# Patient Record
Sex: Male | Born: 1945
Health system: Southern US, Community
[De-identification: ages and names within clinical notes are randomized; demographics above are authoritative.]

## PROBLEM LIST (undated history)

## (undated) DIAGNOSIS — M1990 Unspecified osteoarthritis, unspecified site: Secondary | ICD-10-CM

## (undated) DIAGNOSIS — I10 Essential (primary) hypertension: Secondary | ICD-10-CM

## (undated) DIAGNOSIS — I2510 Atherosclerotic heart disease of native coronary artery without angina pectoris: Secondary | ICD-10-CM

## (undated) DIAGNOSIS — R42 Dizziness and giddiness: Secondary | ICD-10-CM

## (undated) DIAGNOSIS — I499 Cardiac arrhythmia, unspecified: Secondary | ICD-10-CM

## (undated) DIAGNOSIS — IMO0001 Reserved for inherently not codable concepts without codable children: Secondary | ICD-10-CM

## (undated) DIAGNOSIS — I739 Peripheral vascular disease, unspecified: Secondary | ICD-10-CM

## (undated) DIAGNOSIS — K219 Gastro-esophageal reflux disease without esophagitis: Secondary | ICD-10-CM

## (undated) DIAGNOSIS — C61 Malignant neoplasm of prostate: Secondary | ICD-10-CM

## (undated) DIAGNOSIS — E785 Hyperlipidemia, unspecified: Secondary | ICD-10-CM

## (undated) DIAGNOSIS — I6529 Occlusion and stenosis of unspecified carotid artery: Secondary | ICD-10-CM

## (undated) DIAGNOSIS — M75100 Unspecified rotator cuff tear or rupture of unspecified shoulder, not specified as traumatic: Secondary | ICD-10-CM

## (undated) DIAGNOSIS — Z87442 Personal history of urinary calculi: Secondary | ICD-10-CM

## (undated) DIAGNOSIS — I259 Chronic ischemic heart disease, unspecified: Secondary | ICD-10-CM

## (undated) HISTORY — DX: Personal history of urinary calculi: Z87.442

## (undated) HISTORY — DX: Hyperlipidemia, unspecified: E78.5

## (undated) HISTORY — DX: Essential (primary) hypertension: I10

## (undated) HISTORY — DX: Atherosclerotic heart disease of native coronary artery without angina pectoris: I25.10

## (undated) HISTORY — PX: HERNIA REPAIR: SHX51

## (undated) HISTORY — PX: APPENDECTOMY: SHX54

## (undated) HISTORY — DX: Chronic ischemic heart disease, unspecified: I25.9

## (undated) HISTORY — DX: Unspecified rotator cuff tear or rupture of unspecified shoulder, not specified as traumatic: M75.100

## (undated) HISTORY — DX: Occlusion and stenosis of unspecified carotid artery: I65.29

## (undated) HISTORY — PX: CAROTID ENDARTERECTOMY: SUR193

## (undated) HISTORY — DX: Reserved for inherently not codable concepts without codable children: IMO0001

## (undated) HISTORY — PX: SHOULDER SURGERY: SHX246

---

## 1999-10-21 ENCOUNTER — Emergency Department (HOSPITAL_COMMUNITY): Admission: EM | Admit: 1999-10-21 | Discharge: 1999-10-22 | Payer: Self-pay | Admitting: Emergency Medicine

## 1999-10-23 ENCOUNTER — Inpatient Hospital Stay (HOSPITAL_COMMUNITY): Admission: EM | Admit: 1999-10-23 | Discharge: 1999-10-26 | Payer: Self-pay | Admitting: Gastroenterology

## 1999-11-18 ENCOUNTER — Ambulatory Visit (HOSPITAL_COMMUNITY): Admission: RE | Admit: 1999-11-18 | Discharge: 1999-11-18 | Payer: Self-pay | Admitting: Gastroenterology

## 2000-04-14 ENCOUNTER — Emergency Department (HOSPITAL_COMMUNITY): Admission: EM | Admit: 2000-04-14 | Discharge: 2000-04-14 | Payer: Self-pay | Admitting: Emergency Medicine

## 2002-02-02 ENCOUNTER — Encounter (INDEPENDENT_AMBULATORY_CARE_PROVIDER_SITE_OTHER): Payer: Self-pay | Admitting: Specialist

## 2002-02-02 ENCOUNTER — Ambulatory Visit (HOSPITAL_COMMUNITY): Admission: RE | Admit: 2002-02-02 | Discharge: 2002-02-02 | Payer: Self-pay | Admitting: Gastroenterology

## 2002-04-26 HISTORY — PX: CORONARY ARTERY BYPASS GRAFT: SHX141

## 2002-05-02 ENCOUNTER — Encounter: Payer: Self-pay | Admitting: Cardiology

## 2002-05-02 ENCOUNTER — Inpatient Hospital Stay (HOSPITAL_COMMUNITY): Admission: AD | Admit: 2002-05-02 | Discharge: 2002-05-09 | Payer: Self-pay | Admitting: Cardiology

## 2002-05-03 ENCOUNTER — Encounter: Payer: Self-pay | Admitting: Cardiothoracic Surgery

## 2002-05-03 HISTORY — PX: CARDIAC CATHETERIZATION: SHX172

## 2002-05-04 ENCOUNTER — Encounter: Payer: Self-pay | Admitting: Cardiothoracic Surgery

## 2002-05-05 ENCOUNTER — Encounter: Payer: Self-pay | Admitting: Cardiothoracic Surgery

## 2002-05-06 ENCOUNTER — Encounter: Payer: Self-pay | Admitting: Cardiothoracic Surgery

## 2002-05-09 ENCOUNTER — Encounter: Payer: Self-pay | Admitting: Cardiothoracic Surgery

## 2002-05-31 ENCOUNTER — Encounter (HOSPITAL_COMMUNITY): Admission: RE | Admit: 2002-05-31 | Discharge: 2002-08-29 | Payer: Self-pay | Admitting: Cardiology

## 2002-08-12 HISTORY — PX: DOPPLER ECHOCARDIOGRAPHY: SHX263

## 2002-08-30 ENCOUNTER — Encounter (HOSPITAL_COMMUNITY): Admission: RE | Admit: 2002-08-30 | Discharge: 2002-11-28 | Payer: Self-pay | Admitting: Cardiology

## 2004-02-09 ENCOUNTER — Emergency Department (HOSPITAL_COMMUNITY): Admission: EM | Admit: 2004-02-09 | Discharge: 2004-02-09 | Payer: Self-pay | Admitting: Emergency Medicine

## 2004-03-13 ENCOUNTER — Encounter: Admission: RE | Admit: 2004-03-13 | Discharge: 2004-03-13 | Payer: Self-pay | Admitting: Family Medicine

## 2004-03-18 ENCOUNTER — Ambulatory Visit (HOSPITAL_COMMUNITY): Admission: RE | Admit: 2004-03-18 | Discharge: 2004-03-18 | Payer: Self-pay | Admitting: Urology

## 2004-06-25 ENCOUNTER — Ambulatory Visit (HOSPITAL_COMMUNITY): Admission: RE | Admit: 2004-06-25 | Discharge: 2004-06-25 | Payer: Self-pay | Admitting: Gastroenterology

## 2004-06-25 ENCOUNTER — Encounter (INDEPENDENT_AMBULATORY_CARE_PROVIDER_SITE_OTHER): Payer: Self-pay | Admitting: *Deleted

## 2006-08-10 HISTORY — PX: CARDIOVASCULAR STRESS TEST: SHX262

## 2008-09-23 ENCOUNTER — Inpatient Hospital Stay (HOSPITAL_COMMUNITY): Admission: AD | Admit: 2008-09-23 | Discharge: 2008-09-28 | Payer: Self-pay | Admitting: Internal Medicine

## 2008-09-23 ENCOUNTER — Encounter: Payer: Self-pay | Admitting: Emergency Medicine

## 2010-06-26 ENCOUNTER — Ambulatory Visit: Payer: Self-pay | Admitting: Cardiology

## 2010-12-27 ENCOUNTER — Ambulatory Visit (INDEPENDENT_AMBULATORY_CARE_PROVIDER_SITE_OTHER): Payer: BC Managed Care – PPO | Admitting: Cardiology

## 2010-12-27 DIAGNOSIS — Z79899 Other long term (current) drug therapy: Secondary | ICD-10-CM

## 2010-12-27 DIAGNOSIS — E119 Type 2 diabetes mellitus without complications: Secondary | ICD-10-CM

## 2010-12-27 DIAGNOSIS — E7800 Pure hypercholesterolemia, unspecified: Secondary | ICD-10-CM

## 2010-12-27 DIAGNOSIS — Z951 Presence of aortocoronary bypass graft: Secondary | ICD-10-CM

## 2011-03-11 NOTE — H&P (Signed)
NAMESTANISLAUS, Duke NO.:  [id number]   MEDICAL RECORD NO.:  [id number]          PATIENT TYPE:  INP   LOCATION:  1303                         FACILITY:  [facility]   PHYSICIAN:  Vania Rea, M.D. DATE OF BIRTH:  [birth year]   DATE OF ADMISSION:  09/24/2008  DATE OF DISCHARGE:                              HISTORY & PHYSICAL   PRIMARY CARE PHYSICIAN:  Marjory Lies, M.D., [facility]   CHIEF COMPLAINT:  Inflammation of the left forearm.   HISTORY OF PRESENT ILLNESS:  This is a 65 year old Caucasian gentleman  with a history of diabetes who visited Urgent Care on September 22, 2008  because of acute painful swelling of the left elbow. At the Urgent Care,  patient said he had fluid drawn from his left elbow and sent for  cultures. He says blood cultures were also done. He was given an  injection of antibiotics and sent home with Doxycycline. The follow day  Friday, he felt a lot better but on Saturday, his hand got significantly  worse with redness and swelling spreading up his left arm and down his  hand and forearm. The patient went to the [facility] emergency room  where he was seen and evaluated and found to have cellulitis of the left  upper extremity and the Hospitalist Service was called to assist with  admission. The patient denies any fevers or chills. He denies any  history of trauma to the area.   PAST MEDICAL HISTORY:  1. Diabetes type 2.  2. Hypertension.  3. Coronary artery disease, status post CABG 6 years ago.  4. Hyperlipidemia.   MEDICATIONS:  1. Lantus 42 units at bedtime.  2. Avandamet 4/100 twice daily.  3. Aspirin 81 mg daily.  4. Crestor 20 mg at bedtime.  5. Glyburide 10 mg twice daily.  6. HCTZ 12.5 mg daily.  7. Lotrel 5/20 daily.  8. Metoprolol 50 mg twice daily.  9. Nexium 40 mg daily.  10.Doxycycline 100 mg twice daily.   ALLERGIES:  CODEINE, SULFA.   SOCIAL HISTORY:  He used to smoke 1 pack per day and  did that for about  30 years but stopped after his CABG 6 1/2 years ago. Denies alcohol or  illicit drug use. 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   FAMILY HISTORY:  Coronary artery disease in both parents. Diabetes is  his sister.   PAST SURGICAL HISTORY:  1. Right carotid endarterectomy 14 years ago.  2. CABG 6 1/2 years ago.  3. Appendectomy at the age of 42.   REVIEW OF SYSTEMS:  Other than noted above, a 10 point review of systems  is unremarkable.   PHYSICAL EXAMINATION:  GENERAL:  A very pleasant middle aged Caucasian  gentleman who looks older than his stated age. No acute distress.  VITAL SIGNS:  Temperature 98.7, pulse 81, respiratory rate 18, blood  pressure 143/78. He is saturating at 97% on room air.  HEENT:  Pupils are equal and round. Mucous membranes are pink and  anicteric. He appears to be  mildly dehydration.  NECK:  He has no adenopathy, thyromegaly, or jugular venous distention.  He does have a right carotid endarterectomy scar.  CHEST:  Clear to auscultation bilaterally.  CARDIOVASCULAR:  Regular rhythm without murmur.  ABDOMEN:  Obese, soft, nontender. He has no masses.  EXTREMITIES:  Without edema. Dorsalis pedis pulses are 1+ and equal  bilaterally.  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 His left elbow, he has obvious swelling over the left  elbow, status post status post left olecranon bursitis. He has  inflammation on the inner aspect of the left arm, extending from the  elbow up to the mid arm. The inflammation also extends below the elbow  on the inner aspect of the forearm, down midway to the wrist, 2/3 way  down to the wrist.  NEUROLOGIC:  Cranial nerves 2-12 are grossly intact. He has no focal  neurological deficits.   LABORATORY DATA:  White count is normal at 9.0. In fact, his entire  serum chemistry is normal. He does not have granulocytes. Sodium 138,  potassium 3.4, BUN  26, creatinine 1.3, calcium 9.9.   DIAGNOSTIC STUDIES:  X-ray of the elbow shows swelling of the olecranon  bursa, consistent with bursitis. Calcification of the medial epicondyle,  consistent with chronic medial epicondylitis. No fracture.   ASSESSMENT:  1. Cellulitis of the left upper extremity.  2. Olecranon bursitis.  3. Diabetes mellitus type 2, controlled.  4. Hypertension, fair control.  5. History of hyperlipidemia.  6. Hypokalemia.   PLAN:  Will bring this gentleman in to continue intravenous antibiotics.  He has already had Vancomycin at the [facility] emergency room. He has  been on antibiotics for a couple of days. He is afebrile and without  leukocytes.  Further blood culture at this time will probably be of low yield. Will  add Ciprofloxacin and Clindamycin to his Vancomycin and will followup  cultures at the Urgent Care. Will discontinue Avandamet for the time  being. Add sliding scale to his regimen and Avandamet can likely be  restarted at discharge. Other plans as per order.      Vania Rea, M.D.  Electronically Signed     LC/MEDQ  D:  09/24/2008  T:  09/24/2008  Job:  [id number]   cc:   Marjory Lies, M.D.  Fax: [phone]

## 2011-03-11 NOTE — Discharge Summary (Signed)
[name], [name] NO.:  [id number]   MEDICAL RECORD NO.:  [id number]          PATIENT TYPE:  INP   LOCATION:  1303                         FACILITY:  [facility]   PHYSICIAN:  Monte Fantasia, MD  DATE OF BIRTH:  1946-05-04   DATE OF ADMISSION:  09/23/2008  DATE OF DISCHARGE:                               DISCHARGE SUMMARY   PRIMARY CARE PHYSICIAN:  Marjory Lies, M.D., [facility].   DISCHARGE DIAGNOSES:  1. Olecranon bursitis.  2. Left cellulitis.  3. Diabetes type 2.  4. Hypertension.  5. Coronary artery disease status post coronary artery bypass 6 years      ago.  6. Hyperlipidemia.   DISCHARGE MEDICATIONS:  1. Linezolid 600 mg p.o. every 12 hours for 2 weeks.  2. Norvasc 5 mg p.o. every day.  3. Aspirin 81 mg p.o. every day.  4. Benazepril/Lotensin 20 mg p.o. every day.  5. Glyburide 10 mg p.o. b.i.d.  6. Hydrochlorothiazide 12.5 mg p.o. every day.  7. Lantus 42 units subcutaneously nightly.  8. NovoLog 11 units subcutaneously t.i.d.  9. Metoprolol 50 mg p.o. b.i.d.  10.Protonix 40 mg p.o. every 12.  11.Lovastatin Crestor 20 mg p.o. every day.  12.Senna 1 tablet p.o. nightly.  13.Avandamet 400 mg 1 tab p.o. every b.i.d.   CHIEF COMPLAINT:  Inflammation of the left forearm.   COURSE DURING THE HOSPITAL STAY:  This 65 year old Caucasian gentleman  patient with a history of diabetes who presented to urgent care on  September 22, 2008, because of acute painful swelling of the left elbow.  At the urgent care, patient said that he had fluid drawn from his left  elbow and sent for cultures.  States that the blood cultures were also  done.  Also was given injection of antibiotics and sent home with  doxycycline.  Following there on Friday, he felt better, however, his  hand got significantly worse with redness and swelling spreading up  through his left arm down to his hand and forearm.  Patient went to the  [facility] Emergency Room for which  he was seen and evaluated and found  to have cellulitis of his left upper extremity and Hospitalists service  was called in to assist with admission.  Patient on admission was  started with IV antibiotics with vancomycin with ciprofloxacin and  clindamycin to his vancomycin.  Patient's blood cultures, however, has  been no growth for last 5 days and has symptomatically improved with  decrease in his swelling of his left forearm since admission gradually.  At present patient has no pain or swelling of his left forearm and has  been afebrile with normal white count.  Patient is stable and can be  discharged home.   PAST MEDICAL HISTORY:  1. Diabetes type 2.  2. Hypertension.  3. Coronary artery disease.  4. Hyperlipidemia.   ALLERGIES:  1. CODEINE.  2. SULFA.   SURGICAL HISTORY:  1. Right carotid endarterectomy 14 years ago.  2. Coronary artery bypass 6 1/2 years ago.  3. Appendectomy done at the age of 65.   INVESTIGATIONS  DONE DURING HOSPITAL STAY:  X-ray of the elbow done on  September 23, 2008.   IMPRESSION:  There is a swelling of the olecranon bursa consistent with  bursitis.  No fracture or dislocation, calcification or medial  epicondyle consistent with chronic medial epicondylitis.  1. Findings consistent with olecranon bursitis.  2. No evidence of fracture.  3. Chronic medial epicondylitis.   LABORATORIES ON DISCHARGE:  Total WBC 6.5, hemoglobin 13.6, hematocrit  14.9, platelet of 199.  Sodium 138, potassium 3.6, chloride 105, bicarb  is 36, glucose 153, BUN is 13, creatinine 0.9, calcium is 8.9.  Blood  cultures have been no growth for last 5 days.   ASSESSMENT AND PLAN:  Plan to discharge the patient home with p.o.  linezolid for olecranon bursitis.  Need to continue the p.o. antibiotic  for at least 2 weeks.  Will continue the rest of the medications as per  the discharge medications dictated as above.  The patient needs also to  follow up his primary care  physician within next week.      Monte Fantasia, MD  Electronically Signed     MP/MEDQ  D:  09/27/2008  T:  09/27/2008  Job:  [id number]   cc:   Marjory Lies, M.D.  Fax: [phone]

## 2011-03-11 NOTE — Discharge Summary (Signed)
[name], [name] NO.:  [id number]   MEDICAL RECORD NO.:  [id number]          PATIENT TYPE:  INP   LOCATION:  1303                         FACILITY:  [facility]   PHYSICIAN:  Monte Fantasia, MD  DATE OF BIRTH:  1946-09-08   DATE OF ADMISSION:  09/23/2008  DATE OF DISCHARGE:                               DISCHARGE SUMMARY   ADDENDUM:   PRIMARY CARE PHYSICIAN:  Marjory Lies, M.D., [facility].   DISCHARGE DIAGNOSES:  1. Olecranon bursitis.  2. Left elbow cellulitis.  3. Diabetes, type 2.  4. Hypertension.  5. Coronary artery disease status post coronary artery bypass 6 years      ago.  6. Hyperlipidemia.   DISCHARGE MEDICATIONS:  1. Levofloxacin 500 mg p.o. daily for 2 weeks.  2. Norvasc 5 mg p.o. daily.  3. Aspirin 81 mg p.o. daily.  4. Benazepril 20 mg p.o. daily.  5. Glyburide 10 mg p.o. b.i.d.  6. Hydrochlorothiazide 12.5 mg p.o. daily.  7. Lantus 42 units subcutaneously q.h.s.  8. NovoLog 11 units subcutaneous t.i.d. with meals.  9. Metoprolol 50 mg p.o. b.i.d.  10.Protonix 40 mg p.o. every 12 hours.  11.Lovastatin 20 mg p.o. daily.  12.Avandamet 4/100 mg 1 tablet p.o. b.i.d.   COURSE OF DISCHARGE DURING THE HOSPITAL STAY:  As dictated prior,  patient had had an aspiration of the olecranon bursa at the [facility]  Emergency Room which we had the culture report today.  The culture final  report states has a growth of Staph aureus sensitive to oxacillin 0.5,  cefazolin sensitivity, sensitive gentamicin, sensitive less than or  equal to 0.5, ciprofloxacin sensitive 1, levofloxacin sensitive 0.5,  moxifloxacin sensitive less than or equal to 0.25, trimethoprim sulfa  sensitive less than or equal to 10, vancomycin sensitive less than or  equal to 0.5, erythromycin resistant more than or equal to 8, linezolid  sensitive 4, quinupristin/dalfopristin sensitive less than or equal to  0.25, rifampin sensitive less than or equal to 0.5,  tetracycline  sensitive less than or equal to 1.   ASSESSMENT AND PLAN:  We will discharge the patient home with p.o.  levofloxacin as per the sensitivities for the olecranon bursitis for 2  weeks instead of the linezolid as dictated in the prior discharge  summary.  Patient has been counseled and has been requested to see his  primary care physician next week.  We will discharge the patient as per  the discharge medications as dictated above.      Monte Fantasia, MD  Electronically Signed     MP/MEDQ  D:  09/28/2008  T:  09/28/2008  Job:  [id number]   cc:   Marjory Lies, M.D.  Fax: [phone]

## 2011-03-14 NOTE — Consult Note (Signed)
[place]  Patient:    Connor, Duke Visit Number: [id number] MRN: [id number]          Service Type: MED Location: 2000 2030 01 Attending Physician:  [name] Dictated by:   [name], M.D. Proc. Date: 05/03/02 Admit Date:  05/02/2002   CC:         [name]., M.D.  [name], M.D., [facility]  CVTS Office  [facility]   Consultation Report  PHYSICIANS: Requesting physician: [name]., M.D. Primary care physician: [name], M.D.  REASON FOR CONSULTATION:  Left main and three-vessel coronary artery disease, unstable angina.  CHIEF COMPLAINT:  Chest pain and shortness of breath.  HISTORY OF PRESENT ILLNESS:  I was asked by Dr. Elease Duke to evaluate this 65 year old white male for potential surgical coronary revascularization for recently diagnosed left main and three-vessel coronary artery disease.  The patient has had exertional chest pain for several weeks, but this has recently progressed to resting angina and chest pain with minimal exertion associated with some lightheadedness, shortness of breath, and diaphoresis.  The patient was seen at the cardiology office yesterday where he had chest pain in the waiting room, and he was admitted.  Cardiac enzymes were drawn and were negative.  She had nonspecific ST segment changes, and he was placed on heparin and nitroglycerin.  [name] underwent cardiac catheterization by Dr. Elease Duke today which demonstrated a greater than 90% left main stenosis, 90% stenosis of the LAD diagonal, 80% stenosis of the right coronary artery, and an ejection fraction of 45%.  Left ventricular end-diastolic pressure was 30 mmHg.  Based on the patients bad coronary anatomy and symptoms of unstable angina, he was felt to be a potential candidate for surgical coronary revascularization.  PAST MEDICAL HISTORY: 1. Type 2  diabetes mellitus x 7 years. 2. Barretts esophagus. 3. Status post right carotid endarterectomy seven years ago. 4. Status post hernia repair and appendectomy. 5. History of kidney stones.  ALLERGIES:  He is allergic to SULFA and intolerant of CODEINE.  CURRENT MEDICATIONS: 1. Glucophage 500 mg t.i.d. 2. Lotrel 5/20 mg q.a.m. 3. Nexium 40 mg p.o. q.d. 4. Aspirin 1 q.d. 5. Plavix 75 mg daily.  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  He is single and has children.  He lives by himself.  He smokes one-half to one pack of cigarettes per day and denies alcohol use.  FAMILY HISTORY:  The patients father died of a myocardial infarction at age 55.  His mother died at age 33 of a myocardial infarction.  One brother has had coronary bypass surgery.  REVIEW OF SYSTEMS:  The patient denies any constitutional symptoms of weight loss, fever, or night sweats.  Pulmonary review is positive for mild intermittent productive cough, negative for history of recent pneumonia, bronchitis, hemoptysis, or chest trauma.  Cardiac review is positive for chest pain, dyspnea on exertion; negative for orthopnea, PND, or history of cardiac murmur or rheumatic fever as a child.  GI review is positive for Barretts esophagus controlled with Nexium, negative for peptic ulcer disease.  He has a history of diverticular disease and was admitted to hospital for blood per rectum three years ago without recent recurrence.  Urologic review is positive for 20 to 25 kidney stones being passed, the last of which was three years ago; negative for prostatism.  Skin review is negative for  skin cancer or chronic skin lesions.  He has no diabetic foot ulcers.  ENT review is negative for dizziness or change in vision.  He has had some diminished vision in the right eye prior to the right carotid endarterectomy.  This is now stable.  He has full dentures.  Vascular review is negative  for DVT, claudication. Musculoskeletal review is negative for fractures, significant arthritis, or gout.  Neurologic review is negative for seizure, stroke, or syncope.  PHYSICAL EXAMINATION:  VITAL SIGNS:  He is 5 feet 11 inches, weighs 171 pounds.  Blood pressure 110/60, heart rate 74 and regular.  GENERAL:  Middle-aged white male in his hospital room who appears to be somewhat sedated and slow to respond to questions.  He is surrounded by his family.  He is in no distress.  HEENT:  Normocephalic.  EOMs intact.  Full dental plates, upper and lower. Pharynx clear.  NECK:  Without JVD, thyromegaly.  He has a well-healed right carotid endarterectomy incision.  No bruit.  LUNGS:  Clear to auscultation.  There is no thoracic deformity.  CARDIAC:  Regular rate and rhythm without S3 gallop, murmur, or rub.  ABDOMEN:  Soft, nontender, nondistended, without organomegaly or abdominal bruit.  RECTAL:  Exam is deferred.  EXTREMITIES:  No clubbing, edema, cyanosis, or tenderness.  Peripheral pulses show 2+ femoral, 1+ pedal.  No venous insufficiency of the lower extremities.  NEUROLOGIC:  The patient is alert and oriented with full motor function.  LYMPHATIC:  No palpable adenopathy of the cervical, supraclavicular, or axillary regions.  DIAGNOSTIC DATA:  I reviewed his coronary arteriograms which show severe left main stenosis with three-vessel disease.  His hematocrit is 44%.  His creatinine is 0.8, blood sugar 186.  PT/PTT prior to heparin were normal.  His platelet count is 218,000.  IMPRESSION AND RECOMMENDATIONS:  A 65 year old male who has been admitted with unstable angina and found to have severe left main stenosis and three-vessel  disease.  He was placed on Plavix on admission.  He would benefit from surgical coronary revascularization.  We will plan on grafting the left anterior descending artery, diagonal,  obtuse marginal, and distal right coronary tomorrow  morning.  I have discussed the indications and expected benefits of coronary bypass surgery with the patient.  I reviewed the major alternatives to surgical therapy for treatment of his severe coronary disease and the expected outcome of those alternative therapies.  I discussed the risks associated with this operation with the patient including the risks of MI, CVA, bleeding, blood transfusion requirement, infection, and death.  I have discussed the major details of the proposed operation including the location of the surgical incisions, the choice of conduits for grafting, and the expected postoperative hospital recovery.  The patient understands these aspects of the procedure and agrees to proceed with the operation as planned under what I feel is an informed consent.  Thank you very much for this consultation. Dictated by:   [name], M.D. Attending Physician:  [name] DD:  05/03/02 TD:  05/03/02 Job: [id number]

## 2011-03-14 NOTE — Procedures (Signed)
[facility]  Patient:    [name], [name] Visit Number: [id number] MRN: [id number]          Service Type: END Location: ENDO Attending Physician:  Rich Brave Dictated by:   Florencia Reasons, M.D. Proc. Date: 02/02/02 Admit Date:  02/02/2002   CC:         Delorse Lek, M.D.   Procedure Report  PROCEDURE:  Upper endoscopy with biopsies.  INDICATIONS FOR PROCEDURE:  Follow-up of Barretts esophagus. Biopsies two years ago showed intestinal metaplasia without dysplasia.  FINDINGS:  A 3 cm segment of Barretts esophagus basically unchanged grossly in appearance from 2.5 years ago at the time of his last endoscopy. Moderate hiatal hernia. Erythematous duodenitis.  DESCRIPTION OF PROCEDURE:  The nature, purpose and risk of the procedure were familiar to the patient who provided written consent. Sedation was fentanyl 50 mcg and Versed 6 mg IV without arrhythmias or desaturation although his blood pressure ran a little bit high during the course of the procedure.  The Olympus standard video endoscope was passed under direct vision. The vocal cords were not well seen. The esophagus was readily entered.  The distal esophagus had a roughly 3-4 cm segment of Barretts esophagus above a 3 cm hiatal hernia. No ring, stricture or free reflux were noted and there was no evidence of active esophagitis, varices, infection or neoplasia. Distally the Barretts segment was basically circumferentially, more proximally, it had the classic "tongue" appearance. Multiple biopsies were obtained from the Barretts segment at the end of the procedure.  The stomach contained no significant residual and had normal mucosa without evidence of gastritis, erosions, ulcers, polyps or masses. A retroflexed view of the proximal stomach showed a somewhat patulous diaphragmatic hiatus.  The pylorus was normal. The duodenal bulb had a fair amount of mucosal hemorrhage or  erythema, without erosions or ulcerations. The second duodenum looked normal.  The scope was removed from the patient. He tolerated the procedure well and there were no apparent complications.  IMPRESSION:  1. Medium segment Barretts esophagus, biopsied.  2. Medium sized hiatal hernia.  3. Erythematous duodenitis probably secondary to aspirin exposure.  PLAN:  1. Await pathology on current biopsies.  2. The patient has been using Nexium sporadically rather than on a regular     basis over the past couple of years. I recommended to the patient that     he take it regularly, both because of the duodenitis and the fact     the patient is on aspirin, but also to at least theoretically decrease     the probability of his Barretts mucosa progressing toward dysplasia. Dictated by:   Florencia Reasons, M.D. Attending Physician:  Rich Brave DD:  02/02/02 TD:  02/03/02 Job: [id number]

## 2011-03-14 NOTE — Op Note (Signed)
[place]. [facility]  Patient:    [name], [name] Visit Number: [id number] MRN: [id number]          Service Type: MED Location: 2000 2011 01 Attending Physician:  Mikey Bussing Dictated by:   Mikey Bussing, M.D. Proc. Date: 05/04/02 Admit Date:  05/02/2002   CC:         CVTS Office  Alvia Grove., M.D., [facility]   Operative Report  PREOPERATIVE DIAGNOSES: 1. Class IV unstable angina. 2. Left main stenosis. 3. Severe three-vessel coronary disease.  POSTOPERATIVE DIAGNOSES: 1. Class IV unstable angina. 2. Left main stenosis. 3. Severe three-vessel coronary disease.  OPERATION:  Coronary artery bypass grafting x 5 (left internal mammary artery to the left anterior descending artery, saphenous vein graft to diagonal, saphenous vein graft to circumflex marginal, sequential saphenous vein graft to posterior descending artery and posterolateral branch of the right coronary artery).  SURGEON:  Mikey Bussing, M.D.  ASSISTANT:   Lissa Merlin, P.A.-C.  ANESTHESIA:  General by Dr. Bedelia Person.  INDICATIONS:  The patient is a 65 year old type 2 diabetic who presents with unstable angina.  Cardiac enzymes were negative.  Cardiac catheterization by Dr. Elease Hashimoto demonstrated critical left main stenosis with three-vessel disease and fairly well preserved left ventricular function.  He was felt to be a candidate for surgical coronary revascularization.  I examined the patient in his hospital room following cardiac catheterization and discussed the indications and expected benefits of coronary bypass grafting for treatment of his severe coronary artery disease.  I review with the patient and his family the alternatives to surgical treatment of his coronary artery disease and the expected outcomes of those alternative therapies.  I discussed with the patient the major aspects of the procedure including the location of  the surgical incisions, the  choice of conduit for grafting, the use of general anesthesia and cardiopulmonary bypass, and the expected postoperative hospital recovery.  I discussed with the patient and family the risks to him of coronary artery bypass surgery including those risks of MI, CVA, bleeding, wound infection, blood transfusion requirement, and death.  He understood these implications for the surgery and agreed to proceed with the operation under what I felt was an informed consent.  OPERATIVE FINDINGS:  The saphenous vein was of below-average quality.  It was not usable in the right thigh, and vein was harvested from the right lower leg.  Additional vein was harvested from the left lower leg as well.  The mammary artery as small but with excellent flow.  The coronaries had diffuse disease.  No blood products were used.  DESCRIPTION OF PROCEDURE:  The patient was brought to the operating room and placed supine on the operating room table where general anesthesia was induced under invasive hemodynamic monitoring.  The patient remained stable during anesthesia.  The chest was prepped and draped as a sterile field as was the abdomen and lower extremities.  A sternal incision was made as the saphenous vein was harvested from initially the right leg and then the left lower leg. The internal mammary artery was harvested on the left side as a pedicle graft from its origin at the subclavian vessels and was a good vessel with excellent flow.  Heparin was administered, and the ACT was documented as being therapeutic.  The sternal retractor was placed.  Pursestring was placed in the ascending aorta and right atrium.  The patient was cannulated and placed on bypass.  Cardioplegia cannulae were placed for both antegrade and retrograde delivery of cold blood cardioplegia.  The coronaries were identified for grafting, and the mammary artery and vein grafts were prepared the  distal anastomoses.  The patient was cooled to 28 degrees, and, as the aortic cross clamp was applied, 700 cc of cold blood cardioplegia was delivered in split doses between the antegrade and retrograde coronary sinus cardioplegia catheters.  There was a good cardioplegic arrest and septal temperature dropping less than 14 degrees.  Topical iced saline slush was used to augment myocardial preservation, and a pericardial insulator pad was used to protect the left phrenic nerve.  The distal coronary anastomoses were then performed.  The first distal anastomosis was of the diagonal.  This was a small 1.2 mm vessel with proximal 95% stenosis.  A reverse saphenous vein was sewn end-to-side with a running 7-0 Prolene with good flow through the graft.  The second distal anastomosis and third distal anastomosis consisted of a sequential vein graft to the posterior descending and posterolateral branch of the right coronary.  The posterior descending was a small 0.5 mm vessel with proximal 90% stenosis.  A side-to-side anastomosis with the vein was sewn using running 7-0 Prolene, and there was good flow through the graft.  The third distal anastomosis was a continuation of the saphenous vein to the posterolateral.  This was a 1.75 mm vessel with proximal 95% stenosis.  The end of the vein was sewn end-to-side with a running 7-0 Prolene with good flow through the graft.  Cardioplegia was redosed.  The fourth distal anastomosis was placed to the circumflex marginal. This was a diffusely diseased thickened vessel, 1.5 mm in diameter, and a reverse saphenous vein was sewn end-to-side with a running 7-0 Prolene.  This was a small vein, but there was adequate flow. The fifth distal anastomosis was placed to the mid portion of the LAD.  This was diffusely disease and thickened.  The left internal mammary artery was brought through an opening created in the left lateral pericardium and was brought down on  the LAD and  sewn end-to-side with a running 8-0 Prolene.  There was good flow through the anastomosis with immediate rise in septal temperature after release of the pedicle clamp on the mammary artery.  The mammary pedicle was secured in the epicardium, and the aortic crossclamp was removed.  The heart resumed a spontaneous rhythm.  Using a partial occluding clamp, three proximal vein anastomoses were placed on the ascending aorta using a 4.0 mm punch and running 6-0 Prolene.  The partial occluding clamp was removed, and the vein grafts were perfused.   Each had adequate flow, and hemostasis was documented at the proximal and distal sites.  The patient was rewarmed and reperfused.  Temporary pacing wires were applied.  When the patient reached 37 degrees, he was weaned from bypass without difficulty and without inotropes. Blood pressure in cardiac vessels remained stable.  Protamine was administered without adverse reaction.  The cannulae were removed.  The mediastinum was irrigated with warm antibiotic irrigation.  The leg incision was irrigated and closed in a standard fashion.  The pericardium was loosely reapproximated superiorly over the aorta and vein grafts.  Two mediastinal and a left pleural chest tube were placed and brought up through separate incisions.  The sternum was reapproximated with interrupted steel wire, and the pectoralis fascia was closed with interrupted #1 Vicryl.  The subcutaneous tissue and skin were closed with running Vicryl, and sterile  dressings were applied.  Total bypass time was 180 minutes with aortic cross clamp time of 80 minutes. Dictated by:   Mikey Bussing, M.D. Attending Physician:  Mikey Bussing DD:  05/04/02 TD:  05/08/02 Job: [id number]

## 2011-03-14 NOTE — Op Note (Signed)
NAME:  [name], [name]                           ACCOUNT NO.:  [id number]   MEDICAL RECORD NO.:  [id number]                   PATIENT TYPE:  AMB   LOCATION:  ENDO                                 FACILITY:  [facility]   PHYSICIAN:  Bernette Redbird, M.D.                DATE OF BIRTH:  09-Sep-1946   DATE OF PROCEDURE:  06/25/2004  DATE OF DISCHARGE:                                 OPERATIVE REPORT   PROCEDURE:  Upper endoscopy with biopsies.   INDICATIONS FOR PROCEDURE:  Followup of Barrett's esophagus, with biopsy two  years ago, indeterminate for dysplasia.   FINDINGS:  Moderate segment of Barrett's esophagus above small hiatal  hernia.   DESCRIPTION OF PROCEDURE:  The nature, purpose and risk of the procedure  were familiar to the patient from prior examination and he provided written  consent.  Sedation was fentanyl 50 mcg and Versed 5 mg IV without  arrhythmias or desaturation.  The Olympus small caliber adult video  endoscope was passed under direct vision, entering the esophagus without  significant difficulty. The vocal cords were not well seen during passage  but no frank laryngeal abnormalities were identified.   The proximal esophagus was normal but the distal 3-5 cm of the esophagus had  tons of Barrett's mucosa with a somewhat irregular fashion, no frank mass  effect, no varices, infection or stricture evident. No free reflux or active  esophagitis were noted. At the conclusion of the procedure, approximately 15  biopsies were obtained from the Barrett's-appearing mucosa.   A roughly 3 cm hiatal hernia was present.   The stomach contained a small clear slightly bile tinged residual which was  suctioned up.   There was minimal antral erythema, no erosions or ulcers, no polyps or  masses including a retroflexed view of the proximal stomach and the pylorus,  duodenal bulb and second duodenum looked normal.   The scope was removed from the patient who tolerated the procedure  well and  without apparent complications.   IMPRESSION:  Medium segment Barrett's esophagus, biopsy (530.85).   PLAN:  Await pathology results with endoscopic followup to depend on the  histologic findings.                                               Bernette Redbird, M.D.    RB/MEDQ  D:  06/25/2004  T:  06/25/2004  Job:  [id number]   cc:   Marjory Lies, M.D.  [address]  [place]  [place] [address]  Fax: [phone]

## 2011-03-14 NOTE — Procedures (Signed)
[place]. [facility]  Patient:    [name]                       MRN: [id number] Proc. Date: 11/18/99 Adm. Date:  [id number] Attending:  Rich Brave CC:         Delorse Lek, M.D.                           Procedure Report  PROCEDURE:  Colonoscopy.  INDICATIONS FOR PROCEDURE:  This is a 65 year old gentleman who is now several weeks status post hospitalization for large-volume hematochezia necessitating hospitalization.  The bleeding stopped spontaneously.  Endoscopy at that time was unrevealing and it was presumed to be a lower tract source of bleeding.  FINDINGS:  Extensive diverticulosis.  DESCRIPTION OF THE PROCEDURE:  The nature, purpose, and risks of the procedure ad been discussed with the patient and he had provided written consent.  Sedation was fentanyl 75 mcg and versed 7.5 mg IV without arrhythmias or desaturation.  Digital exam of the prostate was unremarkable.  The Olympus video colonoscope was advanced with considerable difficulty through a very "stiff," and somewhat angulated sigmoid region with a lot of muscular thickening, and then around some fairly tortuous angles in the midportion of the colon, ultimately reaching the rea just above the cecum allowing me to see the ileocecal valve clearly and to look  down into the cecum and allowing me to see essentially the entire cecal surface  area.  Pullback was then performed.  We had the patient in the supine position nd applied some external abdominal compression to reach the cecum.  The quality of the prep was very good and it is felt that all areas were adequately seen.  There was extensive diverticular disease with associated muscular thickening (mycosis) in the sigmoid region, but there were also some scattered diverticula in the more proximal sections of the colon.  No polyps, cancer, colitis, or vascular malformations were observed  in retroflexion and the rectum was unremarkable.  No biopsies were obtained.  The patient tolerated the procedure well and there were no apparent complications.  IMPRESSION:  Extensive diverticulosis, otherwise normal colonoscopy.  It is felt that the patients most recent hospitalization for GI bleeding was probably due o diverticular hemorrhage.  PLAN:  Plan consider screening flexible sigmoidoscopy in five years. DD:  11/18/99 TD:  11/19/99 Job: [id number]

## 2011-03-14 NOTE — Procedures (Signed)
[place]. [facility]  Patient:    [name]                       MRN: [id number] Proc. Date: 10/23/99 Adm. Date:  [id number] Attending:  Rich Brave CC:         Delorse Lek, M.D., [facility]                           Procedure Report  PROCEDURE:  Upper endoscopy with biopsies.  INDICATION:  A 65 year old with hematochezia who has been on daily aspirin.  ENDOSCOPIST:  Florencia Reasons, M.D.  FINDINGS:  No evident source of bleeding.  Barretts esophagus present.  DESCRIPTION OF PROCEDURE:  The nature, purpose, risks of the procedure had been  discussed with the patient who provided written consent.  Sedation was fentanyl  50 mcg and Versed 5 mg without arrhythmias (other than baseline sinus tachycardia), desaturation, or hypotension.  The Olympus video endoscope was passed under direct vision.  The vocal cords were not well seen.  The esophagus was quite easily entered.  Although the proximal esophagus was normal, the distal esophagus had a 3 cm segment of classic Barretts esophagus, circumferential but with tongue-like projections at the squamocolumnar junction.  Multiple biopsies were obtained from this area at the end of the procedure.  There was a widely patent esophageal ring at the region f the lower esophageal sphincter and below this was a 3 cm hiatal hernia.  The stomach was entered.  It contained no blood, coffee ground material, or residual fluid.  The gastric mucosa was normal without evidence of gastritis, erosions, ulcers, polyps, or masses.  A retroflex view of the proximal stomach showed a slightly patulous diaphragmatic hiatus.  The pylorus duodenum bulb and second duodenum looked normal.  It should be noted that there was no evidence of varices, infection, neoplasia, nor any Mallory-Weiss tear seen on this exam.  The scope was removed from the patient. He tolerated the procedure well,  and there were no apparent complications.  IMPRESSION: 1. Medium segment Barretts esophagus, biopsied. 2. Widely patent esophageal ring. 3. Medium sized hiatal hernia with widely patent patulous diaphragmatic hiatus.  PLAN:  Await pathology on biopsies.  He will probably need repeat biopsies in two years if intestinal metaplasia is present on todays specimens.  The patient will be admitted to the hospital for inpatient monitoring of his GI bleed which is presumed to be diverticular origin since he is known to have diverticulosis based on a previous barium enema. DD:  10/23/99 TD:  10/24/99 Job: [id number]

## 2011-03-14 NOTE — Discharge Summary (Signed)
[place]. [facility]  Patient:    [name]                       MRN: [id number] Adm. Date:  [id number] Disc. Date: [id number] Attending:  Rich Brave Dictator:   Burgess Amor, P.A. CC:         Delorse Lek, M.D., [facility]                           Discharge Summary  FINAL DIAGNOSES: 1. Barretts esophagus. 2. Hematochezia, large volume, presumably diverticular origin. 3. Hypertension. 4. Non-insulin-dependent diabetes. 5. Hypercholesterolemia.  CONSULTATIONS:  None.  COMPLICATIONS:  None.  PROCEDURES:  Upper endoscopy in order to rule out upper GI bleed, normal except for findings of Barretts esophagus.  LABORATORY DATA:  CBC obtained date of admission revealed white blood count of 11.2, a hemoglobin of 14.7, hematocrit of 41.1, and a platelet count of 287. During the course of his stay, the patients hemoglobin fell to a low of 11.9 and on the date of discharge he was at 12.1.  Additionally, the WBC decreased to 9.6 on date of discharge.  All other parameters remained normal during hospital stay. The patients PT on date of admission was normal at 13.5 with an INR of 1.1.  A CMET on date of admission revealed a sodium of 133, potassium 4.3, chloride 98, CO2 28, BUN 16, creatinine 0.8, glucose 186, calcium 9.6, total protein 7.0, albumin 3.7, AST 31, ALT 32, total bilirubin 0.4.  HOSPITAL COURSE:  As mentioned above, the patient was admitted due to large volume hematochezia.  He had obtained an unprepped flexible sigmoidoscopy in the office of [facility] earlier the date of admission.  The flexible sigmoidoscopy was unable to locate the source of the patients bleeding and additionally was unable to get above the source of the bleeding and, given the quantity of blood  that the patient was passing, hospital admission was obtained in order to further evaluate bleeding and to provide supportive  care.  On the date of admission, the patient was given an EGD, which was essentially normal, with the exception of Barretts esophagus.  During the course of his stay, he continued to pass several large loose bowel movements, which were significant for melena and clots. Within 24 hours after his admission, the patient was without complaints; however, he had had another very large melanic bowel movement.  During the course of his stay, he patient did not destabilize or report any dizziness; however, he did have borderline orthostatic vital signs.  After undergoing 24 hours without an additional bowel movement, the patient was discharged to home for further outpatient evaluation.  DISPOSITION:  The patient was discharged to home.  DISCHARGE MEDICATIONS:  Included medications prior to admission: 1. Glucophage 1500 mg b.i.d. 2. Lotrel 5/20 q.d.  ACTIVITIES/DIET:  He was discharged with no restrictions on his activity or diet.  FOLLOW-UP:  A followup office visit was scheduled with Dr. Matthias Hughs for October 31, 1999 at 3 p.m.  At that time, the prospect of a complete colonoscopy to further evaluate source of bleeding will be discussed.  CONDITION ON DISCHARGE:  Improved. DD:  11/25/99 TD:  11/26/99 Job: [id number]

## 2011-03-14 NOTE — H&P (Signed)
[place]. [facility]  Patient:    [name], [name] Visit Number: [id number] MRN: [id number]          Service Type: MED Location: 2000 2030 01 Attending Physician:  Rudean Hitt Dictated by:   Clovis Pu Patty Sermons, M.D. Admit Date:  05/02/2002   CC:         Delorse Lek, M.D. at [facility]  Vesta Mixer, Montez Hageman., M.D.   History and Physical  CHIEF COMPLAINT:  Chest pain.  HISTORY OF PRESENT ILLNESS:  This is a 65 year old married Caucasian gentleman who was admitted with chest pain.  He has had some vague discomfort for several months, but in the past few weeks, has had more clear-cut crescendo angina.  With effort, he experiences a tightness in the center of his chest, and from both elbows down to both hands, relieved by rest.  He saw Dr. Marjory Lies last week and was given a prescription for Nitrostat which he carries with him, but has not used it because he is concerned that it might cause him a headache.  The patient is diabetic.  PRESENT MEDICATIONS:  Include Glucovance 500 mg t.i.d., Lotrel 5/20 one daily, Nexium 40 mg daily for Barretts esophagus, and aspirin 325 mg daily.  The patient with the chest discomfort has had light nausea and diaphoresis.  He has had no prolonged chest discomfort.  FAMILY HISTORY:  Positive for coronary disease.  Father died at 19 of an MI. Mother died at 72 of an MI.  He has one brother who has had coronary bypass graft surgery.  The patient is married and has two children in good health.  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  He does not get any regular exercise other than work. He has been cutting back on smoking and is down to half-pack-a-day.  He does drink two to three cups of coffee a day.  He does not drink any alcohol.  PAST MEDICAL HISTORY:  Other medical problems and past medical history includes history of  kidney stones.  PAST SURGICAL HISTORY:  Previous surgery includes right carotid endarterectomy seven years ago in [place].  He has also had appendectomy and a hernia.  ALLERGIES:  CODEINE and SULFA.  REVIEW OF SYSTEMS:  The patient has been diabetic for approximately seven or eight years.  He has a history of hypercholesterolemia.  He has been tried on several statin drugs in the past, but was unable to tolerate any of them because of severe myalgias.  The patient was seen in our office today.  He had an episode of chest discomfort while in our waiting room waiting to be seen. The remainder of review of systems is negative in detail.  PHYSICAL EXAMINATION:  VITAL SIGNS:  Blood pressure 120/90, pulse 98, respirations are normal.  GENERAL APPEARANCE:  Revealed a well-developed and well-nourished gentleman in no distress.  SKIN:  Clear, warm, and dry.  HEAD AND NECK:  Fundi are normal.  Extraocular movements are full.  Sclerae clear.  Mouth and pharynx normal.  Jugular venous pressure normal.  Carotids with no bruits.  He has an old right carotid endarterectomy scar.  CHEST:  Clear to percussion and auscultation.  HEART:  Reveals no murmurs, gallops, or rub, but he does have a soft S4.  ABDOMEN:  Soft and nontender.  Liver and spleen not enlarged.  EXTREMITIES:  Femoral pulses are good.  Pedal pulses are present.  There is no phlebitis or edema.  NEUROLOGIC:  Exam physiologic.  The remainder of exam negative.  LABORATORY DATA:  His electrocardiogram showed nonspecific ST-T wave abnormalities.  IMPRESSION: 1. Chest pain consistent with unstable angina. 2. Diabetes mellitus on oral agents. 3. Hypertension. 4. History of hypercholesterolemia. 5. Essential hypertension.  DISPOSITION:  He is being admitted as an emergency patient to telemetry. Would start IV nitroglycerin and IV heparin.  He will be continued on aspirin and will begin beta blocker.  Will hold Glucovance in  anticipation of cardiac catheterization which is scheduled for Tuesday, May 03, 2002 by Dr. Elease Hashimoto. Dictated by:   Clovis Pu Patty Sermons, M.D. Attending Physician:  Rudean Hitt DD:  05/02/02 TD:  05/02/02 Job: [id number]

## 2011-03-14 NOTE — Cardiovascular Report (Signed)
[place]. [facility]  Patient:    [name], [name] Visit Number: [id number] MRN: [id number]          Service Type: MED Location: 2300 2307 01 Attending Physician:  Mikey Bussing Dictated by:  Alvia Grove., M.D. Proc. Date: 05/03/02 Admit Date:  05/02/2002   CC:         Thomas A. Patty Sermons, M.D.  Cardiac Catheterization Lab                        Cardiac Catheterization  INDICATIONS:  [name] is a 65 year old gentleman with a history of unstable angina starting yesterday.  He was admitted to the hospital for these symptoms of unstable angina and was brought to the catheterization lab for further evaluation.  DESCRIPTION OF PROCEDURE:  The right femoral artery was easily cannulated using the modified Seldinger technique.  HEMODYNAMICS:  The left ventricular pressure was 136/27 withan aortic pressure of 135/76.  ANGIOGRAPHY:  The left main coronary artery has a severe plaque rupture in the proximal segment.  This plaque rupture extends all the way from the aorta to practically the entire length of the left main.  This represents a 99% stenosis.  The left main is quite hazy.  The left anterior descending artery has a proximal 70% stenosis prior to the diagonal branch.  The mid LAD has another ruptured plaque which represents a 90% stenosis.  The distal LAD has minor luminal irregularities.  The diagonal branch has a 90% stenosis at its origin.  It is fairly normal for approximately 40 mm and then has diffuse 50-60% irregularities.  The left circumflex artery has a proximal 50-60% stenosis.  The obtuse marginal artery is unremarkable.  Theright coronary artery is large and dominant.  There is an ostial 50-60% stenosis.  This is followed by some minor luminal irregularities.  The distal RCA has a long 70-80% stenosis just prior to the takeoff of the posterior lateral and posterior descending arteries.  The PDA and posterior  lateral segment arteries are unremarkable.  The left subclavian artery and the left internal mammary artery are widely patent.  The left vertebral artery has a 30-40% stenosis in the proximal segment.  LEFT VENTRICULOGRAM:  The left ventriculogram was performed in a 30 ROA position.  It revealed anterior apical and inferior apical severe hypokinesis. The ejection fraction was around 40-45%.  There was no mitral regurgitation.  COMPLICATIONS:  None.  CONCLUSIONS: 1. Significant three vessel coronary artery disease with a significant left    main stenosis. 2. Moderately dilated end-diastolic pressure. 3. Mildly to moderately depressed left ventricular systolic function.  PLAN:  We will refer himto CVTS for coronary artery bypass grafting. Dictated by:   Alvia Grove., M.D. Attending Physician:  Mikey Bussing DD: 05/03/38 TD:  05/06/02 Job: [id number]

## 2011-03-14 NOTE — H&P (Signed)
[place]. [facility]  Patient:    [name]                       MRN: [id number] Adm. Date:  [id number] Attending:  Rich Brave Dictator:   Burgess Amor, P.A. CC:         Delorse Lek, M.D.                         History and Physical  DATE OF BIRTH:  26-Sep-1946 DD:  10/23/99 TD:  10/24/99 Job: [id number]

## 2011-03-14 NOTE — Discharge Summary (Signed)
[place]. [facility]  Patient:    [name], [name] Visit Number: [id number] MRN: [id number]          Service Type: MED Location: 2000 2011 01 Attending Physician:  Mikey Bussing Dictated by:   Tollie Pizza Collins, P.A.-C. Admit Date:  05/02/2002 Discharge Date: 05/09/2002   CC:         Thomas A. Patty Sermons, M.D.  Delorse Lek, M.D.   Discharge Summary  PRIMARY ADMITTING DIAGNOSES: 1. Left main and severe three vessel coronary artery disease. 2. Unstable angina.  ADDITIONAL DIAGNOSES: 1. Type 2 non-insulin-dependent diabetes mellitus. 2. Barretts esophagus. 3. History of right carotid endarterectomy. 4. History of kidney stones. 5. History of tobacco abuse. 6. Postoperative anemia.  PROCEDURES PERFORMED: 1. Cardiac catheterization. 2. Coronary artery bypass grafting x5 (left internal mammary artery to the    left anterior descending, saphenous vein graft to the diagonal, saphenous    vein graft to the circumflex, sequential saphenous vein graft to the    posterior descending and posterolateral arteries).  HISTORY:  The patient is a 65 year old white male who over the past several months prior to this admission has experienced intermittent chest pain mostly upon exertion.  For the past approximately two weeks his chest pain occurrences have been increasing in frequency now to the point where he is having both symptoms of pain at rest and with exertion.  He also experiences associated dyspnea and diaphoresis.  On the day of this admission he developed chest pain and was seen in the cardiologists office at which time he was transferred to the hospital for further evaluation and treatment.  HOSPITAL COURSE:  He was admitted and seen by Dr. Patty Sermons and Dr. Elease Hashimoto. He underwent cardiac catheterization on May 03, 2002 and was found to have severe three vessel coronary artery disease including significant left main disease.  Left ventricular  function was preserved with an EF of 50%.  It was felt that he would benefit from surgical revascularization.  He was seen by Dr. Donata Clay and was taken to the operating room on May 04, 2002 where he underwent CABG x5 with the above noted grafts.  He tolerated the procedure well and was transferred to the SICU in stable condition.  He was extubated shortly after surgery.  On postoperative day #1 he was hemodynamically stable and ready for transfer to the floor.  Postoperatively he has done well.  He has experienced a mild anemia with hemoglobin 8 and hematocrit 23.3.  He was started on iron supplementation.  He has been tolerating a regular diet and having normal bowel and bladder function.  He has been ambulating in the halls without difficulty.  He has been counseled regarding smoking cessation.  His incisions are healing well.  He has remained afebrile and all vital signs have been stable.  It is felt that if he continues to remain stable he will be ready for discharge home on May 09, 2002.  DISCHARGE MEDICATIONS: 1. Enteric-coated aspirin 325 mg q.d. 2. Nexium 40 mg q.d. 3. Glucovance 5/500 one tablet b.i.d. 4. Toprol XL 25 mg q.d. 5. Niferex 150 mg q.d. 6. Darvocet-N 100 one to two q.4h. p.r.n. for pain.  DISCHARGE INSTRUCTIONS:  He is to refrain from driving, heavy lifting, or strenuous activity.  He may continue a low fat, low sodium diet.  He is asked to shower daily and clean his incisions with soap and water.  DISCHARGE FOLLOWUP:  He is asked to make  an appointment to see Dr. Patty Sermons in the next two weeks and have a chest x-ray at that appointment.  The CVTS office will call with an appointment to see Dr. Donata Clay in three weeks.  He should bring his chest x-ray to this appointment for Dr. Vincent Gros review. He is also asked to consult his regular physician, Dr. Doristine Counter, for recheck of his blood sugars and titration of his antihyperglycemic medications.  Home health  nursing will be arranged for staple removal on Friday, May 13, 2002. He should call our office if he has any problems or has questions in the interim. Dictated by:   Tollie Pizza Collins, P.A.-C. Attending Physician:  Mikey Bussing DD:  05/08/02 TD:  05/10/02 Job: [id number]

## 2011-03-18 ENCOUNTER — Other Ambulatory Visit: Payer: Self-pay | Admitting: *Deleted

## 2011-03-18 MED ORDER — METOPROLOL TARTRATE 50 MG PO TABS: 50.0000 mg | ORAL_TABLET | Freq: Two times a day (BID) | ORAL | Status: DC

## 2011-03-18 NOTE — Telephone Encounter (Signed)
Refilled meds per fax request.  

## 2011-03-28 DIAGNOSIS — IMO0001 Reserved for inherently not codable concepts without codable children: Secondary | ICD-10-CM

## 2011-03-28 HISTORY — DX: Reserved for inherently not codable concepts without codable children: IMO0001

## 2011-04-14 ENCOUNTER — Encounter: Payer: Self-pay | Admitting: *Deleted

## 2011-04-14 ENCOUNTER — Other Ambulatory Visit: Payer: Self-pay | Admitting: *Deleted

## 2011-04-14 DIAGNOSIS — Z01818 Encounter for other preprocedural examination: Secondary | ICD-10-CM

## 2011-04-14 DIAGNOSIS — I10 Essential (primary) hypertension: Secondary | ICD-10-CM

## 2011-04-14 DIAGNOSIS — Z951 Presence of aortocoronary bypass graft: Secondary | ICD-10-CM

## 2011-04-14 DIAGNOSIS — E119 Type 2 diabetes mellitus without complications: Secondary | ICD-10-CM

## 2011-04-14 DIAGNOSIS — E7800 Pure hypercholesterolemia, unspecified: Secondary | ICD-10-CM

## 2011-04-14 NOTE — Progress Notes (Signed)
Surgical clearance pre op, workmans comp

## 2011-04-21 ENCOUNTER — Ambulatory Visit (HOSPITAL_COMMUNITY): Payer: Worker's Compensation | Attending: Cardiology | Admitting: Radiology

## 2011-04-21 DIAGNOSIS — E7800 Pure hypercholesterolemia, unspecified: Secondary | ICD-10-CM | POA: Insufficient documentation

## 2011-04-21 DIAGNOSIS — Z01818 Encounter for other preprocedural examination: Secondary | ICD-10-CM

## 2011-04-21 DIAGNOSIS — I2510 Atherosclerotic heart disease of native coronary artery without angina pectoris: Secondary | ICD-10-CM

## 2011-04-21 DIAGNOSIS — I10 Essential (primary) hypertension: Secondary | ICD-10-CM

## 2011-04-21 DIAGNOSIS — I25810 Atherosclerosis of coronary artery bypass graft(s) without angina pectoris: Secondary | ICD-10-CM

## 2011-04-21 DIAGNOSIS — Z951 Presence of aortocoronary bypass graft: Secondary | ICD-10-CM | POA: Insufficient documentation

## 2011-04-21 DIAGNOSIS — Z01810 Encounter for preprocedural cardiovascular examination: Secondary | ICD-10-CM | POA: Insufficient documentation

## 2011-04-21 DIAGNOSIS — E119 Type 2 diabetes mellitus without complications: Secondary | ICD-10-CM | POA: Insufficient documentation

## 2011-04-21 DIAGNOSIS — I252 Old myocardial infarction: Secondary | ICD-10-CM

## 2011-04-21 MED ORDER — REGADENOSON 0.4 MG/5ML IV SOLN
0.4000 mg | Freq: Once | INTRAVENOUS | Status: AC
  Administered 2011-04-21: 0.4 mg via INTRAVENOUS

## 2011-04-21 MED ORDER — TECHNETIUM TC 99M TETROFOSMIN IV KIT
11.0000 | PACK | Freq: Once | INTRAVENOUS | Status: AC | PRN
  Administered 2011-04-21: 11 via INTRAVENOUS

## 2011-04-21 MED ORDER — TECHNETIUM TC 99M TETROFOSMIN IV KIT
33.0000 | PACK | Freq: Once | INTRAVENOUS | Status: AC | PRN
  Administered 2011-04-21: 33 via INTRAVENOUS

## 2011-04-21 NOTE — Progress Notes (Signed)
[facility] SITE 3 NUCLEAR MED [address] [place] [address] [phone]  Cardiology Nuclear Med Study  [name] is a 65 y.o. male [id number] 1946-04-09   Nuclear Med Background Indication for Stress Test:  Evaluation for Ischemia, Surgical Clearance for shoulder History:  '03 CABG,'03 Heart Catheterization: EF 40-45%, Myocardial Infarction and '07 Myocardial Perfusion Study: NL EF 65% Cardiac Risk Factors: Carotid Disease, Family History - CAD, Hypertension, IDDM Type 2 and Lipids  Symptoms:  Palpitations   Nuclear Pre-Procedure Caffeine/Decaff Intake:  None NPO After: 9:00pm   Lungs:  clear IV 0.9% NS with Angio Cath:  20g  IV Site: R Hand  IV Started by:  Cathlyn Parsons, RN  Chest Size (in):  42 Cup Size: n/a  Height: 5\' 11"  (1.803 m)  Weight:  172 lb (78.019 kg)  BMI:  Body mass index is 23.99 kg/(m^2). Tech Comments:  Blood sugar 142 this am    Nuclear Med Study 1 or 2 day study: 1 day  Stress Test Type:  Eugenie Birks  Reading MD: Dietrich Pates, MD  Order Authorizing Provider:  T.Brackbill  Resting Radionuclide: Technetium 74m Tetrofosmin  Resting Radionuclide Dose: 11.0 mCi   Stress Radionuclide:  Technetium 35m Tetrofosmin  Stress Radionuclide Dose: 33.0 mCi           Stress Protocol Rest HR: 72 Stress HR: 96  Rest BP: 100/60 Stress BP: 116/60  Exercise Time (min): n/a METS: n/a   Predicted Max HR: 157 bpm % Max HR: 61.15 bpm Rate Pressure Product: 21308   Dose of Adenosine (mg):  n/a Dose of Lexiscan: 0.4 mg  Dose of Atropine (mg): n/a Dose of Dobutamine: n/a mcg/kg/min (at max HR)  Stress Test Technologist: Milana Na, EMT-P  Nuclear Technologist:  Domenic Polite, CNMT     Rest Procedure:  Myocardial perfusion imaging was performed at rest 45 minutes following the intravenous administration of Technetium 15m Tetrofosmin. Rest ECG: NSR with PVCS  Stress Procedure:  The patient received IV Lexiscan 0.4 mg over 15-seconds.   Technetium 82m Tetrofosmin injected at 30-seconds.  There were non specific changes and rare pvcs with Lexiscan.  Quantitative spect images were obtained after a 45 minute delay. Stress ECG: No significant change from baseline ECG  QPS Raw Data Images:  Images were motion corrected. Stress Images:  Normal homogeneous uptake in all areas of the myocardium. Rest Images:  Normal homogeneous uptake in all areas of the myocardium. Subtraction (SDS):  No evidence of ischemia. Transient Ischemic Dilatation (Normal <1.22):  0.97 Lung/Heart Ratio (Normal <0.45):  0.30  Quantitative Gated Spect Images QGS EDV:  70 ml QGS ESV:  25 ml QGS cine images:  NL LV Function; NL Wall Motion QGS EF: 64%  Impression Exercise Capacity:  Lexiscan with no exercise. BP Response:  Normal blood pressure response. Clinical Symptoms:  No chest pain. ECG Impression:  No significant ST segment change suggestive of ischemia. Comparison with Prior Nuclear Study: no previous report.  Overall Impression:  Normal stress nuclear study.

## 2011-04-22 ENCOUNTER — Telehealth: Payer: Self-pay | Admitting: Cardiology

## 2011-04-22 NOTE — Telephone Encounter (Signed)
CALLING FOR STRESS TEST RESULTS

## 2011-04-22 NOTE — Telephone Encounter (Signed)
Advised stress test results ok and faxed over signed clearance from Dr Patty Sermons

## 2011-04-22 NOTE — Progress Notes (Signed)
Nuc med report routed to Dr. Patty Sermons 04/22/11 [name]

## 2011-04-22 NOTE — Progress Notes (Signed)
Advised of stress test results 

## 2011-04-22 NOTE — Progress Notes (Signed)
Please report.  Nuclear stress test was normal.  No evidence of ischemia.  The patient is cleared for orthopedic surgery.     Cassell Clement

## 2011-04-23 ENCOUNTER — Encounter: Payer: Self-pay | Admitting: Cardiology

## 2011-04-25 ENCOUNTER — Telehealth: Payer: Self-pay | Admitting: Cardiology

## 2011-04-25 NOTE — Telephone Encounter (Signed)
Fax: [phone] OV, STRESS, EKG, ECHO

## 2011-04-29 ENCOUNTER — Ambulatory Visit
Admission: RE | Admit: 2011-04-29 | Discharge: 2011-04-29 | Disposition: A | Discharge status: Home / Self Care | Payer: Worker's Compensation | Source: Ambulatory Visit | Attending: Orthopedic Surgery | Admitting: Orthopedic Surgery

## 2011-04-29 ENCOUNTER — Encounter (HOSPITAL_BASED_OUTPATIENT_CLINIC_OR_DEPARTMENT_OTHER)
Admission: RE | Admit: 2011-04-29 | Discharge: 2011-04-29 | Disposition: A | Discharge status: Home / Self Care | Payer: Worker's Compensation | Source: Ambulatory Visit | Attending: Orthopedic Surgery | Admitting: Orthopedic Surgery

## 2011-04-29 ENCOUNTER — Other Ambulatory Visit: Payer: Self-pay | Admitting: Orthopedic Surgery

## 2011-04-29 DIAGNOSIS — Z01811 Encounter for preprocedural respiratory examination: Secondary | ICD-10-CM

## 2011-04-29 LAB — BASIC METABOLIC PANEL
CO2: 28 mEq/L (ref 19–32)
Calcium: 9.5 mg/dL (ref 8.4–10.5)
GFR calc non Af Amer: >60 mL/min (ref >60)
Glucose, Bld: 165 mg/dL — ABNORMAL HIGH (ref 70–99)
Potassium: 4.3 mEq/L (ref 3.5–5.1)

## 2011-05-01 ENCOUNTER — Ambulatory Visit (HOSPITAL_BASED_OUTPATIENT_CLINIC_OR_DEPARTMENT_OTHER)
Admission: RE | Admit: 2011-05-01 | Discharge: 2011-05-01 | Disposition: A | Discharge status: Home / Self Care | Payer: Worker's Compensation | Source: Ambulatory Visit | Attending: Orthopedic Surgery | Admitting: Orthopedic Surgery

## 2011-05-01 DIAGNOSIS — S46819A Strain of other muscles, fascia and tendons at shoulder and upper arm level, unspecified arm, initial encounter: Secondary | ICD-10-CM | POA: Insufficient documentation

## 2011-05-01 DIAGNOSIS — M25819 Other specified joint disorders, unspecified shoulder: Secondary | ICD-10-CM | POA: Insufficient documentation

## 2011-05-01 DIAGNOSIS — Z01818 Encounter for other preprocedural examination: Secondary | ICD-10-CM | POA: Insufficient documentation

## 2011-05-01 DIAGNOSIS — X58XXXA Exposure to other specified factors, initial encounter: Secondary | ICD-10-CM | POA: Insufficient documentation

## 2011-05-01 DIAGNOSIS — S43429A Sprain of unspecified rotator cuff capsule, initial encounter: Secondary | ICD-10-CM | POA: Insufficient documentation

## 2011-05-01 DIAGNOSIS — Z01810 Encounter for preprocedural cardiovascular examination: Secondary | ICD-10-CM | POA: Insufficient documentation

## 2011-05-01 DIAGNOSIS — Z01812 Encounter for preprocedural laboratory examination: Secondary | ICD-10-CM | POA: Insufficient documentation

## 2011-05-01 DIAGNOSIS — S43499A Other sprain of unspecified shoulder joint, initial encounter: Secondary | ICD-10-CM | POA: Insufficient documentation

## 2011-05-01 DIAGNOSIS — Y9269 Other specified industrial and construction area as the place of occurrence of the external cause: Secondary | ICD-10-CM | POA: Insufficient documentation

## 2011-05-01 DIAGNOSIS — Y998 Other external cause status: Secondary | ICD-10-CM | POA: Insufficient documentation

## 2011-05-01 LAB — GLUCOSE, CAPILLARY: Glucose-Capillary: 192 mg/dL — ABNORMAL HIGH (ref 70–99)

## 2011-05-01 LAB — POCT HEMOGLOBIN-HEMACUE: Hemoglobin: 16.8 g/dL (ref 13.0–17.0)

## 2011-05-19 ENCOUNTER — Other Ambulatory Visit: Payer: Self-pay | Admitting: Cardiology

## 2011-05-19 DIAGNOSIS — I119 Hypertensive heart disease without heart failure: Secondary | ICD-10-CM

## 2011-05-20 NOTE — Op Note (Signed)
NAMELANDEN, KNOEDLER NO.:  [id number]  MEDICAL RECORD NO.:  [id number]  LOCATION:  ST3NUCME                     FACILITY:  [facility]  PHYSICIAN:  Jones Broom, MD    DATE OF BIRTH:  1946/10/10  DATE OF PROCEDURE:  05/01/2011 DATE OF DISCHARGE:  04/21/2011                              OPERATIVE REPORT   PREOPERATIVE DIAGNOSIS:  Left shoulder rotator cuff tear and impingement.  POSTOPERATIVE DIAGNOSES: 1. Left shoulder rotator cuff tear. 2. Left shoulder impingement. 3. Left shoulder long head proximal biceps tendon tear and labral     tearing.  PROCEDURES PERFORMED: 1. Arthroscopic left shoulder rotator cuff repair. 2. Arthroscopic left shoulder subacromial decompression. 3. Arthroscopic left shoulder biceps tenotomy and extensive     debridement of labral tearing.  ATTENDING SURGEON:  Jones Broom, MD.  ASSISTANT:  Skip Mayer, PA-C Carlena Sax was essential in holding the camera and assisting in passing instrumentation).  ANESTHESIA:  GETA.  COMPLICATIONS:  None.  DRAINS:  None.  SPECIMENS:  None.  ESTIMATED BLOOD LOSS:  Minimal.  INDICATIONS FOR SURGERY:  The patient is a 65 year old gentleman who was injured at work in March.  He went on to have continued left shoulder pain and an MRI revealed retracted left rotator cuff tear.  He was indicated for surgery to decrease pain, increased function, and prevent increase in tear size.  He understood risks, benefits, and alternatives to the procedure including but not limited to risk of bleeding, infection, damage to neurovascular structures, risk of stiffness, nonhealing, and potential need for future surgery.  He elected to go forward with surgery.  OPERATIVE FINDINGS:  Examination under anesthesia demonstrated no stiffness or instability.  Diagnostic arthroscopy revealed extensive tearing of the superior labrum as well as the proximal biceps tendon involving greater than 50% of  the circumference; therefore, biceps tenotomy was performed.  The subscapularis had some partial tearing at the insertion but nothing that required repair.  He did have a large tear of the supraspinatus which was retracted.  The tendon quality overall was quite good and it did appear to be subacute tear with bleeding tendon edge as well as bleeding tuberosity.  There was some extensive synovitis in the joint.  Posterior rotator cuff was intact.  No loose bodies were noted.  The biceps tenotomy was carried out.  The rotator cuff was repaired in a double row fashion with two 5.5-mm BioComposite corkscrew anchors in the medial row and two 4.5-mm BioComposite PushLock anchors in a lateral row.  Excellent apposition of the tendon was noted after the procedure. No undue tension was noted on the repair.  He had a large anterior acromial spur which was taken down with standard acromioplasty.  PROCEDURE:  The patient was identified in the preoperative holding area where I personally marked the operative site after verifying site, side, and procedure with the patient.  He declined a nerve block.  He was taken back to the operating room where general anesthesia was induced without complication.  He did receive IV antibiotics prior to the procedure.  The appropriate time-out procedure was carried out.  He was placed in a beach-chair position with all extremities carefully padded in position.  The left upper extremity was prepped and draped in the standard sterile fashion, and a standard posterior portal was established.  An anterior portal was then established above the subscapularis with needle localization.  Diagnostic arthroscopy was then carried out with findings as described above.  The ArthroCare was used to perform biceps tenotomy proximally after he was noted to have extensive tearing of the proximal biceps.  The degenerative labral tears were all cleaned up and debrided with a combination  of shaver and ArthroCare.  The joint surfaces were healthy appearing.  No loose bodies were noted.  The arthroscope was introduced in the subacromial space and the bursa was debrided.  The tear edge was identified.  A small amount of scar tissue on the tear edge was debrided back to a bleeding base. The tuberosity was noted to be completely bare with no residual tendon. Collene Mares was used to stir up some bleeding on the tuberosity and the lateral portal was established for the repair with a large cannula.  A posterolateral portal was established for viewing.  The grasper was used from lateral portal to ensure that the tendon could be brought over to the tuberosity.  With some gentle releases beneath the tendon as well as posteriorly in the subdeltoid space, I was able to easily bring the tendon over to the prepared tuberosity and I felt that double row repair would be possible.  Therefore, the tendon was repaired using two 5.5-mm BioComposite PushLock anchors placed percutaneously off the lateral border of the acromion.  I sequentially passed these sutures from posterior to anterior.  The anterior anchor only required 1 suture while 2 sutures were used on the posterior anchor.  The medial row was then tied and brought the tendon nicely over the prepared tuberosity.  One strand from each knot was then used for posterior, lateral row anchor with a 4.5 PushLock.  The second strand was then used for an anterior 4.5 PushLock anchor laterally.  Excellent fixation was noted.  The tendon was draped nicely over the prepared tuberosity.  This was viewed from the posterior and lateral portals and felt to be excellent.  The camera was then placed again in the posterior portal and the coracoacromial ligament was taken down exposing a large underlying anterior acromial spur.  A 4-mm bur was used to perform an acromioplasty in standard fashion from the lateral portal.  The acromion was felt to be perfectly  flat on the undersurface anteriorly, and the arthroscope was then removed from the joint after using a shaver to remove some bone dust.  Portals then closed using 3-0 with nylon in interrupted fashion, and sterile dressings were applied including Xeroform, 4 x 4s, ABDs, and tape.  The patient was placed in a sling, allowed to awaken from general anesthesia, transferred to the stretcher, and taken to the recovery room in stable condition.  POSTOPERATIVE PLAN:  He will be discharged home unless there is any reason to keep him per Anesthesiology and the PACU.  He will follow up in 1 week.  He will follow a large tear protocol.     Jones Broom, MD   ______________________________ Jones Broom, MD    JC/MEDQ  D:  05/01/2011  T:  05/02/2011  Job:  [id number]  Electronically Signed by Jones Broom  on 05/20/2011 03:57:17 PM

## 2011-05-20 NOTE — Telephone Encounter (Signed)
escribe request  

## 2011-06-09 ENCOUNTER — Encounter: Payer: Self-pay | Admitting: Nurse Practitioner

## 2011-06-16 ENCOUNTER — Other Ambulatory Visit: Payer: Self-pay | Admitting: Cardiology

## 2011-06-16 DIAGNOSIS — I259 Chronic ischemic heart disease, unspecified: Secondary | ICD-10-CM

## 2011-06-16 DIAGNOSIS — I10 Essential (primary) hypertension: Secondary | ICD-10-CM

## 2011-06-16 DIAGNOSIS — E785 Hyperlipidemia, unspecified: Secondary | ICD-10-CM

## 2011-06-19 ENCOUNTER — Other Ambulatory Visit (INDEPENDENT_AMBULATORY_CARE_PROVIDER_SITE_OTHER): Payer: BC Managed Care – PPO | Admitting: *Deleted

## 2011-06-19 ENCOUNTER — Encounter: Payer: Self-pay | Admitting: Nurse Practitioner

## 2011-06-19 ENCOUNTER — Ambulatory Visit (INDEPENDENT_AMBULATORY_CARE_PROVIDER_SITE_OTHER): Payer: BC Managed Care – PPO | Admitting: Nurse Practitioner

## 2011-06-19 VITALS — BP 128/70 | HR 72 | Ht 70.5 in | Wt 178.0 lb

## 2011-06-19 DIAGNOSIS — E1159 Type 2 diabetes mellitus with other circulatory complications: Secondary | ICD-10-CM | POA: Insufficient documentation

## 2011-06-19 DIAGNOSIS — I2510 Atherosclerotic heart disease of native coronary artery without angina pectoris: Secondary | ICD-10-CM

## 2011-06-19 DIAGNOSIS — Z9889 Other specified postprocedural states: Secondary | ICD-10-CM

## 2011-06-19 DIAGNOSIS — I10 Essential (primary) hypertension: Secondary | ICD-10-CM

## 2011-06-19 DIAGNOSIS — E785 Hyperlipidemia, unspecified: Secondary | ICD-10-CM

## 2011-06-19 DIAGNOSIS — E1169 Type 2 diabetes mellitus with other specified complication: Secondary | ICD-10-CM | POA: Insufficient documentation

## 2011-06-19 DIAGNOSIS — Z951 Presence of aortocoronary bypass graft: Secondary | ICD-10-CM

## 2011-06-19 DIAGNOSIS — I259 Chronic ischemic heart disease, unspecified: Secondary | ICD-10-CM

## 2011-06-19 NOTE — Assessment & Plan Note (Signed)
He is doing well. He had remote CABG in 2003 and has had recent stress testing that was normal. No symptoms. Will see back in 6 months. Patient is agreeable to this plan and will call if any problems develop in the interim.

## 2011-06-19 NOTE — Patient Instructions (Signed)
I think from your heart standpoint you look good Keep up with your walking We will check your labs today We will see you back in 6 months Call for any problems.

## 2011-06-19 NOTE — Assessment & Plan Note (Signed)
Blood pressure is borderline. No medicines taken today. No change in his medicines.

## 2011-06-19 NOTE — Assessment & Plan Note (Signed)
Labs are checked today. Will forward to Dr. Chestine Spore.

## 2011-06-19 NOTE — Progress Notes (Signed)
[name] Date of Birth: 09/09/1946   History of Present Illness: [name] is seen back today for a follow up visit. He is seen for Dr. Patty Sermons. He is doing well from his cardiac status. He had rotator cuff surgery about 7 weeks ago. He is tired of staying home. He has started walking. No chest pain or shortness of breath. He is tolerating his medicines but did not take any today.   Current Outpatient Prescriptions on File Prior to Visit  Medication Sig Dispense Refill  . amLODipine-benazepril (LOTREL) 5-20 MG per capsule Take 1 capsule by mouth daily.        Marland Kitchen aspirin 81 MG tablet Take 81 mg by mouth daily.        Marland Kitchen esomeprazole (NEXIUM) 40 MG capsule Take 40 mg by mouth daily before breakfast.        . hydrochlorothiazide 25 MG tablet TAKE ONE-HALF TABLET BY MOUTH EVERY DAY.  100 tablet  3  . insulin glargine (LANTUS) 100 UNIT/ML injection Inject into the skin at bedtime.        . insulin lispro (HUMALOG) 100 UNIT/ML injection Inject into the skin 3 (three) times daily before meals.        . metoprolol (LOPRESSOR) 50 MG tablet Take 1 tablet (50 mg total) by mouth 2 (two) times daily.  180 tablet  3  . nitroGLYCERIN (NITROSTAT) 0.4 MG SL tablet Place 0.4 mg under the tongue every 5 (five) minutes as needed.        . rosuvastatin (CRESTOR) 20 MG tablet Take 20 mg by mouth daily.          Allergies  Allergen Reactions  . Codeine   . Lipitor (Atorvastatin Calcium)   . Sulfa Drugs Cross Reactors     Past Medical History  Diagnosis Date  . Hyperlipidemia   . Diabetes mellitus   . Hypertension   . IHD (ischemic heart disease)     Prior CABG in 2003  . History of kidney stones   . Torn rotator cuff   . Normal nuclear stress test June 2012    Past Surgical History  Procedure Date  . Cardiac catheterization 05/03/2002    EF 40-45%  . Coronary artery bypass graft 04/2002  . Carotid endarterectomy   . Appendectomy   . Hernia repair   . Cardiovascular stress test  08/10/2006    EF 65%  . Doppler echocardiography 08/12/2002    EF 80-85%    History  Smoking status  . Former Smoker  . Quit date: 10/27/2001  Smokeless tobacco  . Not on file    History  Alcohol Use No    Family History  Problem Relation Age of Onset  . Heart attack Mother   . Heart attack Father     Review of Systems: The review of systems is positive for recovering from shoulder surgery.  All other systems were reviewed and are negative.  Physical Exam: BP 128/70  Pulse 72  Ht 5' 10.5" (1.791 m)  Wt 178 lb (80.74 kg)  BMI 25.18 kg/m2 Patient is very pleasant and in no acute distress. Skin is warm and dry. Color is normal.  HEENT is unremarkable. Normocephalic/atraumatic. PERRL. Sclera are nonicteric. Neck is supple. No masses. No JVD. Lungs are clear. Cardiac exam shows a regular rate and rhythm. Abdomen is soft. Extremities are without edema. Gait and ROM are intact. No gross neurologic deficits noted.  LABORATORY DATA:   Assessment / Plan:

## 2011-07-29 LAB — CULTURE, BLOOD (ROUTINE X 2)

## 2011-07-29 LAB — DIFFERENTIAL
Eosinophils Absolute: 0.1
Eosinophils Relative: 1
Lymphs Abs: 1.8

## 2011-07-29 LAB — BASIC METABOLIC PANEL
BUN: 20 mg/dL (ref 6–23)
BUN: 26 — ABNORMAL HIGH
CO2: 24 mEq/L (ref 19–32)
CO2: 24
Chloride: 104 mEq/L (ref 96–112)
Chloride: 104
Creatinine, Ser: 1.02 mg/dL (ref 0.4–1.5)
Glucose, Bld: 237 mg/dL — ABNORMAL HIGH (ref 70–99)
Glucose, Bld: 267 — ABNORMAL HIGH
Potassium: 3.4 — ABNORMAL LOW

## 2011-07-29 LAB — GLUCOSE, CAPILLARY
Glucose-Capillary: 130 mg/dL — ABNORMAL HIGH (ref 70–99)
Glucose-Capillary: 145 mg/dL — ABNORMAL HIGH (ref 70–99)
Glucose-Capillary: 98 mg/dL (ref 70–99)

## 2011-07-29 LAB — CBC
HCT: 41
Hemoglobin: 12.9 g/dL — ABNORMAL LOW (ref 13.0–17.0)
MCV: 88.5
Platelets: 181 10*3/uL (ref 150–400)
Platelets: 194
RDW: 14.8 % (ref 11.5–15.5)
WBC: 9

## 2011-08-01 LAB — GLUCOSE, CAPILLARY
Glucose-Capillary: 191 mg/dL — ABNORMAL HIGH (ref 70–99)
Glucose-Capillary: 219 mg/dL — ABNORMAL HIGH (ref 70–99)
Glucose-Capillary: 89 mg/dL (ref 70–99)
Glucose-Capillary: 91 mg/dL (ref 70–99)

## 2011-08-01 LAB — BASIC METABOLIC PANEL
CO2: 26 mEq/L (ref 19–32)
Calcium: 8.9 mg/dL (ref 8.4–10.5)
Creatinine, Ser: 0.98 mg/dL (ref 0.4–1.5)
GFR calc Af Amer: >60 mL/min (ref >60)
Glucose, Bld: 153 mg/dL — ABNORMAL HIGH (ref 70–99)

## 2011-08-01 LAB — CBC
MCHC: 33.2 g/dL (ref 30.0–36.0)
Platelets: 199 10*3/uL (ref 150–400)
RBC: 4.56 MIL/uL (ref 4.22–5.81)
RDW: 14.4 % (ref 11.5–15.5)

## 2011-09-16 ENCOUNTER — Other Ambulatory Visit: Payer: Self-pay | Admitting: Cardiology

## 2011-10-08 ENCOUNTER — Ambulatory Visit (INDEPENDENT_AMBULATORY_CARE_PROVIDER_SITE_OTHER): Payer: BC Managed Care – PPO

## 2011-10-08 DIAGNOSIS — M7150 Other bursitis, not elsewhere classified, unspecified site: Secondary | ICD-10-CM

## 2011-10-08 DIAGNOSIS — M79609 Pain in unspecified limb: Secondary | ICD-10-CM

## 2011-12-18 ENCOUNTER — Encounter: Payer: Self-pay | Admitting: Cardiology

## 2011-12-18 ENCOUNTER — Ambulatory Visit (INDEPENDENT_AMBULATORY_CARE_PROVIDER_SITE_OTHER): Payer: BC Managed Care – PPO | Admitting: Cardiology

## 2011-12-18 VITALS — BP 146/72 | HR 64 | Resp 20 | Ht 71.0 in | Wt 186.4 lb

## 2011-12-18 DIAGNOSIS — E785 Hyperlipidemia, unspecified: Secondary | ICD-10-CM

## 2011-12-18 DIAGNOSIS — I2510 Atherosclerotic heart disease of native coronary artery without angina pectoris: Secondary | ICD-10-CM

## 2011-12-18 DIAGNOSIS — E119 Type 2 diabetes mellitus without complications: Secondary | ICD-10-CM

## 2011-12-18 DIAGNOSIS — E7800 Pure hypercholesterolemia, unspecified: Secondary | ICD-10-CM

## 2011-12-18 DIAGNOSIS — I10 Essential (primary) hypertension: Secondary | ICD-10-CM

## 2011-12-18 DIAGNOSIS — I119 Hypertensive heart disease without heart failure: Secondary | ICD-10-CM

## 2011-12-18 LAB — HEPATIC FUNCTION PANEL
Bilirubin, Direct: 0 mg/dL (ref 0.0–0.3)
Total Bilirubin: 0.5 mg/dL (ref 0.3–1.2)
Total Protein: 7.3 g/dL (ref 6.0–8.3)

## 2011-12-18 LAB — LIPID PANEL
Cholesterol: 123 mg/dL (ref 0–200)
HDL: 38.5 mg/dL — ABNORMAL LOW (ref >39.00)
LDL Cholesterol: 66 mg/dL (ref 0–99)
VLDL: 18.4 mg/dL (ref 0.0–40.0)

## 2011-12-18 LAB — BASIC METABOLIC PANEL
BUN: 12 mg/dL (ref 6–23)
Chloride: 105 mEq/L (ref 96–112)
Potassium: 4.6 mEq/L (ref 3.5–5.1)
Sodium: 139 mEq/L (ref 135–145)

## 2011-12-18 MED ORDER — NITROGLYCERIN 0.4 MG SL SUBL: 0.4000 mg | SUBLINGUAL_TABLET | SUBLINGUAL | Status: DC | PRN

## 2011-12-18 NOTE — Progress Notes (Signed)
[name] Date of Birth:  [birth year] [facility] [address] [place], [place]  [address] [phone]  Fax   [phone]  HPI:  this pleasant 66 year old gentleman is seen for a six-month followup office visit.  He has a past history of coronary artery disease and had coronary artery bypass graft surgery in 2003.  He has a history of high blood pressure, diabetes mellitus, and hyperlipidemia.  He had a normal nuclear stress test in June 2012 prior to rotator  cuff surgery.  His last visit he has been doing well with no chest pain or shortness of breath.  Current Outpatient Prescriptions  Medication Sig Dispense Refill  . amLODipine-benazepril (LOTREL) 5-20 MG per capsule Take 1 capsule by mouth daily.        Marland Kitchen aspirin 81 MG tablet Take 81 mg by mouth daily.        . CRESTOR 20 MG tablet TAKE ONE TABLET BY MOUTH EVERY DAY  30 each  6  . esomeprazole (NEXIUM) 40 MG capsule Take 40 mg by mouth daily before breakfast.        . hydrochlorothiazide 25 MG tablet TAKE ONE-HALF TABLET BY MOUTH EVERY DAY.  100 tablet  3  . insulin glargine (LANTUS) 100 UNIT/ML injection Inject into the skin at bedtime.        . insulin lispro (HUMALOG) 100 UNIT/ML injection Inject into the skin 3 (three) times daily before meals.        . metoprolol (LOPRESSOR) 50 MG tablet Take 1 tablet (50 mg total) by mouth 2 (two) times daily.  180 tablet  3  . nitroGLYCERIN (NITROSTAT) 0.4 MG SL tablet Place 1 tablet (0.4 mg total) under the tongue every 5 (five) minutes as needed.  25 tablet  3  . DISCONTD: nitroGLYCERIN (NITROSTAT) 0.4 MG SL tablet Place 0.4 mg under the tongue every 5 (five) minutes as needed.         Allergies  Allergen Reactions  . Codeine   . Lipitor (Atorvastatin Calcium)   . Sulfa Drugs Cross Reactors     Patient Active Problem List  Diagnoses  . CAD (coronary artery disease)  . HTN (hypertension)  . Hyperlipidemia  . Diabetes mellitus without mention of  complication    History  Smoking status  . Former Smoker  . Quit date: 10/27/2001  Smokeless tobacco  . Not on file    History  Alcohol Use No    Family History  Problem Relation Age of Onset  . Heart attack Mother   . Heart attack Father     Review of Systems: The patient denies any heat or cold intolerance.  No weight gain or weight loss.  The patient denies headaches or blurry vision.  There is no cough or sputum production.  The patient denies dizziness.  There is no hematuria or hematochezia.  The patient denies any muscle aches or arthritis.  The patient denies any rash.  The patient denies frequent falling or instability.  There is no history of depression or anxiety.  All other systems were reviewed and are negative.   Physical Exam: Filed Vitals:   12/18/11 0850  BP: 146/72  Pulse: 64  Resp: 20   The general appearance reveals a well-developed well-nourished gentleman in no distress.The head and neck exam reveals pupils equal and reactive.  Extraocular movements are full.  There is no scleral icterus.  The mouth and pharynx are normal.  The neck is supple.  The carotids  reveal no bruits.  The jugular venous pressure is normal.  The  thyroid is not enlarged.  There is no lymphadenopathy.  The chest is clear to percussion and auscultation.  There are no rales or rhonchi.  Expansion of the chest is symmetrical.  The precordium is quiet.  The first heart sound is normal.  The second heart sound is physiologically split.  There is no murmur gallop rub or click.  There is no abnormal lift or heave.  The abdomen is soft and nontender.  The bowel sounds are normal.  The liver and spleen are not enlarged.  There are no abdominal masses.  There are no abdominal bruits.  Extremities reveal good pedal pulses.  There is no phlebitis or edema.  There is no cyanosis or clubbing.  Strength is normal and symmetrical in all extremities.  There is no lateralizing weakness.  There are no sensory  deficits.  The skin is warm and dry.  There is no rash.     Assessment / Plan: Continue same medication.  Recheck in 6 months for followup office visit EKG and fasting lab work.  Blood work today is pending.

## 2011-12-18 NOTE — Assessment & Plan Note (Signed)
The patient has had no chest pain or angina pectoris.  He has not had to take any sublingual nitroglycerin.  He stays physically active.

## 2011-12-18 NOTE — Assessment & Plan Note (Signed)
His diabetes is followed by Dr. Chestine Spore.  He has not been having any severe hypoglycemic reactions.  His hemoglobin A1c is still above 9

## 2011-12-18 NOTE — Patient Instructions (Addendum)
Your physician wants you to follow-up in: 6 months.You will receive a reminder letter in the mail two months in advance. If you don't receive a letter, please call our office to schedule the follow-up appointment.  Your physician recommends that you return for lab work in: today (bmet, liver, lipid) and in 6 months (bmet, liver, lipid)

## 2011-12-18 NOTE — Progress Notes (Signed)
Quick Note:  Please report to patient. The recent labs are stable. Continue same medication and careful diet. The blood sugar is still high at 186. Continue strict diabetic diet ______

## 2011-12-18 NOTE — Assessment & Plan Note (Signed)
The patient has not been expressing any headaches or dizzy spells. 

## 2011-12-18 NOTE — Assessment & Plan Note (Signed)
The patient has a history of dyslipidemia and is on Crestor 20 mg daily.  Is not having any myalgias from Crestor.

## 2011-12-19 ENCOUNTER — Telehealth: Payer: Self-pay | Admitting: *Deleted

## 2011-12-19 NOTE — Telephone Encounter (Signed)
Mailed copy of labs and left message to call if any questions  

## 2011-12-19 NOTE — Telephone Encounter (Signed)
Message copied by Burnell Blanks on Fri Dec 19, 2011  1:35 PM ------      Message from: Cassell Clement      Created: Thu Dec 18, 2011  6:53 PM       Please report to patient.  The recent labs are stable. Continue same medication and careful diet.  The blood sugar is still high at 186.  Continue strict diabetic diet

## 2011-12-25 ENCOUNTER — Telehealth: Payer: Self-pay | Admitting: Cardiology

## 2011-12-25 NOTE — Telephone Encounter (Signed)
Left message

## 2011-12-25 NOTE — Telephone Encounter (Signed)
Patient would like a return call concerning lab results.  He can be reached at cell # [phone].

## 2011-12-25 NOTE — Telephone Encounter (Signed)
spoke with patient

## 2012-05-12 ENCOUNTER — Other Ambulatory Visit: Payer: Self-pay | Admitting: Cardiology

## 2012-05-12 NOTE — Telephone Encounter (Signed)
Refilled metoprolol 

## 2012-06-16 ENCOUNTER — Encounter: Payer: Self-pay | Admitting: Cardiology

## 2012-06-16 ENCOUNTER — Ambulatory Visit (INDEPENDENT_AMBULATORY_CARE_PROVIDER_SITE_OTHER): Payer: BC Managed Care – PPO | Admitting: Cardiology

## 2012-06-16 ENCOUNTER — Other Ambulatory Visit (INDEPENDENT_AMBULATORY_CARE_PROVIDER_SITE_OTHER): Payer: BC Managed Care – PPO

## 2012-06-16 VITALS — BP 138/80 | HR 63 | Ht 71.0 in | Wt 187.0 lb

## 2012-06-16 DIAGNOSIS — E7800 Pure hypercholesterolemia, unspecified: Secondary | ICD-10-CM

## 2012-06-16 DIAGNOSIS — E785 Hyperlipidemia, unspecified: Secondary | ICD-10-CM

## 2012-06-16 DIAGNOSIS — I119 Hypertensive heart disease without heart failure: Secondary | ICD-10-CM

## 2012-06-16 DIAGNOSIS — IMO0001 Reserved for inherently not codable concepts without codable children: Secondary | ICD-10-CM

## 2012-06-16 DIAGNOSIS — I2510 Atherosclerotic heart disease of native coronary artery without angina pectoris: Secondary | ICD-10-CM

## 2012-06-16 DIAGNOSIS — R0989 Other specified symptoms and signs involving the circulatory and respiratory systems: Secondary | ICD-10-CM

## 2012-06-16 LAB — BASIC METABOLIC PANEL
BUN: 19 mg/dL (ref 6–23)
CO2: 25 mEq/L (ref 19–32)
Chloride: 103 mEq/L (ref 96–112)
Glucose, Bld: 212 mg/dL — ABNORMAL HIGH (ref 70–99)
Potassium: 4.1 mEq/L (ref 3.5–5.1)

## 2012-06-16 LAB — HEPATIC FUNCTION PANEL
Bilirubin, Direct: 0 mg/dL (ref 0.0–0.3)
Total Bilirubin: 0.7 mg/dL (ref 0.3–1.2)

## 2012-06-16 NOTE — Progress Notes (Signed)
[name] Date of Birth:  24-Sep-1946 [facility] [address] [place], [place]  [address] [phone]  Fax   [phone]  HPI: this pleasant 66 year old gentleman is seen for a six-month followup office visit. He has a past history of coronary artery disease and had coronary artery bypass graft surgery in 2003. He has a history of high blood pressure, diabetes mellitus, and hyperlipidemia. He had a normal nuclear stress test in June 2012 prior to rotator cuff surgery. His last visit he has been doing well with no chest pain or shortness of breath.   Current Outpatient Prescriptions  Medication Sig Dispense Refill  . amLODipine-benazepril (LOTREL) 5-20 MG per capsule Take 1 capsule by mouth daily.        Marland Kitchen aspirin 81 MG tablet Take 81 mg by mouth daily.        . CRESTOR 20 MG tablet TAKE ONE TABLET BY MOUTH EVERY DAY  30 each  6  . esomeprazole (NEXIUM) 40 MG capsule Take 40 mg by mouth daily before breakfast.        . hydrochlorothiazide 25 MG tablet TAKE ONE-HALF TABLET BY MOUTH EVERY DAY.  100 tablet  3  . insulin glargine (LANTUS) 100 UNIT/ML injection Inject into the skin at bedtime.        . insulin lispro (HUMALOG) 100 UNIT/ML injection Inject into the skin 3 (three) times daily before meals.        . metoprolol (LOPRESSOR) 50 MG tablet TAKE ONE TABLET BY MOUTH TWICE DAILY  180 tablet  3  . nitroGLYCERIN (NITROSTAT) 0.4 MG SL tablet Place 1 tablet (0.4 mg total) under the tongue every 5 (five) minutes as needed.  25 tablet  3  . ONE TOUCH ULTRA TEST test strip as directed.        Allergies  Allergen Reactions  . Codeine   . Lipitor (Atorvastatin Calcium)   . Sulfa Drugs Cross Reactors     Patient Active Problem List  Diagnosis  . CAD (coronary artery disease)  . HTN (hypertension)  . Hyperlipidemia  . Diabetes mellitus without mention of complication    History  Smoking status  . Former Smoker  . Quit date: 10/27/2001  Smokeless tobacco    . Not on file    History  Alcohol Use No    Family History  Problem Relation Age of Onset  . Heart attack Mother   . Heart attack Father     Review of Systems: The patient denies any heat or cold intolerance.  No weight gain or weight loss.  The patient denies headaches or blurry vision.  There is no cough or sputum production.  The patient denies dizziness.  There is no hematuria or hematochezia.  The patient denies any muscle aches or arthritis.  The patient denies any rash.  The patient denies frequent falling or instability.  There is no history of depression or anxiety.  All other systems were reviewed and are negative.   Physical Exam: Filed Vitals:   06/16/12 0900  BP: 138/80  Pulse: 63   the general appearance reveals a well-developed well-nourished gentleman in no distress.The head and neck exam reveals pupils equal and reactive.  Extraocular movements are full.  There is no scleral icterus.  The mouth and pharynx are normal.  The neck is supple.  The carotids reveal no bruits.  The jugular venous pressure is normal.  The  thyroid is not enlarged.  There is no lymphadenopathy.  The  chest is clear to percussion and auscultation.  There are no rales or rhonchi.  Expansion of the chest is symmetrical.  The precordium is quiet.  The first heart sound is normal.  The second heart sound is physiologically split.  There is no murmur gallop rub or click.  There is no abnormal lift or heave.  The abdomen is soft and nontender.  The bowel sounds are normal.  The liver and spleen are not enlarged.  There are no abdominal masses.  There are no abdominal bruits.  Extremities reveal good pedal pulses.  There is no phlebitis or edema.  There is no cyanosis or clubbing.  Strength is normal and symmetrical in all extremities.  There is no lateralizing weakness.  There are no sensory deficits.  The skin is warm and dry.  There is no rash.   EKG today shows normal sinus rhythm with left anterior  fascicular block and occasional PVCs   Assessment / Plan: Continue on same medication.  Recheck in 6 months for followup office visit lipid panel hepatic function panel and basal metabolic panel

## 2012-06-16 NOTE — Assessment & Plan Note (Signed)
The patient is on Crestor 20 mg daily.  Is not having any myalgias or side effects.  Blood work is pending today

## 2012-06-16 NOTE — Patient Instructions (Addendum)
Will obtain labs today and call you with the results   Your physician recommends that you continue on your current medications as directed. Please refer to the Current Medication list given to you today.  Your physician wants you to follow-up in: 6 months with fasting labs (lp/bmet/hfp)  You will receive a reminder letter in the mail two months in advance. If you don't receive a letter, please call our office to schedule the follow-up appointment.  

## 2012-06-16 NOTE — Assessment & Plan Note (Signed)
His exercise tolerance is good.  He has not been experiencing any chest pain or shortness of breath.  No palpitations dizziness or syncope

## 2012-06-16 NOTE — Assessment & Plan Note (Signed)
The patient has been working hard to control his diabetes.  Dr. Chestine Spore is his diabetologist.  Patient has been avoiding bread and fresh corn this summer.  The patient has not been having any hypoglycemic episodes.

## 2012-06-21 NOTE — Progress Notes (Signed)
Do we have any results of the lipid panel?

## 2012-07-01 ENCOUNTER — Telehealth: Payer: Self-pay | Admitting: *Deleted

## 2012-07-01 NOTE — Telephone Encounter (Signed)
Message copied by Burnell Blanks on Thu Jul 01, 2012  6:15 PM ------      Message from: Cassell Clement      Created: Thu Jun 17, 2012  2:41 PM       Do we have the results of the lipid panel yet?

## 2012-07-01 NOTE — Telephone Encounter (Signed)
Advised patient of lab results  

## 2012-11-16 ENCOUNTER — Ambulatory Visit (HOSPITAL_COMMUNITY)
Admission: RE | Admit: 2012-11-16 | Discharge: 2012-11-16 | Disposition: A | Discharge status: Home / Self Care | Payer: Medicare Other | Source: Ambulatory Visit | Attending: Family Medicine | Admitting: Family Medicine

## 2012-11-16 ENCOUNTER — Other Ambulatory Visit (HOSPITAL_COMMUNITY): Payer: Self-pay | Admitting: Family Medicine

## 2012-11-16 DIAGNOSIS — R0602 Shortness of breath: Secondary | ICD-10-CM

## 2012-11-16 DIAGNOSIS — R079 Chest pain, unspecified: Secondary | ICD-10-CM | POA: Insufficient documentation

## 2012-11-16 DIAGNOSIS — M549 Dorsalgia, unspecified: Secondary | ICD-10-CM | POA: Insufficient documentation

## 2012-11-16 DIAGNOSIS — I2699 Other pulmonary embolism without acute cor pulmonale: Secondary | ICD-10-CM

## 2012-11-16 MED ORDER — IOHEXOL 350 MG/ML SOLN
60.0000 mL | Freq: Once | INTRAVENOUS | Status: AC | PRN
  Administered 2012-11-16: 60 mL via INTRAVENOUS

## 2013-01-24 ENCOUNTER — Encounter: Payer: Self-pay | Admitting: Cardiology

## 2013-01-24 ENCOUNTER — Ambulatory Visit (INDEPENDENT_AMBULATORY_CARE_PROVIDER_SITE_OTHER): Payer: Medicare Other | Admitting: Cardiology

## 2013-01-24 VITALS — BP 130/76 | HR 68 | Ht 70.0 in | Wt 183.4 lb

## 2013-01-24 DIAGNOSIS — I10 Essential (primary) hypertension: Secondary | ICD-10-CM

## 2013-01-24 DIAGNOSIS — E7800 Pure hypercholesterolemia, unspecified: Secondary | ICD-10-CM

## 2013-01-24 DIAGNOSIS — IMO0001 Reserved for inherently not codable concepts without codable children: Secondary | ICD-10-CM

## 2013-01-24 DIAGNOSIS — I2510 Atherosclerotic heart disease of native coronary artery without angina pectoris: Secondary | ICD-10-CM

## 2013-01-24 LAB — BASIC METABOLIC PANEL
Calcium: 9.2 mg/dL (ref 8.4–10.5)
GFR: 80.54 mL/min (ref >60.00)
Glucose, Bld: 182 mg/dL — ABNORMAL HIGH (ref 70–99)
Potassium: 3.8 mEq/L (ref 3.5–5.1)
Sodium: 137 mEq/L (ref 135–145)

## 2013-01-24 LAB — HEPATIC FUNCTION PANEL
AST: 25 U/L (ref 0–37)
Alkaline Phosphatase: 85 U/L (ref 39–117)
Bilirubin, Direct: 0.1 mg/dL (ref 0.0–0.3)
Total Bilirubin: 0.5 mg/dL (ref 0.3–1.2)

## 2013-01-24 LAB — LIPID PANEL
HDL: 28.7 mg/dL — ABNORMAL LOW (ref >39.00)
Total CHOL/HDL Ratio: 5
VLDL: 17.2 mg/dL (ref 0.0–40.0)

## 2013-01-24 MED ORDER — ROSUVASTATIN CALCIUM 20 MG PO TABS: 20.0000 mg | ORAL_TABLET | Freq: Every day | ORAL | Status: DC

## 2013-01-24 NOTE — Assessment & Plan Note (Signed)
His blood pressure has been staying stable on current therapy.  No dizziness or syncope.  No palpitations.  No shortness of breath or symptoms of CHF.

## 2013-01-24 NOTE — Progress Notes (Signed)
Margurite Auerbach Date of Birth:  [birth year] [facility] [address] [place], [place]  [address] [phone]  Fax   [phone]  HPI: this pleasant 67 year old gentleman is seen for a six-month followup office visit. He has a past history of coronary artery disease and had coronary artery bypass graft surgery in 2003. He has a history of high blood pressure, diabetes mellitus, and hyperlipidemia. He had a normal nuclear stress test in June 2012 prior to rotator cuff surgery. His last visit he has been doing well with no chest pain or shortness of breath.  Since last visit he has retired and has been enjoying retirement.  Current Outpatient Prescriptions  Medication Sig Dispense Refill  . amLODipine-benazepril (LOTREL) 5-20 MG per capsule Take 1 capsule by mouth daily.        Marland Kitchen aspirin 81 MG tablet Take 81 mg by mouth daily.        Marland Kitchen esomeprazole (NEXIUM) 40 MG capsule Take 40 mg by mouth daily before breakfast.        . hydrochlorothiazide 25 MG tablet TAKE ONE-HALF TABLET BY MOUTH EVERY DAY.  100 tablet  3  . insulin glargine (LANTUS) 100 UNIT/ML injection Inject into the skin at bedtime.        . insulin lispro (HUMALOG) 100 UNIT/ML injection Inject into the skin 3 (three) times daily before meals.        . metoprolol (LOPRESSOR) 50 MG tablet TAKE ONE TABLET BY MOUTH TWICE DAILY  180 tablet  3  . nitroGLYCERIN (NITROSTAT) 0.4 MG SL tablet Place 1 tablet (0.4 mg total) under the tongue every 5 (five) minutes as needed.  25 tablet  3  . ONE TOUCH ULTRA TEST test strip as directed.      . rosuvastatin (CRESTOR) 20 MG tablet Take 1 tablet (20 mg total) by mouth daily.  90 tablet  3   No current facility-administered medications for this visit.    Allergies  Allergen Reactions  . Codeine   . Lipitor (Atorvastatin Calcium)   . Sulfa Drugs Cross Reactors     Patient Active Problem List  Diagnosis  . CAD (coronary artery disease)  . HTN (hypertension)  .  Hyperlipidemia  . Type II or unspecified type diabetes mellitus without mention of complication, uncontrolled    History  Smoking status  . Former Smoker  . Quit date: 10/27/2001  Smokeless tobacco  . Not on file    History  Alcohol Use No    Family History  Problem Relation Age of Onset  . Heart attack Mother   . Heart attack Father     Review of Systems: The patient denies any heat or cold intolerance.  No weight gain or weight loss.  The patient denies headaches or blurry vision.  There is no cough or sputum production.  The patient denies dizziness.  There is no hematuria or hematochezia.  The patient denies any muscle aches or arthritis.  The patient denies any rash.  The patient denies frequent falling or instability.  There is no history of depression or anxiety.  All other systems were reviewed and are negative.   Physical Exam: Filed Vitals:   01/24/13 0913  BP: 130/76  Pulse: 68   the general appearance reveals a well-developed well-nourished gentleman in no distress.The head and neck exam reveals pupils equal and reactive.  Extraocular movements are full.  There is no scleral icterus.  The mouth and pharynx are normal.  The neck is  supple.  The carotids reveal no bruits.  The jugular venous pressure is normal.  The  thyroid is not enlarged.  There is no lymphadenopathy.  The chest is clear to percussion and auscultation.  There are no rales or rhonchi.  Expansion of the chest is symmetrical.  The precordium is quiet.  The first heart sound is normal.  The second heart sound is physiologically split.  There is no murmur gallop rub or click.  There is no abnormal lift or heave.  The abdomen is soft and nontender.  The bowel sounds are normal.  The liver and spleen are not enlarged.  There are no abdominal masses.  There are no abdominal bruits.  Extremities reveal good pedal pulses.  There is no phlebitis or edema.  There is no cyanosis or clubbing.  Strength is normal and  symmetrical in all extremities.  There is no lateralizing weakness.  There are no sensory deficits.  The skin is warm and dry.  There is no rash.      Assessment / Plan:  His weight is down 4 pounds.  Continue prudent diet.  We're checking blood work today fasting. Recheck in 6 months for followup office visit and fasting lab work.  Continue present medication

## 2013-01-24 NOTE — Assessment & Plan Note (Signed)
The patient has not had any recurrent chest pain or angina pectoris.  He has been walking for exercise.

## 2013-01-24 NOTE — Patient Instructions (Addendum)
Will obtain labs today and call you with the results (lp/bmet/hfp)  Your physician recommends that you continue on your current medications as directed. Please refer to the Current Medication list given to you today.  Your physician wants you to follow-up in: 6 months with fasting labs (lp/bmet/hfp)  You will receive a reminder letter in the mail two months in advance. If you don't receive a letter, please call our office to schedule the follow-up appointment.  

## 2013-01-24 NOTE — Progress Notes (Signed)
Quick Note:  Please report to patient. The recent labs are stable. Continue same medication and careful diet. BS better.  Send copy to Dr. Margaretmary Bayley and Dr, Marjory Lies ______

## 2013-01-24 NOTE — Assessment & Plan Note (Signed)
He has not been having any hypoglycemic episodes.  His diabetes is followed by Dr. Chestine Spore

## 2013-01-26 ENCOUNTER — Telehealth: Payer: Self-pay | Admitting: *Deleted

## 2013-01-26 NOTE — Telephone Encounter (Signed)
Message copied by Burnell Blanks on Wed Jan 26, 2013  2:17 PM ------      Message from: Cassell Clement      Created: Mon Jan 24, 2013  8:16 PM       Please report to patient.  The recent labs are stable. Continue same medication and careful diet. BS better.       Send copy to Dr. Margaretmary Bayley and Dr, Marjory Lies ------

## 2013-01-26 NOTE — Telephone Encounter (Signed)
Advised patient of lab results  

## 2013-02-21 ENCOUNTER — Encounter: Payer: Self-pay | Admitting: Cardiology

## 2013-07-08 ENCOUNTER — Ambulatory Visit (INDEPENDENT_AMBULATORY_CARE_PROVIDER_SITE_OTHER): Payer: Medicare Other | Admitting: Cardiology

## 2013-07-08 ENCOUNTER — Other Ambulatory Visit: Payer: Medicare Other

## 2013-07-08 ENCOUNTER — Encounter: Payer: Self-pay | Admitting: Cardiology

## 2013-07-08 VITALS — BP 137/73 | HR 62 | Ht 70.0 in | Wt 184.0 lb

## 2013-07-08 DIAGNOSIS — R0989 Other specified symptoms and signs involving the circulatory and respiratory systems: Secondary | ICD-10-CM | POA: Insufficient documentation

## 2013-07-08 DIAGNOSIS — I2510 Atherosclerotic heart disease of native coronary artery without angina pectoris: Secondary | ICD-10-CM

## 2013-07-08 DIAGNOSIS — E785 Hyperlipidemia, unspecified: Secondary | ICD-10-CM

## 2013-07-08 DIAGNOSIS — I739 Peripheral vascular disease, unspecified: Secondary | ICD-10-CM | POA: Insufficient documentation

## 2013-07-08 DIAGNOSIS — I119 Hypertensive heart disease without heart failure: Secondary | ICD-10-CM

## 2013-07-08 LAB — BASIC METABOLIC PANEL
CO2: 26 mEq/L (ref 19–32)
Chloride: 105 mEq/L (ref 96–112)
Creatinine, Ser: 0.8 mg/dL (ref 0.4–1.5)
Potassium: 3.8 mEq/L (ref 3.5–5.1)

## 2013-07-08 LAB — LIPID PANEL
HDL: 33.1 mg/dL — ABNORMAL LOW (ref >39.00)
Triglycerides: 113 mg/dL (ref 0.0–149.0)
VLDL: 22.6 mg/dL (ref 0.0–40.0)

## 2013-07-08 LAB — HEPATIC FUNCTION PANEL
ALT: 23 U/L (ref 0–53)
AST: 22 U/L (ref 0–37)
Albumin: 4 g/dL (ref 3.5–5.2)

## 2013-07-08 NOTE — Progress Notes (Signed)
Margurite Auerbach Date of Birth:  Dec 26, 1945 [facility] [address] [place], [place]  [address] [phone]         Fax   [phone]  History of Present Illness: this pleasant 67 year old gentleman is seen for a six-month followup office visit. He has a past history of coronary artery disease and had coronary artery bypass graft surgery in 2003.  He has a remote history of a right carotid endarterectomy about 18 years ago. He has a history of high blood pressure, diabetes mellitus, and hyperlipidemia. He had a normal nuclear stress test in June 2012 prior to rotator cuff surgery.  Since last visit he has been doing well with no chest pain or shortness of breath. Since last visit he has retired and has been enjoying retirement.  Since last visit he has developed right calf claudication.   Current Outpatient Prescriptions  Medication Sig Dispense Refill  . amLODipine-benazepril (LOTREL) 5-20 MG per capsule Take 1 capsule by mouth daily.        Marland Kitchen aspirin 81 MG tablet Take 81 mg by mouth daily.        Marland Kitchen esomeprazole (NEXIUM) 40 MG capsule Take 40 mg by mouth daily before breakfast.        . hydrochlorothiazide 25 MG tablet TAKE ONE-HALF TABLET BY MOUTH EVERY DAY.  100 tablet  3  . insulin glargine (LANTUS) 100 UNIT/ML injection Inject into the skin at bedtime.        . insulin lispro (HUMALOG) 100 UNIT/ML injection Inject into the skin 3 (three) times daily before meals.        . metFORMIN (GLUCOPHAGE-XR) 500 MG 24 hr tablet       . metoprolol (LOPRESSOR) 50 MG tablet TAKE ONE TABLET BY MOUTH TWICE DAILY  180 tablet  3  . nitroGLYCERIN (NITROSTAT) 0.4 MG SL tablet Place 1 tablet (0.4 mg total) under the tongue every 5 (five) minutes as needed.  25 tablet  3  . ONE TOUCH ULTRA TEST test strip as directed.      . rosuvastatin (CRESTOR) 20 MG tablet Take 1 tablet (20 mg total) by mouth daily.  90 tablet  3   No current facility-administered medications for this visit.     Allergies  Allergen Reactions  . Codeine   . Lipitor [Atorvastatin Calcium]   . Sulfa Drugs Cross Reactors     Patient Active Problem List   Diagnosis Date Noted  . Right leg claudication 07/08/2013  . Right carotid bruit 07/08/2013  . Type II or unspecified type diabetes mellitus without mention of complication, uncontrolled 12/18/2011  . CAD (coronary artery disease) 06/19/2011  . HTN (hypertension) 06/19/2011  . Hyperlipidemia 06/19/2011    History  Smoking status  . Former Smoker  . Quit date: 10/27/2001  Smokeless tobacco  . Not on file    History  Alcohol Use No    Family History  Problem Relation Age of Onset  . Heart attack Mother   . Heart attack Father     Review of Systems: Constitutional: no fever chills diaphoresis or fatigue or change in weight.  Head and neck: no hearing loss, no epistaxis, no photophobia or visual disturbance. Respiratory: No cough, shortness of breath or wheezing. Cardiovascular: No chest pain peripheral edema, palpitations. Gastrointestinal: No abdominal distention, no abdominal pain, no change in bowel habits hematochezia or melena. Genitourinary: No dysuria, no frequency, no urgency, no nocturia. Musculoskeletal:No arthralgias, no back pain, no gait disturbance or myalgias. Neurological:  No dizziness, no headaches, no numbness, no seizures, no syncope, no weakness, no tremors. Hematologic: No lymphadenopathy, no easy bruising. Psychiatric: No confusion, no hallucinations, no sleep disturbance.    Physical Exam: Filed Vitals:   07/08/13 0911  BP: 137/73  Pulse: 62   the general appearance reveals a well-developed well-nourished gentleman in no distress.The head and neck exam reveals pupils equal and reactive.  Extraocular movements are full.  There is no scleral icterus.  The mouth and pharynx are normal.  The neck is supple.  The carotids reveal an old right carotid endarterectomy scar and there is a faint overlying  bruit.  The jugular venous pressure is normal.  The  thyroid is not enlarged.  There is no lymphadenopathy.  The chest is clear to percussion and auscultation.  There are no rales or rhonchi.  Expansion of the chest is symmetrical.  The precordium is quiet.  The first heart sound is normal.  The second heart sound is physiologically split.  There is no murmur gallop rub or click.  There is no abnormal lift or heave.  The abdomen is soft and nontender.  The bowel sounds are normal.  The liver and spleen are not enlarged.  There are no abdominal masses.  There are no abdominal bruits.  Extremities reveal equivocal pulses in the right foot.  He has palpable femorals bilaterally without audible bruit in the femorals.  There is no phlebitis or edema.  There is no cyanosis or clubbing.  Strength is normal and symmetrical in all extremities.  There is no lateralizing weakness.  There are no sensory deficits.  The skin is warm and dry.  There is no rash.  EKG today shows normal sinus rhythm with occasional PVCs and a pattern of an old anteroseptal and old inferior wall myocardial infarction unchanged  Assessment / Plan: I have recommended that we obtain arterial Dopplers of his lower extremities.  He would like to delay this until after January 1 and that will be okay.  We will also update his carotid artery stenosis at that time. Lab work today pending.  Recheck for office visit and 6 months with fasting lipid panel hepatic function panel and basal metabolic panel at that time.

## 2013-07-08 NOTE — Assessment & Plan Note (Signed)
The patient has noted reversible claudication of his right calf when walking.  This has been present for 3-4 months.  He is able to walk about one city block and then he has to stop and rest.

## 2013-07-08 NOTE — Assessment & Plan Note (Signed)
The patient is on Crestor 20 mg daily.  We're checking lab work today.  Is not having any myalgias from the Crestor

## 2013-07-08 NOTE — Assessment & Plan Note (Signed)
The patient has not been experiencing any recurrent chest pain or angina. 

## 2013-07-08 NOTE — Assessment & Plan Note (Signed)
The patient denies any TIA symptoms related to his remote right carotid endarterectomy.

## 2013-07-08 NOTE — Progress Notes (Signed)
Quick Note:  Please report to patient. The recent labs are stable. Continue same medication and careful diet. ______ 

## 2013-07-08 NOTE — Patient Instructions (Addendum)
Will obtain labs today and call you with the results (lp/bmet/hfp)  Your physician has requested that you have a carotid duplex. This test is an ultrasound of the carotid arteries in your neck. It looks at blood flow through these arteries that supply the brain with blood. Allow one hour for this exam. There are no restrictions or special instructions.  Your physician has requested that you have a lower or upper extremity arterial duplex. This test is an ultrasound of the arteries in the legs or arms. It looks at arterial blood flow in the legs and arms. Allow one hour for Lower and Upper Arterial scans. There are no restrictions or special instructions  OK TO SCHEDULE DOPPLER WHEN CONVENIENT FOR YOU   Your physician recommends that you continue on your current medications as directed. Please refer to the Current Medication list given to you today.  Your physician wants you to follow-up in: 6 months with fasting labs (lp/bmet/hfp)  You will receive a reminder letter in the mail two months in advance. If you don't receive a letter, please call our office to schedule the follow-up appointment.

## 2013-07-14 ENCOUNTER — Telehealth: Payer: Self-pay | Admitting: *Deleted

## 2013-07-14 NOTE — Telephone Encounter (Signed)
Message copied by Burnell Blanks on Thu Jul 14, 2013 10:34 AM ------      Message from: Cassell Clement      Created: Fri Jul 08, 2013  9:36 PM       Please report to patient.  The recent labs are stable. Continue same medication and careful diet. ------

## 2013-07-14 NOTE — Telephone Encounter (Signed)
Mailed copy of labs and left message to call if any questions  

## 2013-07-15 ENCOUNTER — Encounter: Payer: Self-pay | Admitting: Vascular Surgery

## 2013-07-15 ENCOUNTER — Telehealth: Payer: Self-pay | Admitting: *Deleted

## 2013-07-15 ENCOUNTER — Encounter: Payer: Self-pay | Admitting: *Deleted

## 2013-07-15 ENCOUNTER — Ambulatory Visit: Payer: Medicare Other | Admitting: Cardiology

## 2013-07-15 ENCOUNTER — Encounter (INDEPENDENT_AMBULATORY_CARE_PROVIDER_SITE_OTHER): Payer: Medicare Other

## 2013-07-15 ENCOUNTER — Encounter (HOSPITAL_COMMUNITY): Payer: Self-pay | Admitting: Pharmacist

## 2013-07-15 ENCOUNTER — Other Ambulatory Visit: Payer: Self-pay | Admitting: *Deleted

## 2013-07-15 ENCOUNTER — Ambulatory Visit (INDEPENDENT_AMBULATORY_CARE_PROVIDER_SITE_OTHER): Payer: Medicare Other | Admitting: Vascular Surgery

## 2013-07-15 ENCOUNTER — Other Ambulatory Visit (INDEPENDENT_AMBULATORY_CARE_PROVIDER_SITE_OTHER): Payer: Medicare Other | Admitting: Vascular Surgery

## 2013-07-15 DIAGNOSIS — R0989 Other specified symptoms and signs involving the circulatory and respiratory systems: Secondary | ICD-10-CM

## 2013-07-15 DIAGNOSIS — I6529 Occlusion and stenosis of unspecified carotid artery: Secondary | ICD-10-CM

## 2013-07-15 DIAGNOSIS — I6522 Occlusion and stenosis of left carotid artery: Secondary | ICD-10-CM

## 2013-07-15 NOTE — Telephone Encounter (Signed)
Received telephone message from [facility] requesting surgical clearance. Will forward to  Dr. Patty Sermons

## 2013-07-15 NOTE — Progress Notes (Signed)
[facility]  New Carotid Patient  Referred by:  Delorse Lek, MD [address] [place], [place] [address]  Reason for referral: left carotid stenosis  History of Present Illness  [name] is a 67 y.o. (04/27/1946) male who presents with chief complaint: "tight left artery".  This patient previous has undergone R CEA >10 years prior.  On repeat carotid duplex today, he was found to have a high grade stenosis in the left internal carotid artery.  Patient has a history of R amaurosis fugax.  The patient has never had facial drooping or hemiplegia.  The patient has never had receptive or expressive aphasia.   The patient's previous neurologic deficits have resolved.  He is asx currently. The patient's risks factors for carotid disease include: DM, hyperlipidemia, HTN, and prior smoking.  Past Medical History  Diagnosis Date  . Hyperlipidemia   . Diabetes mellitus   . Hypertension   . IHD (ischemic heart disease)     Prior CABG in 2003  . History of kidney stones   . Torn rotator cuff   . Normal nuclear stress test June 2012  . CAD (coronary artery disease)     Past Surgical History  Procedure Laterality Date  . Cardiac catheterization  05/03/2002    EF 40-45%  . Coronary artery bypass graft  04/2002  . Carotid endarterectomy    . Appendectomy    . Hernia repair    . Cardiovascular stress test  08/10/2006    EF 65%  . Doppler echocardiography  08/12/2002    EF 80-85%  . Shoulder surgery      History   Social History  . Marital Status: Married    Spouse Name: N/A    Number of Children: N/A  . Years of Education: N/A   Occupational History  . Not on file.   Social History Main Topics  . Smoking status: Former Smoker    Quit date: 10/27/2001  . Smokeless tobacco: Not on file  . Alcohol Use: No  . Drug Use: No  . Sexual Activity: Not on file   Other Topics Concern  . Not on file   Social History Narrative  . No narrative on file     Family History  Problem Relation Age of Onset  . Heart attack Mother   . Diabetes Mother   . Heart disease Mother   . Hypertension Mother   . Heart attack Father   . Heart disease Father   . Hypertension Father   . Diabetes Brother   . Diabetes Sister   . Diabetes Daughter   . Heart disease Daughter   . Hypertension Daughter   . Diabetes Sister    Current Outpatient Prescriptions on File Prior to Visit  Medication Sig Dispense Refill  . amLODipine-benazepril (LOTREL) 5-20 MG per capsule Take 1 capsule by mouth daily.        Marland Kitchen aspirin 81 MG tablet Take 81 mg by mouth daily.        Marland Kitchen esomeprazole (NEXIUM) 40 MG capsule Take 40 mg by mouth daily before breakfast.        . hydrochlorothiazide 25 MG tablet TAKE ONE-HALF TABLET BY MOUTH EVERY DAY.  100 tablet  3  . insulin glargine (LANTUS) 100 UNIT/ML injection Inject into the skin at bedtime.        . insulin lispro (HUMALOG) 100 UNIT/ML injection Inject into the skin 3 (three) times daily before meals.        Marland Kitchen  metFORMIN (GLUCOPHAGE-XR) 500 MG 24 hr tablet       . metoprolol (LOPRESSOR) 50 MG tablet TAKE ONE TABLET BY MOUTH TWICE DAILY  180 tablet  3  . nitroGLYCERIN (NITROSTAT) 0.4 MG SL tablet Place 1 tablet (0.4 mg total) under the tongue every 5 (five) minutes as needed.  25 tablet  3  . ONE TOUCH ULTRA TEST test strip as directed.      . rosuvastatin (CRESTOR) 20 MG tablet Take 1 tablet (20 mg total) by mouth daily.  90 tablet  3   No current facility-administered medications on file prior to visit.    Allergies  Allergen Reactions  . Codeine   . Lipitor [Atorvastatin Calcium]   . Sulfa Drugs Cross Reactors     REVIEW OF SYSTEMS:  (Positives checked otherwise negative)  CARDIOVASCULAR:  []  chest pain, []  chest pressure, []  palpitations, []  shortness of breath when laying flat, []  shortness of breath with exertion,  [] x pain in feet when walking, []  pain in feet when laying flat, []  history of blood clot in veins  (DVT), []  history of phlebitis, []  swelling in legs, []  varicose veins  PULMONARY:  []  productive cough, []  asthma, []  wheezing  NEUROLOGIC:  [x]  weakness in arms or legs, [x]  numbness in arms or legs, []  difficulty speaking or slurred speech, []  temporary loss of vision in one eye, [x]  dizziness  HEMATOLOGIC:  []  bleeding problems, []  problems with blood clotting too easily  MUSCULOSKEL:  []  joint pain, []  joint swelling  GASTROINTEST:  []  vomiting blood, []  blood in stool     GENITOURINARY:  []  burning with urination, []  blood in urine  PSYCHIATRIC:  []  history of major depression  INTEGUMENTARY:  []  rashes, []  ulcers  CONSTITUTIONAL:  []  fever, []  chills  For VQI Use Only  PRE-ADM LIVING: Home  AMB STATUS: Ambulatory  CAD Sx: None  PRIOR CHF: None  STRESS TEST: [x]  No, [ ]  Normal, [ ]  + ischemia, [ ]  + MI, [ ]  Both  Physical Examination  Filed Vitals:   07/15/13 1415 07/15/13 1421  BP: 142/83 134/72  Pulse: 65   Height: 5\' 10"  (1.778 m)   Weight: 188 lb 3.2 oz (85.367 kg)   SpO2: 97%    Body mass index is 27 kg/(m^2).  General: A&O x 3, WDWN  Head: [place]/AT  Ear/Nose/Throat: Hearing grossly intact, nares w/o erythema or drainage, oropharynx w/o Erythema/Exudate, Mallampati score: 3  Eyes: PERRLA, EOMI  Neck: Supple, no nuchal rigidity, no palpable LAD  Pulmonary: Sym exp, good air movt, CTAB, no rales, rhonchi, & wheezing  Cardiac: RRR, Nl S1, S2, no Murmurs, rubs or gallops  Vascular: Vessel Right Left  Radial Palpable Palpable  Brachial Palpable Palpable  Carotid Palpable, without bruit Palpable, without bruit  Aorta Not palpable N/A  Femoral Palpable Palpable  Popliteal Not palpable Not palpable  PT Palpable Palpable  DP Palpable Palpable   Gastrointestinal: soft, NTND, -G/R, - HSM, - masses, - CVAT B  Musculoskeletal: M/S 5/5 throughout , Extremities without ischemic changes   Neurologic: CN 2-12 intact , Pain and light touch intact in  extremities , Motor exam as listed above  Psychiatric: Judgment intact, Mood & affect appropriate for pt's clinical situation  Dermatologic: See M/S exam for extremity exam, no rashes otherwise noted  Lymph : No Cervical, Axillary, or Inguinal lymphadenopathy   Non-Invasive Vascular Imaging  L CAROTID DUPLEX (Date: 07/15/2013):   L ICA stenosis: 80-99%  Mid-level bifurcation  Ordered  and finalized by myself.  Outside Studies/Documentation 3 pages of outside documents were reviewed including: outside cardiology chart.  Medical Decision Making  [name] is a 67 y.o. male who presents with: asx L ICA stenosis 80-99%., s/p prior R CEA   Based on the patient's vascular studies and examination, I have offered the patient: L CEA. I discussed with the patient the risks, benefits, and alternatives to carotid endarterectomy.   I discussed the procedural details of carotid endarterectomy with the patient.   The patient is aware that the risks of carotid endarterectomy include but are not limited to: bleeding, infection, stroke, myocardial infarction, death, cranial nerve injuries both temporary and permanent, neck hematoma, possible airway compromise, labile blood pressure post-operatively, cerebral hyperperfusion syndrome, and possible need for additional interventions in the future.  The patient is aware of the risks and agrees to proceed forward with the procedure.  I discussed in depth with the patient the nature of atherosclerosis, and emphasized the importance of maximal medical management including strict control of blood pressure, blood glucose, and lipid levels, obtaining regular exercise, antiplatelet agents, and cessation of smoking.   The patient is currently on a statin: Crestor.  The patient is currently on an anti-platelet: ASA.  The patient is aware that without maximal medical management the underlying atherosclerotic disease process will progress, limiting the benefit  of any interventions.  We will also get preop clearance from Dr. Patty Sermons to aid with risk stratification in this patient.  Thank you for allowing [place] to participate in this patient's care.  Leonides Sake, MD [facility] Office: [phone] Pager: [phone]  07/15/2013, 4:19 PM

## 2013-07-15 NOTE — Telephone Encounter (Signed)
Will forward to Oswaldo Done and Dr Imogene Burn

## 2013-07-15 NOTE — Telephone Encounter (Signed)
Yes, the patient is cleared for carotid surgery from the cardiac standpoint.

## 2013-07-15 NOTE — Telephone Encounter (Signed)
[name] came in for his carotid duplex today.  It shows critical narrowing of the left internal carotid artery between 80 and 99%.  He has had a history of right parotidectomy about 18 years ago in [place].  He has been on long-term aspirin because of his coronary artery disease.  With his severe asymptomatic left internal carotid stenosis we have referred him to Dr. Imogene Burn who will see him at [facility] at 3:00 today.  The patient also has significant claudication of the right leg which is of secondary importance to his carotid artery stenosis and will be worked up at a later date by [facility]

## 2013-07-19 ENCOUNTER — Encounter (HOSPITAL_COMMUNITY)
Admission: RE | Admit: 2013-07-19 | Discharge: 2013-07-19 | Disposition: A | Discharge status: Home / Self Care | Payer: Medicare Other | Source: Ambulatory Visit | Attending: Vascular Surgery | Admitting: Vascular Surgery

## 2013-07-19 ENCOUNTER — Encounter (HOSPITAL_COMMUNITY): Payer: Self-pay

## 2013-07-19 ENCOUNTER — Ambulatory Visit (HOSPITAL_COMMUNITY)
Admission: RE | Admit: 2013-07-19 | Discharge: 2013-07-19 | Disposition: A | Discharge status: Home / Self Care | Payer: Medicare Other | Source: Ambulatory Visit | Attending: Vascular Surgery | Admitting: Vascular Surgery

## 2013-07-19 DIAGNOSIS — Z01818 Encounter for other preprocedural examination: Secondary | ICD-10-CM | POA: Insufficient documentation

## 2013-07-19 DIAGNOSIS — Z01812 Encounter for preprocedural laboratory examination: Secondary | ICD-10-CM | POA: Insufficient documentation

## 2013-07-19 HISTORY — DX: Gastro-esophageal reflux disease without esophagitis: K21.9

## 2013-07-19 HISTORY — DX: Peripheral vascular disease, unspecified: I73.9

## 2013-07-19 HISTORY — DX: Dizziness and giddiness: R42

## 2013-07-19 HISTORY — DX: Cardiac arrhythmia, unspecified: I49.9

## 2013-07-19 HISTORY — DX: Unspecified osteoarthritis, unspecified site: M19.90

## 2013-07-19 LAB — URINALYSIS, ROUTINE W REFLEX MICROSCOPIC
Bilirubin Urine: NEGATIVE
Glucose, UA: 500 mg/dL — AB
Ketones, ur: NEGATIVE mg/dL
Nitrite: NEGATIVE
pH: 5.5 (ref 5.0–8.0)

## 2013-07-19 LAB — SURGICAL PCR SCREEN
MRSA, PCR: NEGATIVE
Staphylococcus aureus: POSITIVE — AB

## 2013-07-19 LAB — TYPE AND SCREEN

## 2013-07-19 LAB — COMPREHENSIVE METABOLIC PANEL
Albumin: 3.8 g/dL (ref 3.5–5.2)
BUN: 14 mg/dL (ref 6–23)
Chloride: 101 mEq/L (ref 96–112)
Creatinine, Ser: 0.87 mg/dL (ref 0.50–1.35)
Total Bilirubin: 0.4 mg/dL (ref 0.3–1.2)

## 2013-07-19 LAB — PROTIME-INR
INR: 1.03 (ref 0.00–1.49)
Prothrombin Time: 13.3 seconds (ref 11.6–15.2)

## 2013-07-19 LAB — CBC
HCT: 48.1 % (ref 39.0–52.0)
MCH: 31.4 pg (ref 26.0–34.0)
MCHC: 35.1 g/dL (ref 30.0–36.0)
MCV: 89.2 fL (ref 78.0–100.0)
RDW: 13 % (ref 11.5–15.5)

## 2013-07-19 MED ORDER — SODIUM CHLORIDE 0.9 % IV SOLN: INTRAVENOUS | Status: DC

## 2013-07-19 MED ORDER — CEFUROXIME SODIUM 1.5 G IJ SOLR
1.5000 g | INTRAMUSCULAR | Status: AC
  Administered 2013-07-20: 1.5 g via INTRAVENOUS
  Filled 2013-07-19: qty 1.5

## 2013-07-19 NOTE — Pre-Procedure Instructions (Signed)
[name]  07/19/2013   Your procedure is scheduled on:  Wednesday, September 24th  Report to Main entrance "A" at 0630 AM.  Call this number if you have problems the morning of surgery: [phone]   Remember:   Do not eat food or drink liquids after midnight.   Take these medicines the morning of surgery with A SIP OF WATER: Nexium, Lopressor, Lotrel  Do NOT take any diabetes medication or insulin morning of surgery.   Do not wear jewelry.  Do not wear lotions, powders, or perfumes. You may wear deodorant.  Do not shave 48 hours prior to surgery. Men may shave face and neck.  Do not bring valuables to the hospital.  [facility] is not responsible  for any belongings or valuables.  Contacts, dentures or bridgework may not be worn into surgery.  Leave suitcase in the car. After surgery it may be brought to your room.  For patients admitted to the hospital, checkout time is 11:00 AM the day of discharge.   Special Instructions: Shower using CHG 2 nights before surgery and the night before surgery.  If you shower the day of surgery use CHG.  Use special wash - you have one bottle of CHG for all showers.  You should use approximately 1/3 of the bottle for each shower.   Please read over the following fact sheets that you were given: Pain Booklet, Coughing and Deep Breathing, Blood Transfusion Information, MRSA Information and Surgical Site Infection Prevention

## 2013-07-19 NOTE — Progress Notes (Signed)
Primary physician - burnette - [place] Cardiologist - brackbill ekg 2014 in epic Stress 2012 in epic

## 2013-07-20 ENCOUNTER — Inpatient Hospital Stay (HOSPITAL_COMMUNITY)
Admission: RE | Admit: 2013-07-20 | Discharge: 2013-07-21 | DRG: 041 | Disposition: A | Discharge status: Home / Self Care | Payer: Medicare Other | Source: Ambulatory Visit | Attending: Vascular Surgery | Admitting: Vascular Surgery

## 2013-07-20 ENCOUNTER — Encounter (HOSPITAL_COMMUNITY): Payer: Self-pay | Admitting: Anesthesiology

## 2013-07-20 ENCOUNTER — Ambulatory Visit (HOSPITAL_COMMUNITY): Payer: Medicare Other | Admitting: Anesthesiology

## 2013-07-20 ENCOUNTER — Encounter (HOSPITAL_COMMUNITY): Payer: Self-pay | Admitting: *Deleted

## 2013-07-20 ENCOUNTER — Telehealth: Payer: Self-pay | Admitting: Vascular Surgery

## 2013-07-20 ENCOUNTER — Encounter (HOSPITAL_COMMUNITY)
Admission: RE | Disposition: A | Discharge status: Home / Self Care | Payer: Self-pay | Source: Ambulatory Visit | Attending: Vascular Surgery

## 2013-07-20 DIAGNOSIS — I10 Essential (primary) hypertension: Secondary | ICD-10-CM | POA: Diagnosis present

## 2013-07-20 DIAGNOSIS — Z87891 Personal history of nicotine dependence: Secondary | ICD-10-CM

## 2013-07-20 DIAGNOSIS — Z79899 Other long term (current) drug therapy: Secondary | ICD-10-CM

## 2013-07-20 DIAGNOSIS — Z794 Long term (current) use of insulin: Secondary | ICD-10-CM

## 2013-07-20 DIAGNOSIS — I2510 Atherosclerotic heart disease of native coronary artery without angina pectoris: Secondary | ICD-10-CM | POA: Diagnosis present

## 2013-07-20 DIAGNOSIS — Y838 Other surgical procedures as the cause of abnormal reaction of the patient, or of later complication, without mention of misadventure at the time of the procedure: Secondary | ICD-10-CM | POA: Diagnosis not present

## 2013-07-20 DIAGNOSIS — E119 Type 2 diabetes mellitus without complications: Secondary | ICD-10-CM | POA: Diagnosis present

## 2013-07-20 DIAGNOSIS — Z7982 Long term (current) use of aspirin: Secondary | ICD-10-CM

## 2013-07-20 DIAGNOSIS — N400 Enlarged prostate without lower urinary tract symptoms: Secondary | ICD-10-CM | POA: Diagnosis present

## 2013-07-20 DIAGNOSIS — E785 Hyperlipidemia, unspecified: Secondary | ICD-10-CM | POA: Diagnosis present

## 2013-07-20 DIAGNOSIS — Z01812 Encounter for preprocedural laboratory examination: Secondary | ICD-10-CM

## 2013-07-20 DIAGNOSIS — I6529 Occlusion and stenosis of unspecified carotid artery: Principal | ICD-10-CM | POA: Diagnosis present

## 2013-07-20 DIAGNOSIS — IMO0002 Reserved for concepts with insufficient information to code with codable children: Secondary | ICD-10-CM | POA: Diagnosis not present

## 2013-07-20 DIAGNOSIS — Z951 Presence of aortocoronary bypass graft: Secondary | ICD-10-CM

## 2013-07-20 DIAGNOSIS — Z5309 Procedure and treatment not carried out because of other contraindication: Secondary | ICD-10-CM

## 2013-07-20 DIAGNOSIS — I6322 Cerebral infarction due to unspecified occlusion or stenosis of basilar arteries: Secondary | ICD-10-CM

## 2013-07-20 HISTORY — PX: ENDARTERECTOMY: SHX5162

## 2013-07-20 LAB — HEMOGLOBIN A1C: Hgb A1c MFr Bld: 9.2 % — ABNORMAL HIGH (ref <5.7)

## 2013-07-20 LAB — GLUCOSE, CAPILLARY
Glucose-Capillary: 202 mg/dL — ABNORMAL HIGH (ref 70–99)
Glucose-Capillary: 188 mg/dL — ABNORMAL HIGH (ref 70–99)
Glucose-Capillary: 201 mg/dL — ABNORMAL HIGH (ref 70–99)

## 2013-07-20 LAB — CBC
HCT: 45.2 % (ref 39.0–52.0)
MCH: 30 pg (ref 26.0–34.0)
MCHC: 33.8 g/dL (ref 30.0–36.0)
Platelets: 162 10*3/uL (ref 150–400)
RBC: 5.1 MIL/uL (ref 4.22–5.81)
RDW: 12.9 % (ref 11.5–15.5)

## 2013-07-20 LAB — CREATININE, SERUM: Creatinine, Ser: 0.72 mg/dL (ref 0.50–1.35)

## 2013-07-20 SURGERY — ENDARTERECTOMY, CAROTID
Anesthesia: General | Site: Neck | Laterality: Left | Wound class: Clean

## 2013-07-20 MED ORDER — ALUM & MAG HYDROXIDE-SIMETH 200-200-20 MG/5ML PO SUSP
15.0000 mL | ORAL | Status: DC | PRN
  Filled 2013-07-20: qty 30

## 2013-07-20 MED ORDER — HYDROMORPHONE HCL PF 1 MG/ML IJ SOLN
0.2500 mg | INTRAMUSCULAR | Status: DC | PRN
  Administered 2013-07-20 (×4): 0.5 mg via INTRAVENOUS

## 2013-07-20 MED ORDER — DEXTROSE 5 % IV SOLN
1.5000 g | Freq: Two times a day (BID) | INTRAVENOUS | Status: AC
  Administered 2013-07-20 – 2013-07-21 (×2): 1.5 g via INTRAVENOUS
  Filled 2013-07-20 (×2): qty 1.5

## 2013-07-20 MED ORDER — LACTATED RINGERS IV SOLN
INTRAVENOUS | Status: DC | PRN
  Administered 2013-07-20 (×2): via INTRAVENOUS

## 2013-07-20 MED ORDER — BENAZEPRIL HCL 20 MG PO TABS
20.0000 mg | ORAL_TABLET | Freq: Every day | ORAL | Status: DC
  Administered 2013-07-21: 20 mg via ORAL
  Filled 2013-07-20: qty 1

## 2013-07-20 MED ORDER — SODIUM CHLORIDE 0.9 % IV SOLN: 500.0000 mL | Freq: Once | INTRAVENOUS | Status: AC | PRN

## 2013-07-20 MED ORDER — ASPIRIN EC 81 MG PO TBEC
81.0000 mg | DELAYED_RELEASE_TABLET | Freq: Every day | ORAL | Status: DC
  Administered 2013-07-20 – 2013-07-21 (×2): 81 mg via ORAL
  Filled 2013-07-20 (×2): qty 1

## 2013-07-20 MED ORDER — THROMBIN 20000 UNITS EX SOLR
CUTANEOUS | Status: DC | PRN
  Administered 2013-07-20: 10:00:00 via TOPICAL

## 2013-07-20 MED ORDER — POTASSIUM CHLORIDE CRYS ER 20 MEQ PO TBCR: 20.0000 meq | EXTENDED_RELEASE_TABLET | Freq: Once | ORAL | Status: AC | PRN

## 2013-07-20 MED ORDER — FENTANYL CITRATE 0.05 MG/ML IJ SOLN
INTRAMUSCULAR | Status: DC | PRN
  Administered 2013-07-20: 100 ug via INTRAVENOUS

## 2013-07-20 MED ORDER — ONDANSETRON HCL 4 MG/2ML IJ SOLN
4.0000 mg | Freq: Four times a day (QID) | INTRAMUSCULAR | Status: DC | PRN
  Administered 2013-07-20: 4 mg via INTRAVENOUS
  Filled 2013-07-20: qty 2

## 2013-07-20 MED ORDER — ASPIRIN EC 325 MG PO TBEC: 325.0000 mg | DELAYED_RELEASE_TABLET | Freq: Every day | ORAL | Status: DC

## 2013-07-20 MED ORDER — SODIUM CHLORIDE 0.9 % IR SOLN
Status: DC | PRN
  Administered 2013-07-20: 09:00:00

## 2013-07-20 MED ORDER — LABETALOL HCL 5 MG/ML IV SOLN: 10.0000 mg | INTRAVENOUS | Status: DC | PRN

## 2013-07-20 MED ORDER — INSULIN ASPART 100 UNIT/ML ~~LOC~~ SOLN: 15.0000 [IU] | Freq: Every day | SUBCUTANEOUS | Status: DC

## 2013-07-20 MED ORDER — METFORMIN HCL ER 500 MG PO TB24
500.0000 mg | ORAL_TABLET | Freq: Two times a day (BID) | ORAL | Status: DC
  Filled 2013-07-20 (×2): qty 1

## 2013-07-20 MED ORDER — OXYCODONE-ACETAMINOPHEN 5-325 MG PO TABS
1.0000 | ORAL_TABLET | ORAL | Status: DC | PRN
  Administered 2013-07-20: 2 via ORAL
  Administered 2013-07-21: 1 via ORAL
  Filled 2013-07-20: qty 1

## 2013-07-20 MED ORDER — LIDOCAINE HCL (CARDIAC) 20 MG/ML IV SOLN
INTRAVENOUS | Status: DC | PRN
  Administered 2013-07-20: 100 mg via INTRAVENOUS

## 2013-07-20 MED ORDER — PANTOPRAZOLE SODIUM 40 MG PO TBEC
40.0000 mg | DELAYED_RELEASE_TABLET | Freq: Every day | ORAL | Status: DC
  Administered 2013-07-21: 40 mg via ORAL
  Filled 2013-07-20: qty 1

## 2013-07-20 MED ORDER — METOPROLOL TARTRATE 1 MG/ML IV SOLN
2.0000 mg | INTRAVENOUS | Status: DC | PRN
  Administered 2013-07-20: 2.5 mg via INTRAVENOUS
  Filled 2013-07-20: qty 5

## 2013-07-20 MED ORDER — ROSUVASTATIN CALCIUM 20 MG PO TABS
20.0000 mg | ORAL_TABLET | Freq: Every day | ORAL | Status: DC
  Administered 2013-07-20: 20 mg via ORAL
  Filled 2013-07-20 (×2): qty 1

## 2013-07-20 MED ORDER — ONDANSETRON HCL 4 MG/2ML IJ SOLN
INTRAMUSCULAR | Status: DC | PRN
  Administered 2013-07-20 (×2): 4 mg via INTRAVENOUS

## 2013-07-20 MED ORDER — DOPAMINE-DEXTROSE 3.2-5 MG/ML-% IV SOLN: 3.0000 ug/kg/min | INTRAVENOUS | Status: DC

## 2013-07-20 MED ORDER — NITROGLYCERIN 0.4 MG SL SUBL: 0.4000 mg | SUBLINGUAL_TABLET | SUBLINGUAL | Status: DC | PRN

## 2013-07-20 MED ORDER — DOCUSATE SODIUM 100 MG PO CAPS
100.0000 mg | ORAL_CAPSULE | Freq: Every day | ORAL | Status: DC
  Administered 2013-07-21: 100 mg via ORAL
  Filled 2013-07-20: qty 1

## 2013-07-20 MED ORDER — LIDOCAINE HCL (PF) 1 % IJ SOLN
INTRAMUSCULAR | Status: AC
  Filled 2013-07-20: qty 30

## 2013-07-20 MED ORDER — INSULIN GLARGINE 100 UNIT/ML ~~LOC~~ SOLN
44.0000 [IU] | Freq: Every day | SUBCUTANEOUS | Status: DC
  Administered 2013-07-20: 44 [IU] via SUBCUTANEOUS
  Filled 2013-07-20 (×2): qty 0.44

## 2013-07-20 MED ORDER — AMLODIPINE BESY-BENAZEPRIL HCL 5-20 MG PO CAPS
1.0000 | ORAL_CAPSULE | Freq: Every day | ORAL | Status: DC
Start: 2013-07-20 — End: 2013-07-20

## 2013-07-20 MED ORDER — PROMETHAZINE HCL 25 MG/ML IJ SOLN: 6.2500 mg | INTRAMUSCULAR | Status: DC | PRN

## 2013-07-20 MED ORDER — SENNOSIDES-DOCUSATE SODIUM 8.6-50 MG PO TABS
1.0000 | ORAL_TABLET | Freq: Every evening | ORAL | Status: DC | PRN
  Filled 2013-07-20: qty 1

## 2013-07-20 MED ORDER — INSULIN LISPRO 100 UNIT/ML (KWIKPEN): 10.0000 [IU] | PEN_INJECTOR | SUBCUTANEOUS | Status: DC

## 2013-07-20 MED ORDER — ACETAMINOPHEN 325 MG PO TABS: 325.0000 mg | ORAL_TABLET | ORAL | Status: DC | PRN

## 2013-07-20 MED ORDER — GLYCOPYRROLATE 0.2 MG/ML IJ SOLN
INTRAMUSCULAR | Status: DC | PRN
  Administered 2013-07-20: 0.2 mg via INTRAVENOUS
  Administered 2013-07-20: 0.4 mg via INTRAVENOUS
  Administered 2013-07-20: 0.2 mg via INTRAVENOUS

## 2013-07-20 MED ORDER — DEXTRAN 40 IN SALINE 10-0.9 % IV SOLN
INTRAVENOUS | Status: AC
  Filled 2013-07-20: qty 500

## 2013-07-20 MED ORDER — PHENYLEPHRINE HCL 10 MG/ML IJ SOLN
10.0000 mg | INTRAVENOUS | Status: DC | PRN
  Administered 2013-07-20: 15 ug/min via INTRAVENOUS

## 2013-07-20 MED ORDER — HYDROMORPHONE HCL PF 1 MG/ML IJ SOLN
INTRAMUSCULAR | Status: AC
  Filled 2013-07-20: qty 1

## 2013-07-20 MED ORDER — BISACODYL 10 MG RE SUPP: 10.0000 mg | Freq: Every day | RECTAL | Status: DC | PRN

## 2013-07-20 MED ORDER — ACETAMINOPHEN 650 MG RE SUPP: 325.0000 mg | RECTAL | Status: DC | PRN

## 2013-07-20 MED ORDER — HYDRALAZINE HCL 20 MG/ML IJ SOLN
10.0000 mg | INTRAMUSCULAR | Status: DC | PRN
  Administered 2013-07-20 (×2): 10 mg via INTRAVENOUS
  Filled 2013-07-20 (×2): qty 1

## 2013-07-20 MED ORDER — INSULIN ASPART 100 UNIT/ML ~~LOC~~ SOLN
10.0000 [IU] | Freq: Two times a day (BID) | SUBCUTANEOUS | Status: DC
  Administered 2013-07-21 (×2): 10 [IU] via SUBCUTANEOUS

## 2013-07-20 MED ORDER — ROCURONIUM BROMIDE 100 MG/10ML IV SOLN
INTRAVENOUS | Status: DC | PRN
  Administered 2013-07-20: 50 mg via INTRAVENOUS

## 2013-07-20 MED ORDER — SODIUM CHLORIDE 0.9 % IV SOLN
INTRAVENOUS | Status: DC
  Administered 2013-07-20: 14:00:00 via INTRAVENOUS

## 2013-07-20 MED ORDER — 0.9 % SODIUM CHLORIDE (POUR BTL) OPTIME
TOPICAL | Status: DC | PRN
  Administered 2013-07-20: 2000 mL

## 2013-07-20 MED ORDER — ATORVASTATIN CALCIUM 40 MG PO TABS: 40.0000 mg | ORAL_TABLET | Freq: Every day | ORAL | Status: DC

## 2013-07-20 MED ORDER — MUPIROCIN 2 % EX OINT
TOPICAL_OINTMENT | CUTANEOUS | Status: AC
  Administered 2013-07-20: 07:00:00
  Filled 2013-07-20: qty 22

## 2013-07-20 MED ORDER — AMLODIPINE BESYLATE 5 MG PO TABS
5.0000 mg | ORAL_TABLET | Freq: Every day | ORAL | Status: DC
  Administered 2013-07-21: 5 mg via ORAL
  Filled 2013-07-20: qty 1

## 2013-07-20 MED ORDER — MORPHINE SULFATE 2 MG/ML IJ SOLN: 2.0000 mg | INTRAMUSCULAR | Status: DC | PRN

## 2013-07-20 MED ORDER — METOPROLOL TARTRATE 50 MG PO TABS
50.0000 mg | ORAL_TABLET | Freq: Two times a day (BID) | ORAL | Status: DC
  Administered 2013-07-20 – 2013-07-21 (×2): 50 mg via ORAL
  Filled 2013-07-20 (×3): qty 1

## 2013-07-20 MED ORDER — MUPIROCIN CALCIUM 2 % EX CREA
TOPICAL_CREAM | Freq: Two times a day (BID) | CUTANEOUS | Status: DC
  Administered 2013-07-20 – 2013-07-21 (×2): via TOPICAL

## 2013-07-20 MED ORDER — OXYCODONE-ACETAMINOPHEN 5-325 MG PO TABS
ORAL_TABLET | ORAL | Status: AC
  Filled 2013-07-20: qty 2

## 2013-07-20 MED ORDER — MAGNESIUM SULFATE 40 MG/ML IJ SOLN
2.0000 g | Freq: Once | INTRAMUSCULAR | Status: AC | PRN
  Filled 2013-07-20: qty 50

## 2013-07-20 MED ORDER — HYDROCHLOROTHIAZIDE 12.5 MG PO CAPS
12.5000 mg | ORAL_CAPSULE | Freq: Every day | ORAL | Status: DC
  Administered 2013-07-21: 12.5 mg via ORAL
  Filled 2013-07-20: qty 1

## 2013-07-20 MED ORDER — PHENOL 1.4 % MT LIQD
1.0000 | OROMUCOSAL | Status: DC | PRN
  Administered 2013-07-20: 1 via OROMUCOSAL
  Filled 2013-07-20: qty 177

## 2013-07-20 MED ORDER — PROPOFOL 10 MG/ML IV BOLUS
INTRAVENOUS | Status: DC | PRN
  Administered 2013-07-20: 50 mg via INTRAVENOUS
  Administered 2013-07-20: 100 mg via INTRAVENOUS

## 2013-07-20 MED ORDER — ENOXAPARIN SODIUM 30 MG/0.3ML ~~LOC~~ SOLN
30.0000 mg | SUBCUTANEOUS | Status: DC
  Administered 2013-07-21: 30 mg via SUBCUTANEOUS
  Filled 2013-07-20 (×2): qty 0.3

## 2013-07-20 MED ORDER — NEOSTIGMINE METHYLSULFATE 1 MG/ML IJ SOLN
INTRAMUSCULAR | Status: DC | PRN
  Administered 2013-07-20: 3 mg via INTRAVENOUS

## 2013-07-20 MED ORDER — GUAIFENESIN-DM 100-10 MG/5ML PO SYRP
15.0000 mL | ORAL_SOLUTION | ORAL | Status: DC | PRN
  Administered 2013-07-21: 15 mL via ORAL
  Filled 2013-07-20: qty 15

## 2013-07-20 MED ORDER — DEXTRAN 40 IN SALINE 10-0.9 % IV SOLN
INTRAVENOUS | Status: DC | PRN
  Administered 2013-07-20: 500 mL

## 2013-07-20 MED ORDER — INSULIN ASPART 100 UNIT/ML ~~LOC~~ SOLN
0.0000 [IU] | SUBCUTANEOUS | Status: DC
  Administered 2013-07-20: 5 [IU] via SUBCUTANEOUS
  Administered 2013-07-21 (×3): 3 [IU] via SUBCUTANEOUS

## 2013-07-20 MED ORDER — HYDROCHLOROTHIAZIDE 25 MG PO TABS: 12.5000 mg | ORAL_TABLET | Freq: Every day | ORAL | Status: DC

## 2013-07-20 SURGICAL SUPPLY — 54 items
ADH SKN CLS APL DERMABOND .7 (GAUZE/BANDAGES/DRESSINGS) ×1
ADPR TBG 2 MALE LL ART (MISCELLANEOUS)
BAG DECANTER FOR FLEXI CONT (MISCELLANEOUS) ×2 IMPLANT
CANISTER SUCTION 2500CC (MISCELLANEOUS) ×2 IMPLANT
CATH ROBINSON RED A/P 18FR (CATHETERS) ×2 IMPLANT
CATH SUCT 10FR WHISTLE TIP (CATHETERS) ×2 IMPLANT
CLIP TI MEDIUM 24 (CLIP) ×2 IMPLANT
CLIP TI WIDE RED SMALL 24 (CLIP) ×2 IMPLANT
COVER MAYO STAND STRL (DRAPES) ×1 IMPLANT
COVER PROBE W GEL 5X96 (DRAPES) IMPLANT
COVER SURGICAL LIGHT HANDLE (MISCELLANEOUS) ×2 IMPLANT
CRADLE DONUT ADULT HEAD (MISCELLANEOUS) ×2 IMPLANT
DERMABOND ADVANCED (GAUZE/BANDAGES/DRESSINGS) ×1
DERMABOND ADVANCED .7 DNX12 (GAUZE/BANDAGES/DRESSINGS) ×1 IMPLANT
DRAPE ORTHO SPLIT 77X108 STRL (DRAPES) ×2
DRAPE SURG ORHT 6 SPLT 77X108 (DRAPES) IMPLANT
DRAPE WARM FLUID 44X44 (DRAPE) ×2 IMPLANT
ELECT REM PT RETURN 9FT ADLT (ELECTROSURGICAL) ×2
ELECTRODE REM PT RTRN 9FT ADLT (ELECTROSURGICAL) ×1 IMPLANT
GLOVE BIO SURGEON STRL SZ7 (GLOVE) ×2 IMPLANT
GLOVE BIO SURGEON STRL SZ7.5 (GLOVE) ×1 IMPLANT
GLOVE BIOGEL PI IND STRL 7.0 (GLOVE) IMPLANT
GLOVE BIOGEL PI IND STRL 7.5 (GLOVE) ×1 IMPLANT
GLOVE BIOGEL PI IND STRL 8 (GLOVE) IMPLANT
GLOVE BIOGEL PI INDICATOR 7.0 (GLOVE) ×1
GLOVE BIOGEL PI INDICATOR 7.5 (GLOVE) ×3
GLOVE BIOGEL PI INDICATOR 8 (GLOVE) ×1
GOWN STRL NON-REIN LRG LVL3 (GOWN DISPOSABLE) ×6 IMPLANT
IV ADAPTER SYR DOUBLE MALE LL (MISCELLANEOUS) IMPLANT
KIT BASIN OR (CUSTOM PROCEDURE TRAY) ×2 IMPLANT
KIT ROOM TURNOVER OR (KITS) ×2 IMPLANT
NS IRRIG 1000ML POUR BTL (IV SOLUTION) ×4 IMPLANT
PACK CAROTID (CUSTOM PROCEDURE TRAY) ×2 IMPLANT
PAD ARMBOARD 7.5X6 YLW CONV (MISCELLANEOUS) ×4 IMPLANT
SET COLLECT BLD 21X3/4 12 PB (MISCELLANEOUS) IMPLANT
SHUNT CAROTID BYPASS 10 (VASCULAR PRODUCTS) IMPLANT
SHUNT CAROTID BYPASS 12FRX15.5 (VASCULAR PRODUCTS) IMPLANT
SPONGE SURGIFOAM ABS GEL 100 (HEMOSTASIS) IMPLANT
STOPCOCK 4 WAY LG BORE MALE ST (IV SETS) IMPLANT
SUT ETHILON 3 0 PS 1 (SUTURE) IMPLANT
SUT MNCRL AB 4-0 PS2 18 (SUTURE) ×2 IMPLANT
SUT PROLENE 6 0 BV (SUTURE) ×3 IMPLANT
SUT PROLENE 7 0 BV 1 (SUTURE) IMPLANT
SUT SILK 3 0 TIES 17X18 (SUTURE)
SUT SILK 3-0 18XBRD TIE BLK (SUTURE) IMPLANT
SUT VIC AB 3-0 SH 27 (SUTURE) ×2
SUT VIC AB 3-0 SH 27X BRD (SUTURE) ×1 IMPLANT
SYR TB 1ML LUER SLIP (SYRINGE) IMPLANT
SYSTEM CHEST DRAIN TLS 7FR (DRAIN) IMPLANT
TOWEL OR 17X24 6PK STRL BLUE (TOWEL DISPOSABLE) ×3 IMPLANT
TOWEL OR 17X26 10 PK STRL BLUE (TOWEL DISPOSABLE) ×2 IMPLANT
TUBING ART PRESS 48 MALE/FEM (TUBING) IMPLANT
TUBING EXTENTION W/L.L. (IV SETS) IMPLANT
WATER STERILE IRR 1000ML POUR (IV SOLUTION) ×2 IMPLANT

## 2013-07-20 NOTE — Anesthesia Procedure Notes (Signed)
Procedure Name: Intubation Date/Time: 07/20/2013 8:32 AM Performed by: Jerilee Hoh Pre-anesthesia Checklist: Patient identified, Emergency Drugs available, Suction available and Patient being monitored Patient Re-evaluated:Patient Re-evaluated prior to inductionOxygen Delivery Method: Circle system utilized Preoxygenation: Pre-oxygenation with 100% oxygen Intubation Type: IV induction Ventilation: Mask ventilation without difficulty and Oral airway inserted - appropriate to patient size Laryngoscope Size: Mac and 4 Grade View: Grade I Tube type: Oral Tube size: 7.5 mm Number of attempts: 1 Airway Equipment and Method: Stylet Placement Confirmation: ETT inserted through vocal cords under direct vision,  positive ETCO2 and breath sounds checked- equal and bilateral Secured at: 23 cm Tube secured with: Tape Dental Injury: Teeth and Oropharynx as per pre-operative assessment

## 2013-07-20 NOTE — Anesthesia Preprocedure Evaluation (Signed)
Anesthesia Evaluation  Patient identified by MRN, date of birth, ID band Patient awake    Reviewed: Allergy & Precautions, H&P , NPO status , Patient's Chart, lab work & pertinent test results, reviewed documented beta blocker date and time   Airway Mallampati: I TM Distance: >3 FB Neck ROM: Full    Dental  (+) Edentulous Upper and Edentulous Lower   Pulmonary neg pulmonary ROS, former smoker,    Pulmonary exam normal       Cardiovascular hypertension, Pt. on home beta blockers + CAD and + Peripheral Vascular Disease + dysrhythmias     Neuro/Psych negative neurological ROS  negative psych ROS   GI/Hepatic Neg liver ROS, GERD-  ,  Endo/Other  diabetes, Insulin Dependent  Renal/GU negative Renal ROS     Musculoskeletal   Abdominal   Peds  Hematology   Anesthesia Other Findings   Reproductive/Obstetrics                           Anesthesia Physical Anesthesia Plan  ASA: III  Anesthesia Plan: General   Post-op Pain Management:    Induction: Intravenous  Airway Management Planned: Oral ETT  Additional Equipment: Arterial line  Intra-op Plan:   Post-operative Plan: Extubation in OR  Informed Consent: I have reviewed the patients History and Physical, chart, labs and discussed the procedure including the risks, benefits and alternatives for the proposed anesthesia with the patient or authorized representative who has indicated his/her understanding and acceptance.     Plan Discussed with: CRNA, Anesthesiologist and Surgeon  Anesthesia Plan Comments:         Anesthesia Quick Evaluation

## 2013-07-20 NOTE — Preoperative (Signed)
Beta Blockers   Reason not to administer Beta Blockers:Not Applicable 

## 2013-07-20 NOTE — Anesthesia Postprocedure Evaluation (Signed)
Anesthesia Post Note  Patient: [name]  Procedure(s) Performed: Procedure(s) (LRB): ATTEMPTED ENDARTERECTOMY CAROTID (Left)  Anesthesia type: general  Patient location: PACU  Post pain: Pain level controlled  Post assessment: Patient's Cardiovascular Status Stable  Last Vitals:  Filed Vitals:   07/20/13 1030  BP: 105/56  Pulse:   Temp:   Resp:     Post vital signs: Reviewed and stable  Level of consciousness: sedated  Complications: No apparent anesthesia complications

## 2013-07-20 NOTE — H&P (View-Only) (Signed)
[facility] [place]  New Carotid Patient  Referred by:  Brent A Burnett, MD [address] [place], [place] [address]  Reason for referral: left carotid stenosis  History of Present Illness  [name] is a 66 y.o. (09/07/1946) male who presents with chief complaint: "tight left artery".  This patient previous has undergone R CEA >10 years prior.  On repeat carotid duplex today, he was found to have a high grade stenosis in the left internal carotid artery.  Patient has a history of R amaurosis fugax.  The patient has never had facial drooping or hemiplegia.  The patient has never had receptive or expressive aphasia.   The patient's previous neurologic deficits have resolved.  He is asx currently. The patient's risks factors for carotid disease include: DM, hyperlipidemia, HTN, and prior smoking.  Past Medical History  Diagnosis Date  . Hyperlipidemia   . Diabetes mellitus   . Hypertension   . IHD (ischemic heart disease)     Prior CABG in 2003  . History of kidney stones   . Torn rotator cuff   . Normal nuclear stress test June 2012  . CAD (coronary artery disease)     Past Surgical History  Procedure Laterality Date  . Cardiac catheterization  05/03/2002    EF 40-45%  . Coronary artery bypass graft  04/2002  . Carotid endarterectomy    . Appendectomy    . Hernia repair    . Cardiovascular stress test  08/10/2006    EF 65%  . Doppler echocardiography  08/12/2002    EF 80-85%  . Shoulder surgery      History   Social History  . Marital Status: Married    Spouse Name: N/A    Number of Children: N/A  . Years of Education: N/A   Occupational History  . Not on file.   Social History Main Topics  . Smoking status: Former Smoker    Quit date: 10/27/2001  . Smokeless tobacco: Not on file  . Alcohol Use: No  . Drug Use: No  . Sexual Activity: Not on file   Other Topics Concern  . Not on file   Social History Narrative  . No narrative on file     Family History  Problem Relation Age of Onset  . Heart attack Mother   . Diabetes Mother   . Heart disease Mother   . Hypertension Mother   . Heart attack Father   . Heart disease Father   . Hypertension Father   . Diabetes Brother   . Diabetes Sister   . Diabetes Daughter   . Heart disease Daughter   . Hypertension Daughter   . Diabetes Sister    Current Outpatient Prescriptions on File Prior to Visit  Medication Sig Dispense Refill  . amLODipine-benazepril (LOTREL) 5-20 MG per capsule Take 1 capsule by mouth daily.        . aspirin 81 MG tablet Take 81 mg by mouth daily.        . esomeprazole (NEXIUM) 40 MG capsule Take 40 mg by mouth daily before breakfast.        . hydrochlorothiazide 25 MG tablet TAKE ONE-HALF TABLET BY MOUTH EVERY DAY.  100 tablet  3  . insulin glargine (LANTUS) 100 UNIT/ML injection Inject into the skin at bedtime.        . insulin lispro (HUMALOG) 100 UNIT/ML injection Inject into the skin 3 (three) times daily before meals.        .   metFORMIN (GLUCOPHAGE-XR) 500 MG 24 hr tablet       . metoprolol (LOPRESSOR) 50 MG tablet TAKE ONE TABLET BY MOUTH TWICE DAILY  180 tablet  3  . nitroGLYCERIN (NITROSTAT) 0.4 MG SL tablet Place 1 tablet (0.4 mg total) under the tongue every 5 (five) minutes as needed.  25 tablet  3  . ONE TOUCH ULTRA TEST test strip as directed.      . rosuvastatin (CRESTOR) 20 MG tablet Take 1 tablet (20 mg total) by mouth daily.  90 tablet  3   No current facility-administered medications on file prior to visit.    Allergies  Allergen Reactions  . Codeine   . Lipitor [Atorvastatin Calcium]   . Sulfa Drugs Cross Reactors     REVIEW OF SYSTEMS:  (Positives checked otherwise negative)  CARDIOVASCULAR:  [] chest pain, [] chest pressure, [] palpitations, [] shortness of breath when laying flat, [] shortness of breath with exertion,  []x pain in feet when walking, [] pain in feet when laying flat, [] history of blood clot in veins  (DVT), [] history of phlebitis, [] swelling in legs, [] varicose veins  PULMONARY:  [] productive cough, [] asthma, [] wheezing  NEUROLOGIC:  [x] weakness in arms or legs, [x] numbness in arms or legs, [] difficulty speaking or slurred speech, [] temporary loss of vision in one eye, [x] dizziness  HEMATOLOGIC:  [] bleeding problems, [] problems with blood clotting too easily  MUSCULOSKEL:  [] joint pain, [] joint swelling  GASTROINTEST:  [] vomiting blood, [] blood in stool     GENITOURINARY:  [] burning with urination, [] blood in urine  PSYCHIATRIC:  [] history of major depression  INTEGUMENTARY:  [] rashes, [] ulcers  CONSTITUTIONAL:  [] fever, [] chills  For VQI Use Only  PRE-ADM LIVING: Home  AMB STATUS: Ambulatory  CAD Sx: None  PRIOR CHF: None  STRESS TEST: [x] No, [ ] Normal, [ ] + ischemia, [ ] + MI, [ ] Both  Physical Examination  Filed Vitals:   07/15/13 1415 07/15/13 1421  BP: 142/83 134/72  Pulse: 65   Height: 5' 10" (1.778 m)   Weight: 188 lb 3.2 oz (85.367 kg)   SpO2: 97%    Body mass index is 27 kg/(m^2).  General: A&O x 3, WDWN  Head: [place]/AT  Ear/Nose/Throat: Hearing grossly intact, nares w/o erythema or drainage, oropharynx w/o Erythema/Exudate, Mallampati score: 3  Eyes: PERRLA, EOMI  Neck: Supple, no nuchal rigidity, no palpable LAD  Pulmonary: Sym exp, good air movt, CTAB, no rales, rhonchi, & wheezing  Cardiac: RRR, Nl S1, S2, no Murmurs, rubs or gallops  Vascular: Vessel Right Left  Radial Palpable Palpable  Brachial Palpable Palpable  Carotid Palpable, without bruit Palpable, without bruit  Aorta Not palpable N/A  Femoral Palpable Palpable  Popliteal Not palpable Not palpable  PT Palpable Palpable  DP Palpable Palpable   Gastrointestinal: soft, NTND, -G/R, - HSM, - masses, - CVAT B  Musculoskeletal: M/S 5/5 throughout , Extremities without ischemic changes   Neurologic: CN 2-12 intact , Pain and light touch intact in  extremities , Motor exam as listed above  Psychiatric: Judgment intact, Mood & affect appropriate for pt's clinical situation  Dermatologic: See M/S exam for extremity exam, no rashes otherwise noted  Lymph : No Cervical, Axillary, or Inguinal lymphadenopathy   Non-Invasive Vascular Imaging  L CAROTID DUPLEX (Date: 07/15/2013):   L ICA stenosis: 80-99%  Mid-level bifurcation  Ordered   and finalized by myself.  Outside Studies/Documentation 3 pages of outside documents were reviewed including: outside cardiology chart.  Medical Decision Making  [name] is a 66 y.o. male who presents with: asx L ICA stenosis 80-99%., s/p prior R CEA   Based on the patient's vascular studies and examination, I have offered the patient: L CEA. I discussed with the patient the risks, benefits, and alternatives to carotid endarterectomy.   I discussed the procedural details of carotid endarterectomy with the patient.   The patient is aware that the risks of carotid endarterectomy include but are not limited to: bleeding, infection, stroke, myocardial infarction, death, cranial nerve injuries both temporary and permanent, neck hematoma, possible airway compromise, labile blood pressure post-operatively, cerebral hyperperfusion syndrome, and possible need for additional interventions in the future.  The patient is aware of the risks and agrees to proceed forward with the procedure.  I discussed in depth with the patient the nature of atherosclerosis, and emphasized the importance of maximal medical management including strict control of blood pressure, blood glucose, and lipid levels, obtaining regular exercise, antiplatelet agents, and cessation of smoking.   The patient is currently on a statin: Crestor.  The patient is currently on an anti-platelet: ASA.  The patient is aware that without maximal medical management the underlying atherosclerotic disease process will progress, limiting the benefit  of any interventions.  We will also get preop clearance from Dr. Brackbill to aid with risk stratification in this patient.  Thank you for allowing us to participate in this patient's care.  Brian Chen, MD [facility] [place] Office: [phone] Pager: [phone]  07/15/2013, 4:19 PM        

## 2013-07-20 NOTE — Progress Notes (Signed)
Utilization review completed.  

## 2013-07-20 NOTE — Transfer of Care (Signed)
Immediate Anesthesia Transfer of Care Note  Patient: [name]  Procedure(s) Performed: Procedure(s): ATTEMPTED ENDARTERECTOMY CAROTID (Left)  Patient Location: PACU  Anesthesia Type:General  Level of Consciousness: awake, alert , oriented and patient cooperative  Airway & Oxygen Therapy: Patient Spontanous Breathing and Patient connected to nasal cannula oxygen  Post-op Assessment: Report given to PACU RN, Post -op Vital signs reviewed and stable and Patient moving all extremities  Post vital signs: Reviewed and stable  Complications: No apparent anesthesia complications

## 2013-07-20 NOTE — Progress Notes (Signed)
Pt continues to choke on water. Auscultated airway no stridor nor distress noted. SpO2 98% on 2 liters oxygen per nasal cannula. Will continue to monitor. Left neck hematoma outlined.

## 2013-07-20 NOTE — Progress Notes (Signed)
Dr Imogene Burn in to see pt ok to transfer to room

## 2013-07-20 NOTE — Progress Notes (Signed)
[facility] Progress Note  07/20/2013 3:49 PM Day of Surgery  Subjective:  Denies difficulty breathing or shortness of breath.  States it is sore to swallow, but no difficulty with drinking water.  Afebrile HR 50's Systolic 140's-180's 96% 2LO2NC  Filed Vitals:   07/20/13 1400  BP: 179/75  Pulse: 58  Temp: 97.8 F (36.6 C)  Resp: 15     Physical Exam: Neuro:  In tact Incision:  Hematoma left neck.  Slightly larger than earlier line drawn.  Continues to be soft.  CBC    Component Value Date/Time   WBC 8.8 07/19/2013 1236   RBC 5.39 07/19/2013 1236   HGB 16.9 07/19/2013 1236   HCT 48.1 07/19/2013 1236   PLT 188 07/19/2013 1236   MCV 89.2 07/19/2013 1236   MCH 31.4 07/19/2013 1236   MCHC 35.1 07/19/2013 1236   RDW 13.0 07/19/2013 1236   LYMPHSABS 1.8 09/23/2008 1920   MONOABS 0.7 09/23/2008 1920   EOSABS 0.1 09/23/2008 1920   BASOSABS 0.0 09/23/2008 1920    BMET    Component Value Date/Time   NA 140 07/19/2013 1236   K 3.8 07/19/2013 1236   CL 101 07/19/2013 1236   CO2 26 07/19/2013 1236   GLUCOSE 169* 07/19/2013 1236   BUN 14 07/19/2013 1236   CREATININE 0.87 07/19/2013 1236   CALCIUM 9.7 07/19/2013 1236   GFRNONAA 89* 07/19/2013 1236   GFRAA >90 07/19/2013 1236     Intake/Output Summary (Last 24 hours) at 07/20/13 1549 Last data filed at 07/20/13 1026  Gross per 24 hour  Intake   1200 ml  Output     50 ml  Net   1150 ml      Assessment/Plan:  This is a 67 y.o. male who is s/p Left neck exploration (abort left carotid endarterectomy) CEA Day of Surgery  -pt neuro exam is in tact -he does have a left neck hematoma-this is slightly larger from what was marked earlier.  It has increased since he vomited.  He is not having any difficulty swallowing or breathing. -He has had Zofran, but does not want phenergan. -CBC to be drawn now and will check H&H -systolic in 180's at this time-will give lopressor -continue close monitoring   Doreatha Massed,  PA-C [facility] [phone]

## 2013-07-20 NOTE — Interval H&P Note (Signed)
[facility] of   History and Physical Update  The patient was interviewed and re-examined.  The patient's previous History and Physical has been reviewed and is unchanged.  There is no change in the plan of care: L CEA.  I discussed with the patient the risks, benefits, and alternatives to carotid endarterectomy.  I discussed the procedural details of carotid endarterectomy with the patient.  The patient is aware that the risks of carotid endarterectomy include but are not limited to: bleeding, infection, stroke, myocardial infarction, death, cranial nerve injuries both temporary and permanent, neck hematoma, possible airway compromise, labile blood pressure post-operatively, cerebral hyperperfusion syndrome, and possible need for additional interventions in the future. The patient is aware of the risks and agrees to proceed forward with the procedure.  Leonides Sake, MD [facility] of [place] Office: [phone] Pager: [phone]  07/20/2013, 7:56 AM

## 2013-07-20 NOTE — Progress Notes (Signed)
Pt. Unable to void on own. Bladder scan >836mL. Straight cath per order set using sterile technique. UOP , clear, yellow urine. Pt. Tolerated well. Will continue to monitor.

## 2013-07-20 NOTE — Progress Notes (Signed)
Physician notified: Imogene Burn At: 1535  Regarding: FYI pt. Hematoma increased in size due to vomiting. Pt. States no relief of nausea by zofran, refusing phenergan.  Awaiting return response.   Returned Response at: 1536  Order(s): Will send PA by, not in house at this time. Continue to monitor.

## 2013-07-20 NOTE — Telephone Encounter (Addendum)
Message copied by Fredrich Birks on Wed Jul 20, 2013  3:34 PM ------      Message from: Melene Plan      Created: Wed Jul 20, 2013  1:35 PM                   ----- Message -----         From: Fransisco Hertz, MD         Sent: 07/20/2013  12:07 PM           To: Reuel Derby, Melene Plan, RN            [name]      [id number]      02/02/1946            Procedure: L neck exploration (aborted L CEA)            Asst: Lianne Cure, [facility]             Follow-up: 2 weeks       ------  Lm for patient, dpm

## 2013-07-20 NOTE — Op Note (Addendum)
OPERATIVE NOTE   PROCEDURE: 1. Left neck exploration (abort left carotid endarterectomy)  PRE-OPERATIVE DIAGNOSIS: left internal carotid artery >80%  POST-OPERATIVE DIAGNOSIS: same as above   SURGEON: Leonides Sake, MD  ASSISTANT(S): Lianne Cure, PAC   ANESTHESIA: general  ESTIMATED BLOOD LOSS: 50 cc  FINDING(S): 1. Calcific plaque extending past 2 inch distal to angle of mandible (no soft endpoint palpable)  SPECIMEN(S):  none  INDICATIONS:   [name] is a 67 y.o. male who presents with asymptomatic left internal carotid artery stenosis >80%.  On pre-operative duplex, it appeared the lesion was at the level of hyoid with an open area distal to the disease.  I discussed with the patient the risks, benefits, and alternatives to carotid endarterectomy.  I discussed the procedural details of carotid endarterectomy with the patient.  The patient is aware that the risks of carotid endarterectomy include but are not limited to: bleeding, infection, stroke, myocardial infarction, death, cranial nerve injuries both temporary and permanent, neck hematoma, possible airway compromise, labile blood pressure post-operatively, cerebral hyperperfusion syndrome, and possible need for additional interventions in the future. The patient is aware of the risks and agrees to proceed forward with the procedure.  DESCRIPTION: After obtaining full informed written consent, the patient was brought back to the operating room and placed supine upon the operating table.  The patient received IV antibiotics prior to induction.  After obtaining adequate anesthesia, the patient was prepped and draped in the standard fashion for: left carotid endarterectomy.  I made an incision anterior to the sternocleidomastoid muscle and dissected down to the internal jugular vein.  I retracted the vein posteriorly and then dissected down to the common carotid artery.  This was dissected out proximally and an umbilical tape  applied.  I then dissected out the common carotid artery distally in a periadventitial fashion.  In the process, it became apparent there some rotation in the normal carotid arrangement with the external carotid artery in lateral position and the internal carotid deep to the carotid artery.  The vagus nerve course appeared to be aberrant, running medial in a possible non-recurrent course.  I was able to carry the dissect proximally and was able to visualize the hypoglossal nerve.  The bulk of the disease was at the bifurcation and proximal internal carotid artery; however, on palpation a calcified plaque extended more distally than thought previously.  I further dissected out the distal internal carotid artery, including releasing the hypoglossal nerve from connective tissue attachments and ligating vascular structures tethering the nerve.  The more distal internal carotid artery was >2 inches distal to the angle of the mandible.  There was no palpable soft spot in the internal carotid artery to which I could terminate the endarterectomy.  At this point, I elected to abort the endarterectomy as a osteotomy of the mandible would be necessary to complete the case.   The surgical field was washed out.  Thrombin and gelfoam were applied.  After waiting a few minutes, all gelfoam was removed and the wound washed out.  No further active bleeding was present.  I reapproximated the platysma muscle with a running 3-0 Vicryl.  The skin was reapproximated with a running subcuticular stitch of 4-0 Monocryl.  The skin was cleaned, dried, and reinforced with Dermabond.    The plan is to let the patient recover for the next two weeks, and then plan on a carotid angiogram with possible stent placement to treat the left internal carotid artery.  COMPLICATIONS: inability to complete procedure  CONDITION: stable  Leonides Sake, MD [facility] of [place] Office: [phone] Pager:  [phone]  07/20/2013, 9:51 AM

## 2013-07-21 ENCOUNTER — Encounter (HOSPITAL_COMMUNITY): Payer: Self-pay | Admitting: Vascular Surgery

## 2013-07-21 LAB — BASIC METABOLIC PANEL
BUN: 16 mg/dL (ref 6–23)
CO2: 24 mEq/L (ref 19–32)
Calcium: 8.4 mg/dL (ref 8.4–10.5)
Chloride: 105 mEq/L (ref 96–112)
Creatinine, Ser: 0.82 mg/dL (ref 0.50–1.35)
GFR calc Af Amer: >90 mL/min (ref >90)
GFR calc non Af Amer: >90 mL/min (ref >90)
Potassium: 3.5 mEq/L (ref 3.5–5.1)
Sodium: 140 mEq/L (ref 135–145)

## 2013-07-21 LAB — CBC
HCT: 40.7 % (ref 39.0–52.0)
MCH: 31.1 pg (ref 26.0–34.0)
MCV: 88.5 fL (ref 78.0–100.0)
Platelets: 166 10*3/uL (ref 150–400)
RDW: 13.1 % (ref 11.5–15.5)
WBC: 8.5 10*3/uL (ref 4.0–10.5)

## 2013-07-21 LAB — GLUCOSE, CAPILLARY
Glucose-Capillary: 167 mg/dL — ABNORMAL HIGH (ref 70–99)
Glucose-Capillary: 129 mg/dL — ABNORMAL HIGH (ref 70–99)
Glucose-Capillary: 176 mg/dL — ABNORMAL HIGH (ref 70–99)

## 2013-07-21 MED ORDER — OXYCODONE-ACETAMINOPHEN 5-325 MG PO TABS: 1.0000 | ORAL_TABLET | Freq: Four times a day (QID) | ORAL | Status: DC | PRN

## 2013-07-21 MED ORDER — CLOPIDOGREL BISULFATE 75 MG PO TABS
75.0000 mg | ORAL_TABLET | Freq: Every day | ORAL | Status: DC
  Filled 2013-07-21: qty 1

## 2013-07-21 MED ORDER — TAMSULOSIN HCL 0.4 MG PO CAPS
0.4000 mg | ORAL_CAPSULE | Freq: Every day | ORAL | Status: DC
  Filled 2013-07-21: qty 1

## 2013-07-21 MED ORDER — GUAIFENESIN ER 600 MG PO TB12
600.0000 mg | ORAL_TABLET | Freq: Two times a day (BID) | ORAL | Status: DC | PRN
  Administered 2013-07-21: 600 mg via ORAL
  Filled 2013-07-21: qty 1

## 2013-07-21 MED ORDER — CLOPIDOGREL BISULFATE 75 MG PO TABS: 75.0000 mg | ORAL_TABLET | Freq: Every day | ORAL | Status: DC

## 2013-07-21 MED ORDER — TAMSULOSIN HCL 0.4 MG PO CAPS
0.4000 mg | ORAL_CAPSULE | Freq: Every day | ORAL | Status: DC
  Administered 2013-07-21: 0.4 mg via ORAL
  Filled 2013-07-21: qty 1

## 2013-07-21 NOTE — Progress Notes (Signed)
Paged MD on-call (Dr. Hart Rochester) regarding pt's inability to void. Bladder Scan indicated retained urine. I and O Cath Ordered. Will perform and document results.

## 2013-07-21 NOTE — Progress Notes (Signed)
Discussed discharge instructions and medications with patient and wife. Both verbalized understanding with all questions answered. VSS. Pt able to void a total of 400cc and able to discharge without a foley catheter.Pt discharged home with wife.  [facility]

## 2013-07-21 NOTE — Progress Notes (Addendum)
VASCULAR AND VEIN SURGERY POST - OP CEA PROGRESS NOTE  Date of Surgery: 07/20/2013  Surgeon(s): Fransisco Hertz, MD 1 Day Post-Op left carotid attempt.  HPI: [name] is a 67 y.o. male who is 1 Day Post-Op . Patient is doing well. Pre-operative symptoms are Unchanged Patient denies headache; Patient reports difficulty swallowing;  He complains of flem in his throat. denies weakness in upper or lower extremities; Pt. denies other symptoms of stroke or TIA.  IMAGING: None   Significant Diagnostic Studies: CBC Lab Results  Component Value Date   WBC 8.5 07/21/2013   HGB 14.3 07/21/2013   HCT 40.7 07/21/2013   MCV 88.5 07/21/2013   PLT 166 07/21/2013    BMET    Component Value Date/Time   NA 140 07/21/2013 0415   K 3.5 07/21/2013 0415   CL 105 07/21/2013 0415   CO2 24 07/21/2013 0415   GLUCOSE 134* 07/21/2013 0415   BUN 16 07/21/2013 0415   CREATININE 0.82 07/21/2013 0415   CALCIUM 8.4 07/21/2013 0415   GFRNONAA >90 07/21/2013 0415   GFRAA >90 07/21/2013 0415    COAG Lab Results  Component Value Date   INR 1.03 07/19/2013   No results found for this basename: PTT      Intake/Output Summary (Last 24 hours) at 07/21/13 0720 Last data filed at 07/21/13 0600  Gross per 24 hour  Intake   2675 ml  Output   1800 ml  Net    875 ml    Physical Exam:  BP Readings from Last 3 Encounters:  07/21/13 120/57  07/21/13 120/57  07/19/13 141/73   Temp Readings from Last 3 Encounters:  07/21/13 98.4 F (36.9 C) Oral  07/21/13 98.4 F (36.9 C) Oral  07/19/13 99 F (37.2 C)    SpO2 Readings from Last 3 Encounters:  07/21/13 97%  07/21/13 97%  07/19/13 95%   Pulse Readings from Last 3 Encounters:  07/21/13 62  07/21/13 62  07/19/13 53    Pt is A&O x 3 Gait is normal Speech is fluent left Neck Wound is hematoma or healing well Patient with Negative tongue deviation and Negative facial droop Pt has good and equal strength in all extremities  Assessment/Plan:: [name] is a 67 y.o. male is S/P Left carotid attempt. Pt is voiding, ambulating and taking po with some difficulty.  He is having trouble clearing his throat.    Discharge to: Home Follow-up in 2 weeks   [facility]  07/21/2013 7:20 AM  Addendum  I have independently interviewed and examined the patient, and I agree with the physician assistant's findings.  Left neck hematoma unchanged and soft.  Pt has been swallowing pills without difficulty overnight.  Advance diet.  Awaiting void prior to d/c.  If unable to void, might need foley placement, leg bag drainage, and follow up in Urology office.  Will start flomax to help with presumed BPH.  Unfortunately, this patient's calcific atherosclerosis extends beyond the surgical extent accessible.  There was still a palpable posterior calcified plaque on 2 inches above the angle of the mandible, so the case was aborted.  Plan:  - Start Plavix (ASA + Plavix) - will start preauthorization process for Carotid angiogram, possible L ICA stenting - Called urology consult for out patient follow up.  He will be sent home with foley in place and follow up 07/26/2013 with Dr. Berniece Salines.   Leonides Sake, MD [facility] of [facility]  Office: [phone] Pager: [phone]  07/21/2013, 8:11 AM

## 2013-07-25 ENCOUNTER — Encounter: Payer: Self-pay | Admitting: Surgery

## 2013-07-25 ENCOUNTER — Telehealth: Payer: Self-pay

## 2013-07-25 ENCOUNTER — Ambulatory Visit (INDEPENDENT_AMBULATORY_CARE_PROVIDER_SITE_OTHER): Payer: Self-pay | Admitting: Surgery

## 2013-07-25 DIAGNOSIS — I6529 Occlusion and stenosis of unspecified carotid artery: Secondary | ICD-10-CM

## 2013-07-25 DIAGNOSIS — T148XXA Other injury of unspecified body region, initial encounter: Secondary | ICD-10-CM | POA: Insufficient documentation

## 2013-07-25 DIAGNOSIS — L24A9 Irritant contact dermatitis due friction or contact with other specified body fluids: Secondary | ICD-10-CM | POA: Insufficient documentation

## 2013-07-25 MED ORDER — CEPHALEXIN 500 MG PO CAPS: 500.0000 mg | ORAL_CAPSULE | Freq: Four times a day (QID) | ORAL | Status: DC

## 2013-07-25 NOTE — Telephone Encounter (Signed)
Wife called to report pt having frequent oozing of pink, thin bloody drainage from left neck incision, at the top and bottom of the incision.  Stated on Saturday and Sunday AM, the bandage was soaked with pink,  bloody drainage.  Reports   She called in and spoke with Dr. Imogene Burn on Saturday night, and advised to put a pressure bandage on the site; if the incision continues to bleed, pt. Should call on Monday AM and request appt. To be checked.  Discussed with Dr. Myra Gianotti.  Advised to bring pt. In today as an add-on.   Notified pt's wife.  Agrees with plan.

## 2013-07-25 NOTE — Progress Notes (Signed)
[facility] of [place]  Subjective  - Left neck incision with SS drainage.  No fever, swallowing or speech difficulties.   Objective 159/72 78 98.5 F (36.9 C) (Oral) 18 98% @IOBRIEF @  Left neck incision with ecchymosis, and mod edema Tissue is soft surrounding incision No pulsatile mass noted.  Assessment/Planning: POD #5  Left carotid endarterectomy. He stopped his plavix Saturday  He will be put on keflex qid for 10 days. Dry dressing daily, or as needed F/U with Dr. Imogene Burn Friday for incision check   [name], [name] 07/25/2013 1:06 PM --  Laboratory Lab Results: No results found for this basename: WBC, HGB, HCT, PLT,  in the last 72 hours BMET No results found for this basename: NA, K, CL, CO2, GLUCOSE, BUN, CREATININE, CALCIUM,  in the last 72 hours  COAG Lab Results  Component Value Date   INR 1.03 07/19/2013   No results found for this basename: PTT    The patient is status post left carotid exploration on 07/20/2013. Endarterectomy could not be performed given the extensive nature of the calcified plaque which went up to the skull base. The patient was placed on Plavix. He developed a hematoma. He has been having serosanguineous drainage over the weekend. He wanted to be evaluated.  On examination the patient has had approximately 1 mm worth of skin separation at the inferior aspect of the incision. There is a hematoma present within the left neck. This appears to be soft and not expanding. The patient is having some drainage. He remains neurologically intact.  I reassured the patient that this will likely resolve with time. I do not feel that he needs to undergo surgical exploration and evacuation of the hematoma at this time. He is not having any difficulties with breathing or with swallowing. I elected to place him on antibiotics because there is some separation within the skin. He will followup with Dr. Imogene Burn on Friday

## 2013-07-28 ENCOUNTER — Encounter: Payer: Self-pay | Admitting: Vascular Surgery

## 2013-07-29 ENCOUNTER — Encounter: Payer: Self-pay | Admitting: Vascular Surgery

## 2013-07-29 ENCOUNTER — Ambulatory Visit (INDEPENDENT_AMBULATORY_CARE_PROVIDER_SITE_OTHER): Payer: Self-pay | Admitting: Vascular Surgery

## 2013-07-29 DIAGNOSIS — L24A9 Irritant contact dermatitis due friction or contact with other specified body fluids: Secondary | ICD-10-CM

## 2013-07-29 DIAGNOSIS — T148XXA Other injury of unspecified body region, initial encounter: Secondary | ICD-10-CM

## 2013-07-29 DIAGNOSIS — I6529 Occlusion and stenosis of unspecified carotid artery: Secondary | ICD-10-CM

## 2013-07-29 NOTE — Progress Notes (Signed)
[facility] OF [place]  Postoperative Visit  History of Present Illness  [name] is a 67 y.o. male who presents for postoperative follow-up for: aborted L CEA (Date: 07/20/13).  The patient's neck incision is not healed.  He has a hematoma from the procedure which had improved after drainage but has swollen again.  The patient has had no stroke or TIA symptoms.  For VQI Use Only  PRE-ADM LIVING: Home  AMB STATUS: Ambulatory  History   Social History  . Marital Status: Married    Spouse Name: N/A    Number of Children: N/A  . Years of Education: N/A   Occupational History  . Not on file.   Social History Main Topics  . Smoking status: Former Smoker    Quit date: 10/27/2001  . Smokeless tobacco: Not on file  . Alcohol Use: No  . Drug Use: No  . Sexual Activity: Not on file   Other Topics Concern  . Not on file   Social History Narrative  . No narrative on file    Current Outpatient Prescriptions on File Prior to Visit  Medication Sig Dispense Refill  . amLODipine-benazepril (LOTREL) 5-20 MG per capsule Take 1 capsule by mouth daily.       Marland Kitchen aspirin EC 81 MG tablet Take 81 mg by mouth daily.      . cephALEXin (KEFLEX) 500 MG capsule Take 1 capsule (500 mg total) by mouth 4 (four) times daily.  40 capsule  0  . clopidogrel (PLAVIX) 75 MG tablet Take 1 tablet (75 mg total) by mouth daily with breakfast.  30 tablet  2  . esomeprazole (NEXIUM) 40 MG capsule Take 40 mg by mouth daily before breakfast.       . hydrochlorothiazide (HYDRODIURIL) 12.5 MG tablet Take 12.5 mg by mouth daily.       . Insulin Glargine (LANTUS SOLOSTAR) 100 UNIT/ML SOPN Inject 44 Units into the skin at bedtime.      . insulin lispro (HUMALOG PEN) 100 UNIT/ML SOPN Inject 10-15 Units into the skin See admin instructions. Takes 10 units at breakfast and lunch. Take 15 units at dinner; If blood sugar 150-200 take an additional 1 unit, if blood sugar 201-250 take an additional 2  units; If blood sugar 251-300 take an additional 3 units, if blood sugar >300 take an additional 4 units      . metFORMIN (GLUCOPHAGE-XR) 500 MG 24 hr tablet Take 500 mg by mouth 2 (two) times daily with a meal.       . metoprolol (LOPRESSOR) 50 MG tablet Take 50 mg by mouth 2 (two) times daily.      . nitroGLYCERIN (NITROSTAT) 0.4 MG SL tablet Place 0.4 mg under the tongue every 5 (five) minutes as needed for chest pain.      . ONE TOUCH ULTRA TEST test strip as directed.      Marland Kitchen oxyCODONE-acetaminophen (PERCOCET/ROXICET) 5-325 MG per tablet Take 1 tablet by mouth every 6 (six) hours as needed for pain.  30 tablet  0  . rosuvastatin (CRESTOR) 20 MG tablet Take 20 mg by mouth at bedtime.       No current facility-administered medications on file prior to visit.    Physical Examination  Filed Vitals:   07/29/13 0952  BP: 147/79  Pulse:   Temp:     R Neck: Incision has superificial separation inferiorly, hematoma is larger than postop but remains mod. Size (3-4 cm) with some serosang  drainage, the hematoma is without tension Neuro: CN 2-12 are intact , Motor strength is 5/5 bilaterally, sensation is grossly intact  Medical Decision Making  [name] is a 67 y.o. male who presents s/p aborted L CEA for proximal ICA disease not surgically accessible  The patient's neck incision is healing with no stroke symptoms. The patient is currently on an antiplatelet: ASA.  I gave him instructions to restart Plavix in 2 weeks.   The patient is currently  on a statin: Crestor.   The patient will follow up in 2 weeks to re-evaluate the left neck. Once he has healed up adequately, will proceed with with arrangement for a L CAS.  Leonides Sake, MD [facility] of [place] Office: [phone] Pager: [phone]

## 2013-08-05 ENCOUNTER — Encounter: Payer: Medicare Other | Admitting: Vascular Surgery

## 2013-08-11 ENCOUNTER — Encounter: Payer: Self-pay | Admitting: Vascular Surgery

## 2013-08-12 ENCOUNTER — Ambulatory Visit (INDEPENDENT_AMBULATORY_CARE_PROVIDER_SITE_OTHER): Payer: Self-pay | Admitting: Vascular Surgery

## 2013-08-12 ENCOUNTER — Encounter: Payer: Self-pay | Admitting: Vascular Surgery

## 2013-08-12 DIAGNOSIS — L24A9 Irritant contact dermatitis due friction or contact with other specified body fluids: Secondary | ICD-10-CM

## 2013-08-12 DIAGNOSIS — T148XXA Other injury of unspecified body region, initial encounter: Secondary | ICD-10-CM

## 2013-08-12 DIAGNOSIS — I6529 Occlusion and stenosis of unspecified carotid artery: Secondary | ICD-10-CM

## 2013-08-12 NOTE — Progress Notes (Signed)
[facility] OF [place]  Postoperative Visit  History of Present Illness  [name] is a 67 y.o. male who presents for postoperative follow-up for: aborted L CEA (Date: 07/20/13).  The patient's postop course was complicated by hematoma/seroma accumulation and drainage.  The patient's neck incision is nearly healed.  The patient has had no stroke or TIA symptoms.  For VQI Use Only  PRE-ADM LIVING: Home  AMB STATUS: Ambulatory  History   Social History  . Marital Status: Married    Spouse Name: N/A    Number of Children: N/A  . Years of Education: N/A   Occupational History  . Not on file.   Social History Main Topics  . Smoking status: Former Smoker    Quit date: 10/27/2001  . Smokeless tobacco: Not on file  . Alcohol Use: No  . Drug Use: No  . Sexual Activity: Not on file   Other Topics Concern  . Not on file   Social History Narrative  . No narrative on file    Current Outpatient Prescriptions on File Prior to Visit  Medication Sig Dispense Refill  . amLODipine-benazepril (LOTREL) 5-20 MG per capsule Take 1 capsule by mouth daily.       Marland Kitchen aspirin EC 81 MG tablet Take 81 mg by mouth daily.      . hydrochlorothiazide (HYDRODIURIL) 12.5 MG tablet Take 12.5 mg by mouth daily.       . Insulin Glargine (LANTUS SOLOSTAR) 100 UNIT/ML SOPN Inject 44 Units into the skin at bedtime.      . insulin lispro (HUMALOG PEN) 100 UNIT/ML SOPN Inject 10-15 Units into the skin See admin instructions. Takes 10 units at breakfast and lunch. Take 15 units at dinner; If blood sugar 150-200 take an additional 1 unit, if blood sugar 201-250 take an additional 2 units; If blood sugar 251-300 take an additional 3 units, if blood sugar >300 take an additional 4 units      . metFORMIN (GLUCOPHAGE-XR) 500 MG 24 hr tablet Take 500 mg by mouth 2 (two) times daily with a meal.       . metoprolol (LOPRESSOR) 50 MG tablet Take 50 mg by mouth 2 (two) times daily.      .  nitroGLYCERIN (NITROSTAT) 0.4 MG SL tablet Place 0.4 mg under the tongue every 5 (five) minutes as needed for chest pain.      . ONE TOUCH ULTRA TEST test strip as directed.      . rosuvastatin (CRESTOR) 20 MG tablet Take 20 mg by mouth at bedtime.      . cephALEXin (KEFLEX) 500 MG capsule Take 1 capsule (500 mg total) by mouth 4 (four) times daily.  40 capsule  0  . clopidogrel (PLAVIX) 75 MG tablet Take 1 tablet (75 mg total) by mouth daily with breakfast.  30 tablet  2  . esomeprazole (NEXIUM) 40 MG capsule Take 40 mg by mouth daily before breakfast.       . oxyCODONE-acetaminophen (PERCOCET/ROXICET) 5-325 MG per tablet Take 1 tablet by mouth every 6 (six) hours as needed for pain.  30 tablet  0   No current facility-administered medications on file prior to visit.    Physical Examination  Filed Vitals:   08/12/13 0847  BP: 145/82  Pulse:     L Neck: Incision is near healed with resolution of most of swelling  Neuro: CN 2-12 are intact , Motor strength is 5/5  bilaterally, sensation is  grossly  intact  Medical Decision Making  [name] is a 67 y.o. male who presents s/p aborted L CEA.  This patient's L CEA was aborted intraoperatively as there was not palpable endpoint for the CEA.  A mandibular osteotomy would have been necessary to try to find a more suitable endpoint.  Given the potential complications of such, I aborted the operation with the plan to consider carotid stent placement. I will now have Dr. Darrick Penna and Dr. Myra Gianotti evaluate the patient for possible CAS placement. A diagnostic B carotid and cerebral angiogram might be necessary prior to proceeding with such.  Thank you for allowing [place] to participate in this patient's care.  Leonides Sake, MD [facility] of [place] Office: [phone] Pager: [phone]

## 2013-08-16 NOTE — Discharge Summary (Signed)
Physician Discharge Summary  Patient ID: HAYES CZAJA MRN: [id number] DOB/AGE: 11-16-1945 67 y.o.  Admit date: 07/20/2013 Discharge date: 07/21/13  Admission Diagnosis: left internal carotid artery >80% stenosis  Discharge Diagnoses:  left internal carotid artery >80% stenosis  Secondary Diagnoses: Past Medical History  Diagnosis Date  . Hyperlipidemia   . Hypertension   . IHD (ischemic heart disease)     Prior CABG in 2003  . History of kidney stones   . Torn rotator cuff   . Normal nuclear stress test June 2012  . CAD (coronary artery disease)   . Dysrhythmia     skips beats  . Diabetes mellitus     fasting 100-120s  . Peripheral vascular disease   . GERD (gastroesophageal reflux disease)   . Dizziness   . Arthritis    Procedures: Left neck exploration (abort left carotid endarterectomy)  Consults: Urology (Dr. Marlou Porch)  History of Present Illness  [name] is a 67 y.o. (30-Aug-1946) male who presents with chief complaint: "tight left artery". This patient previous has undergone R CEA >10 years prior. On repeat carotid duplex today, he was found to have a high grade stenosis in the left internal carotid artery. Patient has a history of R amaurosis fugax. The patient has never had facial drooping or hemiplegia. The patient has never had receptive or expressive aphasia. The patient's previous neurologic deficits have resolved. He is asx currently. The patient's risks factors for carotid disease include: DM, hyperlipidemia, HTN, and prior smoking.    Discharged Condition: good  Hospital Course: He under went surgery for planned left carotid endarterectomy and was aborted secondary to no soft end point.  He was admitted to 3300.  Left neck incision with SS drainage. No fever, swallowing or speech difficulties.  Taking PO's well and ambulating.  Once he has healed up adequately, will proceed with with arrangement for a L CAS.  Significant Diagnostic Studies: CBC     Component Value Date/Time   WBC 8.5 07/21/2013 0415   RBC 4.60 07/21/2013 0415   HGB 14.3 07/21/2013 0415   HCT 40.7 07/21/2013 0415   PLT 166 07/21/2013 0415   MCV 88.5 07/21/2013 0415   MCH 31.1 07/21/2013 0415   MCHC 35.1 07/21/2013 0415   RDW 13.1 07/21/2013 0415   LYMPHSABS 1.8 09/23/2008 1920   MONOABS 0.7 09/23/2008 1920   EOSABS 0.1 09/23/2008 1920   BASOSABS 0.0 09/23/2008 1920    BMET    Component Value Date/Time   NA 140 07/21/2013 0415   K 3.5 07/21/2013 0415   CL 105 07/21/2013 0415   CO2 24 07/21/2013 0415   GLUCOSE 134* 07/21/2013 0415   BUN 16 07/21/2013 0415   CREATININE 0.82 07/21/2013 0415   CALCIUM 8.4 07/21/2013 0415   GFRNONAA >90 07/21/2013 0415   GFRAA >90 07/21/2013 0415    COAG Lab Results  Component Value Date   INR 1.03 07/19/2013   No results found for this basename: PTT    Disposition: 01-Home or Self Care  Discharge Orders   Future Orders Complete By Expires   Call MD for:  redness, tenderness, or signs of infection (pain, swelling, bleeding, redness, odor or green/yellow discharge around incision site)  As directed    Call MD for:  severe or increased pain, loss or decreased feeling  in affected limb(s)  As directed    Call MD for:  temperature >100.5  As directed    Driving Restrictions  As directed  Comments:     No driving for 2 weeks   Increase activity slowly  As directed    Comments:     Walk with assistance use walker or cane as needed   Lifting restrictions  As directed    Comments:     No lifting for 6 weeks   May shower   As directed    Resume previous diet  As directed        Medication List         amLODipine-benazepril 5-20 MG per capsule  Commonly known as:  LOTREL  Take 1 capsule by mouth daily.     aspirin EC 81 MG tablet  Take 81 mg by mouth daily.     clopidogrel 75 MG tablet  Commonly known as:  PLAVIX  Take 1 tablet (75 mg total) by mouth daily with breakfast.     esomeprazole 40 MG capsule  Commonly known  as:  NEXIUM  Take 40 mg by mouth daily before breakfast.     HUMALOG PEN 100 UNIT/ML Sopn  Generic drug:  insulin lispro  Inject 10-15 Units into the skin See admin instructions. Takes 10 units at breakfast and lunch. Take 15 units at dinner; If blood sugar 150-200 take an additional 1 unit, if blood sugar 201-250 take an additional 2 units; If blood sugar 251-300 take an additional 3 units, if blood sugar >300 take an additional 4 units     hydrochlorothiazide 12.5 MG tablet  Commonly known as:  HYDRODIURIL  Take 12.5 mg by mouth daily.     LANTUS SOLOSTAR 100 UNIT/ML Sopn  Generic drug:  Insulin Glargine  Inject 44 Units into the skin at bedtime.     metFORMIN 500 MG 24 hr tablet  Commonly known as:  GLUCOPHAGE-XR  Take 500 mg by mouth 2 (two) times daily with a meal.     metoprolol 50 MG tablet  Commonly known as:  LOPRESSOR  Take 50 mg by mouth 2 (two) times daily.     nitroGLYCERIN 0.4 MG SL tablet  Commonly known as:  NITROSTAT  Place 0.4 mg under the tongue every 5 (five) minutes as needed for chest pain.     ONE TOUCH ULTRA TEST test strip  Generic drug:  glucose blood  as directed.     oxyCODONE-acetaminophen 5-325 MG per tablet  Commonly known as:  PERCOCET/ROXICET  Take 1 tablet by mouth every 6 (six) hours as needed for pain.     rosuvastatin 20 MG tablet  Commonly known as:  CRESTOR  Take 20 mg by mouth at bedtime.           Follow-up Information   Follow up with Crist Fat, MD. (9 am 07/26/2013)    Specialty:  Urology   Contact information:   [address] [place] [address] [place] [address] [phone]       Follow up with Nilda Simmer, MD In 2 weeks. (sent)    Specialty:  Vascular Surgery   Contact information:   [address] [place] [address] [phone]       Signed: Clinton Gallant [facility] 08/16/2013, 8:27 AM  Addendum  I have independently interviewed and examined the patient, and I agree with the physician  assistant's discharge summary.  The patient underwent an attempt at L CEA for L ICA stenosis >80%.  Intra-operatively, the patient was found to have calcific atherosclerosis extending distal to the surgically accessible segment of the internal carotid artery.  Subsequently, I  aborted the procedure with the plan to consider left carotid artery stent placement.  By discharge, the patient had intact neurologic status with a small hematoma at the incision site, likely due to ASA and plavix use.  Leonides Sake, MD [facility] [place] Office: [phone] Pager: [phone]  08/16/2013, 12:18 PM

## 2013-08-24 ENCOUNTER — Other Ambulatory Visit: Payer: Self-pay | Admitting: *Deleted

## 2013-08-25 ENCOUNTER — Ambulatory Visit (INDEPENDENT_AMBULATORY_CARE_PROVIDER_SITE_OTHER): Payer: Self-pay | Admitting: Vascular Surgery

## 2013-08-25 ENCOUNTER — Encounter: Payer: Self-pay | Admitting: Vascular Surgery

## 2013-08-25 ENCOUNTER — Encounter (HOSPITAL_COMMUNITY): Payer: Self-pay | Admitting: Pharmacy Technician

## 2013-08-25 DIAGNOSIS — I6529 Occlusion and stenosis of unspecified carotid artery: Secondary | ICD-10-CM

## 2013-08-25 NOTE — Progress Notes (Signed)
Patient is a 67 year old male referred by Dr. Imogene Burn for possible left carotid stenting. The patient previously had a right carotid endarterectomy in the remote past. He recently underwent exploration of his left neck with the intent of left carotid endarterectomy for high-grade asymptomatic stenosis. The operation was aborted after palpating the carotid artery and the stenosis when significantly higher than anticipated. The patient remains asymptomatic with no symptoms of TIA amaurosis or stroke. He apparently had a hematoma in his neck postoperatively but this has now resolved. He was on a course of Keflex and this is now complete. He is on Plavix and aspirin.  Past Medical History  Diagnosis Date  . Hyperlipidemia   . Hypertension   . IHD (ischemic heart disease)     Prior CABG in 2003  . History of kidney stones   . Torn rotator cuff   . Normal nuclear stress test June 2012  . CAD (coronary artery disease)   . Dysrhythmia     skips beats  . Diabetes mellitus     fasting 100-120s  . Peripheral vascular disease   . GERD (gastroesophageal reflux disease)   . Dizziness   . Arthritis    Past Surgical History  Procedure Laterality Date  . Cardiac catheterization  05/03/2002    EF 40-45%  . Coronary artery bypass graft  04/2002  . Carotid endarterectomy    . Appendectomy    . Hernia repair    . Cardiovascular stress test  08/10/2006    EF 65%  . Doppler echocardiography  08/12/2002    EF 80-85%  . Shoulder surgery    . Endarterectomy Left 07/20/2013    Procedure: ATTEMPTED ENDARTERECTOMY CAROTID;  Surgeon: Fransisco Hertz, MD;  Location: [facility] OR;  Service: Vascular;  Laterality: Left;    Family History  Problem Relation Age of Onset  . Heart attack Mother   . Diabetes Mother   . Heart disease Mother   . Hypertension Mother   . Heart attack Father   . Heart disease Father   . Hypertension Father   . Diabetes Brother   . Diabetes Sister   . Diabetes Daughter   . Heart disease  Daughter   . Hypertension Daughter   . Diabetes Sister     History   Social History  . Marital Status: Married    Spouse Name: N/A    Number of Children: N/A  . Years of Education: N/A   Occupational History  . Not on file.   Social History Main Topics  . Smoking status: Former Smoker    Quit date: 10/27/2001  . Smokeless tobacco: Not on file  . Alcohol Use: No  . Drug Use: No  . Sexual Activity: Not on file   Other Topics Concern  . Not on file   Social History Narrative  . No narrative on file    Review of systems: He denies shortness of breath. He denies chest pain.  Physical exam:  Filed Vitals:   08/25/13 1230 08/25/13 1236  BP: 153/71 145/70  Pulse: 59   Height: 5\' 10"  (1.778 m)   Weight: 183 lb (83.008 kg)   SpO2: 97%     Neck: Well-healed right and left neck incisions no drainage no erythema, no carotid bruit Neuro: Symmetric upper and lower extremity motor strength 5 over 5  Data: Recent CT Angio done for pulmonary embolus was reviewed which shows a type I arch. Recent duplex scan greater than 80% left internal carotid artery stenosis,  prior right carotid endarterectomy patent less than 50% stenosis  Assessment: High-grade left internal carotid artery stenosis, asymptomatic not approachable to carotid endarterectomy due to anatomic distal extent of lesion  Plan: Left carotid stent tomorrow risks benefits possible complications and procedure details explained the patient and his family today. These were including but not limited to bleeding infection stroke risk of 2-5%. He understands and agrees to proceed.  Fabienne Bruns, MD [facility] [place] Office: [phone] Pager: [phone]

## 2013-08-26 ENCOUNTER — Encounter (HOSPITAL_COMMUNITY)
Admission: RE | Disposition: A | Discharge status: Home / Self Care | Payer: Medicare Other | Source: Ambulatory Visit | Attending: Vascular Surgery

## 2013-08-26 ENCOUNTER — Inpatient Hospital Stay (HOSPITAL_COMMUNITY)
Admission: RE | Admit: 2013-08-26 | Discharge: 2013-08-28 | DRG: 034 | Disposition: A | Discharge status: Home / Self Care | Payer: Medicare Other | Source: Ambulatory Visit | Attending: Vascular Surgery | Admitting: Vascular Surgery

## 2013-08-26 ENCOUNTER — Encounter (HOSPITAL_COMMUNITY): Payer: Self-pay | Admitting: Family Medicine

## 2013-08-26 DIAGNOSIS — K219 Gastro-esophageal reflux disease without esophagitis: Secondary | ICD-10-CM | POA: Diagnosis present

## 2013-08-26 DIAGNOSIS — E785 Hyperlipidemia, unspecified: Secondary | ICD-10-CM | POA: Diagnosis present

## 2013-08-26 DIAGNOSIS — I951 Orthostatic hypotension: Secondary | ICD-10-CM | POA: Diagnosis not present

## 2013-08-26 DIAGNOSIS — I10 Essential (primary) hypertension: Secondary | ICD-10-CM | POA: Diagnosis present

## 2013-08-26 DIAGNOSIS — M129 Arthropathy, unspecified: Secondary | ICD-10-CM | POA: Diagnosis present

## 2013-08-26 DIAGNOSIS — I6529 Occlusion and stenosis of unspecified carotid artery: Principal | ICD-10-CM | POA: Diagnosis present

## 2013-08-26 DIAGNOSIS — I2510 Atherosclerotic heart disease of native coronary artery without angina pectoris: Secondary | ICD-10-CM | POA: Diagnosis present

## 2013-08-26 DIAGNOSIS — I469 Cardiac arrest, cause unspecified: Secondary | ICD-10-CM | POA: Diagnosis not present

## 2013-08-26 DIAGNOSIS — IMO0001 Reserved for inherently not codable concepts without codable children: Secondary | ICD-10-CM | POA: Diagnosis present

## 2013-08-26 DIAGNOSIS — Z87891 Personal history of nicotine dependence: Secondary | ICD-10-CM

## 2013-08-26 DIAGNOSIS — Z951 Presence of aortocoronary bypass graft: Secondary | ICD-10-CM

## 2013-08-26 DIAGNOSIS — I739 Peripheral vascular disease, unspecified: Secondary | ICD-10-CM | POA: Diagnosis present

## 2013-08-26 HISTORY — PX: ARCH AORTOGRAM: SHX5501

## 2013-08-26 HISTORY — PX: CAROTID STENT INSERTION: SHX5505

## 2013-08-26 LAB — POCT I-STAT, CHEM 8
Calcium, Ion: 1.22 mmol/L (ref 1.13–1.30)
Chloride: 104 mEq/L (ref 96–112)
Glucose, Bld: 219 mg/dL — ABNORMAL HIGH (ref 70–99)
HCT: 50 % (ref 39.0–52.0)
Hemoglobin: 17 g/dL (ref 13.0–17.0)
Potassium: 4.3 mEq/L (ref 3.5–5.1)
TCO2: 24 mmol/L (ref 0–100)

## 2013-08-26 LAB — GLUCOSE, CAPILLARY
Glucose-Capillary: 251 mg/dL — ABNORMAL HIGH (ref 70–99)
Glucose-Capillary: 211 mg/dL — ABNORMAL HIGH (ref 70–99)

## 2013-08-26 LAB — POCT ACTIVATED CLOTTING TIME: Activated Clotting Time: 339 seconds

## 2013-08-26 SURGERY — CAROTID STENT INSERTION
Anesthesia: LOCAL | Laterality: Left

## 2013-08-26 MED ORDER — SODIUM CHLORIDE 0.45 % IV SOLN: INTRAVENOUS | Status: DC

## 2013-08-26 MED ORDER — GUAIFENESIN-DM 100-10 MG/5ML PO SYRP: 15.0000 mL | ORAL_SOLUTION | ORAL | Status: DC | PRN

## 2013-08-26 MED ORDER — MORPHINE SULFATE 2 MG/ML IJ SOLN: 2.0000 mg | INTRAMUSCULAR | Status: DC | PRN

## 2013-08-26 MED ORDER — DEXTROSE 5 % IV SOLN
1.5000 g | Freq: Two times a day (BID) | INTRAVENOUS | Status: AC
  Administered 2013-08-26 – 2013-08-27 (×2): 1.5 g via INTRAVENOUS
  Filled 2013-08-26 (×2): qty 1.5

## 2013-08-26 MED ORDER — CLOPIDOGREL BISULFATE 75 MG PO TABS
75.0000 mg | ORAL_TABLET | Freq: Every day | ORAL | Status: DC
  Administered 2013-08-27 – 2013-08-28 (×2): 75 mg via ORAL
  Filled 2013-08-26 (×2): qty 1

## 2013-08-26 MED ORDER — HEPARIN (PORCINE) IN NACL 2-0.9 UNIT/ML-% IJ SOLN
INTRAMUSCULAR | Status: AC
  Filled 2013-08-26: qty 1000

## 2013-08-26 MED ORDER — INSULIN ASPART 100 UNIT/ML ~~LOC~~ SOLN
0.0000 [IU] | Freq: Three times a day (TID) | SUBCUTANEOUS | Status: DC
  Administered 2013-08-27: 5 [IU] via SUBCUTANEOUS
  Administered 2013-08-27: 3 [IU] via SUBCUTANEOUS
  Administered 2013-08-27: 8 [IU] via SUBCUTANEOUS
  Administered 2013-08-28: 2 [IU] via SUBCUTANEOUS

## 2013-08-26 MED ORDER — DOPAMINE-DEXTROSE 3.2-5 MG/ML-% IV SOLN
3.0000 ug/kg/min | INTRAVENOUS | Status: DC
  Administered 2013-08-26: 3 ug/kg/min via INTRAVENOUS

## 2013-08-26 MED ORDER — SODIUM CHLORIDE 0.9 % IV SOLN: INTRAVENOUS | Status: DC

## 2013-08-26 MED ORDER — POTASSIUM CHLORIDE CRYS ER 20 MEQ PO TBCR: 20.0000 meq | EXTENDED_RELEASE_TABLET | Freq: Once | ORAL | Status: AC | PRN

## 2013-08-26 MED ORDER — BIVALIRUDIN 250 MG IV SOLR
INTRAVENOUS | Status: AC
  Filled 2013-08-26: qty 250

## 2013-08-26 MED ORDER — SODIUM CHLORIDE 0.9 % IV SOLN: Freq: Once | INTRAVENOUS | Status: DC

## 2013-08-26 MED ORDER — NITROGLYCERIN 0.4 MG SL SUBL: 0.4000 mg | SUBLINGUAL_TABLET | SUBLINGUAL | Status: DC | PRN

## 2013-08-26 MED ORDER — ASPIRIN EC 81 MG PO TBEC
81.0000 mg | DELAYED_RELEASE_TABLET | Freq: Every day | ORAL | Status: DC
  Administered 2013-08-27 – 2013-08-28 (×2): 81 mg via ORAL
  Filled 2013-08-26 (×2): qty 1

## 2013-08-26 MED ORDER — ALUM & MAG HYDROXIDE-SIMETH 200-200-20 MG/5ML PO SUSP: 15.0000 mL | ORAL | Status: DC | PRN

## 2013-08-26 MED ORDER — DOPAMINE-DEXTROSE 3.2-5 MG/ML-% IV SOLN
INTRAVENOUS | Status: AC
  Filled 2013-08-26: qty 250

## 2013-08-26 MED ORDER — TRAMADOL HCL 50 MG PO TABS: 50.0000 mg | ORAL_TABLET | Freq: Four times a day (QID) | ORAL | Status: DC | PRN

## 2013-08-26 MED ORDER — ACETAMINOPHEN 325 MG PO TABS: 325.0000 mg | ORAL_TABLET | ORAL | Status: DC | PRN

## 2013-08-26 MED ORDER — HYDROCHLOROTHIAZIDE 25 MG PO TABS
12.5000 mg | ORAL_TABLET | Freq: Every day | ORAL | Status: DC
  Filled 2013-08-26: qty 0.5

## 2013-08-26 MED ORDER — LABETALOL HCL 5 MG/ML IV SOLN: 10.0000 mg | INTRAVENOUS | Status: DC | PRN

## 2013-08-26 MED ORDER — PHENOL 1.4 % MT LIQD: 1.0000 | OROMUCOSAL | Status: DC | PRN

## 2013-08-26 MED ORDER — NOREPINEPHRINE BITARTRATE 1 MG/ML IJ SOLN
INTRAMUSCULAR | Status: AC
  Filled 2013-08-26: qty 4

## 2013-08-26 MED ORDER — INSULIN GLARGINE 100 UNIT/ML ~~LOC~~ SOLN
44.0000 [IU] | Freq: Every day | SUBCUTANEOUS | Status: DC
  Administered 2013-08-26 – 2013-08-27 (×2): 44 [IU] via SUBCUTANEOUS
  Filled 2013-08-26 (×3): qty 0.44

## 2013-08-26 MED ORDER — ONDANSETRON HCL 4 MG/2ML IJ SOLN: 4.0000 mg | Freq: Four times a day (QID) | INTRAMUSCULAR | Status: DC | PRN

## 2013-08-26 MED ORDER — ATROPINE SULFATE 0.1 MG/ML IJ SOLN
INTRAMUSCULAR | Status: AC
  Filled 2013-08-26: qty 10

## 2013-08-26 MED ORDER — METOPROLOL TARTRATE 1 MG/ML IV SOLN: 2.0000 mg | INTRAVENOUS | Status: DC | PRN

## 2013-08-26 MED ORDER — PANTOPRAZOLE SODIUM 40 MG PO TBEC
40.0000 mg | DELAYED_RELEASE_TABLET | Freq: Every day | ORAL | Status: DC
  Administered 2013-08-27 – 2013-08-28 (×2): 40 mg via ORAL
  Filled 2013-08-26 (×2): qty 1

## 2013-08-26 MED ORDER — INSULIN LISPRO 100 UNIT/ML (KWIKPEN): 10.0000 [IU] | PEN_INJECTOR | SUBCUTANEOUS | Status: DC

## 2013-08-26 MED ORDER — LIDOCAINE HCL (PF) 1 % IJ SOLN
INTRAMUSCULAR | Status: AC
  Filled 2013-08-26: qty 30

## 2013-08-26 MED ORDER — SODIUM CHLORIDE 0.9 % IV SOLN: 500.0000 mL | Freq: Once | INTRAVENOUS | Status: AC | PRN

## 2013-08-26 MED ORDER — ACETAMINOPHEN 650 MG RE SUPP: 325.0000 mg | RECTAL | Status: DC | PRN

## 2013-08-26 MED ORDER — MAGNESIUM SULFATE 40 MG/ML IJ SOLN
2.0000 g | Freq: Once | INTRAMUSCULAR | Status: AC | PRN
  Filled 2013-08-26: qty 50

## 2013-08-26 MED ORDER — METOPROLOL TARTRATE 50 MG PO TABS
50.0000 mg | ORAL_TABLET | Freq: Two times a day (BID) | ORAL | Status: DC
  Filled 2013-08-26 (×4): qty 1

## 2013-08-26 MED ORDER — INSULIN GLARGINE 100 UNIT/ML SOLOSTAR PEN: 44.0000 [IU] | PEN_INJECTOR | Freq: Every day | SUBCUTANEOUS | Status: DC

## 2013-08-26 MED ORDER — HYDRALAZINE HCL 20 MG/ML IJ SOLN: 10.0000 mg | INTRAMUSCULAR | Status: DC | PRN

## 2013-08-26 NOTE — Progress Notes (Signed)
Utilization Review Completed.[name] T10/31/2014  

## 2013-08-26 NOTE — Op Note (Signed)
Procedure: Left carotid angiogram, left carotid angioplasty and stenting   Preoperative diagnosis: High-grade left internal carotid artery stenosis, asymptomatic Postoperative diagnosis: Same   Indications: The patient recently had and attempt at open left carotid endarterectomy. It was felt by the operative surgeon that the stenosis extended passed the level that was obtainable from a cervical excision. The patient was referred for elective carotid stenting.   Anesthesia: Local  Co-surgeon: Nanetta Batty M.D.   Operative findings: #1 80% stenosis left internal carotid artery moderately calcified                                #2 pre dilation with a 3 x 2 angioplasty balloon, large filter, 9 x 7 x 40 Abbott stent, post dilation                                               with 5 x 2 balloon                                #3 brief episode of asystole lasting 15 seconds recovered with atropine fluid and dopamine  Operative details: After obtaining informed consent, the patient was taken to the PV lab. The patient was placed in supine position the Angio table. Both groins were prepped and draped in usual sterile fashion. Local anesthesia was infiltrated over the right common femoral artery. Ultrasound was used to identify the right common femoral artery. An introducer needle was placed into the right common femoral artery under ultrasound guidance and there was good backbleeding from this. A 0.035 Versacore wire was then threaded up the abdominal aorta under fluoroscopic guidance. A short 6 French sheath was placed over the guidewire and a right femoral artery. This was thoroughly flushed with heparinized saline. A 5 French pigtail catheter was then placed over the guidewire advanced up into a sitting aorta. An arch aortogram was then obtained.  This shows norm arch anatomy with a type I arch. There is approximately 25% stenosis of the origin of the left common carotid. The innominate is patent. The  origin of the right common and right subclavian arteries is patent. The left subclavian artery and left vertebral arteries patent. The right vertebral artery is patent. Next, a 6 [place] Cook shuttle sheath was then brought up on the operative field and advanced over the guidewire up into the ascending aorta. At this point an infusion of Angiomax was begun.A 5 French JB1 catheter was then advanced up into the aortic arch and a contrast angiogram was performed to determine the precise location of the left common carotid artery. Using the JB1 catheter I then advanced the tip of the catheter into the left common carotid artery. The Versacore wire was removed and exchanged for an 035 Amplatz wire. The Amplatz wire was gently advanced up into the common carotid artery. The JB1 catheter was  left in place and the sheath  advanced over the guidewire into the left common carotid artery.  ACT was also checked and was 339. At this point an Abbott NAV 6 filter was brought on the operative field and this was advanced up to the level of the carotid stenosis.  With some manipulation the guidewire advanced through the stenosis with minimal  difficulty. The filter was then advanced into the distal internal carotid artery at the level of the skull base. The filter was then deployed in this location. At this point a repeat contrast angiogram was performed to again determined the level of the stenosis. A 3 x 2 angioplasty balloon was brought in operative field and advanced to the level of stenosis and quickly inflated and deflated to 6 atmospheres.  At this point the patient experienced a brief episode of asystole. He was aroused immediately with stimulation as well as administration of 2 mg of atropine and administration of dopamine IV. He was briefly hypotensive and in his blood pressure fairly quickly recovered into the low 90s systolic. We waited until his systolic blood pressure was back above 100 before bringing the stent in the  operative field. The predilatation balloon was removed. A 9 x 7 x 40  Abbott Xact stent was then brought in the operative field and advanced to the level of stenosis. Again using angiographic guidance, the stent was deployed so that the tip of the stent was past level of the high-grade stenosis. The stent was then deployed. The patient was given 1 additional milligram of atropine. A postdilatation was then performed after the stent delivery device was removed. This was done with a 5 x 2 balloon inflated to 6 atmospheres up and down. There is no asystole with this balloon inflation and the patient actually began to become hypertensive and tachycardic at this point. We stopped the dopamine at that point.. A completion arteriogram was performed which showed that the residual stenosis was essentially 0 and the stent had good wall apposition. There was one streak of extraluminal contrast in the midportion of the stent but no obvious dissection and no extravasation of contrast.  Completion intracranial views were also performed in AP and lateral projection to be interpreted later by the neuroradiologist.  There was a shelf of calcium in the intracranial circulation but no guidewires had been advanced this far distally into the head and this did not appear to be embolic.The patient tolerated procedure well and there were no complications. After the completion arteriogram had been performed, the sheath dilator was placed back in the sheath over a guidewire and these were then pulled down to the guidewire and the sheath exchanged for a 6 French short sheath in the right femoral artery. This was thoroughly flushed with heparinized saline and was left in place to be pulled in the holding area after the ACT is less than 175. The patient tolerated the procedure well and there were no neurologic complications. The patient was transported to the holding area in stable condition. He was conversing normally and moving his upper and  lower extremities symmetrically at conclusion of the case.   Fabienne Bruns, MD  [facility] [place]  Office: [phone]  Pager: [phone]

## 2013-08-26 NOTE — Interval H&P Note (Signed)
History and Physical Interval Note:  08/26/2013 7:02 AM  [name]  has presented today for surgery, with the diagnosis of Stenosis  The various methods of treatment have been discussed with the patient and family. After consideration of risks, benefits and other options for treatment, the patient has consented to  Procedure(s): CAROTID STENT INSERTION (N/A) as a surgical intervention .  The patient's history has been reviewed, patient examined, no change in status, stable for surgery.  I have reviewed the patient's chart and labs.  Questions were answered to the patient's satisfaction.     FIELDS,CHARLES E

## 2013-08-26 NOTE — H&P (View-Only) (Signed)
Patient is a 66-year-old male referred by Dr. Chen for possible left carotid stenting. The patient previously had a right carotid endarterectomy in the remote past. He recently underwent exploration of his left neck with the intent of left carotid endarterectomy for high-grade asymptomatic stenosis. The operation was aborted after palpating the carotid artery and the stenosis when significantly higher than anticipated. The patient remains asymptomatic with no symptoms of TIA amaurosis or stroke. He apparently had a hematoma in his neck postoperatively but this has now resolved. He was on a course of Keflex and this is now complete. He is on Plavix and aspirin.  Past Medical History  Diagnosis Date  . Hyperlipidemia   . Hypertension   . IHD (ischemic heart disease)     Prior CABG in 2003  . History of kidney stones   . Torn rotator cuff   . Normal nuclear stress test June 2012  . CAD (coronary artery disease)   . Dysrhythmia     skips beats  . Diabetes mellitus     fasting 100-120s  . Peripheral vascular disease   . GERD (gastroesophageal reflux disease)   . Dizziness   . Arthritis    Past Surgical History  Procedure Laterality Date  . Cardiac catheterization  05/03/2002    EF 40-45%  . Coronary artery bypass graft  04/2002  . Carotid endarterectomy    . Appendectomy    . Hernia repair    . Cardiovascular stress test  08/10/2006    EF 65%  . Doppler echocardiography  08/12/2002    EF 80-85%  . Shoulder surgery    . Endarterectomy Left 07/20/2013    Procedure: ATTEMPTED ENDARTERECTOMY CAROTID;  Surgeon: Brian L Chen, MD;  Location: [facility] OR;  Service: Vascular;  Laterality: Left;    Family History  Problem Relation Age of Onset  . Heart attack Mother   . Diabetes Mother   . Heart disease Mother   . Hypertension Mother   . Heart attack Father   . Heart disease Father   . Hypertension Father   . Diabetes Brother   . Diabetes Sister   . Diabetes Daughter   . Heart disease  Daughter   . Hypertension Daughter   . Diabetes Sister     History   Social History  . Marital Status: Married    Spouse Name: N/A    Number of Children: N/A  . Years of Education: N/A   Occupational History  . Not on file.   Social History Main Topics  . Smoking status: Former Smoker    Quit date: 10/27/2001  . Smokeless tobacco: Not on file  . Alcohol Use: No  . Drug Use: No  . Sexual Activity: Not on file   Other Topics Concern  . Not on file   Social History Narrative  . No narrative on file    Review of systems: He denies shortness of breath. He denies chest pain.  Physical exam:  Filed Vitals:   08/25/13 1230 08/25/13 1236  BP: 153/71 145/70  Pulse: 59   Height: 5' 10" (1.778 m)   Weight: 183 lb (83.008 kg)   SpO2: 97%     Neck: Well-healed right and left neck incisions no drainage no erythema, no carotid bruit Neuro: Symmetric upper and lower extremity motor strength 5 over 5  Data: Recent CT Angio done for pulmonary embolus was reviewed which shows a type I arch. Recent duplex scan greater than 80% left internal carotid artery stenosis,   prior right carotid endarterectomy patent less than 50% stenosis  Assessment: High-grade left internal carotid artery stenosis, asymptomatic not approachable to carotid endarterectomy due to anatomic distal extent of lesion  Plan: Left carotid stent tomorrow risks benefits possible complications and procedure details explained the patient and his family today. These were including but not limited to bleeding infection stroke risk of 2-5%. He understands and agrees to proceed.  Audie Stayer, MD [facility] [place] Office: [phone] Pager: [phone]  

## 2013-08-27 LAB — GLUCOSE, CAPILLARY
Glucose-Capillary: 172 mg/dL — ABNORMAL HIGH (ref 70–99)
Glucose-Capillary: 283 mg/dL — ABNORMAL HIGH (ref 70–99)
Glucose-Capillary: 213 mg/dL — ABNORMAL HIGH (ref 70–99)
Glucose-Capillary: 200 mg/dL — ABNORMAL HIGH (ref 70–99)

## 2013-08-27 LAB — CBC
HCT: 38.6 % — ABNORMAL LOW (ref 39.0–52.0)
MCHC: 33.7 g/dL (ref 30.0–36.0)
MCV: 90.2 fL (ref 78.0–100.0)
Platelets: 152 10*3/uL (ref 150–400)
RBC: 4.28 MIL/uL (ref 4.22–5.81)
WBC: 8.3 10*3/uL (ref 4.0–10.5)

## 2013-08-27 MED ORDER — SODIUM CHLORIDE 0.9 % IV BOLUS (SEPSIS)
500.0000 mL | Freq: Once | INTRAVENOUS | Status: AC
  Administered 2013-08-27: 500 mL via INTRAVENOUS

## 2013-08-27 NOTE — Progress Notes (Signed)
This note also relates to the following rows which could not be included: BP - Cannot attach notes to unvalidated device data MAP (mmHg) - Cannot attach notes to unvalidated device data Pulse Rate - Cannot attach notes to unvalidated device data ECG Heart Rate - Cannot attach notes to unvalidated device data Resp - Cannot attach notes to unvalidated device data SpO2 - Cannot attach notes to dizziness and lightheaded

## 2013-08-27 NOTE — Consult Note (Signed)
NAMEDORION, PETILLO                 ACCOUNT NO.:  [id number]  MEDICAL RECORD NO.:  [id number]  LOCATION:  3S10C                        FACILITY:  [facility]  PHYSICIAN:  Haziel Molner K. Cezar Misiaszek, M.D.DATE OF BIRTH:  [birth year]  DATE OF CONSULTATION: DATE OF DISCHARGE:                                CONSULTATION   Examination is intracranial interpretation of left common carotid arteriogram before and after placement of left internal carotid artery proximal stent.  Only the post stent placement intracranial images are provided for interpretation.  The left internal carotid artery angiogram demonstrate the cervical petrous junction to be normal.  There is wide patency of the petrous, cavernous, and supraclinoid segments.  The left middle cerebral artery and the left anterior cerebral artery opacify into the capillary and venous phases.  There is no gross evidence of vessel occlusions or intraluminal filling defects.  No stasis of contrast is seen.  IMPRESSION: 1. The post stent placement arteriogram, injection of the left common     carotid artery demonstrates no gross intraluminal filling defects     or dissections or occlusions of the anterior circulation as     described above. 2. No pre-stent placement intracranial arteriogram images are provided     for interpretation.          ______________________________ Grandville Silos Corliss Skains, M.D.     SKD/MEDQ  D:  08/26/2013  T:  08/27/2013  Job:  [id number]

## 2013-08-27 NOTE — Progress Notes (Addendum)
[facility] Progress Note  08/27/2013 7:49 AM 1 Day Post-Op  Subjective:  Feels better this morning than he did yesterday.  States he hasn't walked b/c he is dizzy when he gets up.  Afebrile HR  40's regular 60's-110's systolic (most recent at 0400 99 systolic)  Filed Vitals:   08/27/13 0430  BP: 99/59  Pulse: 48  Temp: 98.3 F (36.8 C)  Resp: 17     Physical Exam: Neuro:  In tact Incision:  Right groin is soft without hematoma Lungs:  CTAB Cardiac regular  CBC    Component Value Date/Time   WBC 8.5 07/21/2013 0415   RBC 4.60 07/21/2013 0415   HGB 17.0 08/26/2013 0555   HCT 50.0 08/26/2013 0555   PLT 166 07/21/2013 0415   MCV 88.5 07/21/2013 0415   MCH 31.1 07/21/2013 0415   MCHC 35.1 07/21/2013 0415   RDW 13.1 07/21/2013 0415   LYMPHSABS 1.8 09/23/2008 1920   MONOABS 0.7 09/23/2008 1920   EOSABS 0.1 09/23/2008 1920   BASOSABS 0.0 09/23/2008 1920    BMET    Component Value Date/Time   NA 139 08/26/2013 0555   K 4.3 08/26/2013 0555   CL 104 08/26/2013 0555   CO2 24 07/21/2013 0415   GLUCOSE 219* 08/26/2013 0555   BUN 16 08/26/2013 0555   CREATININE 0.90 08/26/2013 0555   CALCIUM 8.4 07/21/2013 0415   GFRNONAA >90 07/21/2013 0415   GFRAA >90 07/21/2013 0415     Intake/Output Summary (Last 24 hours) at 08/27/13 0749 Last data filed at 08/27/13 0431  Gross per 24 hour  Intake    625 ml  Output   1300 ml  Net   -675 ml      Assessment/Plan:  This is a 67 y.o. male who is s/p Left carotid angiogram, left carotid angioplasty and stenting  1 Day Post-Op  -pt is doing well this am.  Still on of dopamine (down from ).  Wean as tolerated.   -pt neuro exam is in tact -pt has not ambulated.  -wean dopamine and OOB as tolerated.  Pt is orthostatic-BP 170 lying; 150 sitting and 85 standing.  May need fluid bolus. -PT states his HR is usually in the 60's and BP 130's.  He has been bradycardic with HR in the 40's.  Hold Metoprolol until HR  rebounds.  Will also hold HCTZ and Lotrel.  Pt states his daughter is an Charity fundraiser and can monitor his BP at home.   -ambulate and advance diet this am as tolerated.   Doreatha Massed, PA-C [facility] [phone] Agree with above assessment Neurologic exam normal No dizziness this morning while sitting although blood pressure did decrease into the high 80s systolic initially  Will wean and DC dopamine as above and if blood pressure satisfactory DC home. If not we will give fluid bolus and then hope to DC home later today on Plavix

## 2013-08-28 NOTE — Discharge Summary (Signed)
[facility] Discharge Summary  [name] [birth year] 67 y.o. male  [id number]  Admission Date: 08/26/2013  Discharge Date: 08/28/13  Physician: Sherren Kerns, MD  Admission Diagnosis: Stenosis   HPI:   This is a 67 y.o. male referred by Dr. Imogene Burn for possible left carotid stenting. The patient previously had a right carotid endarterectomy in the remote past. He recently underwent exploration of his left neck with the intent of left carotid endarterectomy for high-grade asymptomatic stenosis. The operation was aborted after palpating the carotid artery and the stenosis when significantly higher than anticipated. The patient remains asymptomatic with no symptoms of TIA amaurosis or stroke. He apparently had a hematoma in his neck postoperatively but this has now resolved. He was on a course of Keflex and this is now complete. He is on Plavix and aspirin.  Hospital Course:  The patient was admitted to the hospital and taken to the operating room on 08/26/2013 and underwent Left carotid angiogram, left carotid angioplasty and stenting.  During the procedure, he did have a brief episode of asystole lasting 15 seconds recovered with atropine, fluid, and dopamine.  The pt tolerated the procedure well and was transported to the PACU in good condition.   By POD 1, the pt neuro status in tact.  He was requiring dopamine for his hypotension and oral antihypertensives were held.  He had orthostatic hypotension and was dizzy with standing.  He received a total of 1L fluid bolus and the dopamine was weaned successfully around 1630 POD 1.  By POD 2, his was completely asymptomatic.  He is given parameters on resuming his BP medications as follows:  May take metoprolol when systolic blood pressure (top number) is greater than 100 and heart rate is greater than 60. Resume other BP medications when systolic blood pressure (top number) greater than 110.  The remainder of the hospital  course consisted of increasing mobilization and increasing intake of solids without difficulty.  Recent Labs  08/26/13 0555  NA 139  K 4.3  CL 104  GLUCOSE 219*  BUN 16    Recent Labs  08/26/13 0555 08/27/13 1020  WBC  --  8.3  HGB 17.0 13.0  HCT 50.0 38.6*  PLT  --  152   No results found for this basename: INR,  in the last 72 hours  Discharge Instructions:   The patient is discharged to home with extensive instructions on wound care and progressive ambulation.  They are instructed not to drive or perform any heavy lifting until returning to see the physician in his office.  Discharge Orders   Future Orders Complete By Expires   Call MD for:  redness, tenderness, or signs of infection (pain, swelling, bleeding, redness, odor or green/yellow discharge around incision site)  As directed    Call MD for:  severe or increased pain, loss or decreased feeling  in affected limb(s)  As directed    Call MD for:  temperature >100.5  As directed    CAROTID Sugery: Call MD for difficulty swallowing or speaking; weakness in arms or legs that is a new symtom; severe headache.  If you have increased swelling in the neck and/or  are having difficulty breathing, CALL 911  As directed    Discharge instructions  As directed    Comments:     Resume your metoprolol when your systolic blood pressure (top number) is greater than 100 and your heart rate is greater than 60. Resume other BP  medications when systolic  blood pressure (top number)  greater than 110.   Discharge wound care:  As directed    Comments:     Shower daily with soap and water starting 08/27/13   Driving Restrictions  As directed    Comments:     No driving for 2 weeks   Lifting restrictions  As directed    Comments:     No lifting for 2 weeks   Resume previous diet  As directed       Discharge Diagnosis:  Stenosis  Secondary Diagnosis: Patient Active Problem List   Diagnosis Date Noted  . Wound drainage 07/25/2013   . Occlusion and stenosis of carotid artery without mention of cerebral infarction 07/15/2013  . Right leg claudication 07/08/2013  . Right carotid bruit 07/08/2013  . Type II or unspecified type diabetes mellitus without mention of complication, uncontrolled 12/18/2011  . CAD (coronary artery disease) 06/19/2011  . HTN (hypertension) 06/19/2011  . Hyperlipidemia 06/19/2011   Past Medical History  Diagnosis Date  . Hyperlipidemia   . Hypertension   . IHD (ischemic heart disease)     Prior CABG in 2003  . History of kidney stones   . Torn rotator cuff   . Normal nuclear stress test June 2012  . CAD (coronary artery disease)   . Dysrhythmia     skips beats  . Diabetes mellitus     fasting 100-120s  . Peripheral vascular disease   . GERD (gastroesophageal reflux disease)   . Dizziness   . Arthritis       Medication List         amLODipine-benazepril 5-20 MG per capsule  Commonly known as:  LOTREL  Take 1 capsule by mouth daily.     aspirin EC 81 MG tablet  Take 81 mg by mouth daily.     clopidogrel 75 MG tablet  Commonly known as:  PLAVIX  Take 75 mg by mouth daily.     HUMALOG PEN 100 UNIT/ML Sopn  Generic drug:  insulin lispro  Inject 10-15 Units into the skin See admin instructions. Takes 10 units at breakfast and lunch. Take 15 units at dinner; If blood sugar 150-200 take an additional 1 unit, if blood sugar 201-250 take an additional 2 units; If blood sugar 251-300 take an additional 3 units, if blood sugar >300 take an additional 4 units     hydrochlorothiazide 12.5 MG tablet  Commonly known as:  HYDRODIURIL  Take 12.5 mg by mouth daily.     LANTUS SOLOSTAR 100 UNIT/ML Sopn  Generic drug:  Insulin Glargine  Inject 44 Units into the skin at bedtime.     metFORMIN 500 MG 24 hr tablet  Commonly known as:  GLUCOPHAGE-XR  Take 500 mg by mouth 2 (two) times daily with a meal.     metoprolol 50 MG tablet  Commonly known as:  LOPRESSOR  Take 50 mg by mouth 2  (two) times daily.     nitroGLYCERIN 0.4 MG SL tablet  Commonly known as:  NITROSTAT  Place 0.4 mg under the tongue every 5 (five) minutes as needed for chest pain.     omeprazole 20 MG capsule  Commonly known as:  PRILOSEC  Take 20 mg by mouth daily.     rosuvastatin 20 MG tablet  Commonly known as:  CRESTOR  Take 20 mg by mouth at bedtime.        Disposition: home with instructions on resuming oral BP medications as  follows:  May take metoprolol when systolic blood pressure (top number) is greater than 100 and heart rate is greater than 60. Resume other BP medications when systolic blood pressure (top number) greater than 110. Will f/u with Dr. Rosezetta Schlatter if BP continues to run low.  Patient's condition: is Good  Follow up: 1. Dr.  Darrick Penna in 4 weeks with carotid duplex   Doreatha Massed, PA-C [facility] [phone]    --- For [facility] Registry use ---  Modified Rankin score at D/C (0-6): 0  IV medication needed for:  1. Hypertension: No 2. Hypotension: Yes  Post-op Complications: No  1. Post-op CVA or TIA: No  If yes: Event classification (right eye, left eye, right cortical, left cortical, verterobasilar, other): n/a  If yes: Timing of event (intra-op, <6 hrs post-op, >=6 hrs post-op, unknown): n/a  2. CN injury: No  If yes: CN n/a injuried   3. Myocardial infarction: No  If yes: Dx by (EKG or clinical, Troponin): n/a  4.  CHF: No  5.  Dysrhythmia (new): No  6. Wound infection: No   7. Reperfusion symptoms: No  8. Return to OR: No  If yes: return to OR for (bleeding, neurologic, other CEA incision, other): no  Discharge medications: Statin use:  Yes If No: [ ]  For Medical reasons, [ ]  Non-compliant, [ ]  Not-indicated ASA use:  Yes  If No: [ ]  For Medical reasons, [ ]  Non-compliant, [ ]  Not-indicated Beta blocker use:  Yes If No: [ ]  For Medical reasons, [ ]  Non-compliant, [ ]  Not-indicated ACE-Inhibitor use:  Yes If No: [ ]  For  Medical reasons, [ ]  Non-compliant, [ ]  Not-indicated P2Y12 Antagonist use: yes, [ x] Plavix, [ ]  Plasugrel, [ ]  Ticlopinine, [ ]  Ticagrelor, [ ]  Other, [ ]  No for medical reason, [ ]  Non-compliant, [ ]  Not-indicated Anti-coagulant use:  no, [ ]  Warfarin, [ ]  Rivaroxaban, [ ]  Dabigatran, [ ]  Other, [ ]  No for medical reason, [ ]  Non-compliant, [ ]  Not-indicated

## 2013-08-28 NOTE — Progress Notes (Signed)
Patient being discharged home per MD order, All instructions gone over with patient and daughter, verbalized understanding. VSS,

## 2013-08-28 NOTE — Progress Notes (Signed)
[facility] Progress Note  08/28/2013 8:04 AM 2 Days Post-Op  Subjective:  Feeling better.  No dizziness  afebrile HR 40's-60 Systolic 80's-110's 94% RA  Filed Vitals:   08/28/13 0800  BP: 111/57  Pulse:   Temp: 98.2 F (36.8 C)  Resp:      Physical Exam: Neuro:  In tact Incision:  Right groin is soft NT/ND Lungs:  Non labored Abdomen:  Soft, NT/ND; denies N/V  CBC    Component Value Date/Time   WBC 8.3 08/27/2013 1020   RBC 4.28 08/27/2013 1020   HGB 13.0 08/27/2013 1020   HCT 38.6* 08/27/2013 1020   PLT 152 08/27/2013 1020   MCV 90.2 08/27/2013 1020   MCH 30.4 08/27/2013 1020   MCHC 33.7 08/27/2013 1020   RDW 13.2 08/27/2013 1020   LYMPHSABS 1.8 09/23/2008 1920   MONOABS 0.7 09/23/2008 1920   EOSABS 0.1 09/23/2008 1920   BASOSABS 0.0 09/23/2008 1920    BMET    Component Value Date/Time   NA 139 08/26/2013 0555   K 4.3 08/26/2013 0555   CL 104 08/26/2013 0555   CO2 24 07/21/2013 0415   GLUCOSE 219* 08/26/2013 0555   BUN 16 08/26/2013 0555   CREATININE 0.90 08/26/2013 0555   CALCIUM 8.4 07/21/2013 0415   GFRNONAA >90 07/21/2013 0415   GFRAA >90 07/21/2013 0415     Intake/Output Summary (Last 24 hours) at 08/28/13 0804 Last data filed at 08/28/13 0800  Gross per 24 hour  Intake   1150 ml  Output   2300 ml  Net  -1150 ml      Assessment/Plan:  This is a 67 y.o. male who is s/p Left carotid angiogram, left carotid angioplasty and stenting   2 Days Post-Op  -pt is doing well this am. -pt neuro exam is in tact -pt has ambulated -he is no longer symptomatic when standing/walking.   -off dopamine yesterday at 1630.  Did receive 1L fluid bolus for BP. -will have pt take BP at home-will hold metoprolol if systolic < 100 and HR < 60.  Other BP meds resume when systolic > 110 (this was discussed with pt and put in the discharge instructions). -discharge home today. -will f/u with Dr. Rosezetta Schlatter to adjust BP meds if it continues to run  low.   Doreatha Massed, PA-C [facility] [phone]

## 2013-08-29 ENCOUNTER — Telehealth: Payer: Self-pay | Admitting: Vascular Surgery

## 2013-08-29 ENCOUNTER — Other Ambulatory Visit: Payer: Self-pay | Admitting: *Deleted

## 2013-08-29 DIAGNOSIS — Z48812 Encounter for surgical aftercare following surgery on the circulatory system: Secondary | ICD-10-CM

## 2013-08-29 NOTE — Telephone Encounter (Addendum)
Message copied by Fredrich Birks on Mon Aug 29, 2013 11:36 AM ------      Message from: Lorin Mercy K      Created: Mon Aug 29, 2013  8:18 AM      Regarding: schedule                   ----- Message -----         From: Dara Lords, PA-C         Sent: 08/26/2013  12:05 PM           To: Sharee Pimple, CMA            S/p left carotid angioplasty and stenting 08/26/13.  F/u with Dr. Darrick Penna in 4 weeks with carotid duplex.            Thanks,      Samantha ------  08/29/13: spoke with patient and mailed letter, dpm

## 2013-08-30 MED FILL — Sodium Chloride IV Soln 0.9%: INTRAVENOUS | Qty: 50 | Status: AC

## 2013-09-01 ENCOUNTER — Other Ambulatory Visit: Payer: Self-pay

## 2013-10-05 ENCOUNTER — Encounter: Payer: Self-pay | Admitting: Vascular Surgery

## 2013-10-06 ENCOUNTER — Encounter: Payer: Self-pay | Admitting: Vascular Surgery

## 2013-10-06 ENCOUNTER — Ambulatory Visit (HOSPITAL_COMMUNITY)
Admission: RE | Admit: 2013-10-06 | Discharge: 2013-10-06 | Disposition: A | Discharge status: Home / Self Care | Payer: Medicare Other | Source: Ambulatory Visit | Attending: Vascular Surgery | Admitting: Vascular Surgery

## 2013-10-06 ENCOUNTER — Ambulatory Visit (INDEPENDENT_AMBULATORY_CARE_PROVIDER_SITE_OTHER): Payer: Self-pay | Admitting: Vascular Surgery

## 2013-10-06 ENCOUNTER — Other Ambulatory Visit: Payer: Self-pay | Admitting: Vascular Surgery

## 2013-10-06 DIAGNOSIS — I658 Occlusion and stenosis of other precerebral arteries: Secondary | ICD-10-CM | POA: Insufficient documentation

## 2013-10-06 DIAGNOSIS — Z48812 Encounter for surgical aftercare following surgery on the circulatory system: Secondary | ICD-10-CM | POA: Insufficient documentation

## 2013-10-06 DIAGNOSIS — I6529 Occlusion and stenosis of unspecified carotid artery: Secondary | ICD-10-CM | POA: Insufficient documentation

## 2013-10-06 MED ORDER — CLOPIDOGREL BISULFATE 75 MG PO TABS: 75.0000 mg | ORAL_TABLET | Freq: Every day | ORAL | Status: DC

## 2013-10-06 NOTE — Addendum Note (Signed)
Addended by: Sharee Pimple on: 10/06/2013 03:22 PM   Modules accepted: Orders

## 2013-10-06 NOTE — Progress Notes (Signed)
Patient is a 67 year old male who returns today for followup after left carotid stenting October 31. He has also had previous right carotid endarterectomy in the remote past. He denies any symptoms of TIA amaurosis or stroke. He is on aspirin and Plavix.  Review of systems: He denies shortness of breath. He denies chest pain. He states he previously had right leg claudication but this is improved.  Physical exam:  Filed Vitals:   10/06/13 1410 10/06/13 1412  BP: 153/73 151/75  Pulse: 62   Height: 5\' 10"  (1.778 m)   Weight: 180 lb (81.647 kg)   SpO2: 98%     Neck: No carotid bruits well-healed bilateral carotid neck incisions Neuro: Symmetric upper and lower extremity motor strength 5 over 5  Chest: Clear to auscultation bilaterally,  Cardiac: Regular rate no murmur  Abdomen: Soft nontender no mass  Extremities: 2+ femoral pulses absent popliteal and pedal pulses bilaterally  Skin: No ulcerations on the feet  Data: The patient had a duplex ultrasound of his left carotid today. This showed a left external carotid stenosis. There was a possible 40-60% stenosis distal to the left carotid stent. Velocities were 203 cm/s.  Assessment: Doing well status post left carotid stent, has known 40-60% stenosis in the previously endarterectomized right side asymptomatic  Plan: Continue Plavix and aspirin followup carotid duplex scan 6 months.  Patient was given a renewal for his Plavix prescription today to  Fabienne Bruns, MD [facility] [place] Office: [phone] Pager: [phone]

## 2013-12-06 ENCOUNTER — Other Ambulatory Visit: Payer: Self-pay | Admitting: *Deleted

## 2013-12-06 DIAGNOSIS — L98499 Non-pressure chronic ulcer of skin of other sites with unspecified severity: Principal | ICD-10-CM

## 2013-12-06 DIAGNOSIS — I739 Peripheral vascular disease, unspecified: Secondary | ICD-10-CM

## 2013-12-07 ENCOUNTER — Encounter: Payer: Self-pay | Admitting: Vascular Surgery

## 2013-12-08 ENCOUNTER — Ambulatory Visit (HOSPITAL_COMMUNITY)
Admission: RE | Admit: 2013-12-08 | Discharge: 2013-12-08 | Disposition: A | Discharge status: Home / Self Care | Payer: Medicare Other | Source: Ambulatory Visit | Attending: Vascular Surgery | Admitting: Vascular Surgery

## 2013-12-08 ENCOUNTER — Encounter: Payer: Self-pay | Admitting: Vascular Surgery

## 2013-12-08 ENCOUNTER — Ambulatory Visit (INDEPENDENT_AMBULATORY_CARE_PROVIDER_SITE_OTHER): Payer: Medicare Other | Admitting: Vascular Surgery

## 2013-12-08 VITALS — BP 128/69 | HR 67 | Temp 99.0°F | Ht 70.0 in | Wt 181.0 lb

## 2013-12-08 DIAGNOSIS — I739 Peripheral vascular disease, unspecified: Secondary | ICD-10-CM | POA: Insufficient documentation

## 2013-12-08 DIAGNOSIS — L98499 Non-pressure chronic ulcer of skin of other sites with unspecified severity: Secondary | ICD-10-CM | POA: Insufficient documentation

## 2013-12-08 DIAGNOSIS — I7025 Atherosclerosis of native arteries of other extremities with ulceration: Secondary | ICD-10-CM | POA: Insufficient documentation

## 2013-12-08 DIAGNOSIS — L97509 Non-pressure chronic ulcer of other part of unspecified foot with unspecified severity: Secondary | ICD-10-CM | POA: Insufficient documentation

## 2013-12-08 NOTE — Progress Notes (Signed)
Patient is a 68 year old male who presents today for nonhealing ulceration on the dorsum of his right third toe. The ulcer has been present for several weeks.  It started as a laceration when the patient was trimming his nails. He denies fever or chills or drainage from the ulceration. He was on antibiotics previously but these have now ended. He is a former smoker but has not smoked recently. He is on Plavix and aspirin. He has previously had left carotid stenting and right carotid endarterectomy.  Past Medical History  Diagnosis Date  . Hyperlipidemia   . Hypertension   . IHD (ischemic heart disease)     Prior CABG in 2003  . History of kidney stones   . Torn rotator cuff   . Normal nuclear stress test June 2012  . CAD (coronary artery disease)   . Dysrhythmia     skips beats  . Diabetes mellitus     fasting 100-120s  . Peripheral vascular disease   . GERD (gastroesophageal reflux disease)   . Dizziness   . Arthritis   . Carotid artery occlusion    History   Social History  . Marital Status: Married    Spouse Name: N/A    Number of Children: N/A  . Years of Education: N/A   Occupational History  . Not on file.   Social History Main Topics  . Smoking status: Former Smoker    Quit date: 10/27/2001  . Smokeless tobacco: Not on file  . Alcohol Use: No  . Drug Use: No  . Sexual Activity: Not on file   Other Topics Concern  . Not on file   Social History Narrative  . No narrative on file   Current Outpatient Prescriptions on File Prior to Visit  Medication Sig Dispense Refill  . amLODipine-benazepril (LOTREL) 5-20 MG per capsule Take 1 capsule by mouth daily.       Marland Kitchen aspirin EC 81 MG tablet Take 81 mg by mouth daily.      . clopidogrel (PLAVIX) 75 MG tablet Take 1 tablet (75 mg total) by mouth daily.  90 tablet  6  . hydrochlorothiazide (HYDRODIURIL) 12.5 MG tablet Take 12.5 mg by mouth daily.       . Insulin Glargine (LANTUS SOLOSTAR) 100 UNIT/ML SOPN Inject 44 Units  into the skin at bedtime.      . insulin lispro (HUMALOG PEN) 100 UNIT/ML SOPN Inject 10-15 Units into the skin See admin instructions. Takes 10 units at breakfast and lunch. Take 15 units at dinner; If blood sugar 150-200 take an additional 1 unit, if blood sugar 201-250 take an additional 2 units; If blood sugar 251-300 take an additional 3 units, if blood sugar >300 take an additional 4 units      . metFORMIN (GLUCOPHAGE-XR) 500 MG 24 hr tablet Take 500 mg by mouth 2 (two) times daily with a meal.       . metoprolol (LOPRESSOR) 50 MG tablet Take 50 mg by mouth 2 (two) times daily.      . nitroGLYCERIN (NITROSTAT) 0.4 MG SL tablet Place 0.4 mg under the tongue every 5 (five) minutes as needed for chest pain.      Marland Kitchen omeprazole (PRILOSEC) 20 MG capsule Take 20 mg by mouth daily.      . rosuvastatin (CRESTOR) 20 MG tablet Take 20 mg by mouth at bedtime.       No current facility-administered medications on file prior to visit.    Physical exam:  Filed Vitals:   12/08/13 1434  BP: 128/69  Pulse: 67  Temp: 99 F (37.2 C)  TempSrc: Oral  Height: 5' 10" (1.778 m)  Weight: 181 lb (82.101 kg)  SpO2: 96%     Extremities: 2+ femoral pulses bilaterally absent popliteal and pedal pulses bilaterally  Skin: Dorsum right third toe 3 mm ulcerated peronychia no significant erythema or drainage   Data: Arterial duplex exam was performed today. I reviewed and interpreted this study. He has increased velocities in the mid right superficial femoral artery 503 cm/s with three-vessel runoff he also has an elevated velocity in the left superficial for artery 302 cm/s with three-vessel runoff  Assessment:  Nonhealing wound right lower extremity with known right superficial femoral artery stenosis  Plan: Aortogram bilateral lower extremity runoff possible intervention March 25. Procedure details were discussed the patient and his wife today. He understands and agrees to proceed.  We will continue his  Plavix and aspirin.  Charles Fields, MD [facility] [place] Office: [phone] Pager: [phone]  

## 2013-12-16 ENCOUNTER — Other Ambulatory Visit: Payer: Self-pay

## 2013-12-16 ENCOUNTER — Encounter (HOSPITAL_COMMUNITY): Payer: Self-pay | Admitting: Pharmacy Technician

## 2013-12-20 MED ORDER — SODIUM CHLORIDE 0.9 % IV SOLN
INTRAVENOUS | Status: DC
  Administered 2013-12-21: 09:00:00 via INTRAVENOUS

## 2013-12-21 ENCOUNTER — Encounter (HOSPITAL_COMMUNITY)
Admission: RE | Disposition: A | Discharge status: Home / Self Care | Payer: Self-pay | Source: Ambulatory Visit | Attending: Vascular Surgery

## 2013-12-21 ENCOUNTER — Ambulatory Visit (HOSPITAL_COMMUNITY)
Admission: RE | Admit: 2013-12-21 | Discharge: 2013-12-21 | Disposition: A | Discharge status: Home / Self Care | Payer: Medicare Other | Source: Ambulatory Visit | Attending: Vascular Surgery | Admitting: Vascular Surgery

## 2013-12-21 ENCOUNTER — Telehealth: Payer: Self-pay | Admitting: Vascular Surgery

## 2013-12-21 DIAGNOSIS — L98499 Non-pressure chronic ulcer of skin of other sites with unspecified severity: Secondary | ICD-10-CM | POA: Insufficient documentation

## 2013-12-21 DIAGNOSIS — I739 Peripheral vascular disease, unspecified: Secondary | ICD-10-CM | POA: Insufficient documentation

## 2013-12-21 HISTORY — PX: ABDOMINAL AORTAGRAM: SHX5454

## 2013-12-21 LAB — POCT I-STAT, CHEM 8
BUN: 23 mg/dL (ref 6–23)
Calcium, Ion: 1.25 mmol/L (ref 1.13–1.30)
Chloride: 101 mEq/L (ref 96–112)
Creatinine, Ser: 1.1 mg/dL (ref 0.50–1.35)
Glucose, Bld: 234 mg/dL — ABNORMAL HIGH (ref 70–99)
HEMATOCRIT: 52 % (ref 39.0–52.0)
HEMOGLOBIN: 17.7 g/dL — AB (ref 13.0–17.0)
Potassium: 4 mEq/L (ref 3.7–5.3)
Sodium: 141 mEq/L (ref 137–147)
TCO2: 26 mmol/L (ref 0–100)

## 2013-12-21 LAB — GLUCOSE, CAPILLARY
Glucose-Capillary: 185 mg/dL — ABNORMAL HIGH (ref 70–99)
Glucose-Capillary: 182 mg/dL — ABNORMAL HIGH (ref 70–99)

## 2013-12-21 LAB — POCT ACTIVATED CLOTTING TIME
ACTIVATED CLOTTING TIME: 304 s
ACTIVATED CLOTTING TIME: 227 s
ACTIVATED CLOTTING TIME: 188 s
Activated Clotting Time: 155 seconds

## 2013-12-21 SURGERY — ABDOMINAL AORTAGRAM
Anesthesia: LOCAL | Laterality: Right

## 2013-12-21 MED ORDER — LIDOCAINE HCL (PF) 1 % IJ SOLN
INTRAMUSCULAR | Status: AC
  Filled 2013-12-21: qty 30

## 2013-12-21 MED ORDER — ACETAMINOPHEN 325 MG PO TABS
325.0000 mg | ORAL_TABLET | ORAL | Status: DC | PRN
  Administered 2013-12-21: 325 mg via ORAL
  Filled 2013-12-21: qty 2

## 2013-12-21 MED ORDER — FENTANYL CITRATE 0.05 MG/ML IJ SOLN
INTRAMUSCULAR | Status: AC
  Filled 2013-12-21: qty 2

## 2013-12-21 MED ORDER — MIDAZOLAM HCL 2 MG/2ML IJ SOLN
INTRAMUSCULAR | Status: AC
  Filled 2013-12-21: qty 2

## 2013-12-21 MED ORDER — ACETAMINOPHEN 325 MG RE SUPP
325.0000 mg | RECTAL | Status: DC | PRN
  Filled 2013-12-21: qty 2

## 2013-12-21 MED ORDER — HEPARIN SODIUM (PORCINE) 1000 UNIT/ML IJ SOLN
INTRAMUSCULAR | Status: AC
  Filled 2013-12-21: qty 1

## 2013-12-21 MED ORDER — ACETAMINOPHEN 325 MG PO TABS
ORAL_TABLET | ORAL | Status: AC
  Filled 2013-12-21: qty 2

## 2013-12-21 MED ORDER — SODIUM CHLORIDE 0.45 % IV SOLN
INTRAVENOUS | Status: DC
  Administered 2013-12-21: 11:00:00 via INTRAVENOUS

## 2013-12-21 MED ORDER — HEPARIN (PORCINE) IN NACL 2-0.9 UNIT/ML-% IJ SOLN
INTRAMUSCULAR | Status: AC
  Filled 2013-12-21: qty 1000

## 2013-12-21 NOTE — Progress Notes (Signed)
Received from cath lab via stretcher.  Left groin site CDI with occlusive dressing.  Post procedure instructions reviewed w/ pt.  Po's offered, call bell in reach.  Will monitor.

## 2013-12-21 NOTE — Progress Notes (Signed)
Discharge instruction given per MD order.  Pt and CG able to verbalize understanding.  Pt to car via wheelchair. 

## 2013-12-21 NOTE — Telephone Encounter (Addendum)
Message copied by Doristine Section on Wed Dec 21, 2013  3:17 PM ------      Message from: [place]: Wed Dec 21, 2013 11:14 AM       Aortogram with bilat runoff      3rd order cath right SFA      Right SFA stent            He needs a follow up duplex of leg, ABI and see me in 2 weeks            Juanda Crumble ------  notified patient of fu appt. on 01-12-14 9 am with dr. Oneida Alar

## 2013-12-21 NOTE — Discharge Instructions (Signed)
DO NOT TAKE YOUR METFORMIN UNTIL SATURDAY.  IT CAN INTERACT WITH THE DYE YOU RECEIVED TODAY.   Angiography, Care After Refer to this sheet in the next few weeks. These instructions provide you with information on caring for yourself after your procedure. Your health care provider may also give you more specific instructions. Your treatment has been planned according to current medical practices, but problems sometimes occur. Call your health care provider if you have any problems or questions after your procedure.  WHAT TO EXPECT AFTER THE PROCEDURE After your procedure, it is typical to have the following sensations:  Minor discomfort or tenderness and a small bump at the catheter insertion site. The bump should usually decrease in size and tenderness within 1 to 2 weeks.  Any bruising will usually fade within 2 to 4 weeks. HOME CARE INSTRUCTIONS   You may need to keep taking blood thinners if they were prescribed for you. Only take over-the-counter or prescription medicines for pain, fever, or discomfort as directed by your health care provider.  Do not apply powder or lotion to the site.  Do not sit in a bathtub, swimming pool, or whirlpool for 5 to 7 days.  You may shower 24 hours after the procedure. Remove the bandage (dressing) and gently wash the site with plain soap and water. Gently pat the site dry.  Inspect the site at least twice daily.  Limit your activity for the first 48 hours. Do not bend, squat, or lift anything over 20 lb (9 kg) or as directed by your health care provider.  Do not drive home if you are discharged the day of the procedure. Have someone else drive you. Follow instructions about when you can drive or return to work. SEEK MEDICAL CARE IF:  You get lightheaded when standing up.  You have drainage (other than a small amount of blood on the dressing).  You have chills.  You have a fever.  You have redness, warmth, swelling, or pain at the insertion  site. SEEK IMMEDIATE MEDICAL CARE IF:   You develop chest pain or shortness of breath, feel faint, or pass out.  You have bleeding, swelling larger than a walnut, or drainage from the catheter insertion site.  You develop pain, discoloration, coldness, or severe bruising in the leg or arm that held the catheter.  You develop bleeding from any other place, such as the bowels. You may see bright red blood in your urine or stools, or your stools may appear black and tarry.  You have heavy bleeding from the site. If this happens, hold pressure on the site. MAKE SURE YOU:  Understand these instructions.  Will watch your condition.  Will get help right away if you are not doing well or get worse. Document Released: 05/01/2005 Document Revised: 06/15/2013 Document Reviewed: 03/07/2013 [facility] Patient Information 2014 [place].

## 2013-12-21 NOTE — H&P (View-Only) (Signed)
Patient is a 68 year old male who presents today for nonhealing ulceration on the dorsum of his right third toe. The ulcer has been present for several weeks.  It started as a laceration when the patient was trimming his nails. He denies fever or chills or drainage from the ulceration. He was on antibiotics previously but these have now ended. He is a former smoker but has not smoked recently. He is on Plavix and aspirin. He has previously had left carotid stenting and right carotid endarterectomy.  Past Medical History  Diagnosis Date  . Hyperlipidemia   . Hypertension   . IHD (ischemic heart disease)     Prior CABG in 2003  . History of kidney stones   . Torn rotator cuff   . Normal nuclear stress test June 2012  . CAD (coronary artery disease)   . Dysrhythmia     skips beats  . Diabetes mellitus     fasting 100-120s  . Peripheral vascular disease   . GERD (gastroesophageal reflux disease)   . Dizziness   . Arthritis   . Carotid artery occlusion    History   Social History  . Marital Status: Married    Spouse Name: N/A    Number of Children: N/A  . Years of Education: N/A   Occupational History  . Not on file.   Social History Main Topics  . Smoking status: Former Smoker    Quit date: 10/27/2001  . Smokeless tobacco: Not on file  . Alcohol Use: No  . Drug Use: No  . Sexual Activity: Not on file   Other Topics Concern  . Not on file   Social History Narrative  . No narrative on file   Current Outpatient Prescriptions on File Prior to Visit  Medication Sig Dispense Refill  . amLODipine-benazepril (LOTREL) 5-20 MG per capsule Take 1 capsule by mouth daily.       Marland Kitchen aspirin EC 81 MG tablet Take 81 mg by mouth daily.      . clopidogrel (PLAVIX) 75 MG tablet Take 1 tablet (75 mg total) by mouth daily.  90 tablet  6  . hydrochlorothiazide (HYDRODIURIL) 12.5 MG tablet Take 12.5 mg by mouth daily.       . Insulin Glargine (LANTUS SOLOSTAR) 100 UNIT/ML SOPN Inject 44 Units  into the skin at bedtime.      . insulin lispro (HUMALOG PEN) 100 UNIT/ML SOPN Inject 10-15 Units into the skin See admin instructions. Takes 10 units at breakfast and lunch. Take 15 units at dinner; If blood sugar 150-200 take an additional 1 unit, if blood sugar 201-250 take an additional 2 units; If blood sugar 251-300 take an additional 3 units, if blood sugar >300 take an additional 4 units      . metFORMIN (GLUCOPHAGE-XR) 500 MG 24 hr tablet Take 500 mg by mouth 2 (two) times daily with a meal.       . metoprolol (LOPRESSOR) 50 MG tablet Take 50 mg by mouth 2 (two) times daily.      . nitroGLYCERIN (NITROSTAT) 0.4 MG SL tablet Place 0.4 mg under the tongue every 5 (five) minutes as needed for chest pain.      Marland Kitchen omeprazole (PRILOSEC) 20 MG capsule Take 20 mg by mouth daily.      . rosuvastatin (CRESTOR) 20 MG tablet Take 20 mg by mouth at bedtime.       No current facility-administered medications on file prior to visit.    Physical exam:  Filed Vitals:   12/08/13 1434  BP: 128/69  Pulse: 67  Temp: 99 F (37.2 C)  TempSrc: Oral  Height: 5\' 10"  (1.778 m)  Weight: 181 lb (82.101 kg)  SpO2: 96%     Extremities: 2+ femoral pulses bilaterally absent popliteal and pedal pulses bilaterally  Skin: Dorsum right third toe 3 mm ulcerated peronychia no significant erythema or drainage   Data: Arterial duplex exam was performed today. I reviewed and interpreted this study. He has increased velocities in the mid right superficial femoral artery 503 cm/s with three-vessel runoff he also has an elevated velocity in the left superficial for artery 302 cm/s with three-vessel runoff  Assessment:  Nonhealing wound right lower extremity with known right superficial femoral artery stenosis  Plan: Aortogram bilateral lower extremity runoff possible intervention March 25. Procedure details were discussed the patient and his wife today. He understands and agrees to proceed.  We will continue his  Plavix and aspirin.  Ruta Hinds, MD [facility] Office: [phone] Pager: [phone]

## 2013-12-21 NOTE — Op Note (Signed)
Procedure: Aortogram with right lower extremity runoff, right SFA stent Preoperative diagnosis: Non healing wound right foot with critical ischemia  Postoperative diagnosis: Same  Anesthesia Local with sedation  Operative details: After obtaining informed consent, the patient was taken to the [facility]. The patient was placed in supine position on the Angio table. Both groins were prepped and draped in usual sterile fashion. Local anesthesia was infiltrated over the left common femoral artery. Ultrasound was used to identify the left common femoral artery. An introducer needle was used to cannulate the left common femoral artery and 035 versacore wire threaded into the abdominal aorta under fluoroscopic guidance. Next a 5 French sheath is placed over the guidewire in the left common femoral artery. A 5 French pigtail catheter was placed over the guidewire into the abdominal aorta and abdominal aortogram was obtained. The infrarenal abdominal aorta is patent. The left and right common internal and external iliac arteries are patent. The left and right renal arteries are patent. At this point oblique views of the pelvis were obtained. These were done in the 30 RAO and LAO projection. The right and left common external and internal iliac arteries are patent. There is some atherosclerotic change but no flow limiting lesion. Next lower extremity runoff views were obtained    In the right lower extremity, the right common femoral and profunda femoris artery is patent. The right superficial femoral artery is patent. However in the mid to distal segment there are 2 high-grade stenoses one greater than 90% 1 approximate 70%. There is diffuse atherosclerotic change of the entire superficial femoral artery. The popliteal artery is patent. The anterior tibial artery is occluded. There is three-vessel runoff via the peroneal and posterior tibial artery.    In the left lower extremity, the left common femoral artery is  patent. The superficial femoral artery is diffusely atherosclerotic but no flow-limiting lesion. The popliteal artery is patent. The antecubital artery is occluded. There is three-vessel runoff via the posterior tibial and peroneal arteries.   A 5 Fr crossover catheter was brought up on the field.  The crossover catheter was used to selectively catheterize the right common iliac artery and the guidewire advanced into the right distal external iliac artery. The crossover catheter and the 5 French sheath were removed and replaced with a 7 [place] Pinnacle sheath. Once the Pinnacle sheath was in the distal right external iliac artery the guidewire was advanced down into the right superficial femoral artery. The sheath and dilator were then advanced over this to create a stable platform in the right superficial femoral artery. Patient was given 7000 units of heparin. ACT was confirmed to be above 300. Next the versacore wire was used to cross the stenosis in the right superficial femoral artery. This was done without difficulty. Selective contrast angiogram was obtained to determine the precise level of the lesion. A 6 x 100 self-expanding EV3 was selected.  This was advanced to the level lesion and deployed. This was then postdilated with a 6 x 100 balloon. This was done to 6 atmospheres for 40 seconds.  Complete Angiogram was then performed the right lower extremity. This showed a widely patent right superficial femoral artery stent with no extravasation or dissection. The two-vessel runoff was intact. The dilator was placed back through the sheath and this was pulled back over the guidewire into the left pelvis. The guidewire and dilator removed. The 7 Fr sheath was left in place to be pulled in the holding area. The patient  tolerated the procedure well and there were no complications. Patient was taken to the holding area in stable condition.  Operative findings: Right leg- SFA occlusion successfully stented  with 6 x 100 daily 3 stent Left leg patent lower extremity arterial tree with occlusion of left anterior tibial artery but patent two-vessel runoff via the peroneal and posterior tibial arteries    Ruta Hinds, MD [facility]: [phone] Pager: [phone]

## 2013-12-21 NOTE — Interval H&P Note (Signed)
History and Physical Interval Note:  12/21/2013 9:48 AM  [name]  has presented today for surgery, with the diagnosis of pvd with ulcer  The various methods of treatment have been discussed with the patient and family. After consideration of risks, benefits and other options for treatment, the patient has consented to  Procedure(s): ABDOMINAL AORTAGRAM (N/A) as a surgical intervention .  The patient's history has been reviewed, patient examined, no change in status, stable for surgery.  I have reviewed the patient's chart and labs.  Questions were answered to the patient's satisfaction.     [name] E

## 2013-12-26 ENCOUNTER — Other Ambulatory Visit: Payer: Self-pay | Admitting: *Deleted

## 2013-12-26 DIAGNOSIS — Z48812 Encounter for surgical aftercare following surgery on the circulatory system: Secondary | ICD-10-CM

## 2013-12-26 DIAGNOSIS — I739 Peripheral vascular disease, unspecified: Secondary | ICD-10-CM

## 2014-01-11 ENCOUNTER — Encounter: Payer: Self-pay | Admitting: Vascular Surgery

## 2014-01-12 ENCOUNTER — Ambulatory Visit (INDEPENDENT_AMBULATORY_CARE_PROVIDER_SITE_OTHER): Payer: Medicare Other | Admitting: Vascular Surgery

## 2014-01-12 ENCOUNTER — Ambulatory Visit (HOSPITAL_COMMUNITY)
Admission: RE | Admit: 2014-01-12 | Discharge: 2014-01-12 | Disposition: A | Discharge status: Home / Self Care | Payer: Medicare Other | Source: Ambulatory Visit | Attending: Vascular Surgery | Admitting: Vascular Surgery

## 2014-01-12 ENCOUNTER — Ambulatory Visit (HOSPITAL_COMMUNITY)
Admit: 2014-01-12 | Discharge: 2014-01-12 | Disposition: A | Discharge status: Home / Self Care | Payer: Medicare Other | Attending: Vascular Surgery | Admitting: Vascular Surgery

## 2014-01-12 ENCOUNTER — Encounter: Payer: Self-pay | Admitting: Vascular Surgery

## 2014-01-12 ENCOUNTER — Encounter (HOSPITAL_COMMUNITY): Payer: Medicare Other

## 2014-01-12 VITALS — BP 136/73 | HR 73 | Ht 70.0 in | Wt 179.0 lb

## 2014-01-12 DIAGNOSIS — I739 Peripheral vascular disease, unspecified: Secondary | ICD-10-CM

## 2014-01-12 DIAGNOSIS — Z48812 Encounter for surgical aftercare following surgery on the circulatory system: Secondary | ICD-10-CM

## 2014-01-12 NOTE — Progress Notes (Signed)
Patient returns for followup today. He underwent right superficial femoral artery stenting in February 25. This was done for an ulceration on the right third toe. The ulcers now healed. The patient's claudication symptoms in the right leg have completely resolved. Of note he previously underwent right carotid stenting 08/26/2013. He denies any neurologic symptoms. He is trying to refrain from smoking. He is on Plavix and aspirin.  Past Medical History  Diagnosis Date  . Hyperlipidemia   . Hypertension   . IHD (ischemic heart disease)     Prior CABG in 2003  . History of kidney stones   . Torn rotator cuff   . Normal nuclear stress test June 2012  . CAD (coronary artery disease)   . Dysrhythmia     skips beats  . Diabetes mellitus     fasting 100-120s  . Peripheral vascular disease   . GERD (gastroesophageal reflux disease)   . Dizziness   . Arthritis   . Carotid artery occlusion    History   Social History  . Marital Status: Married    Spouse Name: N/A    Number of Children: N/A  . Years of Education: N/A   Occupational History  . Not on file.   Social History Main Topics  . Smoking status: Former Smoker    Quit date: 10/27/2001  . Smokeless tobacco: Not on file  . Alcohol Use: No  . Drug Use: No  . Sexual Activity: Not on file   Other Topics Concern  . Not on file   Social History Narrative  . No narrative on file    Physical exam:  Filed Vitals:   01/12/14 0943  BP: 136/73  Pulse: 73  Height: 5\' 10"  (1.778 m)  Weight: 179 lb (81.194 kg)  SpO2: 98%    Extremities: 2+ femoral pulses bilaterally absent popliteal and pedal pulses  Skin: No open ulcerations  Data: Duplex of the right superficial femoral artery stent shows wide patency. I reviewed and interpreted this study.  Assessment:  Patent right superficial femoral artery stent with healed wound right foot  Plan: Continue Plavix and aspirin indefinitely. Followup carotid duplex and right lower  extremity duplex scan in 3 months  Ruta Hinds, MD [facility]: [phone] Pager: [phone]

## 2014-01-17 ENCOUNTER — Other Ambulatory Visit: Payer: Self-pay | Admitting: *Deleted

## 2014-01-17 DIAGNOSIS — I739 Peripheral vascular disease, unspecified: Secondary | ICD-10-CM

## 2014-01-17 DIAGNOSIS — Z48812 Encounter for surgical aftercare following surgery on the circulatory system: Secondary | ICD-10-CM

## 2014-04-05 ENCOUNTER — Encounter: Payer: Self-pay | Admitting: Vascular Surgery

## 2014-04-06 ENCOUNTER — Other Ambulatory Visit (HOSPITAL_COMMUNITY): Payer: Medicare Other

## 2014-04-06 ENCOUNTER — Ambulatory Visit: Payer: Medicare Other | Admitting: Vascular Surgery

## 2014-04-06 ENCOUNTER — Ambulatory Visit (INDEPENDENT_AMBULATORY_CARE_PROVIDER_SITE_OTHER)
Admission: RE | Admit: 2014-04-06 | Discharge: 2014-04-06 | Disposition: A | Discharge status: Home / Self Care | Payer: Medicare Other | Source: Ambulatory Visit | Attending: Vascular Surgery | Admitting: Vascular Surgery

## 2014-04-06 ENCOUNTER — Other Ambulatory Visit: Payer: Self-pay | Admitting: Vascular Surgery

## 2014-04-06 ENCOUNTER — Ambulatory Visit (INDEPENDENT_AMBULATORY_CARE_PROVIDER_SITE_OTHER): Payer: Medicare Other | Admitting: Vascular Surgery

## 2014-04-06 ENCOUNTER — Encounter: Payer: Self-pay | Admitting: Vascular Surgery

## 2014-04-06 ENCOUNTER — Ambulatory Visit (HOSPITAL_COMMUNITY)
Admission: RE | Admit: 2014-04-06 | Discharge: 2014-04-06 | Disposition: A | Discharge status: Home / Self Care | Payer: Medicare Other | Source: Ambulatory Visit | Attending: Vascular Surgery | Admitting: Vascular Surgery

## 2014-04-06 VITALS — BP 142/65 | HR 63 | Resp 18 | Ht 70.0 in | Wt 184.9 lb

## 2014-04-06 DIAGNOSIS — I739 Peripheral vascular disease, unspecified: Secondary | ICD-10-CM

## 2014-04-06 DIAGNOSIS — I658 Occlusion and stenosis of other precerebral arteries: Secondary | ICD-10-CM | POA: Insufficient documentation

## 2014-04-06 DIAGNOSIS — Z48812 Encounter for surgical aftercare following surgery on the circulatory system: Secondary | ICD-10-CM

## 2014-04-06 DIAGNOSIS — I6529 Occlusion and stenosis of unspecified carotid artery: Secondary | ICD-10-CM | POA: Insufficient documentation

## 2014-04-06 NOTE — Addendum Note (Signed)
Addended by: Mena Goes on: 04/06/2014 04:35 PM   Modules accepted: Orders

## 2014-04-06 NOTE — Progress Notes (Signed)
Patient is a 68 year old male who returns for followup today. He previously underwent right carotid endarterectomy approximately 10 years ago. He also had a right superficial femoral artery stent in February of 2015. He had a left carotid stent placed in October of 2014. He is on aspirin and Plavix. He denies any claudication symptoms currently. He is reported no symptoms of TIA amaurosis or stroke. Overall he feels well.   Review of systems: He has no history of chest pain or shortness of breath.  Physical exam:  Filed Vitals:   04/06/14 1227  BP: 142/65  Pulse: 63  Resp: 18  Height: 5\' 10"  (1.778 m)  Weight: 184 lb 14.4 oz (83.87 kg)    Neck: Bilateral carotid bruits right greater than left Chest clear to auscultation bilaterally  Cardiac: Regular rate and rhythm without murmur  Extremities: 2+ femoral pulses bilaterally 1+ popliteal pulses absent pedal pulses bilaterally  Data: Patient had a carotid duplex scan today which shows some recurrence on the left side velocities were 313/97 right side less than 40% stenosis. ABIs today were 0.8, left 0.97 on the right 0.97 was increased from 0.33 prior to SFA stent. He did have some increased velocities in the proximal SFA stent a 327 cm/s.  Assessment: #1 patent right carotid endarterectomy #2 moderate recurrent stenosis left internal carotid artery stent #3 moderate recurrent stenosis right superficial femoral artery stent  Plan: The patient will continue his Plavix and aspirin. He has refrained from smoking. He will need close followup. He'll return for repeat duplex exam of the lower extremities and carotid in 6 months. He'll return sooner if he develops claudication symptoms or any symptoms of TIA or stroke.  Ruta Hinds, MD [facility] Office: [phone] Pager: [phone]

## 2014-05-19 ENCOUNTER — Encounter: Payer: Self-pay | Admitting: Cardiology

## 2014-05-19 ENCOUNTER — Ambulatory Visit (INDEPENDENT_AMBULATORY_CARE_PROVIDER_SITE_OTHER): Payer: Medicare Other | Admitting: Cardiology

## 2014-05-19 VITALS — BP 156/80 | HR 76 | Ht 70.0 in | Wt 190.0 lb

## 2014-05-19 DIAGNOSIS — I119 Hypertensive heart disease without heart failure: Secondary | ICD-10-CM

## 2014-05-19 DIAGNOSIS — I739 Peripheral vascular disease, unspecified: Secondary | ICD-10-CM

## 2014-05-19 DIAGNOSIS — E1165 Type 2 diabetes mellitus with hyperglycemia: Secondary | ICD-10-CM

## 2014-05-19 DIAGNOSIS — E785 Hyperlipidemia, unspecified: Secondary | ICD-10-CM

## 2014-05-19 DIAGNOSIS — IMO0001 Reserved for inherently not codable concepts without codable children: Secondary | ICD-10-CM

## 2014-05-19 DIAGNOSIS — I259 Chronic ischemic heart disease, unspecified: Secondary | ICD-10-CM | POA: Insufficient documentation

## 2014-05-19 DIAGNOSIS — I10 Essential (primary) hypertension: Secondary | ICD-10-CM

## 2014-05-19 LAB — LIPID PANEL
CHOLESTEROL: 121 mg/dL (ref 0–200)
HDL: 32.5 mg/dL — ABNORMAL LOW (ref >39.00)
LDL Cholesterol: 65 mg/dL (ref 0–99)
NONHDL: 88.5
Total CHOL/HDL Ratio: 4
Triglycerides: 118 mg/dL (ref 0.0–149.0)
VLDL: 23.6 mg/dL (ref 0.0–40.0)

## 2014-05-19 LAB — BASIC METABOLIC PANEL
BUN: 16 mg/dL (ref 6–23)
CALCIUM: 9.4 mg/dL (ref 8.4–10.5)
CO2: 27 mEq/L (ref 19–32)
CREATININE: 0.9 mg/dL (ref 0.4–1.5)
Chloride: 104 mEq/L (ref 96–112)
GFR: 91.89 mL/min (ref >60.00)
GLUCOSE: 156 mg/dL — AB (ref 70–99)
Potassium: 3.8 mEq/L (ref 3.5–5.1)
Sodium: 136 mEq/L (ref 135–145)

## 2014-05-19 LAB — HEPATIC FUNCTION PANEL
ALK PHOS: 73 U/L (ref 39–117)
ALT: 22 U/L (ref 0–53)
AST: 22 U/L (ref 0–37)
Albumin: 3.9 g/dL (ref 3.5–5.2)
BILIRUBIN TOTAL: 0.7 mg/dL (ref 0.2–1.2)
Bilirubin, Direct: 0.1 mg/dL (ref 0.0–0.3)
Total Protein: 7.3 g/dL (ref 6.0–8.3)

## 2014-05-19 NOTE — Assessment & Plan Note (Signed)
The patient's blood pressure at home has been normal.  The blood pressure today was elevated.  He has not taken any of his blood pressure medicines yet today.  Last week and his endocrinologist office his pressure was normal.  He will continue current therapy.  His weight is up 6 pounds since last visit he will work harder on weight loss.

## 2014-05-19 NOTE — Progress Notes (Signed)
Cherlynn June Date of Birth:  May 12, 1946 [address] [place], [place]  [address] [phone]        Fax   [phone]   History of Present Illness: this pleasant 68 year old gentleman is seen for a six-month followup office visit. He has a past history of coronary artery disease and had coronary artery bypass graft surgery in 2003. He has a remote history of a right carotid endarterectomy about 18 years ago.  He had a left carotid endarterectomy by Dr. Oneida Alar in 2014. He has a history of high blood pressure, diabetes mellitus, and hyperlipidemia. He had a normal nuclear stress test in June 2012 prior to rotator cuff surgery.  The patient developed claudication of his right leg last year and underwent successful vascular surgery in February 2015.   Since last visit he has been doing well with no chest pain or shortness of breath. Since last visit he has retired and has been enjoying retirement.  He is no longer experiencing any right calf claudication.  He has diabetes followed by Dr. Tamala Julian at [facility].  His most recent A1c was 8.1 last week.  Current Outpatient Prescriptions  Medication Sig Dispense Refill  . amLODipine-benazepril (LOTREL) 5-20 MG per capsule Take 1 capsule by mouth daily.       Marland Kitchen aspirin EC 81 MG tablet Take 81 mg by mouth daily.      . clopidogrel (PLAVIX) 75 MG tablet Take 1 tablet (75 mg total) by mouth daily.  90 tablet  6  . hydrochlorothiazide (HYDRODIURIL) 12.5 MG tablet Take 12.5 mg by mouth daily.       . Insulin Glargine (LANTUS SOLOSTAR) 100 UNIT/ML SOPN Inject 44 Units into the skin at bedtime.      . insulin lispro (HUMALOG PEN) 100 UNIT/ML SOPN Inject 10-15 Units into the skin See admin instructions. Takes 10 units at breakfast and lunch. Take 15 units at dinner; If blood sugar 150-200 take an additional 1 unit, if blood sugar 201-250 take an additional 2 units; If blood sugar 251-300 take an additional 3 units, if blood  sugar >300 take an additional 4 units      . metFORMIN (GLUCOPHAGE-XR) 500 MG 24 hr tablet Take 500 mg by mouth 2 (two) times daily with a meal.       . metoprolol (LOPRESSOR) 50 MG tablet Take 50 mg by mouth 2 (two) times daily.      . nitroGLYCERIN (NITROSTAT) 0.4 MG SL tablet Place 0.4 mg under the tongue every 5 (five) minutes as needed for chest pain.      Marland Kitchen omeprazole (PRILOSEC) 20 MG capsule Take 20 mg by mouth daily.      . rosuvastatin (CRESTOR) 20 MG tablet Take 20 mg by mouth at bedtime.       No current facility-administered medications for this visit.    Allergies  Allergen Reactions  . Lipitor [Atorvastatin Calcium] Other (See Comments)    Muscle ache  . Sulfa Drugs Cross Reactors Other (See Comments)    "raw spots"  . Codeine Rash    Patient Active Problem List   Diagnosis Date Noted  . Peripheral vascular disease, unspecified 01/12/2014  . Atherosclerosis of native arteries of the extremities with ulceration(440.23) 12/08/2013  . Wound drainage 07/25/2013  . Occlusion and stenosis of carotid artery without mention of cerebral infarction 07/15/2013  . Right leg claudication 07/08/2013  . Right carotid bruit 07/08/2013  . Type II or unspecified  type diabetes mellitus without mention of complication, uncontrolled 12/18/2011  . CAD (coronary artery disease) 06/19/2011  . HTN (hypertension) 06/19/2011  . Hyperlipidemia 06/19/2011    History  Smoking status  . Former Smoker  . Quit date: 10/27/2001  Smokeless tobacco  . Not on file    History  Alcohol Use No    Family History  Problem Relation Age of Onset  . Heart attack Mother   . Diabetes Mother   . Heart disease Mother   . Hypertension Mother   . Heart attack Father   . Heart disease Father   . Hypertension Father   . Diabetes Brother   . Diabetes Sister   . Diabetes Daughter   . Heart disease Daughter   . Hypertension Daughter   . Diabetes Sister     Review of Systems: Constitutional: no  fever chills diaphoresis or fatigue or change in weight.  Head and neck: no hearing loss, no epistaxis, no photophobia or visual disturbance. Respiratory: No cough, shortness of breath or wheezing. Cardiovascular: No chest pain peripheral edema, palpitations. Gastrointestinal: No abdominal distention, no abdominal pain, no change in bowel habits hematochezia or melena. Genitourinary: No dysuria, no frequency, no urgency, no nocturia. Musculoskeletal:No arthralgias, no back pain, no gait disturbance or myalgias. Neurological: No dizziness, no headaches, no numbness, no seizures, no syncope, no weakness, no tremors. Hematologic: No lymphadenopathy, no easy bruising. Psychiatric: No confusion, no hallucinations, no sleep disturbance.    Physical Exam: Filed Vitals:   05/19/14 0859  BP: 156/80  Pulse: 76   the general appearance reveals a well-developed well-nourished gentleman in no distress.The head and neck exam reveals pupils equal and reactive.  Extraocular movements are full.  There is no scleral icterus.  The mouth and pharynx are normal.  The neck is supple.  The carotids reveal very faint bilateral bruits. The jugular venous pressure is normal.  The  thyroid is not enlarged.  There is no lymphadenopathy.  The chest is clear to percussion and auscultation.  There are no rales or rhonchi.  Expansion of the chest is symmetrical.  The precordium is quiet.  The first heart sound is normal.  The second heart sound is physiologically split.  There is grade 1/6 systolic ejection murmur at the base. There is no abnormal lift or heave.  The abdomen is soft and nontender.  The bowel sounds are normal.  The liver and spleen are not enlarged.  There are no abdominal masses.  There are no abdominal bruits.  Extremities reveal good pedal pulses.  There is no phlebitis or edema.  There is no cyanosis or clubbing.  Strength is normal and symmetrical in all extremities.  There is no lateralizing weakness.   There are no sensory deficits.  The skin is warm and dry.  There is no rash.     Assessment / Plan: 1.  Ischemic heart disease status post CABG 2003 2. diabetes mellitus insulin-dependent 3. hypertensive heart disease without heart failure 4. Hyperlipidemia 5. peripheral arterial occlusive disease followed by Dr. Oneida Alar 6. carotid artery disease followed by Dr. Oneida Alar  Disposition continue same medication.  Lab work today pending.  Recheck in 6 months for office visit EKG and fasting lab work

## 2014-05-19 NOTE — Assessment & Plan Note (Signed)
The patient is no longer having any claudication.  Activity level is normal.

## 2014-05-19 NOTE — Patient Instructions (Signed)
Will obtain labs today and call you with the results (lp/bmet/hfp)  Your physician recommends that you continue on your current medications as directed. Please refer to the Current Medication list given to you today.  Your physician wants you to follow-up in: 6 months with fasting labs (lp/bmet/hfp)  You will receive a reminder letter in the mail two months in advance. If you don't receive a letter, please call our office to schedule the follow-up appointment.  

## 2014-05-19 NOTE — Assessment & Plan Note (Signed)
The patient has not been experiencing any recurrent chest pain or angina.  He had bypass surgery in 2003.

## 2014-05-20 NOTE — Progress Notes (Signed)
Quick Note:  Please report to patient. The recent labs are stable. Continue same medication and careful diet. ______ 

## 2014-07-17 ENCOUNTER — Other Ambulatory Visit: Payer: Self-pay

## 2014-07-17 MED ORDER — ROSUVASTATIN CALCIUM 20 MG PO TABS: 20.0000 mg | ORAL_TABLET | Freq: Every day | ORAL | Status: DC

## 2014-07-24 ENCOUNTER — Other Ambulatory Visit: Payer: Self-pay

## 2014-07-24 MED ORDER — ROSUVASTATIN CALCIUM 20 MG PO TABS: 20.0000 mg | ORAL_TABLET | Freq: Every day | ORAL | Status: DC

## 2014-10-05 ENCOUNTER — Encounter (HOSPITAL_COMMUNITY): Payer: Self-pay | Admitting: Vascular Surgery

## 2014-10-11 ENCOUNTER — Encounter: Payer: Self-pay | Admitting: Vascular Surgery

## 2014-10-12 ENCOUNTER — Ambulatory Visit (INDEPENDENT_AMBULATORY_CARE_PROVIDER_SITE_OTHER): Payer: Medicare Other | Admitting: Vascular Surgery

## 2014-10-12 ENCOUNTER — Ambulatory Visit (INDEPENDENT_AMBULATORY_CARE_PROVIDER_SITE_OTHER)
Admission: RE | Admit: 2014-10-12 | Discharge: 2014-10-12 | Disposition: A | Discharge status: Home / Self Care | Payer: Medicare Other | Source: Ambulatory Visit | Attending: Vascular Surgery | Admitting: Vascular Surgery

## 2014-10-12 ENCOUNTER — Encounter: Payer: Self-pay | Admitting: Vascular Surgery

## 2014-10-12 ENCOUNTER — Ambulatory Visit (HOSPITAL_COMMUNITY)
Admission: RE | Admit: 2014-10-12 | Discharge: 2014-10-12 | Disposition: A | Discharge status: Home / Self Care | Payer: Medicare Other | Source: Ambulatory Visit | Attending: Vascular Surgery | Admitting: Vascular Surgery

## 2014-10-12 ENCOUNTER — Other Ambulatory Visit: Payer: Self-pay | Admitting: Vascular Surgery

## 2014-10-12 VITALS — BP 168/87 | HR 63 | Resp 14 | Ht 70.0 in | Wt 187.0 lb

## 2014-10-12 DIAGNOSIS — I739 Peripheral vascular disease, unspecified: Secondary | ICD-10-CM

## 2014-10-12 DIAGNOSIS — I6523 Occlusion and stenosis of bilateral carotid arteries: Secondary | ICD-10-CM

## 2014-10-12 NOTE — Progress Notes (Signed)
Patient is a 68 year old male who returns for followup today. He previously underwent right carotid endarterectomy approximately 10 years ago. He also had a right superficial femoral artery stent in February of 2015. He had a left carotid stent placed in October of 2014. He is on aspirin and Plavix. He denies any claudication symptoms currently. He is reported no symptoms of TIA amaurosis or stroke. Overall he feels well.   Review of systems: He has no history of chest pain or shortness of breath.  Physical exam:    Filed Vitals:   10/12/14 1527 10/12/14 1529  BP: 180/91 168/87  Pulse: 64 63  Resp: 14   Height: 5\' 10"  (1.778 m)   Weight: 187 lb (84.823 kg)     Neck: No right neck bruit appreciated, left carotid bruit is auscultated.  Chest clear to auscultation bilaterally Cardiac: Regular rate and rhythm without murmur Extremities: 2+ femoral pulses bilaterally 1+ popliteal pulses absent pedal pulses bilaterally  Data: Patient had previously signs of some recurrence on the left side velocities were 313/97 6 months ago. Today there is velocities were 320/93. Right side less than 40% stenosis. ABIs today were 0.88 right, left 0.85  He did have some increased velocities in the proximal SFA stent a 327 cm/s 6 months ago with velocities today being 451 cm/s.  Assessment: #1 patent right carotid endarterectomy #2 moderate recurrent stenosis left internal carotid artery stent stable over the last 6 months #3 moderate recurrent stenosis right superficial femoral artery stent currently asymptomatic  Plan: The patient will continue his Plavix and aspirin. He has refrained from smoking. He will need close followup. If his carotid stenosis velocities continued to increase he will need a repeat arteriogram. He'll return for repeat duplex exam of the lower extremities and carotid in 3 months. He will also need lower extremity arteriogram at the velocities in his stent remain high or continued to increase  or continued decline in his ABIs. He'll return sooner if he develops claudication symptoms or any symptoms of TIA or stroke symptoms.  Ruta Hinds, MD [facility] Office: [phone] Pager: [phone]

## 2014-11-21 ENCOUNTER — Ambulatory Visit (INDEPENDENT_AMBULATORY_CARE_PROVIDER_SITE_OTHER): Payer: Medicare HMO | Admitting: Cardiology

## 2014-11-21 ENCOUNTER — Other Ambulatory Visit (INDEPENDENT_AMBULATORY_CARE_PROVIDER_SITE_OTHER): Payer: Medicare HMO | Admitting: *Deleted

## 2014-11-21 ENCOUNTER — Encounter: Payer: Self-pay | Admitting: Cardiology

## 2014-11-21 VITALS — BP 156/80 | HR 67 | Ht 70.0 in | Wt 188.0 lb

## 2014-11-21 DIAGNOSIS — E785 Hyperlipidemia, unspecified: Secondary | ICD-10-CM

## 2014-11-21 DIAGNOSIS — I119 Hypertensive heart disease without heart failure: Secondary | ICD-10-CM

## 2014-11-21 DIAGNOSIS — I259 Chronic ischemic heart disease, unspecified: Secondary | ICD-10-CM

## 2014-11-21 LAB — BASIC METABOLIC PANEL
BUN: 13 mg/dL (ref 6–23)
CALCIUM: 9.7 mg/dL (ref 8.4–10.5)
CO2: 27 mEq/L (ref 19–32)
Chloride: 104 mEq/L (ref 96–112)
Creatinine, Ser: 0.9 mg/dL (ref 0.40–1.50)
GFR: 89.4 mL/min (ref >60.00)
GLUCOSE: 148 mg/dL — AB (ref 70–99)
Potassium: 3.6 mEq/L (ref 3.5–5.1)
Sodium: 139 mEq/L (ref 135–145)

## 2014-11-21 LAB — HEPATIC FUNCTION PANEL
ALT: 20 U/L (ref 0–53)
AST: 17 U/L (ref 0–37)
Albumin: 3.9 g/dL (ref 3.5–5.2)
Alkaline Phosphatase: 83 U/L (ref 39–117)
Bilirubin, Direct: 0.1 mg/dL (ref 0.0–0.3)
Total Bilirubin: 0.4 mg/dL (ref 0.2–1.2)
Total Protein: 7.1 g/dL (ref 6.0–8.3)

## 2014-11-21 LAB — LIPID PANEL
Cholesterol: 130 mg/dL (ref 0–200)
HDL: 30.5 mg/dL — ABNORMAL LOW (ref >39.00)
LDL Cholesterol: 67 mg/dL (ref 0–99)
NonHDL: 99.5
Total CHOL/HDL Ratio: 4
Triglycerides: 161 mg/dL — ABNORMAL HIGH (ref 0.0–149.0)
VLDL: 32.2 mg/dL (ref 0.0–40.0)

## 2014-11-21 MED ORDER — PRAVASTATIN SODIUM 40 MG PO TABS: 40.0000 mg | ORAL_TABLET | Freq: Every evening | ORAL | Status: DC

## 2014-11-21 MED ORDER — NITROGLYCERIN 0.4 MG SL SUBL: 0.4000 mg | SUBLINGUAL_TABLET | SUBLINGUAL | Status: DC | PRN

## 2014-11-21 NOTE — Progress Notes (Signed)
Cardiology Office Note   Date:  11/21/2014   ID:  [name], [name] 03-Feb-1946, MRN [id number]  PCP:  Stephens Shire, MD  Cardiologist:   Darlin Coco, MD   No chief complaint on file.  this pleasant 69 year old gentleman is seen for a six-month followup office visit. He has a past history of coronary artery disease and had coronary artery bypass graft surgery in 2003. He has a remote history of a right carotid endarterectomy about 18 years ago. He had a left carotid endarterectomy by Dr. Oneida Alar in 2014. He has a history of high blood pressure, diabetes mellitus, and hyperlipidemia. He had a normal nuclear stress test in June 2012 prior to rotator cuff surgery. The patient developed claudication of his right leg last year and underwent successful vascular surgery in February 2015. Since last visit he has been doing well with no chest pain or shortness of breath. Since last visit he has retired and has been enjoying retirement. He is no longer experiencing any right calf claudication. He has diabetes followed by Dr. Tamala Julian at [facility]. His most recent A1c was 7.3   History of Present Illness: [name] is a 69 y.o. male who presents for routine followup.  He checks his blood pressure at home and it is usually in the 130/70 range.  The Crestor is high-priced and we will try switching to Pravachol generic 40 mg one daily    Past Medical History  Diagnosis Date  . Hyperlipidemia   . Hypertension   . IHD (ischemic heart disease)     Prior CABG in 2003  . History of kidney stones   . Torn rotator cuff   . Normal nuclear stress test June 2012  . CAD (coronary artery disease)   . Dysrhythmia     skips beats  . Diabetes mellitus     fasting 100-120s  . Peripheral vascular disease   . GERD (gastroesophageal reflux disease)   . Dizziness   . Arthritis   . Carotid artery occlusion     Past Surgical History  Procedure Laterality Date  . Cardiac catheterization   05/03/2002    EF 40-45%  . Coronary artery bypass graft  04/2002  . Carotid endarterectomy    . Appendectomy    . Hernia repair    . Cardiovascular stress test  08/10/2006    EF 65%  . Doppler echocardiography  08/12/2002    EF 80-85%  . Shoulder surgery    . Endarterectomy Left 07/20/2013    Procedure: ATTEMPTED ENDARTERECTOMY CAROTID;  Surgeon: Conrad [place]ord[place], MD;  Location: [facility];  Service: Vascular;  Laterality: Left;  . Carotid stent insertion Left 08/26/2013    Procedure: CAROTID STENT INSERTION;  Surgeon: Elam Dutch, MD;  Location: [facility] CATH LAB;  Service: Cardiovascular;  Laterality: Left;  internal carotid  . Arch aortogram  08/26/2013    Procedure: ARCH AORTOGRAM;  Surgeon: Elam Dutch, MD;  Location: [facility] CATH LAB;  Service: Cardiovascular;;  . Abdominal aortagram N/A 12/21/2013    Procedure: ABDOMINAL Maxcine Ham;  Surgeon: Elam Dutch, MD;  Location: [facility] CATH LAB;  Service: Cardiovascular;  Laterality: N/A;     Current Outpatient Prescriptions  Medication Sig Dispense Refill  . amLODipine-benazepril (LOTREL) 5-20 MG per capsule Take 1 capsule by mouth daily.     Marland Kitchen aspirin EC 81 MG tablet Take 81 mg by mouth daily.    . clopidogrel (PLAVIX) 75 MG tablet Take 1 tablet (75 mg total) by  mouth daily. 90 tablet 6  . hydrochlorothiazide (HYDRODIURIL) 12.5 MG tablet Take 12.5 mg by mouth daily.     . Insulin Glargine (LANTUS SOLOSTAR) 100 UNIT/ML SOPN Inject 44 Units into the skin at bedtime.    . insulin lispro (HUMALOG PEN) 100 UNIT/ML SOPN Inject 10-15 Units into the skin See admin instructions. Takes 10 units at breakfast and lunch. Take 15 units at dinner; If blood sugar 150-200 take an additional 1 unit, if blood sugar 201-250 take an additional 2 units; If blood sugar 251-300 take an additional 3 units, if blood sugar >300 take an additional 4 units    . metFORMIN (GLUCOPHAGE-XR) 500 MG 24 hr tablet Take 500 mg by mouth 2 (two) times daily with a meal.     . metoprolol  (LOPRESSOR) 50 MG tablet Take 50 mg by mouth 2 (two) times daily.    . nitroGLYCERIN (NITROSTAT) 0.4 MG SL tablet Place 0.4 mg under the tongue every 5 (five) minutes as needed for chest pain.    Marland Kitchen omeprazole (PRILOSEC) 20 MG capsule Take 20 mg by mouth daily.    . rosuvastatin (CRESTOR) 20 MG tablet Take 1 tablet (20 mg total) by mouth at bedtime. 90 tablet 1   No current facility-administered medications for this visit.    Allergies:   Lipitor; Sulfa drugs cross reactors; and Codeine    Social History:  The patient  reports that he quit smoking about 13 years ago. He has never used smokeless tobacco. He reports that he does not drink alcohol or use illicit drugs.   Family History:  The patient's family history includes Diabetes in his brother, daughter, mother, sister, and sister; Heart attack in his father and mother; Heart disease in his daughter, father, and mother; Hypertension in his daughter, father, and mother.    ROS:  Please see the history of present illness.   Otherwise, review of systems are positive for none.   All other systems are reviewed and negative.    PHYSICAL EXAM: VS:  BP 156/80 mmHg  Pulse 67  Ht 5\' 10"  (1.778 m)  Wt 188 lb (85.276 kg)  BMI 26.98 kg/m2 , BMI Body mass index is 26.98 kg/(m^2). GEN: Well nourished, well developed, in no acute distress HEENT: normal Neck: no JVD, carotid bruits, or masses Cardiac: RRR; no murmurs, rubs, or gallops,no edema  Respiratory:  clear to auscultation bilaterally, normal work of breathing GI: soft, nontender, nondistended, + BS MS: no deformity or atrophy Skin: warm and dry, no rash Neuro:  Strength and sensation are intact Psych: euthymic mood, full affect   EKG:  EKG is ordered today. The ekg ordered today demonstrates normal sinus rhythm with occasional PVCs.  No acute changes.  Old inferior wall myocardial infarction.   Recent Labs: 12/21/2013: Hemoglobin 17.7 05/19/2014: ALT 22; BUN 16; Creatinine 0.9;  Potassium 3.8; Sodium 136    Lipid Panel    Component Value Date/Time   CHOL 121 05/19/2014 0918   TRIG 118.0 05/19/2014 0918   HDL 32.50 05/19/2014 0918   CHOLHDL 4 05/19/2014 0918   VLDL 23.6 05/19/2014 0918   LDLCALC 65 05/19/2014 0918      Wt Readings from Last 3 Encounters:  11/21/14 188 lb (85.276 kg)  10/12/14 187 lb (84.823 kg)  05/19/14 190 lb (86.183 kg)      Other studies Reviewed: Additional studies/ records that were reviewed today include:  Review of the above records demonstrates:    ASSESSMENT AND PLAN:  1. Ischemic  heart disease status post CABG 2003 2. diabetes mellitus insulin-dependent 3. hypertensive heart disease without heart failure 4. Hyperlipidemia 5. peripheral arterial occlusive disease followed by Dr. Oneida Alar 6. carotid artery disease followed by Dr. Oneida Alar   Current medicines are reviewed at length with the patient today.  The patient does not have concerns regarding medicines.Trial of pravastatin because of cost  The following changes have been made: Stop crestor. Switch to pravachol 40 mg daily.  Labs/ tests ordered today include:  No orders of the defined types were placed in this encounter.     Disposition:   FU with  in 6 months for office visit LP HFP BMET   Signed, Darlin Coco, MD  11/21/2014 7:56 AM    [address], [place], [place]  [address] Phone: [phone]; Fax: [phone]

## 2014-11-21 NOTE — Patient Instructions (Addendum)
STOP CRESTOR  START PRAVASTATIN 40 MG DAILY  Your physician wants you to follow-up in: *6 months with fasting labs (lp/bmet/hfp)  You will receive a reminder letter in the mail two months in advance. If you don't receive a letter, please call our office to schedule the follow-up appointment.

## 2014-11-22 NOTE — Progress Notes (Signed)
Quick Note:  Please report to patient. The recent labs are stable. Continue same medication and careful diet. Triglycerides are higher. Watch carbohydrates. Blood sugar is better but still elevated at 148. ______

## 2015-01-10 ENCOUNTER — Encounter: Payer: Self-pay | Admitting: Vascular Surgery

## 2015-01-11 ENCOUNTER — Ambulatory Visit (HOSPITAL_COMMUNITY)
Admission: RE | Admit: 2015-01-11 | Discharge: 2015-01-11 | Disposition: A | Discharge status: Home / Self Care | Payer: Medicare HMO | Source: Ambulatory Visit | Attending: Vascular Surgery | Admitting: Vascular Surgery

## 2015-01-11 ENCOUNTER — Ambulatory Visit (INDEPENDENT_AMBULATORY_CARE_PROVIDER_SITE_OTHER)
Admission: RE | Admit: 2015-01-11 | Discharge: 2015-01-11 | Disposition: A | Discharge status: Home / Self Care | Payer: Medicare HMO | Source: Ambulatory Visit | Attending: Vascular Surgery | Admitting: Vascular Surgery

## 2015-01-11 ENCOUNTER — Encounter: Payer: Self-pay | Admitting: Vascular Surgery

## 2015-01-11 ENCOUNTER — Ambulatory Visit (INDEPENDENT_AMBULATORY_CARE_PROVIDER_SITE_OTHER): Payer: Medicare HMO | Admitting: Vascular Surgery

## 2015-01-11 VITALS — BP 172/82 | HR 57 | Ht 70.0 in | Wt 184.0 lb

## 2015-01-11 DIAGNOSIS — I739 Peripheral vascular disease, unspecified: Secondary | ICD-10-CM | POA: Diagnosis not present

## 2015-01-11 DIAGNOSIS — Z48812 Encounter for surgical aftercare following surgery on the circulatory system: Secondary | ICD-10-CM

## 2015-01-11 DIAGNOSIS — I6523 Occlusion and stenosis of bilateral carotid arteries: Secondary | ICD-10-CM | POA: Diagnosis not present

## 2015-01-11 NOTE — Progress Notes (Signed)
Patient is a 69 year old male who returns for followup today. He previously underwent right carotid endarterectomy approximately 10 years ago. He also had a right superficial femoral artery stent in February of 2015. He had a left carotid stent placed in October of 2014. He is on aspirin and Plavix. He denies any claudication symptoms currently. He is reported no symptoms of TIA amaurosis or stroke. Overall he feels well.   Review of systems: He has no history of chest pain or shortness of breath.  Physical exam:    Filed Vitals:   01/11/15 1128 01/11/15 1132  BP: 158/81 172/82  Pulse: 57   Height: 5\' 10"  (1.778 m)   Weight: 184 lb (83.462 kg)   SpO2: 97%     Neck: No right neck bruit appreciated, left carotid bruit is auscultated.   Chest clear to auscultation bilaterally Cardiac: Regular rate and rhythm without murmur  Data: Patient had previously signs of some recurrence on the left side velocities were 313/97 6 months ago. 3 months ago velocities were 320/93. Right side less than 40% stenosis.   Prior ABIs 3 months ago were 0.88 right, left 0.85  He did have some increased velocities in the proximal SFA stent a 327 cm/s 6 months ago with velocities today being 451 cm/s.  Duplex scan today shows again possible mid stenosis of the stent with velocities today decreased to 281/91 still 60-80% stenosis. Less than 40% stenosis on the right side.  ABIs today were 0.85 on the right 0.81 on the left essentially unchanged from 3 months ago.  Assessment: #1 patent right carotid endarterectomy #2 moderate recurrent stenosis left internal carotid artery stent stable over the last 9 months #3 moderate recurrent stenosis right superficial femoral artery stent currently asymptomatic  Plan: The patient will continue his Plavix and aspirin. He has refrained from smoking. He will need close followup. If his carotid stenosis velocities continued to increase he will need a repeat arteriogram. He'll  return for repeat duplex exam of his carotids in 6 months. We'll also need a repeat duplex of his lower extremities and ABIs. He'll return sooner if he develops claudication symptoms or any symptoms of TIA or stroke symptoms.  Ruta Hinds, MD [facility] Office: [phone] Pager: [phone]

## 2015-01-11 NOTE — Addendum Note (Signed)
Addended by: Thresa Ross C on: 01/11/2015 03:50 PM   Modules accepted: Orders

## 2015-05-23 ENCOUNTER — Ambulatory Visit (INDEPENDENT_AMBULATORY_CARE_PROVIDER_SITE_OTHER): Payer: Medicare HMO | Admitting: Cardiology

## 2015-05-23 ENCOUNTER — Encounter: Payer: Self-pay | Admitting: Cardiology

## 2015-05-23 DIAGNOSIS — I119 Hypertensive heart disease without heart failure: Secondary | ICD-10-CM

## 2015-05-23 DIAGNOSIS — E785 Hyperlipidemia, unspecified: Secondary | ICD-10-CM | POA: Diagnosis not present

## 2015-05-23 LAB — LIPID PANEL
Cholesterol: 170 mg/dL (ref 0–200)
HDL: 28.4 mg/dL — ABNORMAL LOW (ref >39.00)
LDL Cholesterol: 103 mg/dL — ABNORMAL HIGH (ref 0–99)
NonHDL: 141.6
Total CHOL/HDL Ratio: 6
Triglycerides: 195 mg/dL — ABNORMAL HIGH (ref 0.0–149.0)
VLDL: 39 mg/dL (ref 0.0–40.0)

## 2015-05-23 LAB — BASIC METABOLIC PANEL
BUN: 16 mg/dL (ref 6–23)
CO2: 27 mEq/L (ref 19–32)
Calcium: 9.7 mg/dL (ref 8.4–10.5)
Chloride: 103 mEq/L (ref 96–112)
Creatinine, Ser: 1.02 mg/dL (ref 0.40–1.50)
GFR: 77.26 mL/min (ref >60.00)
Glucose, Bld: 182 mg/dL — ABNORMAL HIGH (ref 70–99)
Potassium: 4.4 mEq/L (ref 3.5–5.1)
SODIUM: 136 meq/L (ref 135–145)

## 2015-05-23 LAB — HEPATIC FUNCTION PANEL
ALK PHOS: 82 U/L (ref 39–117)
ALT: 18 U/L (ref 0–53)
AST: 20 U/L (ref 0–37)
Albumin: 4.1 g/dL (ref 3.5–5.2)
BILIRUBIN DIRECT: 0.1 mg/dL (ref 0.0–0.3)
Total Bilirubin: 0.5 mg/dL (ref 0.2–1.2)
Total Protein: 7.6 g/dL (ref 6.0–8.3)

## 2015-05-23 NOTE — Progress Notes (Signed)
Cardiology Office Note   Date:  05/23/2015   ID:  [name], DOB 08-10-1946, MRN [id number]  PCP:  Stephens Shire, MD  Cardiologist: Darlin Coco MD  No chief complaint on file.     History of Present Illness: [name] is a 69 y.o. male who presents for a six-month follow-up visit.  this pleasant 69 year old gentleman is seen for a six-month followup office visit. He has a past history of coronary artery disease and had coronary artery bypass graft surgery in 2003. He has a remote history of a right carotid endarterectomy about 18 years ago. He had a left carotid endarterectomy by Dr. Oneida Alar in 2014. He has a history of high blood pressure, diabetes mellitus, and hyperlipidemia. He had a normal nuclear stress test in June 2012 prior to rotator cuff surgery. The patient developed claudication of his right leg last year and underwent successful vascular surgery in February 2015. Since last visit he has been doing well with no chest pain or shortness of breath. Since last visit he has retired and has been enjoying retirement. He is no longer experiencing any right calf claudication. He has diabetes followed by Dr. Tamala Julian at [facility]. His most recent A1c was 7.2 Since last visit the patient has had no new cardiac symptoms.  Blood pressure here in the office today with his slightly high but at home is normal.  Past Medical History  Diagnosis Date  . Hyperlipidemia   . Hypertension   . IHD (ischemic heart disease)     Prior CABG in 2003  . History of kidney stones   . Torn rotator cuff   . Normal nuclear stress test June 2012  . CAD (coronary artery disease)   . Dysrhythmia     skips beats  . Diabetes mellitus     fasting 100-120s  . Peripheral vascular disease   . GERD (gastroesophageal reflux disease)   . Dizziness   . Arthritis   . Carotid artery occlusion     Past Surgical History  Procedure Laterality Date  . Cardiac catheterization  05/03/2002    EF  40-45%  . Coronary artery bypass graft  04/2002  . Carotid endarterectomy    . Appendectomy    . Hernia repair    . Cardiovascular stress test  08/10/2006    EF 65%  . Doppler echocardiography  08/12/2002    EF 80-85%  . Shoulder surgery    . Endarterectomy Left 07/20/2013    Procedure: ATTEMPTED ENDARTERECTOMY CAROTID;  Surgeon: Conrad [place], MD;  Location: [facility];  Service: Vascular;  Laterality: Left;  . Carotid stent insertion Left 08/26/2013    Procedure: CAROTID STENT INSERTION;  Surgeon: Elam Dutch, MD;  Location: [facility] CATH LAB;  Service: Cardiovascular;  Laterality: Left;  internal carotid  . Arch aortogram  08/26/2013    Procedure: ARCH AORTOGRAM;  Surgeon: Elam Dutch, MD;  Location: [facility] CATH LAB;  Service: Cardiovascular;;  . Abdominal aortagram N/A 12/21/2013    Procedure: ABDOMINAL Maxcine Ham;  Surgeon: Elam Dutch, MD;  Location: [facility] CATH LAB;  Service: Cardiovascular;  Laterality: N/A;     Current Outpatient Prescriptions  Medication Sig Dispense Refill  . amLODipine-benazepril (LOTREL) 5-20 MG per capsule Take 1 capsule by mouth daily.     Marland Kitchen aspirin EC 81 MG tablet Take 81 mg by mouth daily.    . clopidogrel (PLAVIX) 75 MG tablet Take 1 tablet (75 mg total) by mouth daily. 90 tablet 6  .  hydrochlorothiazide (HYDRODIURIL) 12.5 MG tablet Take 12.5 mg by mouth daily.     . Insulin Glargine (LANTUS SOLOSTAR) 100 UNIT/ML SOPN Inject 44 Units into the skin at bedtime.    . insulin lispro (HUMALOG PEN) 100 UNIT/ML SOPN Inject 10-15 Units into the skin See admin instructions. Takes 10 units at breakfast and lunch. Take 15 units at dinner; If blood sugar 150-200 take an additional 1 unit, if blood sugar 201-250 take an additional 2 units; If blood sugar 251-300 take an additional 3 units, if blood sugar >300 take an additional 4 units    . metFORMIN (GLUCOPHAGE-XR) 500 MG 24 hr tablet Take 500 mg by mouth 2 (two) times daily with a meal.     . metoprolol (LOPRESSOR) 50 MG  tablet Take 50 mg by mouth 2 (two) times daily.    . nitroGLYCERIN (NITROSTAT) 0.4 MG SL tablet Place 1 tablet (0.4 mg total) under the tongue every 5 (five) minutes as needed for chest pain. 100 tablet PRN  . omeprazole (PRILOSEC) 20 MG capsule Take 20 mg by mouth daily.    . pravastatin (PRAVACHOL) 40 MG tablet Take 1 tablet (40 mg total) by mouth every evening. 90 tablet 3   No current facility-administered medications for this visit.    Allergies:   Lipitor; Sulfa drugs cross reactors; and Codeine    Social History:  The patient  reports that he quit smoking about 13 years ago. He has never used smokeless tobacco. He reports that he does not drink alcohol or use illicit drugs.   Family History:  The patient's family history includes Diabetes in his brother, daughter, mother, sister, and sister; Heart attack in his father and mother; Heart disease in his daughter, father, and mother; Hypertension in his daughter, father, and mother.    ROS:  Please see the history of present illness.   Otherwise, review of systems are positive for none.   All other systems are reviewed and negative.    PHYSICAL EXAM: VS:  BP 160/70 mmHg  Pulse 60  Ht 5\' 10"  (1.778 m)  Wt 191 lb 12.8 oz (87 kg)  BMI 27.52 kg/m2 , BMI Body mass index is 27.52 kg/(m^2). GEN: Well nourished, well developed, in no acute distress HEENT: normal Neck: no JVD, carotid bruits, or masses Cardiac: RRR; no murmurs, rubs, or gallops,no edema  Respiratory:  clear to auscultation bilaterally, normal work of breathing GI: soft, nontender, nondistended, + BS MS: no deformity or atrophy Skin: warm and dry, no rash Neuro:  Strength and sensation are intact Psych: euthymic mood, full affect   EKG:  EKG is not ordered today.    Recent Labs: 05/23/2015: ALT 18; BUN 16; Creatinine, Ser 1.02; Potassium 4.4; Sodium 136    Lipid Panel    Component Value Date/Time   CHOL 170 05/23/2015 0916   TRIG 195.0* 05/23/2015 0916   HDL  28.40* 05/23/2015 0916   CHOLHDL 6 05/23/2015 0916   VLDL 39.0 05/23/2015 0916   LDLCALC 103* 05/23/2015 0916      Wt Readings from Last 3 Encounters:  05/23/15 191 lb 12.8 oz (87 kg)  01/11/15 184 lb (83.462 kg)  11/21/14 188 lb (85.276 kg)        ASSESSMENT AND PLAN:  1. Ischemic heart disease status post CABG 2003 2. diabetes mellitus insulin-dependent followed by Dr. Tamala Julian at [facility] in [facility] 3. hypertensive heart disease without heart failure 4. Hyperlipidemia 5. peripheral arterial occlusive disease followed by Dr. Oneida Alar 6. carotid  artery disease followed by Dr. Oneida Alar  At his last visit at his request we switched him from Crestor to pravastatin because of cost.  Labs today are pending.  Current medicines are reviewed at length with the patient today. The patient does not have concerns regarding medicines.  The following changes have been made: None   Current medicines are reviewed at length with the patient today.  The patient does not have concerns regarding medicines.  The following changes have been made:  no change  Labs/ tests ordered today include:  No orders of the defined types were placed in this encounter.    Continue current medication.  Recheck in 6 months for follow-up office visit EKG and fasting lipid panel hepatic function panel and basal metabolic panel.  Berna Spare MD 05/23/2015 1:17 PM    [facility] [place], [place], [place]  [address] Phone: [phone]; Fax: [phone]

## 2015-05-23 NOTE — Patient Instructions (Signed)
Medication Instructions:  Your physician recommends that you continue on your current medications as directed. Please refer to the Current Medication list given to you today.  Labwork: Lp/bmet/hfp  Testing/Procedures: none  Follow-Up: Your physician wants you to follow-up in: 6 months with fasting labs (lp/bmet/hfp)  You will receive a reminder letter in the mail two months in advance. If you don't receive a letter, please call our office to schedule the follow-up appointment.    

## 2015-05-24 ENCOUNTER — Telehealth: Payer: Self-pay | Admitting: *Deleted

## 2015-05-24 MED ORDER — ROSUVASTATIN CALCIUM 20 MG PO TABS: 20.0000 mg | ORAL_TABLET | Freq: Every day | ORAL | Status: DC

## 2015-05-24 NOTE — Telephone Encounter (Signed)
Advised patient of lab results  

## 2015-05-24 NOTE — Telephone Encounter (Signed)
-----   Message from Darlin Coco, MD sent at 05/23/2015  9:32 PM EDT ----- The LDL is not as good since switching from crestor to pravachol. Target for him with his diabetes and his CABG and vascular disease is in the 79s.  I would recommend that if at all possible that he go back to Crestor 20 mg daily.

## 2015-07-19 ENCOUNTER — Other Ambulatory Visit: Payer: Self-pay | Admitting: Vascular Surgery

## 2015-07-19 ENCOUNTER — Ambulatory Visit (INDEPENDENT_AMBULATORY_CARE_PROVIDER_SITE_OTHER)
Admission: RE | Admit: 2015-07-19 | Discharge: 2015-07-19 | Disposition: A | Discharge status: Home / Self Care | Payer: Medicare HMO | Source: Ambulatory Visit | Attending: Vascular Surgery | Admitting: Vascular Surgery

## 2015-07-19 ENCOUNTER — Ambulatory Visit: Payer: Medicare HMO | Admitting: Vascular Surgery

## 2015-07-19 ENCOUNTER — Ambulatory Visit (HOSPITAL_COMMUNITY)
Admission: RE | Admit: 2015-07-19 | Discharge: 2015-07-19 | Disposition: A | Discharge status: Home / Self Care | Payer: Medicare HMO | Source: Ambulatory Visit | Attending: Vascular Surgery | Admitting: Vascular Surgery

## 2015-07-19 DIAGNOSIS — I6523 Occlusion and stenosis of bilateral carotid arteries: Secondary | ICD-10-CM | POA: Insufficient documentation

## 2015-07-19 DIAGNOSIS — I739 Peripheral vascular disease, unspecified: Secondary | ICD-10-CM

## 2015-07-19 DIAGNOSIS — Z95828 Presence of other vascular implants and grafts: Secondary | ICD-10-CM | POA: Diagnosis not present

## 2015-07-19 DIAGNOSIS — Z48812 Encounter for surgical aftercare following surgery on the circulatory system: Secondary | ICD-10-CM

## 2015-07-19 DIAGNOSIS — E119 Type 2 diabetes mellitus without complications: Secondary | ICD-10-CM | POA: Diagnosis not present

## 2015-07-19 DIAGNOSIS — I10 Essential (primary) hypertension: Secondary | ICD-10-CM | POA: Diagnosis not present

## 2015-07-19 DIAGNOSIS — E785 Hyperlipidemia, unspecified: Secondary | ICD-10-CM | POA: Insufficient documentation

## 2015-07-19 DIAGNOSIS — Z87891 Personal history of nicotine dependence: Secondary | ICD-10-CM | POA: Diagnosis not present

## 2015-07-31 ENCOUNTER — Encounter: Payer: Self-pay | Admitting: Vascular Surgery

## 2015-08-02 ENCOUNTER — Encounter: Payer: Self-pay | Admitting: Vascular Surgery

## 2015-08-02 ENCOUNTER — Ambulatory Visit (INDEPENDENT_AMBULATORY_CARE_PROVIDER_SITE_OTHER): Payer: Medicare HMO | Admitting: Vascular Surgery

## 2015-08-02 VITALS — BP 138/77 | HR 60 | Ht 70.0 in | Wt 189.0 lb

## 2015-08-02 DIAGNOSIS — I739 Peripheral vascular disease, unspecified: Secondary | ICD-10-CM

## 2015-08-02 DIAGNOSIS — I6523 Occlusion and stenosis of bilateral carotid arteries: Secondary | ICD-10-CM | POA: Diagnosis not present

## 2015-08-02 NOTE — Addendum Note (Signed)
Addended by: Dorthula Rue L on: 08/02/2015 03:18 PM   Modules accepted: Orders

## 2015-08-02 NOTE — Progress Notes (Signed)
Patient is a 69 year old male who returns for followup today. He previously underwent right carotid endarterectomy approximately 10 years ago. He also had a right superficial femoral artery stent in February of 2015. He had a left carotid stent placed in October of 2014. He is on aspirin and Plavix. He denies any claudication symptoms currently. He reported no symptoms of TIA amaurosis or stroke. Overall he feels well.   Review of systems: He has no history of chest pain or shortness of breath.  Physical exam:    Filed Vitals:   08/02/15 0832 08/02/15 0834  BP: 137/78 138/77  Pulse: 60   Height: 5\' 10"  (1.778 m)   Weight: 189 lb (85.73 kg)   SpO2: 100%     Neck: No right neck bruit appreciated, left carotid bruit soft/faint.   Chest clear to auscultation bilaterally Cardiac: Regular rate and rhythm without murmur Extremities: 2+ femoral pulses bilaterally, no palpable pedal pulses  Data:    ABIs September 22 were 0.85 on the right 0.81 on the left essentially unchanged from 3 months ago.He did have some increased velocities in the proximal SFA stent a 377 cm/s which has been fairly consistent over the last year with no real change  Carotid Duplex scan September 22 shows again possible mid stenosis of the stent with velocities today decreased to 223/52 still 60-80% stenosis but less than 6 months ago. Less than 40% stenosis on the right side.    Assessment: #1 patent right carotid endarterectomy  #2 moderate recurrent stenosis left internal carotid artery stent stable over the last year  #3 moderate recurrent stenosis right superficial femoral artery stent currently asymptomatic  Plan: The patient will continue his Plavix and aspirin. He has refrained from smoking. He'll return for repeat duplex exam of his carotids in one year. We'll also need a repeat duplex of his lower extremities and ABIs. He'll return sooner if he develops claudication symptoms or any symptoms of TIA or stroke  symptoms.  Ruta Hinds, MD [facility] Office: [phone] Pager: [phone]

## 2015-09-17 IMAGING — CR DG CHEST 2V
2 series · 2 of 2 positions shown · non-contrast
Comparison: 05/26/2011

CLINICAL DATA: Carotid surgery.

EXAM:
CHEST  2 VIEW

[w chest pa]
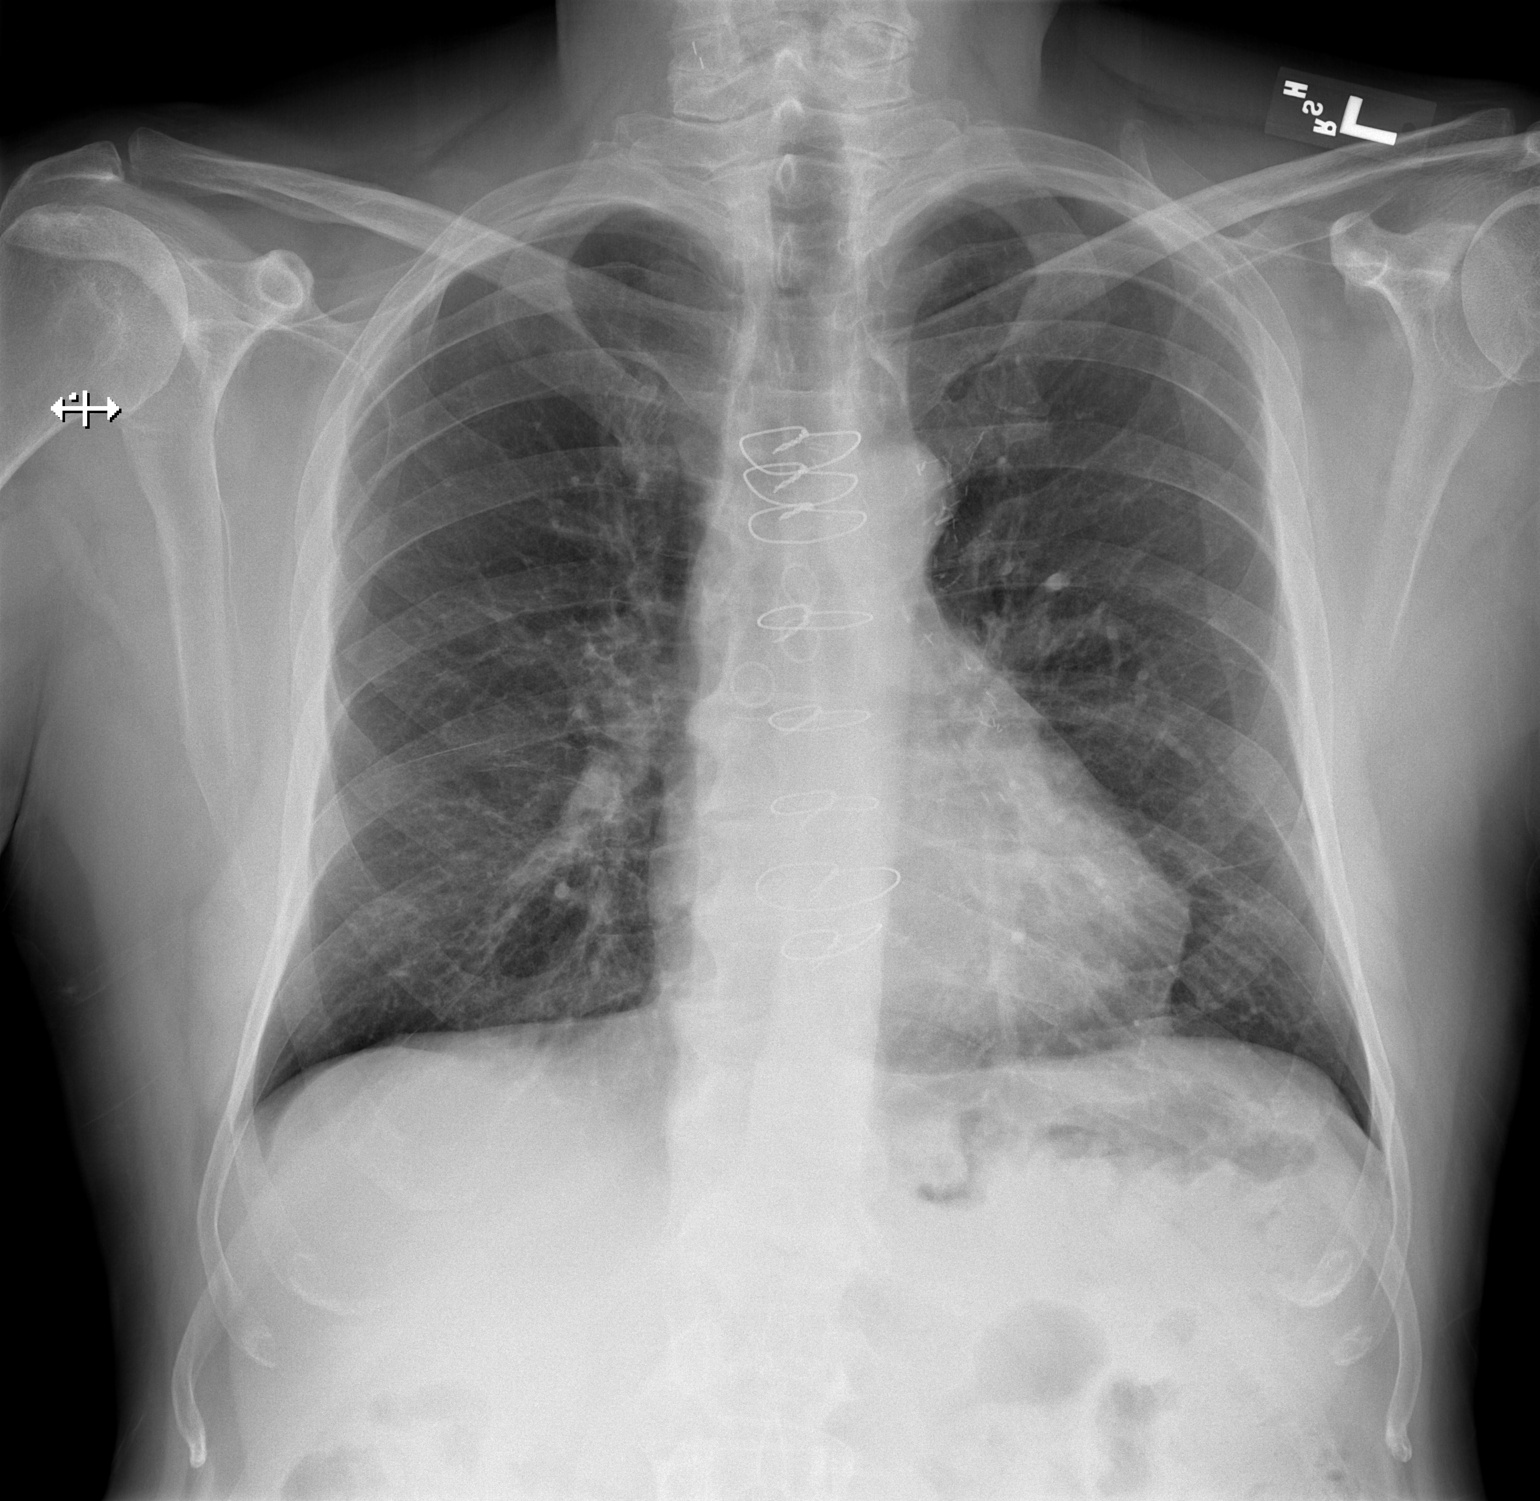

[w chest lat]
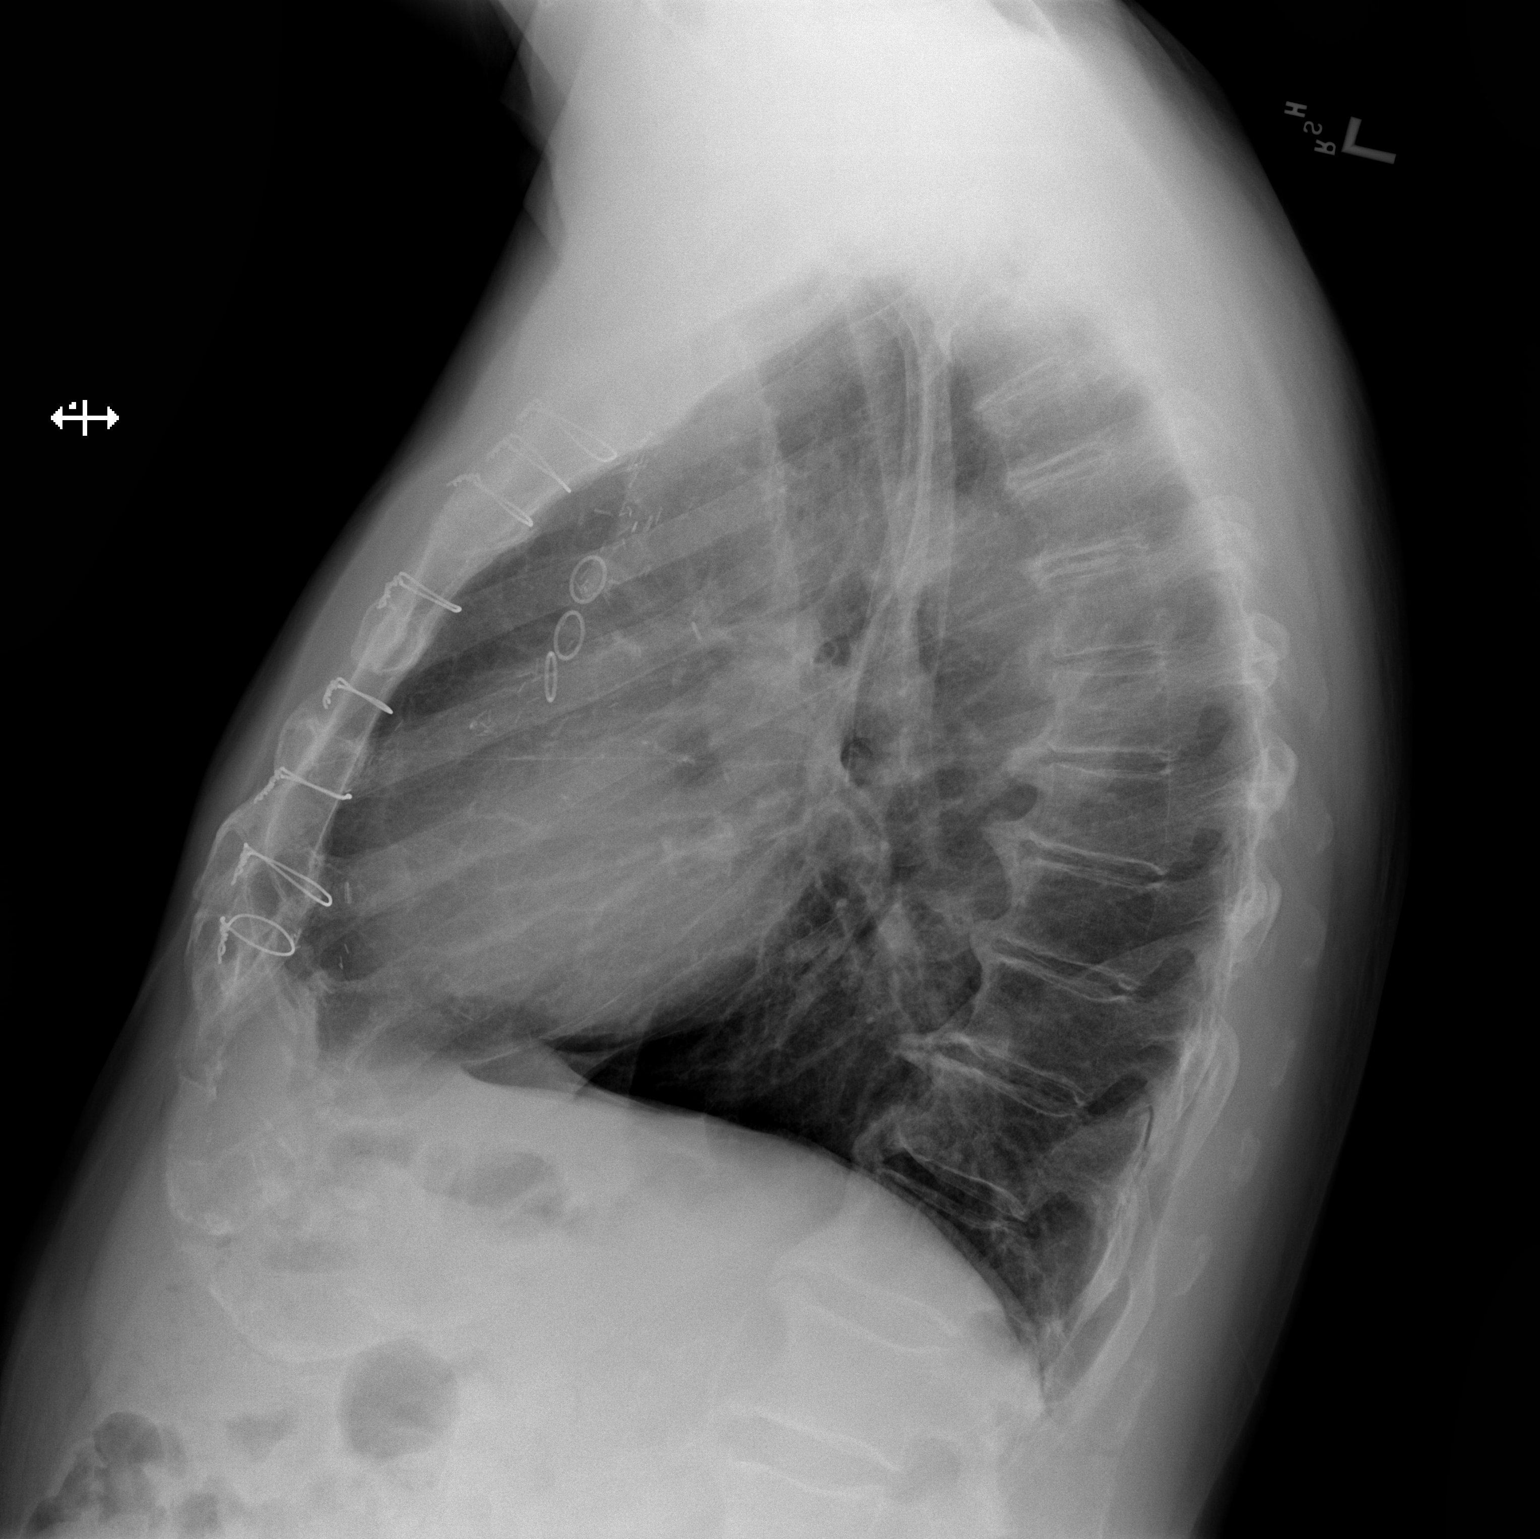

[2 of 2 positions shown; findings below may reference images not displayed]

FINDINGS: The patient has had median sternotomy and CABG. The heart is normal
in size. There are no focal consolidations or pleural effusions. No
pulmonary edema. Perihilar bronchitic changes are present.
Degenerative changes are seen in the thoracic spine.
IMPRESSION: 1.  No evidence for acute cardiopulmonary abnormality.
2. Bronchitic changes.

## 2015-11-26 ENCOUNTER — Ambulatory Visit: Payer: Medicare HMO | Admitting: Cardiology

## 2015-11-28 ENCOUNTER — Encounter: Payer: Self-pay | Admitting: Cardiology

## 2015-11-28 ENCOUNTER — Ambulatory Visit (INDEPENDENT_AMBULATORY_CARE_PROVIDER_SITE_OTHER): Payer: Medicare HMO | Admitting: Cardiology

## 2015-11-28 VITALS — BP 130/70 | HR 85 | Ht 70.0 in | Wt 187.1 lb

## 2015-11-28 DIAGNOSIS — I259 Chronic ischemic heart disease, unspecified: Secondary | ICD-10-CM | POA: Diagnosis not present

## 2015-11-28 DIAGNOSIS — I119 Hypertensive heart disease without heart failure: Secondary | ICD-10-CM | POA: Diagnosis not present

## 2015-11-28 DIAGNOSIS — I739 Peripheral vascular disease, unspecified: Secondary | ICD-10-CM

## 2015-11-28 DIAGNOSIS — E785 Hyperlipidemia, unspecified: Secondary | ICD-10-CM | POA: Diagnosis not present

## 2015-11-28 LAB — HEPATIC FUNCTION PANEL
ALBUMIN: 3.9 g/dL (ref 3.6–5.1)
ALT: 19 U/L (ref 9–46)
AST: 22 U/L (ref 10–35)
Alkaline Phosphatase: 69 U/L (ref 40–115)
BILIRUBIN INDIRECT: 0.4 mg/dL (ref 0.2–1.2)
Bilirubin, Direct: 0.1 mg/dL (ref <=0.2)
Total Bilirubin: 0.5 mg/dL (ref 0.2–1.2)
Total Protein: 7.3 g/dL (ref 6.1–8.1)

## 2015-11-28 LAB — LIPID PANEL
Cholesterol: 103 mg/dL — ABNORMAL LOW (ref 125–200)
HDL: 26 mg/dL — ABNORMAL LOW (ref >=40)
LDL CALC: 53 mg/dL (ref <130)
TRIGLYCERIDES: 118 mg/dL (ref <150)
Total CHOL/HDL Ratio: 4 Ratio (ref <=5.0)
VLDL: 24 mg/dL (ref <30)

## 2015-11-28 LAB — BASIC METABOLIC PANEL
BUN: 16 mg/dL (ref 7–25)
CHLORIDE: 105 mmol/L (ref 98–110)
CO2: 25 mmol/L (ref 20–31)
Calcium: 9.3 mg/dL (ref 8.6–10.3)
Creat: 1.03 mg/dL (ref 0.70–1.25)
Glucose, Bld: 144 mg/dL — ABNORMAL HIGH (ref 65–99)
POTASSIUM: 4 mmol/L (ref 3.5–5.3)
Sodium: 139 mmol/L (ref 135–146)

## 2015-11-28 MED ORDER — ROSUVASTATIN CALCIUM 20 MG PO TABS: 20.0000 mg | ORAL_TABLET | Freq: Every day | ORAL | Status: DC

## 2015-11-28 NOTE — Patient Instructions (Addendum)
Medication Instructions:  Your physician recommends that you continue on your current medications as directed. Please refer to the Current Medication list given to you today.  Labwork: Lp/bmet/hfp  Testing/Procedures: none  Follow-Up: Your physician wants you to follow-up in: 6 month ov with Dr Vilinda Boehringer will receive a reminder letter in the mail two months in advance. If you don't receive a letter, please call our office to schedule the follow-up appointment.   If you need a refill on your cardiac medications before your next appointment, please call your pharmacy.

## 2015-11-28 NOTE — Progress Notes (Signed)
Cardiology Office Note   Date:  11/28/2015   ID:  KHOEN ESCALON, DOB 23-Jul-1946, MRN [id number]  PCP:  Stephens Shire, MD  Cardiologist: Darlin Coco MD  Chief Complaint  Patient presents with  . routine follow up    CAD. Denies cp, sob, le edema, or claudication      History of Present Illness: [name] is a 70 y.o. male who presents for a six-month follow-up visit  . He has a past history of coronary artery disease and had coronary artery bypass graft surgery in 2003. He has a remote history of a right carotid endarterectomy about 18 years ago. He had a left carotid endarterectomy by Dr. Oneida Alar in 2014. He has a history of high blood pressure, diabetes mellitus, and hyperlipidemia. He had a normal nuclear stress test in June 2012 prior to rotator cuff surgery. The patient developed claudication of his right leg  and underwent successful vascular surgery in February 2015. Since last visit he has been doing well with no chest pain or shortness of breath. Since last visit he has retired and has been enjoying retirement. He is no longer experiencing any right calf claudication. He has diabetes followed by Dr. Tamala Julian at [facility]. His most recent A1c was 7.2 Since last visit the patient has had no new cardiac symptoms.  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  He himself lives in a townhouse.   Past Medical History  Diagnosis Date  . Hyperlipidemia   . Hypertension   . IHD (ischemic heart disease)     Prior CABG in 2003  . History of kidney stones   . Torn rotator cuff   . Normal nuclear stress test June 2012  . CAD (coronary artery disease)   . Dysrhythmia     skips beats  . Diabetes mellitus     fasting 100-120s  . Peripheral vascular disease ([place])   . GERD (gastroesophageal reflux disease)   . Dizziness   . Arthritis   . Carotid artery occlusion     Past Surgical History    Procedure Laterality Date  . Cardiac catheterization  05/03/2002    EF 40-45%  . Coronary artery bypass graft  04/2002  . Carotid endarterectomy    . Appendectomy    . Hernia repair    . Cardiovascular stress test  08/10/2006    EF 65%  . Doppler echocardiography  08/12/2002    EF 80-85%  . Shoulder surgery    . Endarterectomy Left 07/20/2013    Procedure: ATTEMPTED ENDARTERECTOMY CAROTID;  Surgeon: Conrad Novice, MD;  Location: [facility];  Service: Vascular;  Laterality: Left;  . Carotid stent insertion Left 08/26/2013    Procedure: CAROTID STENT INSERTION;  Surgeon: Elam Dutch, MD;  Location: [facility] CATH LAB;  Service: Cardiovascular;  Laterality: Left;  internal carotid  . Arch aortogram  08/26/2013    Procedure: ARCH AORTOGRAM;  Surgeon: Elam Dutch, MD;  Location: [facility] CATH LAB;  Service: Cardiovascular;;  . Abdominal aortagram N/A 12/21/2013    Procedure: ABDOMINAL Maxcine Ham;  Surgeon: Elam Dutch, MD;  Location: [facility] CATH LAB;  Service: Cardiovascular;  Laterality: N/A;     Current Outpatient Prescriptions  Medication Sig Dispense Refill  . amLODipine-benazepril (LOTREL) 5-20 MG per capsule Take 1 capsule by mouth daily.     Marland Kitchen aspirin EC 81 MG tablet Take 81 mg by mouth  daily.    . clopidogrel (PLAVIX) 75 MG tablet Take 1 tablet (75 mg total) by mouth daily. 90 tablet 6  . hydrochlorothiazide (HYDRODIURIL) 12.5 MG tablet Take 12.5 mg by mouth daily.     . Insulin Glargine (LANTUS SOLOSTAR) 100 UNIT/ML SOPN Inject 44 Units into the skin at bedtime.    . insulin lispro (HUMALOG) 100 UNIT/ML KiwkPen Inject 15 Units into the skin 3 (three) times daily. If blood glucose is between 150-200 inject an additional 1 unit into the skin. If blood glucose is between 201-250 inject an additional 2 units into the skin once. If blood glucose is between 251-300 inject an additional 3 units into the skin once. If blood glucose is higher than 300, inject an additional 4 units into the skin once.     . metFORMIN (GLUCOPHAGE-XR) 500 MG 24 hr tablet Take 500 mg by mouth 2 (two) times daily with a meal.     . metoprolol (LOPRESSOR) 50 MG tablet Take 50 mg by mouth 2 (two) times daily.    . nitroGLYCERIN (NITROSTAT) 0.4 MG SL tablet Place 1 tablet (0.4 mg total) under the tongue every 5 (five) minutes as needed for chest pain. 100 tablet PRN  . omeprazole (PRILOSEC) 20 MG capsule Take 20 mg by mouth daily.    . rosuvastatin (CRESTOR) 20 MG tablet Take 1 tablet (20 mg total) by mouth daily. 30 tablet 5   No current facility-administered medications for this visit.    Allergies:   Lipitor; Sulfa drugs cross reactors; and Codeine    Social History:  The patient  reports that he quit smoking about 14 years ago. He has never used smokeless tobacco. He reports that he does not drink alcohol or use illicit drugs.   Family History:  The patient's family history includes Diabetes in his brother, daughter, mother, sister, and sister; Heart attack in his father and mother; Heart disease in his daughter, father, and mother; Hypertension in his daughter, father, and mother.    ROS:  Please see the history of present illness.   Otherwise, review of systems are positive for none.   All other systems are reviewed and negative.    PHYSICAL EXAM: VS:  BP 130/70 mmHg  Pulse 85  Ht 5\' 10"  (1.778 m)  Wt 187 lb 1.9 oz (84.877 kg)  BMI 26.85 kg/m2 , BMI Body mass index is 26.85 kg/(m^2). GEN: Well nourished, well developed, in no acute distress HEENT: normal Neck: no JVD, there are soft lateral carotid bruits,  Cardiac: RRR; no murmurs, rubs, or gallops,no edema  Respiratory:  clear to auscultation bilaterally, normal work of breathing GI: soft, nontender, nondistended, + BS MS: no deformity or atrophy Skin: warm and dry, no rash Neuro:  Strength and sensation are intact Psych: euthymic mood, full affect   EKG:  EKG is ordered today. The ekg ordered today demonstrates normal sinus rhythm at 85 bpm.   Old anterolateral infarct.  Since prior tracing of 11/21/14, PVCs are absent   Recent Labs: 05/23/2015: ALT 18; BUN 16; Creatinine, Ser 1.02; Potassium 4.4; Sodium 136    Lipid Panel    Component Value Date/Time   CHOL 170 05/23/2015 0916   TRIG 195.0* 05/23/2015 0916   HDL 28.40* 05/23/2015 0916   CHOLHDL 6 05/23/2015 0916   VLDL 39.0 05/23/2015 0916   LDLCALC 103* 05/23/2015 0916      Wt Readings from Last 3 Encounters:  11/28/15 187 lb 1.9 oz (84.877 kg)  08/02/15 189 lb (  85.73 kg)  05/23/15 191 lb 12.8 oz (87 kg)        ASSESSMENT AND PLAN:  1. Ischemic heart disease status post CABG 2003 2. diabetes mellitus insulin-dependent followed by Dr. Tamala Julian at [facility] 3. hypertensive heart disease without heart failure.  Continue current medication 4. Hyperlipidemia.  He is on Crestor 20 mg daily.  Fasting lipid panel drawn today 5. peripheral arterial occlusive disease followed by Dr. Oneida Alar 6. carotid artery disease followed by Dr. Oneida Alar   Current medicines are reviewed at length with the patient today.  The patient does not have concerns regarding medicines.  The following changes have been made:  no change  Labs/ tests ordered today include:   Orders Placed This Encounter  Procedures  . Lipid panel  . Hepatic function panel  . Basic metabolic panel  . EKG 12-Lead    Disposition: Continue current medication.  Recheck in 6 months for office visit with Dr. Acie Fredrickson.  Berna Spare MD 11/28/2015 8:40 AM    [facility] [place], [place], [place]  [address] Phone: [phone]; Fax: [phone]

## 2015-11-29 NOTE — Progress Notes (Signed)
Quick Note:  Please report to patient. The recent labs are stable. Continue same medication and careful diet. ______ 

## 2015-12-04 ENCOUNTER — Other Ambulatory Visit: Payer: Self-pay | Admitting: Vascular Surgery

## 2016-01-28 DIAGNOSIS — IMO0002 Reserved for concepts with insufficient information to code with codable children: Secondary | ICD-10-CM | POA: Insufficient documentation

## 2016-01-28 DIAGNOSIS — I10 Essential (primary) hypertension: Secondary | ICD-10-CM | POA: Insufficient documentation

## 2016-01-28 DIAGNOSIS — E119 Type 2 diabetes mellitus without complications: Secondary | ICD-10-CM | POA: Insufficient documentation

## 2016-01-28 DIAGNOSIS — E1169 Type 2 diabetes mellitus with other specified complication: Secondary | ICD-10-CM | POA: Insufficient documentation

## 2016-02-05 DIAGNOSIS — K219 Gastro-esophageal reflux disease without esophagitis: Secondary | ICD-10-CM | POA: Insufficient documentation

## 2016-06-11 ENCOUNTER — Ambulatory Visit: Payer: Medicare HMO | Admitting: Cardiovascular Disease

## 2016-06-13 ENCOUNTER — Encounter: Payer: Self-pay | Admitting: Cardiovascular Disease

## 2016-06-13 ENCOUNTER — Ambulatory Visit (INDEPENDENT_AMBULATORY_CARE_PROVIDER_SITE_OTHER): Payer: Medicare HMO | Admitting: Cardiovascular Disease

## 2016-06-13 VITALS — BP 142/72 | HR 60 | Ht 70.0 in | Wt 184.0 lb

## 2016-06-13 DIAGNOSIS — E785 Hyperlipidemia, unspecified: Secondary | ICD-10-CM

## 2016-06-13 DIAGNOSIS — I10 Essential (primary) hypertension: Secondary | ICD-10-CM

## 2016-06-13 DIAGNOSIS — I25118 Atherosclerotic heart disease of native coronary artery with other forms of angina pectoris: Secondary | ICD-10-CM | POA: Diagnosis not present

## 2016-06-13 DIAGNOSIS — I2510 Atherosclerotic heart disease of native coronary artery without angina pectoris: Secondary | ICD-10-CM

## 2016-06-13 LAB — LIPID PANEL
CHOL/HDL RATIO: 3.2 ratio (ref <=5.0)
Cholesterol: 112 mg/dL — ABNORMAL LOW (ref 125–200)
HDL: 35 mg/dL — AB (ref >=40)
LDL CALC: 56 mg/dL (ref <130)
Triglycerides: 103 mg/dL (ref <150)
VLDL: 21 mg/dL (ref <30)

## 2016-06-13 LAB — COMPREHENSIVE METABOLIC PANEL
ALT: 14 U/L (ref 9–46)
AST: 14 U/L (ref 10–35)
Albumin: 3.9 g/dL (ref 3.6–5.1)
Alkaline Phosphatase: 77 U/L (ref 40–115)
BILIRUBIN TOTAL: 0.5 mg/dL (ref 0.2–1.2)
BUN: 24 mg/dL (ref 7–25)
CHLORIDE: 106 mmol/L (ref 98–110)
CO2: 20 mmol/L (ref 20–31)
CREATININE: 0.99 mg/dL (ref 0.70–1.25)
Calcium: 9.3 mg/dL (ref 8.6–10.3)
Glucose, Bld: 173 mg/dL — ABNORMAL HIGH (ref 65–99)
Potassium: 4.6 mmol/L (ref 3.5–5.3)
SODIUM: 136 mmol/L (ref 135–146)
Total Protein: 7.3 g/dL (ref 6.1–8.1)

## 2016-06-13 MED ORDER — ROSUVASTATIN CALCIUM 20 MG PO TABS: 20.0000 mg | ORAL_TABLET | Freq: Every day | ORAL | 3 refills | Status: DC

## 2016-06-13 NOTE — Patient Instructions (Signed)
Medication Instructions:  Your physician recommends that you continue on your current medications as directed. Please refer to the Current Medication list given to you today.   Labwork: TODAY - cholesterol, complete metabolic panel   Testing/Procedures: None Ordered   Follow-Up: Your physician wants you to follow-up in: 6 months with Dr. Nahser.  You will receive a reminder letter in the mail two months in advance. If you don't receive a letter, please call our office to schedule the follow-up appointment.   If you need a refill on your cardiac medications before your next appointment, please call your pharmacy.   Thank you for choosing [facility]! Aiesha Leland, RN [phone]    

## 2016-06-13 NOTE — Progress Notes (Signed)
Cardiology Office Note   Date:  06/13/2016   ID:  [name] 1945/11/20, MRN [id number]  PCP:  Connor Shire, MD  Cardiologist: previously Connor Coco MD , now Connor Moores, MD   Chief Complaint  Patient presents with  . Hypertension   Problem list 1. Coronary artery disease-status post CABG 2003 2. Hyperlipidemia 3. Carotid artery disease-status post left carotid stenting , s/p Right CEA  4. Essential hypertension 5. Diabetes mellitus     [name] is a 70 y.o. male who presents for a six-month follow-up visit  Notes from [name]: . He has a past history of coronary artery disease and had coronary artery bypass graft surgery in 2003. He has a remote history of a right carotid endarterectomy about 18 years ago. He had a left carotid endarterectomy by [name] in 2014. He has a history of high blood pressure, diabetes mellitus, and hyperlipidemia. He had a normal nuclear stress test in June 2012 prior to rotator cuff surgery. The patient developed claudication of his right leg  and underwent successful vascular surgery in February 2015. Since last visit he has been doing well with no chest pain or shortness of breath. Since last visit he has retired and has been enjoying retirement. He is no longer experiencing any right calf claudication. He has diabetes followed by [name] at [facility]. His most recent A1c was 7.2 Since last visit the patient has had no new cardiac symptoms.  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  He himself lives in a townhouse.  Aug. 18, 2017:  [name] is seen today for the first time. He's a previous patient of [name]. He is followed for coronary artery disease, hyperlipidemia, carotid artery disease, hypertension.  Doing well.   Stays active.   Does lots of woodworking . Walks often   No angina     Past Medical History:    Diagnosis Date  . Arthritis   . CAD (coronary artery disease)   . Carotid artery occlusion   . Diabetes mellitus    fasting 100-120s  . Dizziness   . Dysrhythmia    skips beats  . GERD (gastroesophageal reflux disease)   . History of kidney stones   . Hyperlipidemia   . Hypertension   . IHD (ischemic heart disease)    Prior CABG in 2003  . Normal nuclear stress test June 2012  . Peripheral vascular disease ([place])   . Torn rotator cuff     Past Surgical History:  Procedure Laterality Date  . ABDOMINAL AORTAGRAM N/A 12/21/2013   Procedure: ABDOMINAL Maxcine Ham;  Surgeon: Elam Dutch, MD;  Location: [facility] CATH LAB;  Service: Cardiovascular;  Laterality: N/A;  . APPENDECTOMY    . ARCH AORTOGRAM  08/26/2013   Procedure: ARCH AORTOGRAM;  Surgeon: Elam Dutch, MD;  Location: [facility] CATH LAB;  Service: Cardiovascular;;  . CARDIAC CATHETERIZATION  05/03/2002   EF 40-45%  . CARDIOVASCULAR STRESS TEST  08/10/2006   EF 65%  . CAROTID ENDARTERECTOMY    . CAROTID STENT INSERTION Left 08/26/2013   Procedure: CAROTID STENT INSERTION;  Surgeon: Elam Dutch, MD;  Location: [facility] CATH LAB;  Service: Cardiovascular;  Laterality: Left;  internal carotid  . CORONARY ARTERY BYPASS GRAFT  04/2002  . DOPPLER ECHOCARDIOGRAPHY  08/12/2002   EF 80-85%  . ENDARTERECTOMY Left 07/20/2013   Procedure: ATTEMPTED ENDARTERECTOMY  CAROTID;  Surgeon: Conrad Bremerton, MD;  Location: [place];  Service: Vascular;  Laterality: Left;  . HERNIA REPAIR    . SHOULDER SURGERY       Current Outpatient Prescriptions  Medication Sig Dispense Refill  . amLODipine-benazepril (LOTREL) 5-20 MG per capsule Take 1 capsule by mouth daily.     Marland Kitchen aspirin EC 81 MG tablet Take 81 mg by mouth daily.    . clopidogrel (PLAVIX) 75 MG tablet TAKE ONE TABLET BY MOUTH ONCE DAILY 90 tablet 3  . hydrochlorothiazide (HYDRODIURIL) 12.5 MG tablet Take 12.5 mg by mouth daily.     . Insulin Glargine (LANTUS SOLOSTAR) 100 UNIT/ML SOPN Inject 44  Units into the skin at bedtime.    . insulin lispro (HUMALOG) 100 UNIT/ML KiwkPen Inject 15 Units into the skin 3 (three) times daily. If blood glucose is between 150-200 inject an additional 1 unit into the skin. If blood glucose is between 201-250 inject an additional 2 units into the skin once. If blood glucose is between 251-300 inject an additional 3 units into the skin once. If blood glucose is higher than 300, inject an additional 4 units into the skin once.    . metFORMIN (GLUCOPHAGE-XR) 500 MG 24 hr tablet Take 500 mg by mouth 2 (two) times daily with a meal.     . metoprolol (LOPRESSOR) 50 MG tablet Take 50 mg by mouth 2 (two) times daily.    . nitroGLYCERIN (NITROSTAT) 0.4 MG SL tablet Place 1 tablet (0.4 mg total) under the tongue every 5 (five) minutes as needed for chest pain. 100 tablet PRN  . omeprazole (PRILOSEC) 20 MG capsule Take 20 mg by mouth daily.    . rosuvastatin (CRESTOR) 20 MG tablet Take 1 tablet (20 mg total) by mouth daily. 30 tablet 5   No current facility-administered medications for this visit.     Allergies:   Lipitor [atorvastatin calcium]; Sulfa drugs cross reactors; and Codeine    Social History:  The patient  reports that he quit smoking about 14 years ago. He has never used smokeless tobacco. He reports that he does not drink alcohol or use drugs.   Family History:  The patient's family history includes Diabetes in his brother, daughter, mother, sister, and sister; Heart attack in his father and mother; Heart disease in his daughter, father, and mother; Hypertension in his daughter, father, and mother.    ROS:  Please see the history of present illness.   Otherwise, review of systems are positive for none.   All other systems are reviewed and negative.    PHYSICAL EXAM: VS:  BP (!) 142/72 (BP Location: Left Arm, Patient Position: Sitting, Cuff Size: Normal)   Pulse 60   Ht 5\' 10"  (1.778 m)   Wt 184 lb (83.5 kg)   BMI 26.40 kg/m  , BMI Body mass index  is 26.4 kg/m. GEN: Well nourished, well developed, in no acute distress  HEENT: normal  Neck: no JVD, there are soft lateral carotid bruits,  Cardiac: RRR; no murmurs, rubs, or gallops,no edema  Respiratory:  clear to auscultation bilaterally, normal work of breathing GI: soft, nontender, nondistended, + BS MS: no deformity or atrophy  Skin: warm and dry, no rash Neuro:  Strength and sensation are intact Psych: euthymic mood, full affect   EKG:  EKG is ordered today. The ekg ordered today demonstrates normal sinus rhythm at 85 bpm.  Old anterolateral infarct.  Since prior tracing of 11/21/14, PVCs are absent  Recent Labs: 11/28/2015: ALT 19; BUN 16; Creat 1.03; Potassium 4.0; Sodium 139    Lipid Panel    Component Value Date/Time   CHOL 103 (L) 11/28/2015 0843   TRIG 118 11/28/2015 0843   HDL 26 (L) 11/28/2015 0843   CHOLHDL 4.0 11/28/2015 0843   VLDL 24 11/28/2015 0843   LDLCALC 53 11/28/2015 0843      Wt Readings from Last 3 Encounters:  06/13/16 184 lb (83.5 kg)  11/28/15 187 lb 1.9 oz (84.9 kg)  08/02/15 189 lb (85.7 kg)        ASSESSMENT AND PLAN:  1.  CAD :  status post CABG 2003  2. diabetes mellitus insulin-dependent followed by [name] at [facility]  3. hypertensive heart disease without heart failure.  Continue current medication  4. Hyperlipidemia.  He is on Crestor 20 mg daily.   He complains of the cost but had severe muscle aches with Atorvastatin .   Gave him a written script and he will shop around  Fasting lipid panel drawn today  6. Bilateral carotid artery disease:   followed by [name]   Current medicines are reviewed at length with the patient today.  The patient does not have concerns regarding medicines.  The following changes have been made:  no change  Labs/ tests ordered today include:   No orders of the defined types were placed in this encounter.   Disposition: Continue current medication.  Recheck in 6  months for office visit with Dr. Acie Fredrickson.  Berna Spare MD 06/13/2016 8:58 AM    [facility], [place], [place]  [address] Phone: [phone]; Fax: [phone]

## 2016-08-11 ENCOUNTER — Encounter: Payer: Self-pay | Admitting: Vascular Surgery

## 2016-08-14 ENCOUNTER — Ambulatory Visit (HOSPITAL_COMMUNITY)
Admission: RE | Admit: 2016-08-14 | Discharge: 2016-08-14 | Disposition: A | Discharge status: Home / Self Care | Payer: Medicare HMO | Source: Ambulatory Visit | Attending: Vascular Surgery | Admitting: Vascular Surgery

## 2016-08-14 ENCOUNTER — Ambulatory Visit (INDEPENDENT_AMBULATORY_CARE_PROVIDER_SITE_OTHER)
Admission: RE | Admit: 2016-08-14 | Discharge: 2016-08-14 | Disposition: A | Discharge status: Home / Self Care | Payer: Medicare HMO | Source: Ambulatory Visit | Attending: Vascular Surgery | Admitting: Vascular Surgery

## 2016-08-14 ENCOUNTER — Encounter: Payer: Self-pay | Admitting: Vascular Surgery

## 2016-08-14 ENCOUNTER — Ambulatory Visit (INDEPENDENT_AMBULATORY_CARE_PROVIDER_SITE_OTHER): Payer: Medicare HMO | Admitting: Vascular Surgery

## 2016-08-14 VITALS — BP 190/79 | HR 61 | Temp 98.2°F | Resp 18 | Ht 70.0 in | Wt 189.0 lb

## 2016-08-14 DIAGNOSIS — I739 Peripheral vascular disease, unspecified: Secondary | ICD-10-CM

## 2016-08-14 DIAGNOSIS — T82856A Stenosis of peripheral vascular stent, initial encounter: Secondary | ICD-10-CM | POA: Insufficient documentation

## 2016-08-14 DIAGNOSIS — I6523 Occlusion and stenosis of bilateral carotid arteries: Secondary | ICD-10-CM | POA: Diagnosis not present

## 2016-08-14 DIAGNOSIS — I10 Essential (primary) hypertension: Secondary | ICD-10-CM | POA: Insufficient documentation

## 2016-08-14 DIAGNOSIS — E1151 Type 2 diabetes mellitus with diabetic peripheral angiopathy without gangrene: Secondary | ICD-10-CM | POA: Diagnosis not present

## 2016-08-14 DIAGNOSIS — Y838 Other surgical procedures as the cause of abnormal reaction of the patient, or of later complication, without mention of misadventure at the time of the procedure: Secondary | ICD-10-CM | POA: Insufficient documentation

## 2016-08-14 DIAGNOSIS — Z87891 Personal history of nicotine dependence: Secondary | ICD-10-CM | POA: Insufficient documentation

## 2016-08-14 DIAGNOSIS — E785 Hyperlipidemia, unspecified: Secondary | ICD-10-CM | POA: Insufficient documentation

## 2016-08-14 DIAGNOSIS — R0989 Other specified symptoms and signs involving the circulatory and respiratory systems: Secondary | ICD-10-CM | POA: Diagnosis present

## 2016-08-14 DIAGNOSIS — I779 Disorder of arteries and arterioles, unspecified: Secondary | ICD-10-CM | POA: Diagnosis not present

## 2016-08-14 DIAGNOSIS — R938 Abnormal findings on diagnostic imaging of other specified body structures: Secondary | ICD-10-CM | POA: Insufficient documentation

## 2016-08-14 LAB — VAS US CAROTID
LCCAPDIAS: 15 cm/s
LCCAPSYS: 74 cm/s
Left CCA dist dias: 25 cm/s
Left CCA dist sys: 83 cm/s
RCCADSYS: -130 cm/s
RCCAPDIAS: 16 cm/s
RCCAPSYS: 182 cm/s
RIGHT CCA MID DIAS: 17 cm/s
RIGHT ECA DIAS: -13 cm/s
RIGHT VERTEBRAL DIAS: 14 cm/s

## 2016-08-14 NOTE — Progress Notes (Signed)
Vascular and Vein Specialist of [facility]  Patient name: [name] MRN: [id number] DOB: [birth year] Sex: male  REASON FOR VISIT: follow-up PAD and carotid disease  HPI: [name] is a 70 y.o. male who presents for continued follow-up of his PAD and bilateral carotid stenosis. He previously underwent right superficial femoral artery stent in February 2015. He had a left carotid stent in 2014 as his disease was felt to be surgically inaccessible intraoperatively. He had a right carotid endarterectomy in 2007.   He denies any amaurosis fugax, sudden onset weakness or numbness of extremities, expressive aphasia. He denies any claudication, however his ambulation is limited by his hip pain. He states that his hips have been hurting him for years. He has been active and keeping busy with remodeling the kitchen. He is not smoking. He denies any chest pain or shortness of breath.  He is on aspirin, Plavix and a statin. He is on insulin and metformin for diabetes.  Past Medical History:  Diagnosis Date  . Arthritis   . CAD (coronary artery disease)   . Carotid artery occlusion   . Diabetes mellitus    fasting 100-120s  . Dizziness   . Dysrhythmia    skips beats  . GERD (gastroesophageal reflux disease)   . History of kidney stones   . Hyperlipidemia   . Hypertension   . IHD (ischemic heart disease)    Prior CABG in 2003  . Normal nuclear stress test June 2012  . Peripheral vascular disease ([place])   . Torn rotator cuff     Family History  Problem Relation Age of Onset  . Heart attack Mother   . Diabetes Mother   . Heart disease Mother   . Hypertension Mother   . Heart attack Father   . Heart disease Father   . Hypertension Father   . Diabetes Brother   . Diabetes Sister   . Diabetes Daughter   . Heart disease Daughter   . Hypertension Daughter   . Diabetes Sister     SOCIAL HISTORY: Social History  Substance Use Topics  . Smoking status: Former Smoker    Quit  date: 10/27/2001  . Smokeless tobacco: Never Used  . Alcohol use No    Allergies  Allergen Reactions  . Lipitor [Atorvastatin Calcium] Other (See Comments)    Muscle ache  . Sulfa Drugs Cross Reactors Other (See Comments)    "raw spots"  . Codeine Rash    Current Outpatient Prescriptions  Medication Sig Dispense Refill  . amLODipine-benazepril (LOTREL) 5-20 MG per capsule Take 1 capsule by mouth daily.     Marland Kitchen aspirin EC 81 MG tablet Take 81 mg by mouth daily.    . clopidogrel (PLAVIX) 75 MG tablet TAKE ONE TABLET BY MOUTH ONCE DAILY 90 tablet 3  . hydrochlorothiazide (HYDRODIURIL) 12.5 MG tablet Take 12.5 mg by mouth daily.     . Insulin Glargine (LANTUS SOLOSTAR) 100 UNIT/ML SOPN Inject 44 Units into the skin at bedtime.    . insulin lispro (HUMALOG) 100 UNIT/ML KiwkPen Inject 15 Units into the skin 3 (three) times daily. If blood glucose is between 150-200 inject an additional 1 unit into the skin. If blood glucose is between 201-250 inject an additional 2 units into the skin once. If blood glucose is between 251-300 inject an additional 3 units into the skin once. If blood glucose is higher than 300, inject an additional 4 units into the skin once.    Marland Kitchen  metFORMIN (GLUCOPHAGE-XR) 500 MG 24 hr tablet Take 500 mg by mouth 2 (two) times daily with a meal.     . metoprolol (LOPRESSOR) 50 MG tablet Take 50 mg by mouth 2 (two) times daily.    . nitroGLYCERIN (NITROSTAT) 0.4 MG SL tablet Place 1 tablet (0.4 mg total) under the tongue every 5 (five) minutes as needed for chest pain. 100 tablet PRN  . omeprazole (PRILOSEC) 20 MG capsule Take 20 mg by mouth daily.    . rosuvastatin (CRESTOR) 20 MG tablet Take 1 tablet (20 mg total) by mouth daily. 90 tablet 3   No current facility-administered medications for this visit.     REVIEW OF SYSTEMS:  [X]  denotes positive finding, [ ]  denotes negative finding Cardiac  Comments:  Chest pain or chest pressure:    Shortness of breath upon exertion:      Short of breath when lying flat:    Irregular heart rhythm:        Vascular    Pain in calf, thigh, or hip brought on by ambulation: x Hip pain  Pain in feet at night that wakes you up from your sleep:     Blood clot in your veins:    Leg swelling:         Pulmonary    Oxygen at home:    Productive cough:     Wheezing:         Neurologic    Sudden weakness in arms or legs:     Sudden numbness in arms or legs:     Sudden onset of difficulty speaking or slurred speech:    Temporary loss of vision in one eye:     Problems with dizziness:         Gastrointestinal    Blood in stool:     Vomited blood:         Genitourinary    Burning when urinating:     Blood in urine:        Psychiatric    Major depression:         Hematologic    Bleeding problems:    Problems with blood clotting too easily:        Skin    Rashes or ulcers:        Constitutional    Fever or chills:      PHYSICAL EXAM: Vitals:   08/14/16 1437 08/14/16 1438  BP: (!) 190/78 (!) 190/79  Pulse: 61   Resp: 18   Temp: 98.2 F (36.8 C)   TempSrc: Oral   SpO2: 96%   Weight: 189 lb (85.7 kg)   Height: 5\' 10"  (1.778 m)     GENERAL: The patient is a well-nourished male, in no acute distress. The vital signs are documented above. CARDIAC: There is a regular rate and rhythm. Bilateral carotid bruits. 2/6 systolic murmur. VASCULAR: 2+ femoral pulses bilaterally. Nonpalpable pedal pulses. PULMONARY: There is good air exchange bilaterally without wheezing or rales. MUSCULOSKELETAL: There are no major deformities or cyanosis. NEUROLOGIC: No focal weakness or paresthesias are detected. SKIN: There are no ulcers or rashes noted. PSYCHIATRIC: The patient has a normal affect.  DATA:  Carotid duplex 08/14/2016  Right carotid endarterectomy site is patent.  Left mid stent velocity of 361/81 centimeters per second. Previously 286/88 cm/s on 07/19/2015. On 01/11/2015, 281/91 cm/s.  ABIs and lower  extremity arterial duplex 08/14/2016  Right: 0.80, previously 0.88 on 07/19/2015 Left: 0.75, previously 0.80 on 07/19/2015  Elevated  velocities of 400 cm/s at proximal right SFA stent.  MEDICAL ISSUES:  Bilateral carotid stenosis status post right carotid endarterectomy, left internal carotid artery stent  The patient denies any TIA or stroke symptoms. There has been an increase in the left mid stent velocities. There have been fluctuations in these velocities since the stent was originally placed back in 2014. Obtain carotid duplex in 6 months to evaluate. If his velocities continue to rise, he will need a carotid angiogram with possible intervention.  Peripheral arterial disease status post right SFA stent  His lower extremity arterial duplex reveals increased velocities at the proximal right SFA stent. His right lower extremity ABIs have decreased almost 10% since his last study back in September 2016. He denies any changes in his legs however. His ambulation is mostly limited by his hip pain which is chronic. He will follow-up in 6 months with ABIs and lower extremity duplex. Discussed that if he developed claudication symptoms, that he will need a lower extremity arteriogram with possible intervention. He knows to contact the office sooner if he develops any new leg symptoms.  Patient is on maximal medical management with aspirin, Plavix and a statin. He is not smoking.  Virgina Jock, PA-C [facility] [place]  History and exam findings as above. The patient is currently asymptomatic from his peripheral arterial disease and his carotid occlusive disease. His velocities have varied up and down with his carotid stenting ever since placement. We will need to continue to keep an eye on this. If the velocities continue to increase on the next exam we will consider an arteriogram. Similar findings concerning his right SFA stent. The patient will also return sooner if he  develops any symptoms of TIA amaurosis stroke or recurrent claudication symptoms.  Ruta Hinds, MD [facility] [place] Office: [phone] Pager: [phone]

## 2016-09-09 NOTE — Addendum Note (Signed)
Addended by: Thresa Ross C on: 09/09/2016 01:12 PM   Modules accepted: Orders

## 2016-10-02 ENCOUNTER — Telehealth: Payer: Self-pay | Admitting: Cardiovascular Disease

## 2016-10-02 NOTE — Telephone Encounter (Signed)
New Message  Pt voiced he has to fast for appt but there's no order.

## 2016-10-02 NOTE — Telephone Encounter (Signed)
Left detailed message that patient can come fasting to February appointment with Dr. Acie Fredrickson and appropriate lab work will be ordered after appointment.  I advised him to call back with questions or concerns.

## 2016-11-10 DIAGNOSIS — Z7901 Long term (current) use of anticoagulants: Secondary | ICD-10-CM | POA: Diagnosis not present

## 2016-11-10 DIAGNOSIS — L602 Onychogryphosis: Secondary | ICD-10-CM | POA: Diagnosis not present

## 2016-11-10 DIAGNOSIS — E1151 Type 2 diabetes mellitus with diabetic peripheral angiopathy without gangrene: Secondary | ICD-10-CM | POA: Diagnosis not present

## 2016-11-10 DIAGNOSIS — B351 Tinea unguium: Secondary | ICD-10-CM | POA: Diagnosis not present

## 2016-12-04 ENCOUNTER — Encounter: Payer: Self-pay | Admitting: Cardiovascular Disease

## 2016-12-10 ENCOUNTER — Other Ambulatory Visit: Payer: Self-pay | Admitting: Vascular Surgery

## 2016-12-10 ENCOUNTER — Encounter (INDEPENDENT_AMBULATORY_CARE_PROVIDER_SITE_OTHER): Payer: Self-pay

## 2016-12-10 ENCOUNTER — Encounter: Payer: Self-pay | Admitting: Cardiovascular Disease

## 2016-12-10 ENCOUNTER — Ambulatory Visit (INDEPENDENT_AMBULATORY_CARE_PROVIDER_SITE_OTHER): Payer: Medicare HMO | Admitting: Cardiovascular Disease

## 2016-12-10 VITALS — BP 158/82 | HR 57 | Ht 70.0 in | Wt 193.0 lb

## 2016-12-10 DIAGNOSIS — I10 Essential (primary) hypertension: Secondary | ICD-10-CM

## 2016-12-10 DIAGNOSIS — I2510 Atherosclerotic heart disease of native coronary artery without angina pectoris: Secondary | ICD-10-CM | POA: Diagnosis not present

## 2016-12-10 DIAGNOSIS — E782 Mixed hyperlipidemia: Secondary | ICD-10-CM | POA: Diagnosis not present

## 2016-12-10 LAB — COMPREHENSIVE METABOLIC PANEL
ALK PHOS: 86 IU/L (ref 39–117)
ALT: 22 IU/L (ref 0–44)
AST: 22 IU/L (ref 0–40)
Albumin/Globulin Ratio: 1.5 (ref 1.2–2.2)
Albumin: 4.2 g/dL (ref 3.6–4.8)
BUN/Creatinine Ratio: 10 (ref 10–24)
BUN: 10 mg/dL (ref 8–27)
Bilirubin Total: 0.3 mg/dL (ref 0.0–1.2)
CALCIUM: 9.5 mg/dL (ref 8.6–10.2)
CO2: 20 mmol/L (ref 18–29)
Chloride: 98 mmol/L (ref 96–106)
Creatinine, Ser: 0.97 mg/dL (ref 0.76–1.27)
GFR calc Af Amer: 92 mL/min/{1.73_m2} (ref >59)
GFR calc non Af Amer: 79 mL/min/{1.73_m2} (ref >59)
GLUCOSE: 152 mg/dL — AB (ref 65–99)
Globulin, Total: 2.8 g/dL (ref 1.5–4.5)
Potassium: 4.7 mmol/L (ref 3.5–5.2)
Sodium: 139 mmol/L (ref 134–144)
Total Protein: 7 g/dL (ref 6.0–8.5)

## 2016-12-10 LAB — LIPID PANEL
CHOLESTEROL TOTAL: 113 mg/dL (ref 100–199)
Chol/HDL Ratio: 3.5 ratio units (ref 0.0–5.0)
HDL: 32 mg/dL — ABNORMAL LOW (ref >39)
LDL CALC: 59 mg/dL (ref 0–99)
Triglycerides: 112 mg/dL (ref 0–149)
VLDL CHOLESTEROL CAL: 22 mg/dL (ref 5–40)

## 2016-12-10 NOTE — Patient Instructions (Signed)
Medication Instructions:  Your physician recommends that you continue on your current medications as directed. Please refer to the Current Medication list given to you today.   Labwork: TODAY - cholesterol, complete metabolic panel   Testing/Procedures: None Ordered   Follow-Up: Your physician wants you to follow-up in: 6 months with Dr. Nahser.  You will receive a reminder letter in the mail two months in advance. If you don't receive a letter, please call our office to schedule the follow-up appointment.   If you need a refill on your cardiac medications before your next appointment, please call your pharmacy.   Thank you for choosing [facility]! Jarelyn Bambach, RN [phone]    

## 2016-12-10 NOTE — Progress Notes (Signed)
Cardiology Office Note   Date:  12/10/2016   ID:  [name], [name] 07/24/1946, MRN [id number]  PCP:  Stephens Shire, MD  Cardiologist: previously Darlin Coco MD , now Mertie Moores, MD   Chief Complaint  Patient presents with  . Follow-up    PAD , HTN   Problem list 1. Coronary artery disease-status post CABG 2003 2. Hyperlipidemia 3. Carotid artery disease-status post left carotid stenting , s/p Right CEA  4. Essential hypertension 5. Diabetes mellitus     [name] is a 71 y.o. male who presents for a six-month follow-up visit  Notes from Dr. Mare Ferrari: . He has a past history of coronary artery disease and had coronary artery bypass graft surgery in 2003. He has a remote history of a right carotid endarterectomy about 18 years ago. He had a left carotid endarterectomy by Dr. Oneida Alar in 2014. He has a history of high blood pressure, diabetes mellitus, and hyperlipidemia. He had a normal nuclear stress test in June 2012 prior to rotator cuff surgery. The patient developed claudication of his right leg  and underwent successful vascular surgery in February 2015. Since last visit he has been doing well with no chest pain or shortness of breath. Since last visit he has retired and has been enjoying retirement. He is no longer experiencing any right calf claudication. He has diabetes followed by Dr. Tamala Julian at [facility]. His most recent A1c was 7.2 Since last visit the patient has had no new cardiac symptoms.  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  He himself lives in a townhouse.  Aug. 18, 2017:  [name] is seen today for the first time. He's a previous patient of Dr. Mare Ferrari. He is followed for coronary artery disease, hyperlipidemia, carotid artery disease, hypertension.  Doing well.   Stays active.   Does lots of woodworking . Walks often   No angina   Feb. 14, 2018; Doing  well.  No CP or dyspnea. Not exercising much ,  Avoids salt.      Past Medical History:  Diagnosis Date  . Arthritis   . CAD (coronary artery disease)   . Carotid artery occlusion   . Diabetes mellitus    fasting 100-120s  . Dizziness   . Dysrhythmia    skips beats  . GERD (gastroesophageal reflux disease)   . History of kidney stones   . Hyperlipidemia   . Hypertension   . IHD (ischemic heart disease)    Prior CABG in 2003  . Normal nuclear stress test June 2012  . Peripheral vascular disease ([place])   . Torn rotator cuff     Past Surgical History:  Procedure Laterality Date  . ABDOMINAL AORTAGRAM N/A 12/21/2013   Procedure: ABDOMINAL Maxcine Ham;  Surgeon: Elam Dutch, MD;  Location: [facility] CATH LAB;  Service: Cardiovascular;  Laterality: N/A;  . APPENDECTOMY    . ARCH AORTOGRAM  08/26/2013   Procedure: ARCH AORTOGRAM;  Surgeon: Elam Dutch, MD;  Location: [facility] CATH LAB;  Service: Cardiovascular;;  . CARDIAC CATHETERIZATION  05/03/2002   EF 40-45%  . CARDIOVASCULAR STRESS TEST  08/10/2006   EF 65%  . CAROTID ENDARTERECTOMY    . CAROTID STENT INSERTION Left 08/26/2013   Procedure: CAROTID STENT INSERTION;  Surgeon: Elam Dutch, MD;  Location: [facility] CATH LAB;  Service: Cardiovascular;  Laterality: Left;  internal carotid  . CORONARY  ARTERY BYPASS GRAFT  04/2002  . DOPPLER ECHOCARDIOGRAPHY  08/12/2002   EF 80-85%  . ENDARTERECTOMY Left 07/20/2013   Procedure: ATTEMPTED ENDARTERECTOMY CAROTID;  Surgeon: Conrad Maitland, MD;  Location: [place] [facility];  Service: Vascular;  Laterality: Left;  . HERNIA REPAIR    . SHOULDER SURGERY       Current Outpatient Prescriptions  Medication Sig Dispense Refill  . amLODipine-benazepril (LOTREL) 5-20 MG per capsule Take 1 capsule by mouth daily.     Marland Kitchen aspirin EC 81 MG tablet Take 81 mg by mouth daily.    . clopidogrel (PLAVIX) 75 MG tablet TAKE ONE TABLET BY MOUTH ONCE DAILY 90 tablet 3  . hydrochlorothiazide (HYDRODIURIL) 12.5 MG tablet  Take 12.5 mg by mouth daily.     . Insulin Glargine (LANTUS SOLOSTAR) 100 UNIT/ML SOPN Inject 44 Units into the skin at bedtime.    . insulin lispro (HUMALOG) 100 UNIT/ML KiwkPen Inject 15 Units into the skin 3 (three) times daily. If blood glucose is between 150-200 inject an additional 1 unit into the skin. If blood glucose is between 201-250 inject an additional 2 units into the skin once. If blood glucose is between 251-300 inject an additional 3 units into the skin once. If blood glucose is higher than 300, inject an additional 4 units into the skin once.    . metFORMIN (GLUCOPHAGE-XR) 500 MG 24 hr tablet Take 500 mg by mouth 2 (two) times daily with a meal.     . metoprolol (LOPRESSOR) 50 MG tablet Take 50 mg by mouth 2 (two) times daily.    . nitroGLYCERIN (NITROSTAT) 0.4 MG SL tablet Place 1 tablet (0.4 mg total) under the tongue every 5 (five) minutes as needed for chest pain. 100 tablet PRN  . omeprazole (PRILOSEC) 20 MG capsule Take 20 mg by mouth daily.    . rosuvastatin (CRESTOR) 20 MG tablet Take 1 tablet (20 mg total) by mouth daily. 90 tablet 3   No current facility-administered medications for this visit.     Allergies:   Lipitor [atorvastatin calcium]; Sulfa drugs cross reactors; and Codeine    Social History:  The patient  reports that he quit smoking about 15 years ago. He has never used smokeless tobacco. He reports that he does not drink alcohol or use drugs.   Family History:  The patient's family history includes Diabetes in his brother, daughter, mother, sister, and sister; Heart attack in his father and mother; Heart disease in his daughter, father, and mother; Hypertension in his daughter, father, and mother.    ROS:  Please see the history of present illness.   Otherwise, review of systems are positive for none.   All other systems are reviewed and negative.    PHYSICAL EXAM: VS:  BP (!) 158/82 (BP Location: Left Arm, Patient Position: Sitting, Cuff Size: Normal)    Pulse (!) 57   Ht 5\' 10"  (1.778 m)   Wt 193 lb (87.5 kg)   SpO2 97%   BMI 27.69 kg/m  , BMI Body mass index is 27.69 kg/m. GEN: Well nourished, well developed, in no acute distress  HEENT: normal  Neck: no JVD, there are soft lateral carotid bruits,  Cardiac: RRR; no murmurs, rubs, or gallops,no edema  Respiratory:  clear to auscultation bilaterally, normal work of breathing GI: soft, nontender, nondistended, + BS MS: no deformity or atrophy  Skin: warm and dry, no rash Neuro:  Strength and sensation are intact Psych: euthymic mood, full affect  EKG:  EKG is ordered today. The ekg ordered today demonstrates sinus brady at 57.   Old ANt. MI    Recent Labs: 06/13/2016: ALT 14; BUN 24; Creat 0.99; Potassium 4.6; Sodium 136    Lipid Panel    Component Value Date/Time   CHOL 112 (L) 06/13/2016 0918   TRIG 103 06/13/2016 0918   HDL 35 (L) 06/13/2016 0918   CHOLHDL 3.2 06/13/2016 0918   VLDL 21 06/13/2016 0918   LDLCALC 56 06/13/2016 0918      Wt Readings from Last 3 Encounters:  12/10/16 193 lb (87.5 kg)  08/14/16 189 lb (85.7 kg)  06/13/16 184 lb (83.5 kg)        ASSESSMENT AND PLAN:  1.  CAD :  status post CABG 2003  2. diabetes mellitus insulin-dependent followed by Dr. Tamala Julian at [facility]  3. hypertensive heart disease without heart failure.  Continue current medication.  He eats lots of hot dogs. We discussed increasing his HCTZ to 25 mg a day but he did not want to do that. We'll discuss that again when I see him in 6 months.  4. Hyperlipidemia.   Fasting lipid panel drawn today  6. Bilateral carotid artery disease:   followed by Dr. Oneida Alar   Current medicines are reviewed at length with the patient today.  The patient does not have concerns regarding medicines.  The following changes have been made:  no change  Labs/ tests ordered today include:   No orders of the defined types were placed in this encounter.     Mertie Moores,  MD  12/10/2016 11:37 AM    [address],  [address], [place]  [id number] Pager [phone] Phone: [phone]; Fax: [phone]

## 2016-12-25 DIAGNOSIS — Z7902 Long term (current) use of antithrombotics/antiplatelets: Secondary | ICD-10-CM | POA: Diagnosis not present

## 2016-12-25 DIAGNOSIS — E1141 Type 2 diabetes mellitus with diabetic mononeuropathy: Secondary | ICD-10-CM | POA: Diagnosis not present

## 2016-12-25 DIAGNOSIS — I10 Essential (primary) hypertension: Secondary | ICD-10-CM | POA: Diagnosis not present

## 2016-12-25 DIAGNOSIS — K219 Gastro-esophageal reflux disease without esophagitis: Secondary | ICD-10-CM | POA: Diagnosis not present

## 2016-12-25 DIAGNOSIS — H259 Unspecified age-related cataract: Secondary | ICD-10-CM | POA: Diagnosis not present

## 2016-12-25 DIAGNOSIS — I209 Angina pectoris, unspecified: Secondary | ICD-10-CM | POA: Diagnosis not present

## 2016-12-25 DIAGNOSIS — H9193 Unspecified hearing loss, bilateral: Secondary | ICD-10-CM | POA: Diagnosis not present

## 2016-12-25 DIAGNOSIS — Z0000 Encounter for general adult medical examination without abnormal findings: Secondary | ICD-10-CM | POA: Diagnosis not present

## 2016-12-25 DIAGNOSIS — Z7982 Long term (current) use of aspirin: Secondary | ICD-10-CM | POA: Diagnosis not present

## 2016-12-25 DIAGNOSIS — E1136 Type 2 diabetes mellitus with diabetic cataract: Secondary | ICD-10-CM | POA: Diagnosis not present

## 2016-12-25 DIAGNOSIS — Z972 Presence of dental prosthetic device (complete) (partial): Secondary | ICD-10-CM | POA: Diagnosis not present

## 2016-12-25 DIAGNOSIS — E785 Hyperlipidemia, unspecified: Secondary | ICD-10-CM | POA: Diagnosis not present

## 2016-12-25 DIAGNOSIS — Z794 Long term (current) use of insulin: Secondary | ICD-10-CM | POA: Diagnosis not present

## 2016-12-25 DIAGNOSIS — E663 Overweight: Secondary | ICD-10-CM | POA: Diagnosis not present

## 2016-12-25 DIAGNOSIS — Z87891 Personal history of nicotine dependence: Secondary | ICD-10-CM | POA: Diagnosis not present

## 2017-02-02 ENCOUNTER — Encounter: Payer: Self-pay | Admitting: Vascular Surgery

## 2017-02-09 DIAGNOSIS — Z7901 Long term (current) use of anticoagulants: Secondary | ICD-10-CM | POA: Diagnosis not present

## 2017-02-09 DIAGNOSIS — L602 Onychogryphosis: Secondary | ICD-10-CM | POA: Diagnosis not present

## 2017-02-09 DIAGNOSIS — B351 Tinea unguium: Secondary | ICD-10-CM | POA: Diagnosis not present

## 2017-02-09 DIAGNOSIS — E1151 Type 2 diabetes mellitus with diabetic peripheral angiopathy without gangrene: Secondary | ICD-10-CM | POA: Diagnosis not present

## 2017-02-12 ENCOUNTER — Ambulatory Visit (HOSPITAL_COMMUNITY)
Admission: RE | Admit: 2017-02-12 | Discharge: 2017-02-12 | Disposition: A | Discharge status: Home / Self Care | Payer: Medicare HMO | Source: Ambulatory Visit | Attending: Vascular Surgery | Admitting: Vascular Surgery

## 2017-02-12 ENCOUNTER — Encounter: Payer: Self-pay | Admitting: Vascular Surgery

## 2017-02-12 ENCOUNTER — Ambulatory Visit (INDEPENDENT_AMBULATORY_CARE_PROVIDER_SITE_OTHER): Payer: Medicare HMO | Admitting: Vascular Surgery

## 2017-02-12 ENCOUNTER — Ambulatory Visit (INDEPENDENT_AMBULATORY_CARE_PROVIDER_SITE_OTHER)
Admission: RE | Admit: 2017-02-12 | Discharge: 2017-02-12 | Disposition: A | Discharge status: Home / Self Care | Payer: Medicare HMO | Source: Ambulatory Visit | Attending: Vascular Surgery | Admitting: Vascular Surgery

## 2017-02-12 VITALS — BP 183/82 | HR 63 | Temp 98.7°F | Resp 16 | Ht 70.0 in | Wt 188.0 lb

## 2017-02-12 DIAGNOSIS — Z95820 Peripheral vascular angioplasty status with implants and grafts: Secondary | ICD-10-CM | POA: Insufficient documentation

## 2017-02-12 DIAGNOSIS — I6523 Occlusion and stenosis of bilateral carotid arteries: Secondary | ICD-10-CM | POA: Diagnosis not present

## 2017-02-12 DIAGNOSIS — I779 Disorder of arteries and arterioles, unspecified: Secondary | ICD-10-CM

## 2017-02-12 DIAGNOSIS — E119 Type 2 diabetes mellitus without complications: Secondary | ICD-10-CM | POA: Insufficient documentation

## 2017-02-12 DIAGNOSIS — Z87891 Personal history of nicotine dependence: Secondary | ICD-10-CM | POA: Insufficient documentation

## 2017-02-12 DIAGNOSIS — I10 Essential (primary) hypertension: Secondary | ICD-10-CM | POA: Insufficient documentation

## 2017-02-12 DIAGNOSIS — I739 Peripheral vascular disease, unspecified: Secondary | ICD-10-CM

## 2017-02-12 DIAGNOSIS — E785 Hyperlipidemia, unspecified: Secondary | ICD-10-CM | POA: Insufficient documentation

## 2017-02-12 LAB — VAS US CAROTID
LCCAPDIAS: 15 cm/s
LCCAPSYS: 72 cm/s
LEFT ECA DIAS: -26 cm/s
LEFT VERTEBRAL DIAS: 19 cm/s
Left CCA dist dias: -21 cm/s
Left CCA dist sys: -79 cm/s
RCCAPDIAS: 14 cm/s
RIGHT CCA MID DIAS: 15 cm/s
RIGHT ECA DIAS: -14 cm/s
RIGHT VERTEBRAL DIAS: 12 cm/s
Right CCA prox sys: 104 cm/s
Right cca dist sys: -26 cm/s

## 2017-02-12 NOTE — Progress Notes (Signed)
Patient name: [name] MRN: [id number] DOB: May 18, 1946 Sex: male   HPI: [name] is a 71 y.o. male,  presents for continued follow-up of his PAD and bilateral carotid stenosis. He previously underwent right superficial femoral artery stent in February 2015. He had a left carotid stent in 2014 as his disease was felt to be surgically inaccessible intraoperatively. He had a right carotid endarterectomy in 2007. He reports no claudication symptoms. He has no episodes of TIA amaurosis or stroke. He is on Plavix and a statin.    Past Medical History:  Diagnosis Date  . Arthritis   . CAD (coronary artery disease)   . Carotid artery occlusion   . Diabetes mellitus    fasting 100-120s  . Dizziness   . Dysrhythmia    skips beats  . GERD (gastroesophageal reflux disease)   . History of kidney stones   . Hyperlipidemia   . Hypertension   . IHD (ischemic heart disease)    Prior CABG in 2003  . Normal nuclear stress test June 2012  . Peripheral vascular disease ([place])   . Torn rotator cuff    Past Surgical History:  Procedure Laterality Date  . ABDOMINAL AORTAGRAM N/A 12/21/2013   Procedure: ABDOMINAL Maxcine Ham;  Surgeon: Elam Dutch, MD;  Location: [facility] CATH LAB;  Service: Cardiovascular;  Laterality: N/A;  . APPENDECTOMY    . ARCH AORTOGRAM  08/26/2013   Procedure: ARCH AORTOGRAM;  Surgeon: Elam Dutch, MD;  Location: [facility] CATH LAB;  Service: Cardiovascular;;  . CARDIAC CATHETERIZATION  05/03/2002   EF 40-45%  . CARDIOVASCULAR STRESS TEST  08/10/2006   EF 65%  . CAROTID ENDARTERECTOMY    . CAROTID STENT INSERTION Left 08/26/2013   Procedure: CAROTID STENT INSERTION;  Surgeon: Elam Dutch, MD;  Location: [facility] CATH LAB;  Service: Cardiovascular;  Laterality: Left;  internal carotid  . CORONARY ARTERY BYPASS GRAFT  04/2002  . DOPPLER ECHOCARDIOGRAPHY  08/12/2002   EF 80-85%  . ENDARTERECTOMY Left 07/20/2013   Procedure: ATTEMPTED ENDARTERECTOMY CAROTID;  Surgeon: Conrad Stillwater, MD;  Location: [facility];  Service: Vascular;  Laterality: Left;  . HERNIA REPAIR    . SHOULDER SURGERY      Family History  Problem Relation Age of Onset  . Heart attack Mother   . Diabetes Mother   . Heart disease Mother   . Hypertension Mother   . Heart attack Father   . Heart disease Father   . Hypertension Father   . Diabetes Brother   . Diabetes Sister   . Diabetes Daughter   . Heart disease Daughter   . Hypertension Daughter   . Diabetes Sister     SOCIAL HISTORY: Social History   Social History  . Marital status: Married    Spouse name: N/A  . Number of children: N/A  . Years of education: N/A   Occupational History  . Not on file.   Social History Main Topics  . Smoking status: Former Smoker    Quit date: 10/27/2001  . Smokeless tobacco: Never Used  . Alcohol use No  . Drug use: No  . Sexual activity: Not on file   Other Topics Concern  . Not on file   Social History Narrative  . No narrative on file    Allergies  Allergen Reactions  . Lipitor [Atorvastatin Calcium] Other (See Comments)    Muscle ache  . Sulfa Drugs Cross Reactors Other (See Comments)    "  raw spots"  . Codeine Rash    Current Outpatient Prescriptions  Medication Sig Dispense Refill  . amLODipine-benazepril (LOTREL) 5-20 MG per capsule Take 1 capsule by mouth daily.     Marland Kitchen aspirin EC 81 MG tablet Take 81 mg by mouth daily.    . clopidogrel (PLAVIX) 75 MG tablet TAKE ONE TABLET BY MOUTH ONCE DAILY 90 tablet 3  . hydrochlorothiazide (HYDRODIURIL) 12.5 MG tablet Take 12.5 mg by mouth daily.     . Insulin Glargine (LANTUS SOLOSTAR) 100 UNIT/ML SOPN Inject 44 Units into the skin at bedtime.    . insulin lispro (HUMALOG) 100 UNIT/ML KiwkPen Inject 15 Units into the skin 3 (three) times daily. If blood glucose is between 150-200 inject an additional 1 unit into the skin. If blood glucose is between 201-250 inject an additional 2 units into the skin once. If blood glucose is between  251-300 inject an additional 3 units into the skin once. If blood glucose is higher than 300, inject an additional 4 units into the skin once.    . metFORMIN (GLUCOPHAGE-XR) 500 MG 24 hr tablet Take 500 mg by mouth 2 (two) times daily with a meal.     . metoprolol (LOPRESSOR) 50 MG tablet Take 50 mg by mouth 2 (two) times daily.    . nitroGLYCERIN (NITROSTAT) 0.4 MG SL tablet Place 1 tablet (0.4 mg total) under the tongue every 5 (five) minutes as needed for chest pain. 100 tablet PRN  . omeprazole (PRILOSEC) 20 MG capsule Take 20 mg by mouth daily.    . rosuvastatin (CRESTOR) 20 MG tablet Take 1 tablet (20 mg total) by mouth daily. 90 tablet 3   No current facility-administered medications for this visit.     ROS:   General:  No weight loss, Fever, chills  HEENT: No recent headaches, no nasal bleeding, no visual changes, no sore throat  Neurologic: No dizziness, blackouts, seizures. No recent symptoms of stroke or mini- stroke. No recent episodes of slurred speech, or temporary blindness.  Cardiac: No recent episodes of chest pain/pressure, no shortness of breath at rest.  No shortness of breath with exertion.  Denies history of atrial fibrillation or irregular heartbeat  Vascular: No history of rest pain in feet.  No history of claudication.  No history of non-healing ulcer, No history of DVT     Physical Examination  Vitals:   02/12/17 1429 02/12/17 1430  BP: (!) 187/79 (!) 183/82  Pulse: 63   Resp: 16   Temp: 98.7 F (37.1 C)   TempSrc: Oral   SpO2: 97%   Weight: 188 lb (85.3 kg)   Height: 5\' 10"  (1.778 m)     Body mass index is 26.98 kg/m.  General:  Alert and oriented, no acute distress HEENT: Normal Neck: No bruit or JVD Pulmonary: Clear to auscultation bilaterally Cardiac: Regular Rate and Rhythm without murmur Abdomen: Soft, non-tender, non-distended, no mass Skin: No rash Or ulcer Extremity Pulses:  2+ radial, brachial, femoral, absent dorsalis pedis,  posterior tibial pulses bilaterally Musculoskeletal: No deformity or edema  Neurologic: Upper and lower extremity motor 5/5 and symmetric  DATA:  Patient had a right lower extremity duplex exam which showed again some narrowing in the midportion of his right SFA stent. Velocities are unchanged over the last year.  ABI on the right leg was 0.83 left 0.77  The patient also has some in-stent stenosis of his carotid stent which is unchanged from a year ago I reviewed and interpreted  both of these studies.  ASSESSMENT:  #1 carotid occlusive disease possible in-stent restenosis on the left side no significant stenosis on the right carotid endarterectomy site continued follow-up with repeat carotid duplex scan in 1 year.  #2 peripheral arterial disease mild in-stent restenosis right leg currently asymptomatic no significant drop in his ABIs over the past year #3 Coronary artery disease stable asymptomatic well-controlled  #4 Diabetes: Well-controlled #5 Hyperlipidemia currently well controlled on current medication regimen   Ruta Hinds, MD [facility]: [phone] Pager: [phone]

## 2017-02-13 NOTE — Addendum Note (Signed)
Addended by: Lianne Cure A on: 02/13/2017 04:34 PM   Modules accepted: Orders

## 2017-02-19 ENCOUNTER — Encounter (HOSPITAL_COMMUNITY): Payer: Medicare HMO

## 2017-02-19 ENCOUNTER — Ambulatory Visit: Payer: Medicare HMO | Admitting: Vascular Surgery

## 2017-03-22 DIAGNOSIS — K5732 Diverticulitis of large intestine without perforation or abscess without bleeding: Secondary | ICD-10-CM | POA: Diagnosis not present

## 2017-05-06 DIAGNOSIS — E1165 Type 2 diabetes mellitus with hyperglycemia: Secondary | ICD-10-CM | POA: Diagnosis not present

## 2017-05-06 DIAGNOSIS — E785 Hyperlipidemia, unspecified: Secondary | ICD-10-CM | POA: Diagnosis not present

## 2017-05-06 DIAGNOSIS — I10 Essential (primary) hypertension: Secondary | ICD-10-CM | POA: Diagnosis not present

## 2017-05-06 DIAGNOSIS — Z794 Long term (current) use of insulin: Secondary | ICD-10-CM | POA: Diagnosis not present

## 2017-05-06 DIAGNOSIS — E1142 Type 2 diabetes mellitus with diabetic polyneuropathy: Secondary | ICD-10-CM | POA: Diagnosis not present

## 2017-05-11 DIAGNOSIS — B351 Tinea unguium: Secondary | ICD-10-CM | POA: Diagnosis not present

## 2017-05-11 DIAGNOSIS — E1151 Type 2 diabetes mellitus with diabetic peripheral angiopathy without gangrene: Secondary | ICD-10-CM | POA: Diagnosis not present

## 2017-05-11 DIAGNOSIS — Z7901 Long term (current) use of anticoagulants: Secondary | ICD-10-CM | POA: Diagnosis not present

## 2017-05-11 DIAGNOSIS — L602 Onychogryphosis: Secondary | ICD-10-CM | POA: Diagnosis not present

## 2017-05-19 DIAGNOSIS — H2513 Age-related nuclear cataract, bilateral: Secondary | ICD-10-CM | POA: Diagnosis not present

## 2017-05-19 DIAGNOSIS — E119 Type 2 diabetes mellitus without complications: Secondary | ICD-10-CM | POA: Diagnosis not present

## 2017-05-28 DIAGNOSIS — K210 Gastro-esophageal reflux disease with esophagitis: Secondary | ICD-10-CM | POA: Diagnosis not present

## 2017-05-28 DIAGNOSIS — I10 Essential (primary) hypertension: Secondary | ICD-10-CM | POA: Diagnosis not present

## 2017-05-28 DIAGNOSIS — Z0000 Encounter for general adult medical examination without abnormal findings: Secondary | ICD-10-CM | POA: Diagnosis not present

## 2017-06-26 DIAGNOSIS — K5792 Diverticulitis of intestine, part unspecified, without perforation or abscess without bleeding: Secondary | ICD-10-CM | POA: Diagnosis not present

## 2017-07-27 ENCOUNTER — Ambulatory Visit (INDEPENDENT_AMBULATORY_CARE_PROVIDER_SITE_OTHER): Payer: Medicare HMO | Admitting: Cardiovascular Disease

## 2017-07-27 ENCOUNTER — Encounter: Payer: Self-pay | Admitting: Cardiovascular Disease

## 2017-07-27 VITALS — BP 144/66 | HR 70 | Ht 70.0 in | Wt 190.8 lb

## 2017-07-27 DIAGNOSIS — I2510 Atherosclerotic heart disease of native coronary artery without angina pectoris: Secondary | ICD-10-CM

## 2017-07-27 DIAGNOSIS — I10 Essential (primary) hypertension: Secondary | ICD-10-CM

## 2017-07-27 MED ORDER — AMLODIPINE BESY-BENAZEPRIL HCL 5-20 MG PO CAPS: 1.0000 | ORAL_CAPSULE | Freq: Every day | ORAL | 3 refills | Status: DC

## 2017-07-27 MED ORDER — HYDROCHLOROTHIAZIDE 25 MG PO TABS: 25.0000 mg | ORAL_TABLET | Freq: Every day | ORAL | 3 refills | Status: DC

## 2017-07-27 NOTE — Patient Instructions (Signed)
Medication Instructions:  INCREASE HCTZ (Hydrochlorothiazide) to 25 mg once daily   Labwork: Your physician recommends that you return for lab work on Wednesday Oct. 3 anytime between 7:30 am and 4:30 pm You will need to FAST for this appointment - nothing to eat or drink after midnight the night before except water.  Your physician recommends that you return for lab work in: 3 weeks for basic metabolic panel   Testing/Procedures: None Ordered   Follow-Up: Your physician wants you to follow-up in: 6 months with Dr. Acie Fredrickson.  You will receive a reminder letter in the mail two months in advance. If you don't receive a letter, please call our office to schedule the follow-up appointment.   If you need a refill on your cardiac medications before your next appointment, please call your pharmacy.   Thank you for choosing [facility]! Christen Bame, RN [phone]

## 2017-07-27 NOTE — Progress Notes (Signed)
Cardiology Office Note   Date:  07/27/2017   ID:  [name], [name] 23-Mar-1946, MRN [id number]  PCP:  Stephens Shire, MD  Cardiologist: previously Darlin Coco MD , now Mertie Moores, MD   Chief Complaint  Patient presents with  . Follow-up    hyperlipidemia, HTN   Problem list 1. Coronary artery disease-status post CABG 2003 2. Hyperlipidemia 3. Carotid artery disease-status post left carotid stenting , s/p Right CEA  4. Essential hypertension 5. Diabetes mellitus     [name] is a 71 y.o. male who presents for a six-month follow-up visit  Notes from Dr. Mare Ferrari: . He has a past history of coronary artery disease and had coronary artery bypass graft surgery in 2003. He has a remote history of a right carotid endarterectomy about 18 years ago. He had a left carotid endarterectomy by Dr. Oneida Alar in 2014. He has a history of high blood pressure, diabetes mellitus, and hyperlipidemia. He had a normal nuclear stress test in June 2012 prior to rotator cuff surgery. The patient developed claudication of his right leg  and underwent successful vascular surgery in February 2015. Since last visit he has been doing well with no chest pain or shortness of breath. Since last visit he has retired and has been enjoying retirement. He is no longer experiencing any right calf claudication. He has diabetes followed by Dr. Tamala Julian at [facility]. His most recent A1c was 7.2 Since last visit the patient has had no new cardiac symptoms.  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  He himself lives in a townhouse.  Aug. 18, 2017:  [name] is seen today for the first time. He's a previous patient of Dr. Mare Ferrari. He is followed for coronary artery disease, hyperlipidemia, carotid artery disease, hypertension.  Doing well.   Stays active.   Does lots of woodworking . Walks often   No angina   Feb. 14,  2018; Doing well.  No CP or dyspnea. Not exercising much ,  Avoids salt.   Oct. 1, 2018:  Here for eval of HTN. Still eating some salty food . Avoids hot dogs now .  stopped smoking years ago. As active as he can be,  Has a heel spur   Past Medical History:  Diagnosis Date  . Arthritis   . CAD (coronary artery disease)   . Carotid artery occlusion   . Diabetes mellitus    fasting 100-120s  . Dizziness   . Dysrhythmia    skips beats  . GERD (gastroesophageal reflux disease)   . History of kidney stones   . Hyperlipidemia   . Hypertension   . IHD (ischemic heart disease)    Prior CABG in 2003  . Normal nuclear stress test June 2012  . Peripheral vascular disease ([place])   . Torn rotator cuff     Past Surgical History:  Procedure Laterality Date  . ABDOMINAL AORTAGRAM N/A 12/21/2013   Procedure: ABDOMINAL Maxcine Ham;  Surgeon: Elam Dutch, MD;  Location: [facility] CATH LAB;  Service: Cardiovascular;  Laterality: N/A;  . APPENDECTOMY    . ARCH AORTOGRAM  08/26/2013   Procedure: ARCH AORTOGRAM;  Surgeon: Elam Dutch, MD;  Location: [facility] CATH LAB;  Service: Cardiovascular;;  . CARDIAC CATHETERIZATION  05/03/2002   EF 40-45%  . CARDIOVASCULAR STRESS TEST  08/10/2006   EF 65%  . CAROTID ENDARTERECTOMY    .  CAROTID STENT INSERTION Left 08/26/2013   Procedure: CAROTID STENT INSERTION;  Surgeon: Elam Dutch, MD;  Location: [facility] CATH LAB;  Service: Cardiovascular;  Laterality: Left;  internal carotid  . CORONARY ARTERY BYPASS GRAFT  04/2002  . DOPPLER ECHOCARDIOGRAPHY  08/12/2002   EF 80-85%  . ENDARTERECTOMY Left 07/20/2013   Procedure: ATTEMPTED ENDARTERECTOMY CAROTID;  Surgeon: Conrad Pavillion, MD;  Location: [facility];  Service: Vascular;  Laterality: Left;  . HERNIA REPAIR    . SHOULDER SURGERY       Current Outpatient Prescriptions  Medication Sig Dispense Refill  . amLODipine-benazepril (LOTREL) 5-20 MG per capsule Take 1 capsule by mouth daily.     Marland Kitchen aspirin EC 81 MG  tablet Take 81 mg by mouth daily.    . clopidogrel (PLAVIX) 75 MG tablet TAKE ONE TABLET BY MOUTH ONCE DAILY 90 tablet 3  . hydrochlorothiazide (HYDRODIURIL) 12.5 MG tablet Take 12.5 mg by mouth daily.     . Insulin Glargine (LANTUS SOLOSTAR) 100 UNIT/ML SOPN Inject 44 Units into the skin at bedtime.    . insulin lispro (HUMALOG) 100 UNIT/ML KiwkPen Inject 15 Units into the skin 3 (three) times daily. If blood glucose is between 150-200 inject an additional 1 unit into the skin. If blood glucose is between 201-250 inject an additional 2 units into the skin once. If blood glucose is between 251-300 inject an additional 3 units into the skin once. If blood glucose is higher than 300, inject an additional 4 units into the skin once.    . metFORMIN (GLUCOPHAGE-XR) 500 MG 24 hr tablet Take 500 mg by mouth 2 (two) times daily with a meal.     . metoprolol (LOPRESSOR) 50 MG tablet Take 50 mg by mouth 2 (two) times daily.    . nitroGLYCERIN (NITROSTAT) 0.4 MG SL tablet Place 1 tablet (0.4 mg total) under the tongue every 5 (five) minutes as needed for chest pain. 100 tablet PRN  . omeprazole (PRILOSEC) 20 MG capsule Take 20 mg by mouth daily.    . rosuvastatin (CRESTOR) 20 MG tablet Take 1 tablet (20 mg total) by mouth daily. 90 tablet 3   No current facility-administered medications for this visit.     Allergies:   Lipitor [atorvastatin calcium]; Sulfa drugs cross reactors; and Codeine    Social History:  The patient  reports that he quit smoking about 15 years ago. He has never used smokeless tobacco. He reports that he does not drink alcohol or use drugs.   Family History:  The patient's family history includes Diabetes in his brother, daughter, mother, sister, and sister; Heart attack in his father and mother; Heart disease in his daughter, father, and mother; Hypertension in his daughter, father, and mother.    ROS:  Please see the history of present illness.   Otherwise, review of systems are  positive for none.   All other systems are reviewed and negative.    Physical Exam: Blood pressure (!) 144/66, pulse 70, height 5\' 10"  (1.778 m), weight 190 lb 12.8 oz (86.5 kg), SpO2 97 %.  GEN:  Well nourished, well developed in no acute distress HEENT: Normal NECK: No JVD; No carotid bruits LYMPHATICS: No lymphadenopathy CARDIAC: RR, no murmurs, rubs, gallops RESPIRATORY:  Clear to auscultation without rales, wheezing or rhonchi  ABDOMEN: Soft, non-tender, non-distended MUSCULOSKELETAL:  No edema; No deformity  SKIN: Warm and dry NEUROLOGIC:  Alert and oriented x 3    EKG:  EKG is not ordered today.  Recent Labs: 12/10/2016: ALT 22; BUN 10; Creatinine, Ser 0.97; Potassium 4.7; Sodium 139    Lipid Panel    Component Value Date/Time   CHOL 113 12/10/2016 0912   TRIG 112 12/10/2016 0912   HDL 32 (L) 12/10/2016 0912   CHOLHDL 3.5 12/10/2016 0912   CHOLHDL 3.2 06/13/2016 0918   VLDL 21 06/13/2016 0918   LDLCALC 59 12/10/2016 0912      Wt Readings from Last 3 Encounters:  07/27/17 190 lb 12.8 oz (86.5 kg)  02/12/17 188 lb (85.3 kg)  12/10/16 193 lb (87.5 kg)        ASSESSMENT AND PLAN:  1.  CAD :   Status post coronary artery bypass grafting in 2003. He's not had any episodes of angina.  2. diabetes mellitus insulin-   managed by his primary medical doctor  3. hypertensive heart disease without heart failure.  His been eating lots of fast foods. His blood pressure is mildly elevated. We'll increase his HCTZ to 25 mg a day. We will check a basic metabolic profile in 3 weeks.   4. Hyperlipidemia.   Fasting lipid panel  and complete medical profile to be drawn in 2 days  6. Bilateral carotid artery disease:   followed by Dr. Oneida Alar   Current medicines are reviewed at length with the patient today.  The patient does not have concerns regarding medicines.  The following changes have been made:  no change  Labs/ tests ordered today include:   No orders  of the defined types were placed in this encounter.     Mertie Moores, MD  07/27/2017 4:02 PM    [facility] [place],  [place],   [address] Pager [phone] Phone: [phone]; Fax: [phone]

## 2017-07-27 NOTE — Progress Notes (Signed)
3

## 2017-07-29 ENCOUNTER — Other Ambulatory Visit: Payer: Medicare HMO

## 2017-07-29 DIAGNOSIS — I10 Essential (primary) hypertension: Secondary | ICD-10-CM | POA: Diagnosis not present

## 2017-07-29 DIAGNOSIS — I2510 Atherosclerotic heart disease of native coronary artery without angina pectoris: Secondary | ICD-10-CM

## 2017-07-29 LAB — HEPATIC FUNCTION PANEL
ALT: 21 IU/L (ref 0–44)
AST: 22 IU/L (ref 0–40)
Albumin: 4.2 g/dL (ref 3.6–4.8)
Alkaline Phosphatase: 81 IU/L (ref 39–117)
Bilirubin Total: 0.4 mg/dL (ref 0.0–1.2)
Bilirubin, Direct: 0.13 mg/dL (ref 0.00–0.40)
Total Protein: 7.3 g/dL (ref 6.0–8.5)

## 2017-07-29 LAB — BASIC METABOLIC PANEL
BUN/Creatinine Ratio: 15 (ref 10–24)
BUN: 13 mg/dL (ref 8–27)
CALCIUM: 9.6 mg/dL (ref 8.6–10.2)
CO2: 22 mmol/L (ref 20–29)
CREATININE: 0.85 mg/dL (ref 0.76–1.27)
Chloride: 101 mmol/L (ref 96–106)
GFR calc Af Amer: 103 mL/min/{1.73_m2} (ref >59)
GFR, EST NON AFRICAN AMERICAN: 89 mL/min/{1.73_m2} (ref >59)
Glucose: 116 mg/dL — ABNORMAL HIGH (ref 65–99)
Potassium: 4.1 mmol/L (ref 3.5–5.2)
Sodium: 139 mmol/L (ref 134–144)

## 2017-07-29 LAB — LIPID PANEL
CHOL/HDL RATIO: 3.6 ratio (ref 0.0–5.0)
Cholesterol, Total: 120 mg/dL (ref 100–199)
HDL: 33 mg/dL — AB (ref >39)
LDL CALC: 59 mg/dL (ref 0–99)
TRIGLYCERIDES: 139 mg/dL (ref 0–149)
VLDL Cholesterol Cal: 28 mg/dL (ref 5–40)

## 2017-08-04 ENCOUNTER — Other Ambulatory Visit: Payer: Self-pay | Admitting: Cardiovascular Disease

## 2017-08-04 DIAGNOSIS — M722 Plantar fascial fibromatosis: Secondary | ICD-10-CM | POA: Diagnosis not present

## 2017-08-04 DIAGNOSIS — M7752 Other enthesopathy of left foot: Secondary | ICD-10-CM | POA: Diagnosis not present

## 2017-08-05 DIAGNOSIS — R69 Illness, unspecified: Secondary | ICD-10-CM | POA: Diagnosis not present

## 2017-08-11 DIAGNOSIS — E1165 Type 2 diabetes mellitus with hyperglycemia: Secondary | ICD-10-CM | POA: Diagnosis not present

## 2017-08-11 DIAGNOSIS — E785 Hyperlipidemia, unspecified: Secondary | ICD-10-CM | POA: Diagnosis not present

## 2017-08-11 DIAGNOSIS — Z794 Long term (current) use of insulin: Secondary | ICD-10-CM | POA: Diagnosis not present

## 2017-08-11 DIAGNOSIS — E1142 Type 2 diabetes mellitus with diabetic polyneuropathy: Secondary | ICD-10-CM | POA: Diagnosis not present

## 2017-08-11 DIAGNOSIS — I10 Essential (primary) hypertension: Secondary | ICD-10-CM | POA: Diagnosis not present

## 2017-08-18 ENCOUNTER — Other Ambulatory Visit: Payer: Medicare HMO

## 2017-08-21 ENCOUNTER — Other Ambulatory Visit: Payer: Medicare HMO | Admitting: *Deleted

## 2017-08-21 DIAGNOSIS — I10 Essential (primary) hypertension: Secondary | ICD-10-CM | POA: Diagnosis not present

## 2017-08-21 DIAGNOSIS — I2510 Atherosclerotic heart disease of native coronary artery without angina pectoris: Secondary | ICD-10-CM | POA: Diagnosis not present

## 2017-08-21 LAB — BASIC METABOLIC PANEL
BUN / CREAT RATIO: 13 (ref 10–24)
BUN: 12 mg/dL (ref 8–27)
CALCIUM: 9.5 mg/dL (ref 8.6–10.2)
CHLORIDE: 98 mmol/L (ref 96–106)
CO2: 22 mmol/L (ref 20–29)
Creatinine, Ser: 0.93 mg/dL (ref 0.76–1.27)
GFR, EST AFRICAN AMERICAN: 96 mL/min/{1.73_m2} (ref >59)
GFR, EST NON AFRICAN AMERICAN: 83 mL/min/{1.73_m2} (ref >59)
Glucose: 212 mg/dL — ABNORMAL HIGH (ref 65–99)
POTASSIUM: 4.3 mmol/L (ref 3.5–5.2)
SODIUM: 136 mmol/L (ref 134–144)

## 2017-08-27 ENCOUNTER — Other Ambulatory Visit: Payer: Self-pay | Admitting: Cardiovascular Disease

## 2017-08-27 MED ORDER — METOPROLOL TARTRATE 50 MG PO TABS: 50.0000 mg | ORAL_TABLET | Freq: Two times a day (BID) | ORAL | 3 refills | Status: DC

## 2017-09-07 DIAGNOSIS — K2270 Barrett's esophagus without dysplasia: Secondary | ICD-10-CM | POA: Diagnosis not present

## 2017-09-07 DIAGNOSIS — Z1211 Encounter for screening for malignant neoplasm of colon: Secondary | ICD-10-CM | POA: Diagnosis not present

## 2017-09-14 DIAGNOSIS — E1151 Type 2 diabetes mellitus with diabetic peripheral angiopathy without gangrene: Secondary | ICD-10-CM | POA: Diagnosis not present

## 2017-09-14 DIAGNOSIS — Z7901 Long term (current) use of anticoagulants: Secondary | ICD-10-CM | POA: Diagnosis not present

## 2017-09-14 DIAGNOSIS — B351 Tinea unguium: Secondary | ICD-10-CM | POA: Diagnosis not present

## 2017-09-14 DIAGNOSIS — L602 Onychogryphosis: Secondary | ICD-10-CM | POA: Diagnosis not present

## 2017-09-16 DIAGNOSIS — K2270 Barrett's esophagus without dysplasia: Secondary | ICD-10-CM | POA: Diagnosis not present

## 2017-10-23 ENCOUNTER — Telehealth: Payer: Self-pay

## 2017-10-23 NOTE — Telephone Encounter (Signed)
Returned call to [name] and he stated that he has an appointment set up for 02/18/18 but the pain in his legs is worse. Describes pain as cramping when he walks. He stated that [name] told him to call if pain worsens. We made an appointment for him on 11/19/17 at 8:45 am with [name]. I instructed patient to call if anything changes before then.

## 2017-10-29 DIAGNOSIS — M10079 Idiopathic gout, unspecified ankle and foot: Secondary | ICD-10-CM | POA: Diagnosis not present

## 2017-10-29 DIAGNOSIS — E1151 Type 2 diabetes mellitus with diabetic peripheral angiopathy without gangrene: Secondary | ICD-10-CM | POA: Diagnosis not present

## 2017-10-29 DIAGNOSIS — I739 Peripheral vascular disease, unspecified: Secondary | ICD-10-CM | POA: Diagnosis not present

## 2017-11-11 ENCOUNTER — Telehealth: Payer: Self-pay

## 2017-11-11 NOTE — Telephone Encounter (Signed)
Returned call to patient's daughter. She called to say that her father's right foot is worse, along with pain and swelling he has developed sores at the base of his great toe and 2nd and 3rd toe. He was seen by his podiatrist last week and they told them they felt it was vascular related and felt they needed to be seen by [place]. Appointment with [place] was changed from 11/19/17 to tomorrow at 10:15 with Dr. Oneida Alar.

## 2017-11-12 ENCOUNTER — Encounter: Payer: Self-pay | Admitting: Vascular Surgery

## 2017-11-12 ENCOUNTER — Ambulatory Visit (INDEPENDENT_AMBULATORY_CARE_PROVIDER_SITE_OTHER)
Admission: RE | Admit: 2017-11-12 | Discharge: 2017-11-12 | Disposition: A | Discharge status: Home / Self Care | Payer: Medicare HMO | Source: Ambulatory Visit | Attending: Vascular Surgery | Admitting: Vascular Surgery

## 2017-11-12 ENCOUNTER — Ambulatory Visit (HOSPITAL_COMMUNITY)
Admission: RE | Admit: 2017-11-12 | Discharge: 2017-11-12 | Disposition: A | Discharge status: Home / Self Care | Payer: Medicare HMO | Source: Ambulatory Visit | Attending: Vascular Surgery | Admitting: Vascular Surgery

## 2017-11-12 ENCOUNTER — Encounter: Payer: Self-pay | Admitting: *Deleted

## 2017-11-12 ENCOUNTER — Other Ambulatory Visit: Payer: Self-pay

## 2017-11-12 ENCOUNTER — Ambulatory Visit: Payer: Medicare HMO | Admitting: Vascular Surgery

## 2017-11-12 ENCOUNTER — Other Ambulatory Visit: Payer: Self-pay | Admitting: *Deleted

## 2017-11-12 VITALS — BP 150/80 | HR 65 | Temp 97.1°F | Resp 20 | Ht 70.0 in | Wt 191.0 lb

## 2017-11-12 DIAGNOSIS — I739 Peripheral vascular disease, unspecified: Secondary | ICD-10-CM

## 2017-11-12 MED ORDER — OXYCODONE-ACETAMINOPHEN 5-325 MG PO TABS: 1.0000 | ORAL_TABLET | Freq: Four times a day (QID) | ORAL | 0 refills | Status: DC | PRN

## 2017-11-12 NOTE — Progress Notes (Signed)
Patient name: [name] MRN: [id number] DOB: 1946-05-31 Sex: male   HPI: [name] is a 72 y.o. male who complains of pain in his right foot that has been present for about 3 weeks.  He was initially diagnosed with gout and had some colchicine for a few days and this helped but never completely resolved the pain.  About 5 days ago several spots popped up on his foot that have turned into nonhealing ulcers.  He states he has continuous pain in the right foot.  His walking distance has decreased from 3 blocks to 1 block before experiencing right leg claudication symptoms.  He had a right superficial femoral artery stent placed in 2015.  His ABI was 0.8 at his last office visit about a year ago.  We have been also following him for carotid occlusive disease.  He had a left carotid stent placed in 2014.  He had a right carotid endarterectomy in 2007.  Other medical problems include coronary artery disease diabetes hyperlipidemia hypertension all of which have been stable.  He states he is not smoking.  He is on Plavix and a statin.  Past Medical History:  Diagnosis Date  . Arthritis   . CAD (coronary artery disease)   . Carotid artery occlusion   . Diabetes mellitus    fasting 100-120s  . Dizziness   . Dysrhythmia    skips beats  . GERD (gastroesophageal reflux disease)   . History of kidney stones   . Hyperlipidemia   . Hypertension   . IHD (ischemic heart disease)    Prior CABG in 2003  . Normal nuclear stress test June 2012  . Peripheral vascular disease ([place])   . Torn rotator cuff    Past Surgical History:  Procedure Laterality Date  . ABDOMINAL AORTAGRAM N/A 12/21/2013   Procedure: ABDOMINAL Maxcine Ham;  Surgeon: Elam Dutch, MD;  Location: [facility] CATH LAB;  Service: Cardiovascular;  Laterality: N/A;  . APPENDECTOMY    . ARCH AORTOGRAM  08/26/2013   Procedure: ARCH AORTOGRAM;  Surgeon: Elam Dutch, MD;  Location: [facility] CATH LAB;  Service: Cardiovascular;;  . CARDIAC  CATHETERIZATION  05/03/2002   EF 40-45%  . CARDIOVASCULAR STRESS TEST  08/10/2006   EF 65%  . CAROTID ENDARTERECTOMY    . CAROTID STENT INSERTION Left 08/26/2013   Procedure: CAROTID STENT INSERTION;  Surgeon: Elam Dutch, MD;  Location: [facility] CATH LAB;  Service: Cardiovascular;  Laterality: Left;  internal carotid  . CORONARY ARTERY BYPASS GRAFT  04/2002  . DOPPLER ECHOCARDIOGRAPHY  08/12/2002   EF 80-85%  . ENDARTERECTOMY Left 07/20/2013   Procedure: ATTEMPTED ENDARTERECTOMY CAROTID;  Surgeon: Conrad McKenzie, MD;  Location: [place];  Service: Vascular;  Laterality: Left;  . HERNIA REPAIR    . SHOULDER SURGERY      Family History  Problem Relation Age of Onset  . Heart attack Mother   . Diabetes Mother   . Heart disease Mother   . Hypertension Mother   . Heart attack Father   . Heart disease Father   . Hypertension Father   . Diabetes Brother   . Diabetes Sister   . Diabetes Daughter   . Heart disease Daughter   . Hypertension Daughter   . Diabetes Sister     SOCIAL HISTORY: Social History   Socioeconomic History  . Marital status: Married    Spouse name: Not on file  . Number of children: Not on file  .  Years of education: Not on file  . Highest education level: Not on file  Social Needs  . Financial resource strain: Not on file  . Food insecurity - worry: Not on file  . Food insecurity - inability: Not on file  . Transportation needs - medical: Not on file  . Transportation needs - non-medical: Not on file  Occupational History  . Not on file  Tobacco Use  . Smoking status: Former Smoker    Last attempt to quit: 10/27/2001    Years since quitting: 16.0  . Smokeless tobacco: Never Used  Substance and Sexual Activity  . Alcohol use: No    Alcohol/week: 0.0 oz  . Drug use: No  . Sexual activity: Not on file  Other Topics Concern  . Not on file  Social History Narrative  . Not on file    Allergies  Allergen Reactions  . Lipitor [Atorvastatin Calcium] Other  (See Comments)    Muscle ache  . Sulfa Drugs Cross Reactors Other (See Comments)    "raw spots"  . Codeine Rash    Current Outpatient Medications  Medication Sig Dispense Refill  . amLODipine-benazepril (LOTREL) 5-20 MG capsule Take 1 capsule by mouth daily. 90 capsule 3  . aspirin EC 81 MG tablet Take 81 mg by mouth daily.    . clopidogrel (PLAVIX) 75 MG tablet TAKE ONE TABLET BY MOUTH ONCE DAILY 90 tablet 3  . Insulin Glargine (LANTUS SOLOSTAR) 100 UNIT/ML SOPN Inject 44 Units into the skin at bedtime.    . insulin lispro (HUMALOG) 100 UNIT/ML KiwkPen Inject 15 Units into the skin 3 (three) times daily. If blood glucose is between 150-200 inject an additional 1 unit into the skin. If blood glucose is between 201-250 inject an additional 2 units into the skin once. If blood glucose is between 251-300 inject an additional 3 units into the skin once. If blood glucose is higher than 300, inject an additional 4 units into the skin once.    . metFORMIN (GLUCOPHAGE-XR) 500 MG 24 hr tablet Take 500 mg by mouth 2 (two) times daily with a meal.     . metoprolol tartrate (LOPRESSOR) 50 MG tablet Take 1 tablet (50 mg total) by mouth 2 (two) times daily. 180 tablet 3  . nitroGLYCERIN (NITROSTAT) 0.4 MG SL tablet Place 1 tablet (0.4 mg total) under the tongue every 5 (five) minutes as needed for chest pain. 100 tablet PRN  . omeprazole (PRILOSEC) 20 MG capsule Take 20 mg by mouth daily.    . rosuvastatin (CRESTOR) 20 MG tablet TAKE ONE TABLET BY MOUTH ONCE DAILY 90 tablet 3  . hydrochlorothiazide (HYDRODIURIL) 25 MG tablet Take 1 tablet (25 mg total) by mouth daily. 90 tablet 3   No current facility-administered medications for this visit.     ROS:   General:  No weight loss, Fever, chills  HEENT: No recent headaches, no nasal bleeding, no visual changes, no sore throat  Neurologic: No dizziness, blackouts, seizures. No recent symptoms of stroke or mini- stroke. No recent episodes of slurred  speech, or temporary blindness.  Cardiac: No recent episodes of chest pain/pressure, no shortness of breath at rest.  No shortness of breath with exertion.  Denies history of atrial fibrillation or irregular heartbeat  Vascular: + history of rest pain in feet.  + history of claudication.  No history of non-healing ulcer, No history of DVT   Pulmonary: No home oxygen, no productive cough, no hemoptysis,  No asthma or wheezing  Musculoskeletal:  [X]  Arthritis, [ ]  Low back pain,  [X]  Joint pain  Hematologic:No history of hypercoagulable state.  No history of easy bleeding.  No history of anemia  Gastrointestinal: No hematochezia or melena,  No gastroesophageal reflux, no trouble swallowing  Urinary: [ ]  chronic Kidney disease, [ ]  on HD - [ ]  MWF or [ ]  TTHS, [ ]  Burning with urination, [ ]  Frequent urination, [ ]  Difficulty urinating;   Skin: No rashes  Psychological: No history of anxiety,  No history of depression   Physical Examination  Vitals:   11/12/17 1019 11/12/17 1020  BP: (!) 151/86 (!) 150/80  Pulse: 65   Resp: 20   Temp: (!) 97.1 F (36.2 C)   TempSrc: Oral   SpO2: 96%   Weight: 191 lb (86.6 kg)   Height: 5\' 10"  (1.778 m)     Body mass index is 27.41 kg/m.  General:  Alert and oriented, no acute distress HEENT: Normal Neck: No bruit or JVD Pulmonary: Clear to auscultation bilaterally Cardiac: Regular Rate and Rhythm without murmur Abdomen: Soft, non-tender, non-distended, no mass Skin: No rash, several scattered ulcerations dorsum right foot adjacent to the toe joints foot is ruborous Extremity Pulses:  2+ radial, brachial, femoral, absent popliteal dorsalis pedis, posterior tibial pulses bilaterally Musculoskeletal: No deformity or edema  Neurologic: Upper and lower extremity motor 5/5 and symmetric  DATA:  Patient had a duplex ultrasound today which showed a trickle of flow through his right superficial femoral artery stent.  ABI today was 0.39 on the  right decreased from 0.8.  ABI on the left was 0.6 which is similar to one year ago when it was 0.7  ASSESSMENT: Subtotal or total occlusion right superficial femoral artery stent now with limb threatening ischemia right foot with nonhealing wounds and rest pain   PLAN: Abdominal aortogram with bilateral lower extremity runoff possible intervention tomorrow.  The patient will continue his Plavix and a statin.  I discussed the risk benefits possible complications and procedure details of his arteriogram including but not limited to bleeding infection vessel injury potential limb loss if we are unable to complete revascularization.  I also discussed with him the possibility that he may require bypass operation which will be done in the future on a different day from his arteriogram.   The patient was given a prescription today for Percocet No. 30 dispensed to control his rest pain until we can get perfusion improved to his right foot.  Ruta Hinds, MD [facility] [place] Office: [phone] Pager: [phone]

## 2017-11-12 NOTE — H&P (View-Only) (Signed)
Patient name: [name] MRN: [id number] DOB: 11/08/1945 Sex: male   HPI: KAMAURY CUTBIRTH is a 72 y.o. male who complains of pain in his right foot that has been present for about 3 weeks.  He was initially diagnosed with gout and had some colchicine for a few days and this helped but never completely resolved the pain.  About 5 days ago several spots popped up on his foot that have turned into nonhealing ulcers.  He states he has continuous pain in the right foot.  His walking distance has decreased from 3 blocks to 1 block before experiencing right leg claudication symptoms.  He had a right superficial femoral artery stent placed in 2015.  His ABI was 0.8 at his last office visit about a year ago.  We have been also following him for carotid occlusive disease.  He had a left carotid stent placed in 2014.  He had a right carotid endarterectomy in 2007.  Other medical problems include coronary artery disease diabetes hyperlipidemia hypertension all of which have been stable.  He states he is not smoking.  He is on Plavix and a statin.  Past Medical History:  Diagnosis Date  . Arthritis   . CAD (coronary artery disease)   . Carotid artery occlusion   . Diabetes mellitus    fasting 100-120s  . Dizziness   . Dysrhythmia    skips beats  . GERD (gastroesophageal reflux disease)   . History of kidney stones   . Hyperlipidemia   . Hypertension   . IHD (ischemic heart disease)    Prior CABG in 2003  . Normal nuclear stress test June 2012  . Peripheral vascular disease ([place])   . Torn rotator cuff    Past Surgical History:  Procedure Laterality Date  . ABDOMINAL AORTAGRAM N/A 12/21/2013   Procedure: ABDOMINAL Maxcine Ham;  Surgeon: Elam Dutch, MD;  Location: [facility] CATH LAB;  Service: Cardiovascular;  Laterality: N/A;  . APPENDECTOMY    . ARCH AORTOGRAM  08/26/2013   Procedure: ARCH AORTOGRAM;  Surgeon: Elam Dutch, MD;  Location: [facility] CATH LAB;  Service: Cardiovascular;;  . CARDIAC  CATHETERIZATION  05/03/2002   EF 40-45%  . CARDIOVASCULAR STRESS TEST  08/10/2006   EF 65%  . CAROTID ENDARTERECTOMY    . CAROTID STENT INSERTION Left 08/26/2013   Procedure: CAROTID STENT INSERTION;  Surgeon: Elam Dutch, MD;  Location: [facility] CATH LAB;  Service: Cardiovascular;  Laterality: Left;  internal carotid  . CORONARY ARTERY BYPASS GRAFT  04/2002  . DOPPLER ECHOCARDIOGRAPHY  08/12/2002   EF 80-85%  . ENDARTERECTOMY Left 07/20/2013   Procedure: ATTEMPTED ENDARTERECTOMY CAROTID;  Surgeon: Conrad Mariposa, MD;  Location: [place];  Service: Vascular;  Laterality: Left;  . HERNIA REPAIR    . SHOULDER SURGERY      Family History  Problem Relation Age of Onset  . Heart attack Mother   . Diabetes Mother   . Heart disease Mother   . Hypertension Mother   . Heart attack Father   . Heart disease Father   . Hypertension Father   . Diabetes Brother   . Diabetes Sister   . Diabetes Daughter   . Heart disease Daughter   . Hypertension Daughter   . Diabetes Sister     SOCIAL HISTORY: Social History   Socioeconomic History  . Marital status: Married    Spouse name: Not on file  . Number of children: Not on file  .  Years of education: Not on file  . Highest education level: Not on file  Social Needs  . Financial resource strain: Not on file  . Food insecurity - worry: Not on file  . Food insecurity - inability: Not on file  . Transportation needs - medical: Not on file  . Transportation needs - non-medical: Not on file  Occupational History  . Not on file  Tobacco Use  . Smoking status: Former Smoker    Last attempt to quit: 10/27/2001    Years since quitting: 16.0  . Smokeless tobacco: Never Used  Substance and Sexual Activity  . Alcohol use: No    Alcohol/week: 0.0 oz  . Drug use: No  . Sexual activity: Not on file  Other Topics Concern  . Not on file  Social History Narrative  . Not on file    Allergies  Allergen Reactions  . Lipitor [Atorvastatin Calcium] Other  (See Comments)    Muscle ache  . Sulfa Drugs Cross Reactors Other (See Comments)    "raw spots"  . Codeine Rash    Current Outpatient Medications  Medication Sig Dispense Refill  . amLODipine-benazepril (LOTREL) 5-20 MG capsule Take 1 capsule by mouth daily. 90 capsule 3  . aspirin EC 81 MG tablet Take 81 mg by mouth daily.    . clopidogrel (PLAVIX) 75 MG tablet TAKE ONE TABLET BY MOUTH ONCE DAILY 90 tablet 3  . Insulin Glargine (LANTUS SOLOSTAR) 100 UNIT/ML SOPN Inject 44 Units into the skin at bedtime.    . insulin lispro (HUMALOG) 100 UNIT/ML KiwkPen Inject 15 Units into the skin 3 (three) times daily. If blood glucose is between 150-200 inject an additional 1 unit into the skin. If blood glucose is between 201-250 inject an additional 2 units into the skin once. If blood glucose is between 251-300 inject an additional 3 units into the skin once. If blood glucose is higher than 300, inject an additional 4 units into the skin once.    . metFORMIN (GLUCOPHAGE-XR) 500 MG 24 hr tablet Take 500 mg by mouth 2 (two) times daily with a meal.     . metoprolol tartrate (LOPRESSOR) 50 MG tablet Take 1 tablet (50 mg total) by mouth 2 (two) times daily. 180 tablet 3  . nitroGLYCERIN (NITROSTAT) 0.4 MG SL tablet Place 1 tablet (0.4 mg total) under the tongue every 5 (five) minutes as needed for chest pain. 100 tablet PRN  . omeprazole (PRILOSEC) 20 MG capsule Take 20 mg by mouth daily.    . rosuvastatin (CRESTOR) 20 MG tablet TAKE ONE TABLET BY MOUTH ONCE DAILY 90 tablet 3  . hydrochlorothiazide (HYDRODIURIL) 25 MG tablet Take 1 tablet (25 mg total) by mouth daily. 90 tablet 3   No current facility-administered medications for this visit.     ROS:   General:  No weight loss, Fever, chills  HEENT: No recent headaches, no nasal bleeding, no visual changes, no sore throat  Neurologic: No dizziness, blackouts, seizures. No recent symptoms of stroke or mini- stroke. No recent episodes of slurred  speech, or temporary blindness.  Cardiac: No recent episodes of chest pain/pressure, no shortness of breath at rest.  No shortness of breath with exertion.  Denies history of atrial fibrillation or irregular heartbeat  Vascular: + history of rest pain in feet.  + history of claudication.  No history of non-healing ulcer, No history of DVT   Pulmonary: No home oxygen, no productive cough, no hemoptysis,  No asthma or wheezing  Musculoskeletal:  [X]  Arthritis, [ ]  Low back pain,  [X]  Joint pain  Hematologic:No history of hypercoagulable state.  No history of easy bleeding.  No history of anemia  Gastrointestinal: No hematochezia or melena,  No gastroesophageal reflux, no trouble swallowing  Urinary: [ ]  chronic Kidney disease, [ ]  on HD - [ ]  MWF or [ ]  TTHS, [ ]  Burning with urination, [ ]  Frequent urination, [ ]  Difficulty urinating;   Skin: No rashes  Psychological: No history of anxiety,  No history of depression   Physical Examination  Vitals:   11/12/17 1019 11/12/17 1020  BP: (!) 151/86 (!) 150/80  Pulse: 65   Resp: 20   Temp: (!) 97.1 F (36.2 C)   TempSrc: Oral   SpO2: 96%   Weight: 191 lb (86.6 kg)   Height: 5\' 10"  (1.778 m)     Body mass index is 27.41 kg/m.  General:  Alert and oriented, no acute distress HEENT: Normal Neck: No bruit or JVD Pulmonary: Clear to auscultation bilaterally Cardiac: Regular Rate and Rhythm without murmur Abdomen: Soft, non-tender, non-distended, no mass Skin: No rash, several scattered ulcerations dorsum right foot adjacent to the toe joints foot is ruborous Extremity Pulses:  2+ radial, brachial, femoral, absent popliteal dorsalis pedis, posterior tibial pulses bilaterally Musculoskeletal: No deformity or edema  Neurologic: Upper and lower extremity motor 5/5 and symmetric  DATA:  Patient had a duplex ultrasound today which showed a trickle of flow through his right superficial femoral artery stent.  ABI today was 0.39 on the  right decreased from 0.8.  ABI on the left was 0.6 which is similar to one year ago when it was 0.7  ASSESSMENT: Subtotal or total occlusion right superficial femoral artery stent now with limb threatening ischemia right foot with nonhealing wounds and rest pain   PLAN: Abdominal aortogram with bilateral lower extremity runoff possible intervention tomorrow.  The patient will continue his Plavix and a statin.  I discussed the risk benefits possible complications and procedure details of his arteriogram including but not limited to bleeding infection vessel injury potential limb loss if we are unable to complete revascularization.  I also discussed with him the possibility that he may require bypass operation which will be done in the future on a different day from his arteriogram.   The patient was given a prescription today for Percocet No. 30 dispensed to control his rest pain until we can get perfusion improved to his right foot.  Ruta Hinds, MD [facility] [place] Office: [phone] Pager: [phone]

## 2017-11-13 ENCOUNTER — Encounter (HOSPITAL_COMMUNITY)
Admission: RE | Disposition: A | Discharge status: Home / Self Care | Payer: Self-pay | Source: Ambulatory Visit | Attending: Vascular Surgery

## 2017-11-13 ENCOUNTER — Ambulatory Visit (HOSPITAL_COMMUNITY)
Admission: RE | Admit: 2017-11-13 | Discharge: 2017-11-13 | Disposition: A | Discharge status: Home / Self Care | Payer: Medicare HMO | Source: Ambulatory Visit | Attending: Vascular Surgery | Admitting: Vascular Surgery

## 2017-11-13 DIAGNOSIS — Z951 Presence of aortocoronary bypass graft: Secondary | ICD-10-CM | POA: Insufficient documentation

## 2017-11-13 DIAGNOSIS — Z79899 Other long term (current) drug therapy: Secondary | ICD-10-CM | POA: Diagnosis not present

## 2017-11-13 DIAGNOSIS — I6529 Occlusion and stenosis of unspecified carotid artery: Secondary | ICD-10-CM | POA: Diagnosis not present

## 2017-11-13 DIAGNOSIS — K219 Gastro-esophageal reflux disease without esophagitis: Secondary | ICD-10-CM | POA: Diagnosis not present

## 2017-11-13 DIAGNOSIS — L97519 Non-pressure chronic ulcer of other part of right foot with unspecified severity: Secondary | ICD-10-CM | POA: Insufficient documentation

## 2017-11-13 DIAGNOSIS — Z794 Long term (current) use of insulin: Secondary | ICD-10-CM | POA: Insufficient documentation

## 2017-11-13 DIAGNOSIS — Z7902 Long term (current) use of antithrombotics/antiplatelets: Secondary | ICD-10-CM | POA: Diagnosis not present

## 2017-11-13 DIAGNOSIS — Y831 Surgical operation with implant of artificial internal device as the cause of abnormal reaction of the patient, or of later complication, without mention of misadventure at the time of the procedure: Secondary | ICD-10-CM | POA: Insufficient documentation

## 2017-11-13 DIAGNOSIS — I2510 Atherosclerotic heart disease of native coronary artery without angina pectoris: Secondary | ICD-10-CM | POA: Diagnosis not present

## 2017-11-13 DIAGNOSIS — Z7982 Long term (current) use of aspirin: Secondary | ICD-10-CM | POA: Insufficient documentation

## 2017-11-13 DIAGNOSIS — I10 Essential (primary) hypertension: Secondary | ICD-10-CM | POA: Insufficient documentation

## 2017-11-13 DIAGNOSIS — Z87891 Personal history of nicotine dependence: Secondary | ICD-10-CM | POA: Insufficient documentation

## 2017-11-13 DIAGNOSIS — E1151 Type 2 diabetes mellitus with diabetic peripheral angiopathy without gangrene: Secondary | ICD-10-CM | POA: Diagnosis not present

## 2017-11-13 DIAGNOSIS — I70235 Atherosclerosis of native arteries of right leg with ulceration of other part of foot: Secondary | ICD-10-CM | POA: Insufficient documentation

## 2017-11-13 DIAGNOSIS — T82856A Stenosis of peripheral vascular stent, initial encounter: Secondary | ICD-10-CM | POA: Insufficient documentation

## 2017-11-13 DIAGNOSIS — M79671 Pain in right foot: Secondary | ICD-10-CM | POA: Diagnosis present

## 2017-11-13 HISTORY — PX: ABDOMINAL AORTOGRAM: CATH118222

## 2017-11-13 HISTORY — PX: LOWER EXTREMITY ANGIOGRAPHY: CATH118251

## 2017-11-13 HISTORY — PX: PERIPHERAL VASCULAR INTERVENTION: CATH118257

## 2017-11-13 LAB — POCT I-STAT, CHEM 8
BUN: 22 mg/dL — AB (ref 6–20)
CALCIUM ION: 1.16 mmol/L (ref 1.15–1.40)
CREATININE: 0.9 mg/dL (ref 0.61–1.24)
Chloride: 104 mmol/L (ref 101–111)
Glucose, Bld: 184 mg/dL — ABNORMAL HIGH (ref 65–99)
HCT: 49 % (ref 39.0–52.0)
HEMOGLOBIN: 16.7 g/dL (ref 13.0–17.0)
Potassium: 4 mmol/L (ref 3.5–5.1)
Sodium: 140 mmol/L (ref 135–145)
TCO2: 23 mmol/L (ref 22–32)

## 2017-11-13 LAB — POCT ACTIVATED CLOTTING TIME
ACTIVATED CLOTTING TIME: 268 s
ACTIVATED CLOTTING TIME: 279 s
Activated Clotting Time: 219 seconds
Activated Clotting Time: 180 seconds

## 2017-11-13 LAB — GLUCOSE, CAPILLARY: Glucose-Capillary: 137 mg/dL — ABNORMAL HIGH (ref 65–99)

## 2017-11-13 SURGERY — ABDOMINAL AORTOGRAM
Anesthesia: LOCAL | Laterality: Right

## 2017-11-13 MED ORDER — ASPIRIN 325 MG PO TABS: 325.0000 mg | ORAL_TABLET | ORAL | Status: DC

## 2017-11-13 MED ORDER — OXYCODONE-ACETAMINOPHEN 5-325 MG PO TABS
ORAL_TABLET | ORAL | Status: AC
  Filled 2017-11-13: qty 1

## 2017-11-13 MED ORDER — LABETALOL HCL 5 MG/ML IV SOLN: 10.0000 mg | INTRAVENOUS | Status: DC | PRN

## 2017-11-13 MED ORDER — LIDOCAINE HCL (PF) 1 % IJ SOLN
INTRAMUSCULAR | Status: AC
  Filled 2017-11-13: qty 30

## 2017-11-13 MED ORDER — HEPARIN (PORCINE) IN NACL 2-0.9 UNIT/ML-% IJ SOLN
INTRAMUSCULAR | Status: AC | PRN
  Administered 2017-11-13: 1000 mL

## 2017-11-13 MED ORDER — CLOPIDOGREL BISULFATE 75 MG PO TABS
75.0000 mg | ORAL_TABLET | ORAL | Status: AC
  Administered 2017-11-13: 75 mg via ORAL

## 2017-11-13 MED ORDER — SODIUM CHLORIDE 0.9 % IV SOLN
INTRAVENOUS | Status: DC
  Administered 2017-11-13: 10:00:00 via INTRAVENOUS

## 2017-11-13 MED ORDER — LIDOCAINE HCL (PF) 1 % IJ SOLN
INTRAMUSCULAR | Status: DC | PRN
  Administered 2017-11-13: 20 mL

## 2017-11-13 MED ORDER — OXYCODONE HCL 5 MG PO TABS
ORAL_TABLET | ORAL | Status: AC
  Filled 2017-11-13: qty 2

## 2017-11-13 MED ORDER — HEPARIN SODIUM (PORCINE) 1000 UNIT/ML IJ SOLN
INTRAMUSCULAR | Status: AC
  Filled 2017-11-13: qty 1

## 2017-11-13 MED ORDER — OXYCODONE-ACETAMINOPHEN 5-325 MG PO TABS
1.0000 | ORAL_TABLET | Freq: Once | ORAL | Status: AC
  Administered 2017-11-13: 1 via ORAL

## 2017-11-13 MED ORDER — MIDAZOLAM HCL 2 MG/2ML IJ SOLN
INTRAMUSCULAR | Status: DC | PRN
  Administered 2017-11-13: 1 mg via INTRAVENOUS

## 2017-11-13 MED ORDER — OXYCODONE HCL 5 MG PO TABS
5.0000 mg | ORAL_TABLET | ORAL | Status: DC | PRN
  Administered 2017-11-13: 10 mg via ORAL

## 2017-11-13 MED ORDER — SODIUM CHLORIDE 0.9 % IV SOLN: INTRAVENOUS | Status: AC

## 2017-11-13 MED ORDER — SODIUM CHLORIDE 0.9% FLUSH: 3.0000 mL | Freq: Two times a day (BID) | INTRAVENOUS | Status: DC

## 2017-11-13 MED ORDER — SODIUM CHLORIDE 0.9% FLUSH: 3.0000 mL | INTRAVENOUS | Status: DC | PRN

## 2017-11-13 MED ORDER — ASPIRIN EC 325 MG PO TBEC
DELAYED_RELEASE_TABLET | ORAL | Status: AC
  Administered 2017-11-13: 325 mg
  Filled 2017-11-13: qty 1

## 2017-11-13 MED ORDER — MORPHINE SULFATE (PF) 4 MG/ML IV SOLN
INTRAVENOUS | Status: AC
  Filled 2017-11-13: qty 1

## 2017-11-13 MED ORDER — FENTANYL CITRATE (PF) 100 MCG/2ML IJ SOLN
INTRAMUSCULAR | Status: DC | PRN
  Administered 2017-11-13: 50 ug via INTRAVENOUS

## 2017-11-13 MED ORDER — COLCHICINE 0.6 MG PO TABS: 0.6000 mg | ORAL_TABLET | Freq: Every day | ORAL | 2 refills | Status: DC

## 2017-11-13 MED ORDER — HEPARIN SODIUM (PORCINE) 1000 UNIT/ML IJ SOLN
INTRAMUSCULAR | Status: DC | PRN
  Administered 2017-11-13: 5000 [IU] via INTRAVENOUS
  Administered 2017-11-13: 8000 [IU] via INTRAVENOUS

## 2017-11-13 MED ORDER — IODIXANOL 320 MG/ML IV SOLN
INTRAVENOUS | Status: DC | PRN
  Administered 2017-11-13: 250 mL via INTRA_ARTERIAL

## 2017-11-13 MED ORDER — SODIUM CHLORIDE 0.9 % IV SOLN: 250.0000 mL | INTRAVENOUS | Status: DC | PRN

## 2017-11-13 MED ORDER — HEPARIN (PORCINE) IN NACL 2-0.9 UNIT/ML-% IJ SOLN
INTRAMUSCULAR | Status: AC
  Filled 2017-11-13: qty 1000

## 2017-11-13 MED ORDER — MIDAZOLAM HCL 2 MG/2ML IJ SOLN
INTRAMUSCULAR | Status: AC
  Filled 2017-11-13: qty 2

## 2017-11-13 MED ORDER — CLOPIDOGREL BISULFATE 75 MG PO TABS
ORAL_TABLET | ORAL | Status: AC
  Administered 2017-11-13: 75 mg via ORAL
  Filled 2017-11-13: qty 1

## 2017-11-13 MED ORDER — HYDRALAZINE HCL 20 MG/ML IJ SOLN: 5.0000 mg | INTRAMUSCULAR | Status: DC | PRN

## 2017-11-13 MED ORDER — MORPHINE SULFATE (PF) 10 MG/ML IV SOLN
2.0000 mg | INTRAVENOUS | Status: DC | PRN
  Administered 2017-11-13: 2 mg via INTRAVENOUS

## 2017-11-13 MED ORDER — FENTANYL CITRATE (PF) 100 MCG/2ML IJ SOLN
INTRAMUSCULAR | Status: AC
  Filled 2017-11-13: qty 2

## 2017-11-13 SURGICAL SUPPLY — 29 items
BAG SNAP BAND KOVER 36X36 (MISCELLANEOUS) ×1 IMPLANT
BALLN ADMIRAL INPACT 5X200 (BALLOONS) ×4
BALLN IN.PACT DCB 5X60 (BALLOONS) ×4
BALLN MUSTANG 5.0X40 135 (BALLOONS) ×4
BALLN MUSTANG 5X100X135 (BALLOONS) ×4
BALLOON ADMIRAL INPACT 5X200 (BALLOONS) IMPLANT
BALLOON MUSTANG 5.0X40 135 (BALLOONS) IMPLANT
BALLOON MUSTANG 5X100X135 (BALLOONS) IMPLANT
CATH ANGIO 5F PIGTAIL 65CM (CATHETERS) ×1 IMPLANT
CATH QUICKCROSS .035X135CM (MICROCATHETER) ×1 IMPLANT
CATH STRAIGHT 5FR 65CM (CATHETERS) ×1 IMPLANT
CATH TEMPO 5F RIM 65CM (CATHETERS) ×1 IMPLANT
COVER DOME SNAP 22 D (MISCELLANEOUS) ×1 IMPLANT
COVER PRB 48X5XTLSCP FOLD TPE (BAG) IMPLANT
COVER PROBE 5X48 (BAG) ×4
DCB IN.PACT 5X60 (BALLOONS) IMPLANT
DEVICE TORQUE .025-.038 (MISCELLANEOUS) ×1 IMPLANT
GUIDEWIRE ANGLED .035X260CM (WIRE) ×1 IMPLANT
KIT ENCORE 26 ADVANTAGE (KITS) ×1 IMPLANT
KIT PV (KITS) ×4 IMPLANT
SHEATH HIGHFLEX ANSEL 7FR 55CM (SHEATH) ×1 IMPLANT
SHEATH PINNACLE 5F 10CM (SHEATH) ×1 IMPLANT
STENT INNOVA 6X150X130 (Permanent Stent) ×1 IMPLANT
SYR MEDRAD MARK V 150ML (SYRINGE) ×4 IMPLANT
TRANSDUCER W/STOPCOCK (MISCELLANEOUS) ×4 IMPLANT
TRAY PV CATH (CUSTOM PROCEDURE TRAY) ×4 IMPLANT
WIRE AMPLATZ SS-J .035X180CM (WIRE) ×1 IMPLANT
WIRE BENTSON .035X145CM (WIRE) ×1 IMPLANT
WIRE ROSEN-J .035X260CM (WIRE) ×1 IMPLANT

## 2017-11-13 NOTE — Progress Notes (Signed)
Client c/o toes right foot pain 10/10 and med given

## 2017-11-13 NOTE — Discharge Instructions (Signed)
NO METFORMIN/GLUCOPHAGE FOR 2 DAYS ° ° °Femoral Site Care °Refer to this sheet in the next few weeks. These instructions provide you with information about caring for yourself after your procedure. Your health care provider may also give you more specific instructions. Your treatment has been planned according to current medical practices, but problems sometimes occur. Call your health care provider if you have any problems or questions after your procedure. °What can I expect after the procedure? °After your procedure, it is typical to have the following: °· Bruising at the site that usually fades within 1-2 weeks. °· Blood collecting in the tissue (hematoma) that may be painful to the touch. It should usually decrease in size and tenderness within 1-2 weeks. ° °Follow these instructions at home: °· Take medicines only as directed by your health care provider. °· You may shower 24-48 hours after the procedure or as directed by your health care provider. Remove the bandage (dressing) and gently wash the site with plain soap and water. Pat the area dry with a clean towel. Do not rub the site, because this may cause bleeding. °· Do not take baths, swim, or use a hot tub until your health care provider approves. °· Check your insertion site every day for redness, swelling, or drainage. °· Do not apply powder or lotion to the site. °· Limit use of stairs to twice a day for the first 2-3 days or as directed by your health care provider. °· Do not squat for the first 2-3 days or as directed by your health care provider. °· Do not lift over 10 lb (4.5 kg) for 5 days after your procedure or as directed by your health care provider. °· Ask your health care provider when it is okay to: °? Return to work or school. °? Resume usual physical activities or sports. °? Resume sexual activity. °· Do not drive home if you are discharged the same day as the procedure. Have someone else drive you. °· You may drive 24 hours after the  procedure unless otherwise instructed by your health care provider. °· Do not operate machinery or power tools for 24 hours after the procedure or as directed by your health care provider. °· If your procedure was done as an outpatient procedure, which means that you went home the same day as your procedure, a responsible adult should be with you for the first 24 hours after you arrive home. °· Keep all follow-up visits as directed by your health care provider. This is important. °Contact a health care provider if: °· You have a fever. °· You have chills. °· You have increased bleeding from the site. Hold pressure on the site. °Get help right away if: °· You have unusual pain at the site. °· You have redness, warmth, or swelling at the site. °· You have drainage (other than a small amount of blood on the dressing) from the site. °· The site is bleeding, and the bleeding does not stop after 30 minutes of holding steady pressure on the site. °· Your leg or foot becomes pale, cool, tingly, or numb. °This information is not intended to replace advice given to you by your health care provider. Make sure you discuss any questions you have with your health care provider. °Document Released: 06/16/2014 Document Revised: 03/20/2016 Document Reviewed: 05/02/2014 °Elsevier Interactive Patient Education © 2018 [employer] ° °

## 2017-11-13 NOTE — Interval H&P Note (Signed)
History and Physical Interval Note:  11/13/2017 10:43 AM  [name]  has presented today for surgery, with the diagnosis of pad  The various methods of treatment have been discussed with the patient and family. After consideration of risks, benefits and other options for treatment, the patient has consented to  Procedure(s): ABDOMINAL AORTOGRAM W/LOWER EXTREMITY (N/A) as a surgical intervention .  The patient's history has been reviewed, patient examined, no change in status, stable for surgery.  I have reviewed the patient's chart and labs.  Questions were answered to the patient's satisfaction.     Ruta Hinds

## 2017-11-13 NOTE — Op Note (Signed)
Procedure: Abdominal aortogram with bilateral lower extremity runoff, right superficial femoral artery stent (6 x 150 Inova self-expanding), right popliteal artery angioplasty drug-coated balloon (Impact)  Preoperative diagnosis: Wrist pain right foot  Postoperative diagnosis: Same  Anesthesia: Local with IV sedation  Operative findings: #1 in-stent restenosis right superficial femoral artery stent 90% angioplastied to 0% residual stenosis   #2 diffuse subtotal occlusion proximal right superficial femoral artery angioplastied with drug cutting balloon with subsequent dissection treated with 6 x 150 Inova self-expanding stent   #3 angioplasty right above-knee popliteal artery 5 x 60 Impact drug-coated balloon  Operative details: After obtaining informed consent, the patient was taken to the [facility].  The patient was placed in supine position on the Angio table.  Both groins were prepped and draped in usual sterile fashion.  Local anesthesia was infiltrated over the left common femoral artery.  Ultrasound was used to identify the left common femoral artery and femoral bifurcation.  An introducer needle was used to cannulate the left common femoral artery and an 035 versacore wire threaded up in the abdominal aorta under fluoroscopic guidance.  A 5 French sheath was then placed over the guidewire and left common femoral artery.  A 5 French pigtail catheter was advanced over the guidewire into the abdominal aorta and abdominal aortogram obtained in AP projection.  This shows the left and right renal arteries are patent.  The infrarenal abdominal aorta is patent.  The left and right common external and internal iliac arteries are patent.  The left and right internal iliac arteries have multiple segments of 50-70% stenosis.  Next the pigtail catheter was pulled down above the aortic bifurcation and bilateral oblique views of the pelvis were obtained with magnification.  This showed no significant  flow-limiting lesion in the external iliac or common iliac arteries.  At this point bilateral lower extremity runoff views were obtained through the pigtail catheter.  In the left lower extremity, the left common femoral and profunda femoris is patent.  There is diffuse disease in the left superficial femoral artery with multiple segments of 50-70% stenosis but it is patent.  Popliteal artery is patent.  There is two-vessel runoff to the left foot via the posterior tibial and anterior tibial artery.  In the right lower extremity, the right common femoral profunda femoris is patent.  Proximal aspect over about 3 cm of the superficial femoral artery is patent.  There is then diffuse narrowing 70-90% over a 7-8 cm segment and also similar findings throughout a right superficial femoral artery stent in the mid thigh.  There is also a 70% stenosis of the above-knee popliteal artery just above the joint space.  There is two-vessel runoff to the right foot by the anterior tibial and posterior tibial artery.  There is some digital artery disease within the right foot.  At this point it was decided to intervene on the superficial femoral and popliteal lesions.  A 5 French crossover catheter was placed over the guidewire after exchanging for the pigtail catheter.  We will selectively catheterize first the right common followed by the right external followed by the right superficial femoral artery.  An 035 Amplatz wire was placed through this and the catheter was removed over the guidewire and exchanged for a 7 French flex sheath.  This was advanced up in a over the aortic bifurcation and down into the proximal right superficial femoral artery.  This was confirmed with angiographic images.  Next an 035 angled Glidewire was advanced across all  of the levels of stenosis in the superficial femoral artery and down to the level of the tibial origins.  A 035 quick cross catheter was placed over this and the Glidewire was  exchanged for an [employer] wire.  The patient was given 8000 units of heparin.  He was also given additional 5000 units of heparin during the course of the case.  I initially proceeded to place a 5 x 100 Mustang balloon within the right superficial femoral artery stent this was inflated to nominal pressure for 1 minute.  There was then an overlapping inflation on the segment of superficial femoral artery above this that was tightly narrowed.  This was also inflated to nominal pressure for 1 minute.  A 5 x 200 Admiral Impact drug-coated balloon was then advanced and centered over the superficial femoral artery stented area as well as the native SFA above this.  Was then inflated to nominal pressure for 3 minutes.  Completion angiogram showed a segment of dissection several centimeters above the prior placed stent and I felt that this was not going to be tacked back down with flow.  Therefore I brought up a 6 x 150 Inova balloon expandable stent and overlapped the previously placed stent and extended the stent up to about within 5 cm of the origin of the right superficial femoral artery.  This was then postdilated with overlapping inflations with the 5 x 100 Mustang balloon.  With this was inflated to nominal pressure for 1 minute each.  Please angiogram showed the stent was widely patent with no further evidence of dissection.  We then proceeded to treat the above-knee popliteal stenosis.  This was about 70%.  A 5 x 40 Mustang balloon was advanced and centered on the leg 1 minute.  I then brought up a 5 x 60 Impact abnormal balloon drug-coated.  This was centered on the popliteal lesion and inflated to nominal pressure for 3 minutes.  Completion angiogram showed a widely patent popliteal artery and intact distal runoff via the posterior tibial anterior tibial arteries.  At this point the sheath was pulled back down into the left hemipelvis.  This was thoroughly flushed with heparinized saline.  The sheath was left in  place to be pulled in the holding area.  The patient tolerated the procedure well and there were no complications.  The patient was taken to the holding area in stable condition.  Operative management: The patient now has a widely patent right superficial femoral artery stent and open popliteal artery with two-vessel runoff in line to the right foot.  This should be adequate for wound healing alleviation of his rest pain.  He will follow-up with [place] in 1 month with a repeat duplex ultrasound and ABIs.  He will need to continue his statin Plavix and aspirin.  Ruta Hinds, MD [facility] [place] [facility] Office: [phone] Pager: [phone]

## 2017-11-13 NOTE — Progress Notes (Signed)
VS prior to sheath pull: BP 171/59, HR 64 SR, RR 14, O2 94% on room air. Left PT pulse obtained by doppler.  Left femoral artery sheath pulled at 1547, hemostasis achieved at 1607. Bedrest to begin at 1610 and last for 5 hours.  Patient experienced some hypotension during sheath pull and a 350 cc normal saline bolus was given. Patient was also placed in Trendelenburg position. VS after sheath pull: BP 151/52, HR 67 and SR, RR 13, O2 94% on room air. Left PT pulse again obtained via doppler. Patient complained of no discomfort or dizziness during hypotensive episode.

## 2017-11-16 ENCOUNTER — Encounter (HOSPITAL_COMMUNITY): Payer: Self-pay | Admitting: Vascular Surgery

## 2017-11-18 ENCOUNTER — Telehealth: Payer: Self-pay | Admitting: Vascular Surgery

## 2017-11-18 NOTE — Telephone Encounter (Signed)
Sched lab 12/18/17 at 3:00 and NP 12/25/17 at 10:15. Spoke to pt to inform them of appts.

## 2017-11-18 NOTE — Telephone Encounter (Signed)
-----   Message from Mena Goes, RN sent at 11/16/2017  9:19 AM EST ----- Regarding: 4 weeks w/ labs and Vinnie Level   ----- Message ----- From: Elam Dutch, MD Sent: 11/13/2017   5:51 PM To: Vvs Charge Pool  Procedure: Abdominal aortogram with bilateral lower extremity runoff, right superficial femoral artery stent (6 x 150 Inova self-expanding), right popliteal artery angioplasty drug-coated balloon (Impact)   He will follow-up with [place] in 1 month with a repeat duplex ultrasound and ABIs.  He can see Vinnie Level at that visit.  He will need to continue his statin Plavix and aspirin.  Ruta Hinds, MD [facility] Office: [phone] Pager: [phone]

## 2017-11-19 ENCOUNTER — Ambulatory Visit: Payer: Medicare HMO | Admitting: Vascular Surgery

## 2017-11-19 DIAGNOSIS — L03115 Cellulitis of right lower limb: Secondary | ICD-10-CM | POA: Diagnosis not present

## 2017-11-19 DIAGNOSIS — E1165 Type 2 diabetes mellitus with hyperglycemia: Secondary | ICD-10-CM | POA: Diagnosis not present

## 2017-11-19 DIAGNOSIS — Z794 Long term (current) use of insulin: Secondary | ICD-10-CM | POA: Diagnosis not present

## 2017-11-19 DIAGNOSIS — E1142 Type 2 diabetes mellitus with diabetic polyneuropathy: Secondary | ICD-10-CM | POA: Diagnosis not present

## 2017-11-19 DIAGNOSIS — L03119 Cellulitis of unspecified part of limb: Secondary | ICD-10-CM | POA: Insufficient documentation

## 2017-11-19 DIAGNOSIS — I10 Essential (primary) hypertension: Secondary | ICD-10-CM | POA: Diagnosis not present

## 2017-11-20 DIAGNOSIS — L03031 Cellulitis of right toe: Secondary | ICD-10-CM | POA: Diagnosis not present

## 2017-11-20 DIAGNOSIS — I739 Peripheral vascular disease, unspecified: Secondary | ICD-10-CM | POA: Diagnosis not present

## 2017-11-20 DIAGNOSIS — E1151 Type 2 diabetes mellitus with diabetic peripheral angiopathy without gangrene: Secondary | ICD-10-CM | POA: Diagnosis not present

## 2017-11-23 ENCOUNTER — Other Ambulatory Visit: Payer: Self-pay

## 2017-11-23 DIAGNOSIS — Z9889 Other specified postprocedural states: Secondary | ICD-10-CM

## 2017-11-23 DIAGNOSIS — I739 Peripheral vascular disease, unspecified: Secondary | ICD-10-CM

## 2017-11-26 DIAGNOSIS — H9193 Unspecified hearing loss, bilateral: Secondary | ICD-10-CM | POA: Diagnosis not present

## 2017-11-26 DIAGNOSIS — K2270 Barrett's esophagus without dysplasia: Secondary | ICD-10-CM | POA: Diagnosis not present

## 2017-11-26 DIAGNOSIS — I7389 Other specified peripheral vascular diseases: Secondary | ICD-10-CM | POA: Diagnosis not present

## 2017-11-26 DIAGNOSIS — Z6826 Body mass index (BMI) 26.0-26.9, adult: Secondary | ICD-10-CM | POA: Diagnosis not present

## 2017-11-26 DIAGNOSIS — I25810 Atherosclerosis of coronary artery bypass graft(s) without angina pectoris: Secondary | ICD-10-CM | POA: Diagnosis not present

## 2017-11-26 DIAGNOSIS — I10 Essential (primary) hypertension: Secondary | ICD-10-CM | POA: Diagnosis not present

## 2017-11-26 DIAGNOSIS — E1151 Type 2 diabetes mellitus with diabetic peripheral angiopathy without gangrene: Secondary | ICD-10-CM | POA: Diagnosis not present

## 2017-11-26 DIAGNOSIS — E7800 Pure hypercholesterolemia, unspecified: Secondary | ICD-10-CM | POA: Diagnosis not present

## 2017-11-26 DIAGNOSIS — I6529 Occlusion and stenosis of unspecified carotid artery: Secondary | ICD-10-CM | POA: Diagnosis not present

## 2017-11-30 DIAGNOSIS — I739 Peripheral vascular disease, unspecified: Secondary | ICD-10-CM | POA: Diagnosis not present

## 2017-11-30 DIAGNOSIS — L03031 Cellulitis of right toe: Secondary | ICD-10-CM | POA: Diagnosis not present

## 2017-11-30 DIAGNOSIS — E1151 Type 2 diabetes mellitus with diabetic peripheral angiopathy without gangrene: Secondary | ICD-10-CM | POA: Diagnosis not present

## 2017-12-10 ENCOUNTER — Ambulatory Visit (HOSPITAL_COMMUNITY)
Admission: RE | Admit: 2017-12-10 | Discharge: 2017-12-10 | Disposition: A | Discharge status: Home / Self Care | Payer: Medicare HMO | Source: Ambulatory Visit | Attending: Vascular Surgery | Admitting: Vascular Surgery

## 2017-12-10 DIAGNOSIS — I739 Peripheral vascular disease, unspecified: Secondary | ICD-10-CM

## 2017-12-10 DIAGNOSIS — Z9889 Other specified postprocedural states: Secondary | ICD-10-CM

## 2017-12-10 DIAGNOSIS — Z95820 Peripheral vascular angioplasty status with implants and grafts: Secondary | ICD-10-CM | POA: Diagnosis not present

## 2017-12-11 ENCOUNTER — Other Ambulatory Visit: Payer: Self-pay | Admitting: Vascular Surgery

## 2017-12-15 ENCOUNTER — Other Ambulatory Visit: Payer: Self-pay | Admitting: *Deleted

## 2017-12-16 ENCOUNTER — Other Ambulatory Visit: Payer: Self-pay | Admitting: *Deleted

## 2017-12-17 DIAGNOSIS — B351 Tinea unguium: Secondary | ICD-10-CM | POA: Diagnosis not present

## 2017-12-17 DIAGNOSIS — E1151 Type 2 diabetes mellitus with diabetic peripheral angiopathy without gangrene: Secondary | ICD-10-CM | POA: Diagnosis not present

## 2017-12-17 DIAGNOSIS — L97519 Non-pressure chronic ulcer of other part of right foot with unspecified severity: Secondary | ICD-10-CM | POA: Diagnosis not present

## 2017-12-17 DIAGNOSIS — I739 Peripheral vascular disease, unspecified: Secondary | ICD-10-CM | POA: Diagnosis not present

## 2017-12-18 ENCOUNTER — Encounter (HOSPITAL_COMMUNITY): Payer: Medicare HMO

## 2017-12-22 DIAGNOSIS — K2270 Barrett's esophagus without dysplasia: Secondary | ICD-10-CM | POA: Diagnosis not present

## 2017-12-25 ENCOUNTER — Encounter: Payer: Self-pay | Admitting: Family

## 2017-12-25 ENCOUNTER — Ambulatory Visit: Payer: Medicare HMO | Admitting: Family

## 2017-12-25 VITALS — BP 174/71 | HR 63 | Resp 18 | Ht 71.0 in | Wt 190.8 lb

## 2017-12-25 DIAGNOSIS — Z95828 Presence of other vascular implants and grafts: Secondary | ICD-10-CM

## 2017-12-25 DIAGNOSIS — Z9889 Other specified postprocedural states: Secondary | ICD-10-CM

## 2017-12-25 DIAGNOSIS — I779 Disorder of arteries and arterioles, unspecified: Secondary | ICD-10-CM

## 2017-12-25 DIAGNOSIS — I6523 Occlusion and stenosis of bilateral carotid arteries: Secondary | ICD-10-CM

## 2017-12-25 DIAGNOSIS — Z95820 Peripheral vascular angioplasty status with implants and grafts: Secondary | ICD-10-CM

## 2017-12-25 NOTE — Progress Notes (Signed)
Postoperative Visit   History of Present Illness  [name] is a 72 y.o. male who is s/p abdominal aortogram with bilateral lower extremity runoff, right superficial femoral artery stent (6 x 150 Inova self-expanding), right popliteal artery angioplasty drug-coated balloon (Impact) on 11-13-17 by Dr. Oneida Alar for rest pain at right foot.   He had a right CEA years ago, left carotid stent in 2014.   He returns today for a 1 month follow up with a repeat duplex ultrasound and ABIs. He will need to continue his statin Plavix and aspirin.  Pt states he no longer has right calf claudication, he can walk as far as he wants. He also states that the ulcers in his right toes are healing very well since the above procedure.  He is using a foot bath solution prescribed by his podiatrist every other day which is also helping to heal his right toes ulcers. He has rare pain in his right foot now.   He quit smoking in 2003 when he had a CABG.  He has DM, last A1C result was in 2014, at 9.2; pt states the most recent A1C was 8.1 in late January 2019. His endocrinologist is Dr. Tamala Julian in [facility].   His podiatrist is Dr. Catha Gosselin.   The patient is able to complete his activities of daily living.     For VQI Use Only  PRE-ADM LIVING: Home  AMB STATUS: Ambulatory   Past Medical History:  Diagnosis Date  . Arthritis   . CAD (coronary artery disease)   . Carotid artery occlusion   . Diabetes mellitus    fasting 100-120s  . Dizziness   . Dysrhythmia    skips beats  . GERD (gastroesophageal reflux disease)   . History of kidney stones   . Hyperlipidemia   . Hypertension   . IHD (ischemic heart disease)    Prior CABG in 2003  . Normal nuclear stress test June 2012  . Peripheral vascular disease ([place])   . Torn rotator cuff      Past Surgical History:  Procedure Laterality Date  . ABDOMINAL AORTAGRAM N/A 12/21/2013   Procedure: ABDOMINAL Maxcine Ham;  Surgeon:  Elam Dutch, MD;  Location: [facility] CATH LAB;  Service: Cardiovascular;  Laterality: N/A;  . ABDOMINAL AORTOGRAM N/A 11/13/2017   Procedure: ABDOMINAL AORTOGRAM;  Surgeon: Elam Dutch, MD;  Location: [facility] CV LAB;  Service: Cardiovascular;  Laterality: N/A;  . APPENDECTOMY    . ARCH AORTOGRAM  08/26/2013   Procedure: ARCH AORTOGRAM;  Surgeon: Elam Dutch, MD;  Location: [facility] CATH LAB;  Service: Cardiovascular;;  . CARDIAC CATHETERIZATION  05/03/2002   EF 40-45%  . CARDIOVASCULAR STRESS TEST  08/10/2006   EF 65%  . CAROTID ENDARTERECTOMY    . CAROTID STENT INSERTION Left 08/26/2013   Procedure: CAROTID STENT INSERTION;  Surgeon: Elam Dutch, MD;  Location: [facility] CATH LAB;  Service: Cardiovascular;  Laterality: Left;  internal carotid  . CORONARY ARTERY BYPASS GRAFT  04/2002  . DOPPLER ECHOCARDIOGRAPHY  08/12/2002   EF 80-85%  . ENDARTERECTOMY Left 07/20/2013   Procedure: ATTEMPTED ENDARTERECTOMY CAROTID;  Surgeon: Conrad Franklin Farm, MD;  Location: [facility];  Service: Vascular;  Laterality: Left;  . HERNIA REPAIR    . LOWER EXTREMITY ANGIOGRAPHY  11/13/2017   Procedure: Lower Extremity Angiography;  Surgeon: Elam Dutch, MD;  Location: [place] CV LAB;  Service: Cardiovascular;;  . PERIPHERAL VASCULAR INTERVENTION Right 11/13/2017  Procedure: PERIPHERAL VASCULAR INTERVENTION;  Surgeon: Elam Dutch, MD;  Location: [place] CV LAB;  Service: Cardiovascular;  Laterality: Right;  superficial femoral  . SHOULDER SURGERY      Social History   Socioeconomic History  . Marital status: Married    Spouse name: Not on file  . Number of children: Not on file  . Years of education: Not on file  . Highest education level: Not on file  Social Needs  . Financial resource strain: Not on file  . Food insecurity - worry: Not on file  . Food insecurity - inability: Not on file  . Transportation needs - medical: Not on file  . Transportation needs - non-medical: Not on file    Occupational History  . Not on file  Tobacco Use  . Smoking status: Former Smoker    Last attempt to quit: 10/27/2001    Years since quitting: 16.1  . Smokeless tobacco: Never Used  Substance and Sexual Activity  . Alcohol use: No    Alcohol/week: 0.0 oz  . Drug use: No  . Sexual activity: Not on file  Other Topics Concern  . Not on file  Social History Narrative  . Not on file    Allergies  Allergen Reactions  . Lipitor [Atorvastatin Calcium] Other (See Comments)    Muscle ache  . Sulfa Drugs Cross Reactors Other (See Comments)    "raw spots"  . Codeine Rash    Current Outpatient Medications on File Prior to Visit  Medication Sig Dispense Refill  . acetaminophen (TYLENOL) 500 MG tablet Take 1,000 mg by mouth every 6 (six) hours as needed for moderate pain.    Marland Kitchen amLODipine-benazepril (LOTREL) 5-20 MG capsule Take 1 capsule by mouth daily. 90 capsule 3  . aspirin EC 81 MG tablet Take 81 mg by mouth daily.    . clopidogrel (PLAVIX) 75 MG tablet TAKE 1 TABLET BY MOUTH ONCE DAILY 90 tablet 3  . colchicine 0.6 MG tablet Take 1 tablet (0.6 mg total) by mouth daily. For 5 days.  If no improvement call Dr Oneida Alar office. 10 tablet 2  . diclofenac sodium (VOLTAREN) 1 % GEL Place 1 application onto the skin 4 (four) times daily as needed for pain.    Marland Kitchen insulin NPH Human (HUMULIN N,NOVOLIN N) 100 UNIT/ML injection Inject 45 Units into the skin 2 (two) times daily.    . insulin regular (NOVOLIN R,HUMULIN R) 100 units/mL injection Inject 20 Units into the skin 3 (three) times daily after meals.    . metFORMIN (GLUCOPHAGE-XR) 500 MG 24 hr tablet Take 500 mg by mouth 2 (two) times daily with a meal.     . metoprolol tartrate (LOPRESSOR) 50 MG tablet Take 1 tablet (50 mg total) by mouth 2 (two) times daily. 180 tablet 3  . nitroGLYCERIN (NITROSTAT) 0.4 MG SL tablet Place 1 tablet (0.4 mg total) under the tongue every 5 (five) minutes as needed for chest pain. 100 tablet PRN  . omeprazole  (PRILOSEC) 20 MG capsule Take 20 mg by mouth daily.    Marland Kitchen oxyCODONE-acetaminophen (PERCOCET/ROXICET) 5-325 MG tablet Take 1-2 tablets by mouth every 6 (six) hours as needed for severe pain. 30 tablet 0  . rosuvastatin (CRESTOR) 20 MG tablet TAKE ONE TABLET BY MOUTH ONCE DAILY 90 tablet 3  . hydrochlorothiazide (HYDRODIURIL) 25 MG tablet Take 1 tablet (25 mg total) by mouth daily. 90 tablet 3   No current facility-administered medications on file prior to visit.  Physical Examination  Vitals:   12/25/17 1006 12/25/17 1008  BP: (!) 176/75 (!) 174/71  Pulse: 63   Resp: 18   SpO2: 98%   Weight: 190 lb 12.8 oz (86.5 kg)   Height: 5\' 11"  (1.803 m)    Body mass index is 26.61 kg/m.  PHYSICAL EXAMINATION: General: The patient appears his stated age.   HEENT:  No gross abnormalities Pulmonary: Respirations are non-labored Abdomen: Soft and non-tender Musculoskeletal: There are no major deformities.   Neurologic: No focal weakness or paresthesias are detected Skin: Ulcer at right great toe with evidence of contraction and healing. Ulcers at other toes of right foot have healed Psychiatric: The patient has normal affect. Cardiovascular: There is a regular rate and rhythm   Vascular: Vessel Right Left  Radial Palpable Palpable  Aorta Not palpable N/A  Femoral Palpable Palpable  Popliteal Not palpable Not palpable  PT  Palpable not Palpable  DP not Palpable not Palpable    Right foot    Right foot    Right foot    DATA  Right LE Arterial Duplex (12-10-17): Patent right femoral artery stents with no evidence of restenosis.  ABI (Date: 12-10-17):  R:   ABI: 0.97 (was 0.39 on 11-12-17),   PT: mono  DP: mono  TBI:  Second toe pressure: 49  L:   ABI: 0.79 (was 0.64),   PT: mono  DP: mono  TBI: 0.23, toe pressure 32  Significantly improved right ABI at 97% from 39%, also improved left ABI from 64% to 79%. All monophasic waveforms.   Medical  Decision Making  KC SUMMERSON is a 72 y.o. male who presents s/p right superficial femoral artery stent (6 x 150 Inova self-expanding), right popliteal artery angioplasty drug-coated balloon (Impact) on 11-13-17.   He had a right CEA years ago, left carotid stent in 2014.  His right toe ulcers have healed and are healing.  He no longer has claudication sx's in his right leg with walking.  He is receiving podiatry care.  The pt will follow up in 3 months with right LE arterial Duplex, ABI's, and carotid duplex.  I advised pt to notify us if he develops concerns re the circulation in his feet or legs.  Graduated walking program discussed and how to achieve.   He has uncontrolled but improving DM; he quit tobacco use in 2003.   I discussed in depth with the patient the nature of atherosclerosis, and emphasized the importance of maximal medical management including strict control of blood pressure, blood glucose, and lipid levels, obtaining regular exercise, and cessation of smoking.  The patient is aware that without maximal medical management the underlying atherosclerotic disease process will progress, limiting the benefit of any interventions. The patient is currently on a statin.   The patient is currently ASA and Plavix.  Thank you for allowing [place] to participate in this patient's care.  Clemon Chambers, RN, MSN, FNP-C [facility] Office: [phone]  12/25/2017, 10:34 AM  Clinic MD: Donzetta Matters

## 2017-12-25 NOTE — Patient Instructions (Signed)

## 2018-01-04 ENCOUNTER — Telehealth: Payer: Self-pay | Admitting: Vascular Surgery

## 2018-01-04 NOTE — Telephone Encounter (Signed)
Spoke to pt re 04/01/18 appt. OK w/appt

## 2018-01-04 NOTE — Telephone Encounter (Signed)
-----   Message from Viann Fish, NP sent at 12/25/2017  7:12 PM EST ----- Regarding: please add carotid duplex to follow up Dear schedulers, Please add carotid duplex to his follow up in 3 months; at that time I requested ABI's and lower extremity duplex.  Thank you, Vinnie Level

## 2018-01-07 DIAGNOSIS — E1151 Type 2 diabetes mellitus with diabetic peripheral angiopathy without gangrene: Secondary | ICD-10-CM | POA: Diagnosis not present

## 2018-01-07 DIAGNOSIS — I739 Peripheral vascular disease, unspecified: Secondary | ICD-10-CM | POA: Diagnosis not present

## 2018-01-07 DIAGNOSIS — L97519 Non-pressure chronic ulcer of other part of right foot with unspecified severity: Secondary | ICD-10-CM | POA: Diagnosis not present

## 2018-02-10 DIAGNOSIS — K2270 Barrett's esophagus without dysplasia: Secondary | ICD-10-CM | POA: Diagnosis not present

## 2018-02-11 DIAGNOSIS — L97512 Non-pressure chronic ulcer of other part of right foot with fat layer exposed: Secondary | ICD-10-CM | POA: Diagnosis not present

## 2018-02-16 DIAGNOSIS — K2270 Barrett's esophagus without dysplasia: Secondary | ICD-10-CM | POA: Diagnosis not present

## 2018-02-18 ENCOUNTER — Ambulatory Visit: Payer: Medicare HMO | Admitting: Vascular Surgery

## 2018-02-18 ENCOUNTER — Encounter (HOSPITAL_COMMUNITY): Payer: Medicare HMO

## 2018-02-18 DIAGNOSIS — E1165 Type 2 diabetes mellitus with hyperglycemia: Secondary | ICD-10-CM | POA: Diagnosis not present

## 2018-02-18 DIAGNOSIS — Z794 Long term (current) use of insulin: Secondary | ICD-10-CM | POA: Diagnosis not present

## 2018-02-18 DIAGNOSIS — E1142 Type 2 diabetes mellitus with diabetic polyneuropathy: Secondary | ICD-10-CM | POA: Diagnosis not present

## 2018-02-18 DIAGNOSIS — E785 Hyperlipidemia, unspecified: Secondary | ICD-10-CM | POA: Diagnosis not present

## 2018-02-18 DIAGNOSIS — I10 Essential (primary) hypertension: Secondary | ICD-10-CM | POA: Diagnosis not present

## 2018-02-18 DIAGNOSIS — E1140 Type 2 diabetes mellitus with diabetic neuropathy, unspecified: Secondary | ICD-10-CM | POA: Diagnosis not present

## 2018-02-23 ENCOUNTER — Ambulatory Visit: Payer: Medicare HMO | Admitting: Cardiovascular Disease

## 2018-02-23 ENCOUNTER — Encounter: Payer: Self-pay | Admitting: Cardiovascular Disease

## 2018-02-23 VITALS — BP 128/80 | HR 61 | Ht 70.0 in | Wt 191.8 lb

## 2018-02-23 DIAGNOSIS — I10 Essential (primary) hypertension: Secondary | ICD-10-CM

## 2018-02-23 DIAGNOSIS — E7849 Other hyperlipidemia: Secondary | ICD-10-CM | POA: Diagnosis not present

## 2018-02-23 DIAGNOSIS — I6523 Occlusion and stenosis of bilateral carotid arteries: Secondary | ICD-10-CM | POA: Diagnosis not present

## 2018-02-23 DIAGNOSIS — I2510 Atherosclerotic heart disease of native coronary artery without angina pectoris: Secondary | ICD-10-CM | POA: Diagnosis not present

## 2018-02-23 LAB — HEPATIC FUNCTION PANEL
ALT: 22 IU/L (ref 0–44)
AST: 23 IU/L (ref 0–40)
Albumin: 4.2 g/dL (ref 3.5–4.8)
Alkaline Phosphatase: 87 IU/L (ref 39–117)
BILIRUBIN TOTAL: 0.3 mg/dL (ref 0.0–1.2)
BILIRUBIN, DIRECT: 0.12 mg/dL (ref 0.00–0.40)
Total Protein: 7.1 g/dL (ref 6.0–8.5)

## 2018-02-23 LAB — BASIC METABOLIC PANEL
BUN/Creatinine Ratio: 20 (ref 10–24)
BUN: 24 mg/dL (ref 8–27)
CALCIUM: 9.9 mg/dL (ref 8.6–10.2)
CO2: 22 mmol/L (ref 20–29)
Chloride: 103 mmol/L (ref 96–106)
Creatinine, Ser: 1.23 mg/dL (ref 0.76–1.27)
GFR calc non Af Amer: 59 mL/min/{1.73_m2} — ABNORMAL LOW (ref >59)
GFR, EST AFRICAN AMERICAN: 68 mL/min/{1.73_m2} (ref >59)
Glucose: 140 mg/dL — ABNORMAL HIGH (ref 65–99)
Potassium: 4.5 mmol/L (ref 3.5–5.2)
Sodium: 139 mmol/L (ref 134–144)

## 2018-02-23 LAB — LIPID PANEL
CHOL/HDL RATIO: 3.7 ratio (ref 0.0–5.0)
CHOLESTEROL TOTAL: 126 mg/dL (ref 100–199)
HDL: 34 mg/dL — ABNORMAL LOW (ref >39)
LDL Calculated: 72 mg/dL (ref 0–99)
TRIGLYCERIDES: 101 mg/dL (ref 0–149)
VLDL Cholesterol Cal: 20 mg/dL (ref 5–40)

## 2018-02-23 MED ORDER — NITROGLYCERIN 0.4 MG SL SUBL: 0.4000 mg | SUBLINGUAL_TABLET | SUBLINGUAL | 6 refills | Status: DC | PRN

## 2018-02-23 MED ORDER — NITROGLYCERIN 0.4 MG SL SUBL: 0.4000 mg | SUBLINGUAL_TABLET | SUBLINGUAL | 99 refills | Status: DC | PRN

## 2018-02-23 NOTE — Patient Instructions (Signed)
Medication Instructions:  Your physician recommends that you continue on your current medications as directed. Please refer to the Current Medication list given to you today.   Labwork: TODAY - cholesterol, liver panel, basic metabolic panel   Testing/Procedures: None Ordered   Follow-Up: Your physician wants you to follow-up in: 6 months with a PA or Nurse Practitioner on Dr. Elmarie Shiley team. You will receive a reminder letter in the mail two months in advance. If you don't receive a letter, please call our office to schedule the follow-up appointment.   If you need a refill on your cardiac medications before your next appointment, please call your pharmacy.   Thank you for choosing [facility]! Christen Bame, RN [phone]

## 2018-02-23 NOTE — Progress Notes (Signed)
Cardiology Office Note   Date:  02/23/2018   ID:  [name], [name] 10-Sep-1946, MRN [id number]  PCP:  Haywood Pao, MD  Cardiologist: previously Darlin Coco MD , now Mertie Moores, MD   Chief Complaint  Patient presents with  . Coronary Artery Disease  . Hypertension  . Hyperlipidemia   Problem list 1. Coronary artery disease-status post CABG 2003 2. Hyperlipidemia 3. Carotid artery disease-status post left carotid stenting , s/p Right CEA  4. Essential hypertension 5. Diabetes mellitus     [name] is a 72 y.o. male who presents for a six-month follow-up visit  Notes from Dr. Mare Ferrari: . He has a past history of coronary artery disease and had coronary artery bypass graft surgery in 2003. He has a remote history of a right carotid endarterectomy about 18 years ago. He had a left carotid endarterectomy by Dr. Oneida Alar in 2014. He has a history of high blood pressure, diabetes mellitus, and hyperlipidemia. He had a normal nuclear stress test in June 2012 prior to rotator cuff surgery. The patient developed claudication of his right leg  and underwent successful vascular surgery in February 2015. Since last visit he has been doing well with no chest pain or shortness of breath. Since last visit he has retired and has been enjoying retirement. He is no longer experiencing any right calf claudication. He has diabetes followed by Dr. Tamala Julian at [facility]. His most recent A1c was 7.2 Since last visit the patient has had no new cardiac symptoms.  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  He himself lives in a townhouse.  Aug. 18, 2017:  [name] is seen today for the first time. He's a previous patient of Dr. Mare Ferrari. He is followed for coronary artery disease, hyperlipidemia, carotid artery disease, hypertension.  Doing well.   Stays active.   Does lots of woodworking . Walks often    No angina   Feb. 14, 2018; Doing well.  No CP or dyspnea. Not exercising much ,  Avoids salt.   Oct. 1, 2018:  Here for eval of HTN. Still eating some salty food . Avoids hot dogs now .  stopped smoking years ago. As active as he can be,  Has a heel spur  February 23, 2018:  BP hyas been normal blood pressure is a bit high Is avoiding hot dogs  No CP or dyspnea.  Has a left carotid stent, s/p R CEA    Past Medical History:  Diagnosis Date  . Arthritis   . CAD (coronary artery disease)   . Carotid artery occlusion   . Diabetes mellitus    fasting 100-120s  . Dizziness   . Dysrhythmia    skips beats  . GERD (gastroesophageal reflux disease)   . History of kidney stones   . Hyperlipidemia   . Hypertension   . IHD (ischemic heart disease)    Prior CABG in 2003  . Normal nuclear stress test June 2012  . Peripheral vascular disease ([place])   . Torn rotator cuff     Past Surgical History:  Procedure Laterality Date  . ABDOMINAL AORTAGRAM N/A 12/21/2013   Procedure: ABDOMINAL Maxcine Ham;  Surgeon: Elam Dutch, MD;  Location: [facility] CATH LAB;  Service: Cardiovascular;  Laterality: N/A;  . ABDOMINAL AORTOGRAM N/A 11/13/2017   Procedure: ABDOMINAL AORTOGRAM;  Surgeon: Elam Dutch, MD;  Location: [facility]  CV LAB;  Service: Cardiovascular;  Laterality: N/A;  . APPENDECTOMY    . ARCH AORTOGRAM  08/26/2013   Procedure: ARCH AORTOGRAM;  Surgeon: Elam Dutch, MD;  Location: [facility] CATH LAB;  Service: Cardiovascular;;  . CARDIAC CATHETERIZATION  05/03/2002   EF 40-45%  . CARDIOVASCULAR STRESS TEST  08/10/2006   EF 65%  . CAROTID ENDARTERECTOMY    . CAROTID STENT INSERTION Left 08/26/2013   Procedure: CAROTID STENT INSERTION;  Surgeon: Elam Dutch, MD;  Location: [facility] CATH LAB;  Service: Cardiovascular;  Laterality: Left;  internal carotid  . CORONARY ARTERY BYPASS GRAFT  04/2002  . DOPPLER ECHOCARDIOGRAPHY  08/12/2002   EF 80-85%  . ENDARTERECTOMY Left 07/20/2013    Procedure: ATTEMPTED ENDARTERECTOMY CAROTID;  Surgeon: Conrad [place], MD;  Location: [facility];  Service: Vascular;  Laterality: Left;  . HERNIA REPAIR    . LOWER EXTREMITY ANGIOGRAPHY  11/13/2017   Procedure: Lower Extremity Angiography;  Surgeon: Elam Dutch, MD;  Location: [place] CV LAB;  Service: Cardiovascular;;  . PERIPHERAL VASCULAR INTERVENTION Right 11/13/2017   Procedure: PERIPHERAL VASCULAR INTERVENTION;  Surgeon: Elam Dutch, MD;  Location: [facility] CV LAB;  Service: Cardiovascular;  Laterality: Right;  superficial femoral  . SHOULDER SURGERY       Current Outpatient Medications  Medication Sig Dispense Refill  . amLODipine-benazepril (LOTREL) 5-20 MG capsule Take 1 capsule by mouth daily. 90 capsule 3  . aspirin EC 81 MG tablet Take 81 mg by mouth daily.    . clopidogrel (PLAVIX) 75 MG tablet TAKE 1 TABLET BY MOUTH ONCE DAILY 90 tablet 3  . hydrochlorothiazide (HYDRODIURIL) 25 MG tablet Take 1 tablet (25 mg total) by mouth daily. 90 tablet 3  . insulin NPH Human (HUMULIN N,NOVOLIN N) 100 UNIT/ML injection Inject 45 Units into the skin 2 (two) times daily.    . insulin regular (NOVOLIN R,HUMULIN R) 100 units/mL injection Inject 20 Units into the skin 3 (three) times daily after meals.    . metFORMIN (GLUCOPHAGE-XR) 500 MG 24 hr tablet Take 500 mg by mouth 2 (two) times daily with a meal.     . metoprolol tartrate (LOPRESSOR) 50 MG tablet Take 1 tablet (50 mg total) by mouth 2 (two) times daily. 180 tablet 3  . omeprazole (PRILOSEC) 20 MG capsule Take 20 mg by mouth daily.    . rosuvastatin (CRESTOR) 20 MG tablet TAKE ONE TABLET BY MOUTH ONCE DAILY 90 tablet 3  . nitroGLYCERIN (NITROSTAT) 0.4 MG SL tablet Place 1 tablet (0.4 mg total) under the tongue every 5 (five) minutes as needed for chest pain. 25 tablet 6   No current facility-administered medications for this visit.     Allergies:   Lipitor [atorvastatin calcium]; Sulfa drugs cross reactors; and Codeine     Social History:  The patient  reports that he quit smoking about 16 years ago. He has never used smokeless tobacco. He reports that he does not drink alcohol or use drugs.   Family History:  The patient's family history includes Diabetes in his brother, daughter, mother, sister, and sister; Heart attack in his father and mother; Heart disease in his daughter, father, and mother; Hypertension in his daughter, father, and mother.    ROS:  Please see the history of present illness.   Otherwise, review of systems are positive for none.   All other systems are reviewed and negative.   Physical Exam: Blood pressure 128/80, pulse 61, height 5\' 10"  (1.778 m), weight  191 lb 12.8 oz (87 kg), SpO2 98 %.  GEN:  Well nourished, well developed in no acute distress HEENT: Normal NECK: No JVD; No carotid bruits,  Bilateral carotid scars  LYMPHATICS: No lymphadenopathy CARDIAC: RRR  RESPIRATORY:  Clear to auscultation without rales, wheezing or rhonchi  ABDOMEN: Soft, non-tender, non-distended MUSCULOSKELETAL:  No edema; No deformity  SKIN: Warm and dry NEUROLOGIC:  Alert and oriented x 3    EKG:   February 23, 2018: Normal sinus rhythm at 61 beats minute.  Occasional premature ventricular contractions.  Nonspecific IVCD.    Recent Labs: 11/13/2017: Hemoglobin 16.7 02/23/2018: ALT 22; BUN 24; Creatinine, Ser 1.23; Potassium 4.5; Sodium 139    Lipid Panel    Component Value Date/Time   CHOL 126 02/23/2018 1014   TRIG 101 02/23/2018 1014   HDL 34 (L) 02/23/2018 1014   CHOLHDL 3.7 02/23/2018 1014   CHOLHDL 3.2 06/13/2016 0918   VLDL 21 06/13/2016 0918   LDLCALC 72 02/23/2018 1014      Wt Readings from Last 3 Encounters:  02/23/18 191 lb 12.8 oz (87 kg)  12/25/17 190 lb 12.8 oz (86.5 kg)  11/13/17 190 lb (86.2 kg)        ASSESSMENT AND PLAN:  1.  CAD :   S/p CABG , No angina   2. diabetes mellitus insulin-      3. hypertensive heart disease without heart failure.    BP is  well controlled.   cont. Meds.   4. Hyperlipidemia.  Continue meds.   Check labs today   6. Bilateral carotid artery disease:   followed by Dr. Oneida Alar,     Current medicines are reviewed at length with the patient today.  The patient does not have concerns regarding medicines.  The following changes have been made:  no change  Labs/ tests ordered today include:    Orders Placed This Encounter  Procedures  . Lipid Profile  . Basic Metabolic Panel (BMET)  . Hepatic function panel  . EKG 12-Lead   Will have him follow up with APP in 6 months,   I'll see him in 1 year.    Mertie Moores, MD  02/23/2018 6:10 PM    [facility] [address],  [place] [address], [place]  [address] Pager [phone] Phone: [phone]; Fax: [phone]

## 2018-02-24 DIAGNOSIS — E1151 Type 2 diabetes mellitus with diabetic peripheral angiopathy without gangrene: Secondary | ICD-10-CM | POA: Diagnosis not present

## 2018-02-24 DIAGNOSIS — I739 Peripheral vascular disease, unspecified: Secondary | ICD-10-CM | POA: Diagnosis not present

## 2018-02-24 DIAGNOSIS — L97509 Non-pressure chronic ulcer of other part of unspecified foot with unspecified severity: Secondary | ICD-10-CM | POA: Diagnosis not present

## 2018-03-05 DIAGNOSIS — L97512 Non-pressure chronic ulcer of other part of right foot with fat layer exposed: Secondary | ICD-10-CM | POA: Insufficient documentation

## 2018-03-05 DIAGNOSIS — E11621 Type 2 diabetes mellitus with foot ulcer: Secondary | ICD-10-CM | POA: Diagnosis not present

## 2018-03-05 DIAGNOSIS — I2510 Atherosclerotic heart disease of native coronary artery without angina pectoris: Secondary | ICD-10-CM | POA: Diagnosis not present

## 2018-03-05 DIAGNOSIS — I739 Peripheral vascular disease, unspecified: Secondary | ICD-10-CM | POA: Diagnosis not present

## 2018-03-12 DIAGNOSIS — I2510 Atherosclerotic heart disease of native coronary artery without angina pectoris: Secondary | ICD-10-CM | POA: Diagnosis not present

## 2018-03-12 DIAGNOSIS — I739 Peripheral vascular disease, unspecified: Secondary | ICD-10-CM | POA: Diagnosis not present

## 2018-03-12 DIAGNOSIS — L97512 Non-pressure chronic ulcer of other part of right foot with fat layer exposed: Secondary | ICD-10-CM | POA: Diagnosis not present

## 2018-03-12 DIAGNOSIS — E11621 Type 2 diabetes mellitus with foot ulcer: Secondary | ICD-10-CM | POA: Diagnosis not present

## 2018-03-23 DIAGNOSIS — M86371 Chronic multifocal osteomyelitis, right ankle and foot: Secondary | ICD-10-CM | POA: Insufficient documentation

## 2018-03-23 DIAGNOSIS — I739 Peripheral vascular disease, unspecified: Secondary | ICD-10-CM | POA: Diagnosis not present

## 2018-03-23 DIAGNOSIS — L97512 Non-pressure chronic ulcer of other part of right foot with fat layer exposed: Secondary | ICD-10-CM | POA: Diagnosis not present

## 2018-03-23 DIAGNOSIS — E11621 Type 2 diabetes mellitus with foot ulcer: Secondary | ICD-10-CM | POA: Diagnosis not present

## 2018-03-24 ENCOUNTER — Other Ambulatory Visit: Payer: Self-pay

## 2018-03-24 DIAGNOSIS — I6523 Occlusion and stenosis of bilateral carotid arteries: Secondary | ICD-10-CM

## 2018-03-24 DIAGNOSIS — I779 Disorder of arteries and arterioles, unspecified: Secondary | ICD-10-CM

## 2018-03-24 DIAGNOSIS — I739 Peripheral vascular disease, unspecified: Secondary | ICD-10-CM

## 2018-03-24 DIAGNOSIS — Z9889 Other specified postprocedural states: Secondary | ICD-10-CM

## 2018-04-01 ENCOUNTER — Ambulatory Visit (INDEPENDENT_AMBULATORY_CARE_PROVIDER_SITE_OTHER): Payer: Medicare HMO | Admitting: Family

## 2018-04-01 ENCOUNTER — Ambulatory Visit (INDEPENDENT_AMBULATORY_CARE_PROVIDER_SITE_OTHER)
Admission: RE | Admit: 2018-04-01 | Discharge: 2018-04-01 | Disposition: A | Discharge status: Home / Self Care | Payer: Medicare HMO | Source: Ambulatory Visit | Attending: Family | Admitting: Family

## 2018-04-01 ENCOUNTER — Ambulatory Visit (HOSPITAL_COMMUNITY)
Admission: RE | Admit: 2018-04-01 | Discharge: 2018-04-01 | Disposition: A | Discharge status: Home / Self Care | Payer: Medicare HMO | Source: Ambulatory Visit | Attending: Family | Admitting: Family

## 2018-04-01 ENCOUNTER — Encounter: Payer: Self-pay | Admitting: Family

## 2018-04-01 ENCOUNTER — Other Ambulatory Visit: Payer: Self-pay | Admitting: Vascular Surgery

## 2018-04-01 VITALS — BP 146/59 | HR 54 | Temp 97.6°F | Resp 16 | Ht 70.0 in | Wt 189.2 lb

## 2018-04-01 DIAGNOSIS — I779 Disorder of arteries and arterioles, unspecified: Secondary | ICD-10-CM

## 2018-04-01 DIAGNOSIS — Z9889 Other specified postprocedural states: Secondary | ICD-10-CM

## 2018-04-01 DIAGNOSIS — Z87891 Personal history of nicotine dependence: Secondary | ICD-10-CM

## 2018-04-01 DIAGNOSIS — I6523 Occlusion and stenosis of bilateral carotid arteries: Secondary | ICD-10-CM

## 2018-04-01 DIAGNOSIS — E785 Hyperlipidemia, unspecified: Secondary | ICD-10-CM | POA: Diagnosis not present

## 2018-04-01 DIAGNOSIS — Z95820 Peripheral vascular angioplasty status with implants and grafts: Secondary | ICD-10-CM | POA: Insufficient documentation

## 2018-04-01 DIAGNOSIS — I10 Essential (primary) hypertension: Secondary | ICD-10-CM | POA: Diagnosis not present

## 2018-04-01 DIAGNOSIS — E1151 Type 2 diabetes mellitus with diabetic peripheral angiopathy without gangrene: Secondary | ICD-10-CM

## 2018-04-01 DIAGNOSIS — I739 Peripheral vascular disease, unspecified: Secondary | ICD-10-CM

## 2018-04-01 DIAGNOSIS — E119 Type 2 diabetes mellitus without complications: Secondary | ICD-10-CM | POA: Diagnosis not present

## 2018-04-01 NOTE — Patient Instructions (Signed)
Peripheral Vascular Disease Peripheral vascular disease (PVD) is a disease of the blood vessels that are not part of your heart and brain. A simple term for PVD is poor circulation. In most cases, PVD narrows the blood vessels that carry blood from your heart to the rest of your body. This can result in a decreased supply of blood to your arms, legs, and internal organs, like your stomach or kidneys. However, it most often affects a person's lower legs and feet. There are two types of PVD.  Organic PVD. This is the more common type. It is caused by damage to the structure of blood vessels.  Functional PVD. This is caused by conditions that make blood vessels contract and tighten (spasm).  Without treatment, PVD tends to get worse over time. PVD can also lead to acute ischemic limb. This is when an arm or limb suddenly has trouble getting enough blood. This is a medical emergency. Follow these instructions at home:  Take medicines only as told by your doctor.  Do not use any tobacco products, including cigarettes, chewing tobacco, or electronic cigarettes. If you need help quitting, ask your doctor.  Lose weight if you are overweight, and maintain a healthy weight as told by your doctor.  Eat a diet that is low in fat and cholesterol. If you need help, ask your doctor.  Exercise regularly. Ask your doctor for some good activities for you.  Take good care of your feet. ? Wear comfortable shoes that fit well. ? Check your feet often for any cuts or sores. Contact a doctor if:  You have cramps in your legs while walking.  You have leg pain when you are at rest.  You have coldness in a leg or foot.  Your skin changes.  You are unable to get or have an erection (erectile dysfunction).  You have cuts or sores on your feet that are not healing. Get help right away if:  Your arm or leg turns cold and blue.  Your arms or legs become red, warm, swollen, painful, or numb.  You have  chest pain or trouble breathing.  You suddenly have weakness in your face, arm, or leg.  You become very confused or you cannot speak.  You suddenly have a very bad headache.  You suddenly cannot see. This information is not intended to replace advice given to you by your health care provider. Make sure you discuss any questions you have with your health care provider. Document Released: 01/07/2010 Document Revised: 03/20/2016 Document Reviewed: 03/23/2014 Elsevier Interactive Patient Education  2017 [employer] Inc[employer]       Stroke Prevention Some health problems and behaviors may make it more likely for you to have a stroke. Below are ways to lessen your risk of having a stroke.  Be active for at least 30 minutes on most or all days.  Do not smoke. Try not to be around others who smoke.  Do not drink too much alcohol. ? Do not have more than 2 drinks a day if you are a man. ? Do not have more than 1 drink a day if you are a woman and are not pregnant.  Eat healthy foods, such as fruits and vegetables. If you were put on a specific diet, follow the diet as told.  Keep your cholesterol levels under control through diet and medicines. Look for foods that are low in saturated fat, trans fat, cholesterol, and are high in fiber.  If you have diabetes, follow   all diet plans and take your medicine as told.  Ask your doctor if you need treatment to lower your blood pressure. If you have high blood pressure (hypertension), follow all diet plans and take your medicine as told by your doctor.  If you are 18-39 years old, have your blood pressure checked every 3-5 years. If you are age 40 or older, have your blood pressure checked every year.  Keep a healthy weight. Eat foods that are low in calories, salt, saturated fat, trans fat, and cholesterol.  Do not take drugs.  Avoid birth control pills, if this applies. Talk to your doctor about the risks of taking birth control pills.  Talk to  your doctor if you have sleep problems (sleep apnea).  Take all medicine as told by your doctor. ? You may be told to take aspirin or blood thinner medicine. Take this medicine as told by your doctor. ? Understand your medicine instructions.  Make sure any other conditions you have are being taken care of.  Get help right away if:  You suddenly lose feeling (you feel numb) or have weakness in your face, arm, or leg.  Your face or eyelid hangs down to one side.  You suddenly feel confused.  You have trouble talking (aphasia) or understanding what people are saying.  You suddenly have trouble seeing in one or both eyes.  You suddenly have trouble walking.  You are dizzy.  You lose your balance or your movements are clumsy (uncoordinated).  You suddenly have a very bad headache and you do not know the cause.  You have new chest pain.  Your heart feels like it is fluttering or skipping a beat (irregular heartbeat). Do not wait to see if the symptoms above go away. Get help right away. Call your local emergency services (911 in [place] Do not drive yourself to the hospital. This information is not intended to replace advice given to you by your health care provider. Make sure you discuss any questions you have with your health care provider. Document Released: 04/13/2012 Document Revised: 03/20/2016 Document Reviewed: 04/15/2013 Elsevier Interactive Patient Education  2018 Elsevier Inc.  

## 2018-04-01 NOTE — Progress Notes (Signed)
[facility] [place]   CC: Follow up peripheral artery occlusive disease  History of Present Illness [name] is a 72 y.o. male who is s/p abdominal aortogram with bilateral lower extremity runoff, right superficial femoral artery stent (6 x 150 Inova self-expanding), right popliteal artery angioplasty drug-coated balloon (Impact) on 11-13-17 by Dr. Oneida Alar for rest pain at right foot.   He had a right CEA years ago, left carotid stent in 2014.  He denies any known stroke or TIA.   He has not had any revascularization procedures on his left LE, he has had a vein harvested from his left lower leg for his CABG.  He returned on 12-25-17 for a 1 month follow up with a repeat duplex ultrasound and ABIs. He will need to continue his statin Plavix and aspirin.  Pt states he no longer has right calf claudication, he can walk as far as he wants. He also states that the ulcers in his right toes are healing very well since the above procedure.   He denies pain or weakness in his legs with walking, denies pain in his feet.   His podiatrist is Dr. Catha Gosselin.  He sees Dr. Olevia Perches in [facility] for wound care to his right foot.    Diabetic: Yes, last A1C result was in 2014, at 9.2; pt states the most recent A1C was 7.? in about March 2019. His endocrinologist is Dr. Tamala Julian in [facility].  Tobacco use: former smoker, quit smoking in 2003 when he had a CABG.   Pt meds include: Statin :Yes Betablocker: Yes ASA: Yes Other anticoagulants/antiplatelets: Plavix  Past Medical History:  Diagnosis Date  . Arthritis   . CAD (coronary artery disease)   . Carotid artery occlusion   . Diabetes mellitus    fasting 100-120s  . Dizziness   . Dysrhythmia    skips beats  . GERD (gastroesophageal reflux disease)   . History of kidney stones   . Hyperlipidemia   . Hypertension   . IHD (ischemic heart disease)    Prior CABG in 2003  . Normal nuclear stress test  June 2012  . Peripheral vascular disease ([place])   . Torn rotator cuff     Social History Social History   Tobacco Use  . Smoking status: Former Smoker    Last attempt to quit: 10/27/2001    Years since quitting: 16.4  . Smokeless tobacco: Never Used  Substance Use Topics  . Alcohol use: No    Alcohol/week: 0.0 oz  . Drug use: No    Family History Family History  Problem Relation Age of Onset  . Heart attack Mother   . Diabetes Mother   . Heart disease Mother   . Hypertension Mother   . Heart attack Father   . Heart disease Father   . Hypertension Father   . Diabetes Brother   . Diabetes Sister   . Diabetes Daughter   . Heart disease Daughter   . Hypertension Daughter   . Diabetes Sister     Past Surgical History:  Procedure Laterality Date  . ABDOMINAL AORTAGRAM N/A 12/21/2013   Procedure: ABDOMINAL Maxcine Ham;  Surgeon: Elam Dutch, MD;  Location: [facility] CATH LAB;  Service: Cardiovascular;  Laterality: N/A;  . ABDOMINAL AORTOGRAM N/A 11/13/2017   Procedure: ABDOMINAL AORTOGRAM;  Surgeon: Elam Dutch, MD;  Location: [place] CV LAB;  Service: Cardiovascular;  Laterality: N/A;  . APPENDECTOMY    . ARCH  AORTOGRAM  08/26/2013   Procedure: ARCH AORTOGRAM;  Surgeon: Elam Dutch, MD;  Location: [facility] CATH LAB;  Service: Cardiovascular;;  . CARDIAC CATHETERIZATION  05/03/2002   EF 40-45%  . CARDIOVASCULAR STRESS TEST  08/10/2006   EF 65%  . CAROTID ENDARTERECTOMY    . CAROTID STENT INSERTION Left 08/26/2013   Procedure: CAROTID STENT INSERTION;  Surgeon: Elam Dutch, MD;  Location: [facility] CATH LAB;  Service: Cardiovascular;  Laterality: Left;  internal carotid  . CORONARY ARTERY BYPASS GRAFT  04/2002  . DOPPLER ECHOCARDIOGRAPHY  08/12/2002   EF 80-85%  . ENDARTERECTOMY Left 07/20/2013   Procedure: ATTEMPTED ENDARTERECTOMY CAROTID;  Surgeon: Conrad Dames Quarter, MD;  Location: [place];  Service: Vascular;  Laterality: Left;  . HERNIA REPAIR    . LOWER EXTREMITY  ANGIOGRAPHY  11/13/2017   Procedure: Lower Extremity Angiography;  Surgeon: Elam Dutch, MD;  Location: [place] CV LAB;  Service: Cardiovascular;;  . PERIPHERAL VASCULAR INTERVENTION Right 11/13/2017   Procedure: PERIPHERAL VASCULAR INTERVENTION;  Surgeon: Elam Dutch, MD;  Location: [place] CV LAB;  Service: Cardiovascular;  Laterality: Right;  superficial femoral  . SHOULDER SURGERY      Allergies  Allergen Reactions  . Lipitor [Atorvastatin Calcium] Other (See Comments)    Muscle ache  . Sulfa Drugs Cross Reactors Other (See Comments)    "raw spots"  . Codeine Rash    Current Outpatient Medications  Medication Sig Dispense Refill  . amLODipine-benazepril (LOTREL) 5-20 MG capsule Take 1 capsule by mouth daily. 90 capsule 3  . aspirin EC 81 MG tablet Take 81 mg by mouth daily.    . clopidogrel (PLAVIX) 75 MG tablet TAKE 1 TABLET BY MOUTH ONCE DAILY 90 tablet 3  . insulin NPH Human (HUMULIN N,NOVOLIN N) 100 UNIT/ML injection Inject 45 Units into the skin 2 (two) times daily.    . insulin regular (NOVOLIN R,HUMULIN R) 100 units/mL injection Inject 20 Units into the skin 3 (three) times daily after meals.    . metFORMIN (GLUCOPHAGE-XR) 500 MG 24 hr tablet Take 500 mg by mouth 2 (two) times daily with a meal.     . metoprolol tartrate (LOPRESSOR) 50 MG tablet Take 1 tablet (50 mg total) by mouth 2 (two) times daily. 180 tablet 3  . nitroGLYCERIN (NITROSTAT) 0.4 MG SL tablet Place 1 tablet (0.4 mg total) under the tongue every 5 (five) minutes as needed for chest pain. 25 tablet 6  . omeprazole (PRILOSEC) 20 MG capsule Take 20 mg by mouth daily.    . rosuvastatin (CRESTOR) 20 MG tablet TAKE ONE TABLET BY MOUTH ONCE DAILY 90 tablet 3  . hydrochlorothiazide (HYDRODIURIL) 25 MG tablet Take 1 tablet (25 mg total) by mouth daily. 90 tablet 3   No current facility-administered medications for this visit.     ROS: See HPI for pertinent positives and negatives.   Physical  Examination  Vitals:   04/01/18 1019 04/01/18 1020  BP: (!) 161/68 (!) 146/59  Pulse: (!) 54   Resp: 16   Temp: 97.6 F (36.4 C)   TempSrc: Oral   SpO2: 98%   Weight: 189 lb 3.2 oz (85.8 kg)   Height: 5\' 10"  (1.778 m)    Body mass index is 27.15 kg/m.  General: A&O x 3, WDWN, male. Gait: normal HENT: No gross abnormalities.  Eyes: PERRLA. Pulmonary: Respirations are non labored, CTAB, good air movement Cardiac: regular rhythm, no detected murmur.  Carotid Bruits Right Left   Negative Negative   Radial pulses are 2+ palpable bilaterally   Adominal aortic pulse is not palpable                               VASCULAR EXAM: Extremities without ischemic changes, without Gangrene; without open wounds. Healed right great toe wound.                                                                                                           LE Pulses Right Left       FEMORAL  2+ palpable  2+ palpable        POPLITEAL  not palpable   not palpable       POSTERIOR TIBIAL  faintly palpable   1+ palpable        DORSALIS PEDIS      ANTERIOR TIBIAL not palpable  not palpable    Abdomen: soft, NT, no palpable masses. Skin: no rashes, no cellulitis, no ulcers noted. Musculoskeletal: no muscle wasting or atrophy.  Neurologic: A&O X 3; appropriate affect, Sensation is normal; MOTOR FUNCTION:  moving all extremities equally, motor strength 5/5 throughout. Speech is fluent/normal. CN 2-12 intact. Psychiatric: Thought content is normal, mood appropriate for clinical situation.     ASSESSMENT: [name] is a 72 y.o. male who is s/p right superficial femoral artery stent (6 x 150 Inova self-expanding), right popliteal artery angioplasty drug-coated balloon (Impact) on 11-13-17.   He had a right CEA years ago, left carotid stent in 2014. He has no hx of stroke or TIA.   I spoke with Dr. Oneida Alar re 306 cm/s in left carotid mid stent and 340 cm/s PSV at left distal SFA.    Pt has no claudication sx's and his right great toe wound is mostly healed.  See Plan.   Is atherosclerotic risk factors include uncontrolled DM for a long time until recently, and smoking until 2003. He takes a daily statin, ASA, and Plavix.     DATA  Carotid Duplex (04/01/18): Right ICA: CEA site with 1-39% stenosis Left ICA: >70% stenosis of the mid to distal stent (306 cm/s mid stent) (was 324 cm/s on 02-12-17) Left ECA is not visualized.  Bilateral vertebral artery flow is antegrade.  Bilateral subclavian artery waveforms are normal.  No significant change compared to the exam on 02-12-17.    Bilateral LE Arterial Duplex (04/01/18): Right: stent with no stenosis, all biphasic waveforms; monophasic at peroneal.  Left: Multilevel arterial occlusive diease in the proximal, mid, and distal (340 cm/s) SFA with probable tibial artery occlusive disease. Biphasic waveforms to mid popliteal, monophasic at ATA distal to peroneal distal.     ABI (Date: 04/01/2018):  R:   ABI: 0.97 (was 0.97 on 02-12-17),   PT: mono  DP: mono  TBI:  Not performed due to bandage in place  L:   ABI: 0.72 (was 0.79),   PT: mono  DP: mono  TBI: 0.12 (was  0.23) Right ABI of 0.97 does not correlate to monophasic waveforms, likely falsely elevated due to calcified vessels.  Left ABI of 0.79 may be falsely elevated, monophasic waveforms. Left TBI not performed due to presence of bandage which was later removed. Right TBI Iis very low at 0.12.    PLAN:  Based on the patient's vascular studies and examination, pt will return to clinic in 3 months with ABI's and bilateral LE arterial duplex.  Graduated walking program discussed and how to achieve. In addition, daily seated leg exercised discussed and demonstrated.  I advised pt to notify us if he develops concerns re the circulation in his feet or legs.  Carotid duplex in 6 months.   I discussed in depth with the patient the nature of  atherosclerosis, and emphasized the importance of maximal medical management including strict control of blood pressure, blood glucose, and lipid levels, obtaining regular exercise, and continued cessation of smoking.  The patient is aware that without maximal medical management the underlying atherosclerotic disease process will progress, limiting the benefit of any interventions.  The patient was given information about PAD including signs, symptoms, treatment, what symptoms should prompt the patient to seek immediate medical care, and risk reduction measures to take.  Clemon Chambers, RN, MSN, FNP-C [facility] [employer] Phone: [phone]  Clinic MD: [facility]  04/01/18 10:24 AM

## 2018-04-05 ENCOUNTER — Other Ambulatory Visit: Payer: Self-pay

## 2018-04-05 DIAGNOSIS — Z9889 Other specified postprocedural states: Secondary | ICD-10-CM

## 2018-04-05 DIAGNOSIS — I779 Disorder of arteries and arterioles, unspecified: Secondary | ICD-10-CM

## 2018-04-05 DIAGNOSIS — I739 Peripheral vascular disease, unspecified: Secondary | ICD-10-CM

## 2018-04-06 DIAGNOSIS — M86371 Chronic multifocal osteomyelitis, right ankle and foot: Secondary | ICD-10-CM | POA: Diagnosis not present

## 2018-04-06 DIAGNOSIS — I739 Peripheral vascular disease, unspecified: Secondary | ICD-10-CM | POA: Diagnosis not present

## 2018-04-06 DIAGNOSIS — L97512 Non-pressure chronic ulcer of other part of right foot with fat layer exposed: Secondary | ICD-10-CM | POA: Diagnosis not present

## 2018-04-06 DIAGNOSIS — E11621 Type 2 diabetes mellitus with foot ulcer: Secondary | ICD-10-CM | POA: Diagnosis not present

## 2018-04-15 DIAGNOSIS — B351 Tinea unguium: Secondary | ICD-10-CM | POA: Diagnosis not present

## 2018-04-15 DIAGNOSIS — E1151 Type 2 diabetes mellitus with diabetic peripheral angiopathy without gangrene: Secondary | ICD-10-CM | POA: Diagnosis not present

## 2018-04-20 DIAGNOSIS — L97512 Non-pressure chronic ulcer of other part of right foot with fat layer exposed: Secondary | ICD-10-CM | POA: Diagnosis not present

## 2018-04-20 DIAGNOSIS — M86371 Chronic multifocal osteomyelitis, right ankle and foot: Secondary | ICD-10-CM | POA: Diagnosis not present

## 2018-04-20 DIAGNOSIS — I739 Peripheral vascular disease, unspecified: Secondary | ICD-10-CM | POA: Diagnosis not present

## 2018-04-20 DIAGNOSIS — M79674 Pain in right toe(s): Secondary | ICD-10-CM | POA: Diagnosis not present

## 2018-04-20 DIAGNOSIS — E11621 Type 2 diabetes mellitus with foot ulcer: Secondary | ICD-10-CM | POA: Diagnosis not present

## 2018-05-07 DIAGNOSIS — I739 Peripheral vascular disease, unspecified: Secondary | ICD-10-CM | POA: Diagnosis not present

## 2018-05-07 DIAGNOSIS — E11621 Type 2 diabetes mellitus with foot ulcer: Secondary | ICD-10-CM | POA: Diagnosis not present

## 2018-05-07 DIAGNOSIS — M86371 Chronic multifocal osteomyelitis, right ankle and foot: Secondary | ICD-10-CM | POA: Diagnosis not present

## 2018-05-07 DIAGNOSIS — L97512 Non-pressure chronic ulcer of other part of right foot with fat layer exposed: Secondary | ICD-10-CM | POA: Diagnosis not present

## 2018-05-26 DIAGNOSIS — E785 Hyperlipidemia, unspecified: Secondary | ICD-10-CM | POA: Diagnosis not present

## 2018-05-26 DIAGNOSIS — E1142 Type 2 diabetes mellitus with diabetic polyneuropathy: Secondary | ICD-10-CM | POA: Diagnosis not present

## 2018-05-26 DIAGNOSIS — E1165 Type 2 diabetes mellitus with hyperglycemia: Secondary | ICD-10-CM | POA: Diagnosis not present

## 2018-05-26 DIAGNOSIS — Z794 Long term (current) use of insulin: Secondary | ICD-10-CM | POA: Diagnosis not present

## 2018-05-26 DIAGNOSIS — I10 Essential (primary) hypertension: Secondary | ICD-10-CM | POA: Diagnosis not present

## 2018-06-01 DIAGNOSIS — K2270 Barrett's esophagus without dysplasia: Secondary | ICD-10-CM | POA: Diagnosis not present

## 2018-06-01 DIAGNOSIS — I7389 Other specified peripheral vascular diseases: Secondary | ICD-10-CM | POA: Diagnosis not present

## 2018-06-01 DIAGNOSIS — Z6827 Body mass index (BMI) 27.0-27.9, adult: Secondary | ICD-10-CM | POA: Diagnosis not present

## 2018-06-01 DIAGNOSIS — E7800 Pure hypercholesterolemia, unspecified: Secondary | ICD-10-CM | POA: Diagnosis not present

## 2018-06-01 DIAGNOSIS — I25810 Atherosclerosis of coronary artery bypass graft(s) without angina pectoris: Secondary | ICD-10-CM | POA: Diagnosis not present

## 2018-06-01 DIAGNOSIS — E1151 Type 2 diabetes mellitus with diabetic peripheral angiopathy without gangrene: Secondary | ICD-10-CM | POA: Diagnosis not present

## 2018-06-01 DIAGNOSIS — I10 Essential (primary) hypertension: Secondary | ICD-10-CM | POA: Diagnosis not present

## 2018-06-01 DIAGNOSIS — I6529 Occlusion and stenosis of unspecified carotid artery: Secondary | ICD-10-CM | POA: Diagnosis not present

## 2018-06-01 DIAGNOSIS — H9193 Unspecified hearing loss, bilateral: Secondary | ICD-10-CM | POA: Diagnosis not present

## 2018-06-11 DIAGNOSIS — E11319 Type 2 diabetes mellitus with unspecified diabetic retinopathy without macular edema: Secondary | ICD-10-CM | POA: Diagnosis not present

## 2018-07-08 ENCOUNTER — Ambulatory Visit: Payer: Medicare HMO | Admitting: Family

## 2018-07-08 ENCOUNTER — Ambulatory Visit (HOSPITAL_COMMUNITY)
Admission: RE | Admit: 2018-07-08 | Discharge: 2018-07-08 | Disposition: A | Discharge status: Home / Self Care | Payer: Medicare HMO | Source: Ambulatory Visit | Attending: Family | Admitting: Family

## 2018-07-08 ENCOUNTER — Encounter: Payer: Self-pay | Admitting: Family

## 2018-07-08 ENCOUNTER — Ambulatory Visit (INDEPENDENT_AMBULATORY_CARE_PROVIDER_SITE_OTHER)
Admission: RE | Admit: 2018-07-08 | Discharge: 2018-07-08 | Disposition: A | Discharge status: Home / Self Care | Payer: Medicare HMO | Source: Ambulatory Visit | Attending: Family | Admitting: Family

## 2018-07-08 ENCOUNTER — Other Ambulatory Visit (HOSPITAL_COMMUNITY): Payer: Medicare HMO

## 2018-07-08 ENCOUNTER — Other Ambulatory Visit: Payer: Self-pay

## 2018-07-08 VITALS — BP 128/72 | HR 58 | Temp 98.2°F | Resp 16 | Ht 70.0 in | Wt 190.0 lb

## 2018-07-08 DIAGNOSIS — Z87891 Personal history of nicotine dependence: Secondary | ICD-10-CM | POA: Diagnosis not present

## 2018-07-08 DIAGNOSIS — Z95820 Peripheral vascular angioplasty status with implants and grafts: Secondary | ICD-10-CM

## 2018-07-08 DIAGNOSIS — E1151 Type 2 diabetes mellitus with diabetic peripheral angiopathy without gangrene: Secondary | ICD-10-CM

## 2018-07-08 DIAGNOSIS — I739 Peripheral vascular disease, unspecified: Secondary | ICD-10-CM

## 2018-07-08 DIAGNOSIS — Z9889 Other specified postprocedural states: Secondary | ICD-10-CM

## 2018-07-08 DIAGNOSIS — I10 Essential (primary) hypertension: Secondary | ICD-10-CM | POA: Diagnosis not present

## 2018-07-08 DIAGNOSIS — Z95828 Presence of other vascular implants and grafts: Secondary | ICD-10-CM

## 2018-07-08 DIAGNOSIS — E785 Hyperlipidemia, unspecified: Secondary | ICD-10-CM | POA: Diagnosis not present

## 2018-07-08 DIAGNOSIS — I779 Disorder of arteries and arterioles, unspecified: Secondary | ICD-10-CM | POA: Diagnosis not present

## 2018-07-08 DIAGNOSIS — I70203 Unspecified atherosclerosis of native arteries of extremities, bilateral legs: Secondary | ICD-10-CM | POA: Diagnosis not present

## 2018-07-08 DIAGNOSIS — I6523 Occlusion and stenosis of bilateral carotid arteries: Secondary | ICD-10-CM

## 2018-07-08 NOTE — Patient Instructions (Addendum)
Stroke Prevention Some health problems and behaviors may make it more likely for you to have a stroke. Below are ways to lessen your risk of having a stroke.  Be active for at least 30 minutes on most or all days.  Do not smoke. Try not to be around others who smoke.  Do not drink too much alcohol. ? Do not have more than 2 drinks a day if you are a man. ? Do not have more than 1 drink a day if you are a woman and are not pregnant.  Eat healthy foods, such as fruits and vegetables. If you were put on a specific diet, follow the diet as told.  Keep your cholesterol levels under control through diet and medicines. Look for foods that are low in saturated fat, trans fat, cholesterol, and are high in fiber.  If you have diabetes, follow all diet plans and take your medicine as told.  Ask your doctor if you need treatment to lower your blood pressure. If you have high blood pressure (hypertension), follow all diet plans and take your medicine as told by your doctor.  If you are 18-39 years old, have your blood pressure checked every 3-5 years. If you are age 40 or older, have your blood pressure checked every year.  Keep a healthy weight. Eat foods that are low in calories, salt, saturated fat, trans fat, and cholesterol.  Do not take drugs.  Avoid birth control pills, if this applies. Talk to your doctor about the risks of taking birth control pills.  Talk to your doctor if you have sleep problems (sleep apnea).  Take all medicine as told by your doctor. ? You may be told to take aspirin or blood thinner medicine. Take this medicine as told by your doctor. ? Understand your medicine instructions.  Make sure any other conditions you have are being taken care of.  Get help right away if:  You suddenly lose feeling (you feel numb) or have weakness in your face, arm, or leg.  Your face or eyelid hangs down to one side.  You suddenly feel confused.  You have trouble talking  (aphasia) or understanding what people are saying.  You suddenly have trouble seeing in one or both eyes.  You suddenly have trouble walking.  You are dizzy.  You lose your balance or your movements are clumsy (uncoordinated).  You suddenly have a very bad headache and you do not know the cause.  You have new chest pain.  Your heart feels like it is fluttering or skipping a beat (irregular heartbeat). Do not wait to see if the symptoms above go away. Get help right away. Call your local emergency services (911 in [place] Do not drive yourself to the hospital. This information is not intended to replace advice given to you by your health care provider. Make sure you discuss any questions you have with your health care provider. Document Released: 04/13/2012 Document Revised: 03/20/2016 Document Reviewed: 04/15/2013 Elsevier Interactive Patient Education  2018 [employer] Inc[employer]     Peripheral Vascular Disease Peripheral vascular disease (PVD) is a disease of the blood vessels that are not part of your heart and brain. A simple term for PVD is poor circulation. In most cases, PVD narrows the blood vessels that carry blood from your heart to the rest of your body. This can result in a decreased supply of blood to your arms, legs, and internal organs, like your stomach or kidneys. However, it most often affects   a person's lower legs and feet. There are two types of PVD.  Organic PVD. This is the more common type. It is caused by damage to the structure of blood vessels.  Functional PVD. This is caused by conditions that make blood vessels contract and tighten (spasm).  Without treatment, PVD tends to get worse over time. PVD can also lead to acute ischemic limb. This is when an arm or limb suddenly has trouble getting enough blood. This is a medical emergency. Follow these instructions at home:  Take medicines only as told by your doctor.  Do not use any tobacco products, including  cigarettes, chewing tobacco, or electronic cigarettes. If you need help quitting, ask your doctor.  Lose weight if you are overweight, and maintain a healthy weight as told by your doctor.  Eat a diet that is low in fat and cholesterol. If you need help, ask your doctor.  Exercise regularly. Ask your doctor for some good activities for you.  Take good care of your feet. ? Wear comfortable shoes that fit well. ? Check your feet often for any cuts or sores. Contact a doctor if:  You have cramps in your legs while walking.  You have leg pain when you are at rest.  You have coldness in a leg or foot.  Your skin changes.  You are unable to get or have an erection (erectile dysfunction).  You have cuts or sores on your feet that are not healing. Get help right away if:  Your arm or leg turns cold and blue.  Your arms or legs become red, warm, swollen, painful, or numb.  You have chest pain or trouble breathing.  You suddenly have weakness in your face, arm, or leg.  You become very confused or you cannot speak.  You suddenly have a very bad headache.  You suddenly cannot see. This information is not intended to replace advice given to you by your health care provider. Make sure you discuss any questions you have with your health care provider. Document Released: 01/07/2010 Document Revised: 03/20/2016 Document Reviewed: 03/23/2014 Elsevier Interactive Patient Education  2017 [employer] Inc.  

## 2018-07-08 NOTE — Progress Notes (Signed)
[facility] [place]   CC: Follow up peripheral artery occlusive disease  History of Present Illness [name] is a 72 y.o. male who is s/pabdominal aortogram with bilateral lower extremity runoff, right superficial femoral artery stent (6 x 150 Inova self-expanding) placed, right popliteal artery angioplasty drug-coated balloon (Impact)on 11-13-17 by Dr. Oneida Alar for rest pain at right foot.  He had a right CEA years ago, left carotid stent in 2014. He denies any known stroke or TIA.   He has not had any revascularization procedures on his left LE, he has had a vein harvested from his left lower leg for his CABG.  He returned on 12-25-17 for a1 month follow upwith a repeat duplex ultrasound and ABIs. He will need to continue his statin Plavix and aspirin.  He was seeing Dr. Olevia Perches in [facility] for wound care to his right great toe, this has healed and he finished wound care in August 2019.   His podiatrist is Dr. Catha Gosselin.   He denies pain or weakness in his legs with walking, denies pain in his feet.  He states he can walk as far as he wants with no problems in his calves or thighs; occasionally his right hip with bother him slightly after walking a great deal.   He states he is going to the [place] [facility] in [place] next week to get hearing aids.    Diabetic: Yes, last A1C result was in 2014, at 9.2; pt states the most recent A1C was 7.2 in August 2019. His endocrinologist is Dr. Tamala Julian in [facility].  Tobacco use: former smoker, quit smoking in 2003 when he had a CABG.   Pt meds include: Statin :Yes Betablocker: Yes ASA: Yes Other anticoagulants/antiplatelets: Plavix    Past Medical History:  Diagnosis Date  . Arthritis   . CAD (coronary artery disease)   . Carotid artery occlusion   . Diabetes mellitus    fasting 100-120s  . Dizziness   . Dysrhythmia    skips beats  . GERD (gastroesophageal reflux  disease)   . History of kidney stones   . Hyperlipidemia   . Hypertension   . IHD (ischemic heart disease)    Prior CABG in 2003  . Normal nuclear stress test June 2012  . Peripheral vascular disease ([place])   . Torn rotator cuff     Social History Social History   Tobacco Use  . Smoking status: Former Smoker    Last attempt to quit: 10/27/2001    Years since quitting: 16.7  . Smokeless tobacco: Never Used  Substance Use Topics  . Alcohol use: No    Alcohol/week: 0.0 standard drinks  . Drug use: No    Family History Family History  Problem Relation Age of Onset  . Heart attack Mother   . Diabetes Mother   . Heart disease Mother   . Hypertension Mother   . Heart attack Father   . Heart disease Father   . Hypertension Father   . Diabetes Brother   . Diabetes Sister   . Diabetes Daughter   . Heart disease Daughter   . Hypertension Daughter   . Diabetes Sister     Past Surgical History:  Procedure Laterality Date  . ABDOMINAL AORTAGRAM N/A 12/21/2013   Procedure: ABDOMINAL Maxcine Ham;  Surgeon: Elam Dutch, MD;  Location: [facility] CATH LAB;  Service: Cardiovascular;  Laterality: N/A;  . ABDOMINAL AORTOGRAM N/A 11/13/2017   Procedure: ABDOMINAL AORTOGRAM;  Surgeon: Elam Dutch, MD;  Location: [place] CV LAB;  Service: Cardiovascular;  Laterality: N/A;  . APPENDECTOMY    . ARCH AORTOGRAM  08/26/2013   Procedure: ARCH AORTOGRAM;  Surgeon: Elam Dutch, MD;  Location: [facility] CATH LAB;  Service: Cardiovascular;;  . CARDIAC CATHETERIZATION  05/03/2002   EF 40-45%  . CARDIOVASCULAR STRESS TEST  08/10/2006   EF 65%  . CAROTID ENDARTERECTOMY    . CAROTID STENT INSERTION Left 08/26/2013   Procedure: CAROTID STENT INSERTION;  Surgeon: Elam Dutch, MD;  Location: [facility] CATH LAB;  Service: Cardiovascular;  Laterality: Left;  internal carotid  . CORONARY ARTERY BYPASS GRAFT  04/2002  . DOPPLER ECHOCARDIOGRAPHY  08/12/2002   EF 80-85%  . ENDARTERECTOMY Left 07/20/2013    Procedure: ATTEMPTED ENDARTERECTOMY CAROTID;  Surgeon: Conrad [place]ville, MD;  Location: [place];  Service: Vascular;  Laterality: Left;  . HERNIA REPAIR    . LOWER EXTREMITY ANGIOGRAPHY  11/13/2017   Procedure: Lower Extremity Angiography;  Surgeon: Elam Dutch, MD;  Location: [place] CV LAB;  Service: Cardiovascular;;  . PERIPHERAL VASCULAR INTERVENTION Right 11/13/2017   Procedure: PERIPHERAL VASCULAR INTERVENTION;  Surgeon: Elam Dutch, MD;  Location: [place] CV LAB;  Service: Cardiovascular;  Laterality: Right;  superficial femoral  . SHOULDER SURGERY      Allergies  Allergen Reactions  . Lipitor [Atorvastatin Calcium] Other (See Comments)    Muscle ache  . Sulfa Drugs Cross Reactors Other (See Comments)    "raw spots"  . Codeine Rash    Current Outpatient Medications  Medication Sig Dispense Refill  . amLODipine-benazepril (LOTREL) 5-20 MG capsule Take 1 capsule by mouth daily. 90 capsule 3  . aspirin EC 81 MG tablet Take 81 mg by mouth daily.    . clopidogrel (PLAVIX) 75 MG tablet TAKE 1 TABLET BY MOUTH ONCE DAILY 90 tablet 3  . insulin NPH Human (HUMULIN N,NOVOLIN N) 100 UNIT/ML injection Inject 45 Units into the skin 2 (two) times daily.    . insulin regular (NOVOLIN R,HUMULIN R) 100 units/mL injection Inject 20 Units into the skin 3 (three) times daily after meals.    . metFORMIN (GLUCOPHAGE-XR) 500 MG 24 hr tablet Take 500 mg by mouth 2 (two) times daily with a meal.     . metoprolol tartrate (LOPRESSOR) 50 MG tablet Take 1 tablet (50 mg total) by mouth 2 (two) times daily. 180 tablet 3  . nitroGLYCERIN (NITROSTAT) 0.4 MG SL tablet Place 1 tablet (0.4 mg total) under the tongue every 5 (five) minutes as needed for chest pain. 25 tablet 6  . omeprazole (PRILOSEC) 20 MG capsule Take 20 mg by mouth daily.    . rosuvastatin (CRESTOR) 20 MG tablet TAKE ONE TABLET BY MOUTH ONCE DAILY 90 tablet 3  . hydrochlorothiazide (HYDRODIURIL) 25 MG tablet Take 1 tablet (25 mg  total) by mouth daily. 90 tablet 3   No current facility-administered medications for this visit.     ROS: See HPI for pertinent positives and negatives.   Physical Examination  Vitals:   07/08/18 1046  BP: 128/72  Pulse: (!) 58  Resp: 16  Temp: 98.2 F (36.8 C)  TempSrc: Oral  SpO2: 94%  Weight: 190 lb (86.2 kg)  Height: 5\' 10"  (1.778 m)   Body mass index is 27.26 kg/m.  General: A&O x 3, WDWN, male. Gait: normal HENT: No gross abnormalities.  Eyes: PERRLA. Pulmonary: Respirations are non labored, CTAB, good air movement Cardiac: regular rhythm,  slightly bradycardic (on a beta blocker), no detected murmur.         Carotid Bruits Right Left   Negative Negative   Radial pulses are 2+ palpable right and 1+ left Adominal aortic pulse is not palpable                              VASCULAR EXAM: Extremities without ischemic changes, without Gangrene; without open wounds. Healed right great toe wound.                                                                                                                                                        LE Pulses Right Left       FEMORAL  2+ palpable  2+ palpable        POPLITEAL  not palpable   not palpable       POSTERIOR TIBIAL  not palpable   not palpable        DORSALIS PEDIS      ANTERIOR TIBIAL not palpable  not palpable    Abdomen: soft, NT, no palpable masses. Skin: no rashes, no cellulitis, no ulcers noted. Musculoskeletal: no muscle wasting or atrophy.      Neurologic: A&O X 3; appropriate affect, Sensation is normal; MOTOR FUNCTION:  moving all extremities equally, motor strength 5/5 throughout. Speech is fluent/normal. CN 2-12 intact. Psychiatric: Thought content is normal, mood appropriate for clinical situation.      ASSESSMENT: EWIN REHBERG is a 72 y.o. male who iss/p right superficial femoral artery stent placed (6 x 150 Inova self-expanding), right popliteal artery angioplasty  drug-coated balloon (Impact)on 11-13-17.   He had a right CEA years ago, left carotid stent in 2014. He has no hx of stroke or TIA.   I spoke with Dr. Oneida Alar re pt healed left toe ulcer, no claudication, and elevated velocities in right LE arterial duplex of today; see Plan.   Is atherosclerotic risk factors include uncontrolled DM for a long time until recently, smoking until 2003, and CAD. He takes a daily statin, ASA, and Plavix.     DATA  Bilateral LE Arterial Duplex (07-08-18): Right Duplex Findings: +----------+--------+-----+--------+----------+--------+      PSV cm/sRatioStenosisWaveform Comments +----------+--------+-----+--------+----------+--------+ CFA Distal219          biphasic      +----------+--------+-----+--------+----------+--------+ DFA    212          biphasic      +----------+--------+-----+--------+----------+--------+ SFA Prox 96          biphasic      +----------+--------+-----+--------+----------+--------+ SFA Mid  55          biphasic      +----------+--------+-----+--------+----------+--------+ SFA Distal71          biphasic      +----------+--------+-----+--------+----------+--------+  POP Prox 44          biphasic      +----------+--------+-----+--------+----------+--------+ POP Mid  63          triphasic      +----------+--------+-----+--------+----------+--------+ POP Distal66          biphasic      +----------+--------+-----+--------+----------+--------+ ATA Distal49          monophasic     +----------+--------+-----+--------+----------+--------+ PTA Distal49          monophasic     +----------+--------+-----+--------+----------+--------+   Right  Stent(s): +---------------+--------+---------------+--------+--------+ Proximal SFA  PSV cm/sStenosis    WaveformComments +---------------+--------+---------------+--------+--------+ Prox to Stent 81           biphasic     +---------------+--------+---------------+--------+--------+ Proximal Stent 141           biphasic     +---------------+--------+---------------+--------+--------+ Mid Stent   484   50-99% stenosisbiphasic     +---------------+--------+---------------+--------+--------+ Distal Stent  393           biphasic     +---------------+--------+---------------+--------+--------+ Distal to Stent127           biphasic     +---------------+--------+---------------+--------+--------+    Left Duplex Findings: +----------+--------+-----+--------+----------+--------+      PSV cm/sRatioStenosisWaveform Comments +----------+--------+-----+--------+----------+--------+ CFA [id number]          biphasic      +----------+--------+-----+--------+----------+--------+ DFA    253          biphasic      +----------+--------+-----+--------+----------+--------+ SFA Prox 357          biphasic      +----------+--------+-----+--------+----------+--------+ SFA Mid  301          biphasic      +----------+--------+-----+--------+----------+--------+ SFA Distal86          monophasic     +----------+--------+-----+--------+----------+--------+ POP Mid  45          monophasic     +----------+--------+-----+--------+----------+--------+ ATA Distal102          monophasic     +----------+--------+-----+--------+----------+--------+ PTA Distal51          monophasic      +----------+--------+-----+--------+----------+--------+  A focal velocity elevation of 357 cm/s was obtained at Proximal SFA with a VR of 3.3. Findings are characteristic of 50-74% stenosis.  Final Interpretation: Right: 50-74% stenosis noted in the superficial femoral artery. 50-99% stenosis seen in the proximal SFA stent.  Left: 50-74% stenosis noted in the superficial femoral artery.    Bilateral LE Arterial Duplex (04/01/18): Right: stent with no stenosis, all biphasic waveforms; monophasic at peroneal.  Left: Multilevel arterial occlusive diease in the proximal, mid, and distal (340 cm/s) SFA with probable tibial artery occlusive disease. Biphasic waveforms to mid popliteal, monophasic at ATA distal to peroneal distal.    ABI (Date: 07/08/2018):  R:   ABI: 0.72 (was 0.97 on 04-01-18),   PT: bi (was mono)  DP: bi (was mono)  TBI:  0.12, toe pressure 18 (not preformed previously)  L:   ABI: 0.68 (was 0.79),   PT: mono (was mono)  DP: mono (was mono)  TBI: 0.22, toe pressure 34 (was 0.23) Improved quality of right lower extremity waveforms with apparent decline in ABI to moderate disease; however, previous ABI was likely falsely elevated at 0.97 with monophasic waveforms.  Slight decline in left ABI with moderate disease, waveforms remain monophasic.    Carotid Duplex (04/01/18): Right ICA: CEA site with 1-39% stenosis Left ICA: >70% stenosis of the mid to distal stent (306 cm/s mid stent) (  was 324 cm/s on 02-12-17) Left ECA is not visualized.  Bilateral vertebral artery flow is antegrade.  Bilateral subclavian artery waveforms are normal.  No significant change compared to the exam on 02-12-17.     PLAN:  Based on the patient's vascular studies and examination, and after discussing with Dr. Oneida Alar, pt will return to clinic in 6 months with ABI's, right LE arterial duplex, and carotid duplex. Graduated walking program discussed and how to achieve. In  addition, daily seated leg exercised discussed and demonstrated.  I advised pt to notify us if he develops concerns re the circulation in his feet or legs.   I discussed in depth with the patient the nature of atherosclerosis, and emphasized the importance of maximal medical management including strict control of blood pressure, blood glucose, and lipid levels, obtaining regular exercise, and continued cessation of smoking.  The patient is aware that without maximal medical management the underlying atherosclerotic disease process will progress, limiting the benefit of any interventions.  The patient was given information about PAD including signs, symptoms, treatment, what symptoms should prompt the patient to seek immediate medical care, and risk reduction measures to take.  Clemon Chambers, RN, MSN, FNP-C [facility] [employer] Phone: [phone]  Clinic MD: [facility]  07/08/18 11:27 AM

## 2018-07-31 DIAGNOSIS — Z23 Encounter for immunization: Secondary | ICD-10-CM | POA: Diagnosis not present

## 2018-08-04 ENCOUNTER — Other Ambulatory Visit: Payer: Self-pay | Admitting: Cardiovascular Disease

## 2018-08-06 ENCOUNTER — Other Ambulatory Visit: Payer: Self-pay | Admitting: Cardiovascular Disease

## 2018-08-12 DIAGNOSIS — B351 Tinea unguium: Secondary | ICD-10-CM | POA: Diagnosis not present

## 2018-08-12 DIAGNOSIS — E1151 Type 2 diabetes mellitus with diabetic peripheral angiopathy without gangrene: Secondary | ICD-10-CM | POA: Diagnosis not present

## 2018-08-24 ENCOUNTER — Encounter: Payer: Self-pay | Admitting: Cardiovascular Disease

## 2018-08-24 ENCOUNTER — Encounter (INDEPENDENT_AMBULATORY_CARE_PROVIDER_SITE_OTHER): Payer: Self-pay

## 2018-08-24 ENCOUNTER — Ambulatory Visit: Payer: Medicare HMO | Admitting: Cardiovascular Disease

## 2018-08-24 VITALS — BP 138/66 | HR 61 | Ht 70.0 in | Wt 191.1 lb

## 2018-08-24 DIAGNOSIS — I6523 Occlusion and stenosis of bilateral carotid arteries: Secondary | ICD-10-CM | POA: Diagnosis not present

## 2018-08-24 DIAGNOSIS — E7849 Other hyperlipidemia: Secondary | ICD-10-CM | POA: Diagnosis not present

## 2018-08-24 DIAGNOSIS — I2510 Atherosclerotic heart disease of native coronary artery without angina pectoris: Secondary | ICD-10-CM

## 2018-08-24 LAB — HEPATIC FUNCTION PANEL
ALBUMIN: 4.4 g/dL (ref 3.5–4.8)
ALT: 17 IU/L (ref 0–44)
AST: 18 IU/L (ref 0–40)
Alkaline Phosphatase: 89 IU/L (ref 39–117)
BILIRUBIN TOTAL: 0.4 mg/dL (ref 0.0–1.2)
BILIRUBIN, DIRECT: 0.11 mg/dL (ref 0.00–0.40)
Total Protein: 7.4 g/dL (ref 6.0–8.5)

## 2018-08-24 LAB — BASIC METABOLIC PANEL
BUN/Creatinine Ratio: 11 (ref 10–24)
BUN: 13 mg/dL (ref 8–27)
CALCIUM: 9.9 mg/dL (ref 8.6–10.2)
CO2: 23 mmol/L (ref 20–29)
Chloride: 99 mmol/L (ref 96–106)
Creatinine, Ser: 1.19 mg/dL (ref 0.76–1.27)
GFR calc Af Amer: 71 mL/min/{1.73_m2} (ref >59)
GFR, EST NON AFRICAN AMERICAN: 62 mL/min/{1.73_m2} (ref >59)
Glucose: 85 mg/dL (ref 65–99)
POTASSIUM: 4.1 mmol/L (ref 3.5–5.2)
Sodium: 139 mmol/L (ref 134–144)

## 2018-08-24 LAB — LIPID PANEL
CHOL/HDL RATIO: 3.9 ratio (ref 0.0–5.0)
Cholesterol, Total: 132 mg/dL (ref 100–199)
HDL: 34 mg/dL — AB (ref >39)
LDL Calculated: 68 mg/dL (ref 0–99)
Triglycerides: 149 mg/dL (ref 0–149)
VLDL CHOLESTEROL CAL: 30 mg/dL (ref 5–40)

## 2018-08-24 MED ORDER — METOPROLOL TARTRATE 50 MG PO TABS: 50.0000 mg | ORAL_TABLET | Freq: Two times a day (BID) | ORAL | 3 refills | Status: DC

## 2018-08-24 NOTE — Patient Instructions (Signed)
Medication Instructions:  Your physician recommends that you continue on your current medications as directed. Please refer to the Current Medication list given to you today.  If you need a refill on your cardiac medications before your next appointment, please call your pharmacy.   Lab work: Today : bmet, lfts, lipids  If you have labs (blood work) drawn today and your tests are completely normal, you will receive your results only by: Marland Kitchen MyChart Message (if you have MyChart) OR . A paper copy in the mail If you have any lab test that is abnormal or we need to change your treatment, we will call you to review the results.  Testing/Procedures: none  Follow-Up: At [facility], you and your health needs are our priority.  As part of our continuing mission to provide you with exceptional heart care, we have created designated Provider Care Teams.  These Care Teams include your primary Cardiologist (physician) and Advanced Practice Providers (APPs -  Physician Assistants and Nurse Practitioners) who all work together to provide you with the care you need, when you need it. You will need a follow up appointment in:  6 months.  Please call our office 2 months in advance to schedule this appointment.  You may see Mertie Moores, MD or one of the following Advanced Practice Providers on your designated Care Team: Richardson Dopp, PA-C [place], [place] . Daune Perch, NP  Any Other Special Instructions Will Be Listed Below (If Applicable). none

## 2018-08-24 NOTE — Progress Notes (Signed)
Cardiology Office Note   Date:  08/24/2018   ID:  Ladarien, Beeks 1946-07-15, MRN [id number]  PCP:  Haywood Pao, MD  Cardiologist: previously Darlin Coco MD , now Mertie Moores, MD   Chief Complaint  Patient presents with  . Coronary Artery Disease   Problem list 1. Coronary artery disease-status post CABG 2003 2. Hyperlipidemia 3. Carotid artery disease-status post left carotid stenting , s/p Right CEA  4. Essential hypertension 5. Diabetes mellitus     [name] is a 72 y.o. male who presents for a six-month follow-up visit  Notes from Dr. Mare Ferrari: . He has a past history of coronary artery disease and had coronary artery bypass graft surgery in 2003. He has a remote history of a right carotid endarterectomy about 18 years ago. He had a left carotid endarterectomy by Dr. Oneida Alar in 2014. He has a history of high blood pressure, diabetes mellitus, and hyperlipidemia. He had a normal nuclear stress test in June 2012 prior to rotator cuff surgery. The patient developed claudication of his right leg  and underwent successful vascular surgery in February 2015. Since last visit he has been doing well with no chest pain or shortness of breath. Since last visit he has retired and has been enjoying retirement. He is no longer experiencing any right calf claudication. He has diabetes followed by Dr. Tamala Julian at [facility]. His most recent A1c was 7.2 Since last visit the patient has had no new cardiac symptoms.  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  He himself lives in a townhouse.  Aug. 18, 2017:  [name] is seen today for the first time. He's a previous patient of Dr. Mare Ferrari. He is followed for coronary artery disease, hyperlipidemia, carotid artery disease, hypertension.  Doing well.   Stays active.   Does lots of woodworking . Walks often   No angina   Feb. 14,  2018; Doing well.  No CP or dyspnea. Not exercising much ,  Avoids salt.   Oct. 1, 2018:  Here for eval of HTN. Still eating some salty food . Avoids hot dogs now .  stopped smoking years ago. As active as he can be,  Has a heel spur  February 23, 2018:  BP hyas been normal blood pressure is a bit high Is avoiding hot dogs  No CP or dyspnea.  Has a left carotid stent, s/p R CEA   August 24, 2018: [name]  is seen today for follow-up of his coronary artery disease, hypertension and carotid artery disease. Staying active,  Works in the garden,  Works in his woodworking shop    Past Medical History:  Diagnosis Date  . Arthritis   . CAD (coronary artery disease)   . Carotid artery occlusion   . Diabetes mellitus    fasting 100-120s  . Dizziness   . Dysrhythmia    skips beats  . GERD (gastroesophageal reflux disease)   . History of kidney stones   . Hyperlipidemia   . Hypertension   . IHD (ischemic heart disease)    Prior CABG in 2003  . Normal nuclear stress test June 2012  . Peripheral vascular disease ([place])   . Torn rotator cuff     Past Surgical History:  Procedure Laterality Date  . ABDOMINAL AORTAGRAM N/A 12/21/2013   Procedure: ABDOMINAL Maxcine Ham;  Surgeon: Elam Dutch, MD;  Location: [facility]  CATH LAB;  Service: Cardiovascular;  Laterality: N/A;  . ABDOMINAL AORTOGRAM N/A 11/13/2017   Procedure: ABDOMINAL AORTOGRAM;  Surgeon: Elam Dutch, MD;  Location: [facility] CV LAB;  Service: Cardiovascular;  Laterality: N/A;  . APPENDECTOMY    . ARCH AORTOGRAM  08/26/2013   Procedure: ARCH AORTOGRAM;  Surgeon: Elam Dutch, MD;  Location: [facility] CATH LAB;  Service: Cardiovascular;;  . CARDIAC CATHETERIZATION  05/03/2002   EF 40-45%  . CARDIOVASCULAR STRESS TEST  08/10/2006   EF 65%  . CAROTID ENDARTERECTOMY    . CAROTID STENT INSERTION Left 08/26/2013   Procedure: CAROTID STENT INSERTION;  Surgeon: Elam Dutch, MD;  Location: [facility] CATH LAB;  Service:  Cardiovascular;  Laterality: Left;  internal carotid  . CORONARY ARTERY BYPASS GRAFT  04/2002  . DOPPLER ECHOCARDIOGRAPHY  08/12/2002   EF 80-85%  . ENDARTERECTOMY Left 07/20/2013   Procedure: ATTEMPTED ENDARTERECTOMY CAROTID;  Surgeon: Conrad Racine, MD;  Location: [facility];  Service: Vascular;  Laterality: Left;  . HERNIA REPAIR    . LOWER EXTREMITY ANGIOGRAPHY  11/13/2017   Procedure: Lower Extremity Angiography;  Surgeon: Elam Dutch, MD;  Location: [facility] CV LAB;  Service: Cardiovascular;;  . PERIPHERAL VASCULAR INTERVENTION Right 11/13/2017   Procedure: PERIPHERAL VASCULAR INTERVENTION;  Surgeon: Elam Dutch, MD;  Location: [facility] LAB;  Service: Cardiovascular;  Laterality: Right;  superficial femoral  . SHOULDER SURGERY       Current Outpatient Medications  Medication Sig Dispense Refill  . amLODipine-benazepril (LOTREL) 5-20 MG capsule TAKE 1 CAPSULE BY MOUTH ONCE DAILY 90 capsule 1  . aspirin EC 81 MG tablet Take 81 mg by mouth daily.    . clopidogrel (PLAVIX) 75 MG tablet TAKE 1 TABLET BY MOUTH ONCE DAILY 90 tablet 3  . hydrochlorothiazide (HYDRODIURIL) 25 MG tablet TAKE 1 TABLET BY MOUTH ONCE DAILY 90 tablet 1  . insulin NPH Human (HUMULIN N,NOVOLIN N) 100 UNIT/ML injection Inject 45 Units into the skin 2 (two) times daily.    . insulin regular (NOVOLIN R,HUMULIN R) 100 units/mL injection Inject 20 Units into the skin 3 (three) times daily after meals.    . metFORMIN (GLUCOPHAGE-XR) 500 MG 24 hr tablet Take 500 mg by mouth 2 (two) times daily with a meal.     . metoprolol tartrate (LOPRESSOR) 50 MG tablet Take 1 tablet (50 mg total) by mouth 2 (two) times daily. 180 tablet 3  . nitroGLYCERIN (NITROSTAT) 0.4 MG SL tablet Place 1 tablet (0.4 mg total) under the tongue every 5 (five) minutes as needed for chest pain. 25 tablet 6  . omeprazole (PRILOSEC) 20 MG capsule Take 20 mg by mouth daily.    . rosuvastatin (CRESTOR) 20 MG tablet TAKE 1 TABLET BY MOUTH ONCE  DAILY 90 tablet 1   No current facility-administered medications for this visit.     Allergies:   Lipitor [atorvastatin calcium]; Sulfa drugs cross reactors; and Codeine    Social History:  The patient  reports that he quit smoking about 16 years ago. He has never used smokeless tobacco. He reports that he does not drink alcohol or use drugs.   Family History:  The patient's family history includes Diabetes in his brother, daughter, mother, sister, and sister; Heart attack in his father and mother; Heart disease in his daughter, father, and mother; Hypertension in his daughter, father, and mother.    ROS:  Please see the history of present illness.   Otherwise, review of systems  are positive for none.   All other systems are reviewed and negative.   Physical Exam: Blood pressure 138/66, pulse 61, height 5\' 10"  (1.778 m), weight 191 lb 1.9 oz (86.7 kg), SpO2 97 %.  GEN:  Well nourished, well developed in no acute distress HEENT: Normal NECK: No JVD; No carotid bruits LYMPHATICS: No lymphadenopathy CARDIAC: RRR , no murmurs, rubs, gallops RESPIRATORY:  Clear to auscultation without rales, wheezing or rhonchi  ABDOMEN: Soft, non-tender, non-distended MUSCULOSKELETAL:  No edema; No deformity  SKIN: Warm and dry NEUROLOGIC:  Alert and oriented x 3    EKG:   August 24, 2018: Normal sinus rhythm at 61 beats minute.  Occasional premature ventricular contractions.  Poor R wave progression suggestive of an anterior wall myocardial infarction.     Recent Labs: 11/13/2017: Hemoglobin 16.7 02/23/2018: ALT 22; BUN 24; Creatinine, Ser 1.23; Potassium 4.5; Sodium 139    Lipid Panel    Component Value Date/Time   CHOL 126 02/23/2018 1014   TRIG 101 02/23/2018 1014   HDL 34 (L) 02/23/2018 1014   CHOLHDL 3.7 02/23/2018 1014   CHOLHDL 3.2 06/13/2016 0918   VLDL 21 06/13/2016 0918   LDLCALC 72 02/23/2018 1014      Wt Readings from Last 3 Encounters:  08/24/18 191 lb 1.9 oz (86.7 kg)    07/08/18 190 lb (86.2 kg)  04/01/18 189 lb 3.2 oz (85.8 kg)      ASSESSMENT AND PLAN:  1.  CAD :     S/p CABG.   Doing well,  No angina   2. diabetes mellitus insulin-    Hb is 7.2.   Continue meds   3. hypertensive heart disease without heart failure.     BP looks look good   4. Hyperlipidemia.   - continue meds.   Check labs today   6. Bilateral carotid artery disease:   S/p R CEA  Doing well     Current medicines are reviewed at length with the patient today.  The patient does not have concerns regarding medicines.  The following changes have been made:  no change  Labs/ tests ordered today include:    Orders Placed This Encounter  Procedures  . Basic metabolic panel  . Hepatic function panel  . Lipid panel  . EKG 12-Lead   Will have him follow up with APP in 6 months,   I'll see him in 1 year.    Mertie Moores, MD  08/24/2018 8:21 AM    [address],  [place], [place]  [address] Pager [phone] Phone: [phone]; Fax: [phone]

## 2018-08-31 DIAGNOSIS — E1165 Type 2 diabetes mellitus with hyperglycemia: Secondary | ICD-10-CM | POA: Diagnosis not present

## 2018-08-31 DIAGNOSIS — E785 Hyperlipidemia, unspecified: Secondary | ICD-10-CM | POA: Diagnosis not present

## 2018-08-31 DIAGNOSIS — E1142 Type 2 diabetes mellitus with diabetic polyneuropathy: Secondary | ICD-10-CM | POA: Diagnosis not present

## 2018-08-31 DIAGNOSIS — I10 Essential (primary) hypertension: Secondary | ICD-10-CM | POA: Diagnosis not present

## 2018-08-31 DIAGNOSIS — Z794 Long term (current) use of insulin: Secondary | ICD-10-CM | POA: Diagnosis not present

## 2018-11-15 DIAGNOSIS — L859 Epidermal thickening, unspecified: Secondary | ICD-10-CM | POA: Diagnosis not present

## 2018-11-15 DIAGNOSIS — E1151 Type 2 diabetes mellitus with diabetic peripheral angiopathy without gangrene: Secondary | ICD-10-CM | POA: Diagnosis not present

## 2018-11-15 DIAGNOSIS — B351 Tinea unguium: Secondary | ICD-10-CM | POA: Diagnosis not present

## 2018-11-22 DIAGNOSIS — R1032 Left lower quadrant pain: Secondary | ICD-10-CM | POA: Diagnosis not present

## 2018-11-22 DIAGNOSIS — K5792 Diverticulitis of intestine, part unspecified, without perforation or abscess without bleeding: Secondary | ICD-10-CM | POA: Diagnosis not present

## 2018-11-22 DIAGNOSIS — R109 Unspecified abdominal pain: Secondary | ICD-10-CM | POA: Diagnosis not present

## 2018-11-23 DIAGNOSIS — E7800 Pure hypercholesterolemia, unspecified: Secondary | ICD-10-CM | POA: Diagnosis not present

## 2018-11-23 DIAGNOSIS — R972 Elevated prostate specific antigen [PSA]: Secondary | ICD-10-CM | POA: Diagnosis not present

## 2018-11-23 DIAGNOSIS — E1151 Type 2 diabetes mellitus with diabetic peripheral angiopathy without gangrene: Secondary | ICD-10-CM | POA: Diagnosis not present

## 2018-11-23 DIAGNOSIS — Z125 Encounter for screening for malignant neoplasm of prostate: Secondary | ICD-10-CM | POA: Diagnosis not present

## 2018-11-23 DIAGNOSIS — I10 Essential (primary) hypertension: Secondary | ICD-10-CM | POA: Diagnosis not present

## 2018-11-23 DIAGNOSIS — R82998 Other abnormal findings in urine: Secondary | ICD-10-CM | POA: Diagnosis not present

## 2018-11-30 DIAGNOSIS — K2270 Barrett's esophagus without dysplasia: Secondary | ICD-10-CM | POA: Diagnosis not present

## 2018-11-30 DIAGNOSIS — I6529 Occlusion and stenosis of unspecified carotid artery: Secondary | ICD-10-CM | POA: Diagnosis not present

## 2018-11-30 DIAGNOSIS — R972 Elevated prostate specific antigen [PSA]: Secondary | ICD-10-CM | POA: Diagnosis not present

## 2018-11-30 DIAGNOSIS — I7389 Other specified peripheral vascular diseases: Secondary | ICD-10-CM | POA: Diagnosis not present

## 2018-11-30 DIAGNOSIS — Z0000 Encounter for general adult medical examination without abnormal findings: Secondary | ICD-10-CM | POA: Diagnosis not present

## 2018-11-30 DIAGNOSIS — E1151 Type 2 diabetes mellitus with diabetic peripheral angiopathy without gangrene: Secondary | ICD-10-CM | POA: Diagnosis not present

## 2018-11-30 DIAGNOSIS — I10 Essential (primary) hypertension: Secondary | ICD-10-CM | POA: Diagnosis not present

## 2018-11-30 DIAGNOSIS — H9193 Unspecified hearing loss, bilateral: Secondary | ICD-10-CM | POA: Diagnosis not present

## 2018-11-30 DIAGNOSIS — I25810 Atherosclerosis of coronary artery bypass graft(s) without angina pectoris: Secondary | ICD-10-CM | POA: Diagnosis not present

## 2018-11-30 DIAGNOSIS — E7800 Pure hypercholesterolemia, unspecified: Secondary | ICD-10-CM | POA: Diagnosis not present

## 2018-12-01 DIAGNOSIS — E1165 Type 2 diabetes mellitus with hyperglycemia: Secondary | ICD-10-CM | POA: Diagnosis not present

## 2018-12-01 DIAGNOSIS — E785 Hyperlipidemia, unspecified: Secondary | ICD-10-CM | POA: Diagnosis not present

## 2018-12-01 DIAGNOSIS — E1142 Type 2 diabetes mellitus with diabetic polyneuropathy: Secondary | ICD-10-CM | POA: Diagnosis not present

## 2018-12-01 DIAGNOSIS — I10 Essential (primary) hypertension: Secondary | ICD-10-CM | POA: Diagnosis not present

## 2018-12-01 DIAGNOSIS — Z794 Long term (current) use of insulin: Secondary | ICD-10-CM | POA: Diagnosis not present

## 2018-12-03 DIAGNOSIS — Z1212 Encounter for screening for malignant neoplasm of rectum: Secondary | ICD-10-CM | POA: Diagnosis not present

## 2018-12-10 ENCOUNTER — Other Ambulatory Visit: Payer: Self-pay | Admitting: Physician Assistant

## 2018-12-10 DIAGNOSIS — K5792 Diverticulitis of intestine, part unspecified, without perforation or abscess without bleeding: Secondary | ICD-10-CM | POA: Diagnosis not present

## 2018-12-10 DIAGNOSIS — R109 Unspecified abdominal pain: Secondary | ICD-10-CM

## 2018-12-10 DIAGNOSIS — K5900 Constipation, unspecified: Secondary | ICD-10-CM | POA: Diagnosis not present

## 2018-12-10 DIAGNOSIS — R1032 Left lower quadrant pain: Secondary | ICD-10-CM

## 2018-12-10 DIAGNOSIS — R972 Elevated prostate specific antigen [PSA]: Secondary | ICD-10-CM | POA: Diagnosis not present

## 2018-12-16 ENCOUNTER — Ambulatory Visit
Admission: RE | Admit: 2018-12-16 | Discharge: 2018-12-16 | Disposition: A | Discharge status: Home / Self Care | Payer: Medicare HMO | Source: Ambulatory Visit | Attending: Physician Assistant | Admitting: Physician Assistant

## 2018-12-16 DIAGNOSIS — R1032 Left lower quadrant pain: Secondary | ICD-10-CM

## 2018-12-16 DIAGNOSIS — R109 Unspecified abdominal pain: Secondary | ICD-10-CM

## 2018-12-16 DIAGNOSIS — K5792 Diverticulitis of intestine, part unspecified, without perforation or abscess without bleeding: Secondary | ICD-10-CM

## 2018-12-16 DIAGNOSIS — N200 Calculus of kidney: Secondary | ICD-10-CM | POA: Diagnosis not present

## 2018-12-16 MED ORDER — IOPAMIDOL (ISOVUE-300) INJECTION 61%
100.0000 mL | Freq: Once | INTRAVENOUS | Status: AC | PRN
  Administered 2018-12-16: 100 mL via INTRAVENOUS

## 2018-12-17 ENCOUNTER — Other Ambulatory Visit: Payer: Medicare HMO

## 2018-12-20 DIAGNOSIS — R1032 Left lower quadrant pain: Secondary | ICD-10-CM | POA: Diagnosis not present

## 2018-12-20 DIAGNOSIS — N281 Cyst of kidney, acquired: Secondary | ICD-10-CM | POA: Diagnosis not present

## 2018-12-20 DIAGNOSIS — K5792 Diverticulitis of intestine, part unspecified, without perforation or abscess without bleeding: Secondary | ICD-10-CM | POA: Diagnosis not present

## 2019-01-13 ENCOUNTER — Encounter (HOSPITAL_COMMUNITY): Payer: Medicare HMO

## 2019-01-13 ENCOUNTER — Ambulatory Visit: Payer: Medicare HMO | Admitting: Family

## 2019-01-13 ENCOUNTER — Inpatient Hospital Stay (HOSPITAL_COMMUNITY): Admission: RE | Admit: 2019-01-13 | Payer: Medicare HMO | Source: Ambulatory Visit

## 2019-01-13 ENCOUNTER — Other Ambulatory Visit (HOSPITAL_COMMUNITY): Payer: Medicare HMO

## 2019-01-20 ENCOUNTER — Other Ambulatory Visit: Payer: Self-pay | Admitting: Cardiovascular Disease

## 2019-02-10 ENCOUNTER — Other Ambulatory Visit: Payer: Self-pay | Admitting: Cardiovascular Disease

## 2019-02-10 MED ORDER — AMLODIPINE BESY-BENAZEPRIL HCL 5-20 MG PO CAPS: 1.0000 | ORAL_CAPSULE | Freq: Every day | ORAL | 1 refills | Status: DC

## 2019-02-11 ENCOUNTER — Other Ambulatory Visit: Payer: Self-pay | Admitting: Vascular Surgery

## 2019-02-11 MED ORDER — CLOPIDOGREL BISULFATE 75 MG PO TABS: 75.0000 mg | ORAL_TABLET | Freq: Every day | ORAL | 3 refills | Status: DC

## 2019-02-22 ENCOUNTER — Ambulatory Visit: Payer: Medicare HMO | Admitting: Cardiovascular Disease

## 2019-04-08 ENCOUNTER — Other Ambulatory Visit: Payer: Self-pay

## 2019-04-08 DIAGNOSIS — I779 Disorder of arteries and arterioles, unspecified: Secondary | ICD-10-CM

## 2019-04-08 DIAGNOSIS — I6523 Occlusion and stenosis of bilateral carotid arteries: Secondary | ICD-10-CM

## 2019-04-11 ENCOUNTER — Encounter: Payer: Self-pay | Admitting: Family

## 2019-04-11 ENCOUNTER — Ambulatory Visit: Payer: Medicare HMO | Admitting: Family

## 2019-04-11 ENCOUNTER — Ambulatory Visit (INDEPENDENT_AMBULATORY_CARE_PROVIDER_SITE_OTHER)
Admission: RE | Admit: 2019-04-11 | Discharge: 2019-04-11 | Disposition: A | Discharge status: Home / Self Care | Payer: Medicare HMO | Source: Ambulatory Visit | Attending: Family | Admitting: Family

## 2019-04-11 ENCOUNTER — Other Ambulatory Visit: Payer: Self-pay

## 2019-04-11 ENCOUNTER — Ambulatory Visit (HOSPITAL_COMMUNITY)
Admission: RE | Admit: 2019-04-11 | Discharge: 2019-04-11 | Disposition: A | Discharge status: Home / Self Care | Payer: Medicare HMO | Source: Ambulatory Visit | Attending: Family | Admitting: Family

## 2019-04-11 VITALS — BP 135/68 | HR 65 | Resp 16 | Ht 71.0 in | Wt 187.0 lb

## 2019-04-11 DIAGNOSIS — I779 Disorder of arteries and arterioles, unspecified: Secondary | ICD-10-CM

## 2019-04-11 DIAGNOSIS — Z95828 Presence of other vascular implants and grafts: Secondary | ICD-10-CM

## 2019-04-11 DIAGNOSIS — I6523 Occlusion and stenosis of bilateral carotid arteries: Secondary | ICD-10-CM | POA: Insufficient documentation

## 2019-04-11 DIAGNOSIS — E1151 Type 2 diabetes mellitus with diabetic peripheral angiopathy without gangrene: Secondary | ICD-10-CM

## 2019-04-11 DIAGNOSIS — Z87891 Personal history of nicotine dependence: Secondary | ICD-10-CM

## 2019-04-11 DIAGNOSIS — Z95820 Peripheral vascular angioplasty status with implants and grafts: Secondary | ICD-10-CM

## 2019-04-11 DIAGNOSIS — Z9889 Other specified postprocedural states: Secondary | ICD-10-CM

## 2019-04-11 NOTE — Progress Notes (Signed)
[facility] [place] HISTORY AND PHYSICAL   CC: Follow up extracranial carotid artery stenosis and peripheral artery occlusive disease   History of Present Illness:   DAY DEERY is a 73 y.o. male who is s/pabdominal aortogram with bilateral lower extremity runoff, right superficial femoral artery stent (6 x 150 Inova self-expanding) placed, right popliteal artery angioplasty drug-coated balloon (Impact)on 11-13-17 by Dr. Oneida Alar for rest pain at right foot.  He had a right CEA years ago, left carotid stent in 2014. He denies any known stroke or TIA.  He has not had any revascularization procedures on his left LE, he has had a vein harvested from his leftlower leg for his CABG.  He returned on 3-1-19for a1 month follow upwith a repeat duplex ultrasound and ABIs. He will need to continue his statin Plavix and aspirin.  He was seeing Dr. Olevia Perches in [facility] for wound care to his right great toe, this has healed and he finished wound care in August 2019.  His podiatrist is Dr. Catha Gosselin.   He denies pain or weakness in his legs with walking, denies pain in his feet. He states he can walk as far as he wants with no problems in his calves or thighs; occasionally his right hip will bother him slightly after walking a great deal.   He states he is going to the [facility] in [place] for primary care, and he also see Dr. Odette Fraction for primary care.    Diabetic:Yes,last A1C result was in 2014, at 9.2; pt states the most recent A1C was7.2. His endocrinologist is Dr. Tamala Julian in [facility]. Tobacco OHY:WVPXTG smoker,quit smoking in 2003 when he had a CABG.  Pt meds include: Statin :Yes Betablocker:Yes ASA:Yes Other anticoagulants/antiplatelets:Plavix    Current Outpatient Medications  Medication Sig Dispense Refill  . amLODipine-benazepril (LOTREL) 5-20 MG capsule Take 1 capsule by mouth daily. 90 capsule 1  .  aspirin EC 81 MG tablet Take 81 mg by mouth daily.    . clopidogrel (PLAVIX) 75 MG tablet Take 1 tablet (75 mg total) by mouth daily. 90 tablet 3  . hydrochlorothiazide (HYDRODIURIL) 25 MG tablet Take 1 tablet by mouth once daily 90 tablet 1  . insulin NPH Human (HUMULIN N,NOVOLIN N) 100 UNIT/ML injection Inject 45 Units into the skin 2 (two) times daily.    . insulin regular (NOVOLIN R,HUMULIN R) 100 units/mL injection Inject 20 Units into the skin 3 (three) times daily after meals.    . metFORMIN (GLUCOPHAGE-XR) 500 MG 24 hr tablet Take 500 mg by mouth 2 (two) times daily with a meal.     . metoprolol tartrate (LOPRESSOR) 50 MG tablet Take 1 tablet (50 mg total) by mouth 2 (two) times daily. 180 tablet 3  . nitroGLYCERIN (NITROSTAT) 0.4 MG SL tablet Place 1 tablet (0.4 mg total) under the tongue every 5 (five) minutes as needed for chest pain. 25 tablet 6  . omeprazole (PRILOSEC) 20 MG capsule Take 20 mg by mouth daily.    . rosuvastatin (CRESTOR) 20 MG tablet Take 1 tablet by mouth once daily 90 tablet 1   No current facility-administered medications for this visit.     Past Medical History:  Diagnosis Date  . Arthritis   . CAD (coronary artery disease)   . Carotid artery occlusion   . Diabetes mellitus    fasting 100-120s  . Dizziness   . Dysrhythmia    skips beats  . GERD (gastroesophageal reflux  disease)   . History of kidney stones   . Hyperlipidemia   . Hypertension   . IHD (ischemic heart disease)    Prior CABG in 2003  . Normal nuclear stress test June 2012  . Peripheral vascular disease ([employer])   . Torn rotator cuff     Social History Social History   Tobacco Use  . Smoking status: Former Smoker    Quit date: 10/27/2001    Years since quitting: 17.4  . Smokeless tobacco: Never Used  Substance Use Topics  . Alcohol use: No    Alcohol/week: 0.0 standard drinks  . Drug use: No    Family History Family History  Problem Relation Age of Onset  . Heart attack  Mother   . Diabetes Mother   . Heart disease Mother   . Hypertension Mother   . Heart attack Father   . Heart disease Father   . Hypertension Father   . Diabetes Brother   . Diabetes Sister   . Diabetes Daughter   . Heart disease Daughter   . Hypertension Daughter   . Diabetes Sister     Surgical History Past Surgical History:  Procedure Laterality Date  . ABDOMINAL AORTAGRAM N/A 12/21/2013   Procedure: ABDOMINAL Maxcine Ham;  Surgeon: Elam Dutch, MD;  Location: [facility] CATH LAB;  Service: Cardiovascular;  Laterality: N/A;  . ABDOMINAL AORTOGRAM N/A 11/13/2017   Procedure: ABDOMINAL AORTOGRAM;  Surgeon: Elam Dutch, MD;  Location: [facility] CV LAB;  Service: Cardiovascular;  Laterality: N/A;  . APPENDECTOMY    . ARCH AORTOGRAM  08/26/2013   Procedure: ARCH AORTOGRAM;  Surgeon: Elam Dutch, MD;  Location: [facility] CATH LAB;  Service: Cardiovascular;;  . CARDIAC CATHETERIZATION  05/03/2002   EF 40-45%  . CARDIOVASCULAR STRESS TEST  08/10/2006   EF 65%  . CAROTID ENDARTERECTOMY    . CAROTID STENT INSERTION Left 08/26/2013   Procedure: CAROTID STENT INSERTION;  Surgeon: Elam Dutch, MD;  Location: [facility] CATH LAB;  Service: Cardiovascular;  Laterality: Left;  internal carotid  . CORONARY ARTERY BYPASS GRAFT  04/2002  . DOPPLER ECHOCARDIOGRAPHY  08/12/2002   EF 80-85%  . ENDARTERECTOMY Left 07/20/2013   Procedure: ATTEMPTED ENDARTERECTOMY CAROTID;  Surgeon: Conrad [place] G[place] Dale, MD;  Location: [place];  Service: Vascular;  Laterality: Left;  . HERNIA REPAIR    . LOWER EXTREMITY ANGIOGRAPHY  11/13/2017   Procedure: Lower Extremity Angiography;  Surgeon: Elam Dutch, MD;  Location: [place] CV LAB;  Service: Cardiovascular;;  . PERIPHERAL VASCULAR INTERVENTION Right 11/13/2017   Procedure: PERIPHERAL VASCULAR INTERVENTION;  Surgeon: Elam Dutch, MD;  Location: [place] CV LAB;  Service: Cardiovascular;  Laterality: Right;  superficial femoral  . SHOULDER SURGERY       Allergies  Allergen Reactions  . Lipitor [Atorvastatin Calcium] Other (See Comments)    Muscle ache  . Sulfa Drugs Cross Reactors Other (See Comments)    "raw spots"  . Codeine Rash    Current Outpatient Medications  Medication Sig Dispense Refill  . amLODipine-benazepril (LOTREL) 5-20 MG capsule Take 1 capsule by mouth daily. 90 capsule 1  . aspirin EC 81 MG tablet Take 81 mg by mouth daily.    . clopidogrel (PLAVIX) 75 MG tablet Take 1 tablet (75 mg total) by mouth daily. 90 tablet 3  . hydrochlorothiazide (HYDRODIURIL) 25 MG tablet Take 1 tablet by mouth once daily 90 tablet 1  . insulin NPH Human (HUMULIN N,NOVOLIN N) 100 UNIT/ML injection Inject 45  Units into the skin 2 (two) times daily.    . insulin regular (NOVOLIN R,HUMULIN R) 100 units/mL injection Inject 20 Units into the skin 3 (three) times daily after meals.    . metFORMIN (GLUCOPHAGE-XR) 500 MG 24 hr tablet Take 500 mg by mouth 2 (two) times daily with a meal.     . metoprolol tartrate (LOPRESSOR) 50 MG tablet Take 1 tablet (50 mg total) by mouth 2 (two) times daily. 180 tablet 3  . nitroGLYCERIN (NITROSTAT) 0.4 MG SL tablet Place 1 tablet (0.4 mg total) under the tongue every 5 (five) minutes as needed for chest pain. 25 tablet 6  . omeprazole (PRILOSEC) 20 MG capsule Take 20 mg by mouth daily.    . rosuvastatin (CRESTOR) 20 MG tablet Take 1 tablet by mouth once daily 90 tablet 1   No current facility-administered medications for this visit.      REVIEW OF SYSTEMS: See HPI for pertinent positives and negatives.  Physical Examination Vitals:   04/11/19 1534 04/11/19 1544  BP: (!) 160/67 135/68  Pulse: 65 65  Resp: 16   SpO2: 97%   Weight: 187 lb (84.8 kg)   Height: 5\' 11"  (1.803 m)    Body mass index is 26.08 kg/m.  General:  WDWN male in NAD Gait: Normal HENT: no gross abnormalities  Eyes: Pupils equal Pulmonary: normal non-labored breathing, good air movement in all fields, CTAB, no rales, rhonchi,  or wheezing Cardiac: RRR, no murmur detected Abdomen: soft, NT, no masses palpated Skin: no rashes, no ulcers, no cellulitis.   VASCULAR EXAM  Carotid Bruits Right Left   Negative Negative      Radial pulses are 2+ palpable bilaterally   Adominal aortic pulse is not palpable                      VASCULAR EXAM: Extremities without ischemic changes, without Gangrene; without open wounds.                                                                                                          LE Pulses Right Left       FEMORAL  2+ palpable  2+ palpable        POPLITEAL  not palpable   not palpable       POSTERIOR TIBIAL  not palpable   not palpable        DORSALIS PEDIS      ANTERIOR TIBIAL not palpable  not palpable    Musculoskeletal: no muscle wasting or atrophy; no peripheral edema. Bilateral hammertoes 2nd and 3rd toes.  Neurologic:  A&O X 3; appropriate affect, sensation is normal; speech is normal, CN 2-12 intact except has noticeable hearing loss, pain and light touch intact in extremities, motor exam as listed above. Psychiatric: Normal thought content, mood appropriate to clinical situation.    ASSESSMENT:  EZECHIEL STOOKSBURY is a 73 y.o. male who iss/p right superficial femoral artery stent placed (6 x 150 Inova self-expanding), right popliteal artery angioplasty drug-coated balloon (Impact)on 11-13-17.  He had a right CEA years ago, left carotid stent in 2014.He has no hx of stroke or TIA. Carotid duplex today shows 1-39% stenosis in the right ICA, 40-59% in the left ICA.   Pt has healed the ulcer on his left toe, has no claudication, and has elevated velocities in bilateral LE arterial duplex of today; bilateral ABI's have also declined.  See Plan.   His atherosclerotic risk factors include uncontrolled DM for a long time until recently, smoking until 2003, and CAD. He takes a daily statin, ASA, and Plavix.   DATA  Carotid Duplex (04-11-19) Right  Carotid Findings: +----------+--------+--------+--------+----------------------+--------+           PSV cm/sEDV cm/sStenosisDescribe              Comments +----------+--------+--------+--------+----------------------+--------+ CCA Prox  136     0                                              +----------+--------+--------+--------+----------------------+--------+ CCA Mid   98      20              calcific                       +----------+--------+--------+--------+----------------------+--------+ CCA Distal209     27              calcific                       +----------+--------+--------+--------+----------------------+--------+ ICA Prox  184     23      1-39%   calcific and irregular         +----------+--------+--------+--------+----------------------+--------+ ICA Mid   111     22              calcific and irregular         +----------+--------+--------+--------+----------------------+--------+ ICA Distal37      10                                             +----------+--------+--------+--------+----------------------+--------+ ECA       175     0                                              +----------+--------+--------+--------+----------------------+--------+  +----------+--------+-------+----------------+-------------------+           PSV cm/sEDV cmsDescribe        Arm Pressure (mmHG) +----------+--------+-------+----------------+-------------------+ [id number]            Multiphasic, WNL                    +----------+--------+-------+----------------+-------------------+  +---------+--------+--+--------+-+---------+ VertebralPSV cm/s37EDV cm/s8Antegrade +---------+--------+--+--------+-+---------+    Left Carotid Findings: +----------+--------+--------+--------+------------+--------+           PSV cm/sEDV cm/sStenosisDescribe     Comments +----------+--------+--------+--------+------------+--------+ CCA Prox  86      11                                   +----------+--------+--------+--------+------------+--------+ CCA Mid   71  17              calcific             +----------+--------+--------+--------+------------+--------+ CCA Distal94      23              heterogenousstent    +----------+--------+--------+--------+------------+--------+ ICA Prox  263     51      40-59%              stent    +----------+--------+--------+--------+------------+--------+ ICA Mid   248     46              heterogenous         +----------+--------+--------+--------+------------+--------+ ICA Distal270     42                                   +----------+--------+--------+--------+------------+--------+ ECA       62      0                                    +----------+--------+--------+--------+------------+--------+  +----------+--------+--------+----------------+-------------------+ SubclavianPSV cm/sEDV cm/sDescribe        Arm Pressure (mmHG) +----------+--------+--------+----------------+-------------------+           267             Multiphasic, WNL                    +----------+--------+--------+----------------+-------------------+  +---------+--------+--+--------+--+---------+ VertebralPSV cm/s86EDV cm/s16Antegrade +---------+--------+--+--------+--+---------+    Left Stent(s): +--------------------------+--------+--------+---------------+--------+--------+ Distal CCA and proximal   PSV cm/sEDV cm/sStenosis       WaveformComments ICA                                                                       +--------------------------+--------+--------+---------------+--------+--------+ Prox to Stent             71      11                                       +--------------------------+--------+--------+---------------+--------+--------+ Proximal Stent            93      19                                      +--------------------------+--------+--------+---------------+--------+--------+ Mid Stent                 318     57      50-75% stenosis                 +--------------------------+--------+--------+---------------+--------+--------+ Distal Stent              288     48                                      +--------------------------+--------+--------+---------------+--------+--------+ Distal  to Stent           248     46                                      +--------------------------+--------+--------+---------------+--------+--------+ Summary: Right Carotid: Velocities in the right ICA are consistent with a 1-39% stenosis.  Left Carotid: Velocities in the left ICA are consistent with a 40-59% stenosis.               In stent velocities suggest 50-75% in stent restenosis. Vertebrals:  Bilateral vertebral arteries demonstrate antegrade flow. Subclavians: Normal flow hemodynamics were seen in bilateral subclavian              arteries.   Bilateral LE Arterial Duplex (04-11-19);  +----------+--------+-----+---------------+----------+--------+ RIGHT     PSV cm/sRatioStenosis       Waveform  Comments +----------+--------+-----+---------------+----------+--------+ CFA Distal142                         biphasic           +----------+--------+-----+---------------+----------+--------+ DFA       293          50-74% stenosisbiphasic           +----------+--------+-----+---------------+----------+--------+ SFA Prox  119                         biphasic           +----------+--------+-----+---------------+----------+--------+ POP Prox  53                          biphasic           +----------+--------+-----+---------------+----------+--------+ POP Distal75                           biphasic           +----------+--------+-----+---------------+----------+--------+ ATA Distal98                          monophasic         +----------+--------+-----+---------------+----------+--------+ PTA Prox  57                          monophasic         +----------+--------+-----+---------------+----------+--------+ PTA Distal30                          monophasic         +----------+--------+-----+---------------+----------+--------+  A focal velocity elevation of 293 cm/s was obtained at Proximal Profunda with post stenotic turbulence with a VR of 2.1. Findings are characteristic of 50-74% stenosis.    Right Stent(s): +-----------------------------------+--------+---------------+--------+--------+ Proximal superficial femoral arteryPSV cm/sStenosis       WaveformComments +-----------------------------------+--------+---------------+--------+--------+ Prox to Stent                      83                     biphasic         +-----------------------------------+--------+---------------+--------+--------+ Proximal Stent                     91  biphasic         +-----------------------------------+--------+---------------+--------+--------+ Mid Stent                          374     50-99% stenosisbiphasic         +-----------------------------------+--------+---------------+--------+--------+ Distal Stent                       72                     biphasic         +-----------------------------------+--------+---------------+--------+--------+ Distal to Stent                    61                     biphasic         +-----------------------------------+--------+---------------+--------+--------+  50-99% mid stent stenosis.    +----------+--------+-----+--------+----------+--------+ LEFT      PSV cm/sRatioStenosisWaveform   Comments +----------+--------+-----+--------+----------+--------+ CFA Distal247                  biphasic           +----------+--------+-----+--------+----------+--------+ DFA       294                  biphasic           +----------+--------+-----+--------+----------+--------+ SFA Prox  125                  monophasic         +----------+--------+-----+--------+----------+--------+ SFA Mid   413                  monophasic         +----------+--------+-----+--------+----------+--------+ SFA Distal110                  monophasic         +----------+--------+-----+--------+----------+--------+ POP Prox  43                   monophasic         +----------+--------+-----+--------+----------+--------+ POP Distal55                   monophasic         +----------+--------+-----+--------+----------+--------+ ATA Distal60                   monophasic         +----------+--------+-----+--------+----------+--------+ PTA Distal31                   monophasic         +----------+--------+-----+--------+----------+--------+  A focal velocity elevation of 413 cm/s was obtained at Mid superficial femoral artery with post stenotic turbulence with a VR of 5.3. Findings are characteristic of 75-99% stenosis.   Summary: Right: 50-74% stenosis noted in the deep femoral artery. Stenosis is noted within the proximal superficial femoral artery stent.  Left: 75-99% stenosis noted in the superficial femoral artery.    ABI (Date: 04/11/2019): ABI Findings: +---------+------------------+-----+----------+--------+ Right    Rt Pressure (mmHg)IndexWaveform  Comment  +---------+------------------+-----+----------+--------+ Brachial 167                                       +---------+------------------+-----+----------+--------+ ATA      65  0.39 monophasic          +---------+------------------+-----+----------+--------+ PTA      105               0.63 monophasic         +---------+------------------+-----+----------+--------+ Great Toe34                0.20                    +---------+------------------+-----+----------+--------+  +---------+------------------+-----+----------+-------+ Left     Lt Pressure (mmHg)IndexWaveform  Comment +---------+------------------+-----+----------+-------+ Brachial 161                                      +---------+------------------+-----+----------+-------+ ATA      98                0.59 monophasic        +---------+------------------+-----+----------+-------+ PTA      93                0.56 monophasic        +---------+------------------+-----+----------+-------+ Great Toe16                0.10                   +---------+------------------+-----+----------+-------+  +-------+-----------+-----------+------------+------------+ ABI/TBIToday's ABIToday's TBIPrevious ABIPrevious TBI +-------+-----------+-----------+------------+------------+ Right  0.63       0.20       0.72        0.12         +-------+-----------+-----------+------------+------------+ Left   0.59       0.10       0.88        0.22         +-------+-----------+-----------+------------+------------+  Right ABIs with slight decline compared to prior study on 07/08/2018.  Left ABIs appear decreased compared to prior study on 07/08/2018.   Summary: Right: Resting right ankle-brachial index indicates moderate right lower extremity arterial disease. The right toe-brachial index is abnormal. RT great toe pressure = 34 mmHg.  Left: Resting left ankle-brachial index indicates moderate left lower extremity arterial disease. The left toe-brachial index is abnormal. LT Great toe pressure = 16 mmHg.    PLAN:   Based on today's exam, HPI, and non-invasive vascular lab results, the  patient will follow up in in 1-3 weeks with Dr. Oneida Alar to discuss significant asymptomatic arterial stenoses in both lower extremities, and decline in bilateral ABI.   Graduated walking program discussed and how to achieve. In addition, daily seated leg exercised discussed and demonstrated.  I advised pt to notify us if he develops concerns re the circulation in his feet or legs   I discussed in depth with the patient the nature of atherosclerosis, and emphasized the importance of maximal medical management including strict control of blood pressure, blood glucose, and lipid levels, obtaining regular exercise, and cessation of smoking.  The patient is aware that without maximal medical management the underlying atherosclerotic disease process will progress, limiting the benefit of any interventions.  The patient was given information about stroke prevention and what symptoms should prompt the patient to seek immediate medical care.  The patient was given information about PAD including signs, symptoms, treatment, what symptoms should prompt the patient to seek immediate medical care, and risk reduction measures to take.  Thank you for allowing [place] to participate in this patient's care.  Vinnie Level Teneisha Gignac, RN,  MSN, FNP-C [facility] Office: [phone]  Clinic MD: Trula Slade on call until 5 pm 04/11/2019 3:53 PM

## 2019-05-05 ENCOUNTER — Ambulatory Visit (INDEPENDENT_AMBULATORY_CARE_PROVIDER_SITE_OTHER): Payer: Medicare HMO | Admitting: Vascular Surgery

## 2019-05-05 ENCOUNTER — Encounter: Payer: Self-pay | Admitting: Vascular Surgery

## 2019-05-05 ENCOUNTER — Other Ambulatory Visit: Payer: Self-pay

## 2019-05-05 DIAGNOSIS — I739 Peripheral vascular disease, unspecified: Secondary | ICD-10-CM | POA: Diagnosis not present

## 2019-05-05 NOTE — Progress Notes (Signed)
Virtual Visit via Telephone Note  I connected with [name] on 05/05/2019 using the [employer] by telephone and verified that I was speaking with the correct person using two identifiers. Patient was located at home and accompanied by himself. I am located at our office on [address]  Virtual video visit was aborted due to the patient's unfamiliarity with computer systems.  A telephone visit was commenced.   The limitations of evaluation and management by telemedicine and the availability of in person appointments have been previously discussed with the patient and are documented in the patients chart. The patient expressed understanding and consented to proceed.  PCP: Duke, [name], MD  History of Present Illness: [name] is a 73 y.o. male who returns for follow-up today.  He has previously undergone carotid stenting as well as a right superficial femoral artery stent.  He was recently seen by her nurse practitioner noted to have decline in his ABIs on the right leg.  He presents today for further follow-up.  Recent ABIs on June 19 were 0.6 bilaterally.  Previous ABIs in 2019 were 0.97 on the right 0.7 on the left.  Patient does not really describe claudication symptoms currently.  He states he does have some pain behind his left knee but this only occurs while he is driving.  He has no problems in his right leg.  He is not bothered in his walking.  He has no open wounds on his feet.  He states that his hips do hurt sometimes when he walks.  This sounds more degenerative.  He is on a statin and Plavix.  He denies any symptoms of TIA amaurosis or stroke.  Recent carotid duplex exam showed moderate carotid stenosis.  Review of systems: He has shortness of breath with exertion.  He denies chest pain.   Past Medical History:  Diagnosis Date  . Arthritis   . CAD (coronary artery disease)   . Carotid artery occlusion   . Diabetes mellitus    fasting 100-120s  . Dizziness   .  Dysrhythmia    skips beats  . GERD (gastroesophageal reflux disease)   . History of kidney stones   . Hyperlipidemia   . Hypertension   . IHD (ischemic heart disease)    Prior CABG in 2003  . Normal nuclear stress test June 2012  . Peripheral vascular disease ([place])   . Torn rotator cuff     Past Surgical History:  Procedure Laterality Date  . ABDOMINAL AORTAGRAM N/A 12/21/2013   Procedure: ABDOMINAL Maxcine Ham;  Surgeon: Elam Dutch, MD;  Location: [facility] CATH LAB;  Service: Cardiovascular;  Laterality: N/A;  . ABDOMINAL AORTOGRAM N/A 11/13/2017   Procedure: ABDOMINAL AORTOGRAM;  Surgeon: Elam Dutch, MD;  Location: [place] CV LAB;  Service: Cardiovascular;  Laterality: N/A;  . APPENDECTOMY    . ARCH AORTOGRAM  08/26/2013   Procedure: ARCH AORTOGRAM;  Surgeon: Elam Dutch, MD;  Location: [facility] CATH LAB;  Service: Cardiovascular;;  . CARDIAC CATHETERIZATION  05/03/2002   EF 40-45%  . CARDIOVASCULAR STRESS TEST  08/10/2006   EF 65%  . CAROTID ENDARTERECTOMY    . CAROTID STENT INSERTION Left 08/26/2013   Procedure: CAROTID STENT INSERTION;  Surgeon: Elam Dutch, MD;  Location: [facility] CATH LAB;  Service: Cardiovascular;  Laterality: Left;  internal carotid  . CORONARY ARTERY BYPASS GRAFT  04/2002  . DOPPLER ECHOCARDIOGRAPHY  08/12/2002   EF 80-85%  . ENDARTERECTOMY Left 07/20/2013  Procedure: ATTEMPTED ENDARTERECTOMY CAROTID;  Surgeon: Conrad Lauderdale, MD;  Location: [place];  Service: Vascular;  Laterality: Left;  . HERNIA REPAIR    . LOWER EXTREMITY ANGIOGRAPHY  11/13/2017   Procedure: Lower Extremity Angiography;  Surgeon: Elam Dutch, MD;  Location: [place] CV LAB;  Service: Cardiovascular;;  . PERIPHERAL VASCULAR INTERVENTION Right 11/13/2017   Procedure: PERIPHERAL VASCULAR INTERVENTION;  Surgeon: Elam Dutch, MD;  Location: [place] CV LAB;  Service: Cardiovascular;  Laterality: Right;  superficial femoral  . SHOULDER SURGERY      Current Meds   Medication Sig  . amLODipine-benazepril (LOTREL) 5-20 MG capsule Take 1 capsule by mouth daily.  Marland Kitchen aspirin EC 81 MG tablet Take 81 mg by mouth daily.  . clopidogrel (PLAVIX) 75 MG tablet Take 1 tablet (75 mg total) by mouth daily.  . hydrochlorothiazide (HYDRODIURIL) 25 MG tablet Take 1 tablet by mouth once daily  . insulin NPH Human (HUMULIN N,NOVOLIN N) 100 UNIT/ML injection Inject 45 Units into the skin 2 (two) times daily.  . insulin regular (NOVOLIN R,HUMULIN R) 100 units/mL injection Inject 20 Units into the skin 3 (three) times daily after meals.  . metFORMIN (GLUCOPHAGE-XR) 500 MG 24 hr tablet Take 500 mg by mouth 2 (two) times daily with a meal.   . metoprolol tartrate (LOPRESSOR) 50 MG tablet Take 1 tablet (50 mg total) by mouth 2 (two) times daily.  . nitroGLYCERIN (NITROSTAT) 0.4 MG SL tablet Place 1 tablet (0.4 mg total) under the tongue every 5 (five) minutes as needed for chest pain.  Marland Kitchen omeprazole (PRILOSEC) 20 MG capsule Take 20 mg by mouth daily.  . rosuvastatin (CRESTOR) 20 MG tablet Take 1 tablet by mouth once daily    Assessment and Plan: Patient with known peripheral arterial disease although currently asymptomatic.  He has had a decline in his right ABI but really has no symptoms.  Certainly there is a possibility his stent has occluded or narrowed.  However, due to current COVID conditions.  He is reluctant to proceed with any intervention.  I certainly understand this.  In light of this we will schedule him for follow-up ABIs and a repeat carotid duplex scan in 1 year.  He will call us sooner if he changes his mind regarding his leg or develops new symptoms.  He will also call us if he develops any symptoms of TIA amaurosis or stroke.  Follow Up Instructions:    I discussed the assessment and treatment plan with the patient. The patient was provided an opportunity to ask questions and all were answered. The patient agreed with the plan and demonstrated an understanding of  the instructions.   The patient was advised to call back or seek an in-person evaluation if the symptoms worsen or if the condition fails to improve as anticipated.  I spent 10 minutes with the patient via telephone encounter.   Signed, Ruta Hinds [facility] Office: [phone]  05/05/2019, 1:59 PM

## 2019-06-03 ENCOUNTER — Ambulatory Visit: Payer: Medicare HMO | Admitting: Cardiovascular Disease

## 2019-06-03 DIAGNOSIS — I739 Peripheral vascular disease, unspecified: Secondary | ICD-10-CM | POA: Diagnosis not present

## 2019-06-03 DIAGNOSIS — R972 Elevated prostate specific antigen [PSA]: Secondary | ICD-10-CM | POA: Diagnosis not present

## 2019-06-03 DIAGNOSIS — N281 Cyst of kidney, acquired: Secondary | ICD-10-CM | POA: Diagnosis not present

## 2019-06-03 DIAGNOSIS — I10 Essential (primary) hypertension: Secondary | ICD-10-CM | POA: Diagnosis not present

## 2019-06-03 DIAGNOSIS — E7800 Pure hypercholesterolemia, unspecified: Secondary | ICD-10-CM | POA: Diagnosis not present

## 2019-06-03 DIAGNOSIS — K5792 Diverticulitis of intestine, part unspecified, without perforation or abscess without bleeding: Secondary | ICD-10-CM | POA: Diagnosis not present

## 2019-06-03 DIAGNOSIS — I6529 Occlusion and stenosis of unspecified carotid artery: Secondary | ICD-10-CM | POA: Diagnosis not present

## 2019-06-03 DIAGNOSIS — I25810 Atherosclerosis of coronary artery bypass graft(s) without angina pectoris: Secondary | ICD-10-CM | POA: Diagnosis not present

## 2019-06-03 DIAGNOSIS — E1151 Type 2 diabetes mellitus with diabetic peripheral angiopathy without gangrene: Secondary | ICD-10-CM | POA: Diagnosis not present

## 2019-06-03 DIAGNOSIS — K2270 Barrett's esophagus without dysplasia: Secondary | ICD-10-CM | POA: Diagnosis not present

## 2019-06-03 DIAGNOSIS — K5900 Constipation, unspecified: Secondary | ICD-10-CM | POA: Diagnosis not present

## 2019-06-08 DIAGNOSIS — E1165 Type 2 diabetes mellitus with hyperglycemia: Secondary | ICD-10-CM | POA: Diagnosis not present

## 2019-06-08 DIAGNOSIS — E1140 Type 2 diabetes mellitus with diabetic neuropathy, unspecified: Secondary | ICD-10-CM | POA: Diagnosis not present

## 2019-06-08 DIAGNOSIS — E1142 Type 2 diabetes mellitus with diabetic polyneuropathy: Secondary | ICD-10-CM | POA: Diagnosis not present

## 2019-06-08 DIAGNOSIS — Z794 Long term (current) use of insulin: Secondary | ICD-10-CM | POA: Diagnosis not present

## 2019-06-08 DIAGNOSIS — I10 Essential (primary) hypertension: Secondary | ICD-10-CM | POA: Diagnosis not present

## 2019-06-08 DIAGNOSIS — E785 Hyperlipidemia, unspecified: Secondary | ICD-10-CM | POA: Diagnosis not present

## 2019-06-23 DIAGNOSIS — Z23 Encounter for immunization: Secondary | ICD-10-CM | POA: Diagnosis not present

## 2019-07-19 DIAGNOSIS — E113293 Type 2 diabetes mellitus with mild nonproliferative diabetic retinopathy without macular edema, bilateral: Secondary | ICD-10-CM | POA: Diagnosis not present

## 2019-07-20 ENCOUNTER — Other Ambulatory Visit: Payer: Self-pay | Admitting: Cardiovascular Disease

## 2019-07-28 ENCOUNTER — Ambulatory Visit: Payer: Medicare HMO | Admitting: Cardiovascular Disease

## 2019-08-02 DIAGNOSIS — R972 Elevated prostate specific antigen [PSA]: Secondary | ICD-10-CM | POA: Diagnosis not present

## 2019-08-14 ENCOUNTER — Other Ambulatory Visit: Payer: Self-pay | Admitting: Cardiovascular Disease

## 2019-08-30 ENCOUNTER — Telehealth (INDEPENDENT_AMBULATORY_CARE_PROVIDER_SITE_OTHER): Payer: Medicare HMO | Admitting: Cardiology

## 2019-08-30 ENCOUNTER — Encounter: Payer: Self-pay | Admitting: Cardiology

## 2019-08-30 ENCOUNTER — Other Ambulatory Visit: Payer: Self-pay

## 2019-08-30 VITALS — BP 135/67 | HR 63 | Ht 71.0 in | Wt 190.0 lb

## 2019-08-30 DIAGNOSIS — I779 Disorder of arteries and arterioles, unspecified: Secondary | ICD-10-CM | POA: Diagnosis not present

## 2019-08-30 DIAGNOSIS — E785 Hyperlipidemia, unspecified: Secondary | ICD-10-CM

## 2019-08-30 DIAGNOSIS — E119 Type 2 diabetes mellitus without complications: Secondary | ICD-10-CM | POA: Diagnosis not present

## 2019-08-30 DIAGNOSIS — I6523 Occlusion and stenosis of bilateral carotid arteries: Secondary | ICD-10-CM | POA: Diagnosis not present

## 2019-08-30 DIAGNOSIS — I10 Essential (primary) hypertension: Secondary | ICD-10-CM | POA: Diagnosis not present

## 2019-08-30 DIAGNOSIS — I2510 Atherosclerotic heart disease of native coronary artery without angina pectoris: Secondary | ICD-10-CM

## 2019-08-30 DIAGNOSIS — Z794 Long term (current) use of insulin: Secondary | ICD-10-CM | POA: Diagnosis not present

## 2019-08-30 MED ORDER — AMLODIPINE BESY-BENAZEPRIL HCL 5-20 MG PO CAPS: 1.0000 | ORAL_CAPSULE | Freq: Every day | ORAL | 3 refills | Status: DC

## 2019-08-30 MED ORDER — METOPROLOL TARTRATE 50 MG PO TABS: 50.0000 mg | ORAL_TABLET | Freq: Two times a day (BID) | ORAL | 3 refills | Status: DC

## 2019-08-30 MED ORDER — ROSUVASTATIN CALCIUM 20 MG PO TABS: 20.0000 mg | ORAL_TABLET | Freq: Every day | ORAL | 3 refills | Status: DC

## 2019-08-30 MED ORDER — HYDROCHLOROTHIAZIDE 25 MG PO TABS: 25.0000 mg | ORAL_TABLET | Freq: Every day | ORAL | 3 refills | Status: DC

## 2019-08-30 NOTE — Patient Instructions (Addendum)
Medication Instructions:  Your physician recommends that you continue on your current medications as directed. Please refer to the Current Medication list given to you today.' *If you need a refill on your cardiac medications before your next appointment, please call your pharmacy*  Lab Work: None   If you have labs (blood work) drawn today and your tests are completely normal, you will receive your results only by: Marland Kitchen MyChart Message (if you have MyChart) OR . A paper copy in the mail If you have any lab test that is abnormal or we need to change your treatment, we will call you to review the results.  Testing/Procedures: None   Follow-Up: At [facility], you and your health needs are our priority.  As part of our continuing mission to provide you with exceptional heart care, we have created designated Provider Care Teams.  These Care Teams include your primary Cardiologist (physician) and Advanced Practice Providers (APPs -  Physician Assistants and Nurse Practitioners) who all work together to provide you with the care you need, when you need it.  Your next appointment:   6 months  The format for your next appointment:   Either In Person or Virtual  Provider:   You may see Mertie Moores, MD or one of the following Advanced Practice Providers on your designated Care Team:    Richardson Dopp, PA-C  [employer], [place]  Daune Perch, NP   Other Instructions   Lifestyle Modifications to Prevent and Treat Heart Disease -Recommend heart healthy/Mediterranean diet, with whole grains, fruits, vegetables, fish, lean meats, nuts, olive oil and avocado oil.  -Limit salt intake to less than 2000 mg per day.  -Recommend moderate walking, starting slowly with a few minutes and working up to 3-5 times/week for 30-50 minutes each session. Aim for at least 150 minutes.week. Goal should be pace of 3 miles/hours, or walking 1.5 miles in 30 minutes -Recommend avoidance of tobacco products.  Avoid excess alcohol. -Keep blood pressure well controlled, ideally less than 130/80.     Mediterranean Diet A Mediterranean diet refers to food and lifestyle choices that are based on the traditions of countries located on the [employer]. This way of eating has been shown to help prevent certain conditions and improve outcomes for people who have chronic diseases, like kidney disease and heart disease. What are tips for following this plan? Lifestyle  Cook and eat meals together with your family, when possible.  Drink enough fluid to keep your urine clear or pale yellow.  Be physically active every day. This includes: ? Aerobic exercise like running or swimming. ? Leisure activities like gardening, walking, or housework.  Get 7-8 hours of sleep each night.  If recommended by your health care provider, drink red wine in moderation. This means 1 glass a day for nonpregnant women and 2 glasses a day for men. A glass of wine equals 5 oz (150 mL). Reading food labels   Check the serving size of packaged foods. For foods such as rice and pasta, the serving size refers to the amount of cooked product, not dry.  Check the total fat in packaged foods. Avoid foods that have saturated fat or trans fats.  Check the ingredients list for added sugars, such as corn syrup. Shopping  At the grocery store, buy most of your food from the areas near the walls of the store. This includes: ? Fresh fruits and vegetables (produce). ? Grains, beans, nuts, and seeds. Some of these may be available  in unpackaged forms or large amounts (in bulk). ? Fresh seafood. ? Poultry and eggs. ? Low-fat dairy products.  Buy whole ingredients instead of prepackaged foods.  Buy fresh fruits and vegetables in-season from local farmers markets.  Buy frozen fruits and vegetables in resealable bags.  If you do not have access to quality fresh seafood, buy precooked frozen shrimp or canned fish, such as tuna,  salmon, or sardines.  Buy small amounts of raw or cooked vegetables, salads, or olives from the deli or salad bar at your store.  Stock your pantry so you always have certain foods on hand, such as olive oil, canned tuna, canned tomatoes, rice, pasta, and beans. Cooking  Cook foods with extra-virgin olive oil instead of using butter or other vegetable oils.  Have meat as a side dish, and have vegetables or grains as your main dish. This means having meat in small portions or adding small amounts of meat to foods like pasta or stew.  Use beans or vegetables instead of meat in common dishes like chili or lasagna.  Experiment with different cooking methods. Try roasting or broiling vegetables instead of steaming or sauteing them.  Add frozen vegetables to soups, stews, pasta, or rice.  Add nuts or seeds for added healthy fat at each meal. You can add these to yogurt, salads, or vegetable dishes.  Marinate fish or vegetables using olive oil, lemon juice, garlic, and fresh herbs. Meal planning   Plan to eat 1 vegetarian meal one day each week. Try to work up to 2 vegetarian meals, if possible.  Eat seafood 2 or more times a week.  Have healthy snacks readily available, such as: ? Vegetable sticks with hummus. ? [place] yogurt. ? Fruit and nut trail mix.  Eat balanced meals throughout the week. This includes: ? Fruit: 2-3 servings a day ? Vegetables: 4-5 servings a day ? Low-fat dairy: 2 servings a day ? Fish, poultry, or lean meat: 1 serving a day ? Beans and legumes: 2 or more servings a week ? Nuts and seeds: 1-2 servings a day ? Whole grains: 6-8 servings a day ? Extra-virgin olive oil: 3-4 servings a day  Limit red meat and sweets to only a few servings a month What are my food choices?  Mediterranean diet ? Recommended  Grains: Whole-grain pasta. Brown rice. Bulgar wheat. Polenta. Couscous. Whole-wheat bread. [place].  Vegetables: Artichokes. Beets. Broccoli.  Cabbage. Carrots. Eggplant. Green beans. Chard. Kale. Spinach. Onions. Leeks. Peas. Squash. Tomatoes. Peppers. Radishes.  Fruits: Apples. Apricots. Avocado. Berries. Bananas. Cherries. Dates. Figs. Grapes. Lemons. Melon. Oranges. Peaches. Plums. Pomegranate.  Meats and other protein foods: Beans. Almonds. Sunflower seeds. Pine nuts. Peanuts. Rainbow. Salmon. Scallops. Shrimp. [place]. Tilapia. Clams. Oysters. Eggs.  Dairy: Low-fat milk. Cheese. Greek yogurt.  Beverages: Water. Red wine. Herbal tea.  Fats and oils: Extra virgin olive oil. Avocado oil. Grape seed oil.  Sweets and desserts: [place] yogurt with honey. Baked apples. Poached pears. Trail mix.  Seasoning and other foods: Basil. Cilantro. Coriander. Cumin. Mint. Parsley. Sage. Rosemary. Tarragon. Garlic. Oregano. Thyme. Pepper. Balsalmic vinegar. Tahini. Hummus. Tomato sauce. Olives. Mushrooms. ? Limit these  Grains: Prepackaged pasta or rice dishes. Prepackaged cereal with added sugar.  Vegetables: Deep fried potatoes (french fries).  Fruits: Fruit canned in syrup.  Meats and other protein foods: Beef. Pork. Lamb. Poultry with skin. Hot dogs. Berniece Salines.  Dairy: Ice cream. Sour cream. Whole milk.  Beverages: Juice. Sugar-sweetened soft drinks. Beer. Liquor and spirits.  Fats and  oils: Butter. Canola oil. Vegetable oil. Beef fat (tallow). Lard.  Sweets and desserts: Cookies. Cakes. Pies. Candy.  Seasoning and other foods: Mayonnaise. Premade sauces and marinades. The items listed may not be a complete list. Talk with your dietitian about what dietary choices are right for you. Summary  The Mediterranean diet includes both food and lifestyle choices.  Eat a variety of fresh fruits and vegetables, beans, nuts, seeds, and whole grains.  Limit the amount of red meat and sweets that you eat.  Talk with your health care provider about whether it is safe for you to drink red wine in moderation. This means 1 glass a day for nonpregnant  women and 2 glasses a day for men. A glass of wine equals 5 oz (150 mL). This information is not intended to replace advice given to you by your health care provider. Make sure you discuss any questions you have with your health care provider. Document Released: 06/05/2016 Document Revised: 06/12/2016 Document Reviewed: 06/05/2016 Elsevier Patient Education  2020 [place], NP

## 2019-08-30 NOTE — Progress Notes (Signed)
Virtual Visit via Telephone Note   This visit type was conducted due to national recommendations for restrictions regarding the COVID-19 Pandemic (e.g. social distancing) in an effort to limit this patient's exposure and mitigate transmission in our community.  Due to his co-morbid illnesses, this patient is at least at moderate risk for complications without adequate follow up.  This format is felt to be most appropriate for this patient at this time.  The patient did not have access to video technology/had technical difficulties with video requiring transitioning to audio format only (telephone).  All issues noted in this document were discussed and addressed.  No physical exam could be performed with this format.  Please refer to the patient's chart for his  consent to telehealth for [facility].   Date:  08/30/2019   ID:  [name], DOB 10-Jan-1946, MRN [id number]  Patient Location: Home Provider Location: Home  PCP:  [name], [name], MD  Cardiologist:  Mertie Moores, MD  Electrophysiologist:  None   Evaluation Performed:  Follow-Up Visit  Chief Complaint:  CAD  History of Present Illness:    [name] is a 73 y.o. male with CAD s/p CABG 2003, hyperlipidemia, hypertension, carotid artery disease s/p left and right CEA, PAD s/p right leg stent 2015, diabetes mellitus type 2.  The patient had a normal nuclear stress test in June 2012 prior to rotator cuff surgery.  Patient had an appointment with Dr. Oneida Alar on 05/05/2019.  He has had worsening of his ABIs but apparently no claudication symptoms.  With the Covid pandemic the patient preferred not to pursue any intervention at that time.  Recent carotid Dopplers showed moderate carotid stenosis but no symptoms of TIA or stroke at his office visit.  Dr. Oneida Alar plans to repeat studies in 1 year (04/2020).   [name] says that he is doing well. He is retired from doing maintenance at [employer] but still works a few hours a day which  he says really helps him stay active and feel better. He denies chest discomfort, shortness of breath, orthopnea, edema.  He says that his legs get tired after walking a moderate distance but no pain.   The patient is social distancing, preferring not to go out any place unless very necessary.  He is especially careful about going to healthcare facilities and doctors offices as he fears this may be a place at higher risk.  I reassured him that we take all precautions.   Labs per primary care noted through the [place] and system: Lipids 11/23/2018: TC 122, HDL 33, LDL 71, triglycerides 90 A1c 11/23/2018: 7.1 Serum creatinine 11/23/2018: 1.1   The patient does not have symptoms concerning for COVID-19 infection (fever, chills, cough, or new shortness of breath).    Past Medical History:  Diagnosis Date  . Arthritis   . CAD (coronary artery disease)   . Carotid artery occlusion   . Diabetes mellitus    fasting 100-120s  . Dizziness   . Dysrhythmia    skips beats  . GERD (gastroesophageal reflux disease)   . History of kidney stones   . Hyperlipidemia   . Hypertension   . IHD (ischemic heart disease)    Prior CABG in 2003  . Normal nuclear stress test June 2012  . Peripheral vascular disease ([place])   . Torn rotator cuff    Past Surgical History:  Procedure Laterality Date  . ABDOMINAL AORTAGRAM N/A 12/21/2013   Procedure: ABDOMINAL AORTAGRAM;  Surgeon:  Elam Dutch, MD;  Location: [facility] CATH LAB;  Service: Cardiovascular;  Laterality: N/A;  . ABDOMINAL AORTOGRAM N/A 11/13/2017   Procedure: ABDOMINAL AORTOGRAM;  Surgeon: Elam Dutch, MD;  Location: [place] CV LAB;  Service: Cardiovascular;  Laterality: N/A;  . APPENDECTOMY    . ARCH AORTOGRAM  08/26/2013   Procedure: ARCH AORTOGRAM;  Surgeon: Elam Dutch, MD;  Location: [facility] CATH LAB;  Service: Cardiovascular;;  . CARDIAC CATHETERIZATION  05/03/2002   EF 40-45%  . CARDIOVASCULAR STRESS TEST  08/10/2006   EF 65%  . CAROTID  ENDARTERECTOMY    . CAROTID STENT INSERTION Left 08/26/2013   Procedure: CAROTID STENT INSERTION;  Surgeon: Elam Dutch, MD;  Location: [facility] CATH LAB;  Service: Cardiovascular;  Laterality: Left;  internal carotid  . CORONARY ARTERY BYPASS GRAFT  04/2002  . DOPPLER ECHOCARDIOGRAPHY  08/12/2002   EF 80-85%  . ENDARTERECTOMY Left 07/20/2013   Procedure: ATTEMPTED ENDARTERECTOMY CAROTID;  Surgeon: Conrad Sharpsburg, MD;  Location: [place];  Service: Vascular;  Laterality: Left;  . HERNIA REPAIR    . LOWER EXTREMITY ANGIOGRAPHY  11/13/2017   Procedure: Lower Extremity Angiography;  Surgeon: Elam Dutch, MD;  Location: [place] CV LAB;  Service: Cardiovascular;;  . PERIPHERAL VASCULAR INTERVENTION Right 11/13/2017   Procedure: PERIPHERAL VASCULAR INTERVENTION;  Surgeon: Elam Dutch, MD;  Location: [place] CV LAB;  Service: Cardiovascular;  Laterality: Right;  superficial femoral  . SHOULDER SURGERY       Current Meds  Medication Sig  . amLODipine-benazepril (LOTREL) 5-20 MG capsule Take 1 capsule by mouth daily.  Marland Kitchen aspirin EC 81 MG tablet Take 81 mg by mouth daily.  . clopidogrel (PLAVIX) 75 MG tablet Take 1 tablet (75 mg total) by mouth daily.  . hydrochlorothiazide (HYDRODIURIL) 25 MG tablet Take 1 tablet (25 mg total) by mouth daily.  . insulin NPH Human (HUMULIN N,NOVOLIN N) 100 UNIT/ML injection Inject 45 Units into the skin 2 (two) times daily.  . insulin regular (NOVOLIN R,HUMULIN R) 100 units/mL injection Inject 20 Units into the skin 3 (three) times daily after meals.  . metFORMIN (GLUCOPHAGE-XR) 500 MG 24 hr tablet Take 500 mg by mouth 2 (two) times daily with a meal.   . metoprolol tartrate (LOPRESSOR) 50 MG tablet Take 1 tablet (50 mg total) by mouth 2 (two) times daily.  . nitroGLYCERIN (NITROSTAT) 0.4 MG SL tablet Place 1 tablet (0.4 mg total) under the tongue every 5 (five) minutes as needed for chest pain.  Marland Kitchen omeprazole (PRILOSEC) 20 MG capsule Take 20 mg by mouth  daily.  . rosuvastatin (CRESTOR) 20 MG tablet Take 1 tablet (20 mg total) by mouth daily.  . [DISCONTINUED] amLODipine-benazepril (LOTREL) 5-20 MG capsule Take 1 capsule by mouth daily. Please make overdue appt with Dr. Acie Fredrickson before anymore refills. 1st attempt  . [DISCONTINUED] hydrochlorothiazide (HYDRODIURIL) 25 MG tablet Take 1 tablet (25 mg total) by mouth daily. Please keep upcoming appt in October with Dr. Acie Fredrickson for future refills. Thank you  . [DISCONTINUED] metoprolol tartrate (LOPRESSOR) 50 MG tablet Take 1 tablet (50 mg total) by mouth 2 (two) times daily. Please make overdue appt with Dr. Acie Fredrickson before anymore refills. 1st attempt  . [DISCONTINUED] rosuvastatin (CRESTOR) 20 MG tablet Take 1 tablet (20 mg total) by mouth daily. Please keep upcoming appt in October with Dr. Acie Fredrickson for future refills. Thank you     Allergies:   Lipitor [atorvastatin calcium], Sulfa drugs cross reactors, and Codeine  Social History   Tobacco Use  . Smoking status: Former Smoker    Quit date: 10/27/2001    Years since quitting: 17.8  . Smokeless tobacco: Never Used  Substance Use Topics  . Alcohol use: No    Alcohol/week: 0.0 standard drinks  . Drug use: No     Family Hx: The patient's family history includes Diabetes in his brother, daughter, mother, sister, and sister; Heart attack in his father and mother; Heart disease in his daughter, father, and mother; Hypertension in his daughter, father, and mother.  ROS:   Please see the history of present illness.     All other systems reviewed and are negative.   Prior CV studies:   The following studies were reviewed today:  Carotid artery duplex 04/11/2019 Summary: Right Carotid: Velocities in the right ICA are consistent with a 1-39% stenosis.  Left Carotid: Velocities in the left ICA are consistent with a 40-59% stenosis.               In stent velocities suggest 50-75% in stent restenosis.  Vertebrals:  Bilateral vertebral arteries  demonstrate antegrade flow. Subclavians: Normal flow hemodynamics were seen in bilateral subclavian              arteries.  ABIs 04/11/2019 Summary: Right: 0.63 Resting right ankle-brachial index indicates moderate right lower extremity arterial disease. The right toe-brachial index is abnormal. RT great toe pressure = 34 mmHg.  Left: 0.59 Resting left ankle-brachial index indicates moderate left lower extremity arterial disease. The left toe-brachial index is abnormal. LT Great toe pressure = 16 mmHg.  Right ABIs appear essentially unchanged compared to prior study on 07/08/2018. Left ABIs appear decreased compared to prior study on 07/08/2018.     Labs/Other Tests and Data Reviewed:    EKG:  No ECG reviewed.  Recent Labs: No results found for requested labs within last 8760 hours.   Recent Lipid Panel Lab Results  Component Value Date/Time   CHOL 132 08/24/2018 08:25 AM   TRIG 149 08/24/2018 08:25 AM   HDL 34 (L) 08/24/2018 08:25 AM   CHOLHDL 3.9 08/24/2018 08:25 AM   CHOLHDL 3.2 06/13/2016 09:18 AM   LDLCALC 68 08/24/2018 08:25 AM    Wt Readings from Last 3 Encounters:  08/30/19 190 lb (86.2 kg)  04/11/19 187 lb (84.8 kg)  08/24/18 191 lb 1.9 oz (86.7 kg)     Objective:    Vital Signs:  BP 135/67   Pulse 63   Ht 5\' 11"  (1.803 m)   Wt 190 lb (86.2 kg)   BMI 26.50 kg/m    VITAL SIGNS:  reviewed GEN:  no acute distress RESPIRATORY:  No audible cough or wheezing over the phone.  Patient able to speak in full sentences. NEURO:  alert and oriented x 3, no obvious focal deficit PSYCH:  normal affect  ASSESSMENT & PLAN:    CAD s/p CABG 2003 -Patient continues on aspirin, Plavix, statin, beta-blocker -The patient continues to work part-time in maintenance and feels like he is fairly active.  He denies any exertional chest discomfort or shortness of breath. -Continue current therapy, heart healthy diet, exercise  Hypertensive heart disease -On  amlodipine-benazepril 5-20 mg daily, hydrochlorothiazide 25 mg daily, metoprolol 50 mg twice daily -Blood pressure is currently well controlled -Labs per PCP office indicate normal renal function  Hyperlipidemia -On rosuvastatin 20 mg daily -Lipids 11/23/2018: TC 122, HDL 33, LDL 71, triglycerides 90.  Continue current therapy  PAD -Followed by Dr.  Fields with prior right SFA stent with worsening ABIs but no significant claudication symptoms.  Patient declined further intervention at the time.  Continues on Plavix and statin. -Today patient reports that his legs get tired after walking a good distance but he has no leg pain.  Bilateral carotid artery disease -S/p bilateral carotid endarterectomies.  Followed by Dr. Oneida Alar.  Now has moderate left carotid artery narrowing.  No symptoms of TIA or stroke.  Plan for follow-up in July 2021. -Continues on statin, Plavix and aspirin.  Diabetes type 2 on insulin -Management per PCP with last A1c 7.1.  Adequate control.  COVID-19 Education: The signs and symptoms of COVID-19 were discussed with the patient and how to seek care for testing (follow up with PCP or arrange E-visit).  The importance of social distancing was discussed today.  Time:   Today, I have spent 10 minutes with the patient with telehealth technology discussing the above problems.     Medication Adjustments/Labs and Tests Ordered: Current medicines are reviewed at length with the patient today.  Concerns regarding medicines are outlined above.   Tests Ordered: No orders of the defined types were placed in this encounter.   Medication Changes: Meds ordered this encounter  Medications  . rosuvastatin (CRESTOR) 20 MG tablet    Sig: Take 1 tablet (20 mg total) by mouth daily.    Dispense:  90 tablet    Refill:  3  . metoprolol tartrate (LOPRESSOR) 50 MG tablet    Sig: Take 1 tablet (50 mg total) by mouth 2 (two) times daily.    Dispense:  180 tablet    Refill:  3  .  hydrochlorothiazide (HYDRODIURIL) 25 MG tablet    Sig: Take 1 tablet (25 mg total) by mouth daily.    Dispense:  90 tablet    Refill:  3  . amLODipine-benazepril (LOTREL) 5-20 MG capsule    Sig: Take 1 capsule by mouth daily.    Dispense:  90 capsule    Refill:  3    Follow Up:  Either In Person or Virtual in 6 month(s)  Signed, Daune Perch, NP  08/30/2019 8:51 AM    [place]

## 2019-09-05 DIAGNOSIS — R972 Elevated prostate specific antigen [PSA]: Secondary | ICD-10-CM | POA: Diagnosis not present

## 2019-09-08 DIAGNOSIS — Z794 Long term (current) use of insulin: Secondary | ICD-10-CM | POA: Diagnosis not present

## 2019-09-08 DIAGNOSIS — I10 Essential (primary) hypertension: Secondary | ICD-10-CM | POA: Diagnosis not present

## 2019-09-08 DIAGNOSIS — E1165 Type 2 diabetes mellitus with hyperglycemia: Secondary | ICD-10-CM | POA: Diagnosis not present

## 2019-09-08 DIAGNOSIS — E785 Hyperlipidemia, unspecified: Secondary | ICD-10-CM | POA: Diagnosis not present

## 2019-09-08 DIAGNOSIS — E1142 Type 2 diabetes mellitus with diabetic polyneuropathy: Secondary | ICD-10-CM | POA: Diagnosis not present

## 2019-09-13 DIAGNOSIS — R972 Elevated prostate specific antigen [PSA]: Secondary | ICD-10-CM | POA: Diagnosis not present

## 2019-09-13 DIAGNOSIS — N5201 Erectile dysfunction due to arterial insufficiency: Secondary | ICD-10-CM | POA: Diagnosis not present

## 2019-10-05 ENCOUNTER — Ambulatory Visit: Payer: Medicare HMO | Admitting: Cardiovascular Disease

## 2019-10-31 DIAGNOSIS — R972 Elevated prostate specific antigen [PSA]: Secondary | ICD-10-CM | POA: Diagnosis not present

## 2019-10-31 DIAGNOSIS — C61 Malignant neoplasm of prostate: Secondary | ICD-10-CM | POA: Diagnosis not present

## 2019-11-15 DIAGNOSIS — C61 Malignant neoplasm of prostate: Secondary | ICD-10-CM | POA: Diagnosis not present

## 2019-11-17 ENCOUNTER — Encounter: Payer: Self-pay | Admitting: *Deleted

## 2019-11-25 ENCOUNTER — Ambulatory Visit: Payer: Medicare HMO

## 2019-11-26 ENCOUNTER — Ambulatory Visit: Payer: Medicare HMO

## 2019-11-28 ENCOUNTER — Encounter: Payer: Self-pay | Admitting: Radiation Oncology

## 2019-11-28 NOTE — Progress Notes (Signed)
GU Location of Tumor / Histology: prostatic adenocarcinoma  If Prostate Cancer, Gleason Score is (4 + 3) and PSA is (9.25). Prostate volume: 17.14 grams.  [name] explains he saw his PCP in February 2020 for a routine physical. He goes onto say he was told his PSA was elevated and he was referred to Dr. Louis Meckel for further evaluation.  Biopsies of prostate (if applicable) revealed:   Past/Anticipated interventions by urology, if any: prostate biopsy, ct abdomen/pelvis, referral for consideration of radiotherapy  Past/Anticipated interventions by medical oncology, if any: no  Weight changes, if any: denies  Bowel/Bladder complaints, if any: IPSS 3. SHIM 1. Mild to moderate erectile dysfunction. Mild lower urinary tract symptoms. Denies dysuria, hematuria, urinary leakage or incontinence. Denies any bowel complaints.   Nausea/Vomiting, if any: denies   Pain issues, if any:  denies  SAFETY ISSUES:  Prior radiation? denies  Pacemaker/ICD? denies  Possible current pregnancy? no, male patient  Is the patient on methotrexate? denies  Current Complaints / other details:  74 year old male. 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  Denies a family history of prostate cancer.   Patient weighing surgery vs radiation

## 2019-11-29 ENCOUNTER — Ambulatory Visit
Admission: RE | Admit: 2019-11-29 | Discharge: 2019-11-29 | Disposition: A | Discharge status: Home / Self Care | Payer: Medicare HMO | Source: Ambulatory Visit | Attending: Radiation Oncology | Admitting: Radiation Oncology

## 2019-11-29 ENCOUNTER — Encounter: Payer: Self-pay | Admitting: Radiation Oncology

## 2019-11-29 ENCOUNTER — Other Ambulatory Visit: Payer: Self-pay

## 2019-11-29 VITALS — Ht 71.0 in | Wt 190.0 lb

## 2019-11-29 DIAGNOSIS — R972 Elevated prostate specific antigen [PSA]: Secondary | ICD-10-CM | POA: Diagnosis not present

## 2019-11-29 DIAGNOSIS — Z125 Encounter for screening for malignant neoplasm of prostate: Secondary | ICD-10-CM | POA: Diagnosis not present

## 2019-11-29 DIAGNOSIS — C61 Malignant neoplasm of prostate: Secondary | ICD-10-CM | POA: Insufficient documentation

## 2019-11-29 DIAGNOSIS — E1151 Type 2 diabetes mellitus with diabetic peripheral angiopathy without gangrene: Secondary | ICD-10-CM | POA: Diagnosis not present

## 2019-11-29 DIAGNOSIS — Z0000 Encounter for general adult medical examination without abnormal findings: Secondary | ICD-10-CM | POA: Diagnosis not present

## 2019-11-29 DIAGNOSIS — E7800 Pure hypercholesterolemia, unspecified: Secondary | ICD-10-CM | POA: Diagnosis not present

## 2019-11-29 HISTORY — DX: Malignant neoplasm of prostate: C61

## 2019-11-29 NOTE — Progress Notes (Signed)
See progress note under your physician encounter.

## 2019-11-29 NOTE — Progress Notes (Signed)
Radiation Oncology         [phone] ________________________________  Initial outpatient Consultation - Conducted via Telephone due to current COVID-19 concerns for limiting patient exposure  Name: [name] MRN: [id number]  Date: 11/29/2019  DOB: 1946-08-26  [id number], [name], MD  Ardis Hughs, MD   REFERRING PHYSICIAN: Ardis Hughs, MD  DIAGNOSIS: 74 y.o. gentleman with Stage T1c adenocarcinoma of the prostate with Gleason score of 4+3, and PSA of 9.25.    ICD-10-CM   1. Malignant neoplasm of prostate ([place])  C61     HISTORY OF PRESENT ILLNESS: [name] is a 74 y.o. male with a diagnosis of prostate cancer. He was noted to have an elevated PSA of 6.98 by his primary care physician, Dr. Osborne Casco, in late 10/2018.  Accordingly, he was referred for evaluation in urology by Dr. Louis Meckel but postponed this consult visit until 08/02/2019 due to COVID-19.  At the time of consult, a digital rectal examination was performed, revealing no nodules.  A repeat PSA performed the following day was further elevated at 7.94 and even further elevated when repeated on 09/06/2019, at 9.25.  The patient requested to postpone his biopsy until after the holidays.  Accordingly, he proceeded to transrectal ultrasound with 12 biopsies of the prostate on 10/31/2019.  The prostate volume measured 17.14 cc.  Out of 12 core biopsies, 6 were positive.  The maximum Gleason score was 4+3, and this was seen in the left base (small focus), right mid, right apex, and right apex lateral. Additionally, Gleason 3+4 was seen in the right base lateral and right mid lateral.  Of note, he had a CT A/P on 12/16/2018 for left lower quadrant pain. This was negative for any prostate or seminal vesicle abnormalities, as well as negative for any lymphadenopathy or osseous abnormality. However, it did show evidence of diverticulitis without perforation or abscess; nonspecific mild rectal wall thickening; 2.2 cm exophytic  upper pole right kidney lesion with attenuation suggesting either a cyst complicated by proteinaceous debris or hemorrhage or a neoplasm.  The patient reviewed the biopsy results with his urologist and he has kindly been referred today for discussion of potential radiation treatment options. He has his daughter, who is a Marine scientist, on the phone with him today.   PREVIOUS RADIATION THERAPY: No  PAST MEDICAL HISTORY:  Past Medical History:  Diagnosis Date  . Arthritis   . CAD (coronary artery disease)   . Carotid artery occlusion   . Diabetes mellitus    fasting 100-120s  . Dizziness   . Dysrhythmia    skips beats  . GERD (gastroesophageal reflux disease)   . History of kidney stones   . Hyperlipidemia   . Hypertension   . IHD (ischemic heart disease)    Prior CABG in 2003  . Normal nuclear stress test June 2012  . Peripheral vascular disease ([place])   . Prostate cancer ([place])   . Torn rotator cuff       PAST SURGICAL HISTORY: Past Surgical History:  Procedure Laterality Date  . ABDOMINAL AORTAGRAM N/A 12/21/2013   Procedure: ABDOMINAL Maxcine Ham;  Surgeon: Elam Dutch, MD;  Location: [facility] CATH LAB;  Service: Cardiovascular;  Laterality: N/A;  . ABDOMINAL AORTOGRAM N/A 11/13/2017   Procedure: ABDOMINAL AORTOGRAM;  Surgeon: Elam Dutch, MD;  Location: [facility] CV LAB;  Service: Cardiovascular;  Laterality: N/A;  . APPENDECTOMY    . ARCH AORTOGRAM  08/26/2013   Procedure: ARCH AORTOGRAM;  Surgeon:  Elam Dutch, MD;  Location: [facility] CATH LAB;  Service: Cardiovascular;;  . CARDIAC CATHETERIZATION  05/03/2002   EF 40-45%  . CARDIOVASCULAR STRESS TEST  08/10/2006   EF 65%  . CAROTID ENDARTERECTOMY    . CAROTID STENT INSERTION Left 08/26/2013   Procedure: CAROTID STENT INSERTION;  Surgeon: Elam Dutch, MD;  Location: [facility] CATH LAB;  Service: Cardiovascular;  Laterality: Left;  internal carotid  . CORONARY ARTERY BYPASS GRAFT  04/2002  . DOPPLER ECHOCARDIOGRAPHY  08/12/2002    EF 80-85%  . ENDARTERECTOMY Left 07/20/2013   Procedure: ATTEMPTED ENDARTERECTOMY CAROTID;  Surgeon: Conrad [place], MD;  Location: [facility];  Service: Vascular;  Laterality: Left;  . HERNIA REPAIR    . LOWER EXTREMITY ANGIOGRAPHY  11/13/2017   Procedure: Lower Extremity Angiography;  Surgeon: Elam Dutch, MD;  Location: [facility] CV LAB;  Service: Cardiovascular;;  . PERIPHERAL VASCULAR INTERVENTION Right 11/13/2017   Procedure: PERIPHERAL VASCULAR INTERVENTION;  Surgeon: Elam Dutch, MD;  Location: [facility] CV LAB;  Service: Cardiovascular;  Laterality: Right;  superficial femoral  . SHOULDER SURGERY      FAMILY HISTORY:  Family History  Problem Relation Age of Onset  . Heart attack Mother   . Diabetes Mother   . Heart disease Mother   . Hypertension Mother   . Heart attack Father   . Heart disease Father   . Hypertension Father   . Diabetes Brother   . Diabetes Sister   . Diabetes Daughter   . Heart disease Daughter   . Hypertension Daughter   . Diabetes Sister   . Breast cancer Neg Hx   . Prostate cancer Neg Hx   . Colon cancer Neg Hx   . Pancreatic cancer Neg Hx     SOCIAL HISTORY:  Social History   Socioeconomic History  . Marital status: Married    Spouse name: Not on file  . Number of children: 2  . Years of education: Not on file  . Highest education level: Not on file  Occupational History  . Not on file  Tobacco Use  . Smoking status: Former Smoker    Packs/day: 1.00    Years: 20.00    Pack years: 20.00    Types: Cigarettes    Quit date: 10/27/2001    Years since quitting: 18.1  . Smokeless tobacco: Never Used  Substance and Sexual Activity  . Alcohol use: No    Alcohol/week: 0.0 standard drinks  . Drug use: No  . Sexual activity: Not Currently  Other Topics Concern  . Not on file  Social History Narrative  . Not on file   Social Determinants of Health   Financial Resource Strain:   . Difficulty of Paying Living Expenses: Not on  file  Food Insecurity:   . Worried About Charity fundraiser in the Last Year: Not on file  . Ran Out of Food in the Last Year: Not on file  Transportation Needs:   . Lack of Transportation (Medical): Not on file  . Lack of Transportation (Non-Medical): Not on file  Physical Activity:   . Days of Exercise per Week: Not on file  . Minutes of Exercise per Session: Not on file  Stress:   . Feeling of Stress : Not on file  Social Connections:   . Frequency of Communication with Friends and Family: Not on file  . Frequency of Social Gatherings with Friends and Family: Not on file  . Attends Religious  Services: Not on file  . Active Member of Clubs or Organizations: Not on file  . Attends Archivist Meetings: Not on file  . Marital Status: Not on file  Intimate Partner Violence:   . Fear of Current or Ex-Partner: Not on file  . Emotionally Abused: Not on file  . Physically Abused: Not on file  . Sexually Abused: Not on file    ALLERGIES: Lipitor [atorvastatin calcium], Sulfa drugs cross reactors, and Codeine  MEDICATIONS:  Current Outpatient Medications  Medication Sig Dispense Refill  . amLODipine-benazepril (LOTREL) 5-20 MG capsule Take 1 capsule by mouth daily. 90 capsule 3  . aspirin EC 81 MG tablet Take 81 mg by mouth daily.    . clopidogrel (PLAVIX) 75 MG tablet Take 1 tablet (75 mg total) by mouth daily. 90 tablet 3  . hydrochlorothiazide (HYDRODIURIL) 25 MG tablet Take 1 tablet (25 mg total) by mouth daily. 90 tablet 3  . insulin NPH Human (HUMULIN N) 100 UNIT/ML injection Inject 45 units [place] BID    . insulin regular (NOVOLIN R) 100 units/mL injection Inject up to 20 units [place] TID meals    . metFORMIN (GLUCOPHAGE-XR) 500 MG 24 hr tablet Take 500 mg by mouth 2 (two) times daily with a meal.     . metoprolol tartrate (LOPRESSOR) 50 MG tablet Take 1 tablet (50 mg total) by mouth 2 (two) times daily. 180 tablet 3  . omeprazole (PRILOSEC) 20 MG capsule Take 20 mg by mouth  daily.    . rosuvastatin (CRESTOR) 20 MG tablet Take 1 tablet (20 mg total) by mouth daily. 90 tablet 3  . nitroGLYCERIN (NITROSTAT) 0.4 MG SL tablet Place 1 tablet (0.4 mg total) under the tongue every 5 (five) minutes as needed for chest pain. (Patient not taking: Reported on 11/29/2019) 25 tablet 6   No current facility-administered medications for this encounter.    REVIEW OF SYSTEMS:  On review of systems, the patient reports that he is doing well overall. He denies any chest pain, shortness of breath, cough, fevers, chills, night sweats, unintended weight changes. He denies any bowel disturbances, and denies abdominal pain, nausea or vomiting. He denies any new musculoskeletal or joint aches or pains. His IPSS was 3, indicating mild urinary symptoms. His SHIM was 1, indicating he has severe erectile dysfunction. A complete review of systems is obtained and is otherwise negative.    PHYSICAL EXAM:  Wt Readings from Last 3 Encounters:  11/29/19 190 lb (86.2 kg)  08/30/19 190 lb (86.2 kg)  04/11/19 187 lb (84.8 kg)   Temp Readings from Last 3 Encounters:  07/08/18 98.2 F (36.8 C) (Oral)  04/01/18 97.6 F (36.4 C) (Oral)  11/13/17 98.7 F (37.1 C) (Oral)   BP Readings from Last 3 Encounters:  08/30/19 135/67  04/11/19 135/68  08/24/18 138/66   Pulse Readings from Last 3 Encounters:  08/30/19 63  04/11/19 65  08/24/18 61   Pain Assessment Pain Score: 0-No pain/10  Physical exam not performed in light of telephone consult visit format.   KPS = 90  100 - Normal; no complaints; no evidence of disease. 90   - Able to carry on normal activity; minor signs or symptoms of disease. 80   - Normal activity with effort; some signs or symptoms of disease. 38   - Cares for self; unable to carry on normal activity or to do active work. 60   - Requires occasional assistance, but is able to care for most  of his personal needs. 50   - Requires considerable assistance and frequent  medical care. 70   - Disabled; requires special care and assistance. 68   - Severely disabled; hospital admission is indicated although death not imminent. 57   - Very sick; hospital admission necessary; active supportive treatment necessary. 10   - Moribund; fatal processes progressing rapidly. 0     - Dead  Karnofsky DA, Abelmann [place], Craver LS and Burchenal [facility] [phone]) The use of the nitrogen mustards in the palliative treatment of carcinoma: with particular reference to bronchogenic carcinoma Cancer 1 634-56  LABORATORY DATA:  Lab Results  Component Value Date   WBC 8.3 08/27/2013   HGB 16.7 11/13/2017   HCT 49.0 11/13/2017   MCV 90.2 08/27/2013   PLT 152 08/27/2013   Lab Results  Component Value Date   NA 139 08/24/2018   K 4.1 08/24/2018   CL 99 08/24/2018   CO2 23 08/24/2018   Lab Results  Component Value Date   ALT 17 08/24/2018   AST 18 08/24/2018   ALKPHOS 89 08/24/2018   BILITOT 0.4 08/24/2018     RADIOGRAPHY: No results found.    IMPRESSION/PLAN: This visit was conducted via Telephone to spare the patient unnecessary potential exposure in the healthcare setting during the current COVID-19 pandemic. 1. 74 y.o. gentleman with Stage T1c adenocarcinoma of the prostate with Gleason Score of 4+3, and PSA of 9.25. We discussed the patient's workup and outlined the nature of prostate cancer in this setting. The patient's T stage, Gleason's score, and PSA put him into the unfavorable intermediate risk group. Accordingly, he is eligible for a variety of potential treatment options including brachytherapy, 5.5 weeks of external radiation or prostatectomy. We discussed the available radiation techniques, and focused on the details and logistics of delivery. The patient is not an ideal candidate for brachytherapy with a prostate volume of 17 cc. Therefore, we discussed and outlined the risks, benefits, short and long-term effects associated with daily external beam radiotherapy and  compared and contrasted these with prostatectomy. We discussed the role of SpaceOAR gel in reducing the rectal toxicity associated with radiotherapy. We also detailed the role of ADT in the treatment of unfavorable intermediate risk prostate cancer and outlined the associated side effects that could be expected with this therapy.  He and his daughter were encouraged to ask questions that were answered to their stated satisfaction.  At the end of the conversation, the patient is interested in moving forward with 5.5 weeks of external beam therapy in combination with ST-ADT. He has not received his first Lupron injection. We will share our discussion with Dr. Louis Meckel and make arrangements for a follow-up visit, first available, to start ADT now and will also make arrangements for fiducial markers and SpaceOAR gel placement prior to CT simulation in late March or early April, to reduce rectal toxicity from radiotherapy. He will be scheduled for simulation for treatment planning shortly thereafter, in anticipation of beginning IMRT approximately 8 weeks from the start of ADT.  He appears to have a good understanding of his disease and our recommendations for treatment which are of curative intent.  He is comfortable and in agreement with the stated plan so we will move forward with treatment planning accordingly.  Given current concerns for patient exposure during the COVID-19 pandemic, this encounter was conducted via telephone. The patient was notified in advance and was offered a MyChart meeting to allow for face to face communication but unfortunately  reported that he did not have the appropriate resources/technology to support such a visit and instead preferred to proceed with telephone consult. The patient has given verbal consent for this type of encounter. The time spent during this encounter was 50 minutes. The attendants for this meeting include Tyler Pita MD, Ashlyn Bruning PA-C, Katie  [place], patient, DALON BLATTEL and his daughter. During the encounter, Tyler Pita MD, Ashlyn Bruning PA-C, and scribe, Wilburn Mylar were located at [place].  Patient, [name] and his daughter were located at home.    Nicholos Johns, PA-C    Tyler Pita, MD  [facility] Direct Dial: [phone]  Fax: [phone] [email or web address]  Skype  [email or web address]  This document serves as a record of services personally performed by Tyler Pita, MD and Freeman Caldron, PA-C. It was created on their behalf by Wilburn Mylar, a trained medical scribe. The creation of this record is based on the scribe's personal observations and the provider's statements to them. This document has been checked and approved by the attending provider.

## 2019-11-30 ENCOUNTER — Other Ambulatory Visit: Payer: Self-pay | Admitting: Urology

## 2019-11-30 ENCOUNTER — Encounter: Payer: Self-pay | Admitting: Urology

## 2019-11-30 ENCOUNTER — Telehealth: Payer: Self-pay | Admitting: *Deleted

## 2019-11-30 DIAGNOSIS — C61 Malignant neoplasm of prostate: Secondary | ICD-10-CM

## 2019-11-30 NOTE — Telephone Encounter (Signed)
CALLED PATIENT TO INFORM OF ADT APPT. FOR 12-06-19 - ARRIVAL TIME- 1:30 PM @ DR. HERRICK'S OFFICE, SPOKE WITH PATIENT AND HE IS AWARE OF THIS APPT.

## 2019-12-01 ENCOUNTER — Ambulatory Visit: Payer: Medicare HMO | Attending: Internal Medicine

## 2019-12-01 DIAGNOSIS — Z23 Encounter for immunization: Secondary | ICD-10-CM

## 2019-12-01 NOTE — Progress Notes (Signed)
   Covid-19 Vaccination Clinic  Name:  [name]    MRN: [id number] DOB: [birth year]  12/01/2019  [name] was observed post Covid-19 immunization for  15 minutes without incidence. He was provided with Vaccine Information Sheet and instruction to access the V-Safe system.   [name] was instructed to call 911 with any severe reactions post vaccine: Marland Kitchen Difficulty breathing  . Swelling of your face and throat  . A fast heartbeat  . A bad rash all over your body  . Dizziness and weakness

## 2019-12-03 ENCOUNTER — Ambulatory Visit: Payer: Medicare HMO

## 2019-12-05 ENCOUNTER — Telehealth: Payer: Self-pay | Admitting: Medical Oncology

## 2019-12-05 NOTE — Telephone Encounter (Signed)
Spoke with patient to introduce myself as the prostate nurse navigator and discuss my role. I was unable to meet him 2/02, when he consulted with Dr. Tammi Klippel. He states the consult went well and has chosen ST-ADT with 5 1/2 weeks of radiation. He is scheduled for  ADT 2/09 @ 1:30 pm at [facility]. He confirmed this appointment. I gave him my contact information and asked him to call me with questions or  concerns. He voiced understanding.

## 2019-12-06 DIAGNOSIS — E1151 Type 2 diabetes mellitus with diabetic peripheral angiopathy without gangrene: Secondary | ICD-10-CM | POA: Diagnosis not present

## 2019-12-06 DIAGNOSIS — H9193 Unspecified hearing loss, bilateral: Secondary | ICD-10-CM | POA: Diagnosis not present

## 2019-12-06 DIAGNOSIS — Z1331 Encounter for screening for depression: Secondary | ICD-10-CM | POA: Diagnosis not present

## 2019-12-06 DIAGNOSIS — I25810 Atherosclerosis of coronary artery bypass graft(s) without angina pectoris: Secondary | ICD-10-CM | POA: Diagnosis not present

## 2019-12-06 DIAGNOSIS — K2270 Barrett's esophagus without dysplasia: Secondary | ICD-10-CM | POA: Diagnosis not present

## 2019-12-06 DIAGNOSIS — E7800 Pure hypercholesterolemia, unspecified: Secondary | ICD-10-CM | POA: Diagnosis not present

## 2019-12-06 DIAGNOSIS — I119 Hypertensive heart disease without heart failure: Secondary | ICD-10-CM | POA: Diagnosis not present

## 2019-12-06 DIAGNOSIS — Z0000 Encounter for general adult medical examination without abnormal findings: Secondary | ICD-10-CM | POA: Diagnosis not present

## 2019-12-06 DIAGNOSIS — I6529 Occlusion and stenosis of unspecified carotid artery: Secondary | ICD-10-CM | POA: Diagnosis not present

## 2019-12-06 DIAGNOSIS — C61 Malignant neoplasm of prostate: Secondary | ICD-10-CM | POA: Diagnosis not present

## 2019-12-12 ENCOUNTER — Ambulatory Visit: Payer: Medicare HMO

## 2019-12-12 DIAGNOSIS — Z5111 Encounter for antineoplastic chemotherapy: Secondary | ICD-10-CM | POA: Diagnosis not present

## 2019-12-12 DIAGNOSIS — C61 Malignant neoplasm of prostate: Secondary | ICD-10-CM | POA: Diagnosis not present

## 2019-12-13 ENCOUNTER — Encounter: Payer: Self-pay | Admitting: Medical Oncology

## 2019-12-21 DIAGNOSIS — I10 Essential (primary) hypertension: Secondary | ICD-10-CM | POA: Diagnosis not present

## 2019-12-21 DIAGNOSIS — Z794 Long term (current) use of insulin: Secondary | ICD-10-CM | POA: Diagnosis not present

## 2019-12-21 DIAGNOSIS — E785 Hyperlipidemia, unspecified: Secondary | ICD-10-CM | POA: Diagnosis not present

## 2019-12-21 DIAGNOSIS — E1140 Type 2 diabetes mellitus with diabetic neuropathy, unspecified: Secondary | ICD-10-CM | POA: Diagnosis not present

## 2019-12-26 ENCOUNTER — Ambulatory Visit: Payer: Medicare HMO | Attending: Internal Medicine

## 2019-12-26 DIAGNOSIS — Z23 Encounter for immunization: Secondary | ICD-10-CM

## 2019-12-26 NOTE — Progress Notes (Signed)
   Covid-19 Vaccination Clinic  Name:  KASHAUN SITZER    MRN: [id number] DOB: 05-01-1946  12/26/2019  Mr. Hitchins was observed post Covid-19 immunization for 15 minutes without incidence. He was provided with Vaccine Information Sheet and instruction to access the V-Safe system.   Mr. Batten was instructed to call 911 with any severe reactions post vaccine: Marland Kitchen Difficulty breathing  . Swelling of your face and throat  . A fast heartbeat  . A bad rash all over your body  . Dizziness and weakness    Immunizations Administered    Name Date Dose VIS Date Route   Pfizer COVID-19 Vaccine 12/26/2019  3:40 PM 0.3 mL 10/07/2019 Intramuscular   Manufacturer: [place]   Lot: [id number]   [place]: [id number]

## 2020-01-25 ENCOUNTER — Telehealth: Payer: Self-pay | Admitting: *Deleted

## 2020-01-25 NOTE — Telephone Encounter (Signed)
CALLED PATIENT TO INFORM OF CT SIM BEING MOVED TO 02-07-20 - ARRIVAL TIME- 7:45 AM @ [facility] TO BE ON 02-07-20- ARRIVAL TIME- 9:30 AM @ [facility], SPOKE WITH PATIENT AND HE IS AWARE OF THESE APPTS.

## 2020-01-31 DIAGNOSIS — C61 Malignant neoplasm of prostate: Secondary | ICD-10-CM | POA: Diagnosis not present

## 2020-02-01 ENCOUNTER — Other Ambulatory Visit: Payer: Self-pay | Admitting: Urology

## 2020-02-01 ENCOUNTER — Telehealth: Payer: Self-pay | Admitting: Radiation Oncology

## 2020-02-01 MED ORDER — LORAZEPAM 1 MG PO TABS: 1.0000 mg | ORAL_TABLET | ORAL | 0 refills | Status: DC | PRN

## 2020-02-01 NOTE — Telephone Encounter (Signed)
-----   Message from Freeman Caldron, [place] sent at 02/01/2020 12:53 PM EDT ----- Regarding: RE: Claustrophobic Done.  Please let them know that they can pick up the medication at any time prior to his MRI. Thank you! -Ashlyn ----- Message ----- From: Heywood Footman, RN Sent: 02/01/2020  12:25 PM EDT To: Freeman Caldron, PA-C Subject: Claustrophobic                                  Ashlyn.   I received a call from this patient's daughter requesting medication to relieve her father's claustrophobia in preparation for his limit pelvic MRI on 02/07/20 at 10. The preferred pharmacy is [facility].   [name]      74 y.o. gentleman with Stage T1c adenocarcinoma of the prostate with Gleason Score of 4+3, and PSA of 9.25.The patient is interested in moving forward with 5.5 weeks of external beam therapy in combination with ST-ADT. He has not received his first Lupron injection.

## 2020-02-01 NOTE — Telephone Encounter (Signed)
Phoned Ann. Explained Ativan 1 mg has been escribed to [facility], [place]. Reviewed instructions for taking medication with Lelon Frohlich. Explained she would need to drive her father to and from the appointment as this medication could cause drowsiness. She verbalized understanding of all reviewed and appreciation for the assistance.

## 2020-02-01 NOTE — Telephone Encounter (Signed)
Received call from patient's daughter, Lelon Frohlich, requesting medication to relieve her father's claustrophobia in preparation for his limit pelvic MRI on Tuesday, 02/07/20 at 1000. Phoned Ann back. Explained I have received her message and forwarded her request to [facility]. Explained once the script has been sent to [facility] I will phone her back. She verbalized understanding and appreciation for the return call.

## 2020-02-03 ENCOUNTER — Ambulatory Visit: Payer: Medicare HMO | Admitting: Radiation Oncology

## 2020-02-06 ENCOUNTER — Telehealth: Payer: Self-pay | Admitting: *Deleted

## 2020-02-06 NOTE — Telephone Encounter (Signed)
Called patient to remind of appts.for 02-07-20, spoke with patient and he is aware of these appts.

## 2020-02-07 ENCOUNTER — Other Ambulatory Visit: Payer: Self-pay

## 2020-02-07 ENCOUNTER — Ambulatory Visit
Admission: RE | Admit: 2020-02-07 | Discharge: 2020-02-07 | Disposition: A | Discharge status: Home / Self Care | Payer: Medicare HMO | Source: Ambulatory Visit | Attending: Radiation Oncology | Admitting: Radiation Oncology

## 2020-02-07 ENCOUNTER — Ambulatory Visit (HOSPITAL_COMMUNITY)
Admission: RE | Admit: 2020-02-07 | Discharge: 2020-02-07 | Disposition: A | Discharge status: Home / Self Care | Payer: Medicare HMO | Source: Ambulatory Visit | Attending: Urology | Admitting: Urology

## 2020-02-07 DIAGNOSIS — Z510 Encounter for antineoplastic radiation therapy: Secondary | ICD-10-CM | POA: Diagnosis not present

## 2020-02-07 DIAGNOSIS — C61 Malignant neoplasm of prostate: Secondary | ICD-10-CM | POA: Insufficient documentation

## 2020-02-07 NOTE — Progress Notes (Signed)
  Radiation Oncology         [phone]) [phone] ________________________________  Name: [name] MRN: [id number]  Date: 02/07/2020  DOB: June 26, 1946  SIMULATION AND TREATMENT PLANNING NOTE    ICD-10-CM   1. Malignant neoplasm of prostate ([place])  C61     DIAGNOSIS:  74 y.o. gentleman with Stage T1c adenocarcinoma of the prostate with Gleason score of 4+3, and PSA of 9.25.  NARRATIVE:  The patient was brought to the [facility].  Identity was confirmed.  All relevant records and images related to the planned course of therapy were reviewed.  The patient freely provided informed written consent to proceed with treatment after reviewing the details related to the planned course of therapy. The consent form was witnessed and verified by the simulation staff.  Then, the patient was set-up in a stable reproducible supine position for radiation therapy.  A vacuum lock pillow device was custom fabricated to position his legs in a reproducible immobilized position.  Then, I performed a urethrogram under sterile conditions to identify the prostatic apex.  CT images were obtained.  Surface markings were placed.  The CT images were loaded into the planning software.  Then the prostate target and avoidance structures including the rectum, bladder, bowel and hips were contoured.  Treatment planning then occurred.  The radiation prescription was entered and confirmed.  A total of one complex treatment devices was fabricated. I have requested : Intensity Modulated Radiotherapy (IMRT) is medically necessary for this case for the following reason:  Rectal sparing.Marland Kitchen  PLAN:  The patient will receive 70 Gy in 28 fractions.  ________________________________  Sheral Apley Tammi Klippel, M.D.

## 2020-02-08 DIAGNOSIS — C61 Malignant neoplasm of prostate: Secondary | ICD-10-CM | POA: Diagnosis not present

## 2020-02-08 DIAGNOSIS — Z510 Encounter for antineoplastic radiation therapy: Secondary | ICD-10-CM | POA: Diagnosis not present

## 2020-02-16 ENCOUNTER — Ambulatory Visit
Admission: RE | Admit: 2020-02-16 | Discharge: 2020-02-16 | Disposition: A | Discharge status: Home / Self Care | Payer: Medicare HMO | Source: Ambulatory Visit | Attending: Radiation Oncology | Admitting: Radiation Oncology

## 2020-02-16 ENCOUNTER — Other Ambulatory Visit: Payer: Self-pay

## 2020-02-16 ENCOUNTER — Encounter: Payer: Self-pay | Admitting: Medical Oncology

## 2020-02-16 DIAGNOSIS — C61 Malignant neoplasm of prostate: Secondary | ICD-10-CM | POA: Diagnosis not present

## 2020-02-16 DIAGNOSIS — Z510 Encounter for antineoplastic radiation therapy: Secondary | ICD-10-CM | POA: Diagnosis not present

## 2020-02-17 ENCOUNTER — Ambulatory Visit
Admission: RE | Admit: 2020-02-17 | Discharge: 2020-02-17 | Disposition: A | Discharge status: Home / Self Care | Payer: Medicare HMO | Source: Ambulatory Visit | Attending: Radiation Oncology | Admitting: Radiation Oncology

## 2020-02-17 ENCOUNTER — Other Ambulatory Visit: Payer: Self-pay

## 2020-02-17 DIAGNOSIS — Z510 Encounter for antineoplastic radiation therapy: Secondary | ICD-10-CM | POA: Diagnosis not present

## 2020-02-17 DIAGNOSIS — C61 Malignant neoplasm of prostate: Secondary | ICD-10-CM | POA: Diagnosis not present

## 2020-02-20 ENCOUNTER — Ambulatory Visit
Admission: RE | Admit: 2020-02-20 | Discharge: 2020-02-20 | Disposition: A | Discharge status: Home / Self Care | Payer: Medicare HMO | Source: Ambulatory Visit | Attending: Radiation Oncology | Admitting: Radiation Oncology

## 2020-02-20 ENCOUNTER — Other Ambulatory Visit: Payer: Self-pay

## 2020-02-20 DIAGNOSIS — Z510 Encounter for antineoplastic radiation therapy: Secondary | ICD-10-CM | POA: Diagnosis not present

## 2020-02-20 DIAGNOSIS — C61 Malignant neoplasm of prostate: Secondary | ICD-10-CM | POA: Diagnosis not present

## 2020-02-21 ENCOUNTER — Other Ambulatory Visit: Payer: Self-pay

## 2020-02-21 ENCOUNTER — Ambulatory Visit
Admission: RE | Admit: 2020-02-21 | Discharge: 2020-02-21 | Disposition: A | Discharge status: Home / Self Care | Payer: Medicare HMO | Source: Ambulatory Visit | Attending: Radiation Oncology | Admitting: Radiation Oncology

## 2020-02-21 DIAGNOSIS — C61 Malignant neoplasm of prostate: Secondary | ICD-10-CM | POA: Diagnosis not present

## 2020-02-21 DIAGNOSIS — Z510 Encounter for antineoplastic radiation therapy: Secondary | ICD-10-CM | POA: Diagnosis not present

## 2020-02-22 ENCOUNTER — Other Ambulatory Visit: Payer: Self-pay

## 2020-02-22 ENCOUNTER — Ambulatory Visit
Admission: RE | Admit: 2020-02-22 | Discharge: 2020-02-22 | Disposition: A | Discharge status: Home / Self Care | Payer: Medicare HMO | Source: Ambulatory Visit | Attending: Radiation Oncology | Admitting: Radiation Oncology

## 2020-02-22 DIAGNOSIS — Z510 Encounter for antineoplastic radiation therapy: Secondary | ICD-10-CM | POA: Diagnosis not present

## 2020-02-22 DIAGNOSIS — C61 Malignant neoplasm of prostate: Secondary | ICD-10-CM | POA: Diagnosis not present

## 2020-02-23 ENCOUNTER — Other Ambulatory Visit: Payer: Self-pay

## 2020-02-23 ENCOUNTER — Ambulatory Visit
Admission: RE | Admit: 2020-02-23 | Discharge: 2020-02-23 | Disposition: A | Discharge status: Home / Self Care | Payer: Medicare HMO | Source: Ambulatory Visit | Attending: Radiation Oncology | Admitting: Radiation Oncology

## 2020-02-23 DIAGNOSIS — C61 Malignant neoplasm of prostate: Secondary | ICD-10-CM | POA: Diagnosis not present

## 2020-02-23 DIAGNOSIS — Z510 Encounter for antineoplastic radiation therapy: Secondary | ICD-10-CM | POA: Diagnosis not present

## 2020-02-24 ENCOUNTER — Ambulatory Visit
Admission: RE | Admit: 2020-02-24 | Discharge: 2020-02-24 | Disposition: A | Discharge status: Home / Self Care | Payer: Medicare HMO | Source: Ambulatory Visit | Attending: Radiation Oncology | Admitting: Radiation Oncology

## 2020-02-24 ENCOUNTER — Other Ambulatory Visit: Payer: Self-pay

## 2020-02-24 DIAGNOSIS — C61 Malignant neoplasm of prostate: Secondary | ICD-10-CM | POA: Diagnosis not present

## 2020-02-24 DIAGNOSIS — Z510 Encounter for antineoplastic radiation therapy: Secondary | ICD-10-CM | POA: Diagnosis not present

## 2020-02-27 ENCOUNTER — Other Ambulatory Visit: Payer: Self-pay

## 2020-02-27 ENCOUNTER — Ambulatory Visit
Admission: RE | Admit: 2020-02-27 | Discharge: 2020-02-27 | Disposition: A | Discharge status: Home / Self Care | Payer: Medicare HMO | Source: Ambulatory Visit | Attending: Radiation Oncology | Admitting: Radiation Oncology

## 2020-02-27 DIAGNOSIS — C61 Malignant neoplasm of prostate: Secondary | ICD-10-CM | POA: Diagnosis not present

## 2020-02-27 DIAGNOSIS — Z510 Encounter for antineoplastic radiation therapy: Secondary | ICD-10-CM | POA: Diagnosis not present

## 2020-02-28 ENCOUNTER — Ambulatory Visit
Admission: RE | Admit: 2020-02-28 | Discharge: 2020-02-28 | Disposition: A | Discharge status: Home / Self Care | Payer: Medicare HMO | Source: Ambulatory Visit | Attending: Radiation Oncology | Admitting: Radiation Oncology

## 2020-02-28 ENCOUNTER — Other Ambulatory Visit: Payer: Self-pay

## 2020-02-28 DIAGNOSIS — Z510 Encounter for antineoplastic radiation therapy: Secondary | ICD-10-CM | POA: Diagnosis not present

## 2020-02-28 DIAGNOSIS — C61 Malignant neoplasm of prostate: Secondary | ICD-10-CM | POA: Diagnosis not present

## 2020-02-29 ENCOUNTER — Ambulatory Visit
Admission: RE | Admit: 2020-02-29 | Discharge: 2020-02-29 | Disposition: A | Discharge status: Home / Self Care | Payer: Medicare HMO | Source: Ambulatory Visit | Attending: Radiation Oncology | Admitting: Radiation Oncology

## 2020-02-29 ENCOUNTER — Other Ambulatory Visit: Payer: Self-pay

## 2020-02-29 DIAGNOSIS — Z510 Encounter for antineoplastic radiation therapy: Secondary | ICD-10-CM | POA: Diagnosis not present

## 2020-02-29 DIAGNOSIS — C61 Malignant neoplasm of prostate: Secondary | ICD-10-CM | POA: Diagnosis not present

## 2020-03-01 ENCOUNTER — Ambulatory Visit: Payer: Medicare HMO | Admitting: Physician Assistant

## 2020-03-01 ENCOUNTER — Encounter: Payer: Self-pay | Admitting: Physician Assistant

## 2020-03-01 ENCOUNTER — Ambulatory Visit
Admission: RE | Admit: 2020-03-01 | Discharge: 2020-03-01 | Disposition: A | Discharge status: Home / Self Care | Payer: Medicare HMO | Source: Ambulatory Visit | Attending: Radiation Oncology | Admitting: Radiation Oncology

## 2020-03-01 ENCOUNTER — Other Ambulatory Visit: Payer: Self-pay

## 2020-03-01 VITALS — BP 128/58 | HR 64 | Ht 71.0 in | Wt 184.6 lb

## 2020-03-01 DIAGNOSIS — I10 Essential (primary) hypertension: Secondary | ICD-10-CM

## 2020-03-01 DIAGNOSIS — I739 Peripheral vascular disease, unspecified: Secondary | ICD-10-CM | POA: Diagnosis not present

## 2020-03-01 DIAGNOSIS — I2510 Atherosclerotic heart disease of native coronary artery without angina pectoris: Secondary | ICD-10-CM

## 2020-03-01 DIAGNOSIS — E7849 Other hyperlipidemia: Secondary | ICD-10-CM

## 2020-03-01 DIAGNOSIS — I6523 Occlusion and stenosis of bilateral carotid arteries: Secondary | ICD-10-CM | POA: Diagnosis not present

## 2020-03-01 DIAGNOSIS — Z510 Encounter for antineoplastic radiation therapy: Secondary | ICD-10-CM | POA: Diagnosis not present

## 2020-03-01 DIAGNOSIS — C61 Malignant neoplasm of prostate: Secondary | ICD-10-CM | POA: Diagnosis not present

## 2020-03-01 NOTE — Patient Instructions (Signed)

## 2020-03-01 NOTE — Progress Notes (Signed)
Cardiology Office Note    Date:  03/01/2020   ID:  [name], DOB Nov 19, 1945, MRN [id number]  PCP:  Haywood Pao, MD  Cardiologist: Dr. Acie Fredrickson  Chief Complaint: 6 Months follow up  History of Present Illness:   [name] is a 74 y.o. male with hx of CAD s/p CABG in 2003, carotid artery disease s/p right and left ECA, peripheral arterial disease s/p right leg stenting, diabetes mellitus, hypertension, prostate cancer with undergoing radiation therapy and hyperlipidemia presents for follow-up.  He was last seen by Wallis Bamberg, NP 08/2019. Worsening ABIs but defer further evaluation/Interventions due to pandemic.   Here today for follow-up.  Recently diagnosed with prostate cancer.  Followed by Dr. Tammi Klippel.  Undergoing radiation therapy, completed 11th of 28 therapy today.  He denies chest pain, shortness of breath, orthopnea, syncope, lower extremity edema or melena.  He restarted working at [employer] for part time.  He has left calf pain after walking for 15 minutes.  Stable.   Past Medical History:  Diagnosis Date  . Arthritis   . CAD (coronary artery disease)   . Carotid artery occlusion   . Diabetes mellitus    fasting 100-120s  . Dizziness   . Dysrhythmia    skips beats  . GERD (gastroesophageal reflux disease)   . History of kidney stones   . Hyperlipidemia   . Hypertension   . IHD (ischemic heart disease)    Prior CABG in 2003  . Normal nuclear stress test June 2012  . Peripheral vascular disease ([place])   . Prostate cancer ([place])   . Torn rotator cuff     Past Surgical History:  Procedure Laterality Date  . ABDOMINAL AORTAGRAM N/A 12/21/2013   Procedure: ABDOMINAL Maxcine Ham;  Surgeon: Elam Dutch, MD;  Location: [facility] CATH LAB;  Service: Cardiovascular;  Laterality: N/A;  . ABDOMINAL AORTOGRAM N/A 11/13/2017   Procedure: ABDOMINAL AORTOGRAM;  Surgeon: Elam Dutch, MD;  Location: [place] CV LAB;  Service:  Cardiovascular;  Laterality: N/A;  . APPENDECTOMY    . ARCH AORTOGRAM  08/26/2013   Procedure: ARCH AORTOGRAM;  Surgeon: Elam Dutch, MD;  Location: [facility] CATH LAB;  Service: Cardiovascular;;  . CARDIAC CATHETERIZATION  05/03/2002   EF 40-45%  . CARDIOVASCULAR STRESS TEST  08/10/2006   EF 65%  . CAROTID ENDARTERECTOMY    . CAROTID STENT INSERTION Left 08/26/2013   Procedure: CAROTID STENT INSERTION;  Surgeon: Elam Dutch, MD;  Location: [facility] C[facility] LAB;  Service: Cardiovascular;  Laterality: Left;  internal carotid  . CORONARY ARTERY BYPASS GRAFT  04/2002  . DOPPLER ECHOCARDIOGRAPHY  08/12/2002   EF 80-85%  . ENDARTERECTOMY Left 07/20/2013   Procedure: ATTEMPTED ENDARTERECTOMY CAROTID;  Surgeon: Conrad Bellport, MD;  Location: [place];  Service: Vascular;  Laterality: Left;  . HERNIA REPAIR    . LOWER EXTREMITY ANGIOGRAPHY  11/13/2017   Procedure: Lower Extremity Angiography;  Surgeon: Elam Dutch, MD;  Location: [place] CV LAB;  Service: Cardiovascular;;  . PERIPHERAL VASCULAR INTERVENTION Right 11/13/2017   Procedure: PERIPHERAL VASCULAR INTERVENTION;  Surgeon: Elam Dutch, MD;  Location: [place] CV LAB;  Service: Cardiovascular;  Laterality: Right;  superficial femoral  . SHOULDER SURGERY      Current Medications: Prior to Admission medications   Medication Sig Start Date End Date Taking? Authorizing Provider  amLODipine-benazepril (LOTREL) 5-20 MG capsule Take 1 capsule by mouth daily. 08/30/19   Daune Perch, NP  aspirin EC 81 MG tablet Take 81 mg by mouth daily.    [provider]  clopidogrel (PLAVIX) 75 MG tablet Take 1 tablet (75 mg total) by mouth daily. 02/11/19   Elam Dutch, MD  hydrochlorothiazide (HYDRODIURIL) 25 MG tablet Take 1 tablet (25 mg total) by mouth daily. 08/30/19   Daune Perch, NP  insulin NPH Human (HUMULIN N) 100 UNIT/ML injection Inject 45 units [place] BID 09/08/19   [provider]  insulin regular (NOVOLIN R) 100  units/mL injection Inject up to 20 units [place] TID meals 09/08/19   [provider]  LORazepam (ATIVAN) 1 MG tablet Take 1 tablet (1 mg total) by mouth as needed for anxiety (take one tablet 30 minutes prior to MRI and may repeat once if needed). 02/01/20   Bruning, Ashlyn, PA-C  metFORMIN (GLUCOPHAGE-XR) 500 MG 24 hr tablet Take 500 mg by mouth 2 (two) times daily with a meal.  06/16/13   [provider]  metoprolol tartrate (LOPRESSOR) 50 MG tablet Take 1 tablet (50 mg total) by mouth 2 (two) times daily. 08/30/19   Daune Perch, NP  nitroGLYCERIN (NITROSTAT) 0.4 MG SL tablet Place 1 tablet (0.4 mg total) under the tongue every 5 (five) minutes as needed for chest pain. Patient not taking: Reported on 11/29/2019 02/23/18   Nahser, Wonda Cheng, MD  omeprazole (PRILOSEC) 20 MG capsule Take 20 mg by mouth daily.    [provider]  rosuvastatin (CRESTOR) 20 MG tablet Take 1 tablet (20 mg total) by mouth daily. 08/30/19   Daune Perch, NP    Allergies:   Lipitor [atorvastatin calcium], Sulfa drugs cross reactors, and Codeine   Social History   Socioeconomic History  . Marital status: Married    Spouse name: Not on file  . Number of children: 2  . Years of education: Not on file  . Highest education level: Not on file  Occupational History  . Not on file  Tobacco Use  . Smoking status: Former Smoker    Packs/day: 1.00    Years: 20.00    Pack years: 20.00    Types: Cigarettes    Quit date: 10/27/2001    Years since quitting: 18.3  . Smokeless tobacco: Never Used  Substance and Sexual Activity  . Alcohol use: No    Alcohol/week: 0.0 standard drinks  . Drug use: No  . Sexual activity: Not Currently  Other Topics Concern  . Not on file  Social History Narrative  . Not on file   Social Determinants of Health   Financial Resource Strain:   . Difficulty of Paying Living Expenses:   Food Insecurity:   . Worried About Charity fundraiser in the Last Year:   . Arts development officer in the Last Year:   Transportation Needs:   . Film/video editor (Medical):   Connor Kitchen Lack of Transportation (Non-Medical):   Physical Activity:   . Days of Exercise per Week:   . Minutes of Exercise per Session:   Stress:   . Feeling of Stress :   Social Connections:   . Frequency of Communication with Friends and Family:   . Frequency of Social Gatherings with Friends and Family:   . Attends Religious Services:   . Active Member of Clubs or Organizations:   . Attends Archivist Meetings:   Connor Kitchen Marital Status:      Family History:  The patient's family history includes Diabetes in his brother, daughter, mother, sister,  and sister; Heart attack in his father and mother; Heart disease in his daughter, father, and mother; Hypertension in his daughter, father, and mother.   ROS:   Please see the history of present illness.    ROS All other systems reviewed and are negative.   PHYSICAL EXAM:   VS:  BP (!) 128/58   Pulse 64   Ht 5\' 11"  (1.803 m)   Wt 184 lb 9.6 oz (83.7 kg)   BMI 25.75 kg/m    GEN: Well nourished, well developed, in no acute distress  HEENT: normal  Neck: no JVD, carotid bruits, or masses Cardiac: RRR; no murmurs, rubs, or gallops,no edema  Respiratory:  clear to auscultation bilaterally, normal work of breathing GI: soft, nontender, nondistended, + BS MS: no deformity or atrophy  Skin: warm and dry, no rash Neuro:  Alert and Oriented x 3, Strength and sensation are intact Psych: euthymic mood, full affect  Wt Readings from Last 3 Encounters:  03/01/20 184 lb 9.6 oz (83.7 kg)  11/29/19 190 lb (86.2 kg)  08/30/19 190 lb (86.2 kg)      Studies/Labs Reviewed:   EKG:  EKG is ordered today.  The ekg ordered today demonstrates normal sinus rhythm at rate of 62 bpm, LAFB  Recent Labs: No results found for requested labs within last 8760 hours.   Lipid Panel    Component Value Date/Time   CHOL 132 08/24/2018 0825   TRIG 149 08/24/2018  0825   HDL 34 (L) 08/24/2018 0825   CHOLHDL 3.9 08/24/2018 0825   CHOLHDL 3.2 06/13/2016 0918   VLDL 21 06/13/2016 0918   LDLCALC 68 08/24/2018 0825    Additional studies/ records that were reviewed today include:   Carotid doppler 03/2019 Summary:  Right Carotid: Velocities in the right ICA are consistent with a 1-39%  stenosis.   Left Carotid: Velocities in the left ICA are consistent with a 40-59%  stenosis.        In stent velocities suggest 50-75% in stent restenosis.   Vertebrals: Bilateral vertebral arteries demonstrate antegrade flow.  Subclavians: Normal flow hemodynamics were seen in bilateral subclavian        arteries.   ABIs 03/2019 Summary:  Right: Resting right ankle-brachial index indicates moderate right lower  extremity arterial disease. The right toe-brachial index is abnormal. RT  great toe pressure = 34 mmHg.   Left: Resting left ankle-brachial index indicates moderate left lower  extremity arterial disease. The left toe-brachial index is abnormal. LT  Great toe pressure = 16 mmHg.    Arterial doppler 03/2019 Summary:  Right: 50-74% stenosis noted in the deep femoral artery. Stenosis is noted  within the proximal superficial femoral artery stent.   ASSESSMENT & PLAN:    1. CAD No anginal symptoms.  Continue aspirin, Plavix, beta-blocker and statin.  2.  Hyperlipidemia -LDL 64.  Continue Crestor 20 mg daily.  Followed by PCP.  3.  Peripheral arterial disease/carotid artery disease -Followed by vascular.  He says he will make appointment with Dr. Oneida Alar in couple of weeks.  He is not planning for any study until completes his radiation therapy. -Stable left claudication symptoms.  4.  Hypertension -Blood pressure stable on current medications  5.  Prostate cancer -Undergoing radiation therapy   Medication Adjustments/Labs and Tests Ordered: Current medicines are reviewed at length with the patient today.  Concerns regarding  medicines are outlined above.  Medication changes, Labs and Tests ordered today are listed in the Patient Instructions below.  Patient Instructions  Medication Instructions:  Your physician recommends that you continue on your current medications as directed. Please refer to the Current Medication list given to you today.  *If you need a refill on your cardiac medications before your next appointment, please call your pharmacy*   Lab Work: None ordered  If you have labs (blood work) drawn today and your tests are completely normal, you will receive your results only by: Connor Kitchen MyChart Message (if you have MyChart) OR . A paper copy in the mail If you have any lab test that is abnormal or we need to change your treatment, we will call you to review the results.   Testing/Procedures: None ordered   Follow-Up: At [facility], you and your health needs are our priority.  As part of our continuing mission to provide you with exceptional heart care, we have created designated Provider Care Teams.  These Care Teams include your primary Cardiologist (physician) and Advanced Practice Providers (APPs -  Physician Assistants and Nurse Practitioners) who all work together to provide you with the care you need, when you need it.  We recommend signing up for the patient portal called "MyChart".  Sign up information is provided on this After Visit Summary.  MyChart is used to connect with patients for Virtual Visits (Telemedicine).  Patients are able to view lab/test results, encounter notes, upcoming appointments, etc.  Non-urgent messages can be sent to your provider as well.   To learn more about what you can do with MyChart, go to [email or web address].    Your next appointment:   6 month(s)  The format for your next appointment:   In Person  Provider:   You may see Mertie Moores, MD or one of the following Advanced Practice Providers on your designated Care Team:    Richardson Dopp, PA-C  Robbie Lis, PA-C    Other Instructions      Signed, Leanor Kail, [place]  03/01/2020 3:32 PM    [address], [place], [place]  [address] Phone: [phone]; Fax: [phone]

## 2020-03-02 ENCOUNTER — Ambulatory Visit
Admission: RE | Admit: 2020-03-02 | Discharge: 2020-03-02 | Disposition: A | Discharge status: Home / Self Care | Payer: Medicare HMO | Source: Ambulatory Visit | Attending: Radiation Oncology | Admitting: Radiation Oncology

## 2020-03-02 ENCOUNTER — Other Ambulatory Visit: Payer: Self-pay

## 2020-03-02 DIAGNOSIS — Z510 Encounter for antineoplastic radiation therapy: Secondary | ICD-10-CM | POA: Diagnosis not present

## 2020-03-02 DIAGNOSIS — C61 Malignant neoplasm of prostate: Secondary | ICD-10-CM | POA: Diagnosis not present

## 2020-03-05 ENCOUNTER — Ambulatory Visit
Admission: RE | Admit: 2020-03-05 | Discharge: 2020-03-05 | Disposition: A | Discharge status: Home / Self Care | Payer: Medicare HMO | Source: Ambulatory Visit | Attending: Radiation Oncology | Admitting: Radiation Oncology

## 2020-03-05 ENCOUNTER — Other Ambulatory Visit: Payer: Self-pay

## 2020-03-05 DIAGNOSIS — Z510 Encounter for antineoplastic radiation therapy: Secondary | ICD-10-CM | POA: Diagnosis not present

## 2020-03-05 DIAGNOSIS — C61 Malignant neoplasm of prostate: Secondary | ICD-10-CM | POA: Diagnosis not present

## 2020-03-06 ENCOUNTER — Ambulatory Visit
Admission: RE | Admit: 2020-03-06 | Discharge: 2020-03-06 | Disposition: A | Discharge status: Home / Self Care | Payer: Medicare HMO | Source: Ambulatory Visit | Attending: Radiation Oncology | Admitting: Radiation Oncology

## 2020-03-06 ENCOUNTER — Other Ambulatory Visit: Payer: Self-pay

## 2020-03-06 DIAGNOSIS — Z510 Encounter for antineoplastic radiation therapy: Secondary | ICD-10-CM | POA: Diagnosis not present

## 2020-03-06 DIAGNOSIS — C61 Malignant neoplasm of prostate: Secondary | ICD-10-CM | POA: Diagnosis not present

## 2020-03-07 ENCOUNTER — Other Ambulatory Visit: Payer: Self-pay

## 2020-03-07 ENCOUNTER — Ambulatory Visit
Admission: RE | Admit: 2020-03-07 | Discharge: 2020-03-07 | Disposition: A | Discharge status: Home / Self Care | Payer: Medicare HMO | Source: Ambulatory Visit | Attending: Radiation Oncology | Admitting: Radiation Oncology

## 2020-03-07 DIAGNOSIS — C61 Malignant neoplasm of prostate: Secondary | ICD-10-CM | POA: Diagnosis not present

## 2020-03-07 DIAGNOSIS — Z510 Encounter for antineoplastic radiation therapy: Secondary | ICD-10-CM | POA: Diagnosis not present

## 2020-03-08 ENCOUNTER — Other Ambulatory Visit: Payer: Self-pay

## 2020-03-08 ENCOUNTER — Ambulatory Visit
Admission: RE | Admit: 2020-03-08 | Discharge: 2020-03-08 | Disposition: A | Discharge status: Home / Self Care | Payer: Medicare HMO | Source: Ambulatory Visit | Attending: Radiation Oncology | Admitting: Radiation Oncology

## 2020-03-08 DIAGNOSIS — Z510 Encounter for antineoplastic radiation therapy: Secondary | ICD-10-CM | POA: Diagnosis not present

## 2020-03-08 DIAGNOSIS — C61 Malignant neoplasm of prostate: Secondary | ICD-10-CM | POA: Diagnosis not present

## 2020-03-09 ENCOUNTER — Other Ambulatory Visit: Payer: Self-pay

## 2020-03-09 ENCOUNTER — Ambulatory Visit
Admission: RE | Admit: 2020-03-09 | Discharge: 2020-03-09 | Disposition: A | Discharge status: Home / Self Care | Payer: Medicare HMO | Source: Ambulatory Visit | Attending: Radiation Oncology | Admitting: Radiation Oncology

## 2020-03-09 DIAGNOSIS — Z510 Encounter for antineoplastic radiation therapy: Secondary | ICD-10-CM | POA: Diagnosis not present

## 2020-03-09 DIAGNOSIS — C61 Malignant neoplasm of prostate: Secondary | ICD-10-CM | POA: Diagnosis not present

## 2020-03-12 ENCOUNTER — Ambulatory Visit
Admission: RE | Admit: 2020-03-12 | Discharge: 2020-03-12 | Disposition: A | Discharge status: Home / Self Care | Payer: Medicare HMO | Source: Ambulatory Visit | Attending: Radiation Oncology | Admitting: Radiation Oncology

## 2020-03-12 ENCOUNTER — Other Ambulatory Visit: Payer: Self-pay

## 2020-03-12 DIAGNOSIS — C61 Malignant neoplasm of prostate: Secondary | ICD-10-CM | POA: Diagnosis not present

## 2020-03-12 DIAGNOSIS — Z510 Encounter for antineoplastic radiation therapy: Secondary | ICD-10-CM | POA: Diagnosis not present

## 2020-03-13 ENCOUNTER — Other Ambulatory Visit: Payer: Self-pay

## 2020-03-13 ENCOUNTER — Ambulatory Visit
Admission: RE | Admit: 2020-03-13 | Discharge: 2020-03-13 | Disposition: A | Discharge status: Home / Self Care | Payer: Medicare HMO | Source: Ambulatory Visit | Attending: Radiation Oncology | Admitting: Radiation Oncology

## 2020-03-13 DIAGNOSIS — Z510 Encounter for antineoplastic radiation therapy: Secondary | ICD-10-CM | POA: Diagnosis not present

## 2020-03-13 DIAGNOSIS — C61 Malignant neoplasm of prostate: Secondary | ICD-10-CM | POA: Diagnosis not present

## 2020-03-14 ENCOUNTER — Ambulatory Visit
Admission: RE | Admit: 2020-03-14 | Discharge: 2020-03-14 | Disposition: A | Discharge status: Home / Self Care | Payer: Medicare HMO | Source: Ambulatory Visit | Attending: Radiation Oncology | Admitting: Radiation Oncology

## 2020-03-14 ENCOUNTER — Other Ambulatory Visit: Payer: Self-pay

## 2020-03-14 DIAGNOSIS — Z510 Encounter for antineoplastic radiation therapy: Secondary | ICD-10-CM | POA: Diagnosis not present

## 2020-03-14 DIAGNOSIS — C61 Malignant neoplasm of prostate: Secondary | ICD-10-CM | POA: Diagnosis not present

## 2020-03-15 ENCOUNTER — Ambulatory Visit
Admission: RE | Admit: 2020-03-15 | Discharge: 2020-03-15 | Disposition: A | Discharge status: Home / Self Care | Payer: Medicare HMO | Source: Ambulatory Visit | Attending: Radiation Oncology | Admitting: Radiation Oncology

## 2020-03-15 ENCOUNTER — Other Ambulatory Visit: Payer: Self-pay

## 2020-03-15 DIAGNOSIS — C61 Malignant neoplasm of prostate: Secondary | ICD-10-CM | POA: Diagnosis not present

## 2020-03-15 DIAGNOSIS — Z510 Encounter for antineoplastic radiation therapy: Secondary | ICD-10-CM | POA: Diagnosis not present

## 2020-03-16 ENCOUNTER — Ambulatory Visit
Admission: RE | Admit: 2020-03-16 | Discharge: 2020-03-16 | Disposition: A | Discharge status: Home / Self Care | Payer: Medicare HMO | Source: Ambulatory Visit | Attending: Radiation Oncology | Admitting: Radiation Oncology

## 2020-03-16 ENCOUNTER — Other Ambulatory Visit: Payer: Self-pay

## 2020-03-16 DIAGNOSIS — Z510 Encounter for antineoplastic radiation therapy: Secondary | ICD-10-CM | POA: Diagnosis not present

## 2020-03-16 DIAGNOSIS — C61 Malignant neoplasm of prostate: Secondary | ICD-10-CM | POA: Diagnosis not present

## 2020-03-19 ENCOUNTER — Other Ambulatory Visit: Payer: Self-pay

## 2020-03-19 ENCOUNTER — Ambulatory Visit
Admission: RE | Admit: 2020-03-19 | Discharge: 2020-03-19 | Disposition: A | Discharge status: Home / Self Care | Payer: Medicare HMO | Source: Ambulatory Visit | Attending: Radiation Oncology | Admitting: Radiation Oncology

## 2020-03-19 DIAGNOSIS — C61 Malignant neoplasm of prostate: Secondary | ICD-10-CM | POA: Diagnosis not present

## 2020-03-19 DIAGNOSIS — Z510 Encounter for antineoplastic radiation therapy: Secondary | ICD-10-CM | POA: Diagnosis not present

## 2020-03-20 ENCOUNTER — Ambulatory Visit
Admission: RE | Admit: 2020-03-20 | Discharge: 2020-03-20 | Disposition: A | Discharge status: Home / Self Care | Payer: Medicare HMO | Source: Ambulatory Visit | Attending: Radiation Oncology | Admitting: Radiation Oncology

## 2020-03-20 ENCOUNTER — Other Ambulatory Visit: Payer: Self-pay

## 2020-03-20 DIAGNOSIS — Z510 Encounter for antineoplastic radiation therapy: Secondary | ICD-10-CM | POA: Diagnosis not present

## 2020-03-20 DIAGNOSIS — C61 Malignant neoplasm of prostate: Secondary | ICD-10-CM | POA: Diagnosis not present

## 2020-03-21 ENCOUNTER — Other Ambulatory Visit: Payer: Self-pay

## 2020-03-21 ENCOUNTER — Ambulatory Visit
Admission: RE | Admit: 2020-03-21 | Discharge: 2020-03-21 | Disposition: A | Discharge status: Home / Self Care | Payer: Medicare HMO | Source: Ambulatory Visit | Attending: Radiation Oncology | Admitting: Radiation Oncology

## 2020-03-21 DIAGNOSIS — C61 Malignant neoplasm of prostate: Secondary | ICD-10-CM | POA: Diagnosis not present

## 2020-03-21 DIAGNOSIS — Z510 Encounter for antineoplastic radiation therapy: Secondary | ICD-10-CM | POA: Diagnosis not present

## 2020-03-22 ENCOUNTER — Ambulatory Visit
Admission: RE | Admit: 2020-03-22 | Discharge: 2020-03-22 | Disposition: A | Discharge status: Home / Self Care | Payer: Medicare HMO | Source: Ambulatory Visit | Attending: Radiation Oncology | Admitting: Radiation Oncology

## 2020-03-22 DIAGNOSIS — Z510 Encounter for antineoplastic radiation therapy: Secondary | ICD-10-CM | POA: Diagnosis not present

## 2020-03-22 DIAGNOSIS — C61 Malignant neoplasm of prostate: Secondary | ICD-10-CM | POA: Diagnosis not present

## 2020-03-23 ENCOUNTER — Ambulatory Visit
Admission: RE | Admit: 2020-03-23 | Discharge: 2020-03-23 | Disposition: A | Discharge status: Home / Self Care | Payer: Medicare HMO | Source: Ambulatory Visit | Attending: Radiation Oncology | Admitting: Radiation Oncology

## 2020-03-23 ENCOUNTER — Other Ambulatory Visit: Payer: Self-pay

## 2020-03-23 DIAGNOSIS — C61 Malignant neoplasm of prostate: Secondary | ICD-10-CM | POA: Diagnosis not present

## 2020-03-23 DIAGNOSIS — Z510 Encounter for antineoplastic radiation therapy: Secondary | ICD-10-CM | POA: Diagnosis not present

## 2020-03-27 ENCOUNTER — Ambulatory Visit
Admission: RE | Admit: 2020-03-27 | Discharge: 2020-03-27 | Disposition: A | Discharge status: Home / Self Care | Payer: Medicare HMO | Source: Ambulatory Visit | Attending: Radiation Oncology | Admitting: Radiation Oncology

## 2020-03-27 ENCOUNTER — Encounter: Payer: Self-pay | Admitting: Radiation Oncology

## 2020-03-27 ENCOUNTER — Other Ambulatory Visit: Payer: Self-pay

## 2020-03-27 ENCOUNTER — Other Ambulatory Visit: Payer: Self-pay | Admitting: Vascular Surgery

## 2020-03-27 DIAGNOSIS — C61 Malignant neoplasm of prostate: Secondary | ICD-10-CM | POA: Insufficient documentation

## 2020-03-27 DIAGNOSIS — Z510 Encounter for antineoplastic radiation therapy: Secondary | ICD-10-CM | POA: Diagnosis not present

## 2020-03-29 ENCOUNTER — Encounter: Payer: Self-pay | Admitting: Medical Oncology

## 2020-04-25 DIAGNOSIS — E1142 Type 2 diabetes mellitus with diabetic polyneuropathy: Secondary | ICD-10-CM | POA: Diagnosis not present

## 2020-04-25 DIAGNOSIS — I10 Essential (primary) hypertension: Secondary | ICD-10-CM | POA: Diagnosis not present

## 2020-04-25 DIAGNOSIS — E1165 Type 2 diabetes mellitus with hyperglycemia: Secondary | ICD-10-CM | POA: Diagnosis not present

## 2020-04-25 DIAGNOSIS — E785 Hyperlipidemia, unspecified: Secondary | ICD-10-CM | POA: Diagnosis not present

## 2020-04-25 DIAGNOSIS — Z794 Long term (current) use of insulin: Secondary | ICD-10-CM | POA: Diagnosis not present

## 2020-05-01 ENCOUNTER — Encounter: Payer: Self-pay | Admitting: Urology

## 2020-05-01 ENCOUNTER — Telehealth: Payer: Self-pay

## 2020-05-01 ENCOUNTER — Other Ambulatory Visit: Payer: Self-pay

## 2020-05-01 NOTE — Telephone Encounter (Signed)
Spoke with Patient is regards to 1 month telephone follow- up appointment with Freeman Caldron PA on 05/03/20 at 1:00pm. Patient verbalized understanding of appointment date and time. Meaningful use , AUA and prostate questions were reviewed. TM

## 2020-05-01 NOTE — Progress Notes (Signed)
Patient presents for a 1 month telephone follow-up for Prostate Cancer after radiation treatment. Patient states nocturia 2 times per night. Patient denies dysuria. Patient states moderate fatigue. Patient states having urgency. Patient denies leakage of urine. Patient denies having any hesitancy or straining. Patient states that he is emptying his bladder completely.

## 2020-05-03 ENCOUNTER — Ambulatory Visit
Admission: RE | Admit: 2020-05-03 | Discharge: 2020-05-03 | Disposition: A | Discharge status: Home / Self Care | Payer: Medicare HMO | Source: Ambulatory Visit | Attending: Urology | Admitting: Urology

## 2020-05-03 ENCOUNTER — Other Ambulatory Visit: Payer: Self-pay

## 2020-05-03 DIAGNOSIS — C61 Malignant neoplasm of prostate: Secondary | ICD-10-CM

## 2020-05-03 NOTE — Progress Notes (Signed)
Radiation Oncology         ([phone]) [phone] ________________________________  Name: [name] MRN: [id number]  Date: 05/03/2020  DOB: [birth year]  Post Treatment Note  CC: [name], Connor Him, MD  [name], Connor Him, MD  Diagnosis:   74 y.o. gentleman with Stage T1c adenocarcinoma of the prostate with Gleason score of 4+3, and PSA of 9.25  Interval Since Last Radiation:  5.5 weeks  02/16/20 - 03/27/20: The prostate was treated to 70 Gy in 28 fractions of 2.5 Gy each, concurrent with ST-ADT (6 month Eligard injection given 12/12/19).  Narrative:  I spoke with the patient to conduct his routine scheduled 1 month follow up visit via telephone to spare the patient unnecessary potential exposure in the healthcare setting during the current COVID-19 pandemic.  The patient was notified in advance and gave permission to proceed with this visit format. He tolerated his radiation treatments relatively well with only mild urinary symptoms and moderate fatigue.  He did experience dysuria at initiation of stream and nocturia x3 but denied gross hematuria, straining to void, weak stream, incomplete bladder emptying or incontinence.  He did not have any abdominal pain or bowel issues and continued to tolerate the ADT fairly well.                       On review of systems, the patient states that he is doing well in general.  He reports that the nocturia has improved back to his baseline at 2 times per night and he has had resolution of the dysuria.  He does have some mild increased frequency and urgency but reports a strong stream and denies hesitancy, intermittency, straining or incomplete emptying.  His current IPSS score is 4 and his pretreatment IPSS was 3.  He continues with mild fatigue but feels that this is gradually improving.  He denies abdominal pain, nausea, vomiting, diarrhea or constipation.  He reports a healthy appetite and is maintaining his weight.  Overall, he is quite pleased with his progress to  date.  ALLERGIES:  is allergic to lipitor [atorvastatin calcium], sulfa drugs cross reactors, and codeine.  Meds: Current Outpatient Medications  Medication Sig Dispense Refill   amLODipine-benazepril (LOTREL) 5-20 MG capsule Take 1 capsule by mouth daily. 90 capsule 3   aspirin EC 81 MG tablet Take 81 mg by mouth daily.     clopidogrel (PLAVIX) 75 MG tablet Take 1 tablet by mouth once daily 90 tablet 0   hydrochlorothiazide (HYDRODIURIL) 25 MG tablet Take 1 tablet (25 mg total) by mouth daily. 90 tablet 3   insulin NPH Human (HUMULIN N) 100 UNIT/ML injection Inject 45 units [place] BID     insulin regular (NOVOLIN R) 100 units/mL injection Inject up to 20 units [place] TID meals     metFORMIN (GLUCOPHAGE-XR) 500 MG 24 hr tablet Take 500 mg by mouth 2 (two) times daily with a meal.      metoprolol tartrate (LOPRESSOR) 50 MG tablet Take 1 tablet (50 mg total) by mouth 2 (two) times daily. 180 tablet 3   nitroGLYCERIN (NITROSTAT) 0.4 MG SL tablet Place 1 tablet (0.4 mg total) under the tongue every 5 (five) minutes as needed for chest pain. 25 tablet 6   omeprazole (PRILOSEC) 20 MG capsule Take 20 mg by mouth daily.     rosuvastatin (CRESTOR) 20 MG tablet Take 1 tablet (20 mg total) by mouth daily. 90 tablet 3   No current facility-administered medications for this  encounter.    Physical Findings:  vitals were not taken for this visit.   /Unable to assess due to telephone follow-up visit format.  Lab Findings: Lab Results  Component Value Date   WBC 8.3 08/27/2013   HGB 16.7 11/13/2017   HCT 49.0 11/13/2017   MCV 90.2 08/27/2013   PLT 152 08/27/2013     Radiographic Findings: No results found.  Impression/Plan: 1. 74 y.o. gentleman with Stage T1c adenocarcinoma of the prostate with Gleason score of 4+3, and PSA of 9.25. He will continue to follow up with urology for ongoing PSA determinations but does not have an appointment scheduled with Dr. Louis Meckel to his knowledge. He  understands what to expect with regards to PSA monitoring going forward. I will look forward to following his response to treatment via correspondence with urology, and would be happy to continue to participate in his care if clinically indicated. I talked to the patient about what to expect in the future, including his risk for erectile dysfunction and rectal bleeding. I encouraged Duke to call or return to the office if he has any questions regarding his previous radiation or possible radiation side effects. He was comfortable with this plan and will follow up as needed.  Today, a comprehensive survivorship care plan and treatment summary was reviewed with the patient today detailing his prostate cancer diagnosis, treatment course, potential late/long-term effects of treatment, appropriate follow-up care with recommendations for the future, and patient education resources.  A copy of this summary, along with a letter will be sent to the patients primary care provider via mail/fax/In Basket message after todays visit.  2. Cancer screening:  Due to [name] history and his age, he should receive screening for skin cancers, colon cancer, and lung cancer.  The information and recommendations are listed on the patient's comprehensive care plan/treatment summary and were reviewed in detail with the patient.     3. Health maintenance and wellness promotion: [name] was encouraged to consume 5-7 servings of fruits and vegetables per day. He was provided a copy of the "Nutrition Rainbow" handout, as well as the handout "Take Control of Your Health and [place]" from the [place].  He was also encouraged to engage in moderate to vigorous exercise for 30 minutes per day most days of the week. Information was provided regarding the [facility] fitness program, which is designed for cancer survivors to help them become more physically fit after cancer treatments. We discussed that a  healthy BMI is 18.5-24.9 and that maintaining a healthy weight reduces risk of cancer recurrences.  He was instructed to limit his alcohol consumption and continue to abstain from tobacco use.  Lastly, he was encouraged to use sunscreen and wear protective clothing when in the sun.     4. Support services/counseling: It is not uncommon for this period of the patient's cancer care trajectory to be one of many emotions and stressors.  Mr. Scheffler was encouraged to take advantage of our many support services programs, support groups, and/or counseling in coping with his new life as a cancer survivor after completing anti-cancer treatment.  He was offered support today through active listening and expressive supportive counseling.  He was given information regarding our available services and encouraged to contact me with any questions or for help enrolling in any of our support group/programs.      Nicholos Johns, PA-C

## 2020-06-13 ENCOUNTER — Other Ambulatory Visit: Payer: Self-pay

## 2020-06-13 MED ORDER — AMLODIPINE BESY-BENAZEPRIL HCL 5-20 MG PO CAPS: 1.0000 | ORAL_CAPSULE | Freq: Every day | ORAL | 2 refills | Status: DC

## 2020-06-22 ENCOUNTER — Other Ambulatory Visit: Payer: Self-pay | Admitting: Vascular Surgery

## 2020-06-25 DIAGNOSIS — E1151 Type 2 diabetes mellitus with diabetic peripheral angiopathy without gangrene: Secondary | ICD-10-CM | POA: Diagnosis not present

## 2020-06-25 DIAGNOSIS — C61 Malignant neoplasm of prostate: Secondary | ICD-10-CM | POA: Diagnosis not present

## 2020-06-25 DIAGNOSIS — I739 Peripheral vascular disease, unspecified: Secondary | ICD-10-CM | POA: Diagnosis not present

## 2020-06-25 DIAGNOSIS — E7800 Pure hypercholesterolemia, unspecified: Secondary | ICD-10-CM | POA: Diagnosis not present

## 2020-06-25 DIAGNOSIS — I6529 Occlusion and stenosis of unspecified carotid artery: Secondary | ICD-10-CM | POA: Diagnosis not present

## 2020-06-25 DIAGNOSIS — K2270 Barrett's esophagus without dysplasia: Secondary | ICD-10-CM | POA: Diagnosis not present

## 2020-06-25 DIAGNOSIS — I119 Hypertensive heart disease without heart failure: Secondary | ICD-10-CM | POA: Diagnosis not present

## 2020-06-25 DIAGNOSIS — I25810 Atherosclerosis of coronary artery bypass graft(s) without angina pectoris: Secondary | ICD-10-CM | POA: Diagnosis not present

## 2020-06-28 DIAGNOSIS — C61 Malignant neoplasm of prostate: Secondary | ICD-10-CM | POA: Diagnosis not present

## 2020-07-18 ENCOUNTER — Encounter (HOSPITAL_COMMUNITY): Payer: Medicare HMO

## 2020-07-19 ENCOUNTER — Ambulatory Visit: Payer: Medicare HMO | Admitting: Vascular Surgery

## 2020-07-31 ENCOUNTER — Other Ambulatory Visit: Payer: Self-pay | Admitting: *Deleted

## 2020-07-31 DIAGNOSIS — I739 Peripheral vascular disease, unspecified: Secondary | ICD-10-CM

## 2020-07-31 DIAGNOSIS — I6523 Occlusion and stenosis of bilateral carotid arteries: Secondary | ICD-10-CM

## 2020-08-02 DIAGNOSIS — E113293 Type 2 diabetes mellitus with mild nonproliferative diabetic retinopathy without macular edema, bilateral: Secondary | ICD-10-CM | POA: Diagnosis not present

## 2020-08-02 DIAGNOSIS — H2513 Age-related nuclear cataract, bilateral: Secondary | ICD-10-CM | POA: Diagnosis not present

## 2020-08-02 DIAGNOSIS — H524 Presbyopia: Secondary | ICD-10-CM | POA: Diagnosis not present

## 2020-08-08 DIAGNOSIS — Z23 Encounter for immunization: Secondary | ICD-10-CM | POA: Diagnosis not present

## 2020-08-09 ENCOUNTER — Encounter (HOSPITAL_COMMUNITY): Payer: Medicare HMO

## 2020-08-09 ENCOUNTER — Ambulatory Visit: Payer: Medicare HMO | Admitting: Vascular Surgery

## 2020-08-16 DIAGNOSIS — Z794 Long term (current) use of insulin: Secondary | ICD-10-CM | POA: Diagnosis not present

## 2020-08-16 DIAGNOSIS — E785 Hyperlipidemia, unspecified: Secondary | ICD-10-CM | POA: Diagnosis not present

## 2020-08-16 DIAGNOSIS — E1165 Type 2 diabetes mellitus with hyperglycemia: Secondary | ICD-10-CM | POA: Diagnosis not present

## 2020-08-16 DIAGNOSIS — I10 Essential (primary) hypertension: Secondary | ICD-10-CM | POA: Diagnosis not present

## 2020-08-16 DIAGNOSIS — E1142 Type 2 diabetes mellitus with diabetic polyneuropathy: Secondary | ICD-10-CM | POA: Diagnosis not present

## 2020-08-23 ENCOUNTER — Ambulatory Visit (INDEPENDENT_AMBULATORY_CARE_PROVIDER_SITE_OTHER): Payer: Medicare HMO | Admitting: Vascular Surgery

## 2020-08-23 ENCOUNTER — Other Ambulatory Visit: Payer: Self-pay

## 2020-08-23 ENCOUNTER — Encounter: Payer: Self-pay | Admitting: Vascular Surgery

## 2020-08-23 ENCOUNTER — Ambulatory Visit (INDEPENDENT_AMBULATORY_CARE_PROVIDER_SITE_OTHER)
Admission: RE | Admit: 2020-08-23 | Discharge: 2020-08-23 | Disposition: A | Discharge status: Home / Self Care | Payer: Medicare HMO | Source: Ambulatory Visit | Attending: Vascular Surgery | Admitting: Vascular Surgery

## 2020-08-23 ENCOUNTER — Ambulatory Visit (HOSPITAL_COMMUNITY)
Admission: RE | Admit: 2020-08-23 | Discharge: 2020-08-23 | Disposition: A | Discharge status: Home / Self Care | Payer: Medicare HMO | Source: Ambulatory Visit | Attending: Vascular Surgery | Admitting: Vascular Surgery

## 2020-08-23 VITALS — BP 184/67 | HR 50 | Temp 97.7°F | Resp 20 | Ht 71.0 in | Wt 187.0 lb

## 2020-08-23 DIAGNOSIS — I6522 Occlusion and stenosis of left carotid artery: Secondary | ICD-10-CM

## 2020-08-23 DIAGNOSIS — I6523 Occlusion and stenosis of bilateral carotid arteries: Secondary | ICD-10-CM | POA: Insufficient documentation

## 2020-08-23 DIAGNOSIS — I739 Peripheral vascular disease, unspecified: Secondary | ICD-10-CM

## 2020-08-23 NOTE — Progress Notes (Signed)
Patient is a 74 year old male who returns for follow-up today.  He had a virtual visit in July 2020.  He has previously undergone carotid stenting as well as right superficial femoral artery stent.  At his last visit his ABIs were 0.6 bilaterally.  He did not have any claudication symptoms at that time.  However, he states that his left leg is acting up some currently.  He develops tightness in the calf after walking a few blocks.  Right leg is not far behind it.  He has not had any symptoms of TIA amaurosis or stroke.  He is on a statin aspirin and Plavix.  Current Outpatient Medications on File Prior to Visit  Medication Sig Dispense Refill  . amLODipine-benazepril (LOTREL) 5-20 MG capsule Take 1 capsule by mouth daily. 90 capsule 2  . aspirin EC 81 MG tablet Take 81 mg by mouth daily.    . clopidogrel (PLAVIX) 75 MG tablet Take 1 tablet by mouth once daily 90 tablet 0  . hydrochlorothiazide (HYDRODIURIL) 25 MG tablet Take 1 tablet (25 mg total) by mouth daily. 90 tablet 3  . insulin NPH Human (HUMULIN N) 100 UNIT/ML injection Inject 45 units [place] BID    . insulin regular (NOVOLIN R) 100 units/mL injection Inject up to 20 units [place] TID meals    . metFORMIN (GLUCOPHAGE-XR) 500 MG 24 hr tablet Take 500 mg by mouth 2 (two) times daily with a meal.     . metoprolol tartrate (LOPRESSOR) 50 MG tablet Take 1 tablet (50 mg total) by mouth 2 (two) times daily. 180 tablet 3  . nitroGLYCERIN (NITROSTAT) 0.4 MG SL tablet Place 1 tablet (0.4 mg total) under the tongue every 5 (five) minutes as needed for chest pain. 25 tablet 6  . omeprazole (PRILOSEC) 20 MG capsule Take 20 mg by mouth daily.    . rosuvastatin (CRESTOR) 20 MG tablet Take 1 tablet (20 mg total) by mouth daily. 90 tablet 3   No current facility-administered medications on file prior to visit.   Physical exam:  Vitals:   08/23/20 0916 08/23/20 0919  BP: (!) 168/68 (!) 184/67  Pulse: (!) 50   Resp: 20   Temp: 97.7 F (36.5 C)   SpO2: 97%    Weight: 187 lb (84.8 kg)   Height: 5\' 11"  (1.803 m)    Extremities: 2+ femoral pulses absent popliteal and pedal pulses bilaterally  Skin: No ulcer on the feet  Neuro: Symmetric upper extremity lower extremity motor strength 5/5 no facial asymmetry   Data: Patient had bilateral ABIs performed today.  Right side was 0.8 left side was 0.7 I reviewed and interpreted the studies.  They are essentially unchanged from 1 year ago.  Patient had bilateral carotid duplex scan today which I reviewed and interpreted.  No significant right-sided stenosis.  Elevated velocities in the mid left stent possibly greater than 75%.  This is a significant increase from his carotid duplex scan in June 2020.  I reviewed and interpreted the study.  Assessment: #1 peripheral arterial disease stable claudication symptoms.  I discussed with the patient the possibility of repeat arteriogram he has opted for walking program for now.  He still feels very deconditioned from recent prostate cancer radiation therapy.  2.  Possible recurrent stenosis of left carotid stent.  We will schedule the patient for a CT angiogram of the head and neck within the next 2 to 3 weeks and further evaluation based on this.  Plan: See above  Juanda Crumble  Sabryna Lahm, MD [facility] Office: [phone]

## 2020-08-31 ENCOUNTER — Other Ambulatory Visit: Payer: Self-pay

## 2020-08-31 ENCOUNTER — Ambulatory Visit: Payer: Medicare HMO | Admitting: Cardiovascular Disease

## 2020-08-31 ENCOUNTER — Encounter: Payer: Self-pay | Admitting: Cardiovascular Disease

## 2020-08-31 VITALS — BP 138/56 | HR 56 | Ht 71.0 in | Wt 189.8 lb

## 2020-08-31 DIAGNOSIS — I6523 Occlusion and stenosis of bilateral carotid arteries: Secondary | ICD-10-CM | POA: Diagnosis not present

## 2020-08-31 DIAGNOSIS — I2510 Atherosclerotic heart disease of native coronary artery without angina pectoris: Secondary | ICD-10-CM | POA: Diagnosis not present

## 2020-08-31 DIAGNOSIS — I10 Essential (primary) hypertension: Secondary | ICD-10-CM | POA: Diagnosis not present

## 2020-08-31 MED ORDER — NITROGLYCERIN 0.4 MG SL SUBL: 0.4000 mg | SUBLINGUAL_TABLET | SUBLINGUAL | 6 refills | Status: DC | PRN

## 2020-08-31 NOTE — Progress Notes (Signed)
Cardiology Office Note   Date:  08/31/2020   ID:  [name], [name] 1945/12/17, MRN [id number]  PCP:  Haywood Pao, MD  Cardiologist: previously Darlin Coco MD , now Mertie Moores, MD   Chief Complaint  Patient presents with  . Coronary Artery Disease  . Hyperlipidemia   Problem list 1. Coronary artery disease-status post CABG 2003 2. Hyperlipidemia 3. Carotid artery disease-status post left carotid stenting , s/p Right CEA  4. Essential hypertension 5. Diabetes mellitus     [name] is a 74 y.o. male who presents for a six-month follow-up visit  Notes from Dr. Mare Ferrari: . He has a past history of coronary artery disease and had coronary artery bypass graft surgery in 2003. He has a remote history of a right carotid endarterectomy about 18 years ago. He had a left carotid endarterectomy by Dr. Oneida Alar in 2014. He has a history of high blood pressure, diabetes mellitus, and hyperlipidemia. He had a normal nuclear stress test in June 2012 prior to rotator cuff surgery. The patient developed claudication of his right leg  and underwent successful vascular surgery in February 2015. Since last visit he has been doing well with no chest pain or shortness of breath. Since last visit he has retired and has been enjoying retirement. He is no longer experiencing any right calf claudication. He has diabetes followed by Dr. Tamala Julian at [facility]. His most recent A1c was 7.2 Since last visit the patient has had no new cardiac symptoms.  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  He himself lives in a townhouse.  Aug. 18, 2017:  Mr. Yohe is seen today for the first time. He's a previous patient of Dr. Mare Ferrari. He is followed for coronary artery disease, hyperlipidemia, carotid artery disease, hypertension.  Doing well.   Stays active.   Does lots of woodworking . Walks often   No angina    Feb. 14, 2018; Doing well.  No CP or dyspnea. Not exercising much ,  Avoids salt.   Oct. 1, 2018:  Here for eval of HTN. Still eating some salty food . Avoids hot dogs now .  stopped smoking years ago. As active as he can be,  Has a heel spur  February 23, 2018:  BP hyas been normal blood pressure is a bit high Is avoiding hot dogs  No CP or dyspnea.  Has a left carotid stent, s/p R CEA   August 24, 2018: [name]  is seen today for follow-up of his coronary artery disease, hypertension and carotid artery disease. Staying active,  Works in the garden,  Works in his woodworking shop  September 06, 2020: [name] is seen today for a follow-up of his coronary artery disease, coronary artery bypass grafting and carotid artery disease. Has prostate cancer,  Getting XRT.  Still doing some wood working.   Staying active  Has not felt well.   Past Medical History:  Diagnosis Date  . Arthritis   . CAD (coronary artery disease)   . Carotid artery occlusion   . Diabetes mellitus    fasting 100-120s  . Dizziness   . Dysrhythmia    skips beats  . GERD (gastroesophageal reflux disease)   . History of kidney stones   . Hyperlipidemia   . Hypertension   . IHD (ischemic heart disease)    Prior CABG in 2003  . Normal nuclear  stress test June 2012  . Peripheral vascular disease ([place])   . Prostate cancer ([place])   . Torn rotator cuff     Past Surgical History:  Procedure Laterality Date  . ABDOMINAL AORTAGRAM N/A 12/21/2013   Procedure: ABDOMINAL Maxcine Ham;  Surgeon: Elam Dutch, MD;  Location: [facility] CATH LAB;  Service: Cardiovascular;  Laterality: N/A;  . ABDOMINAL AORTOGRAM N/A 11/13/2017   Procedure: ABDOMINAL AORTOGRAM;  Surgeon: Elam Dutch, MD;  Location: [facility] CV LAB;  Service: Cardiovascular;  Laterality: N/A;  . APPENDECTOMY    . ARCH AORTOGRAM  08/26/2013   Procedure: ARCH AORTOGRAM;  Surgeon: Elam Dutch, MD;  Location: [facility] CATH LAB;  Service:  Cardiovascular;;  . CARDIAC CATHETERIZATION  05/03/2002   EF 40-45%  . CARDIOVASCULAR STRESS TEST  08/10/2006   EF 65%  . CAROTID ENDARTERECTOMY    . CAROTID STENT INSERTION Left 08/26/2013   Procedure: CAROTID STENT INSERTION;  Surgeon: Elam Dutch, MD;  Location: [facility] CATH LAB;  Service: Cardiovascular;  Laterality: Left;  internal carotid  . CORONARY ARTERY BYPASS GRAFT  04/2002  . DOPPLER ECHOCARDIOGRAPHY  08/12/2002   EF 80-85%  . ENDARTERECTOMY Left 07/20/2013   Procedure: ATTEMPTED ENDARTERECTOMY CAROTID;  Surgeon: Conrad Susank, MD;  Location: [facility];  Service: Vascular;  Laterality: Left;  . HERNIA REPAIR    . LOWER EXTREMITY ANGIOGRAPHY  11/13/2017   Procedure: Lower Extremity Angiography;  Surgeon: Elam Dutch, MD;  Location: [facility] CV LAB;  Service: Cardiovascular;;  . PERIPHERAL VASCULAR INTERVENTION Right 11/13/2017   Procedure: PERIPHERAL VASCULAR INTERVENTION;  Surgeon: Elam Dutch, MD;  Location: [facility] CV LAB;  Service: Cardiovascular;  Laterality: Right;  superficial femoral  . SHOULDER SURGERY       Current Outpatient Medications  Medication Sig Dispense Refill  . amLODipine-benazepril (LOTREL) 5-20 MG capsule Take 1 capsule by mouth daily. 90 capsule 2  . aspirin EC 81 MG tablet Take 81 mg by mouth daily.    . clopidogrel (PLAVIX) 75 MG tablet Take 1 tablet by mouth once daily 90 tablet 0  . hydrochlorothiazide (HYDRODIURIL) 25 MG tablet Take 1 tablet (25 mg total) by mouth daily. 90 tablet 3  . insulin NPH Human (HUMULIN N) 100 UNIT/ML injection Inject 45 units [place] BID    . insulin regular (NOVOLIN R) 100 units/mL injection Inject up to 20 units [place] TID meals    . metFORMIN (GLUCOPHAGE-XR) 500 MG 24 hr tablet Take 500 mg by mouth 2 (two) times daily with a meal.     . metoprolol tartrate (LOPRESSOR) 50 MG tablet Take 1 tablet (50 mg total) by mouth 2 (two) times daily. 180 tablet 3  . nitroGLYCERIN (NITROSTAT) 0.4 MG SL tablet Place 1 tablet (0.4  mg total) under the tongue every 5 (five) minutes as needed for chest pain. 25 tablet 6  . omeprazole (PRILOSEC) 20 MG capsule Take 20 mg by mouth daily.    . rosuvastatin (CRESTOR) 20 MG tablet Take 1 tablet (20 mg total) by mouth daily. 90 tablet 3   No current facility-administered medications for this visit.    Allergies:   Lipitor [atorvastatin calcium], Sulfa drugs cross reactors, and Codeine    Social History:  The patient  reports that he quit smoking about 18 years ago. His smoking use included cigarettes. He has a 20.00 pack-year smoking history. He has never used smokeless tobacco. He reports that he does not drink alcohol and does not use drugs.  Family History:  The patient's family history includes Diabetes in his brother, daughter, mother, sister, and sister; Heart attack in his father and mother; Heart disease in his daughter, father, and mother; Hypertension in his daughter, father, and mother.    ROS:  Please see the history of present illness.   Otherwise, review of systems are positive for none.   All other systems are reviewed and negative.   Physical Exam: Blood pressure (!) 138/56, pulse (!) 56, height 5\' 11"  (1.803 m), weight 189 lb 12.8 oz (86.1 kg), SpO2 98 %.  GEN:  Middle age, appears older than stated age  46: Normal NECK: No JVD;  Bilateral carotid bruits.  LYMPHATICS: No lymphadenopathy CARDIAC: RRR , soft murmur  RESPIRATORY:  Clear  ABDOMEN: Soft, non-tender, non-distended MUSCULOSKELETAL:  No edema; No deformity  SKIN: Warm and dry NEUROLOGIC:  Alert and oriented x 3   EKG:      Recent Labs: No results found for requested labs within last 8760 hours.    Lipid Panel    Component Value Date/Time   CHOL 132 08/24/2018 0825   TRIG 149 08/24/2018 0825   HDL 34 (L) 08/24/2018 0825   CHOLHDL 3.9 08/24/2018 0825   CHOLHDL 3.2 06/13/2016 0918   VLDL 21 06/13/2016 0918   LDLCALC 68 08/24/2018 0825    Wt Readings from Last 3 Encounters:   08/31/20 189 lb 12.8 oz (86.1 kg)  08/23/20 187 lb (84.8 kg)  03/01/20 184 lb 9.6 oz (83.7 kg)      ASSESSMENT AND PLAN:  1.  CAD :     S/p CABG.    No angina   2. diabetes mellitus insulin-      3. hypertensive heart disease without heart failure.    BP is well controlled   4. Hyperlipidemia.   - lipids managed by Dr. Osborne Casco.   6. Bilateral carotid artery disease:   - followed by Dr. Oneida Alar.    Current medicines are reviewed at length with the patient today.  The patient does not have concerns regarding medicines.  The following changes have been made:  no change  Labs/ tests ordered today include:   No orders of the defined types were placed in this encounter.  Will see him in 1 year.    Mertie Moores, MD  08/31/2020 9:54 AM    [address],  [address], [place]  [address] Pager [phone] Phone: [phone]; Fax: [phone]

## 2020-08-31 NOTE — Patient Instructions (Signed)
Medication Instructions:  Your physician recommends that you continue on your current medications as directed. Please refer to the Current Medication list given to you today.  *If you need a refill on your cardiac medications before your next appointment, please call your pharmacy*   Lab Work: None ordered   Testing/Procedures: None ordered   Follow-Up: At [facility], you and your health needs are our priority.  As part of our continuing mission to provide you with exceptional heart care, we have created designated Provider Care Teams.  These Care Teams include your primary Cardiologist (physician) and Advanced Practice Providers (APPs -  Physician Assistants and Nurse Practitioners) who all work together to provide you with the care you need, when you need it.  We recommend signing up for the patient portal called "MyChart".  Sign up information is provided on this After Visit Summary.  MyChart is used to connect with patients for Virtual Visits (Telemedicine).  Patients are able to view lab/test results, encounter notes, upcoming appointments, etc.  Non-urgent messages can be sent to your provider as well.   To learn more about what you can do with MyChart, go to [email or web address].    Your next appointment:   1 year(s)  The format for your next appointment:   In Person  Provider:   Mertie Moores, MD    Thank you for choosing [facility]!!

## 2020-09-10 ENCOUNTER — Ambulatory Visit
Admission: RE | Admit: 2020-09-10 | Discharge: 2020-09-10 | Disposition: A | Discharge status: Home / Self Care | Payer: Medicare HMO | Source: Ambulatory Visit | Attending: Vascular Surgery | Admitting: Vascular Surgery

## 2020-09-10 ENCOUNTER — Other Ambulatory Visit: Payer: Self-pay

## 2020-09-10 DIAGNOSIS — I672 Cerebral atherosclerosis: Secondary | ICD-10-CM | POA: Diagnosis not present

## 2020-09-10 DIAGNOSIS — I6503 Occlusion and stenosis of bilateral vertebral arteries: Secondary | ICD-10-CM | POA: Diagnosis not present

## 2020-09-10 DIAGNOSIS — I6523 Occlusion and stenosis of bilateral carotid arteries: Secondary | ICD-10-CM | POA: Diagnosis not present

## 2020-09-10 DIAGNOSIS — G319 Degenerative disease of nervous system, unspecified: Secondary | ICD-10-CM | POA: Diagnosis not present

## 2020-09-10 DIAGNOSIS — I6522 Occlusion and stenosis of left carotid artery: Secondary | ICD-10-CM

## 2020-09-10 DIAGNOSIS — I700 Atherosclerosis of aorta: Secondary | ICD-10-CM | POA: Diagnosis not present

## 2020-09-10 DIAGNOSIS — G9389 Other specified disorders of brain: Secondary | ICD-10-CM | POA: Diagnosis not present

## 2020-09-10 MED ORDER — IOPAMIDOL (ISOVUE-370) INJECTION 76%
75.0000 mL | Freq: Once | INTRAVENOUS | Status: AC | PRN
  Administered 2020-09-10: 75 mL via INTRAVENOUS

## 2020-09-11 ENCOUNTER — Other Ambulatory Visit: Payer: Self-pay

## 2020-09-11 MED ORDER — METOPROLOL TARTRATE 50 MG PO TABS
50.0000 mg | ORAL_TABLET | Freq: Two times a day (BID) | ORAL | 3 refills | Status: DC
Start: 2020-09-11 — End: 2021-07-08

## 2020-09-13 ENCOUNTER — Ambulatory Visit: Payer: Medicare HMO | Admitting: Vascular Surgery

## 2020-09-13 ENCOUNTER — Encounter: Payer: Self-pay | Admitting: Vascular Surgery

## 2020-09-13 ENCOUNTER — Other Ambulatory Visit: Payer: Self-pay

## 2020-09-13 VITALS — BP 180/62 | HR 50 | Temp 98.0°F | Resp 20 | Ht 71.0 in | Wt 187.0 lb

## 2020-09-13 DIAGNOSIS — I739 Peripheral vascular disease, unspecified: Secondary | ICD-10-CM | POA: Diagnosis not present

## 2020-09-13 DIAGNOSIS — I6522 Occlusion and stenosis of left carotid artery: Secondary | ICD-10-CM

## 2020-09-13 NOTE — Progress Notes (Signed)
Patient is a 74 year old male who returns for follow-up today.  His most recent surveillance carotid duplex scan suggested restenosis of his left carotid stent.  He has not had any symptoms of TIA amaurosis or stroke.  He has previously undergone carotid stenting as well as a right superficial femoral artery stent.  He has some mild claudication symptoms in the left calf after a few blocks.  Right leg then has some pain a few blocks after that.  He is on Plavix aspirin and a statin.  We are currently not doing were considering any new interventions for his lower extremities as he is still recovering from prostate cancer treatment.  His most recent ABIs were 0.8 on the left 0.7 on the right were unchanged from the previous year.  Physical exam:  Vitals:   09/13/20 1020 09/13/20 1023  BP: (!) 182/70 (!) 180/62  Pulse: (!) 50   Resp: 20   Temp: 98 F (36.7 C)   SpO2: 98%   Weight: 187 lb (84.8 kg)   Height: 5\' 11"  (1.803 m)    Neuro: Symmetric upper extremity lower extremity motor strength 5/5 no facial asymmetry  Data: Patient had a CT angiogram of the head and neck on September 10, 2020.  This showed recurrent stenosis of about 60% of the left internal carotid artery.  There was no right carotid stenosis.  He also had about a 50% stenosis of the intracranial portion of his carotid artery and the origin of the right vertebral artery was calcified with high-grade stenosis.  Assessment: Moderate recurrent stenosis left internal carotid artery stent approximately 60% by CT scan.  Currently asymptomatic.  Peripheral arterial disease mild to moderate symptoms at several blocks but currently does not wish intervention  Plan: Patient will have a follow-up carotid duplex scan in 6 months as well as repeat ABIs he will continue his Plavix aspirin and statin.  Ruta Hinds, MD [facility] [place] Office: [phone]

## 2020-09-15 ENCOUNTER — Other Ambulatory Visit: Payer: Self-pay | Admitting: Vascular Surgery

## 2020-10-09 NOTE — Progress Notes (Signed)
  Radiation Oncology         [phone]) [phone] ________________________________  Name: KENSHIN SPLAWN MRN: [id number]  Date: 03/27/2020  DOB: 06/12/1946  End of Treatment Note  Diagnosis:   74 y.o. gentleman with Stage T1c adenocarcinoma of the prostate with Gleason score of 4+3, and PSA of 9.25.     Indication for treatment:  Curative, Definitive Radiotherapy       Radiation treatment dates:   02/16/20-03/27/20  Site/dose:   The prostate was treated to 70 Gy in 28 fractions of 2.5 Gy  Beams/energy:   The patient was treated with IMRT using volumetric arc therapy delivering 6 MV X-rays to clockwise and counterclockwise circumferential arcs with a 90 degree collimator offset to avoid dose scalloping.  Image guidance was performed with daily cone beam CT prior to each fraction to align to gold markers in the prostate and assure proper bladder and rectal fill volumes.  Immobilization was achieved with BodyFix custom mold.  Narrative: The patient tolerated radiation treatment relatively well.   The patient experienced some minor urinary irritation and modest fatigue.    Plan: The patient has completed radiation treatment. He will return to radiation oncology clinic for routine followup in one month. I advised him to call or return sooner if he has any questions or concerns related to his recovery or treatment. ________________________________  Sheral Apley. Tammi Klippel, M.D.

## 2020-11-01 ENCOUNTER — Other Ambulatory Visit: Payer: Self-pay

## 2020-11-01 MED ORDER — HYDROCHLOROTHIAZIDE 25 MG PO TABS: 25.0000 mg | ORAL_TABLET | Freq: Every day | ORAL | 3 refills | Status: DC

## 2020-11-02 ENCOUNTER — Other Ambulatory Visit: Payer: Self-pay

## 2020-11-02 MED ORDER — ROSUVASTATIN CALCIUM 20 MG PO TABS: 20.0000 mg | ORAL_TABLET | Freq: Every day | ORAL | 3 refills | Status: DC

## 2020-12-12 DIAGNOSIS — E7800 Pure hypercholesterolemia, unspecified: Secondary | ICD-10-CM | POA: Diagnosis not present

## 2020-12-12 DIAGNOSIS — Z125 Encounter for screening for malignant neoplasm of prostate: Secondary | ICD-10-CM | POA: Diagnosis not present

## 2020-12-12 DIAGNOSIS — E1151 Type 2 diabetes mellitus with diabetic peripheral angiopathy without gangrene: Secondary | ICD-10-CM | POA: Diagnosis not present

## 2020-12-18 ENCOUNTER — Other Ambulatory Visit: Payer: Self-pay | Admitting: Vascular Surgery

## 2020-12-18 DIAGNOSIS — E1142 Type 2 diabetes mellitus with diabetic polyneuropathy: Secondary | ICD-10-CM | POA: Diagnosis not present

## 2020-12-18 DIAGNOSIS — E785 Hyperlipidemia, unspecified: Secondary | ICD-10-CM | POA: Diagnosis not present

## 2020-12-18 DIAGNOSIS — E1165 Type 2 diabetes mellitus with hyperglycemia: Secondary | ICD-10-CM | POA: Diagnosis not present

## 2020-12-18 DIAGNOSIS — Z794 Long term (current) use of insulin: Secondary | ICD-10-CM | POA: Diagnosis not present

## 2020-12-18 DIAGNOSIS — I10 Essential (primary) hypertension: Secondary | ICD-10-CM | POA: Diagnosis not present

## 2020-12-19 DIAGNOSIS — I25810 Atherosclerosis of coronary artery bypass graft(s) without angina pectoris: Secondary | ICD-10-CM | POA: Diagnosis not present

## 2020-12-19 DIAGNOSIS — H9193 Unspecified hearing loss, bilateral: Secondary | ICD-10-CM | POA: Diagnosis not present

## 2020-12-19 DIAGNOSIS — I739 Peripheral vascular disease, unspecified: Secondary | ICD-10-CM | POA: Diagnosis not present

## 2020-12-19 DIAGNOSIS — K2270 Barrett's esophagus without dysplasia: Secondary | ICD-10-CM | POA: Diagnosis not present

## 2020-12-19 DIAGNOSIS — E7800 Pure hypercholesterolemia, unspecified: Secondary | ICD-10-CM | POA: Diagnosis not present

## 2020-12-19 DIAGNOSIS — I6529 Occlusion and stenosis of unspecified carotid artery: Secondary | ICD-10-CM | POA: Diagnosis not present

## 2020-12-19 DIAGNOSIS — R82998 Other abnormal findings in urine: Secondary | ICD-10-CM | POA: Diagnosis not present

## 2020-12-19 DIAGNOSIS — Z0000 Encounter for general adult medical examination without abnormal findings: Secondary | ICD-10-CM | POA: Diagnosis not present

## 2020-12-19 DIAGNOSIS — Z1212 Encounter for screening for malignant neoplasm of rectum: Secondary | ICD-10-CM | POA: Diagnosis not present

## 2020-12-19 DIAGNOSIS — E1151 Type 2 diabetes mellitus with diabetic peripheral angiopathy without gangrene: Secondary | ICD-10-CM | POA: Diagnosis not present

## 2020-12-19 DIAGNOSIS — I119 Hypertensive heart disease without heart failure: Secondary | ICD-10-CM | POA: Diagnosis not present

## 2020-12-19 DIAGNOSIS — C61 Malignant neoplasm of prostate: Secondary | ICD-10-CM | POA: Diagnosis not present

## 2021-01-07 DIAGNOSIS — C61 Malignant neoplasm of prostate: Secondary | ICD-10-CM | POA: Diagnosis not present

## 2021-01-14 DIAGNOSIS — C61 Malignant neoplasm of prostate: Secondary | ICD-10-CM | POA: Diagnosis not present

## 2021-03-05 ENCOUNTER — Other Ambulatory Visit: Payer: Self-pay

## 2021-03-05 DIAGNOSIS — I739 Peripheral vascular disease, unspecified: Secondary | ICD-10-CM

## 2021-03-05 DIAGNOSIS — I6523 Occlusion and stenosis of bilateral carotid arteries: Secondary | ICD-10-CM

## 2021-03-18 ENCOUNTER — Other Ambulatory Visit: Payer: Self-pay | Admitting: Vascular Surgery

## 2021-03-18 ENCOUNTER — Other Ambulatory Visit: Payer: Self-pay | Admitting: Cardiovascular Disease

## 2021-03-28 ENCOUNTER — Ambulatory Visit (INDEPENDENT_AMBULATORY_CARE_PROVIDER_SITE_OTHER)
Admission: RE | Admit: 2021-03-28 | Discharge: 2021-03-28 | Disposition: A | Discharge status: Home / Self Care | Payer: Medicare HMO | Source: Ambulatory Visit | Attending: Vascular Surgery | Admitting: Vascular Surgery

## 2021-03-28 ENCOUNTER — Encounter: Payer: Self-pay | Admitting: Vascular Surgery

## 2021-03-28 ENCOUNTER — Ambulatory Visit: Payer: Medicare HMO | Admitting: Vascular Surgery

## 2021-03-28 ENCOUNTER — Other Ambulatory Visit: Payer: Self-pay

## 2021-03-28 ENCOUNTER — Ambulatory Visit (HOSPITAL_COMMUNITY)
Admission: RE | Admit: 2021-03-28 | Discharge: 2021-03-28 | Disposition: A | Discharge status: Home / Self Care | Payer: Medicare HMO | Source: Ambulatory Visit | Attending: Vascular Surgery | Admitting: Vascular Surgery

## 2021-03-28 VITALS — BP 170/70 | HR 48 | Temp 97.7°F | Resp 20 | Ht 71.0 in | Wt 190.0 lb

## 2021-03-28 DIAGNOSIS — I6523 Occlusion and stenosis of bilateral carotid arteries: Secondary | ICD-10-CM | POA: Diagnosis not present

## 2021-03-28 DIAGNOSIS — I739 Peripheral vascular disease, unspecified: Secondary | ICD-10-CM

## 2021-03-28 NOTE — Progress Notes (Signed)
Patient is a 75 year old male who returns for follow-up today.  At his last office visit he was still recovering from recent prostate cancer therapy.  He feels he has gotten most of his strength back at this point.  He has no symptoms of TIA amaurosis or stroke.  He does not really describe any claudication symptoms.  He is satisfied with his current walking distance.  He has previously undergone left carotid stenting.  He has also had a previous right superficial femoral artery stent in the past.  Current Outpatient Medications on File Prior to Visit  Medication Sig Dispense Refill  . amLODipine-benazepril (LOTREL) 5-20 MG capsule Take 1 capsule by mouth once daily 90 capsule 3  . aspirin EC 81 MG tablet Take 81 mg by mouth daily.    . clopidogrel (PLAVIX) 75 MG tablet Take 1 tablet by mouth once daily 90 tablet 0  . hydrochlorothiazide (HYDRODIURIL) 25 MG tablet Take 1 tablet (25 mg total) by mouth daily. 90 tablet 3  . insulin NPH Human (NOVOLIN N) 100 UNIT/ML injection Inject 45 units [place] BID    . insulin regular (NOVOLIN R) 100 units/mL injection Inject up to 20 units [place] TID meals    . metFORMIN (GLUCOPHAGE-XR) 500 MG 24 hr tablet Take 500 mg by mouth 2 (two) times daily with a meal.     . metoprolol tartrate (LOPRESSOR) 50 MG tablet Take 1 tablet (50 mg total) by mouth 2 (two) times daily. 180 tablet 3  . nitroGLYCERIN (NITROSTAT) 0.4 MG SL tablet Place 1 tablet (0.4 mg total) under the tongue every 5 (five) minutes as needed for chest pain. 25 tablet 6  . omeprazole (PRILOSEC) 20 MG capsule Take 20 mg by mouth daily.    . rosuvastatin (CRESTOR) 20 MG tablet Take 1 tablet (20 mg total) by mouth daily. 90 tablet 3   No current facility-administered medications on file prior to visit.     Past Medical History:  Diagnosis Date  . Arthritis   . CAD (coronary artery disease)   . Carotid artery occlusion   . Diabetes mellitus    fasting 100-120s  . Dizziness   . Dysrhythmia    skips  beats  . GERD (gastroesophageal reflux disease)   . History of kidney stones   . Hyperlipidemia   . Hypertension   . IHD (ischemic heart disease)    Prior CABG in 2003  . Normal nuclear stress test June 2012  . Peripheral vascular disease ([place])   . Prostate cancer ([place])   . Torn rotator cuff     Past Surgical History:  Procedure Laterality Date  . ABDOMINAL AORTAGRAM N/A 12/21/2013   Procedure: ABDOMINAL Maxcine Ham;  Surgeon: Elam Dutch, MD;  Location: [facility] CATH LAB;  Service: Cardiovascular;  Laterality: N/A;  . ABDOMINAL AORTOGRAM N/A 11/13/2017   Procedure: ABDOMINAL AORTOGRAM;  Surgeon: Elam Dutch, MD;  Location: [place] CV LAB;  Service: Cardiovascular;  Laterality: N/A;  . APPENDECTOMY    . ARCH AORTOGRAM  08/26/2013   Procedure: ARCH AORTOGRAM;  Surgeon: Elam Dutch, MD;  Location: [facility] CATH LAB;  Service: Cardiovascular;;  . CARDIAC CATHETERIZATION  05/03/2002   EF 40-45%  . CARDIOVASCULAR STRESS TEST  08/10/2006   EF 65%  . CAROTID ENDARTERECTOMY    . CAROTID STENT INSERTION Left 08/26/2013   Procedure: CAROTID STENT INSERTION;  Surgeon: Elam Dutch, MD;  Location: [facility] CATH LAB;  Service: Cardiovascular;  Laterality: Left;  internal carotid  . CORONARY  ARTERY BYPASS GRAFT  04/2002  . DOPPLER ECHOCARDIOGRAPHY  08/12/2002   EF 80-85%  . ENDARTERECTOMY Left 07/20/2013   Procedure: ATTEMPTED ENDARTERECTOMY CAROTID;  Surgeon: Conrad [place], MD;  Location: [place] [facility];  Service: Vascular;  Laterality: Left;  . HERNIA REPAIR    . LOWER EXTREMITY ANGIOGRAPHY  11/13/2017   Procedure: Lower Extremity Angiography;  Surgeon: Elam Dutch, MD;  Location: [facility] CV LAB;  Service: Cardiovascular;;  . PERIPHERAL VASCULAR INTERVENTION Right 11/13/2017   Procedure: PERIPHERAL VASCULAR INTERVENTION;  Surgeon: Elam Dutch, MD;  Location: [facility] CV LAB;  Service: Cardiovascular;  Laterality: Right;  superficial femoral  . SHOULDER SURGERY      Review of  systems: He has no shortness of breath.  He has no chest pain.  Physical exam:  Vitals:   03/28/21 1055 03/28/21 1058  BP: (!) 181/73 (!) 170/70  Pulse: (!) 48   Resp: 20   Temp: 97.7 F (36.5 C)   SpO2: 95%   Weight: 190 lb (86.2 kg)   Height: 5\' 11"  (1.803 m)     Neuro: Symmetric upper extremity lower extremity motor strength 5/5   extremities: 2+ femoral pulses absent popliteal and pedal pulses bilaterally  Skin: No open ulcer or wound  Data: Patient had bilateral ABIs performed today which were 0.55 on the right 0.52 on the left.  This is a decline from June 2019 where his right side was 0.97 and his left side was 0.72 he also had a carotid duplex exam today which showed no significant stenosis of the right internal carotid artery.  There is a 50 to 75% stenosis of the mid to distal stent on the left side.  Velocities on the left side were essentially unchanged from October 2021.  Assessment: #1 peripheral arterial disease most likely has occluded his superficial femoral artery stent but currently asymptomatic with no claudication symptoms follow-up ABI in 1 year  2.  Moderate in-stent restenosis left carotid stent currently asymptomatic no change in velocities over the last 6 months.  He will continue his aspirin and Plavix.  Follow-up carotid duplex scan in 1 year  Patient will be seen in our APP clinic on his return visit.  Plan: See above  Ruta Hinds, MD [facility] [place] Office: [phone]

## 2021-04-22 ENCOUNTER — Telehealth: Payer: Self-pay | Admitting: Cardiovascular Disease

## 2021-04-22 NOTE — Telephone Encounter (Signed)
The patient has edema in his lower extremities that started about a week ago. The patient does not have a scale at home. The patient feels like he drinks more than he used too and eats too much salt. The patient can lay flat when he is sleeping. The edema seems to be going down some at night and coming back during the day. Advised patient to limit fluid and salt intake, elevate legs during the day when able, buy some compression stocking if able, and buy scale if able for daily weights (educated the importance of this). Patient has not missed any doses of HCTZ. Current BP 187/117 HR 67 left arm, BP 176/62 HR 72 right arm. Had the patient check his legs for indention, states there is a small one, goes away rapidly.  Will forward to Dr Acie Fredrickson for advisement.

## 2021-04-22 NOTE — Telephone Encounter (Signed)
Nahser, Connor Cheng, MD to Me      10:49 AM Agree with Saddie Benders, RN   He needs to reduce his salt intake  Needs to exercise regularly ,  wear compression hose   Advised patient with agreement. Patient will get compression hose today. Will give these changes 5-7 days and if things are worsening will call back.  Verbalized understanding

## 2021-04-22 NOTE — Telephone Encounter (Signed)
Pt c/o swelling: STAT is pt has developed SOB within 24 hours  If swelling, where is the swelling located? Both lower legs   How much weight have you gained and in what time span? N/a  Have you gained 3 pounds in a day or 5 pounds in a week? N/a  Do you have a log of your daily weights (if so, list)? N/a  Are you currently taking a fluid pill? Yes  Are you currently SOB? Only on exertion  Have you traveled recently? no

## 2021-04-25 DIAGNOSIS — Z7984 Long term (current) use of oral hypoglycemic drugs: Secondary | ICD-10-CM | POA: Diagnosis not present

## 2021-04-25 DIAGNOSIS — I10 Essential (primary) hypertension: Secondary | ICD-10-CM | POA: Diagnosis not present

## 2021-04-25 DIAGNOSIS — E1142 Type 2 diabetes mellitus with diabetic polyneuropathy: Secondary | ICD-10-CM | POA: Diagnosis not present

## 2021-04-25 DIAGNOSIS — Z794 Long term (current) use of insulin: Secondary | ICD-10-CM | POA: Diagnosis not present

## 2021-04-25 DIAGNOSIS — E785 Hyperlipidemia, unspecified: Secondary | ICD-10-CM | POA: Diagnosis not present

## 2021-06-15 ENCOUNTER — Emergency Department (HOSPITAL_BASED_OUTPATIENT_CLINIC_OR_DEPARTMENT_OTHER): Payer: Medicare HMO | Admitting: Radiology

## 2021-06-15 ENCOUNTER — Emergency Department (HOSPITAL_BASED_OUTPATIENT_CLINIC_OR_DEPARTMENT_OTHER)
Admission: EM | Admit: 2021-06-15 | Discharge: 2021-06-15 | Disposition: A | Discharge status: Home / Self Care | Payer: Medicare HMO | Attending: Physical Medicine & Rehabilitation | Admitting: Physical Medicine & Rehabilitation

## 2021-06-15 ENCOUNTER — Encounter (HOSPITAL_BASED_OUTPATIENT_CLINIC_OR_DEPARTMENT_OTHER): Payer: Self-pay

## 2021-06-15 DIAGNOSIS — Z87891 Personal history of nicotine dependence: Secondary | ICD-10-CM | POA: Diagnosis not present

## 2021-06-15 DIAGNOSIS — I110 Hypertensive heart disease with heart failure: Secondary | ICD-10-CM | POA: Diagnosis not present

## 2021-06-15 DIAGNOSIS — Z7984 Long term (current) use of oral hypoglycemic drugs: Secondary | ICD-10-CM | POA: Insufficient documentation

## 2021-06-15 DIAGNOSIS — Z7902 Long term (current) use of antithrombotics/antiplatelets: Secondary | ICD-10-CM | POA: Insufficient documentation

## 2021-06-15 DIAGNOSIS — I509 Heart failure, unspecified: Secondary | ICD-10-CM | POA: Diagnosis not present

## 2021-06-15 DIAGNOSIS — R0602 Shortness of breath: Secondary | ICD-10-CM

## 2021-06-15 DIAGNOSIS — Z951 Presence of aortocoronary bypass graft: Secondary | ICD-10-CM | POA: Diagnosis not present

## 2021-06-15 DIAGNOSIS — Z794 Long term (current) use of insulin: Secondary | ICD-10-CM | POA: Diagnosis not present

## 2021-06-15 DIAGNOSIS — Z7982 Long term (current) use of aspirin: Secondary | ICD-10-CM | POA: Diagnosis not present

## 2021-06-15 DIAGNOSIS — I517 Cardiomegaly: Secondary | ICD-10-CM | POA: Diagnosis not present

## 2021-06-15 DIAGNOSIS — Z79899 Other long term (current) drug therapy: Secondary | ICD-10-CM | POA: Insufficient documentation

## 2021-06-15 DIAGNOSIS — E1142 Type 2 diabetes mellitus with diabetic polyneuropathy: Secondary | ICD-10-CM | POA: Insufficient documentation

## 2021-06-15 DIAGNOSIS — I2510 Atherosclerotic heart disease of native coronary artery without angina pectoris: Secondary | ICD-10-CM | POA: Diagnosis not present

## 2021-06-15 DIAGNOSIS — M7989 Other specified soft tissue disorders: Secondary | ICD-10-CM | POA: Diagnosis not present

## 2021-06-15 LAB — COMPREHENSIVE METABOLIC PANEL
ALT: 14 U/L (ref 0–44)
AST: 16 U/L (ref 15–41)
Albumin: 3.8 g/dL (ref 3.5–5.0)
Alkaline Phosphatase: 74 U/L (ref 38–126)
Anion gap: 11 (ref 5–15)
BUN: 27 mg/dL — ABNORMAL HIGH (ref 8–23)
CO2: 24 mmol/L (ref 22–32)
Calcium: 9.5 mg/dL (ref 8.9–10.3)
Chloride: 105 mmol/L (ref 98–111)
Creatinine, Ser: 1.32 mg/dL — ABNORMAL HIGH (ref 0.61–1.24)
GFR, Estimated: 57 mL/min — ABNORMAL LOW (ref >60)
Glucose, Bld: 171 mg/dL — ABNORMAL HIGH (ref 70–99)
Potassium: 4 mmol/L (ref 3.5–5.1)
Sodium: 140 mmol/L (ref 135–145)
Total Bilirubin: 0.5 mg/dL (ref 0.3–1.2)
Total Protein: 6.8 g/dL (ref 6.5–8.1)

## 2021-06-15 LAB — CBC WITH DIFFERENTIAL/PLATELET
Abs Immature Granulocytes: 0.02 10*3/uL (ref 0.00–0.07)
Basophils Absolute: 0 10*3/uL (ref 0.0–0.1)
Basophils Relative: 1 %
Eosinophils Absolute: 0.1 10*3/uL (ref 0.0–0.5)
Eosinophils Relative: 1 %
HCT: 40.6 % (ref 39.0–52.0)
Hemoglobin: 13.1 g/dL (ref 13.0–17.0)
Immature Granulocytes: 0 %
Lymphocytes Relative: 21 %
Lymphs Abs: 1.4 10*3/uL (ref 0.7–4.0)
MCH: 29.3 pg (ref 26.0–34.0)
MCHC: 32.3 g/dL (ref 30.0–36.0)
MCV: 90.8 fL (ref 80.0–100.0)
Monocytes Absolute: 0.4 10*3/uL (ref 0.1–1.0)
Monocytes Relative: 6 %
Neutro Abs: 4.7 10*3/uL (ref 1.7–7.7)
Neutrophils Relative %: 71 %
Platelets: 189 10*3/uL (ref 150–400)
RBC: 4.47 MIL/uL (ref 4.22–5.81)
RDW: 14.6 % (ref 11.5–15.5)
WBC: 6.6 10*3/uL (ref 4.0–10.5)
nRBC: 0 % (ref 0.0–0.2)

## 2021-06-15 LAB — CBG MONITORING, ED: Glucose-Capillary: 147 mg/dL — ABNORMAL HIGH (ref 70–99)

## 2021-06-15 LAB — BRAIN NATRIURETIC PEPTIDE: B Natriuretic Peptide: 643.1 pg/mL — ABNORMAL HIGH (ref 0.0–100.0)

## 2021-06-15 LAB — TROPONIN I (HIGH SENSITIVITY)
Troponin I (High Sensitivity): 28 ng/L — ABNORMAL HIGH
Troponin I (High Sensitivity): 30 ng/L — ABNORMAL HIGH (ref <18)

## 2021-06-15 MED ORDER — POTASSIUM CHLORIDE ER 10 MEQ PO TBCR: 10.0000 meq | EXTENDED_RELEASE_TABLET | Freq: Every day | ORAL | 0 refills | Status: DC

## 2021-06-15 MED ORDER — FUROSEMIDE 40 MG PO TABS: 40.0000 mg | ORAL_TABLET | Freq: Every day | ORAL | 0 refills | Status: DC

## 2021-06-15 MED ORDER — FUROSEMIDE 10 MG/ML IJ SOLN
40.0000 mg | Freq: Once | INTRAMUSCULAR | Status: AC
  Administered 2021-06-15: 40 mg via INTRAVENOUS
  Filled 2021-06-15: qty 4

## 2021-06-15 NOTE — Discharge Instructions (Addendum)
Avoid drinking excessive amounts of fluid.  Stop taking the hydrochlorothiazide and start taking the Lasix.  You will take 1 tablet 2 times a day for the next 3 days and then daily until you see the cardiologist.  Also for the potassium you will take 1 tablet 2 times a day for the next 3 days and then daily until you see the cardiologist.  You do not need to start these medications until tomorrow.  Otherwise continue your other medications as prescribed.  If you start feeling worse, having chest pain or severe shortness of breath that is not getting better or passing out return to the emergency room.  For the next few nights you may want to sleep propped up so you do not wake up feeling short of breath.  You can also continue to wear your compression socks.

## 2021-06-15 NOTE — ED Provider Notes (Signed)
[facility] DE[facility] Provider Note   CSN: [id number] Arrival date & time: 06/15/21  [id number]     History Chief Complaint  Patient presents with   Leg Swelling    MORROW FISS is a 75 y.o. male.  Patient is a 75 year old male with a history of CAD, diabetes, GERD, hypertension, hyperlipidemia and PVD who is presenting today with complaints of bilateral lower extremity swelling for the last 1 month with gradually worsening shortness of breath and orthopnea for the first time this morning.  Patient spoke with his cardiologist 1 month ago and told him what was going on and they recommended that he wear compression socks.  He has been wearing the compression socks since that day every day he reports the swelling has not gotten any worse and is improved when wearing compression socks but he has gradually had worsening shortness of breath.  It is worse with lying down and he notices it more when he is up exerting himself.  He denies any chest pain, abdominal pain, nausea or vomiting.  He has not had any wheezing and denies any history of lung disease.  He has not had a fever and does not weigh himself regularly but feels like he may have gained some weight.  He has not changed any of his medication for quite some time.  He does take HCTZ but no other diuretics.  The history is provided by the patient and medical records.      Past Medical History:  Diagnosis Date   Arthritis    CAD (coronary artery disease)    Carotid artery occlusion    Diabetes mellitus    fasting 100-120s   Dizziness    Dysrhythmia    skips beats   GERD (gastroesophageal reflux disease)    History of kidney stones    Hyperlipidemia    Hypertension    IHD (ischemic heart disease)    Prior CABG in 2003   Normal nuclear stress test June 2012   Peripheral vascular disease ([place])    Prostate cancer [facility])    Torn rotator cuff     Patient Active Problem List   Diagnosis Date Noted   Malignant  neoplasm of prostate ([place]) 11/29/2019   Chronic multifocal osteomyelitis, right ankle and foot ([place]) 03/23/2018   Diabetic ulcer of toe of right foot associated with type 2 diabetes mellitus, with fat layer exposed ([place]) 03/05/2018   Cellulitis of extremity 11/19/2017   GERD (gastroesophageal reflux disease) 02/05/2016   Benign essential hypertension 01/28/2016   Uncontrolled type 2 diabetes mellitus with diabetic polyneuropathy, with long-term current use of insulin ([place]) 01/28/2016   Ischemic heart disease 05/19/2014   Peripheral vascular disease, unspecified ([place]) 01/12/2014   Atherosclerosis of native arteries of the extremities with ulceration(440.23) 12/08/2013   Wound drainage 07/25/2013   Carotid artery stenosis 07/15/2013   Right leg claudication ([place]) 07/08/2013   Right carotid bruit 07/08/2013   CAD (coronary artery disease) 06/19/2011   HTN (hypertension) 06/19/2011   Hyperlipidemia 06/19/2011   Atherosclerotic heart disease of native coronary artery without angina pectoris 06/19/2011    Past Surgical History:  Procedure Laterality Date   ABDOMINAL AORTAGRAM N/A 12/21/2013   Procedure: ABDOMINAL Maxcine Ham;  Surgeon: Elam Dutch, MD;  Location: [facility] CATH LAB;  Service: Cardiovascular;  Laterality: N/A;   ABDOMINAL AORTOGRAM N/A 11/13/2017   Procedure: ABDOMINAL AORTOGRAM;  Surgeon: Elam Dutch, MD;  Location: [facility] CV LAB;  Service: Cardiovascular;  Laterality: N/A;   APPENDECTOMY  ARCH AORTOGRAM  08/26/2013   Procedure: ARCH AORTOGRAM;  Surgeon: Elam Dutch, MD;  Location: [facility] CATH LAB;  Service: Cardiovascular;;   CARDIAC CATHETERIZATION  05/03/2002   EF 40-45%   CARDIOVASCULAR STRESS TEST  08/10/2006   EF 65%   CAROTID ENDARTERECTOMY     CAROTID STENT INSERTION Left 08/26/2013   Procedure: CAROTID STENT INSERTION;  Surgeon: Elam Dutch, MD;  Location: [facility] CATH LAB;  Service: Cardiovascular;  Laterality: Left;  internal carotid   CORONARY ARTERY  BYPASS GRAFT  04/2002   DOPPLER ECHOCARDIOGRAPHY  08/12/2002   EF 80-85%   ENDARTERECTOMY Left 07/20/2013   Procedure: ATTEMPTED ENDARTERECTOMY CAROTID;  Surgeon: Conrad Lapeer, MD;  Location: [facility];  Service: Vascular;  Laterality: Left;   HERNIA REPAIR     LOWER EXTREMITY ANGIOGRAPHY  11/13/2017   Procedure: Lower Extremity Angiography;  Surgeon: Elam Dutch, MD;  Location: [facility] CV LAB;  Service: Cardiovascular;;   PERIPHERAL VASCULAR INTERVENTION Right 11/13/2017   Procedure: PERIPHERAL VASCULAR INTERVENTION;  Surgeon: Elam Dutch, MD;  Location: [place] CV LAB;  Service: Cardiovascular;  Laterality: Right;  superficial femoral   SHOULDER SURGERY         Family History  Problem Relation Age of Onset   Heart attack Mother    Diabetes Mother    Heart disease Mother    Hypertension Mother    Heart attack Father    Heart disease Father    Hypertension Father    Diabetes Brother    Diabetes Sister    Diabetes Daughter    Heart disease Daughter    Hypertension Daughter    Diabetes Sister    Breast cancer Neg Hx    Prostate cancer Neg Hx    Colon cancer Neg Hx    Pancreatic cancer Neg Hx     Social History   Tobacco Use   Smoking status: Former    Packs/day: 1.00    Years: 20.00    Pack years: 20.00    Types: Cigarettes    Quit date: 10/27/2001    Years since quitting: 19.6   Smokeless tobacco: Never  Vaping Use   Vaping Use: Never used  Substance Use Topics   Alcohol use: No    Alcohol/week: 0.0 standard drinks   Drug use: No    Home Medications Prior to Admission medications   Medication Sig Start Date End Date Taking? Authorizing Provider  amLODipine-benazepril (LOTREL) 5-20 MG capsule Take 1 capsule by mouth once daily 03/18/21   Nahser, Wonda Cheng, MD  aspirin EC 81 MG tablet Take 81 mg by mouth daily.    [provider]  clopidogrel (PLAVIX) 75 MG tablet Take 1 tablet by mouth once daily 03/18/21   Angelia Mould, MD   hydrochlorothiazide (HYDRODIURIL) 25 MG tablet Take 1 tablet (25 mg total) by mouth daily. 11/01/20   Nahser, Wonda Cheng, MD  insulin NPH Human (NOVOLIN N) 100 UNIT/ML injection Inject 45 units [place] BID 09/08/19   [provider]  insulin regular (NOVOLIN R) 100 units/mL injection Inject up to 20 units [place] TID meals 09/08/19   [provider]  metFORMIN (GLUCOPHAGE-XR) 500 MG 24 hr tablet Take 500 mg by mouth 2 (two) times daily with a meal.  06/16/13   [provider]  metoprolol tartrate (LOPRESSOR) 50 MG tablet Take 1 tablet (50 mg total) by mouth 2 (two) times daily. 09/11/20   Nahser, Wonda Cheng, MD  nitroGLYCERIN (NITROSTAT) 0.4 MG SL  tablet Place 1 tablet (0.4 mg total) under the tongue every 5 (five) minutes as needed for chest pain. 08/31/20   Nahser, Wonda Cheng, MD  omeprazole (PRILOSEC) 20 MG capsule Take 20 mg by mouth daily.    [provider]  rosuvastatin (CRESTOR) 20 MG tablet Take 1 tablet (20 mg total) by mouth daily. 11/02/20   Nahser, Wonda Cheng, MD    Allergies    Lipitor [atorvastatin calcium], Sulfa drugs cross reactors, and Codeine  Review of Systems   Review of Systems  All other systems reviewed and are negative.  Physical Exam Updated Vital Signs BP (!) 155/48 (BP Location: Right Arm)   Pulse (!) 47   Temp 98.2 F (36.8 C) (Oral)   Resp 16   Ht '5\' 11"'$  (1.803 m)   Wt 86.2 kg   SpO2 98%   BMI 26.50 kg/m   Physical Exam Vitals and nursing note reviewed.  Constitutional:      General: He is not in acute distress.    Appearance: He is well-developed.  HENT:     Head: Normocephalic and atraumatic.     Mouth/Throat:     Mouth: Mucous membranes are moist.  Eyes:     Conjunctiva/sclera: Conjunctivae normal.     Pupils: Pupils are equal, round, and reactive to light.  Cardiovascular:     Rate and Rhythm: Normal rate and regular rhythm.     Pulses: Normal pulses.     Heart sounds: No murmur heard. Pulmonary:     Effort: Pulmonary  effort is normal. No respiratory distress.     Breath sounds: Normal breath sounds. No wheezing or rales.     Comments: Well-healed midline sternotomy scar Chest:     Chest wall: No tenderness.  Abdominal:     General: There is no distension.     Palpations: Abdomen is soft.     Tenderness: There is no abdominal tenderness. There is no guarding or rebound.  Musculoskeletal:        General: No tenderness. Normal range of motion.     Cervical back: Normal range of motion and neck supple.     Right lower leg: Edema present.     Left lower leg: Edema present.     Comments: 1+ pitting edema in the feet and ankles bilaterally  Skin:    General: Skin is warm and dry.     Findings: No erythema or rash.  Neurological:     Mental Status: He is alert and oriented to person, place, and time. Mental status is at baseline.  Psychiatric:        Mood and Affect: Mood normal.        Behavior: Behavior normal.    ED Results / Procedures / Treatments   Labs (all labs ordered are listed, but only abnormal results are displayed) Labs Reviewed  COMPREHENSIVE METABOLIC PANEL - Abnormal; Notable for the following components:      Result Value   Glucose, Bld 171 (*)    BUN 27 (*)    Creatinine, Ser 1.32 (*)    GFR, Estimated 57 (*)    All other components within normal limits  BRAIN NATRIURETIC PEPTIDE - Abnormal; Notable for the following components:   B Natriuretic Peptide 643.1 (*)    All other components within normal limits  TROPONIN I (HIGH SENSITIVITY) - Abnormal; Notable for the following components:   Troponin I (High Sensitivity) 28 (*)    All other components within normal limits  CBC  WITH DIFFERENTIAL/PLATELET  TROPONIN I (HIGH SENSITIVITY)    EKG EKG Interpretation  Date/Time:  Saturday June 15 2021 10:16:32 EDT Ventricular Rate:  47 PR Interval:  184 QRS Duration: 106 QT Interval:  450 QTC Calculation: 398 R Axis:   60 Text Interpretation: Sinus bradycardia Inferior  infarct , age undetermined Possible Anterior infarct , age undetermined new ST & T wave abnormality, consider lateral ischemia from 04/2011 Confirmed by Blanchie Dessert [phone]) on 06/15/2021 10:25:54 AM  Radiology DG Chest Port 1 View  Result Date: 06/15/2021 CLINICAL DATA:  Shortness of breath EXAM: PORTABLE CHEST 1 VIEW COMPARISON:  07/19/2013 FINDINGS: Midline trachea. Borderline cardiomegaly. Prior median sternotomy. No pleural effusion or pneumothorax. Diffuse peribronchial thickening. No lobar consolidation. IMPRESSION: 1. No acute cardiopulmonary disease. 2. Mild peribronchial thickening which may relate to chronic bronchitis or smoking. Electronically Signed   By: Abigail Miyamoto M.D.   On: 06/15/2021 11:10    Procedures Procedures   Medications Ordered in ED Medications - No data to display  ED Course  I have reviewed the triage vital signs and the nursing notes.  Pertinent labs & imaging results that were available during my care of the patient were reviewed by me and considered in my medical decision making (see chart for details).    MDM Rules/Calculators/A&P                           Elderly male presenting today with complaints of shortness of breath and leg swelling.  This has been gradually getting worse over the last month.  Swelling does improve with compression socks.  No leg pain and symptoms are not unilateral.  He denies any infectious symptoms but today had orthopnea for the first time.  Concern for CHF given his cardiac history and description of symptoms.  Low suspicion for pneumonia at this time.  Patient's EKG is changed from 2012 where there is now ST depression and T wave inversion in inferior lateral leads.  He is well-appearing at this time and low suspicion for ACS.  Symptoms are not classic for PE or DVT.  Labs and imaging are pending.  12:41 PM Patient's labs today are consistent with CHF with a BNP of 670.  Renal function of 1.3 which is not significantly  changed from 4 years ago, chest x-ray with chronic changes but no evidence of significant pleural effusion.  Patient has been fairly bradycardic here between high 30s and mid 75s which family reports that seems unusual.  He denies any presyncopal symptoms at this time.  Feel that patient will need diuresis.  Will discuss with his cardiologist.  Patient has not had an echo in quite some time.  2:06 PM Spoke with Dr. Acie Fredrickson who recommended that we do '40mg'$  lasix bid for 3 days and '10mg'$  potassium bid for 3 days and then decrease to daily until seen in the office and he can have outpt echo.  Findings discussed with pt and his family member and they are comfortable with this plan.  Pt given IV lasix here.  MDM   Amount and/or Complexity of Data Reviewed Clinical lab tests: ordered and reviewed Tests in the radiology section of CPT: ordered and reviewed Tests in the medicine section of CPT: ordered and reviewed Independent visualization of images, tracings, or specimens: yes  Risk of Complications, Morbidity, and/or Mortality Presenting problems: moderate Diagnostic procedures: moderate Management options: moderate  Patient Progress Patient progress: stable    Final Clinical  Impression(s) / ED Diagnoses Final diagnoses:  Acute congestive heart failure, unspecified heart failure type [facility])    Rx / DC Orders ED Discharge Orders          Ordered    furosemide (LASIX) 40 MG tablet  Daily        06/15/21 1409    potassium chloride (KLOR-CON) 10 MEQ tablet  Daily        06/15/21 1409             Blanchie Dessert, MD 06/15/21 1409

## 2021-06-15 NOTE — ED Triage Notes (Signed)
He c/o bilat. L.e. "swelling for weeks". He states he was recently advised by his provider to employ compression stockings, which he has done with no relief. He is in no distress.

## 2021-06-15 NOTE — ED Notes (Signed)
Pt d chome with daughter/ stated understanding of dc instructions

## 2021-06-15 NOTE — ED Notes (Signed)
Pt void 500 cc clear yellow urine. Diet Sprite and chicken noodle soup /crackers and applesauce given.

## 2021-06-17 ENCOUNTER — Telehealth: Payer: Self-pay

## 2021-06-17 ENCOUNTER — Other Ambulatory Visit: Payer: Self-pay | Admitting: Vascular Surgery

## 2021-06-17 DIAGNOSIS — I2510 Atherosclerotic heart disease of native coronary artery without angina pectoris: Secondary | ICD-10-CM

## 2021-06-17 DIAGNOSIS — I10 Essential (primary) hypertension: Secondary | ICD-10-CM

## 2021-06-17 NOTE — Telephone Encounter (Signed)
Spoke with the pt and he has his Echo for 07/03/21 and follow up with Dr. Acie Fredrickson 07/08/21. He has enough medicine to last until his appt and will call if he develops any problems prior to his appt.

## 2021-06-17 NOTE — Telephone Encounter (Signed)
Spoke with the pts wife and she asked to take a message for the pt to call us back since he is out of the house... she says he is feeling very well and improving since his med changes... I advised her that I will place the order for the Echo and will wait for the pt to call back.   Pt has appt 08/26/21 but will try to make sooner after the pt calls back.

## 2021-06-17 NOTE — Telephone Encounter (Signed)
Follow up.

## 2021-06-17 NOTE — Telephone Encounter (Signed)
-----   Message from Thayer Headings, MD sent at 06/15/2021  1:50 PM EDT ----- [name] presented to the [facility] ER with a week of dyspea , chf symptoms  No recent echo I have discussed with the ER doctor, Dr. Maryan Rued. We will start : Lasix 40 mg PO BID for 3 days, then daily until he is seen by [place] Kdur 10 meq PO BID for 3 days , then daily until he is seen Please order an echo to assess LV function and valvular function   Will need an office visit with me or APP with a BMP in 3-4 weeks   Thanks  Abbe [place]

## 2021-06-17 NOTE — Telephone Encounter (Signed)
Follow up:      Patient returning a call back to the nurse.

## 2021-06-19 DIAGNOSIS — I739 Peripheral vascular disease, unspecified: Secondary | ICD-10-CM | POA: Diagnosis not present

## 2021-06-19 DIAGNOSIS — I6529 Occlusion and stenosis of unspecified carotid artery: Secondary | ICD-10-CM | POA: Diagnosis not present

## 2021-06-19 DIAGNOSIS — C61 Malignant neoplasm of prostate: Secondary | ICD-10-CM | POA: Diagnosis not present

## 2021-06-19 DIAGNOSIS — H9193 Unspecified hearing loss, bilateral: Secondary | ICD-10-CM | POA: Diagnosis not present

## 2021-06-19 DIAGNOSIS — K2270 Barrett's esophagus without dysplasia: Secondary | ICD-10-CM | POA: Diagnosis not present

## 2021-06-19 DIAGNOSIS — I119 Hypertensive heart disease without heart failure: Secondary | ICD-10-CM | POA: Diagnosis not present

## 2021-06-19 DIAGNOSIS — I509 Heart failure, unspecified: Secondary | ICD-10-CM | POA: Diagnosis not present

## 2021-06-19 DIAGNOSIS — I25810 Atherosclerosis of coronary artery bypass graft(s) without angina pectoris: Secondary | ICD-10-CM | POA: Diagnosis not present

## 2021-06-19 DIAGNOSIS — E7800 Pure hypercholesterolemia, unspecified: Secondary | ICD-10-CM | POA: Diagnosis not present

## 2021-06-19 DIAGNOSIS — R0609 Other forms of dyspnea: Secondary | ICD-10-CM | POA: Diagnosis not present

## 2021-06-19 DIAGNOSIS — E1151 Type 2 diabetes mellitus with diabetic peripheral angiopathy without gangrene: Secondary | ICD-10-CM | POA: Diagnosis not present

## 2021-07-02 DIAGNOSIS — R0609 Other forms of dyspnea: Secondary | ICD-10-CM | POA: Diagnosis not present

## 2021-07-02 DIAGNOSIS — R0981 Nasal congestion: Secondary | ICD-10-CM | POA: Diagnosis not present

## 2021-07-02 DIAGNOSIS — Z1152 Encounter for screening for COVID-19: Secondary | ICD-10-CM | POA: Diagnosis not present

## 2021-07-02 DIAGNOSIS — R5383 Other fatigue: Secondary | ICD-10-CM | POA: Diagnosis not present

## 2021-07-02 DIAGNOSIS — I509 Heart failure, unspecified: Secondary | ICD-10-CM | POA: Diagnosis not present

## 2021-07-02 DIAGNOSIS — R0602 Shortness of breath: Secondary | ICD-10-CM | POA: Diagnosis not present

## 2021-07-02 DIAGNOSIS — R059 Cough, unspecified: Secondary | ICD-10-CM | POA: Diagnosis not present

## 2021-07-03 ENCOUNTER — Other Ambulatory Visit: Payer: Self-pay

## 2021-07-03 ENCOUNTER — Ambulatory Visit (HOSPITAL_COMMUNITY): Payer: Medicare HMO | Attending: Cardiovascular Disease

## 2021-07-03 DIAGNOSIS — I10 Essential (primary) hypertension: Secondary | ICD-10-CM | POA: Insufficient documentation

## 2021-07-03 DIAGNOSIS — I2510 Atherosclerotic heart disease of native coronary artery without angina pectoris: Secondary | ICD-10-CM | POA: Insufficient documentation

## 2021-07-03 LAB — ECHOCARDIOGRAM COMPLETE
AR max vel: 1.63 cm2
AV Area VTI: 1.8 cm2
AV Area mean vel: 1.92 cm2
AV Mean grad: 9 mmHg
AV Peak grad: 24.6 mmHg
Ao pk vel: 2.48 m/s
Area-P 1/2: 3.48 cm2
P 1/2 time: 613 msec
S' Lateral: 3.1 cm

## 2021-07-07 ENCOUNTER — Encounter: Payer: Self-pay | Admitting: Cardiovascular Disease

## 2021-07-07 NOTE — Progress Notes (Signed)
Cardiology Office Note   Date:  07/08/2021   ID:  [name], [name] 01/26/1946, MRN [id number]  PCP:  Haywood Pao, MD  Cardiologist: previously Darlin Coco MD , now Mertie Moores, MD   Chief Complaint  Patient presents with   Coronary Artery Disease        Hyperlipidemia   Problem list 1. Coronary artery disease-status post CABG 2003 2. Hyperlipidemia 3. Carotid artery disease-status post left carotid stenting , s/p Right CEA  4. Essential hypertension 5. Diabetes mellitus     [name] is a 75 y.o. male who presents for a six-month follow-up visit  Notes from Dr. Mare Ferrari: . He has a past history of coronary artery disease and had coronary artery bypass graft surgery in 2003. He has a remote history of a right carotid endarterectomy about 18 years ago.  He had a left carotid endarterectomy by Dr. Oneida Alar in 2014. He has a history of high blood pressure, diabetes mellitus, and hyperlipidemia. He had a normal nuclear stress test in June 2012 prior to rotator cuff surgery.  The patient developed claudication of his right leg  and underwent successful vascular surgery in February 2015.   Since last visit he has been doing well with no chest pain or shortness of breath. Since last visit he has retired and has been enjoying retirement.  He is no longer experiencing any right calf claudication.  He has diabetes followed by Dr. Tamala Julian at [facility].  His most recent A1c was 7.2 Since last visit the patient has had no new cardiac symptoms.   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  He himself lives in a townhouse.    Aug. 18, 2017:  Mr. Burggraf is seen today for the first time. He's a previous patient of Dr. Mare Ferrari. He is followed for coronary artery disease, hyperlipidemia, carotid artery disease, hypertension.  Doing well.   Stays active.   Does lots of woodworking . Walks often   No angina    Feb. 14, 2018; Doing well.  No CP or dyspnea. Not exercising much ,  Avoids salt.   Oct. 1, 2018:  Here for eval of HTN. Still eating some salty food . Avoids hot dogs now .  stopped smoking years ago. As active as he can be,  Has a heel spur  February 23, 2018:  BP hyas been normal blood pressure is a bit high Is avoiding hot dogs  No CP or dyspnea.  Has a left carotid stent, s/p R CEA   August 24, 2018: [name]  is seen today for follow-up of his coronary artery disease, hypertension and carotid artery disease. Staying active,  Works in the garden,  Works in his woodworking shop  September 06, 2020: [name] is seen today for a follow-up of his coronary artery disease, coronary artery bypass grafting and carotid artery disease. Has prostate cancer,  Getting XRT.  Still doing some wood working.   Staying active  Has not felt well.   Sept. 12. 2022: [name] is seen today for follow up of his CAD, , CABG, carotid artery disease, HLd He was recently seen in the ER on Aug. 20, 2022  for leg swelling  He was given Lasix 40 mg BID for 3 days and then 40 mg a day Also was started on /kdur 10 meq BID x 3 days, followed by  Kdur 10 meq  a day  Had lots of abdominal swelling  Leg swelling is better.   Is wearing compression hose.   We will draw basic metabolic profile today to assess his renal function and potassium after being on Lasix and potassium for several weeks.  His daughter typed out a sheet of questions for me to answer. I informed her that he has class II-III congestive heart failure.  His ejection fraction is 60 to 65%.  He has grade 2 diastolic dysfunction. Was also found to have a old inferior wall myocardial infarction that likely occurred before his CABG.  Concerning when the family should call, if he gains 3 pounds in the course of 1 day or 5 pounds in the course of the week they should call to get further advice.  He will continue to wear his compression hose daily  if possible.  If not daily then he should wear them as much as possible.  He is leg edema is gone and I do not think there is any role and drawn at brain natruretic peptide at this time.  He has some underlying renal insufficiency so a BNP is not going to be particularly useful.  He will return to see me in 3 months with a basic metabolic profile    Past Medical History:  Diagnosis Date   Arthritis    CAD (coronary artery disease)    Carotid artery occlusion    Diabetes mellitus    fasting 100-120s   Dizziness    Dysrhythmia    skips beats   GERD (gastroesophageal reflux disease)    History of kidney stones    Hyperlipidemia    Hypertension    IHD (ischemic heart disease)    Prior CABG in 2003   Normal nuclear stress test June 2012   Peripheral vascular disease ([place])    Prostate cancer ([place])    Torn rotator cuff     Past Surgical History:  Procedure Laterality Date   ABDOMINAL AORTAGRAM N/A 12/21/2013   Procedure: ABDOMINAL Maxcine Ham;  Surgeon: Elam Dutch, MD;  Location: [facility] CATH LAB;  Service: Cardiovascular;  Laterality: N/A;   ABDOMINAL AORTOGRAM N/A 11/13/2017   Procedure: ABDOMINAL AORTOGRAM;  Surgeon: Elam Dutch, MD;  Location: [place] [facility] CV LAB;  Service: Cardiovascular;  Laterality: N/A;   APPENDECTOMY     ARCH AORTOGRAM  08/26/2013   Procedure: ARCH AORTOGRAM;  Surgeon: Elam Dutch, MD;  Location: [facility] CATH LAB;  Service: Cardiovascular;;   CARDIAC CATHETERIZATION  05/03/2002   EF 40-45%   CARDIOVASCULAR STRESS TEST  08/10/2006   EF 65%   CAROTID ENDARTERECTOMY     CAROTID STENT INSERTION Left 08/26/2013   Procedure: CAROTID STENT INSERTION;  Surgeon: Elam Dutch, MD;  Location: [facility] CATH LAB;  Service: Cardiovascular;  Laterality: Left;  internal carotid   CORONARY ARTERY BYPASS GRAFT  04/2002   DOPPLER ECHOCARDIOGRAPHY  08/12/2002   EF 80-85%   ENDARTERECTOMY Left 07/20/2013   Procedure: ATTEMPTED ENDARTERECTOMY CAROTID;  Surgeon: Conrad West Sullivan, MD;  Location: [place];  Service: Vascular;  Laterality: Left;   HERNIA REPAIR     LOWER EXTREMITY ANGIOGRAPHY  11/13/2017   Procedure: Lower Extremity Angiography;  Surgeon: Elam Dutch, MD;  Location: [place] CV LAB;  Service: Cardiovascular;;   PERIPHERAL VASCULAR INTERVENTION Right 11/13/2017   Procedure: PERIPHERAL VASCULAR INTERVENTION;  Surgeon: Elam Dutch, MD;  Location: [place] CV LAB;  Service: Cardiovascular;  Laterality: Right;  superficial femoral   SHOULDER  SURGERY       Current Outpatient Medications  Medication Sig Dispense Refill   albuterol (VENTOLIN HFA) 108 (90 Base) MCG/ACT inhaler Inhale 1 puff into the lungs as directed.     amLODipine-benazepril (LOTREL) 5-20 MG capsule Take 1 capsule by mouth once daily 90 capsule 3   aspirin EC 81 MG tablet Take 81 mg by mouth daily.     famotidine (PEPCID) 20 MG tablet Take 1 tablet (20 mg total) by mouth 2 (two) times daily. 180 tablet 3   insulin NPH Human (NOVOLIN N) 100 UNIT/ML injection Inject 45 units [place] BID     insulin regular (NOVOLIN R) 100 units/mL injection Inject up to 20 units [place] TID meals     metFORMIN (GLUCOPHAGE-XR) 500 MG 24 hr tablet Take 500 mg by mouth 2 (two) times daily with a meal.      metoprolol tartrate (LOPRESSOR) 25 MG tablet Take 1 tablet (25 mg total) by mouth 2 (two) times daily. 180 tablet 3   nitroGLYCERIN (NITROSTAT) 0.4 MG SL tablet Place 1 tablet (0.4 mg total) under the tongue every 5 (five) minutes as needed for chest pain. 25 tablet 6   rosuvastatin (CRESTOR) 20 MG tablet Take 1 tablet (20 mg total) by mouth daily. 90 tablet 3   clopidogrel (PLAVIX) 75 MG tablet Take 1 tablet (75 mg total) by mouth daily. 90 tablet 3   furosemide (LASIX) 40 MG tablet Take 1 tablet (40 mg total) by mouth daily. 90 tablet 3   potassium chloride (KLOR-CON) 10 MEQ tablet Take 1 tablet (10 mEq total) by mouth daily. Take 1 tablet ('10mg'$ ) 2 times a day for the next 3 days and then daily 90  tablet 3   No current facility-administered medications for this visit.    Allergies:   Lipitor [atorvastatin calcium], Sulfa drugs cross reactors, and Codeine    Social History:  The patient  reports that he quit smoking about 19 years ago. His smoking use included cigarettes. He has a 20.00 pack-year smoking history. He has never used smokeless tobacco. He reports that he does not drink alcohol and does not use drugs.   Family History:  The patient's family history includes Diabetes in his brother, daughter, mother, sister, and sister; Heart attack in his father and mother; Heart disease in his daughter, father, and mother; Hypertension in his daughter, father, and mother.    ROS:  Please see the history of present illness.   Otherwise, review of systems are positive for none.   All other systems are reviewed and negative.   Physical Exam: Blood pressure (!) 150/60, pulse 61, height '5\' 11"'$  (1.803 m), weight 184 lb 9.6 oz (83.7 kg), SpO2 96 %.  GEN: Chronically ill gentleman, no acute distress HEENT: Normal NECK: No JVD; soft left carotid bruit. LYMPHATICS: No lymphadenopathy CARDIAC: Regular rate S1-S2.  Soft systolic murmur. RESPIRATORY:  Clear to auscultation without rales, wheezing or rhonchi  ABDOMEN: Soft soft and nontender.  Mild distention. MUSCULOSKELETAL: He has compression hose on.  There is no edema. SKIN: Warm and dry NEUROLOGIC:  Alert and oriented x 3   EKG:      Recent Labs: 06/15/2021: ALT 14; B Natriuretic Peptide 643.1; BUN 27; Creatinine, Ser 1.32; Hemoglobin 13.1; Platelets 189; Potassium 4.0; Sodium 140    Lipid Panel    Component Value Date/Time   CHOL 132 08/24/2018 0825   TRIG 149 08/24/2018 0825   HDL 34 (L) 08/24/2018 0825   CHOLHDL 3.9 08/24/2018 0825  CHOLHDL 3.2 06/13/2016 0918   VLDL 21 06/13/2016 0918   LDLCALC 68 08/24/2018 0825    Wt Readings from Last 3 Encounters:  07/08/21 184 lb 9.6 oz (83.7 kg)  06/15/21 190 lb (86.2 kg)   03/28/21 190 lb (86.2 kg)      ASSESSMENT AND PLAN:  1.   CAD :     S/p CABG. he denies any angina.  Echo shows evidence of a previous inferior wall myocardial infarction.  2. diabetes mellitus insulin-      3.  Acute on chronic diastolic congestive heart failure: Patient has well-preserved left ventricular systolic function.  He is developed grade 2 diastolic dysfunction.  He is done very well on the Lasix and potassium.  I have given him instructions to give [place] a call if he gains 3 pounds in a day or 5 pounds in a week.  I told him we would likely double his Lasix and potassium for several days and then get him back on the same dose.  4. Hyperlipidemia.   -Followed by his primary medical doctor.  6. Bilateral carotid artery disease:   -    Current medicines are reviewed at length with the patient today.  The patient does not have concerns regarding medicines.  The following changes have been made:  no change  Labs/ tests ordered today include:   Orders Placed This Encounter  Procedures   Basic Metabolic Panel (BMET)       Mertie Moores, MD  07/08/2021 5:27 PM    [facility] [place],  [place], [place]  [address] Pager [phone] Phone: [phone]; Fax: [phone]

## 2021-07-08 ENCOUNTER — Encounter: Payer: Self-pay | Admitting: Cardiovascular Disease

## 2021-07-08 ENCOUNTER — Ambulatory Visit: Payer: Medicare HMO | Admitting: Cardiovascular Disease

## 2021-07-08 ENCOUNTER — Other Ambulatory Visit: Payer: Self-pay

## 2021-07-08 VITALS — BP 150/60 | HR 61 | Ht 71.0 in | Wt 184.6 lb

## 2021-07-08 DIAGNOSIS — E785 Hyperlipidemia, unspecified: Secondary | ICD-10-CM | POA: Diagnosis not present

## 2021-07-08 DIAGNOSIS — I10 Essential (primary) hypertension: Secondary | ICD-10-CM | POA: Diagnosis not present

## 2021-07-08 DIAGNOSIS — I5042 Chronic combined systolic (congestive) and diastolic (congestive) heart failure: Secondary | ICD-10-CM | POA: Diagnosis not present

## 2021-07-08 DIAGNOSIS — I6523 Occlusion and stenosis of bilateral carotid arteries: Secondary | ICD-10-CM | POA: Diagnosis not present

## 2021-07-08 DIAGNOSIS — I2510 Atherosclerotic heart disease of native coronary artery without angina pectoris: Secondary | ICD-10-CM

## 2021-07-08 DIAGNOSIS — I5033 Acute on chronic diastolic (congestive) heart failure: Secondary | ICD-10-CM | POA: Insufficient documentation

## 2021-07-08 MED ORDER — FUROSEMIDE 40 MG PO TABS: 40.0000 mg | ORAL_TABLET | Freq: Every day | ORAL | 3 refills | Status: DC

## 2021-07-08 MED ORDER — FAMOTIDINE 20 MG PO TABS: 20.0000 mg | ORAL_TABLET | Freq: Two times a day (BID) | ORAL | 3 refills | Status: DC

## 2021-07-08 MED ORDER — CLOPIDOGREL BISULFATE 75 MG PO TABS: 75.0000 mg | ORAL_TABLET | Freq: Every day | ORAL | 3 refills | Status: DC

## 2021-07-08 MED ORDER — METOPROLOL TARTRATE 25 MG PO TABS: 25.0000 mg | ORAL_TABLET | Freq: Two times a day (BID) | ORAL | 3 refills | Status: DC

## 2021-07-08 MED ORDER — POTASSIUM CHLORIDE ER 10 MEQ PO TBCR: 10.0000 meq | EXTENDED_RELEASE_TABLET | Freq: Every day | ORAL | 3 refills | Status: DC

## 2021-07-08 NOTE — Patient Instructions (Signed)
Medication Instructions:  Your physician has recommended you make the following change in your medication:  DECREASE your metoprolol tartrate (Lopressor) to 25 mg twice daily STOP Omeprazole (Prilosec) START Pepcid (Famotidine) 20 mg 2 times per day *If you need a refill on your cardiac medications before your next appointment, please call your pharmacy*   Lab Work: TODAY - BMET If you have labs (blood work) drawn today and your tests are completely normal, you will receive your results only by: [place] (if you have MyChart) OR A paper copy in the mail If you have any lab test that is abnormal or we need to change your treatment, we will call you to review the results.   Testing/Procedures: None Ordered   Follow-Up: At [facility], you and your health needs are our priority.  As part of our continuing mission to provide you with exceptional heart care, we have created designated Provider Care Teams.  These Care Teams include your primary Cardiologist (physician) and Advanced Practice Providers (APPs -  Physician Assistants and Nurse Practitioners) who all work together to provide you with the care you need, when you need it.  We recommend signing up for the patient portal called "MyChart".  Sign up information is provided on this After Visit Summary.  MyChart is used to connect with patients for Virtual Visits (Telemedicine).  Patients are able to view lab/test results, encounter notes, upcoming appointments, etc.  Non-urgent messages can be sent to your provider as well.   To learn more about what you can do with MyChart, go to [email or web address].    Your next appointment:   3 month(s) on Monday Dec. 5 at 10:20 am  The format for your next appointment:   In Person  Provider:   Mertie Moores, MD

## 2021-07-09 ENCOUNTER — Telehealth: Payer: Self-pay

## 2021-07-09 DIAGNOSIS — E876 Hypokalemia: Secondary | ICD-10-CM

## 2021-07-09 DIAGNOSIS — Z8546 Personal history of malignant neoplasm of prostate: Secondary | ICD-10-CM | POA: Diagnosis not present

## 2021-07-09 LAB — BASIC METABOLIC PANEL
BUN/Creatinine Ratio: 15 (ref 10–24)
BUN: 17 mg/dL (ref 8–27)
CO2: 22 mmol/L (ref 20–29)
Calcium: 9.1 mg/dL (ref 8.6–10.2)
Chloride: 103 mmol/L (ref 96–106)
Creatinine, Ser: 1.11 mg/dL (ref 0.76–1.27)
Glucose: 158 mg/dL — ABNORMAL HIGH (ref 65–99)
Potassium: 3 mmol/L — ABNORMAL LOW (ref 3.5–5.2)
Sodium: 142 mmol/L (ref 134–144)
eGFR: 70 mL/min/{1.73_m2} (ref >59)

## 2021-07-09 NOTE — Telephone Encounter (Signed)
LM for the pt to call back to go over his meds re: his Low K from yesterday 3.0.

## 2021-07-09 NOTE — Telephone Encounter (Signed)
Patient is returning call.  °

## 2021-07-09 NOTE — Telephone Encounter (Signed)
Spoke with the pt and per protocol.. pt advised to take an extra K today and tomorrow an he will return Friday 07/12/21 for repeat labs... pt has been taking his 10 meq daily.   Will forward to Dr. Acie Fredrickson for review and any further recommendations.

## 2021-07-10 MED ORDER — POTASSIUM CHLORIDE ER 10 MEQ PO TBCR: 20.0000 meq | EXTENDED_RELEASE_TABLET | Freq: Every day | ORAL | 3 refills | Status: DC

## 2021-07-10 NOTE — Telephone Encounter (Signed)
Called patient and reviewed advice from Dr. Acie Fredrickson. Patient advised to take 40 mEq of potassium chloride today and then increase daily dose to 20 mEq. I advised that I am sending refills of the medication to his pharmacy due to the increased dose. His lab appointment was rescheduled to 9/28 per Dr. Elmarie Shiley order. Patient verbalized understanding and agreement with plan of care and I advised him to call back in the future with additional questions or concerns. He thanked me for the call.

## 2021-07-12 ENCOUNTER — Other Ambulatory Visit: Payer: Medicare HMO

## 2021-07-16 DIAGNOSIS — Z8546 Personal history of malignant neoplasm of prostate: Secondary | ICD-10-CM | POA: Diagnosis not present

## 2021-07-16 DIAGNOSIS — N5201 Erectile dysfunction due to arterial insufficiency: Secondary | ICD-10-CM | POA: Diagnosis not present

## 2021-07-24 ENCOUNTER — Other Ambulatory Visit: Payer: Medicare HMO | Admitting: *Deleted

## 2021-07-24 ENCOUNTER — Other Ambulatory Visit: Payer: Self-pay

## 2021-07-24 DIAGNOSIS — E876 Hypokalemia: Secondary | ICD-10-CM | POA: Diagnosis not present

## 2021-07-24 LAB — BASIC METABOLIC PANEL
BUN/Creatinine Ratio: 13 (ref 10–24)
BUN: 15 mg/dL (ref 8–27)
CO2: 21 mmol/L (ref 20–29)
Calcium: 9.1 mg/dL (ref 8.6–10.2)
Chloride: 105 mmol/L (ref 96–106)
Creatinine, Ser: 1.19 mg/dL (ref 0.76–1.27)
Glucose: 96 mg/dL (ref 70–99)
Potassium: 3.7 mmol/L (ref 3.5–5.2)
Sodium: 142 mmol/L (ref 134–144)
eGFR: 64 mL/min/{1.73_m2} (ref >59)

## 2021-07-25 ENCOUNTER — Telehealth: Payer: Self-pay

## 2021-07-25 NOTE — Telephone Encounter (Signed)
-----   Message from Thayer Headings, MD sent at 07/25/2021  8:01 AM EDT ----- Labs are stable

## 2021-07-25 NOTE — Telephone Encounter (Signed)
Spoke with pt and advised he should continue on current dose of 54mq - 1 tablet by mouth twice daily as prescribed.  Pt verbalizes understanding and agrees with current plan.  _____________________________________________________________________________________   Looks as if Potassium is well controlled on patients current regimen:  20 meq PO daily.  Would continue this regimen (I.e. the regimen he has been taking).   Thanks,  MAC

## 2021-07-25 NOTE — Telephone Encounter (Signed)
Spoke with pt and advised per Dr Acie Fredrickson labs are stable.  Pt requesting clarification about how he should now be taking Potassium supplement.  Will forward to DOD for advisement.as Dr Acie Fredrickson is out of the office this week.  Pt thanked Therapist, sports for the call.

## 2021-07-30 ENCOUNTER — Other Ambulatory Visit: Payer: Self-pay | Admitting: Cardiovascular Disease

## 2021-08-02 DIAGNOSIS — E113293 Type 2 diabetes mellitus with mild nonproliferative diabetic retinopathy without macular edema, bilateral: Secondary | ICD-10-CM | POA: Diagnosis not present

## 2021-08-03 DIAGNOSIS — Z23 Encounter for immunization: Secondary | ICD-10-CM | POA: Diagnosis not present

## 2021-08-08 ENCOUNTER — Telehealth: Payer: Self-pay | Admitting: Cardiovascular Disease

## 2021-08-08 DIAGNOSIS — I10 Essential (primary) hypertension: Secondary | ICD-10-CM

## 2021-08-08 MED ORDER — AMLODIPINE BESY-BENAZEPRIL HCL 5-20 MG PO CAPS: 2.0000 | ORAL_CAPSULE | Freq: Every day | ORAL | 3 refills | Status: DC

## 2021-08-08 NOTE — Telephone Encounter (Signed)
Called pt and reviewed pharmacist recommendations. He is agreeable to plan.  Orders placed and f/u appointments scheduled for 08/22/21. Pt had no questions or concerns.

## 2021-08-08 NOTE — Telephone Encounter (Signed)
Pt c/o BP issue: STAT if pt c/o blurred vision, one-sided weakness or slurred speech  1. What are your last 5 BP readings? 170/60, 186/69- he could only remember these 2 readings= he says it been extremely high for him  2. Are you having any other symptoms (ex. Dizziness, headache, blurred vision, passed out)?  a little short of breath- I could hear this over the phone 3. What is your BP issue? High blood pressure- his medicine  was changed about a month ago- he said Dr Acie Fredrickson told him to call if he had any problems to calla

## 2021-08-08 NOTE — Telephone Encounter (Signed)
Please have patient increase his Lotrel. Please have him take 2 tablets daily (total of amlodipine 10mg  and benazepril 40mg ) He will need repeat BMP and f/u in HTN clinic within 2 weeks.

## 2021-08-08 NOTE — Telephone Encounter (Signed)
Called pt back in regards to HTN.  He reports BP was 207/76 rechecked prior to leaving SBP 170's last Friday at eye doctor appointment.  BP last night was 178/65-79.  He does not have a record of previous readings. He says he checks BP 3 times per day; but does not keep a record.  He denies HA, CP and has chronic SOB for which he takes albuterol.  He denies increased salt intake.  Denies weight gain reports wt is always 178-180 lbs.  At 07/08/21 OV metoprolol tartrate decreased to 25 mg PO BID from 50 mg PO BID. Advised pt that MD is not in the office today but I will route message to HTN clinic to review.

## 2021-08-14 ENCOUNTER — Other Ambulatory Visit: Payer: Medicare HMO

## 2021-08-19 ENCOUNTER — Other Ambulatory Visit: Payer: Self-pay

## 2021-08-19 MED ORDER — AMLODIPINE BESY-BENAZEPRIL HCL 10-40 MG PO CAPS: 1.0000 | ORAL_CAPSULE | Freq: Every day | ORAL | 1 refills | Status: DC

## 2021-08-22 ENCOUNTER — Ambulatory Visit: Payer: Medicare HMO | Admitting: Pharmacist

## 2021-08-22 ENCOUNTER — Telehealth: Payer: Self-pay | Admitting: Pharmacist

## 2021-08-22 ENCOUNTER — Other Ambulatory Visit: Payer: Self-pay

## 2021-08-22 ENCOUNTER — Other Ambulatory Visit: Payer: Medicare HMO | Admitting: *Deleted

## 2021-08-22 VITALS — BP 172/62 | HR 60 | Wt 186.0 lb

## 2021-08-22 DIAGNOSIS — I10 Essential (primary) hypertension: Secondary | ICD-10-CM

## 2021-08-22 DIAGNOSIS — E785 Hyperlipidemia, unspecified: Secondary | ICD-10-CM | POA: Diagnosis not present

## 2021-08-22 LAB — BASIC METABOLIC PANEL
BUN/Creatinine Ratio: 13 (ref 10–24)
BUN: 14 mg/dL (ref 8–27)
CO2: 21 mmol/L (ref 20–29)
Calcium: 9.3 mg/dL (ref 8.6–10.2)
Chloride: 105 mmol/L (ref 96–106)
Creatinine, Ser: 1.07 mg/dL (ref 0.76–1.27)
Glucose: 169 mg/dL — ABNORMAL HIGH (ref 70–99)
Potassium: 4.5 mmol/L (ref 3.5–5.2)
Sodium: 140 mmol/L (ref 134–144)
eGFR: 73 mL/min/{1.73_m2} (ref >59)

## 2021-08-22 MED ORDER — PANTOPRAZOLE SODIUM 20 MG PO TBEC: 20.0000 mg | DELAYED_RELEASE_TABLET | Freq: Every day | ORAL | 3 refills | Status: DC

## 2021-08-22 MED ORDER — SPIRONOLACTONE 25 MG PO TABS: 25.0000 mg | ORAL_TABLET | Freq: Every day | ORAL | 5 refills | Status: DC

## 2021-08-22 NOTE — Patient Instructions (Addendum)
Your blood pressure is elevated above goal < 130/3mmHg  As long as your labs from today are stable, we'll plan to add spironolactone 25mg  - 1 tablet once daily. We'll call you tomorrow with this plan and schedule follow up in a few weeks  Continue taking your other medications  Stop taking Pepcid. Start taking pantoprazole (Protonix) once a day - this is more similar to your omeprazole and will hopefully work better but doesn't interact as much with your Plavix

## 2021-08-22 NOTE — Progress Notes (Signed)
Patient ID: [name]                 DOB: 05/03/1946                      MRN: [id number]     HPI: [name] is a 75 y.o. male referred by Dr. Acie Duke to HTN clinic. PMH is significant for CAD s/p CABG in 2003, carotid artery disease s/p left and right CEA, PVD, HTN, HLD, DM, diastolic CHF with EF 38-75%, and GERD. Seen in ED on 06/15/21 with LE edema and SOB. BNP was elevated at 670 and pt was discharged on Lasix and KCl supplementation. Echo 07/03/21 showed EF 60-65% with G2DD. Saw MD on 07/08/21, BP was elevated at 150/60, metoprolol was decreased to 25mg  BID. K had dropped to 3 and his supplementation was increased, repeat K improved to 3.7. Pt called clinic 10/13 with reports of elevated BP at home to 170/60 and 186/69. He was advised to increase his amlodipine-benazepril from 5-20mg  to 10-40mg  daily and presents today for follow up.  Pt presents today in good spirits. Reports tolerating higher dose of Lotrel well, still using up old supply and taking 1 capsule BID. Pt asymptomatic with high BP readings, denies dizziness, headache, LE edema. Only noticed when SBP was 204 at his eye doctor which improved to 170 on recheck. Denies dizziness, headache, LE edema. Avoids NSAIDs. Son is a Software engineer, daughter is an Therapist, sports.  Brings in home BP cuff today, bicep cuff he's had for about 5 months. Checks BP twice daily, usually after taking his meds. Home cuff measuring well, reading 168/62 compared to 172/62 by my manual check. Home readings have ranged 122/56 - 192/68 over the past few weeks, mostly in the 643P systolic. HR has improved to 50s-60s on lower dose of metoprolol. Detailed log brought to visit today - will scan into Epic.  Omeprazole was changed to Pepcid at pt's last visit, assuming due to drug interaction with Plavix. Pt reports increase in acid reflux (has Barrett's esophagus) with all meals.  Current HTN meds: amlodipine-benazepril 10-40mg  daily, metoprolol tartrate 25mg  BID, furosemide 40mg   daily  BP goal: <130/48mmHg  Family History: The patient's family history includes Diabetes in his brother, daughter, mother, sister, and sister; Heart attack in his father and mother; Heart disease in his daughter, father, and mother; Hypertension in his daughter, father, and mother.   Social History: The patient  reports that he quit smoking about 19 years ago. His smoking use included cigarettes. He has a 20.00 pack-year smoking history. He has never used smokeless tobacco. He reports that he does not drink alcohol and does not use drugs  Diet: 2 cups of coffee each morning, low sodium diet, cut out processed meats  Exercise: Limited by leg and hip pain  Wt Readings from Last 3 Encounters:  07/08/21 184 lb 9.6 oz (83.7 kg)  06/15/21 190 lb (86.2 kg)  03/28/21 190 lb (86.2 kg)   BP Readings from Last 3 Encounters:  07/08/21 (!) 150/60  06/15/21 (!) 162/54  03/28/21 (!) 170/70   Pulse Readings from Last 3 Encounters:  07/08/21 61  06/15/21 (!) 40  03/28/21 (!) 48    Renal function: CrCl cannot be calculated (Patient's most recent lab result is older than the maximum 21 days allowed.).  Past Medical History:  Diagnosis Date   Arthritis    CAD (coronary artery disease)    Carotid artery occlusion  Diabetes mellitus    fasting 100-120s   Dizziness    Dysrhythmia    skips beats   GERD (gastroesophageal reflux disease)    History of kidney stones    Hyperlipidemia    Hypertension    IHD (ischemic heart disease)    Prior CABG in 2003   Normal nuclear stress test June 2012   Peripheral vascular disease ([place])    Prostate cancer ([place])    Torn rotator cuff     Current Outpatient Medications on File Prior to Visit  Medication Sig Dispense Refill   albuterol (VENTOLIN HFA) 108 (90 Base) MCG/ACT inhaler Inhale 1 puff into the lungs as directed.     amLODipine-benazepril (LOTREL) 10-40 MG capsule Take 1 capsule by mouth daily. 90 capsule 1   aspirin EC 81 MG tablet Take  81 mg by mouth daily.     clopidogrel (PLAVIX) 75 MG tablet Take 1 tablet (75 mg total) by mouth daily. 90 tablet 3   famotidine (PEPCID) 20 MG tablet Take 1 tablet (20 mg total) by mouth 2 (two) times daily. 180 tablet 3   furosemide (LASIX) 40 MG tablet Take 1 tablet (40 mg total) by mouth daily. 90 tablet 3   insulin NPH Human (NOVOLIN N) 100 UNIT/ML injection Inject 45 units [place] BID     insulin regular (NOVOLIN R) 100 units/mL injection Inject up to 20 units [place] TID meals     metFORMIN (GLUCOPHAGE-XR) 500 MG 24 hr tablet Take 500 mg by mouth 2 (two) times daily with a meal.      metoprolol tartrate (LOPRESSOR) 25 MG tablet Take 1 tablet (25 mg total) by mouth 2 (two) times daily. 180 tablet 3   nitroGLYCERIN (NITROSTAT) 0.4 MG SL tablet Place 1 tablet (0.4 mg total) under the tongue every 5 (five) minutes as needed for chest pain. 25 tablet 6   potassium chloride (KLOR-CON) 10 MEQ tablet Take 2 tablets (20 mEq total) by mouth daily. Take 1 tablet (10mg ) 2 times a day for the next 3 days and then daily 180 tablet 3   rosuvastatin (CRESTOR) 20 MG tablet Take 1 tablet by mouth once daily 90 tablet 3   No current facility-administered medications on file prior to visit.    Allergies  Allergen Reactions   Lipitor [Atorvastatin Calcium] Other (See Comments)    Muscle ache   Sulfa Drugs Cross Reactors Other (See Comments)    "raw spots"   Codeine Rash     Assessment/Plan:  1. Hypertension - BP remains elevated above goal < 130/17mmHg. Checking BMET today on higher dose of amlodipine-benazepril. If labs are stable, will plan to add spironolactone 25mg  daily. He will continue on amlodipine-benazepril 10-40mg  daily, metoprolol tartrate 25mg  BID, and furosemide 40mg  daily. Hopefully will be able to cut back on K supplementation. Will schedule follow up for the week of Nov 14th once labs result and med plan finalized (hee sees Dr [name] in early December).  2. Acid reflux - Pt reports increase in  symptoms since omeprazole was changed to Pepcid at last visit to avoid drug interaction with Plavix. Will stop Pepcid and start Protonix 20mg  daily.  Brecklynn Jian E. Velora Horstman, PharmD, BCACP, [place] [address], [place], [place] [address] Phone: [phone]; Fax: [phone] 08/22/2021 10:37 AM

## 2021-08-22 NOTE — Telephone Encounter (Signed)
BMET stable. Will start spironolactone 25mg  daily and decrease KCl from 66meq to 55meq daily since K 4.5. Scheduled BP f/u with PharmD on 11/15. Pt aware of med plans and lab results.

## 2021-08-26 ENCOUNTER — Ambulatory Visit: Payer: Medicare HMO | Admitting: Cardiovascular Disease

## 2021-08-27 DIAGNOSIS — Z87891 Personal history of nicotine dependence: Secondary | ICD-10-CM | POA: Diagnosis not present

## 2021-08-27 DIAGNOSIS — Z7984 Long term (current) use of oral hypoglycemic drugs: Secondary | ICD-10-CM | POA: Diagnosis not present

## 2021-08-27 DIAGNOSIS — E1140 Type 2 diabetes mellitus with diabetic neuropathy, unspecified: Secondary | ICD-10-CM | POA: Diagnosis not present

## 2021-08-27 DIAGNOSIS — Z794 Long term (current) use of insulin: Secondary | ICD-10-CM | POA: Diagnosis not present

## 2021-08-27 DIAGNOSIS — E785 Hyperlipidemia, unspecified: Secondary | ICD-10-CM | POA: Diagnosis not present

## 2021-08-27 DIAGNOSIS — I10 Essential (primary) hypertension: Secondary | ICD-10-CM | POA: Diagnosis not present

## 2021-09-10 ENCOUNTER — Ambulatory Visit: Payer: Medicare HMO | Admitting: Pharmacist

## 2021-09-10 ENCOUNTER — Other Ambulatory Visit: Payer: Self-pay

## 2021-09-10 VITALS — BP 166/56 | HR 42

## 2021-09-10 DIAGNOSIS — I10 Essential (primary) hypertension: Secondary | ICD-10-CM | POA: Diagnosis not present

## 2021-09-10 LAB — BASIC METABOLIC PANEL
BUN/Creatinine Ratio: 17 (ref 10–24)
BUN: 19 mg/dL (ref 8–27)
CO2: 23 mmol/L (ref 20–29)
Calcium: 9.5 mg/dL (ref 8.6–10.2)
Chloride: 104 mmol/L (ref 96–106)
Creatinine, Ser: 1.09 mg/dL (ref 0.76–1.27)
Glucose: 137 mg/dL — ABNORMAL HIGH (ref 70–99)
Potassium: 4.8 mmol/L (ref 3.5–5.2)
Sodium: 138 mmol/L (ref 134–144)
eGFR: 71 mL/min/{1.73_m2} (ref >59)

## 2021-09-10 MED ORDER — CARVEDILOL 3.125 MG PO TABS
3.1250 mg | ORAL_TABLET | Freq: Two times a day (BID) | ORAL | 3 refills | Status: DC
Start: 2021-09-10 — End: 2021-09-30

## 2021-09-10 NOTE — Progress Notes (Signed)
Patient ID: [name]                 DOB: 01-Oct-1946                      MRN: [id number]     HPI: DATHAN ATTIA is a 75 y.o. male referred by Dr. Acie Fredrickson to HTN clinic. PMH is significant for CAD s/p CABG in 2003, carotid artery disease s/p left and right CEA, PVD, HTN, HLD, DM, diastolic CHF with EF 30-16%, and GERD. Seen in ED on 06/15/21 with LE edema and SOB. BNP was elevated at 670 and pt was discharged on Lasix and KCl supplementation. Echo 07/03/21 showed EF 60-65% with G2DD. Saw MD on 07/08/21, BP was elevated at 150/60, metoprolol was decreased to 25mg  BID. K had dropped to 3 and his supplementation was increased, repeat K improved to 3.7. Pt called clinic 10/13 with reports of elevated BP at home to 170/60 and 186/69. He was advised to increase his amlodipine-benazepril from 5-20mg  to 10-40mg  daily. At last visit with me on 10/27, BP remained elevated at 172/62 and spironolactone 25mg  daily was started, K supplementation decreased to 96meq.  Pt presents today in good spirits. Reports feeling better than he did at his last visit. Protonix working to control his acid reflux much better than H2 blocker. Tolerating spironolactone well. Denies dizziness, headache, and LE edema. Took AM meds 90 minutes ago. Was previously drinking 2 cups of coffee which he has since stopped. Avoids NSAIDs. Son is a Software engineer, daughter is an Therapist, sports.  Brings in detailed log of home BP readings and HR. Home bicep BP cuff previously brought to his last visit and was measuring accurately. Home readings remain elevated, generally 150-160s/50-60s, HR 40s-60s. Lowest BP 127/56 and highest 180/55, many HR in the low 40s-50s. Will scan log into Epic.  Current HTN meds: amlodipine-benazepril 10-40mg  daily, spironolactone 25mg  daily, metoprolol tartrate 25mg  BID, furosemide 40mg  daily  BP goal: <130/67mmHg  Family History: The patient's family history includes Diabetes in his brother, daughter, mother, sister, and sister; Heart  attack in his father and mother; Heart disease in his daughter, father, and mother; Hypertension in his daughter, father, and mother.   Social History: The patient  reports that he quit smoking about 19 years ago. His smoking use included cigarettes. He has a 20.00 pack-year smoking history. He has never used smokeless tobacco. He reports that he does not drink alcohol and does not use drugs  Diet: stopped drinking caffeine (was having 2 cups of coffee and some tea), low sodium diet, cut out processed meats  Exercise: Limited by leg and hip pain  Wt Readings from Last 3 Encounters:  08/22/21 186 lb (84.4 kg)  07/08/21 184 lb 9.6 oz (83.7 kg)  06/15/21 190 lb (86.2 kg)   BP Readings from Last 3 Encounters:  08/22/21 (!) 172/62  07/08/21 (!) 150/60  06/15/21 (!) 162/54   Pulse Readings from Last 3 Encounters:  08/22/21 60  07/08/21 61  06/15/21 (!) 40    Renal function: CrCl cannot be calculated (Unknown ideal weight.).  Past Medical History:  Diagnosis Date   Arthritis    CAD (coronary artery disease)    Carotid artery occlusion    Diabetes mellitus    fasting 100-120s   Dizziness    Dysrhythmia    skips beats   GERD (gastroesophageal reflux disease)    History of kidney stones    Hyperlipidemia  Hypertension    IHD (ischemic heart disease)    Prior CABG in 2003   Normal nuclear stress test June 2012   Peripheral vascular disease ([place])    Prostate cancer ([place])    Torn rotator cuff     Current Outpatient Medications on File Prior to Visit  Medication Sig Dispense Refill   albuterol (VENTOLIN HFA) 108 (90 Base) MCG/ACT inhaler Inhale 1 puff into the lungs as directed.     amLODipine-benazepril (LOTREL) 10-40 MG capsule Take 1 capsule by mouth daily. 90 capsule 1   aspirin EC 81 MG tablet Take 81 mg by mouth daily.     clopidogrel (PLAVIX) 75 MG tablet Take 1 tablet (75 mg total) by mouth daily. 90 tablet 3   furosemide (LASIX) 40 MG tablet Take 1 tablet (40 mg  total) by mouth daily. 90 tablet 3   insulin NPH Human (NOVOLIN N) 100 UNIT/ML injection Inject 45 units [place] BID     insulin regular (NOVOLIN R) 100 units/mL injection Inject up to 20 units [place] TID meals     metFORMIN (GLUCOPHAGE-XR) 500 MG 24 hr tablet Take 500 mg by mouth 2 (two) times daily with a meal.      metoprolol tartrate (LOPRESSOR) 25 MG tablet Take 1 tablet (25 mg total) by mouth 2 (two) times daily. 180 tablet 3   nitroGLYCERIN (NITROSTAT) 0.4 MG SL tablet Place 1 tablet (0.4 mg total) under the tongue every 5 (five) minutes as needed for chest pain. 25 tablet 6   pantoprazole (PROTONIX) 20 MG tablet Take 1 tablet (20 mg total) by mouth daily. 90 tablet 3   potassium chloride (KLOR-CON) 10 MEQ tablet Take 10 mEq by mouth daily. 180 tablet 3   rosuvastatin (CRESTOR) 20 MG tablet Take 1 tablet by mouth once daily 90 tablet 3   spironolactone (ALDACTONE) 25 MG tablet Take 1 tablet (25 mg total) by mouth daily. 30 tablet 5   No current facility-administered medications on file prior to visit.    Allergies  Allergen Reactions   Lipitor [Atorvastatin Calcium] Other (See Comments)    Muscle ache   Sulfa Drugs Cross Reactors Other (See Comments)    "raw spots"   Codeine Rash     Assessment/Plan:  1. Hypertension - BP remains elevated above goal < 130/35mmHg. Checking BMET today since starting spironolactone and reducing K supplement to 94meq. If labs are stable, will plan to increase spironolactone to 50mg  daily and can hopefully stop K supplementation. Will also stop metoprolol due to bradycardia and replace with carvedilol 3.125mg  BID - should provide better BP control but starting at lower than equivalent dose to help increase HR. If HR remains in the 40s-50s at next visit, will need to discontinue. He will continue on amlodipine-benazepril 10-40mg  daily and furosemide 40mg  daily. Pt sees Dr Acie Fredrickson in early December for follow up.   Valeria Boza E. Heywood Tokunaga, PharmD, BCACP, [place] [address] [place], [place] [address] Phone: [phone]; Fax: [phone] 09/10/2021 7:15 AM

## 2021-09-10 NOTE — Patient Instructions (Addendum)
It was nice to see you today!  Your blood pressure goal is < 130/56mmHg  STOP taking metoprolol  START taking carvedilol 3.125mg  - 1 tablet twice daily. This will lower your blood pressure more than the metoprolol but is a lower dose so will hopefully help to increase your heart rate a bit.  We'll check your labs today and if everything is stable, would increase the dose of your spironolactone and stop your potassium supplement. I will call you with your lab results to finalize this part of your plan

## 2021-09-11 ENCOUNTER — Telehealth: Payer: Self-pay | Admitting: Pharmacist

## 2021-09-11 MED ORDER — SPIRONOLACTONE 50 MG PO TABS: 50.0000 mg | ORAL_TABLET | Freq: Every day | ORAL | 5 refills | Status: DC

## 2021-09-11 NOTE — Telephone Encounter (Signed)
BMET stable. Will increase spironolactone from 25mg  to 50mg  daily for additional BP lowering and will stop [place] Con 98meq daily. Recheck BMET and BP at upcoming office visit with Dr Acie Fredrickson on 12/5. Pt aware of med plan and lab results.

## 2021-09-29 ENCOUNTER — Encounter: Payer: Self-pay | Admitting: Cardiovascular Disease

## 2021-09-29 NOTE — Progress Notes (Signed)
Cardiology Office Note   Date:  09/30/2021   ID:  [name], [name] 30-Mar-1946, MRN [id number]  PCP:  Connor Pao, MD  Cardiologist: previously Connor Coco MD , now Connor Moores, MD   Chief Complaint  Patient presents with   Coronary Artery Disease   Hyperlipidemia   Problem list 1. Coronary artery disease-status post CABG 2003 2. Hyperlipidemia 3. Carotid artery disease-status post left carotid stenting , s/p Right CEA  4. Essential hypertension 5. Diabetes mellitus     [name] is a 75 y.o. male who presents for a six-month follow-up visit  Notes from Dr. Mare Duke: . He has a past history of coronary artery disease and had coronary artery bypass graft surgery in 2003. He has a remote history of a right carotid endarterectomy about 18 years ago.  He had a left carotid endarterectomy by Dr. Oneida Duke in 2014. He has a history of high blood pressure, diabetes mellitus, and hyperlipidemia. He had a normal nuclear stress test in June 2012 prior to rotator cuff surgery.  The patient developed claudication of his right leg  and underwent successful vascular surgery in February 2015.   Since last visit he has been doing well with no chest pain or shortness of breath. Since last visit he has retired and has been enjoying retirement.  He is no longer experiencing any right calf claudication.  He has diabetes followed by Dr. Tamala [facility] at [facility].  His most recent A1c was 7.2 Since last visit the patient has had no new cardiac symptoms.   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  He himself lives in a townhouse.    Aug. 18, 2017:  [name] is seen today for the first time. He's a previous patient of Dr. Mare Duke. He is followed for coronary artery disease, hyperlipidemia, carotid artery disease, hypertension.  Doing well.   Stays active.   Does lots of woodworking . Walks often   No angina    Feb. 14, 2018; Doing well.  No CP or dyspnea. Not exercising much ,  Avoids salt.   Oct. 1, 2018:  Here for eval of HTN. Still eating some salty food . Avoids hot dogs now .  stopped smoking years ago. As active as he can be,  Has a heel spur  February 23, 2018:  BP hyas been normal blood pressure is a bit high Is avoiding hot dogs  No CP or dyspnea.  Has a left carotid stent, s/p R CEA   August 24, 2018: [name]  is seen today for follow-up of his coronary artery disease, hypertension and carotid artery disease. Staying active,  Works in the garden,  Works in his woodworking shop  September 06, 2020: [name] is seen today for a follow-up of his coronary artery disease, coronary artery bypass grafting and carotid artery disease. Has prostate cancer,  Getting XRT.  Still doing some wood working.   Staying active  Has not felt well.   Sept. 12. 2022: [name] is seen today for follow up of his CAD, , CABG, carotid artery disease, HLd He was recently seen in the ER on Aug. 20, 2022  for leg swelling  He was given Lasix 40 mg BID for 3 days and then 40 mg a day Also was started on /kdur 10 meq BID x 3 days, followed by  Kdur 10 meq a day  Had lots  of abdominal swelling  Leg swelling is better.   Is wearing compression hose.   We will draw basic metabolic profile today to assess his renal function and potassium after being on Lasix and potassium for several weeks.  His daughter typed out a sheet of questions for me to answer. I informed her that he has class II-III congestive heart failure.  His ejection fraction is 60 to 65%.  He has grade 2 diastolic dysfunction. Was also found to have a old inferior wall myocardial infarction that likely occurred before his CABG.  Concerning when the family should call, if he gains 3 pounds in the course of 1 day or 5 pounds in the course of the week they should call to get further advice.  He will continue to wear his compression hose daily  if possible.  If not daily then he should wear them as much as possible.  He is leg edema is gone and I do not think there is any role and drawn at brain natruretic peptide at this time.  He has some underlying renal insufficiency so a BNP is not going to be particularly useful.  He will return to see me in 3 months with a basic metabolic profile  September 30, 2021:  [name] is seen today for follow-up of his coronary artery disease,  HLD, hypertension, diabetes mellitus. Chf symptoms    Labs from Nov. 15, 2022 look stable  Complains of constipation   Past Medical History:  Diagnosis Date   Arthritis    CAD (coronary artery disease)    Carotid artery occlusion    Diabetes mellitus    fasting 100-120s   Dizziness    Dysrhythmia    skips beats   GERD (gastroesophageal reflux disease)    History of kidney stones    Hyperlipidemia    Hypertension    IHD (ischemic heart disease)    Prior CABG in 2003   Normal nuclear stress test June 2012   Peripheral vascular disease ([place])    Prostate cancer ([place])    Torn rotator cuff     Past Surgical History:  Procedure Laterality Date   ABDOMINAL AORTAGRAM N/A 12/21/2013   Procedure: ABDOMINAL Maxcine Ham;  Surgeon: Elam Dutch, MD;  Location[facility] C[facility] LAB;  Service: Cardiovascular;  Laterality: N/A;   ABDOMINAL AORTOGRAM N/A 11/13/2017   Procedure: ABDOMINAL AORTOGRAM;  Surgeon: Elam Dutch, MD;  Location: [facility] CV LAB;  Service: Cardiovascular;  Laterality: N/A;   APPENDECTOMY     ARCH AORTOGRAM  08/26/2013   Procedure: ARCH AORTOGRAM;  Surgeon: Elam Dutch, MD;  Location: [facility] CATH LAB;  Service: Cardiovascular;;   CARDIAC CATHETERIZATION  05/03/2002   EF 40-45%   CARDIOVASCULAR STRESS TEST  08/10/2006   EF 65%   CAROTID ENDARTERECTOMY     CAROTID STENT INSERTION Left 08/26/2013   Procedure: CAROTID STENT INSERTION;  Surgeon: Elam Dutch, MD;  Location: [facility] CATH LAB;  Service: Cardiovascular;  Laterality: Left;   internal carotid   CORONARY ARTERY BYPASS GRAFT  04/2002   DOPPLER ECHOCARDIOGRAPHY  08/12/2002   EF 80-85%   ENDARTERECTOMY Left 07/20/2013   Procedure: ATTEMPTED ENDARTERECTOMY CAROTID;  Surgeon: Conrad Warren, MD;  Location: [place];  Service: Vascular;  Laterality: Left;   HERNIA REPAIR     LOWER EXTREMITY ANGIOGRAPHY  11/13/2017   Procedure: Lower Extremity Angiography;  Surgeon: Elam Dutch, MD;  Location: [place] CV LAB;  Service: Cardiovascular;;   PERIPHERAL VASCULAR INTERVENTION Right  11/13/2017   Procedure: PERIPHERAL VASCULAR INTERVENTION;  Surgeon: Elam Dutch, MD;  Location: [place] CV LAB;  Service: Cardiovascular;  Laterality: Right;  superficial femoral   SHOULDER SURGERY       Current Outpatient Medications  Medication Sig Dispense Refill   albuterol (VENTOLIN HFA) 108 (90 Base) MCG/ACT inhaler Inhale 1 puff into the lungs as directed.     amLODipine-benazepril (LOTREL) 10-40 MG capsule Take 1 capsule by mouth daily. 90 capsule 1   aspirin EC 81 MG tablet Take 81 mg by mouth daily.     carvedilol (COREG) 6.25 MG tablet Take 1 tablet (6.25 mg total) by mouth 2 (two) times daily. 180 tablet 3   clopidogrel (PLAVIX) 75 MG tablet Take 1 tablet (75 mg total) by mouth daily. 90 tablet 3   furosemide (LASIX) 40 MG tablet Take 1 tablet (40 mg total) by mouth daily. 90 tablet 3   insulin NPH Human (NOVOLIN N) 100 UNIT/ML injection Inject 45 units [place] BID     insulin regular (NOVOLIN R) 100 units/mL injection Inject up to 20 units [place] TID meals     metFORMIN (GLUCOPHAGE-XR) 500 MG 24 hr tablet Take 500 mg by mouth 2 (two) times daily with a meal.      nitroGLYCERIN (NITROSTAT) 0.4 MG SL tablet Place 1 tablet (0.4 mg total) under the tongue every 5 (five) minutes as needed for chest pain. 25 tablet 6   pantoprazole (PROTONIX) 20 MG tablet Take 1 tablet (20 mg total) by mouth daily. 90 tablet 3   rosuvastatin (CRESTOR) 20 MG tablet Take 1 tablet by mouth once daily 90  tablet 3   spironolactone (ALDACTONE) 50 MG tablet Take 1 tablet (50 mg total) by mouth daily. 90 tablet 3   No current facility-administered medications for this visit.    Allergies:   Lipitor [atorvastatin calcium], Sulfa drugs cross reactors, and Codeine    Social History:  The patient  reports that he quit smoking about 19 years ago. His smoking use included cigarettes. He has a 20.00 pack-year smoking history. He has never used smokeless tobacco. He reports that he does not drink alcohol and does not use drugs.   Family History:  The patient's family history includes Diabetes in his brother, daughter, mother, sister, and sister; Heart attack in his father and mother; Heart disease in his daughter, father, and mother; Hypertension in his daughter, father, and mother.    ROS:  Please see the history of present illness.   Otherwise, review of systems are positive for none.   All other systems are reviewed and negative.   Physical Exam: Blood pressure (!) 142/78, pulse 77, height 5\' 11"  (1.803 m), weight 185 lb (83.9 kg), SpO2 98 %.  GEN:  Well nourished, well developed in no acute distress HEENT: Normal NECK: No JVD; bilat carotid brits  LYMPHATICS: No lymphadenopathy CARDIAC: RRR , with occasional premature beats  RESPIRATORY:  Clear to auscultation without rales, wheezing or rhonchi  ABDOMEN: Soft, non-tender, non-distended MUSCULOSKELETAL:  No edema; No deformity  SKIN: Warm and dry NEUROLOGIC:  Alert and oriented x 3   EKG:      Recent Labs: 06/15/2021: ALT 14; B Natriuretic Peptide 643.1; Hemoglobin 13.1; Platelets 189 09/10/2021: BUN 19; Creatinine, Ser 1.09; Potassium 4.8; Sodium 138    Lipid Panel    Component Value Date/Time   CHOL 132 08/24/2018 0825   TRIG 149 08/24/2018 0825   HDL 34 (L) 08/24/2018 0825   CHOLHDL 3.9 08/24/2018  0825   CHOLHDL 3.2 06/13/2016 0918   VLDL 21 06/13/2016 0918   LDLCALC 68 08/24/2018 0825    Wt Readings from Last 3 Encounters:   09/30/21 185 lb (83.9 kg)  08/22/21 186 lb (84.4 kg)  07/08/21 184 lb 9.6 oz (83.7 kg)      ASSESSMENT AND PLAN:  1.   CAD :     no angina ,  s/p cabg  2. diabetes mellitus insulin-      3.  Acute on chronic diastolic congestive heart failure:  Better . Cont meds.   4. Hyperlipidemia.   - .lipids look good .  Cont meds   6. Bilateral carotid artery disease:   -  Following     Current medicines are reviewed at length with the patient today.  The patient does not have concerns regarding medicines.  The following changes have been made:  no change  Labs/ tests ordered today include:   No orders of the defined types were placed in this encounter.      Connor Moores, MD  09/30/2021 1:16 PM    [facility] [place],  [place], [place]  [address] Pager [phone] Phone: [phone]; Fax: [phone]

## 2021-09-30 ENCOUNTER — Ambulatory Visit: Payer: Medicare HMO | Admitting: Cardiovascular Disease

## 2021-09-30 ENCOUNTER — Other Ambulatory Visit: Payer: Self-pay

## 2021-09-30 ENCOUNTER — Encounter: Payer: Self-pay | Admitting: Cardiovascular Disease

## 2021-09-30 VITALS — BP 142/78 | HR 77 | Ht 71.0 in | Wt 185.0 lb

## 2021-09-30 DIAGNOSIS — I6523 Occlusion and stenosis of bilateral carotid arteries: Secondary | ICD-10-CM | POA: Diagnosis not present

## 2021-09-30 DIAGNOSIS — I5033 Acute on chronic diastolic (congestive) heart failure: Secondary | ICD-10-CM

## 2021-09-30 DIAGNOSIS — I10 Essential (primary) hypertension: Secondary | ICD-10-CM

## 2021-09-30 MED ORDER — CARVEDILOL 6.25 MG PO TABS: 6.2500 mg | ORAL_TABLET | Freq: Two times a day (BID) | ORAL | 3 refills | Status: DC

## 2021-09-30 MED ORDER — SPIRONOLACTONE 50 MG PO TABS: 50.0000 mg | ORAL_TABLET | Freq: Every day | ORAL | 3 refills | Status: DC

## 2021-09-30 NOTE — Patient Instructions (Signed)
Medication Instructions:  Please increase Carvedilol to 6.25 mg twice a day. Continue all other medications as listed.  *If you need a refill on your cardiac medications before your next appointment, please call your pharmacy*  Follow-Up: At [facility], you and your health needs are our priority.  As part of our continuing mission to provide you with exceptional heart care, we have created designated Provider Care Teams.  These Care Teams include your primary Cardiologist (physician) and Advanced Practice Providers (APPs -  Physician Assistants and Nurse Practitioners) who all work together to provide you with the care you need, when you need it.  We recommend signing up for the patient portal called "MyChart".  Sign up information is provided on this After Visit Summary.  MyChart is used to connect with patients for Virtual Visits (Telemedicine).  Patients are able to view lab/test results, encounter notes, upcoming appointments, etc.  Non-urgent messages can be sent to your provider as well.   To learn more about what you can do with MyChart, go to [email or web address].    Your next appointment:   6 month(s)  The format for your next appointment:   In Person  Provider:   Robbie Lis, PA-C, Melina Copa, PA-C, Cecilie Kicks, NP, Ermalinda Barrios, PA-C, Christen Bame, NP, or Richardson Dopp, PA-C        Thank you for choosing [facility]!!

## 2021-10-01 ENCOUNTER — Telehealth: Payer: Self-pay | Admitting: Pharmacist

## 2021-10-01 DIAGNOSIS — I10 Essential (primary) hypertension: Secondary | ICD-10-CM

## 2021-10-01 NOTE — Telephone Encounter (Signed)
Pt was supposed to have BMET checked yesterday after increasing his dose of spironolactone to 50mg  daily and stopping his K supplement. This was not done.  Called pt, he will come in tomorrow for BMET. Reports home systolic BP still fluctuating and has ranged 130-170 over the past few weeks.  Advised him not to pick up refill of spironolactone that was sent in yesterday. If labs are stable, will plan to increase spironolactone to 100mg  daily. He verbalized understanding.

## 2021-10-02 ENCOUNTER — Telehealth: Payer: Self-pay | Admitting: Pharmacist

## 2021-10-02 ENCOUNTER — Other Ambulatory Visit: Payer: Self-pay | Admitting: Pharmacist

## 2021-10-02 ENCOUNTER — Other Ambulatory Visit: Payer: Self-pay

## 2021-10-02 ENCOUNTER — Other Ambulatory Visit: Payer: Medicare HMO | Admitting: *Deleted

## 2021-10-02 DIAGNOSIS — I10 Essential (primary) hypertension: Secondary | ICD-10-CM

## 2021-10-02 LAB — BASIC METABOLIC PANEL
BUN/Creatinine Ratio: 14 (ref 10–24)
BUN: 13 mg/dL (ref 8–27)
CO2: 24 mmol/L (ref 20–29)
Calcium: 9 mg/dL (ref 8.6–10.2)
Chloride: 106 mmol/L (ref 96–106)
Creatinine, Ser: 0.92 mg/dL (ref 0.76–1.27)
Glucose: 84 mg/dL (ref 70–99)
Potassium: 3.8 mmol/L (ref 3.5–5.2)
Sodium: 142 mmol/L (ref 134–144)
eGFR: 87 mL/min/{1.73_m2} (ref >59)

## 2021-10-02 MED ORDER — SPIRONOLACTONE 100 MG PO TABS: 100.0000 mg | ORAL_TABLET | Freq: Every day | ORAL | 3 refills | Status: DC

## 2021-10-02 NOTE — Telephone Encounter (Signed)
BMET stable on spiro 50mg . Will increase spironolactone to 100mg  daily for further BP lowering. Pt aware of lab results and med change. He will record his BP at home and I'll call in a few weeks to follow up. Will schedule f/u BMEt at that time as well.

## 2021-10-07 ENCOUNTER — Telehealth: Payer: Self-pay | Admitting: Cardiovascular Disease

## 2021-10-07 NOTE — Telephone Encounter (Signed)
  Pt's daughter calling, she said pt's having increased SOB, and gained 0.6 Lbs for the last few days, she also added that pt been using inhaler with no improvement. She said to call pt for recommendations

## 2021-10-07 NOTE — Telephone Encounter (Signed)
Returned call to Pt.  Per Pt he is SOB today-and he states he has gained 3 pounds.  He states he took his AM lasix 40 mg today.  Advised to take an additional lasix 40 mg x1 now.  Will check back in with Pt tomorrow to see if he is improved.

## 2021-10-08 NOTE — Telephone Encounter (Signed)
Followed up w/ pt who reports he is "doing so much better today". SOB much improved, and thinks he lost couple pds. Advised to call back if symptoms start to reappear. Will forward to MD for his FYI of pt improvement.

## 2021-10-14 ENCOUNTER — Telehealth: Payer: Self-pay | Admitting: Pharmacist

## 2021-10-14 NOTE — Telephone Encounter (Signed)
Patient's daughter [name] called stating that patient has had constipation ever since being dx with CHF and starting new meds. He took MOM and had a bowel movement. Also started miralax Friday but has not had a BM since. Furosemide could cause constiptation. Carvedilol and spironolactone tend to cause diarrhea. I advised that miralax can take several days to provide a BM. Advised to try senna/docusate (stop other docusate) for a few days. If that doesn't work then we can try switching to torsemide. Daughter will call back.

## 2021-10-18 ENCOUNTER — Telehealth: Payer: Self-pay | Admitting: Pharmacist

## 2021-10-18 MED ORDER — HYDRALAZINE HCL 25 MG PO TABS: 25.0000 mg | ORAL_TABLET | Freq: Three times a day (TID) | ORAL | 3 refills | Status: DC

## 2021-10-18 NOTE — Telephone Encounter (Signed)
Called pt to follow up with home BP readings since increasing spironolactone to 100mg  daily. Reports they have still been elevated. One reading of 118/50 but 130-140s is otherwise the best systolic he's seeing. This AM was 167/62, last night was 905/02, other systolics of 561/548, and a high of 170. HR 50s-60s.  Pt already maxed out on amlodipine-benazepril 10-40mg  daily, spironolactone 100mg  daily, and carvedilol 6.25mg  BID (HR preventing further dose titration). Cannot start thiazide since pt already on furosemide for his diastolic CHF. Will add hydralazine 25mg  TID and scheduled follow up in 3 weeks with me for BP check and BMET on higher MRA dose.

## 2021-11-11 ENCOUNTER — Other Ambulatory Visit: Payer: Self-pay

## 2021-11-11 ENCOUNTER — Ambulatory Visit: Payer: Medicare HMO | Admitting: Pharmacist

## 2021-11-11 VITALS — BP 156/62 | HR 63 | Wt 184.6 lb

## 2021-11-11 DIAGNOSIS — I10 Essential (primary) hypertension: Secondary | ICD-10-CM | POA: Diagnosis not present

## 2021-11-11 MED ORDER — HYDRALAZINE HCL 50 MG PO TABS: 50.0000 mg | ORAL_TABLET | Freq: Three times a day (TID) | ORAL | 5 refills | Status: DC

## 2021-11-11 NOTE — Patient Instructions (Addendum)
°  Blood pressure goal is <130/80.  Increase hydralazine to 50 mg by mouth three times daily. You can double up the remaining prescription, hydralazine 2 tablets of 25 mg tablets, three times daily. When you pick up the new prescription, you can go up to taking hydralazine 1 tablet of 50 mg tablets, three times daily.  Continue other blood pressure medications.  We will check your labs today, and call you with the results.   Continue a heart healthy diet and exercising as able.

## 2021-11-11 NOTE — Progress Notes (Deleted)
° °  Subjective:    Patient ID: [name], male    DOB: [birth year], 76 y.o.   MRN: [id number]  HPI    Review of Systems     Objective:   Physical Exam        Assessment & Plan:

## 2021-11-11 NOTE — Progress Notes (Signed)
Patient ID: [name]                 DOB: [birth year]                      MRN: [id number]     HPI: [name] is a 76 y.o. male referred by Dr. Acie Fredrickson to HTN clinic. PMH is significant for CAD s/p CABG in 2003, left and right CEA, PVD, HTN, HLD, DM, diastolic CHF with EF 54-27%, and GERD. Seen in ED on 06/15/21 with LE edema and SOB. BNP was elevated at 670 and pt was discharged on Lasix and KCl supplementation. Echo 07/03/21 showed EF 60-65% with G2DD. Saw MD on 07/08/21, BP was elevated at 150/60, metoprolol was decreased to 25mg  BID. K had dropped to 3 and his supplementation was increased, repeat K improved to 3.7. Pt called clinic 10/13 with reports of elevated BP at home to 170/60 and 186/69. He was advised to increase his amlodipine-benazepril from 5-20mg  to 10-40mg  daily. At visit with PharmD on 10/27, BP remained elevated at 172/62 and spironolactone 25mg  daily was started, K supplementation decreased to 26meq. At last PharmD visit, home BP reading remained elevated in 150-160s/50-60s, HR 40-60s. BMET stable, so increased spironolactone from 25 to 50 mg daily and stopped [place] Con 10 meq daily. Also stopped metoprolol d/t bradycardia and changed to carvedilol 3.125 mg BID. Sine then, carvedilol was increased to 6.25 mg BID at 09/30/21 MD visit and spironolactone increased from 50 to 100 mg daily on 12/7. Home BP remained elevated and I started pt on hydralazine 25 mg TID 10/18/21 with follow up today.  Pt presents to the clinic reporting adherence to new regimen including hydralazine 25 mg TID. Denies lightheadedness, dizziness, blurry vision, HA, or edema. Pt brought detailed home blood pressure log, with SBP averaging in the 150-160s/50-60s and HR in 60-70s. Pt reported taking his once daily medications at 7 AM in the morning, and had taken his blood pressure meds this morning. Pt brought his home blood pressure monitor with arm cuff and blood pressure monitor with wrist cuff to compare blood  pressure measurements. Wrist blood pressure aligned most closely to manual blood pressure reading in clinic. Pt stated he was almost out of his furosemide (unsure of exactly how much he had left), but fill history indicates he should have refills left.   Current HTN meds: amlodipine-benazepril 10-40mg  daily (morning), spironolactone 100mg  daily (morning), carvedilol 6.25 mg BID, hydralazine 25mg  TID (7am, 3pm, 11pm), furosemide 40mg  daily  BP goal: <130/66mmHg  Family History: The patient's family history includes Diabetes in his brother, daughter, mother, sister, and sister; Heart attack in his father and mother; Heart disease in his daughter, father, and mother; Hypertension in his daughter, father, and mother.   Social History: The patient reports that he quit smoking about 19 years ago. His smoking use included cigarettes. He has a 20.00 pack-year smoking history. He has never used smokeless tobacco. He reports that he does not drink alcohol and does not use drugs  Diet: stopped drinking caffeine (was having 2 cups of coffee and some tea), low sodium diet, cut out processed meats   Exercise: Limited by leg and hip pain  Home Blood Pressures:  High 172/63, low 115/66, average 150-160s/50-60s, HR 60-70s Home wrist cuff (at visit) 151/60 Home arm cuff (at visit): 164/64  Wt Readings from Last 3 Encounters:  09/30/21 185 lb (83.9 kg)  08/22/21 186 lb (84.4 kg)  07/08/21 184 lb 9.6 oz (83.7 kg)   BP Readings from Last 3 Encounters:  09/30/21 (!) 142/78  09/10/21 (!) 166/56  08/22/21 (!) 172/62   Pulse Readings from Last 3 Encounters:  09/30/21 77  09/10/21 (!) 42  08/22/21 60    Renal function: CrCl cannot be calculated (Patient's most recent lab result is older than the maximum 21 days allowed.).  Past Medical History:  Diagnosis Date   Arthritis    CAD (coronary artery disease)    Carotid artery occlusion    Diabetes mellitus    fasting 100-120s   Dizziness     Dysrhythmia    skips beats   GERD (gastroesophageal reflux disease)    History of kidney stones    Hyperlipidemia    Hypertension    IHD (ischemic heart disease)    Prior CABG in 2003   Normal nuclear stress test June 2012   Peripheral vascular disease ([place])    Prostate cancer ([place])    Torn rotator cuff     Current Outpatient Medications on File Prior to Visit  Medication Sig Dispense Refill   albuterol (VENTOLIN HFA) 108 (90 Base) MCG/ACT inhaler Inhale 1 puff into the lungs as directed.     amLODipine-benazepril (LOTREL) 10-40 MG capsule Take 1 capsule by mouth daily. 90 capsule 1   aspirin EC 81 MG tablet Take 81 mg by mouth daily.     carvedilol (COREG) 6.25 MG tablet Take 1 tablet (6.25 mg total) by mouth 2 (two) times daily. 180 tablet 3   clopidogrel (PLAVIX) 75 MG tablet Take 1 tablet (75 mg total) by mouth daily. 90 tablet 3   furosemide (LASIX) 40 MG tablet Take 1 tablet (40 mg total) by mouth daily. 90 tablet 3   hydrALAZINE (APRESOLINE) 25 MG tablet Take 1 tablet (25 mg total) by mouth 3 (three) times daily. 90 tablet 3   insulin NPH Human (NOVOLIN N) 100 UNIT/ML injection Inject 45 units [place] BID     insulin regular (NOVOLIN R) 100 units/mL injection Inject up to 20 units [place] TID meals     metFORMIN (GLUCOPHAGE-XR) 500 MG 24 hr tablet Take 500 mg by mouth 2 (two) times daily with a meal.      nitroGLYCERIN (NITROSTAT) 0.4 MG SL tablet Place 1 tablet (0.4 mg total) under the tongue every 5 (five) minutes as needed for chest pain. 25 tablet 6   pantoprazole (PROTONIX) 20 MG tablet Take 1 tablet (20 mg total) by mouth daily. 90 tablet 3   rosuvastatin (CRESTOR) 20 MG tablet Take 1 tablet by mouth once daily 90 tablet 3   spironolactone (ALDACTONE) 100 MG tablet Take 1 tablet (100 mg total) by mouth daily. 90 tablet 3   No current facility-administered medications on file prior to visit.    Allergies  Allergen Reactions   Lipitor [Atorvastatin Calcium] Other (See Comments)     Muscle ache   Sulfa Drugs Cross Reactors Other (See Comments)    "raw spots"   Codeine Rash     Assessment/Plan:  1. Hypertension - BP remains elevated above goal < 130/75mmHg. Will increase hydralazine from 25 mg TID to 50 mg TID. Continue spironolactone 100 mg daily, carvedilol 6.25 mg BID, amlodipine-benazepril 10-40 mg daily, furosemide 40 mg daily. Will call patient with results of BMET to assess renal function and potassium levels. Advised pt to continue a heart healthy diet and exercising, as able. Will call pt in 2-3 weeks to assess home BP readings. Will  further increase hydralazine dose if needed.  Jethro Poling, PharmD Candidate [phone] assisted in this visit.  Megan E. Supple, PharmD, BCACP, [place] [address] [place],  [address] Phone: [phone]; Fax: [phone] 11/11/2021 12:47 PM

## 2021-11-12 LAB — BASIC METABOLIC PANEL
BUN/Creatinine Ratio: 13 (ref 10–24)
BUN: 14 mg/dL (ref 8–27)
CO2: 21 mmol/L (ref 20–29)
Calcium: 9 mg/dL (ref 8.6–10.2)
Chloride: 107 mmol/L — ABNORMAL HIGH (ref 96–106)
Creatinine, Ser: 1.04 mg/dL (ref 0.76–1.27)
Glucose: 60 mg/dL — ABNORMAL LOW (ref 70–99)
Potassium: 3.8 mmol/L (ref 3.5–5.2)
Sodium: 145 mmol/L — ABNORMAL HIGH (ref 134–144)
eGFR: 75 mL/min/{1.73_m2} (ref >59)

## 2021-11-28 ENCOUNTER — Telehealth: Payer: Self-pay | Admitting: Pharmacist

## 2021-11-28 MED ORDER — HYDRALAZINE HCL 100 MG PO TABS: 100.0000 mg | ORAL_TABLET | Freq: Three times a day (TID) | ORAL | 11 refills | Status: DC

## 2021-11-28 NOTE — Telephone Encounter (Signed)
Called pt to follow up with home BP readings since increasing hydralazine from 25mg  to 50mg  TID on 1/16. He continues on spironolactone 100mg  daily, carvedilol 6.25mg  BID, amlodipine-benazepril 10-40mg  daily, and furosemide 40mg  daily.  Home BP: 164/59, 128/53, 144/60, 145/52, 125/67, 137/53. Will increase hydralazine to 100mg  TID. Pt aware to continue other meds and continue monitoring BP at home. I'll call in 3 weeks for BP update.

## 2021-11-30 ENCOUNTER — Encounter (HOSPITAL_BASED_OUTPATIENT_CLINIC_OR_DEPARTMENT_OTHER): Payer: Self-pay | Admitting: Emergency Medicine

## 2021-11-30 ENCOUNTER — Other Ambulatory Visit: Payer: Self-pay

## 2021-11-30 ENCOUNTER — Emergency Department (HOSPITAL_BASED_OUTPATIENT_CLINIC_OR_DEPARTMENT_OTHER): Payer: Medicare HMO | Admitting: Radiology

## 2021-11-30 ENCOUNTER — Emergency Department (HOSPITAL_BASED_OUTPATIENT_CLINIC_OR_DEPARTMENT_OTHER)
Admission: EM | Admit: 2021-11-30 | Discharge: 2021-11-30 | Discharge status: Against Medical Advice | Payer: Medicare HMO | Attending: Emergency Medicine | Admitting: Emergency Medicine

## 2021-11-30 DIAGNOSIS — R0902 Hypoxemia: Secondary | ICD-10-CM | POA: Insufficient documentation

## 2021-11-30 DIAGNOSIS — I110 Hypertensive heart disease with heart failure: Secondary | ICD-10-CM | POA: Insufficient documentation

## 2021-11-30 DIAGNOSIS — E876 Hypokalemia: Secondary | ICD-10-CM | POA: Diagnosis not present

## 2021-11-30 DIAGNOSIS — Z20822 Contact with and (suspected) exposure to covid-19: Secondary | ICD-10-CM | POA: Diagnosis not present

## 2021-11-30 DIAGNOSIS — J90 Pleural effusion, not elsewhere classified: Secondary | ICD-10-CM | POA: Diagnosis not present

## 2021-11-30 DIAGNOSIS — Z7982 Long term (current) use of aspirin: Secondary | ICD-10-CM | POA: Diagnosis not present

## 2021-11-30 DIAGNOSIS — I2510 Atherosclerotic heart disease of native coronary artery without angina pectoris: Secondary | ICD-10-CM | POA: Insufficient documentation

## 2021-11-30 DIAGNOSIS — Z7984 Long term (current) use of oral hypoglycemic drugs: Secondary | ICD-10-CM | POA: Diagnosis not present

## 2021-11-30 DIAGNOSIS — Z87891 Personal history of nicotine dependence: Secondary | ICD-10-CM | POA: Diagnosis not present

## 2021-11-30 DIAGNOSIS — Z8546 Personal history of malignant neoplasm of prostate: Secondary | ICD-10-CM | POA: Diagnosis not present

## 2021-11-30 DIAGNOSIS — R0602 Shortness of breath: Secondary | ICD-10-CM | POA: Diagnosis not present

## 2021-11-30 DIAGNOSIS — M7989 Other specified soft tissue disorders: Secondary | ICD-10-CM | POA: Insufficient documentation

## 2021-11-30 DIAGNOSIS — I509 Heart failure, unspecified: Secondary | ICD-10-CM | POA: Insufficient documentation

## 2021-11-30 DIAGNOSIS — Z794 Long term (current) use of insulin: Secondary | ICD-10-CM | POA: Insufficient documentation

## 2021-11-30 DIAGNOSIS — R059 Cough, unspecified: Secondary | ICD-10-CM | POA: Insufficient documentation

## 2021-11-30 DIAGNOSIS — Z5321 Procedure and treatment not carried out due to patient leaving prior to being seen by health care provider: Secondary | ICD-10-CM | POA: Diagnosis not present

## 2021-11-30 DIAGNOSIS — J9811 Atelectasis: Secondary | ICD-10-CM | POA: Diagnosis not present

## 2021-11-30 DIAGNOSIS — R0600 Dyspnea, unspecified: Secondary | ICD-10-CM | POA: Diagnosis not present

## 2021-11-30 DIAGNOSIS — E119 Type 2 diabetes mellitus without complications: Secondary | ICD-10-CM | POA: Insufficient documentation

## 2021-11-30 DIAGNOSIS — Z79899 Other long term (current) drug therapy: Secondary | ICD-10-CM | POA: Insufficient documentation

## 2021-11-30 LAB — CBC
HCT: 35.7 % — ABNORMAL LOW (ref 39.0–52.0)
Hemoglobin: 11.4 g/dL — ABNORMAL LOW (ref 13.0–17.0)
MCH: 28.6 pg (ref 26.0–34.0)
MCHC: 31.9 g/dL (ref 30.0–36.0)
MCV: 89.7 fL (ref 80.0–100.0)
Platelets: 228 10*3/uL (ref 150–400)
RBC: 3.98 MIL/uL — ABNORMAL LOW (ref 4.22–5.81)
RDW: 16 % — ABNORMAL HIGH (ref 11.5–15.5)
WBC: 5.4 10*3/uL (ref 4.0–10.5)
nRBC: 0 % (ref 0.0–0.2)

## 2021-11-30 LAB — BASIC METABOLIC PANEL
Anion gap: 12 (ref 5–15)
BUN: 20 mg/dL (ref 8–23)
CO2: 24 mmol/L (ref 22–32)
Calcium: 9.1 mg/dL (ref 8.9–10.3)
Chloride: 107 mmol/L (ref 98–111)
Creatinine, Ser: 1.29 mg/dL — ABNORMAL HIGH (ref 0.61–1.24)
GFR, Estimated: 58 mL/min — ABNORMAL LOW (ref >60)
Glucose, Bld: 101 mg/dL — ABNORMAL HIGH (ref 70–99)
Potassium: 2.9 mmol/L — ABNORMAL LOW (ref 3.5–5.1)
Sodium: 143 mmol/L (ref 135–145)

## 2021-11-30 LAB — RESP PANEL BY RT-PCR (FLU A&B, COVID) ARPGX2
Influenza A by PCR: NEGATIVE
Influenza B by PCR: NEGATIVE
SARS Coronavirus 2 by RT PCR: NEGATIVE

## 2021-11-30 LAB — BRAIN NATRIURETIC PEPTIDE: B Natriuretic Peptide: 779.9 pg/mL — ABNORMAL HIGH (ref 0.0–100.0)

## 2021-11-30 LAB — PROTIME-INR
INR: 1.2 (ref 0.8–1.2)
Prothrombin Time: 14.7 seconds (ref 11.4–15.2)

## 2021-11-30 NOTE — ED Provider Notes (Addendum)
[facility] DEPT Provider Note   CSN: [id number] Arrival date & time: 11/30/21  1732     History  Chief Complaint  Patient presents with   Shortness of Breath    [name] is a 76 y.o. male.   Shortness of Breath Associated symptoms: no chest pain and no fever   Patient presents with shortness of breath.  Has had for the last couple weeks.  With URI symptoms but today feeling worse.  States more short of breath.  More difficulty walking around.  No chest pain.  No real swelling his legs.  Has had a cough for 2 weeks but states he is only had production with that one time.  No abdominal pain.  No known sick contacts.  Denies COPD history but does appear to have an albuterol inhaler.  Does have a history of CHF.  Not having chest pain.   Past Medical History:  Diagnosis Date   Arthritis    CAD (coronary artery disease)    Carotid artery occlusion    Diabetes mellitus    fasting 100-120s   Dizziness    Dysrhythmia    skips beats   GERD (gastroesophageal reflux disease)    History of kidney stones    Hyperlipidemia    Hypertension    IHD (ischemic heart disease)    Prior CABG in 2003   Normal nuclear stress test June 2012   Peripheral vascular disease ([place])    Prostate cancer ([place])    Torn rotator cuff     Home Medications Prior to Admission medications   Medication Sig Start Date End Date Taking? Authorizing Provider  albuterol (VENTOLIN HFA) 108 (90 Base) MCG/ACT inhaler Inhale 1 puff into the lungs as directed. 07/02/21   [provider]  amLODipine-benazepril (LOTREL) 10-40 MG capsule Take 1 capsule by mouth daily. 08/19/21   Nahser, Wonda Cheng, MD  aspirin EC 81 MG tablet Take 81 mg by mouth daily.    [provider]  carvedilol (COREG) 6.25 MG tablet Take 1 tablet (6.25 mg total) by mouth 2 (two) times daily. 09/30/21   Nahser, Wonda Cheng, MD  clopidogrel (PLAVIX) 75 MG tablet Take 1 tablet (75 mg total) by mouth daily.  07/08/21   Nahser, Wonda Cheng, MD  furosemide (LASIX) 40 MG tablet Take 1 tablet (40 mg total) by mouth daily. 07/08/21   Nahser, Wonda Cheng, MD  hydrALAZINE (APRESOLINE) 100 MG tablet Take 1 tablet (100 mg total) by mouth 3 (three) times daily. 11/28/21   Nahser, Wonda Cheng, MD  insulin NPH Human (NOVOLIN N) 100 UNIT/ML injection Inject 45 units [place] BID 09/08/19   [provider]  insulin regular (NOVOLIN R) 100 units/mL injection Inject up to 20 units [place] TID meals 09/08/19   [provider]  metFORMIN (GLUCOPHAGE-XR) 500 MG 24 hr tablet Take 500 mg by mouth 2 (two) times daily with a meal.  06/16/13   [provider]  nitroGLYCERIN (NITROSTAT) 0.4 MG SL tablet Place 1 tablet (0.4 mg total) under the tongue every 5 (five) minutes as needed for chest pain. 08/31/20   Nahser, Wonda Cheng, MD  pantoprazole (PROTONIX) 20 MG tablet Take 1 tablet (20 mg total) by mouth daily. 08/22/21   Nahser, Wonda Cheng, MD  rosuvastatin (CRESTOR) 20 MG tablet Take 1 tablet by mouth once daily 07/31/21   Nahser, Wonda Cheng, MD  spironolactone (ALDACTONE) 100 MG tablet Take 1 tablet (100 mg total) by mouth daily. 10/02/21   Nahser,  Wonda Cheng, MD      Allergies    Lipitor [atorvastatin calcium], Sulfa drugs cross reactors, and Codeine    Review of Systems   Review of Systems  Constitutional:  Positive for fatigue. Negative for fever.  HENT:  Negative for congestion.   Respiratory:  Positive for shortness of breath.   Cardiovascular:  Negative for chest pain and leg swelling.   Physical Exam Updated Vital Signs BP (!) 166/55    Pulse 79    Temp 98.1 F (36.7 C) (Oral)    Resp 17    Ht 5\' 11"  (1.803 m)    Wt 82.1 kg    SpO2 (S) 94%    BMI 25.24 kg/m  Physical Exam Vitals and nursing note reviewed.  Cardiovascular:     Rate and Rhythm: Normal rate and regular rhythm.  Pulmonary:     Breath sounds: No wheezing, rhonchi or rales.  Chest:     Chest wall: No tenderness.  Musculoskeletal:     Left lower  leg: No edema.  Skin:    General: Skin is warm.     Capillary Refill: Capillary refill takes less than 2 seconds.  Neurological:     Mental Status: He is alert and oriented to person, place, and time.    ED Results / Procedures / Treatments   Labs (all labs ordered are listed, but only abnormal results are displayed) Labs Reviewed  BASIC METABOLIC PANEL - Abnormal; Notable for the following components:      Result Value   Potassium 2.9 (*)    Glucose, Bld 101 (*)    Creatinine, Ser 1.29 (*)    GFR, Estimated 58 (*)    All other components within normal limits  CBC - Abnormal; Notable for the following components:   RBC 3.98 (*)    Hemoglobin 11.4 (*)    HCT 35.7 (*)    RDW 16.0 (*)    All other components within normal limits  BRAIN NATRIURETIC PEPTIDE - Abnormal; Notable for the following components:   B Natriuretic Peptide 779.9 (*)    All other components within normal limits  RESP PANEL BY RT-PCR (FLU A&B, COVID) ARPGX2  PROTIME-INR    EKG None  Radiology DG Chest 2 View  Result Date: 11/30/2021 CLINICAL DATA:  Dyspnea EXAM: CHEST - 2 VIEW COMPARISON:  06/15/2021 FINDINGS: Pulmonary insufflation has slightly diminished since prior examination, however, insufflation is normal and symmetric. Small bilateral pleural effusions have developed. Mild bibasilar atelectasis. No pneumothorax. Coronary artery bypass grafting has been performed. Cardiac size within normal limits. Pulmonary vascularity is normal. IMPRESSION: Small bilateral pleural effusions with associated minimal bibasilar atelectasis. Electronically Signed   By: Fidela Salisbury M.D.   On: 11/30/2021 18:59    Procedures Procedures    Medications Ordered in ED Medications - No data to display  ED Course/ Medical Decision Making/ A&P                           Medical Decision Making Amount and/or Complexity of Data Reviewed Labs: ordered. Radiology: ordered.  Initial differential diagnosis for shortness of  breath includes a broad differential diagnosis includes life-threatening condition such as pneumothorax, pneumonia, pulmonary embolism. Patient presents with shortness of breath.  History of CHF.  For around 3 weeks has had cough.  Occasional sputum production but states really only 1 time.  Some mild swelling in his legs but states is better than normal.  X-ray viewed  and independently interpreted.  Showed mild pleural effusion.  Troponin reassuring.  BNP elevated.  However patient will have desaturations.  Former smoker but denies COPD history.  Particularly will drop his sats if he sleeps.  Requires couple liters nasal cannula.  With URI symptoms and hypoxia will admit to hospitalist.  EKG showed some PVCs but otherwise similar to prior.  Will discuss with hospitalist I have reviewed previous cardiology notes.  COVID and flu testing still pending.  Pulmonary embolism felt less likely.  Patient no longer willing to be admitted.  Aware of risks.  We will follow-up with PCP.  Leaving AMA        Final Clinical Impression(s) / ED Diagnoses Final diagnoses:  SOB (shortness of breath)  Hypoxia    Rx / DC Orders ED Discharge Orders     None         Davonna Belling, MD 11/30/21 2395    Davonna Belling, MD 11/30/21 [phone]

## 2021-11-30 NOTE — Discharge Instructions (Addendum)
You are leaving against our advice.  Follow-up with Dr. Tammi Sou as soon as possible.  You can return at any time for further treatment.

## 2021-11-30 NOTE — ED Notes (Signed)
Patient transported to X-ray 

## 2021-11-30 NOTE — ED Triage Notes (Signed)
Pt via pov from home with sob today. Pt has hx of chf; has had uri symptoms for a couple of weeks and has been treating the symptoms. Pt alert & breathng heavily during triage.

## 2021-12-02 ENCOUNTER — Encounter: Payer: Self-pay | Admitting: Cardiovascular Disease

## 2021-12-02 DIAGNOSIS — E876 Hypokalemia: Secondary | ICD-10-CM

## 2021-12-04 MED ORDER — POTASSIUM CHLORIDE CRYS ER 20 MEQ PO TBCR: 40.0000 meq | EXTENDED_RELEASE_TABLET | Freq: Every day | ORAL | 0 refills | Status: DC

## 2021-12-04 NOTE — Telephone Encounter (Signed)
Recommendations from [facility], Pharm D- would add on Kcl 17meq daily until his appt on 2/15 and can recheck then and see if he needs to continue

## 2021-12-10 ENCOUNTER — Encounter: Payer: Self-pay | Admitting: Cardiovascular Disease

## 2021-12-10 NOTE — Progress Notes (Signed)
Cardiology Office Note   Date:  12/11/2021   ID:  [name] 10-13-1946, MRN [id number]  PCP:  Haywood Pao, MD  Cardiologist: previously Darlin Coco MD , now Mertie Moores, MD   Chief Complaint  Patient presents with   Congestive Heart Failure        Coronary Artery Disease   Problem list 1. Coronary artery disease-status post CABG 2003 2. Hyperlipidemia 3. Carotid artery disease-status post left carotid stenting , s/p Right CEA  4. Essential hypertension 5. Diabetes mellitus     [name] is a 76 y.o. male who presents for a six-month follow-up visit  Notes from Dr. Mare Ferrari: . He has a past history of coronary artery disease and had coronary artery bypass graft surgery in 2003. He has a remote history of a right carotid endarterectomy about 18 years ago.  He had a left carotid endarterectomy by Dr. Oneida Alar in 2014. He has a history of high blood pressure, diabetes mellitus, and hyperlipidemia. He had a normal nuclear stress test in June 2012 prior to rotator cuff surgery.  The patient developed claudication of his right leg  and underwent successful vascular surgery in February 2015.   Since last visit he has been doing well with no chest pain or shortness of breath. Since last visit he has retired and has been enjoying retirement.  He is no longer experiencing any right calf claudication.  He has diabetes followed by Dr. Tamala Julian at [facility].  His most recent A1c was 7.2 Since last visit the patient has had no new cardiac symptoms.   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  He himself lives in a townhouse.    Aug. 18, 2017:  [name] is seen today for the first time. He's a previous patient of Dr. Mare Ferrari. He is followed for coronary artery disease, hyperlipidemia, carotid artery disease, hypertension.  Doing well.   Stays active.   Does lots of woodworking . Walks often    No angina   Feb. 14, 2018; Doing well.  No CP or dyspnea. Not exercising much ,  Avoids salt.   Oct. 1, 2018:  Here for eval of HTN. Still eating some salty food . Avoids hot dogs now .  stopped smoking years ago. As active as he can be,  Has a heel spur  February 23, 2018:  BP hyas been normal blood pressure is a bit high Is avoiding hot dogs  No CP or dyspnea.  Has a left carotid stent, s/p R CEA   August 24, 2018: [name]  is seen today for follow-up of his coronary artery disease, hypertension and carotid artery disease. Staying active,  Works in the garden,  Works in his woodworking shop  September 06, 2020: [name] is seen today for a follow-up of his coronary artery disease, coronary artery bypass grafting and carotid artery disease. Has prostate cancer,  Getting XRT.  Still doing some wood working.   Staying active  Has not felt well.   Sept. 12. 2022: [name] is seen today for follow up of his CAD, , CABG, carotid artery disease, HLd He was recently seen in the ER on Aug. 20, 2022  for leg swelling  He was given Lasix 40 mg BID for 3 days and then 40 mg a day Also was started on /kdur 10 meq BID x 3 days, followed by  [employer]  10 meq a day  Had lots of abdominal swelling  Leg swelling is better.   Is wearing compression hose.   We will draw basic metabolic profile today to assess his renal function and potassium after being on Lasix and potassium for several weeks.  His daughter typed out a sheet of questions for me to answer. I informed her that he has class II-III congestive heart failure.  His ejection fraction is 60 to 65%.  He has grade 2 diastolic dysfunction. Was also found to have a old inferior wall myocardial infarction that likely occurred before his CABG.  Concerning when the family should call, if he gains 3 pounds in the course of 1 day or 5 pounds in the course of the week they should call to get further advice.  He will continue to wear his  compression hose daily if possible.  If not daily then he should wear them as much as possible.  He is leg edema is gone and I do not think there is any role and drawn at brain natruretic peptide at this time.  He has some underlying renal insufficiency so a BNP is not going to be particularly useful.  He will return to see me in 3 months with a basic metabolic profile  September 30, 2021:  [name] is seen today for follow-up of his coronary artery disease,  HLD, hypertension, diabetes mellitus. Chf symptoms    Labs from Nov. 15, 2022 look stable  Complains of constipation   Feb. 15, 2023 [name] is seen today for follow up of CAD , HLD, HTN, DM, CHF Was getting dizzy,  he started taking hydralazine 1/2 tab ( 50 mg ) TID  Has had some URI symptoms Was seen in the ER  Is more short of breath  Has been feeling better .    Stopped smoking years ago  Has been taking Kdur 40 meq a day instead of 20 meq ( K was 2.9 in the ER on Feb. 4)   Past Medical History:  Diagnosis Date   Arthritis    CAD (coronary artery disease)    Carotid artery occlusion    Diabetes mellitus    fasting 100-120s   Dizziness    Dysrhythmia    skips beats   GERD (gastroesophageal reflux disease)    History of kidney stones    Hyperlipidemia    Hypertension    IHD (ischemic heart disease)    Prior CABG in 2003   Normal nuclear stress test June 2012   Peripheral vascular disease ([place])    Prostate cancer ([place])    Torn rotator cuff     Past Surgical History:  Procedure Laterality Date   ABDOMINAL AORTAGRAM N/A 12/21/2013   Procedure: ABDOMINAL Maxcine Ham;  Surgeon: Elam Dutch, MD;  Location: [facility] CATH LAB;  Service: Cardiovascular;  Laterality: N/A;   ABDOMINAL AORTOGRAM N/A 11/13/2017   Procedure: ABDOMINAL AORTOGRAM;  Surgeon: Elam Dutch, MD;  Location: [place] CV LAB;  Service: Cardiovascular;  Laterality: N/A;   APPENDECTOMY     ARCH AORTOGRAM  08/26/2013   Procedure: ARCH AORTOGRAM;   Surgeon: Elam Dutch, MD;  Location: [facility] CATH LAB;  Service: Cardiovascular;;   CARDIAC CATHETERIZATION  05/03/2002   EF 40-45%   CARDIOVASCULAR STRESS TEST  08/10/2006   EF 65%   CAROTID ENDARTERECTOMY     CAROTID STENT INSERTION Left 08/26/2013   Procedure: CAROTID STENT INSERTION;  Surgeon: Elam Dutch, MD;  Location: [facility] CATH LAB;  Service: Cardiovascular;  Laterality: Left;  internal carotid   CORONARY ARTERY BYPASS GRAFT  04/2002   DOPPLER ECHOCARDIOGRAPHY  08/12/2002   EF 80-85%   ENDARTERECTOMY Left 07/20/2013   Procedure: ATTEMPTED ENDARTERECTOMY CAROTID;  Surgeon: Conrad Fredericksburg, MD;  Location: [place];  Service: Vascular;  Laterality: Left;   HERNIA REPAIR     LOWER EXTREMITY ANGIOGRAPHY  11/13/2017   Procedure: Lower Extremity Angiography;  Surgeon: Elam Dutch, MD;  Location: [place] CV LAB;  Service: Cardiovascular;;   PERIPHERAL VASCULAR INTERVENTION Right 11/13/2017   Procedure: PERIPHERAL VASCULAR INTERVENTION;  Surgeon: Elam Dutch, MD;  Location: [place] CV LAB;  Service: Cardiovascular;  Laterality: Right;  superficial femoral   SHOULDER SURGERY       Current Outpatient Medications  Medication Sig Dispense Refill   albuterol (VENTOLIN HFA) 108 (90 Base) MCG/ACT inhaler Inhale 1 puff into the lungs as directed.     amLODipine-benazepril (LOTREL) 10-40 MG capsule Take 1 capsule by mouth daily. 90 capsule 1   aspirin EC 81 MG tablet Take 81 mg by mouth daily.     carvedilol (COREG) 6.25 MG tablet Take 1 tablet (6.25 mg total) by mouth 2 (two) times daily. 180 tablet 3   clopidogrel (PLAVIX) 75 MG tablet Take 1 tablet (75 mg total) by mouth daily. 90 tablet 3   furosemide (LASIX) 40 MG tablet Take 1 tablet (40 mg total) by mouth daily. 90 tablet 3   hydrALAZINE (APRESOLINE) 100 MG tablet Take 1 tablet (100 mg total) by mouth 3 (three) times daily. (Patient taking differently: Take 50 mg by mouth 3 (three) times daily.) 90 tablet 11   insulin NPH Human  (NOVOLIN N) 100 UNIT/ML injection Inject 45 Units into the skin 2 (two) times daily before a meal.     insulin regular (NOVOLIN R) 100 units/mL injection as directed.     metFORMIN (GLUCOPHAGE-XR) 500 MG 24 hr tablet Take 500 mg by mouth 2 (two) times daily with a meal.      nitroGLYCERIN (NITROSTAT) 0.4 MG SL tablet Place 1 tablet (0.4 mg total) under the tongue every 5 (five) minutes as needed for chest pain. 25 tablet 6   pantoprazole (PROTONIX) 20 MG tablet Take 1 tablet (20 mg total) by mouth daily. 90 tablet 3   potassium chloride SA (KLOR-CON M) 20 MEQ tablet Take 2 tablets (40 mEq total) by mouth daily. 30 tablet 0   rosuvastatin (CRESTOR) 20 MG tablet Take 1 tablet by mouth once daily 90 tablet 3   spironolactone (ALDACTONE) 100 MG tablet Take 1 tablet (100 mg total) by mouth daily. 90 tablet 3   No current facility-administered medications for this visit.    Allergies:   Lipitor [atorvastatin calcium], Sulfa antibiotics, Sulfa drugs cross reactors, Sulfamethoxazole-trimethoprim, and Codeine    Social History:  The patient  reports that he quit smoking about 20 years ago. His smoking use included cigarettes. He has a 20.00 pack-year smoking history. He has never used smokeless tobacco. He reports that he does not drink alcohol and does not use drugs.   Family History:  The patient's family history includes Diabetes in his brother, daughter, mother, sister, and sister; Heart attack in his father and mother; Heart disease in his daughter, father, and mother; Hypertension in his daughter, father, and mother.    ROS:  Please see the history of present illness.   Otherwise, review of systems are positive for none.   All other systems are reviewed and  negative.    Physical Exam: Blood pressure (!) 136/58, pulse 75, height 5\' 11"  (1.803 m), weight 175 lb 12.8 oz (79.7 kg), SpO2 94 %.  GEN:  Well nourished, well developed in no acute distress HEENT: Normal NECK: No JVD; Bilateral carotid  bruits.  LYMPHATICS: No lymphadenopathy CARDIAC: RRR   RESPIRATORY:  Clear to auscultation without rales, wheezing or rhonchi  ABDOMEN: Soft, non-tender, non-distended MUSCULOSKELETAL:  No edema; No deformity  SKIN: Warm and dry NEUROLOGIC:  Alert and oriented x 3   EKG:      NSR at 75.  TWI in II, III, AvF, V4-V6 No changes from previous ecg   Recent Labs: 06/15/2021: ALT 14 11/30/2021: B Natriuretic Peptide 779.9; BUN 20; Creatinine, Ser 1.29; Hemoglobin 11.4; Platelets 228; Potassium 2.9; Sodium 143    Lipid Panel    Component Value Date/Time   CHOL 132 08/24/2018 0825   TRIG 149 08/24/2018 0825   HDL 34 (L) 08/24/2018 0825   CHOLHDL 3.9 08/24/2018 0825   CHOLHDL 3.2 06/13/2016 0918   VLDL 21 06/13/2016 0918   LDLCALC 68 08/24/2018 0825    Wt Readings from Last 3 Encounters:  12/11/21 175 lb 12.8 oz (79.7 kg)  11/30/21 181 lb (82.1 kg)  11/11/21 184 lb 9.6 oz (83.7 kg)      ASSESSMENT AND PLAN:  1.   CAD :     no angina .     2. diabetes mellitus insulin-      3.  Acute on chronic diastolic congestive heart failure:  Seems to be doing well .   He has been on lasix and Kdur 20 meq a day . K on Feb. 4 was 2.9.   his Kdur was increased to 40 meq a day until today . Will recheck today and make recommendations on his Kdur dose from today     4. Hyperlipidemia.   -  labs are stable   6. Bilateral carotid artery disease:   -   Stable    Current medicines are reviewed at length with the patient today.  The patient does not have concerns regarding medicines.  The following changes have been made:  no change  Labs/ tests ordered today include:   Orders Placed This Encounter  Procedures   EKG 12-Lead        Mertie Moores, MD  12/11/2021 2:01 PM    [facility] [address],  [place], [place]  [address] Pager [phone] Phone: [phone]; Fax: [phone]

## 2021-12-11 ENCOUNTER — Ambulatory Visit: Payer: Medicare HMO | Admitting: Cardiovascular Disease

## 2021-12-11 ENCOUNTER — Other Ambulatory Visit: Payer: Medicare HMO | Admitting: *Deleted

## 2021-12-11 ENCOUNTER — Encounter: Payer: Self-pay | Admitting: Cardiovascular Disease

## 2021-12-11 ENCOUNTER — Other Ambulatory Visit: Payer: Self-pay

## 2021-12-11 VITALS — BP 136/58 | HR 75 | Ht 71.0 in | Wt 175.8 lb

## 2021-12-11 DIAGNOSIS — I2510 Atherosclerotic heart disease of native coronary artery without angina pectoris: Secondary | ICD-10-CM | POA: Diagnosis not present

## 2021-12-11 DIAGNOSIS — I10 Essential (primary) hypertension: Secondary | ICD-10-CM | POA: Diagnosis not present

## 2021-12-11 DIAGNOSIS — E876 Hypokalemia: Secondary | ICD-10-CM

## 2021-12-11 DIAGNOSIS — Z79899 Other long term (current) drug therapy: Secondary | ICD-10-CM

## 2021-12-11 NOTE — Patient Instructions (Signed)
Medication Instructions:  The current medical regimen is effective;  continue present plan and medications.  *If you need a refill on your cardiac medications before your next appointment, please call your pharmacy*   Lab Work: Please have BMP as previously ordered.  If you have labs (blood work) drawn today and your tests are completely normal, you will receive your results only by: [place] (if you have MyChart) OR A paper copy in the mail If you have any lab test that is abnormal or we need to change your treatment, we will call you to review the results.  Follow-Up: At [facility], you and your health needs are our priority.  As part of our continuing mission to provide you with exceptional heart care, we have created designated Provider Care Teams.  These Care Teams include your primary Cardiologist (physician) and Advanced Practice Providers (APPs -  Physician Assistants and Nurse Practitioners) who all work together to provide you with the care you need, when you need it.  We recommend signing up for the patient portal called "MyChart".  Sign up information is provided on this After Visit Summary.  MyChart is used to connect with patients for Virtual Visits (Telemedicine).  Patients are able to view lab/test results, encounter notes, upcoming appointments, etc.  Non-urgent messages can be sent to your provider as well.   To learn more about what you can do with MyChart, go to [email or web address].    Your next appointment:   As scheduled  Thank you for choosing [facility]!!

## 2021-12-12 LAB — BASIC METABOLIC PANEL
BUN/Creatinine Ratio: 19 (ref 10–24)
BUN: 25 mg/dL (ref 8–27)
CO2: 20 mmol/L (ref 20–29)
Calcium: 9.3 mg/dL (ref 8.6–10.2)
Chloride: 106 mmol/L (ref 96–106)
Creatinine, Ser: 1.3 mg/dL — ABNORMAL HIGH (ref 0.76–1.27)
Glucose: 136 mg/dL — ABNORMAL HIGH (ref 70–99)
Potassium: 4.5 mmol/L (ref 3.5–5.2)
Sodium: 141 mmol/L (ref 134–144)
eGFR: 58 mL/min/{1.73_m2} — ABNORMAL LOW (ref >59)

## 2021-12-19 ENCOUNTER — Telehealth: Payer: Self-pay | Admitting: Pharmacist

## 2021-12-19 MED ORDER — HYDRALAZINE HCL 50 MG PO TABS: 50.0000 mg | ORAL_TABLET | Freq: Three times a day (TID) | ORAL | 3 refills | Status: DC

## 2021-12-19 NOTE — Telephone Encounter (Signed)
Called pt to follow up with home BP readings. I increased his hydralazine from 50mg  to 100mg  TID on 11/28/21 given home SBP elevations in the 140s-160s.  Saw Dr Acie Fredrickson on 2/15. Reported dizziness and that he self-decreased hydralazine dose back to 50mg  TID. Med list still states 100mg  TID though.  Called pt to clarify. Still taking 50mg  TID, dizziness has resolved on lower dose, reports SBP in the 120s at home on a pretty regular basis. Will continue lower dose of hydralazine 50mg  TID. Updated prescription has been sent in/med list updated.

## 2021-12-24 DIAGNOSIS — Z0000 Encounter for general adult medical examination without abnormal findings: Secondary | ICD-10-CM | POA: Diagnosis not present

## 2021-12-24 DIAGNOSIS — E1151 Type 2 diabetes mellitus with diabetic peripheral angiopathy without gangrene: Secondary | ICD-10-CM | POA: Diagnosis not present

## 2021-12-24 DIAGNOSIS — E7800 Pure hypercholesterolemia, unspecified: Secondary | ICD-10-CM | POA: Diagnosis not present

## 2021-12-25 DIAGNOSIS — I119 Hypertensive heart disease without heart failure: Secondary | ICD-10-CM | POA: Diagnosis not present

## 2021-12-25 DIAGNOSIS — Z1339 Encounter for screening examination for other mental health and behavioral disorders: Secondary | ICD-10-CM | POA: Diagnosis not present

## 2021-12-25 DIAGNOSIS — I509 Heart failure, unspecified: Secondary | ICD-10-CM | POA: Diagnosis not present

## 2021-12-25 DIAGNOSIS — C61 Malignant neoplasm of prostate: Secondary | ICD-10-CM | POA: Diagnosis not present

## 2021-12-25 DIAGNOSIS — Z0000 Encounter for general adult medical examination without abnormal findings: Secondary | ICD-10-CM | POA: Diagnosis not present

## 2021-12-25 DIAGNOSIS — E1151 Type 2 diabetes mellitus with diabetic peripheral angiopathy without gangrene: Secondary | ICD-10-CM | POA: Diagnosis not present

## 2021-12-25 DIAGNOSIS — Z1331 Encounter for screening for depression: Secondary | ICD-10-CM | POA: Diagnosis not present

## 2021-12-25 DIAGNOSIS — R0609 Other forms of dyspnea: Secondary | ICD-10-CM | POA: Diagnosis not present

## 2021-12-25 DIAGNOSIS — K2270 Barrett's esophagus without dysplasia: Secondary | ICD-10-CM | POA: Diagnosis not present

## 2021-12-25 DIAGNOSIS — I739 Peripheral vascular disease, unspecified: Secondary | ICD-10-CM | POA: Diagnosis not present

## 2021-12-25 DIAGNOSIS — I6529 Occlusion and stenosis of unspecified carotid artery: Secondary | ICD-10-CM | POA: Diagnosis not present

## 2021-12-25 DIAGNOSIS — I25810 Atherosclerosis of coronary artery bypass graft(s) without angina pectoris: Secondary | ICD-10-CM | POA: Diagnosis not present

## 2022-01-07 DIAGNOSIS — C61 Malignant neoplasm of prostate: Secondary | ICD-10-CM | POA: Diagnosis not present

## 2022-01-14 DIAGNOSIS — N5201 Erectile dysfunction due to arterial insufficiency: Secondary | ICD-10-CM | POA: Diagnosis not present

## 2022-01-14 DIAGNOSIS — Z8546 Personal history of malignant neoplasm of prostate: Secondary | ICD-10-CM | POA: Diagnosis not present

## 2022-01-17 DIAGNOSIS — E1140 Type 2 diabetes mellitus with diabetic neuropathy, unspecified: Secondary | ICD-10-CM | POA: Diagnosis not present

## 2022-01-17 DIAGNOSIS — Z794 Long term (current) use of insulin: Secondary | ICD-10-CM | POA: Diagnosis not present

## 2022-01-17 DIAGNOSIS — Z7984 Long term (current) use of oral hypoglycemic drugs: Secondary | ICD-10-CM | POA: Diagnosis not present

## 2022-01-17 DIAGNOSIS — E785 Hyperlipidemia, unspecified: Secondary | ICD-10-CM | POA: Diagnosis not present

## 2022-01-17 DIAGNOSIS — I10 Essential (primary) hypertension: Secondary | ICD-10-CM | POA: Diagnosis not present

## 2022-01-23 DIAGNOSIS — Z8546 Personal history of malignant neoplasm of prostate: Secondary | ICD-10-CM | POA: Diagnosis not present

## 2022-01-23 DIAGNOSIS — N62 Hypertrophy of breast: Secondary | ICD-10-CM | POA: Diagnosis not present

## 2022-02-13 ENCOUNTER — Telehealth: Payer: Self-pay

## 2022-02-13 ENCOUNTER — Other Ambulatory Visit: Payer: Self-pay

## 2022-02-13 DIAGNOSIS — I739 Peripheral vascular disease, unspecified: Secondary | ICD-10-CM

## 2022-02-13 DIAGNOSIS — I6523 Occlusion and stenosis of bilateral carotid arteries: Secondary | ICD-10-CM

## 2022-02-13 NOTE — Telephone Encounter (Signed)
Pt called to schedule his f/u and is c/o new onset of LLE claudication for the last week or so. He denies swelling or color change to the skin. He has been scheduled for studies and APP appt. He is aware of these and has no further questions at this time.  ?

## 2022-02-19 ENCOUNTER — Other Ambulatory Visit: Payer: Self-pay | Admitting: Cardiovascular Disease

## 2022-02-19 NOTE — Progress Notes (Signed)
?Office Note  ? ? ? ?CC:  follow up ?Requesting Provider:  Haywood Pao, MD ? ?HPI: [name] is a 76 y.o. (May 01, 1946) male who presents carotid artery stenosis and PAD. He has remote history of left ICA stent by Dr. Oneida Alar on 08/26/13. He has no history of TIA or stroke. Today he denies amaurosis fugax or visual changes. He says he actually feels like his eye sight is getting better. He denies any slurred speech, facial drooping, unilateral upper or lower extremity weakness or numbness ? ?He also has history of right SFA stent 12/21/13 for CLI with non healing wound. He did have to have Angiogram of the RLE in 2019 for in stent restenosis. He was last seen in June of 2022 by Dr. Oneida Alar, at which time he did not have any lower extremity claudication, rest pain or tissue loss despite ABI's having decreased to 0.5 bilaterally. He did recently called the office complaining of some claudication symptoms. He explains that he gets cramping in his calves on ambulation. He says he can walk around the entire [place] before this occurs. It is improved with rest. The left is worse than the right. He has no pain at rest. No non healing wounds. He usually wears compression stockings daily with intermittent swelling. ? ?The pt is on a statin for cholesterol management ?The pt is on a daily aspirin.   Other AC:  Plavix ?The pt is on CCB, BB for hypertension.   ?The pt is diabetic.   ?Tobacco hx:  former ? ?Past Medical History:  ?Diagnosis Date  ? Arthritis   ? CAD (coronary artery disease)   ? Carotid artery occlusion   ? Diabetes mellitus   ? fasting 100-120s  ? Dizziness   ? Dysrhythmia   ? skips beats  ? GERD (gastroesophageal reflux disease)   ? History of kidney stones   ? Hyperlipidemia   ? Hypertension   ? IHD (ischemic heart disease)   ? Prior CABG in 2003  ? Normal nuclear stress test June 2012  ? Peripheral vascular disease ([place])   ? Prostate cancer ([place])   ? Torn rotator cuff   ? ? ?Past Surgical History:   ?Procedure Laterality Date  ? ABDOMINAL AORTAGRAM N/A 12/21/2013  ? Procedure: ABDOMINAL AORTAGRAM;  Surgeon: Elam Dutch, MD;  Location: [facility] CATH LAB;  Service: Cardiovascular;  Laterality: N/A;  ? ABDOMINAL AORTOGRAM N/A 11/13/2017  ? Procedure: ABDOMINAL AORTOGRAM;  Surgeon: Elam Dutch, MD;  Location: [place] CV LAB;  Service: Cardiovascular;  Laterality: N/A;  ? APPENDECTOMY    ? ARCH AORTOGRAM  08/26/2013  ? Procedure: ARCH AORTOGRAM;  Surgeon: Elam Dutch, MD;  Location: [facility] CATH LAB;  Service: Cardiovascular;;  ? CARDIAC CATHETERIZATION  05/03/2002  ? EF 40-45%  ? CARDIOVASCULAR STRESS TEST  08/10/2006  ? EF 65%  ? CAROTID ENDARTERECTOMY    ? CAROTID STENT INSERTION Left 08/26/2013  ? Procedure: CAROTID STENT INSERTION;  Surgeon: Elam Dutch, MD;  Location: [facility] CATH LAB;  Service: Cardiovascular;  Laterality: Left;  internal carotid  ? CORONARY ARTERY BYPASS GRAFT  04/2002  ? DOPPLER ECHOCARDIOGRAPHY  08/12/2002  ? EF 80-85%  ? ENDARTERECTOMY Left 07/20/2013  ? Procedure: ATTEMPTED ENDARTERECTOMY CAROTID;  Surgeon: Conrad Saunders, MD;  Location: [place];  Service: Vascular;  Laterality: Left;  ? HERNIA REPAIR    ? LOWER EXTREMITY ANGIOGRAPHY  11/13/2017  ? Procedure: Lower Extremity Angiography;  Surgeon: Elam Dutch, MD;  Location: [place] CV LAB;  Service: Cardiovascular;;  ? PERIPHERAL VASCULAR INTERVENTION Right 11/13/2017  ? Procedure: PERIPHERAL VASCULAR INTERVENTION;  Surgeon: Elam Dutch, MD;  Location: [place] CV LAB;  Service: Cardiovascular;  Laterality: Right;  superficial femoral  ? SHOULDER SURGERY    ? ? ?Social History  ? ?Socioeconomic History  ? Marital status: Married  ?  Spouse name: Not on file  ? Number of children: 2  ? Years of education: Not on file  ? Highest education level: Not on file  ?Occupational History  ? Not on file  ?Tobacco Use  ? Smoking status: Former  ?  Packs/day: 1.00  ?  Years: 20.00  ?  Pack years: 20.00  ?  Types: Cigarettes  ?  Quit  date: 10/27/2001  ?  Years since quitting: 20.3  ?  Passive exposure: Never  ? Smokeless tobacco: Never  ?Vaping Use  ? Vaping Use: Never used  ?Substance and Sexual Activity  ? Alcohol use: No  ?  Alcohol/week: 0.0 standard drinks  ? Drug use: No  ? Sexual activity: Not Currently  ?Other Topics Concern  ? Not on file  ?Social History Narrative  ? Not on file  ? ?Social Determinants of Health  ? ?Financial Resource Strain: Not on file  ?Food Insecurity: Not on file  ?Transportation Needs: Not on file  ?Physical Activity: Not on file  ?Stress: Not on file  ?Social Connections: Not on file  ?Intimate Partner Violence: Not on file  ? ? ?Family History  ?Problem Relation Age of Onset  ? Heart attack Mother   ? Diabetes Mother   ? Heart disease Mother   ? Hypertension Mother   ? Heart attack Father   ? Heart disease Father   ? Hypertension Father   ? Diabetes Brother   ? Diabetes Sister   ? Diabetes Daughter   ? Heart disease Daughter   ? Hypertension Daughter   ? Diabetes Sister   ? Breast cancer Neg Hx   ? Prostate cancer Neg Hx   ? Colon cancer Neg Hx   ? Pancreatic cancer Neg Hx   ? ? ?Current Outpatient Medications  ?Medication Sig Dispense Refill  ? albuterol (VENTOLIN HFA) 108 (90 Base) MCG/ACT inhaler Inhale 1 puff into the lungs as directed.    ? amLODipine-benazepril (LOTREL) 10-40 MG capsule Take 1 capsule by mouth once daily 90 capsule 3  ? aspirin EC 81 MG tablet Take 81 mg by mouth daily.    ? carvedilol (COREG) 6.25 MG tablet Take 1 tablet (6.25 mg total) by mouth 2 (two) times daily. 180 tablet 3  ? clopidogrel (PLAVIX) 75 MG tablet Take 1 tablet (75 mg total) by mouth daily. 90 tablet 3  ? furosemide (LASIX) 40 MG tablet Take 1 tablet (40 mg total) by mouth daily. 90 tablet 3  ? hydrALAZINE (APRESOLINE) 50 MG tablet Take 1 tablet (50 mg total) by mouth 3 (three) times daily. 270 tablet 3  ? insulin NPH Human (NOVOLIN N) 100 UNIT/ML injection Inject 45 Units into the skin 2 (two) times daily before a meal.     ? insulin regular (NOVOLIN R) 100 units/mL injection as directed.    ? metFORMIN (GLUCOPHAGE-XR) 500 MG 24 hr tablet Take 500 mg by mouth 2 (two) times daily with a meal.     ? nitroGLYCERIN (NITROSTAT) 0.4 MG SL tablet Place 1 tablet (0.4 mg total) under the tongue every 5 (five) minutes as needed for  chest pain. 25 tablet 6  ? pantoprazole (PROTONIX) 20 MG tablet Take 1 tablet (20 mg total) by mouth daily. 90 tablet 3  ? potassium chloride SA (KLOR-CON M) 20 MEQ tablet Take 2 tablets (40 mEq total) by mouth daily. 30 tablet 0  ? rosuvastatin (CRESTOR) 20 MG tablet Take 1 tablet by mouth once daily 90 tablet 3  ? spironolactone (ALDACTONE) 100 MG tablet Take 1 tablet (100 mg total) by mouth daily. 90 tablet 3  ? ?No current facility-administered medications for this visit.  ? ? ?Allergies  ?Allergen Reactions  ? Lipitor [Atorvastatin Calcium] Other (See Comments)  ?  Muscle ache  ? Sulfa Antibiotics   ?  Other reaction(s): Unknown  ? Sulfa Drugs Cross Reactors Other (See Comments)  ?  "raw spots"  ? Sulfamethoxazole-Trimethoprim   ?  Other reaction(s): Rash  ? Codeine Rash  ? ? ? ?REVIEW OF SYSTEMS:  ? ?'[X]'$  denotes positive finding, '[ ]'$  denotes negative finding ?Cardiac  Comments:  ?Chest pain or chest pressure:    ?Shortness of breath upon exertion:    ?Short of breath when lying flat:    ?Irregular heart rhythm:    ?    ?Vascular    ?Pain in calf, thigh, or hip brought on by ambulation:    ?Pain in feet at night that wakes you up from your sleep:     ?Blood clot in your veins:    ?Leg swelling:  X   ?    ?Pulmonary    ?Oxygen at home:    ?Productive cough:     ?Wheezing:     ?    ?Neurologic    ?Sudden weakness in arms or legs:     ?Sudden numbness in arms or legs:     ?Sudden onset of difficulty speaking or slurred speech:    ?Temporary loss of vision in one eye:     ?Problems with dizziness:     ?    ?Gastrointestinal    ?Blood in stool:     ?Vomited blood:     ?    ?Genitourinary    ?Burning when urinating:      ?Blood in urine:    ?    ?Psychiatric    ?Major depression:     ?    ?Hematologic    ?Bleeding problems:    ?Problems with blood clotting too easily:    ?    ?Skin    ?Rashes or ulcers:    ?    ?Co

## 2022-02-20 ENCOUNTER — Ambulatory Visit (HOSPITAL_COMMUNITY)
Admission: RE | Admit: 2022-02-20 | Discharge: 2022-02-20 | Disposition: A | Discharge status: Home / Self Care | Payer: Medicare HMO | Source: Ambulatory Visit | Attending: Physician Assistant | Admitting: Physician Assistant

## 2022-02-20 ENCOUNTER — Ambulatory Visit (INDEPENDENT_AMBULATORY_CARE_PROVIDER_SITE_OTHER)
Admission: RE | Admit: 2022-02-20 | Discharge: 2022-02-20 | Disposition: A | Discharge status: Home / Self Care | Payer: Medicare HMO | Source: Ambulatory Visit | Attending: Physician Assistant | Admitting: Physician Assistant

## 2022-02-20 DIAGNOSIS — I6523 Occlusion and stenosis of bilateral carotid arteries: Secondary | ICD-10-CM | POA: Diagnosis not present

## 2022-02-20 DIAGNOSIS — I739 Peripheral vascular disease, unspecified: Secondary | ICD-10-CM | POA: Diagnosis not present

## 2022-02-24 DIAGNOSIS — E1142 Type 2 diabetes mellitus with diabetic polyneuropathy: Secondary | ICD-10-CM | POA: Diagnosis not present

## 2022-02-25 DIAGNOSIS — E1142 Type 2 diabetes mellitus with diabetic polyneuropathy: Secondary | ICD-10-CM | POA: Diagnosis not present

## 2022-02-26 ENCOUNTER — Ambulatory Visit: Payer: Medicare HMO | Admitting: Physician Assistant

## 2022-02-26 VITALS — BP 133/60 | HR 61 | Temp 97.4°F | Resp 20 | Ht 71.0 in | Wt 180.6 lb

## 2022-02-26 DIAGNOSIS — I6523 Occlusion and stenosis of bilateral carotid arteries: Secondary | ICD-10-CM

## 2022-02-26 DIAGNOSIS — I739 Peripheral vascular disease, unspecified: Secondary | ICD-10-CM

## 2022-02-28 ENCOUNTER — Other Ambulatory Visit: Payer: Self-pay | Admitting: *Deleted

## 2022-02-28 DIAGNOSIS — I739 Peripheral vascular disease, unspecified: Secondary | ICD-10-CM

## 2022-02-28 DIAGNOSIS — I6523 Occlusion and stenosis of bilateral carotid arteries: Secondary | ICD-10-CM

## 2022-03-04 DIAGNOSIS — N644 Mastodynia: Secondary | ICD-10-CM | POA: Diagnosis not present

## 2022-03-04 DIAGNOSIS — C61 Malignant neoplasm of prostate: Secondary | ICD-10-CM | POA: Diagnosis not present

## 2022-03-04 DIAGNOSIS — R051 Acute cough: Secondary | ICD-10-CM | POA: Diagnosis not present

## 2022-03-04 DIAGNOSIS — I119 Hypertensive heart disease without heart failure: Secondary | ICD-10-CM | POA: Diagnosis not present

## 2022-03-04 DIAGNOSIS — N6459 Other signs and symptoms in breast: Secondary | ICD-10-CM | POA: Diagnosis not present

## 2022-03-07 DIAGNOSIS — N62 Hypertrophy of breast: Secondary | ICD-10-CM | POA: Diagnosis not present

## 2022-03-07 DIAGNOSIS — R922 Inconclusive mammogram: Secondary | ICD-10-CM | POA: Diagnosis not present

## 2022-03-07 DIAGNOSIS — N644 Mastodynia: Secondary | ICD-10-CM | POA: Diagnosis not present

## 2022-03-26 NOTE — Progress Notes (Unsigned)
Cardiology Office Note:    Date:  03/27/2022   ID:  [name], DOB 1945-12-19, MRN [id number]  PCP:  Haywood Pao, MD   [facility] Providers Cardiologist:  Mertie Moores, MD     Referring MD: Haywood Pao, MD   Chief Complaint: follow-up CAD, chronic HFpEF  History of Present Illness:    [name] is a very pleasant 76 y.o. male with a hx of CAD s/p CABG 2003, hypertension,  chronic HFpEF, type 2 diabetes,hypokalemia, bilateral carotid artery disease, and hyperlipidemia.  History of CABG in 2003.  Remote history of right carotid endarterectomy about 18 years ago, left carotid endarterectomy by Dr. Oneida Alar in 2014. Normal nuclear stress test 2012. Claudication of right leg and underwent successful vascular surgery February 2015. Previous patient of Dr. Mare Ferrari.   He was last seen in our office on 12/11/2021 by Dr. Acie Fredrickson at which time lab work and symptoms of CHF were stable and he was advised to follow-up in 6 months.   Today, he is here alone for follow-up.  He reports severely sore breasts and wonders if it is related to spironolactone.  Had an MRI which was negative and someone suggested to him breast tenderness may be coming from spironolactone. Reports otherwise he is feeling in his usual state of health.  Reports he has very little energy and cannot walk far without severe leg discomfort.  He enjoys putting together puzzles, cooking and baking, and does all of the house work. Has chronic dyspnea that he feels is stable. He denies chest pain, lower extremity edema, fatigue, palpitations, melena, hematuria, hemoptysis, diaphoresis, presyncope, syncope, orthopnea, and PND.  Past Medical History:  Diagnosis Date   Arthritis    CAD (coronary artery disease)    Carotid artery occlusion    Diabetes mellitus    fasting 100-120s   Dizziness    Dysrhythmia    skips beats   GERD (gastroesophageal reflux disease)    History of kidney stones    Hyperlipidemia     Hypertension    IHD (ischemic heart disease)    Prior CABG in 2003   Normal nuclear stress test June 2012   Peripheral vascular disease ([place])    Prostate cancer ([place])    Torn rotator cuff     Past Surgical History:  Procedure Laterality Date   ABDOMINAL AORTAGRAM N/A 12/21/2013   Procedure: ABDOMINAL Maxcine Ham;  Surgeon: Elam Dutch, MD;  Location: [facility] CATH LAB;  Service: Cardiovascular;  Laterality: N/A;   ABDOMINAL AORTOGRAM N/A 11/13/2017   Procedure: ABDOMINAL AORTOGRAM;  Surgeon: Elam Dutch, MD;  Location: [place] CV LAB;  Service: Cardiovascular;  Laterality: N/A;   APPENDECTOMY     ARCH AORTOGRAM  08/26/2013   Procedure: ARCH AORTOGRAM;  Surgeon: Elam Dutch, MD;  Location: [facility] CATH LAB;  Service: Cardiovascular;;   CARDIAC CATHETERIZATION  05/03/2002   EF 40-45%   CARDIOVASCULAR STRESS TEST  08/10/2006   EF 65%   CAROTID ENDARTERECTOMY     CAROTID STENT INSERTION Left 08/26/2013   Procedure: CAROTID STENT INSERTION;  Surgeon: Elam Dutch, MD;  Location: [facility] CATH LAB;  Service: Cardiovascular;  Laterality: Left;  internal carotid   CORONARY ARTERY BYPASS GRAFT  04/2002   DOPPLER ECHOCARDIOGRAPHY  08/12/2002   EF 80-85%   ENDARTERECTOMY Left 07/20/2013   Procedure: ATTEMPTED ENDARTERECTOMY CAROTID;  Surgeon: Conrad Point Marion, MD;  Location: [place];  Service: Vascular;  Laterality: Left;   HERNIA REPAIR  LOWER EXTREMITY ANGIOGRAPHY  11/13/2017   Procedure: Lower Extremity Angiography;  Surgeon: Elam Dutch, MD;  Location: [place] CV LAB;  Service: Cardiovascular;;   PERIPHERAL VASCULAR INTERVENTION Right 11/13/2017   Procedure: PERIPHERAL VASCULAR INTERVENTION;  Surgeon: Elam Dutch, MD;  Location: [place] CV LAB;  Service: Cardiovascular;  Laterality: Right;  superficial femoral   SHOULDER SURGERY      Current Medications: Current Meds  Medication Sig   albuterol (VENTOLIN HFA) 108 (90 Base) MCG/ACT inhaler Inhale 1 puff into the lungs  as directed.   amLODipine-benazepril (LOTREL) 10-40 MG capsule Take 1 capsule by mouth once daily   aspirin EC 81 MG tablet Take 81 mg by mouth daily.   carvedilol (COREG) 6.25 MG tablet Take 1 tablet (6.25 mg total) by mouth 2 (two) times daily.   clopidogrel (PLAVIX) 75 MG tablet Take 1 tablet (75 mg total) by mouth daily.   eplerenone (INSPRA) 50 MG tablet Take 1 tablet (50 mg total) by mouth daily.   furosemide (LASIX) 20 MG tablet Take 1 tablet by mouth only as needed for swelling, along with the 40 mg tablets.   furosemide (LASIX) 40 MG tablet Take 1 tablet (40 mg total) by mouth daily.   hydrALAZINE (APRESOLINE) 50 MG tablet Take 1 tablet (50 mg total) by mouth 3 (three) times daily.   insulin NPH Human (NOVOLIN N) 100 UNIT/ML injection Inject 45 Units into the skin 2 (two) times daily before a meal.   insulin regular (NOVOLIN R) 100 units/mL injection as directed.   metFORMIN (GLUCOPHAGE-XR) 500 MG 24 hr tablet Take 500 mg by mouth 2 (two) times daily with a meal.    nitroGLYCERIN (NITROSTAT) 0.4 MG SL tablet Place 1 tablet (0.4 mg total) under the tongue every 5 (five) minutes as needed for chest pain.   pantoprazole (PROTONIX) 20 MG tablet Take 1 tablet (20 mg total) by mouth daily.   potassium chloride SA (KLOR-CON M) 20 MEQ tablet Take 2 tablets (40 mEq total) by mouth daily.   rosuvastatin (CRESTOR) 20 MG tablet Take 1 tablet by mouth once daily   [DISCONTINUED] spironolactone (ALDACTONE) 100 MG tablet Take 1 tablet (100 mg total) by mouth daily.     Allergies:   Lipitor [atorvastatin calcium], Sulfa antibiotics, Sulfa drugs cross reactors, Sulfamethoxazole-trimethoprim, and Codeine   Social History   Socioeconomic History   Marital status: Married    Spouse name: Not on file   Number of children: 2   Years of education: Not on file   Highest education level: Not on file  Occupational History   Not on file  Tobacco Use   Smoking status: Former    Packs/day: 1.00     Years: 20.00    Pack years: 20.00    Types: Cigarettes    Quit date: 10/27/2001    Years since quitting: 20.4    Passive exposure: Never   Smokeless tobacco: Never  Vaping Use   Vaping Use: Never used  Substance and Sexual Activity   Alcohol use: No    Alcohol/week: 0.0 standard drinks   Drug use: No   Sexual activity: Not Currently  Other Topics Concern   Not on file  Social History Narrative   Not on file   Social Determinants of Health   Financial Resource Strain: Not on file  Food Insecurity: Not on file  Transportation Needs: Not on file  Physical Activity: Not on file  Stress: Not on file  Social Connections: Not on  file     Family History: The patient's family history includes Diabetes in his brother, daughter, mother, sister, and sister; Heart attack in his father and mother; Heart disease in his daughter, father, and mother; Hypertension in his daughter, father, and mother. There is no history of Breast cancer, Prostate cancer, Colon cancer, or Pancreatic cancer.  ROS:   Please see the history of present illness.    + breast tenderness All other systems reviewed and are negative.  Labs/Other Studies Reviewed:    The following studies were reviewed today:  VAS US Carotid Duplex 02/21/22  Right Carotid: Velocities in the right ICA are consistent with a 1-39%  stenosis.   Left Carotid: Elevated velocities suggesting 50-75% stenosis in the mid  and distal ICA stent.   Vertebrals:  Bilateral vertebral arteries demonstrate antegrade flow. Left               vertebral artery demonstrates high resistant flow.  Subclavians: Normal flow hemodynamics were seen in bilateral subclavian               arteries.    Echo 07/03/21  1. Left ventricular ejection fraction, by estimation, is 60 to 65%. The  left ventricle has normal function. The left ventricle has no regional  wall motion abnormalities. Left ventricular diastolic parameters are  consistent with Grade II  diastolic  dysfunction (pseudonormalization). Elevated left atrial pressure. There is  severe hypokinesis/ near akinesis of the left ventricular, basal-mid  inferior wall.   2. Right ventricular systolic function is normal. The right ventricular  size is normal. There is normal pulmonary artery systolic pressure. The  estimated right ventricular systolic pressure is 81.7 mmHg.   3. Left atrial size was mildly dilated.   4. The mitral valve is normal in structure. Trivial mitral valve  regurgitation. No evidence of mitral stenosis.   5. The aortic valve is tricuspid. There is mild calcification of the  aortic valve. There is moderate thickening of the aortic valve. Aortic  valve regurgitation is mild to moderate. Mild to moderate aortic valve  sclerosis/calcification is present,  without any evidence of aortic stenosis.   6. The inferior vena cava is normal in size with greater than 50%  respiratory variability, suggesting right atrial pressure of 3 mmHg.   Recent Labs: 06/15/2021: ALT 14 11/30/2021: B Natriuretic Peptide 779.9; Hemoglobin 11.4; Platelets 228 12/11/2021: BUN 25; Creatinine, Ser 1.30; Potassium 4.5; Sodium 141  Recent Lipid Panel    Component Value Date/Time   CHOL 132 08/24/2018 0825   TRIG 149 08/24/2018 0825   HDL 34 (L) 08/24/2018 0825   CHOLHDL 3.9 08/24/2018 0825   CHOLHDL 3.2 06/13/2016 0918   VLDL 21 06/13/2016 0918   LDLCALC 68 08/24/2018 0825     Risk Assessment/Calculations:      Physical Exam:    VS:  BP (!) 140/58 (BP Location: Right Arm, Patient Position: Sitting, Cuff Size: Normal)   Pulse 86   Ht '5\' 11"'$  (1.803 m)   Wt 182 lb (82.6 kg)   SpO2 98%   BMI 25.38 kg/m     Wt Readings from Last 3 Encounters:  03/27/22 182 lb (82.6 kg)  02/26/22 180 lb 9.6 oz (81.9 kg)  12/11/21 175 lb 12.8 oz (79.7 kg)     GEN:  Well nourished, well developed in no acute distress HEENT: Normal NECK: No JVD; No carotid bruits CARDIAC: RRR, no murmurs, rubs,  gallops RESPIRATORY:  Clear to auscultation without rales, wheezing or rhonchi  ABDOMEN: Soft, non-tender, non-distended MUSCULOSKELETAL:  No edema; No deformity. 2+ pedal pulses, equal bilaterally SKIN: Warm and dry NEUROLOGIC:  Alert and oriented x 3 PSYCHIATRIC:  Normal affect   EKG:  EKG is not ordered today.    Diagnoses:    1. Hypokalemia   2. Essential (primary) hypertension   3. Breast tenderness in male   4. Hyperlipidemia LDL goal <70   5. Coronary artery disease involving native coronary artery of native heart without angina pectoris   6. S/P CABG (coronary artery bypass graft)   7. Chronic heart failure with preserved ejection fraction (HFpEF) (HCC)    Assessment and Plan:     Chronic HFpEF: Most recent echo 07/03/2021 revealed LVEF 60 to 47%, grade 2 diastolic dysfunction, severe hypokinesis of the LV basal mid inferior wall that Dr. Acie Fredrickson felt was 2/2 to prior MI. He reports dyspnea that he feels is chronic. Activity is limited by leg pain. No edema, orthopnea, or PND. Takes an extra one half Lasix 40 mg if he notes that he has mild leg edema. We will give him a prescription for Lasix 20 mg to use as needed.  He appears euvolemic on exam today.  Having breast tenderness on spironolactone.  I have discussed with pharmacist and we will switch him to eplerenone without a drug holiday. Continue Lotrel, carvedilol, Lasix.   Breast tenderness: Reports significant breast tenderness.  Has undergone examination and mammogram.  Was advised by another provider it might be related to spironolactone.  We will DC spironolactone as noted above.  CAD s/p CABG 2003 without angina: Normal stress test 2012.  He denies chest pain, dyspnea, or other symptoms concerning for angina.  No indication for further ischemic evaluation at this time. He denies bleeding problems.  Continue aspirin, Plavix, rosuvastatin, carvedilol.   Hypertension: BP elevated today. By my recheck it is 140/58. Home SBP  120s to 130s.  We are switching spironolactone to eplerenone.  I have asked patient to monitor home BP for 2 weeks and send to [place] through MyChart or by phone.  Hyperlipidemia LDL goal < 70: LDL 66 on 12/24/2021.  Continue rosuvastatin.  Hypokalemia: History of low potassium even on supplementation. K+ 4.5 on 12/11/21.  He has been on both spironolactone and an oral potassium supplement.  We will recheck basic metabolic panel today.  States he needs a refill of potassium and is aware we will await lab results to send Rx.    Disposition 6 months with Dr. Acie Fredrickson    Medication Adjustments/Labs and Tests Ordered: Current medicines are reviewed at length with the patient today.  Concerns regarding medicines are outlined above.  Orders Placed This Encounter  Procedures   Basic metabolic panel   Meds ordered this encounter  Medications   furosemide (LASIX) 20 MG tablet    Sig: Take 1 tablet by mouth only as needed for swelling, along with the 40 mg tablets.    Dispense:  15 tablet    Refill:  3   eplerenone (INSPRA) 50 MG tablet    Sig: Take 1 tablet (50 mg total) by mouth daily.    Dispense:  30 tablet    Refill:  11    Order Specific Question:   Supervising Provider    Answer:   Thayer Headings [phone]    Patient Instructions  Medication Instructions:  Your physician has recommended you make the following change in your medication:   STOP Spironolactone   *If you need a refill on  your cardiac medications before your next appointment, please call your pharmacy*   Lab Work: TODAY:  BMET  If you have labs (blood work) drawn today and your tests are completely normal, you will receive your results only by: [place] (if you have MyChart) OR A paper copy in the mail If you have any lab test that is abnormal or we need to change your treatment, we will call you to review the results.   Testing/Procedures: None ordered   Follow-Up: At [facility], you and your health  needs are our priority.  As part of our continuing mission to provide you with exceptional heart care, we have created designated Provider Care Teams.  These Care Teams include your primary Cardiologist (physician) and Advanced Practice Providers (APPs -  Physician Assistants and Nurse Practitioners) who all work together to provide you with the care you need, when you need it.  We recommend signing up for the patient portal called "MyChart".  Sign up information is provided on this After Visit Summary.  MyChart is used to connect with patients for Virtual Visits (Telemedicine).  Patients are able to view lab/test results, encounter notes, upcoming appointments, etc.  Non-urgent messages can be sent to your provider as well.   To learn more about what you can do with MyChart, go to [email or web address].    Your next appointment:   3-4 MONTHS  07/18/22 ARRIVE AT 1:05   The format for your next appointment:   In Person  Provider:   Mertie Moores, MD     Other Instructions Your physician has requested that you regularly monitor and record your blood pressure readings at home. Please use the same machine at the same time of day to check your readings and record them to bring to your follow-up visit.   Please monitor blood pressures and keep a log of your readings for 2 weeks then send in a mychart message with those.    Make sure to check 2 hours after your medications.    AVOID these things for 30 minutes before checking your blood pressure: No Drinking caffeine. No Drinking alcohol. No Eating. No Smoking. No Exercising.   Five minutes before checking your blood pressure: Pee. Sit in a dining chair. Avoid sitting in a soft couch or armchair. Be quiet. Do not talk   Important Information About Sugar         Signed, Emmaline Life, NP  03/27/2022 1:20 PM    [facility]

## 2022-03-27 ENCOUNTER — Ambulatory Visit: Payer: Medicare HMO | Admitting: Nurse Practitioner

## 2022-03-27 ENCOUNTER — Encounter: Payer: Self-pay | Admitting: Nurse Practitioner

## 2022-03-27 VITALS — BP 140/58 | HR 86 | Ht 71.0 in | Wt 182.0 lb

## 2022-03-27 DIAGNOSIS — I2510 Atherosclerotic heart disease of native coronary artery without angina pectoris: Secondary | ICD-10-CM | POA: Diagnosis not present

## 2022-03-27 DIAGNOSIS — E876 Hypokalemia: Secondary | ICD-10-CM

## 2022-03-27 DIAGNOSIS — I5032 Chronic diastolic (congestive) heart failure: Secondary | ICD-10-CM | POA: Diagnosis not present

## 2022-03-27 DIAGNOSIS — E785 Hyperlipidemia, unspecified: Secondary | ICD-10-CM | POA: Diagnosis not present

## 2022-03-27 DIAGNOSIS — N644 Mastodynia: Secondary | ICD-10-CM | POA: Diagnosis not present

## 2022-03-27 DIAGNOSIS — I10 Essential (primary) hypertension: Secondary | ICD-10-CM

## 2022-03-27 DIAGNOSIS — Z951 Presence of aortocoronary bypass graft: Secondary | ICD-10-CM

## 2022-03-27 DIAGNOSIS — E1142 Type 2 diabetes mellitus with diabetic polyneuropathy: Secondary | ICD-10-CM | POA: Diagnosis not present

## 2022-03-27 LAB — BASIC METABOLIC PANEL
BUN/Creatinine Ratio: 20 (ref 10–24)
BUN: 28 mg/dL — ABNORMAL HIGH (ref 8–27)
CO2: 19 mmol/L — ABNORMAL LOW (ref 20–29)
Calcium: 9.3 mg/dL (ref 8.6–10.2)
Chloride: 108 mmol/L — ABNORMAL HIGH (ref 96–106)
Creatinine, Ser: 1.39 mg/dL — ABNORMAL HIGH (ref 0.76–1.27)
Glucose: 71 mg/dL (ref 70–99)
Potassium: 5.6 mmol/L — ABNORMAL HIGH (ref 3.5–5.2)
Sodium: 140 mmol/L (ref 134–144)
eGFR: 53 mL/min/{1.73_m2} — ABNORMAL LOW (ref >59)

## 2022-03-27 MED ORDER — FUROSEMIDE 20 MG PO TABS: ORAL_TABLET | ORAL | 3 refills | Status: DC

## 2022-03-27 MED ORDER — EPLERENONE 50 MG PO TABS: 50.0000 mg | ORAL_TABLET | Freq: Every day | ORAL | 11 refills | Status: DC

## 2022-03-27 NOTE — Patient Instructions (Signed)
Medication Instructions:  Your physician has recommended you make the following change in your medication:   STOP Spironolactone   *If you need a refill on your cardiac medications before your next appointment, please call your pharmacy*   Lab Work: TODAY:  BMET  If you have labs (blood work) drawn today and your tests are completely normal, you will receive your results only by: [place] (if you have MyChart) OR A paper copy in the mail If you have any lab test that is abnormal or we need to change your treatment, we will call you to review the results.   Testing/Procedures: None ordered   Follow-Up: At [facility], you and your health needs are our priority.  As part of our continuing mission to provide you with exceptional heart care, we have created designated Provider Care Teams.  These Care Teams include your primary Cardiologist (physician) and Advanced Practice Providers (APPs -  Physician Assistants and Nurse Practitioners) who all work together to provide you with the care you need, when you need it.  We recommend signing up for the patient portal called "MyChart".  Sign up information is provided on this After Visit Summary.  MyChart is used to connect with patients for Virtual Visits (Telemedicine).  Patients are able to view lab/test results, encounter notes, upcoming appointments, etc.  Non-urgent messages can be sent to your provider as well.   To learn more about what you can do with MyChart, go to [email or web address].    Your next appointment:   3-4 MONTHS  07/18/22 ARRIVE AT 1:05   The format for your next appointment:   In Person  Provider:   Mertie Moores, MD     Other Instructions Your physician has requested that you regularly monitor and record your blood pressure readings at home. Please use the same machine at the same time of day to check your readings and record them to bring to your follow-up visit.   Please monitor blood pressures and  keep a log of your readings for 2 weeks then send in a mychart message with those.    Make sure to check 2 hours after your medications.    AVOID these things for 30 minutes before checking your blood pressure: No Drinking caffeine. No Drinking alcohol. No Eating. No Smoking. No Exercising.   Five minutes before checking your blood pressure: Pee. Sit in a dining chair. Avoid sitting in a soft couch or armchair. Be quiet. Do not talk   Important Information About Sugar

## 2022-03-28 ENCOUNTER — Telehealth: Payer: Self-pay | Admitting: *Deleted

## 2022-03-28 DIAGNOSIS — Z79899 Other long term (current) drug therapy: Secondary | ICD-10-CM

## 2022-03-28 NOTE — Telephone Encounter (Signed)
Called pt to discuss lab results, see result note.

## 2022-04-04 ENCOUNTER — Other Ambulatory Visit: Payer: Medicare HMO

## 2022-04-04 DIAGNOSIS — Z79899 Other long term (current) drug therapy: Secondary | ICD-10-CM

## 2022-04-04 LAB — BASIC METABOLIC PANEL
BUN/Creatinine Ratio: 20 (ref 10–24)
BUN: 26 mg/dL (ref 8–27)
CO2: 21 mmol/L (ref 20–29)
Calcium: 9.2 mg/dL (ref 8.6–10.2)
Chloride: 103 mmol/L (ref 96–106)
Creatinine, Ser: 1.3 mg/dL — ABNORMAL HIGH (ref 0.76–1.27)
Glucose: 257 mg/dL — ABNORMAL HIGH (ref 70–99)
Potassium: 5 mmol/L (ref 3.5–5.2)
Sodium: 137 mmol/L (ref 134–144)
eGFR: 58 mL/min/{1.73_m2} — ABNORMAL LOW (ref >59)

## 2022-04-25 DIAGNOSIS — I509 Heart failure, unspecified: Secondary | ICD-10-CM | POA: Diagnosis not present

## 2022-04-25 DIAGNOSIS — I119 Hypertensive heart disease without heart failure: Secondary | ICD-10-CM | POA: Diagnosis not present

## 2022-04-25 DIAGNOSIS — E1151 Type 2 diabetes mellitus with diabetic peripheral angiopathy without gangrene: Secondary | ICD-10-CM | POA: Diagnosis not present

## 2022-04-25 DIAGNOSIS — E7800 Pure hypercholesterolemia, unspecified: Secondary | ICD-10-CM | POA: Diagnosis not present

## 2022-04-26 DIAGNOSIS — E1142 Type 2 diabetes mellitus with diabetic polyneuropathy: Secondary | ICD-10-CM | POA: Diagnosis not present

## 2022-04-28 DIAGNOSIS — N62 Hypertrophy of breast: Secondary | ICD-10-CM | POA: Diagnosis not present

## 2022-05-02 DIAGNOSIS — R1032 Left lower quadrant pain: Secondary | ICD-10-CM | POA: Diagnosis not present

## 2022-05-02 DIAGNOSIS — K5792 Diverticulitis of intestine, part unspecified, without perforation or abscess without bleeding: Secondary | ICD-10-CM | POA: Diagnosis not present

## 2022-05-02 DIAGNOSIS — I119 Hypertensive heart disease without heart failure: Secondary | ICD-10-CM | POA: Diagnosis not present

## 2022-05-27 DIAGNOSIS — I10 Essential (primary) hypertension: Secondary | ICD-10-CM | POA: Diagnosis not present

## 2022-05-27 DIAGNOSIS — Z7984 Long term (current) use of oral hypoglycemic drugs: Secondary | ICD-10-CM | POA: Diagnosis not present

## 2022-05-27 DIAGNOSIS — Z794 Long term (current) use of insulin: Secondary | ICD-10-CM | POA: Diagnosis not present

## 2022-05-27 DIAGNOSIS — E785 Hyperlipidemia, unspecified: Secondary | ICD-10-CM | POA: Diagnosis not present

## 2022-05-27 DIAGNOSIS — E1142 Type 2 diabetes mellitus with diabetic polyneuropathy: Secondary | ICD-10-CM | POA: Diagnosis not present

## 2022-05-27 DIAGNOSIS — E1140 Type 2 diabetes mellitus with diabetic neuropathy, unspecified: Secondary | ICD-10-CM | POA: Diagnosis not present

## 2022-06-09 ENCOUNTER — Other Ambulatory Visit: Payer: Self-pay | Admitting: Cardiovascular Disease

## 2022-06-16 ENCOUNTER — Other Ambulatory Visit: Payer: Self-pay | Admitting: Cardiovascular Disease

## 2022-06-16 MED ORDER — FUROSEMIDE 20 MG PO TABS: ORAL_TABLET | ORAL | 3 refills | Status: DC

## 2022-06-25 DIAGNOSIS — E1151 Type 2 diabetes mellitus with diabetic peripheral angiopathy without gangrene: Secondary | ICD-10-CM | POA: Diagnosis not present

## 2022-06-25 DIAGNOSIS — H9193 Unspecified hearing loss, bilateral: Secondary | ICD-10-CM | POA: Diagnosis not present

## 2022-06-25 DIAGNOSIS — I119 Hypertensive heart disease without heart failure: Secondary | ICD-10-CM | POA: Diagnosis not present

## 2022-06-25 DIAGNOSIS — C61 Malignant neoplasm of prostate: Secondary | ICD-10-CM | POA: Diagnosis not present

## 2022-06-25 DIAGNOSIS — I6529 Occlusion and stenosis of unspecified carotid artery: Secondary | ICD-10-CM | POA: Diagnosis not present

## 2022-06-25 DIAGNOSIS — E559 Vitamin D deficiency, unspecified: Secondary | ICD-10-CM | POA: Diagnosis not present

## 2022-06-25 DIAGNOSIS — E7800 Pure hypercholesterolemia, unspecified: Secondary | ICD-10-CM | POA: Diagnosis not present

## 2022-06-25 DIAGNOSIS — I739 Peripheral vascular disease, unspecified: Secondary | ICD-10-CM | POA: Diagnosis not present

## 2022-06-25 DIAGNOSIS — K2270 Barrett's esophagus without dysplasia: Secondary | ICD-10-CM | POA: Diagnosis not present

## 2022-06-25 DIAGNOSIS — I25810 Atherosclerosis of coronary artery bypass graft(s) without angina pectoris: Secondary | ICD-10-CM | POA: Diagnosis not present

## 2022-06-25 DIAGNOSIS — I509 Heart failure, unspecified: Secondary | ICD-10-CM | POA: Diagnosis not present

## 2022-06-27 DIAGNOSIS — E1142 Type 2 diabetes mellitus with diabetic polyneuropathy: Secondary | ICD-10-CM | POA: Diagnosis not present

## 2022-07-08 DIAGNOSIS — Z8546 Personal history of malignant neoplasm of prostate: Secondary | ICD-10-CM | POA: Diagnosis not present

## 2022-07-15 DIAGNOSIS — C61 Malignant neoplasm of prostate: Secondary | ICD-10-CM | POA: Diagnosis not present

## 2022-07-17 ENCOUNTER — Encounter: Payer: Self-pay | Admitting: Cardiovascular Disease

## 2022-07-17 NOTE — Progress Notes (Signed)
Cardiology Office Note   Date:  07/18/2022   ID:  Abhijay, Morriss [birth year], MRN [id number]  PCP:  Haywood Pao, MD  Cardiologist: previously Darlin Coco MD , now Mertie Moores, MD   Chief Complaint  Patient presents with   Congestive Heart Failure          Coronary Artery Disease        Hyperlipidemia   Problem list 1. Coronary artery disease-status post CABG 2003 2. Hyperlipidemia 3. Carotid artery disease-status post left carotid stenting , s/p Right CEA  4. Essential hypertension 5. Diabetes mellitus     [name] is a 76 y.o. male who presents for a six-month follow-up visit  Notes from Dr. Mare Ferrari: . He has a past history of coronary artery disease and had coronary artery bypass graft surgery in 2003. He has a remote history of a right carotid endarterectomy about 18 years ago.  He had a left carotid endarterectomy by Dr. Oneida Alar in 2014. He has a history of high blood pressure, diabetes mellitus, and hyperlipidemia. He had a normal nuclear stress test in June 2012 prior to rotator cuff surgery.  The patient developed claudication of his right leg  and underwent successful vascular surgery in February 2015.   Since last visit he has been doing well with no chest pain or shortness of breath. Since last visit he has retired and has been enjoying retirement.  He is no longer experiencing any right calf claudication.  He has diabetes followed by Dr. Tamala Julian at [facility].  His most recent A1c was 7.2 Since last visit the patient has had no new cardiac symptoms.   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  He himself lives in a townhouse.    Aug. 18, 2017:  Mr. Edgett is seen today for the first time. He's a previous patient of Dr. Mare Ferrari. He is followed for coronary artery disease, hyperlipidemia, carotid artery disease, hypertension.  Doing well.   Stays active.   Does lots of  woodworking . Walks often   No angina   Feb. 14, 2018; Doing well.  No CP or dyspnea. Not exercising much ,  Avoids salt.   Oct. 1, 2018:  Here for eval of HTN. Still eating some salty food . Avoids hot dogs now .  stopped smoking years ago. As active as he can be,  Has a heel spur  February 23, 2018:  BP hyas been normal blood pressure is a bit high Is avoiding hot dogs  No CP or dyspnea.  Has a left carotid stent, s/p R CEA   August 24, 2018: [name]  is seen today for follow-up of his coronary artery disease, hypertension and carotid artery disease. Staying active,  Works in the garden,  Works in his woodworking shop  September 06, 2020: [name] is seen today for a follow-up of his coronary artery disease, coronary artery bypass grafting and carotid artery disease. Has prostate cancer,  Getting XRT.  Still doing some wood working.   Staying active  Has not felt well.   Sept. 12. 2022: [name] is seen today for follow up of his CAD, , CABG, carotid artery disease, HLd He was recently seen in the ER on Aug. 20, 2022  for leg swelling  He was given Lasix 40 mg BID for 3 days and then 40 mg a day Also was started on /kdur  10 meq BID x 3 days, followed by  Kdur 10 meq a day  Had lots of abdominal swelling  Leg swelling is better.   Is wearing compression hose.   We will draw basic metabolic profile today to assess his renal function and potassium after being on Lasix and potassium for several weeks.  His daughter typed out a sheet of questions for me to answer. I informed her that he has class II-III congestive heart failure.  His ejection fraction is 60 to 65%.  He has grade 2 diastolic dysfunction. Was also found to have a old inferior wall myocardial infarction that likely occurred before his CABG.  Concerning when the family should call, if he gains 3 pounds in the course of 1 day or 5 pounds in the course of the week they should call to get further advice.  He will  continue to wear his compression hose daily if possible.  If not daily then he should wear them as much as possible.  He is leg edema is gone and I do not think there is any role and drawn at brain natruretic peptide at this time.  He has some underlying renal insufficiency so a BNP is not going to be particularly useful.  He will return to see me in 3 months with a basic metabolic profile  September 30, 2021:  [name] is seen today for follow-up of his coronary artery disease,  HLD, hypertension, diabetes mellitus. Chf symptoms    Labs from Nov. 15, 2022 look stable  Complains of constipation   Feb. 15, 2023 [name] is seen today for follow up of CAD , HLD, HTN, DM, CHF Was getting dizzy,  he started taking hydralazine 1/2 tab ( 50 mg ) TID  Has had some URI symptoms Was seen in the ER  Is more short of breath  Has been feeling better .    Stopped smoking years ago  Has been taking Kdur 40 meq a day instead of 20 meq ( K was 2.9 in the ER on Feb. 4)   Sept. 22, 2023 [name] is seen for follow up of CAD, HLD HTN, CHf Chronic dyspnea No cp   Overall LV function is normal by echo , old inf MI  Has gynacomastia with spironolactnoe,  Starte on Eplerenone       Past Medical History:  Diagnosis Date   Arthritis    CAD (coronary artery disease)    Carotid artery occlusion    Diabetes mellitus    fasting 100-120s   Dizziness    Dysrhythmia    skips beats   GERD (gastroesophageal reflux disease)    History of kidney stones    Hyperlipidemia    Hypertension    IHD (ischemic heart disease)    Prior CABG in 2003   Normal nuclear stress test June 2012   Peripheral vascular disease ([place])    Prostate cancer ([place])    Torn rotator cuff     Past Surgical History:  Procedure Laterality Date   ABDOMINAL AORTAGRAM N/A 12/21/2013   Procedure: ABDOMINAL Maxcine Ham;  Surgeon: Elam Dutch, MD;  Location: [facility] CATH LAB;  Service: Cardiovascular;  Laterality: N/A;   ABDOMINAL  AORTOGRAM N/A 11/13/2017   Procedure: ABDOMINAL AORTOGRAM;  Surgeon: Elam Dutch, MD;  Location: [facility] CV LAB;  Service: Cardiovascular;  Laterality: N/A;   APPENDECTOMY     ARCH AORTOGRAM  08/26/2013   Procedure: ARCH AORTOGRAM;  Surgeon: Elam Dutch, MD;  Location: [facility]  CATH LAB;  Service: Cardiovascular;;   CARDIAC CATHETERIZATION  05/03/2002   EF 40-45%   CARDIOVASCULAR STRESS TEST  08/10/2006   EF 65%   CAROTID ENDARTERECTOMY     CAROTID STENT INSERTION Left 08/26/2013   Procedure: CAROTID STENT INSERTION;  Surgeon: Elam Dutch, MD;  Location: [facility] CATH LAB;  Service: Cardiovascular;  Laterality: Left;  internal carotid   CORONARY ARTERY BYPASS GRAFT  04/2002   DOPPLER ECHOCARDIOGRAPHY  08/12/2002   EF 80-85%   ENDARTERECTOMY Left 07/20/2013   Procedure: ATTEMPTED ENDARTERECTOMY CAROTID;  Surgeon: Conrad Concordia, MD;  Location: [facility];  Service: Vascular;  Laterality: Left;   HERNIA REPAIR     LOWER EXTREMITY ANGIOGRAPHY  11/13/2017   Procedure: Lower Extremity Angiography;  Surgeon: Elam Dutch, MD;  Location: [facility] CV LAB;  Service: Cardiovascular;;   PERIPHERAL VASCULAR INTERVENTION Right 11/13/2017   Procedure: PERIPHERAL VASCULAR INTERVENTION;  Surgeon: Elam Dutch, MD;  Location: [facility] CV LAB;  Service: Cardiovascular;  Laterality: Right;  superficial femoral   SHOULDER SURGERY       Current Outpatient Medications  Medication Sig Dispense Refill   albuterol (VENTOLIN HFA) 108 (90 Base) MCG/ACT inhaler Inhale 1 puff into the lungs as directed.     amLODipine-benazepril (LOTREL) 10-40 MG capsule Take 1 capsule by mouth once daily 90 capsule 3   aspirin EC 81 MG tablet Take 81 mg by mouth daily.     carvedilol (COREG) 6.25 MG tablet Take 1 tablet (6.25 mg total) by mouth 2 (two) times daily. 180 tablet 3   clopidogrel (PLAVIX) 75 MG tablet Take 1 tablet (75 mg total) by mouth daily. 90 tablet 3   furosemide (LASIX) 40 MG tablet Take 1 tablet  (40 mg total) by mouth daily. 90 tablet 3   hydrALAZINE (APRESOLINE) 50 MG tablet Take 1 tablet (50 mg total) by mouth 3 (three) times daily. 270 tablet 3   insulin NPH Human (NOVOLIN N) 100 UNIT/ML injection Inject 45 Units into the skin 2 (two) times daily before a meal.     insulin regular (NOVOLIN R) 100 units/mL injection as directed.     metFORMIN (GLUCOPHAGE-XR) 500 MG 24 hr tablet Take 500 mg by mouth 2 (two) times daily with a meal.      nitroGLYCERIN (NITROSTAT) 0.4 MG SL tablet Place 1 tablet (0.4 mg total) under the tongue every 5 (five) minutes as needed for chest pain. 25 tablet 6   pantoprazole (PROTONIX) 20 MG tablet Take 1 tablet by mouth once daily 90 tablet 3   rosuvastatin (CRESTOR) 20 MG tablet Take 1 tablet by mouth once daily 90 tablet 3   eplerenone (INSPRA) 50 MG tablet Take 1 tablet (50 mg total) by mouth daily. 90 tablet 3   furosemide (LASIX) 20 MG tablet Take 1 tablet by mouth only as needed for swelling, along with the 40 mg tablets. 90 tablet 1   No current facility-administered medications for this visit.    Allergies:   Lipitor [atorvastatin calcium], Sulfa antibiotics, Sulfa drugs cross reactors, Sulfamethoxazole-trimethoprim, and Codeine    Social History:  The patient  reports that he quit smoking about 20 years ago. His smoking use included cigarettes. He has a 20.00 pack-year smoking history. He has never been exposed to tobacco smoke. He has never used smokeless tobacco. He reports that he does not drink alcohol and does not use drugs.   Family History:  The patient's family history includes Diabetes in his brother, daughter,  mother, sister, and sister; Heart attack in his father and mother; Heart disease in his daughter, father, and mother; Hypertension in his daughter, father, and mother.    ROS:  Please see the history of present illness.   Otherwise, review of systems are positive for none.   All other systems are reviewed and negative.   Physical  Exam: Blood pressure (!) 128/45, pulse 63, height '5\' 11"'$  (1.803 m), weight 180 lb (81.6 kg), SpO2 96 %.       GEN:  Well nourished, well developed in no acute distress HEENT: Normal NECK: No JVD; bilat carotid bruits  LYMPHATICS: No lymphadenopathy CARDIAC: RRR soft systolic murmur  RESPIRATORY:  Clear to auscultation without rales, wheezing or rhonchi  ABDOMEN: Soft, non-tender, non-distended MUSCULOSKELETAL:  No edema; No deformity  SKIN: Warm and dry NEUROLOGIC:  Alert and oriented x 3    EKG:        Recent Labs: 11/30/2021: B Natriuretic Peptide 779.9; Hemoglobin 11.4; Platelets 228 04/04/2022: BUN 26; Creatinine, Ser 1.30; Potassium 5.0; Sodium 137    Lipid Panel    Component Value Date/Time   CHOL 132 08/24/2018 0825   TRIG 149 08/24/2018 0825   HDL 34 (L) 08/24/2018 0825   CHOLHDL 3.9 08/24/2018 0825   CHOLHDL 3.2 06/13/2016 0918   VLDL 21 06/13/2016 0918   LDLCALC 68 08/24/2018 0825    Wt Readings from Last 3 Encounters:  07/18/22 180 lb (81.6 kg)  03/27/22 182 lb (82.6 kg)  02/26/22 180 lb 9.6 oz (81.9 kg)      ASSESSMENT AND PLAN:  1.   CAD :     no angina .     2. Aortic insufficiency :  stable      3.  Acute on chronic diastolic congestive heart failure:  Has chronic dyspnea,  no cp No rales, leg edema Likely euvolumic  Cont current meds        4. Hyperlipidemia.   -     6. Bilateral carotid artery disease:   -  followed by Dr. Donzetta Matters.       Current medicines are reviewed at length with the patient today.  The patient does not have concerns regarding medicines.  The following changes have been made:  no change  Labs/ tests ordered today include:   Orders Placed This Encounter  Procedures   Lipid panel   ALT   Basic metabolic panel        Mertie Moores, MD  07/18/2022 7:38 PM    [facility] [address],  [place], [place]  [address] Pager [phone] Phone: [phone]; Fax: [phone]

## 2022-07-18 ENCOUNTER — Encounter: Payer: Self-pay | Admitting: Cardiovascular Disease

## 2022-07-18 ENCOUNTER — Ambulatory Visit: Payer: Medicare HMO | Attending: Cardiovascular Disease | Admitting: Cardiovascular Disease

## 2022-07-18 VITALS — BP 128/45 | HR 63 | Ht 71.0 in | Wt 180.0 lb

## 2022-07-18 DIAGNOSIS — I5032 Chronic diastolic (congestive) heart failure: Secondary | ICD-10-CM

## 2022-07-18 DIAGNOSIS — I351 Nonrheumatic aortic (valve) insufficiency: Secondary | ICD-10-CM

## 2022-07-18 MED ORDER — EPLERENONE 50 MG PO TABS: 50.0000 mg | ORAL_TABLET | Freq: Every day | ORAL | 3 refills | Status: DC

## 2022-07-18 MED ORDER — FUROSEMIDE 20 MG PO TABS: ORAL_TABLET | ORAL | 1 refills | Status: DC

## 2022-07-18 NOTE — Patient Instructions (Signed)
Medication Instructions:  Your physician recommends that you continue on your current medications as directed. Please refer to the Current Medication list given to you today.  *If you need a refill on your cardiac medications before your next appointment, please call your pharmacy*   Lab Work: Lipids, ALT, BMET today If you have labs (blood work) drawn today and your tests are completely normal, you will receive your results only by: MyChart Message (if you have MyChart) OR A paper copy in the mail If you have any lab test that is abnormal or we need to change your treatment, we will call you to review the results.   Testing/Procedures: NONE   Follow-Up: At [facility], you and your health needs are our priority.  As part of our continuing mission to provide you with exceptional heart care, we have created designated Provider Care Teams.  These Care Teams include your primary Cardiologist (physician) and Advanced Practice Providers (APPs -  Physician Assistants and Nurse Practitioners) who all work together to provide you with the care you need, when you need it.  Your next appointment:   1 year(s)  The format for your next appointment:   In Person  Provider:   Philip Nahser, MD       Important Information About Sugar       

## 2022-07-19 LAB — BASIC METABOLIC PANEL
BUN/Creatinine Ratio: 14 (ref 10–24)
BUN: 18 mg/dL (ref 8–27)
CO2: 19 mmol/L — ABNORMAL LOW (ref 20–29)
Calcium: 8.9 mg/dL (ref 8.6–10.2)
Chloride: 106 mmol/L (ref 96–106)
Creatinine, Ser: 1.3 mg/dL — ABNORMAL HIGH (ref 0.76–1.27)
Glucose: 149 mg/dL — ABNORMAL HIGH (ref 70–99)
Potassium: 3.8 mmol/L (ref 3.5–5.2)
Sodium: 142 mmol/L (ref 134–144)
eGFR: 58 mL/min/{1.73_m2} — ABNORMAL LOW (ref >59)

## 2022-07-19 LAB — LIPID PANEL
Chol/HDL Ratio: 3.3 ratio (ref 0.0–5.0)
Cholesterol, Total: 103 mg/dL (ref 100–199)
HDL: 31 mg/dL — ABNORMAL LOW (ref >39)
LDL Chol Calc (NIH): 43 mg/dL (ref 0–99)
Triglycerides: 175 mg/dL — ABNORMAL HIGH (ref 0–149)
VLDL Cholesterol Cal: 29 mg/dL (ref 5–40)

## 2022-07-19 LAB — ALT: ALT: 10 IU/L (ref 0–44)

## 2022-07-27 DIAGNOSIS — E1142 Type 2 diabetes mellitus with diabetic polyneuropathy: Secondary | ICD-10-CM | POA: Diagnosis not present

## 2022-08-09 DIAGNOSIS — Z23 Encounter for immunization: Secondary | ICD-10-CM | POA: Diagnosis not present

## 2022-08-11 DIAGNOSIS — E113291 Type 2 diabetes mellitus with mild nonproliferative diabetic retinopathy without macular edema, right eye: Secondary | ICD-10-CM | POA: Diagnosis not present

## 2022-08-16 ENCOUNTER — Other Ambulatory Visit: Payer: Self-pay | Admitting: Cardiovascular Disease

## 2022-08-21 DIAGNOSIS — Z23 Encounter for immunization: Secondary | ICD-10-CM | POA: Diagnosis not present

## 2022-08-22 DIAGNOSIS — J1189 Influenza due to unidentified influenza virus with other manifestations: Secondary | ICD-10-CM | POA: Diagnosis not present

## 2022-08-27 DIAGNOSIS — E1142 Type 2 diabetes mellitus with diabetic polyneuropathy: Secondary | ICD-10-CM | POA: Diagnosis not present

## 2022-08-27 DIAGNOSIS — E1151 Type 2 diabetes mellitus with diabetic peripheral angiopathy without gangrene: Secondary | ICD-10-CM | POA: Diagnosis not present

## 2022-08-27 DIAGNOSIS — J449 Chronic obstructive pulmonary disease, unspecified: Secondary | ICD-10-CM | POA: Diagnosis not present

## 2022-08-27 DIAGNOSIS — R051 Acute cough: Secondary | ICD-10-CM | POA: Diagnosis not present

## 2022-08-27 DIAGNOSIS — J189 Pneumonia, unspecified organism: Secondary | ICD-10-CM | POA: Diagnosis not present

## 2022-09-04 ENCOUNTER — Other Ambulatory Visit: Payer: Self-pay | Admitting: Cardiovascular Disease

## 2022-09-16 DIAGNOSIS — J449 Chronic obstructive pulmonary disease, unspecified: Secondary | ICD-10-CM | POA: Diagnosis not present

## 2022-09-16 DIAGNOSIS — J189 Pneumonia, unspecified organism: Secondary | ICD-10-CM | POA: Diagnosis not present

## 2022-09-20 ENCOUNTER — Inpatient Hospital Stay (HOSPITAL_BASED_OUTPATIENT_CLINIC_OR_DEPARTMENT_OTHER)
Admission: EM | Admit: 2022-09-20 | Discharge: 2022-09-25 | DRG: 286 | Disposition: A | Discharge status: Home / Self Care | Payer: Medicare HMO | Attending: Internal Medicine | Admitting: Internal Medicine

## 2022-09-20 ENCOUNTER — Telehealth: Payer: Self-pay | Admitting: Cardiology

## 2022-09-20 ENCOUNTER — Other Ambulatory Visit: Payer: Self-pay

## 2022-09-20 ENCOUNTER — Encounter (HOSPITAL_COMMUNITY): Payer: Self-pay

## 2022-09-20 ENCOUNTER — Emergency Department (HOSPITAL_BASED_OUTPATIENT_CLINIC_OR_DEPARTMENT_OTHER): Payer: Medicare HMO | Admitting: Radiology

## 2022-09-20 DIAGNOSIS — Z20822 Contact with and (suspected) exposure to covid-19: Secondary | ICD-10-CM | POA: Diagnosis not present

## 2022-09-20 DIAGNOSIS — Z794 Long term (current) use of insulin: Secondary | ICD-10-CM

## 2022-09-20 DIAGNOSIS — Z888 Allergy status to other drugs, medicaments and biological substances status: Secondary | ICD-10-CM

## 2022-09-20 DIAGNOSIS — E876 Hypokalemia: Secondary | ICD-10-CM | POA: Diagnosis present

## 2022-09-20 DIAGNOSIS — I429 Cardiomyopathy, unspecified: Secondary | ICD-10-CM | POA: Diagnosis not present

## 2022-09-20 DIAGNOSIS — Z7982 Long term (current) use of aspirin: Secondary | ICD-10-CM

## 2022-09-20 DIAGNOSIS — Z7902 Long term (current) use of antithrombotics/antiplatelets: Secondary | ICD-10-CM | POA: Diagnosis not present

## 2022-09-20 DIAGNOSIS — Z8249 Family history of ischemic heart disease and other diseases of the circulatory system: Secondary | ICD-10-CM | POA: Diagnosis not present

## 2022-09-20 DIAGNOSIS — C61 Malignant neoplasm of prostate: Secondary | ICD-10-CM | POA: Diagnosis not present

## 2022-09-20 DIAGNOSIS — I440 Atrioventricular block, first degree: Secondary | ICD-10-CM | POA: Diagnosis not present

## 2022-09-20 DIAGNOSIS — I110 Hypertensive heart disease with heart failure: Secondary | ICD-10-CM | POA: Diagnosis not present

## 2022-09-20 DIAGNOSIS — J9601 Acute respiratory failure with hypoxia: Secondary | ICD-10-CM | POA: Diagnosis present

## 2022-09-20 DIAGNOSIS — N179 Acute kidney failure, unspecified: Secondary | ICD-10-CM | POA: Diagnosis present

## 2022-09-20 DIAGNOSIS — E785 Hyperlipidemia, unspecified: Secondary | ICD-10-CM | POA: Diagnosis present

## 2022-09-20 DIAGNOSIS — I2510 Atherosclerotic heart disease of native coronary artery without angina pectoris: Secondary | ICD-10-CM | POA: Diagnosis present

## 2022-09-20 DIAGNOSIS — E1122 Type 2 diabetes mellitus with diabetic chronic kidney disease: Secondary | ICD-10-CM | POA: Diagnosis present

## 2022-09-20 DIAGNOSIS — Z7984 Long term (current) use of oral hypoglycemic drugs: Secondary | ICD-10-CM

## 2022-09-20 DIAGNOSIS — N1831 Chronic kidney disease, stage 3a: Secondary | ICD-10-CM | POA: Diagnosis present

## 2022-09-20 DIAGNOSIS — I5033 Acute on chronic diastolic (congestive) heart failure: Secondary | ICD-10-CM | POA: Diagnosis present

## 2022-09-20 DIAGNOSIS — Z951 Presence of aortocoronary bypass graft: Secondary | ICD-10-CM

## 2022-09-20 DIAGNOSIS — D649 Anemia, unspecified: Secondary | ICD-10-CM | POA: Diagnosis not present

## 2022-09-20 DIAGNOSIS — D509 Iron deficiency anemia, unspecified: Secondary | ICD-10-CM | POA: Diagnosis present

## 2022-09-20 DIAGNOSIS — I130 Hypertensive heart and chronic kidney disease with heart failure and stage 1 through stage 4 chronic kidney disease, or unspecified chronic kidney disease: Secondary | ICD-10-CM | POA: Diagnosis not present

## 2022-09-20 DIAGNOSIS — I509 Heart failure, unspecified: Principal | ICD-10-CM

## 2022-09-20 DIAGNOSIS — Z66 Do not resuscitate: Secondary | ICD-10-CM | POA: Diagnosis not present

## 2022-09-20 DIAGNOSIS — R0602 Shortness of breath: Secondary | ICD-10-CM | POA: Diagnosis not present

## 2022-09-20 DIAGNOSIS — E1151 Type 2 diabetes mellitus with diabetic peripheral angiopathy without gangrene: Secondary | ICD-10-CM | POA: Diagnosis not present

## 2022-09-20 DIAGNOSIS — I5043 Acute on chronic combined systolic (congestive) and diastolic (congestive) heart failure: Secondary | ICD-10-CM | POA: Diagnosis present

## 2022-09-20 DIAGNOSIS — J449 Chronic obstructive pulmonary disease, unspecified: Secondary | ICD-10-CM | POA: Diagnosis present

## 2022-09-20 DIAGNOSIS — Z87891 Personal history of nicotine dependence: Secondary | ICD-10-CM

## 2022-09-20 DIAGNOSIS — Z882 Allergy status to sulfonamides status: Secondary | ICD-10-CM

## 2022-09-20 DIAGNOSIS — J90 Pleural effusion, not elsewhere classified: Secondary | ICD-10-CM | POA: Diagnosis not present

## 2022-09-20 DIAGNOSIS — Z833 Family history of diabetes mellitus: Secondary | ICD-10-CM

## 2022-09-20 DIAGNOSIS — K219 Gastro-esophageal reflux disease without esophagitis: Secondary | ICD-10-CM | POA: Diagnosis present

## 2022-09-20 DIAGNOSIS — Z885 Allergy status to narcotic agent status: Secondary | ICD-10-CM

## 2022-09-20 DIAGNOSIS — Z79899 Other long term (current) drug therapy: Secondary | ICD-10-CM

## 2022-09-20 DIAGNOSIS — Z881 Allergy status to other antibiotic agents status: Secondary | ICD-10-CM

## 2022-09-20 LAB — CBC WITH DIFFERENTIAL/PLATELET
Abs Immature Granulocytes: 0.01 10*3/uL (ref 0.00–0.07)
Basophils Absolute: 0 10*3/uL (ref 0.0–0.1)
Basophils Relative: 1 %
Eosinophils Absolute: 0.1 10*3/uL (ref 0.0–0.5)
Eosinophils Relative: 2 %
HCT: 33.7 % — ABNORMAL LOW (ref 39.0–52.0)
Hemoglobin: 10.3 g/dL — ABNORMAL LOW (ref 13.0–17.0)
Immature Granulocytes: 0 %
Lymphocytes Relative: 17 %
Lymphs Abs: 0.9 10*3/uL (ref 0.7–4.0)
MCH: 26.5 pg (ref 26.0–34.0)
MCHC: 30.6 g/dL (ref 30.0–36.0)
MCV: 86.9 fL (ref 80.0–100.0)
Monocytes Absolute: 0.4 10*3/uL (ref 0.1–1.0)
Monocytes Relative: 7 %
Neutro Abs: 3.9 10*3/uL (ref 1.7–7.7)
Neutrophils Relative %: 73 %
Platelets: 244 10*3/uL (ref 150–400)
RBC: 3.88 MIL/uL — ABNORMAL LOW (ref 4.22–5.81)
RDW: 16.4 % — ABNORMAL HIGH (ref 11.5–15.5)
WBC: 5.4 10*3/uL (ref 4.0–10.5)
nRBC: 0 % (ref 0.0–0.2)

## 2022-09-20 LAB — CBC
HCT: 32 % — ABNORMAL LOW (ref 39.0–52.0)
Hemoglobin: 9.9 g/dL — ABNORMAL LOW (ref 13.0–17.0)
MCH: 27.2 pg (ref 26.0–34.0)
MCHC: 30.9 g/dL (ref 30.0–36.0)
MCV: 87.9 fL (ref 80.0–100.0)
Platelets: 198 K/uL (ref 150–400)
RBC: 3.64 MIL/uL — ABNORMAL LOW (ref 4.22–5.81)
RDW: 16.2 % — ABNORMAL HIGH (ref 11.5–15.5)
WBC: 4.8 K/uL (ref 4.0–10.5)
nRBC: 0 % (ref 0.0–0.2)

## 2022-09-20 LAB — BASIC METABOLIC PANEL
Anion gap: 14 (ref 5–15)
Anion gap: 12 (ref 5–15)
BUN: 21 mg/dL (ref 8–23)
BUN: 18 mg/dL (ref 8–23)
CO2: 25 mmol/L (ref 22–32)
CO2: 22 mmol/L (ref 22–32)
Calcium: 8.8 mg/dL — ABNORMAL LOW (ref 8.9–10.3)
Calcium: 8.5 mg/dL — ABNORMAL LOW (ref 8.9–10.3)
Chloride: 104 mmol/L (ref 98–111)
Chloride: 109 mmol/L (ref 98–111)
Creatinine, Ser: 1.47 mg/dL — ABNORMAL HIGH (ref 0.61–1.24)
Creatinine, Ser: 1.59 mg/dL — ABNORMAL HIGH (ref 0.61–1.24)
GFR, Estimated: 49 mL/min — ABNORMAL LOW (ref >60)
GFR, Estimated: 45 mL/min — ABNORMAL LOW (ref >60)
Glucose, Bld: 108 mg/dL — ABNORMAL HIGH (ref 70–99)
Glucose, Bld: 199 mg/dL — ABNORMAL HIGH (ref 70–99)
Potassium: 2.7 mmol/L — CL (ref 3.5–5.1)
Potassium: 3.1 mmol/L — ABNORMAL LOW (ref 3.5–5.1)
Sodium: 143 mmol/L (ref 135–145)
Sodium: 143 mmol/L (ref 135–145)

## 2022-09-20 LAB — GLUCOSE, CAPILLARY: Glucose-Capillary: 163 mg/dL — ABNORMAL HIGH (ref 70–99)

## 2022-09-20 LAB — BRAIN NATRIURETIC PEPTIDE: B Natriuretic Peptide: 1408.9 pg/mL — ABNORMAL HIGH (ref 0.0–100.0)

## 2022-09-20 LAB — CBG MONITORING, ED: Glucose-Capillary: 230 mg/dL — ABNORMAL HIGH (ref 70–99)

## 2022-09-20 LAB — TROPONIN I (HIGH SENSITIVITY)
Troponin I (High Sensitivity): 79 ng/L — ABNORMAL HIGH (ref <18)
Troponin I (High Sensitivity): 61 ng/L — ABNORMAL HIGH (ref <18)

## 2022-09-20 LAB — PHOSPHORUS: Phosphorus: 2.4 mg/dL — ABNORMAL LOW (ref 2.5–4.6)

## 2022-09-20 LAB — MAGNESIUM
Magnesium: 2.5 mg/dL — ABNORMAL HIGH (ref 1.7–2.4)
Magnesium: 2.4 mg/dL (ref 1.7–2.4)

## 2022-09-20 LAB — RESP PANEL BY RT-PCR (FLU A&B, COVID) ARPGX2
Influenza A by PCR: NEGATIVE
Influenza B by PCR: NEGATIVE
SARS Coronavirus 2 by RT PCR: NEGATIVE

## 2022-09-20 LAB — D-DIMER, QUANTITATIVE: D-Dimer, Quant: 1.09 ug/mL-FEU — ABNORMAL HIGH (ref 0.00–0.50)

## 2022-09-20 MED ORDER — POTASSIUM CHLORIDE CRYS ER 20 MEQ PO TBCR
40.0000 meq | EXTENDED_RELEASE_TABLET | Freq: Once | ORAL | Status: AC
  Administered 2022-09-20: 40 meq via ORAL
  Filled 2022-09-20: qty 2

## 2022-09-20 MED ORDER — HEPARIN SODIUM (PORCINE) 5000 UNIT/ML IJ SOLN
5000.0000 [IU] | Freq: Three times a day (TID) | INTRAMUSCULAR | Status: DC
  Administered 2022-09-20 – 2022-09-23 (×8): 5000 [IU] via SUBCUTANEOUS
  Filled 2022-09-20 (×8): qty 1

## 2022-09-20 MED ORDER — ACETAMINOPHEN 325 MG PO TABS: 650.0000 mg | ORAL_TABLET | Freq: Four times a day (QID) | ORAL | Status: DC | PRN

## 2022-09-20 MED ORDER — ACETAMINOPHEN 650 MG RE SUPP: 650.0000 mg | Freq: Four times a day (QID) | RECTAL | Status: DC | PRN

## 2022-09-20 MED ORDER — CARVEDILOL 6.25 MG PO TABS
6.2500 mg | ORAL_TABLET | Freq: Two times a day (BID) | ORAL | Status: DC
  Administered 2022-09-20 – 2022-09-22 (×4): 6.25 mg via ORAL
  Filled 2022-09-20 (×5): qty 1

## 2022-09-20 MED ORDER — ONDANSETRON HCL 4 MG/2ML IJ SOLN: 4.0000 mg | Freq: Four times a day (QID) | INTRAMUSCULAR | Status: DC | PRN

## 2022-09-20 MED ORDER — SPIRONOLACTONE 25 MG PO TABS
25.0000 mg | ORAL_TABLET | Freq: Every day | ORAL | Status: DC
  Filled 2022-09-20: qty 2

## 2022-09-20 MED ORDER — POTASSIUM CHLORIDE 10 MEQ/100ML IV SOLN
10.0000 meq | INTRAVENOUS | Status: AC
  Administered 2022-09-20 (×4): 10 meq via INTRAVENOUS
  Filled 2022-09-20 (×4): qty 100

## 2022-09-20 MED ORDER — INSULIN ASPART 100 UNIT/ML IJ SOLN
0.0000 [IU] | Freq: Every day | INTRAMUSCULAR | Status: DC
  Administered 2022-09-23 – 2022-09-24 (×2): 2 [IU] via SUBCUTANEOUS

## 2022-09-20 MED ORDER — HYDRALAZINE HCL 50 MG PO TABS
50.0000 mg | ORAL_TABLET | Freq: Three times a day (TID) | ORAL | Status: DC
  Administered 2022-09-21: 50 mg via ORAL
  Filled 2022-09-20 (×2): qty 1
  Filled 2022-09-20: qty 2

## 2022-09-20 MED ORDER — POTASSIUM CHLORIDE CRYS ER 20 MEQ PO TBCR
20.0000 meq | EXTENDED_RELEASE_TABLET | Freq: Once | ORAL | Status: AC
  Administered 2022-09-20: 20 meq via ORAL
  Filled 2022-09-20: qty 1

## 2022-09-20 MED ORDER — FUROSEMIDE 10 MG/ML IJ SOLN
40.0000 mg | Freq: Once | INTRAMUSCULAR | Status: AC
  Administered 2022-09-20: 40 mg via INTRAVENOUS
  Filled 2022-09-20: qty 4

## 2022-09-20 MED ORDER — AMLODIPINE BESY-BENAZEPRIL HCL 10-40 MG PO CAPS: 1.0000 | ORAL_CAPSULE | Freq: Every day | ORAL | Status: DC

## 2022-09-20 MED ORDER — INSULIN ASPART 100 UNIT/ML IJ SOLN
0.0000 [IU] | Freq: Three times a day (TID) | INTRAMUSCULAR | Status: DC
  Administered 2022-09-20: 3 [IU] via SUBCUTANEOUS
  Administered 2022-09-21 – 2022-09-23 (×7): 2 [IU] via SUBCUTANEOUS
  Administered 2022-09-23: 5 [IU] via SUBCUTANEOUS
  Administered 2022-09-24: 3 [IU] via SUBCUTANEOUS
  Administered 2022-09-24: 5 [IU] via SUBCUTANEOUS
  Administered 2022-09-24: 7 [IU] via SUBCUTANEOUS
  Administered 2022-09-25: 2 [IU] via SUBCUTANEOUS
  Administered 2022-09-25: 7 [IU] via SUBCUTANEOUS

## 2022-09-20 MED ORDER — AMLODIPINE BESYLATE 10 MG PO TABS
10.0000 mg | ORAL_TABLET | Freq: Every day | ORAL | Status: DC
  Administered 2022-09-21: 10 mg via ORAL
  Filled 2022-09-20: qty 1

## 2022-09-20 MED ORDER — BENAZEPRIL HCL 20 MG PO TABS
40.0000 mg | ORAL_TABLET | Freq: Every day | ORAL | Status: DC
  Filled 2022-09-20: qty 1

## 2022-09-20 MED ORDER — ALBUTEROL SULFATE HFA 108 (90 BASE) MCG/ACT IN AERS
1.0000 | INHALATION_SPRAY | RESPIRATORY_TRACT | Status: DC
  Administered 2022-09-20: 1 via RESPIRATORY_TRACT
  Filled 2022-09-20: qty 6.7

## 2022-09-20 MED ORDER — ONDANSETRON HCL 4 MG PO TABS: 4.0000 mg | ORAL_TABLET | Freq: Four times a day (QID) | ORAL | Status: DC | PRN

## 2022-09-20 MED ORDER — METFORMIN HCL ER 500 MG PO TB24
500.0000 mg | ORAL_TABLET | Freq: Two times a day (BID) | ORAL | Status: DC
  Administered 2022-09-20: 500 mg via ORAL
  Filled 2022-09-20 (×2): qty 1

## 2022-09-20 MED ORDER — FUROSEMIDE 10 MG/ML IJ SOLN
40.0000 mg | Freq: Two times a day (BID) | INTRAMUSCULAR | Status: DC
  Administered 2022-09-21: 40 mg via INTRAVENOUS
  Filled 2022-09-20 (×2): qty 4

## 2022-09-20 MED ORDER — CLOPIDOGREL BISULFATE 75 MG PO TABS
75.0000 mg | ORAL_TABLET | Freq: Every day | ORAL | Status: DC
  Administered 2022-09-21 – 2022-09-25 (×5): 75 mg via ORAL
  Filled 2022-09-20 (×6): qty 1

## 2022-09-20 MED ORDER — PANTOPRAZOLE SODIUM 40 MG PO TBEC
40.0000 mg | DELAYED_RELEASE_TABLET | Freq: Every day | ORAL | Status: DC
  Administered 2022-09-21 – 2022-09-25 (×5): 40 mg via ORAL
  Filled 2022-09-20 (×5): qty 1

## 2022-09-20 MED ORDER — ALBUTEROL SULFATE (2.5 MG/3ML) 0.083% IN NEBU
2.5000 mg | INHALATION_SOLUTION | RESPIRATORY_TRACT | Status: DC | PRN
  Administered 2022-09-21 – 2022-09-22 (×4): 2.5 mg via RESPIRATORY_TRACT
  Filled 2022-09-20 (×4): qty 3

## 2022-09-20 NOTE — ED Notes (Signed)
Pt brought into room and hooked up to monitor by this RN. Resop labored. RA O2 87%. Rn placed [place] on pt 1LPM

## 2022-09-20 NOTE — Telephone Encounter (Signed)
Pt's daughter called - her dad has more SOB can only speak 2-3 words without having to stop despite taking extra lasix yesterday.   With the significant SOB they will head to eR - most likely [place] the closest to them.  Daughter agreeable.

## 2022-09-20 NOTE — ED Notes (Signed)
CRITICAL VALUE STICKER  CRITICAL VALUE:K+ 2.7  RECEIVER (on-site recipient of call):Shawnie Pons, RN  DATE & TIME NOTIFIED:   MESSENGER (representative from lab):  MD NOTIFIED: Dr. Armandina Gemma  TIME OF NOTIFICATION:1207  RESPONSE:

## 2022-09-20 NOTE — ED Provider Notes (Signed)
[facility] EMERGENCY DEPT Provider Note   CSN: [id number] Arrival date & time: 09/20/22  1058     History  Chief Complaint  Patient presents with   Shortness of Breath   Leg Swelling    [name] is a 76 y.o. male.   Shortness of Breath    76 year old male with medical history significant for HLD, HTN, CAD with prior CABG in 2003, history of CHF with diastolic dysfunction, DM 2 who presents to the emergency department with shortness of breath.  The patient has been compliant with his home diuretics.  Over the last few days he has had worsening dyspnea on exertion, orthopnea, bilateral lower extremity edema.  He denies any cough, fever or chills.  He denies any chest pain.  He states he is still making urine.  He has been having fluctuating weight changes but feels fluid overloaded.  His PCP recommended him to come to the emergency department for IV diuretics and admission.  Home Medications Prior to Admission medications   Medication Sig Start Date End Date Taking? Authorizing Provider  albuterol (VENTOLIN HFA) 108 (90 Base) MCG/ACT inhaler Inhale 1 puff into the lungs as directed. 07/02/21   [provider]  amLODipine-benazepril (LOTREL) 10-40 MG capsule Take 1 capsule by mouth once daily 02/20/22   Nahser, Wonda Cheng, MD  aspirin EC 81 MG tablet Take 81 mg by mouth daily.    [provider]  carvedilol (COREG) 6.25 MG tablet Take 1 tablet (6.25 mg total) by mouth 2 (two) times daily. 09/30/21   Nahser, Wonda Cheng, MD  clopidogrel (PLAVIX) 75 MG tablet Take 1 tablet by mouth once daily 09/04/22   Nahser, Wonda Cheng, MD  eplerenone (INSPRA) 50 MG tablet Take 1 tablet (50 mg total) by mouth daily. 07/18/22   Nahser, Wonda Cheng, MD  furosemide (LASIX) 20 MG tablet Take 1 tablet by mouth only as needed for swelling, along with the 40 mg tablets. 07/18/22   Nahser, Wonda Cheng, MD  furosemide (LASIX) 40 MG tablet Take 1 tablet (40 mg total) by mouth daily. 06/16/22    Nahser, Wonda Cheng, MD  hydrALAZINE (APRESOLINE) 50 MG tablet Take 1 tablet (50 mg total) by mouth 3 (three) times daily. 12/19/21   Nahser, Wonda Cheng, MD  insulin NPH Human (NOVOLIN N) 100 UNIT/ML injection Inject 45 Units into the skin 2 (two) times daily before a meal. 09/08/19   [provider]  insulin regular (NOVOLIN R) 100 units/mL injection as directed. 09/08/19   [provider]  metFORMIN (GLUCOPHAGE-XR) 500 MG 24 hr tablet Take 500 mg by mouth 2 (two) times daily with a meal.  06/16/13   [provider]  nitroGLYCERIN (NITROSTAT) 0.4 MG SL tablet Place 1 tablet (0.4 mg total) under the tongue every 5 (five) minutes as needed for chest pain. 08/31/20   Nahser, Wonda Cheng, MD  pantoprazole (PROTONIX) 20 MG tablet Take 1 tablet by mouth once daily 06/10/22   Nahser, Wonda Cheng, MD  rosuvastatin (CRESTOR) 20 MG tablet Take 1 tablet by mouth once daily 08/18/22   Nahser, Wonda Cheng, MD      Allergies    Lipitor [atorvastatin calcium], Sulfa antibiotics, Sulfa drugs cross reactors, Sulfamethoxazole-trimethoprim, and Codeine    Review of Systems   Review of Systems  Respiratory:  Positive for shortness of breath.   All other systems reviewed and are negative.   Physical Exam Updated Vital Signs BP (!) 111/59   Pulse 60   Temp  97.6 F (36.4 C) (Oral)   Resp 18   Ht '5\' 11"'$  (1.803 m)   Wt 82.1 kg   SpO2 94%   BMI 25.24 kg/m  Physical Exam Vitals and nursing note reviewed.  Constitutional:      General: He is not in acute distress.    Appearance: He is well-developed.  HENT:     Head: Normocephalic and atraumatic.  Eyes:     Conjunctiva/sclera: Conjunctivae normal.  Cardiovascular:     Rate and Rhythm: Normal rate and regular rhythm.     Heart sounds: No murmur heard. Pulmonary:     Effort: Pulmonary effort is normal. No respiratory distress.     Breath sounds: Rales present.  Abdominal:     Palpations: Abdomen is soft.     Tenderness: There is no  abdominal tenderness.  Musculoskeletal:        General: No swelling.     Cervical back: Neck supple.     Right lower leg: Edema present.     Left lower leg: Edema present.     Comments: 1+ bilateral pitting edema to the ankles  Skin:    General: Skin is warm and dry.     Capillary Refill: Capillary refill takes less than 2 seconds.  Neurological:     Mental Status: He is alert.  Psychiatric:        Mood and Affect: Mood normal.     ED Results / Procedures / Treatments   Labs (all labs ordered are listed, but only abnormal results are displayed) Labs Reviewed  CBC WITH DIFFERENTIAL/PLATELET - Abnormal; Notable for the following components:      Result Value   RBC 3.88 (*)    Hemoglobin 10.3 (*)    HCT 33.7 (*)    RDW 16.4 (*)    All other components within normal limits  BASIC METABOLIC PANEL - Abnormal; Notable for the following components:   Potassium 2.7 (*)    Glucose, Bld 108 (*)    Creatinine, Ser 1.47 (*)    Calcium 8.8 (*)    GFR, Estimated 49 (*)    All other components within normal limits  BRAIN NATRIURETIC PEPTIDE - Abnormal; Notable for the following components:   B Natriuretic Peptide 1,408.9 (*)    All other components within normal limits  MAGNESIUM - Abnormal; Notable for the following components:   Magnesium 2.5 (*)    All other components within normal limits  TROPONIN I (HIGH SENSITIVITY) - Abnormal; Notable for the following components:   Troponin I (High Sensitivity) 79 (*)    All other components within normal limits  RESP PANEL BY RT-PCR (FLU A&B, COVID) [id number]    EKG   Radiology DG Chest 2 View  Result Date: 09/20/2022 CLINICAL DATA:  Shortness of breath.  CHF. EXAM: CHEST - 2 VIEW COMPARISON:  Chest x-ray November 30, 2021 FINDINGS: Stable cardiomegaly. Sternotomy wires are intact. The hila and mediastinum are unremarkable. No pneumothorax. No nodules or masses. Bilateral pulmonary opacities appear to be primarily interstitial bilateral  pleural effusions. IMPRESSION: Findings are most consistent with cardiomegaly, small effusions, and mild pulmonary edema. Electronically Signed   By: Dorise Bullion III M.D.   On: 09/20/2022 12:54    Procedures Procedures    Medications Ordered in ED Medications  potassium chloride 10 mEq in 100 mL IVPB (10 mEq Intravenous New Bag/Given 09/20/22 1348)  furosemide (LASIX) injection 40 mg (40 mg Intravenous Given 09/20/22 1244)  potassium chloride SA (KLOR-CON M) CR  tablet 40 mEq (40 mEq Oral Given 09/20/22 1242)    ED Course/ Medical Decision Making/ A&P                           Medical Decision Making Amount and/or Complexity of Data Reviewed Labs: ordered. Radiology: ordered.  Risk Prescription drug management. Decision regarding hospitalization.    76 year old male with medical history significant for HLD, HTN, CAD with prior CABG in 2003, history of CHF with diastolic dysfunction, DM 2 who presents to the emergency department with shortness of breath.  The patient has been compliant with his home diuretics.  Over the last few days he has had worsening dyspnea on exertion, orthopnea, bilateral lower extremity edema.  He denies any cough, fever or chills.  He denies any chest pain.  He states he is still making urine.  He has been having fluctuating weight changes but feels fluid overloaded.  His PCP recommended him to come to the emergency department for IV diuretics and admission.  On arrival, the patient was afebrile, not tachycardic or tachypneic, BP 107/68, saturating 90% on room air, subsequently desaturated to 88% and placed on 3 L O2 escalated to 4 L O2 via nasal cannula.  Physical exam concerning for hypervolemia and CHF with 1+ pitting edema bilaterally, rales present, no hypoxia.  Concern for CHF exacerbation.  Patient denies any chest pain, lower concern for ACS.  Low concern for PE.  Lower concern for but considered pneumonia, pneumothorax, COVID-19 and  influenza.  Initial EKG revealed Sinus rhythm, first-degree AV block, nonspecific T wave changes inferiorly, and no significant changes from prior EKGs.  Chest x-ray was performed which was reviewed by myself and radiology and was concerning for a megaly, pulmonary edema and bilateral small pleural effusions.  Was administered IV Lasix 40 mg.  His laboratory evaluation was concerning for hypokalemia to 2.7, replenished orally and IV.  His BNP was elevated to 1408, COVID-19 influenza PCR testing was ordered and pending, magnesium was normal, CBC without leukocytosis, stable anemia to 10.3.  Hospitalist medicine was consulted for admission for CHF exacerbation, electrolyte replenishment and further diuresis.  Dr. Lorin Mercy of hospitalist medicine was consulted and accepted the patient in admission.  The patient was hemodynamically stable at time of admission.  Final Clinical Impression(s) / ED Diagnoses Final diagnoses:  Acute on chronic congestive heart failure, unspecified heart failure type ([place])  Acute respiratory failure with hypoxia [facility])    Rx / DC Orders ED Discharge Orders     None         Regan Lemming, MD 09/20/22 1416

## 2022-09-20 NOTE — H&P (Incomplete)
History and Physical    [name] WGY:[id number] DOB: [birth year] DOA: 09/20/2022  PCP: Haywood Pao, MD  Patient coming from: [place]  I have personally briefly reviewed patient's old medical records in [place]  Chief Complaint: CHF exacerbation   HPI: [name] is a 76 y.o. male with medical history significant of  h/o CAD s/p CABG, PAD s/p vascular intervention right leg 2015,, DM, HTN, HLD, CHFpef with chronic DOE,and prostate CA presenting with SOB and edema, O2 sat 87% on RA.  Per patient no dietary indiscretion, also denies noncompliance with medications. He notes symptoms on going for 2 days worse over the last 24 hours. He notes no cough , fever/ chills/ n/v/d/ or abdominal pain. He does note increase swelling in lower extremity edema progressive over last 48 hours. He also denies sick contacts.  ED Course:  In ED  New O2 requirement 4L -increase to 8L, BNP 1400, CXR with edema.  Vitals  afeb, bp 107/68-97/53, hr 72, sat 96% , decrease to 88% plac eond 4L up to 93% -. 97% on high flow 8L EKG: sinus 1st degree av block RAD, IVCD Labs: Wbc 5.4, hgb 10.3 ( 11.4),  NA 143, K 2.7, cr 1.47 ( base around 1) Bnp 1408.9 Mag 2.5 Respiratory panel:neg Ce 79,61 Cxr: Findings are most consistent with cardiomegaly, small effusions, and mild pulmonary edema.  Tx lasix, KCL, coreg, lotrel, ppi, spironolactone, plavix,hydralazine Review of Systems: As per HPI otherwise 10 point review of systems negative.   Past Medical History:  Diagnosis Date   Arthritis    CAD (coronary artery disease)    Carotid artery occlusion    Diabetes mellitus    fasting 100-120s   Dizziness    Dysrhythmia    skips beats   GERD (gastroesophageal reflux disease)    History of kidney stones    Hyperlipidemia    Hypertension    IHD (ischemic heart disease)    Prior CABG in 2003   Normal nuclear stress test June 2012   Peripheral vascular disease ([place])    Prostate cancer ([place])     Torn rotator cuff     Past Surgical History:  Procedure Laterality Date   ABDOMINAL AORTAGRAM N/A 12/21/2013   Procedure: ABDOMINAL Maxcine Ham;  Surgeon: Elam Dutch, MD;  Location: [facility] CATH LAB;  Service: Cardiovascular;  Laterality: N/A;   ABDOMINAL AORTOGRAM N/A 11/13/2017   Procedure: ABDOMINAL AORTOGRAM;  Surgeon: Elam Dutch, MD;  Location: [place] CV LAB;  Service: Cardiovascular;  Laterality: N/A;   APPENDECTOMY     ARCH AORTOGRAM  08/26/2013   Procedure: ARCH AORTOGRAM;  Surgeon: Elam Dutch, MD;  Location: [facility] CATH LAB;  Service: Cardiovascular;;   CARDIAC CATHETERIZATION  05/03/2002   EF 40-45%   CARDIOVASCULAR STRESS TEST  08/10/2006   EF 65%   CAROTID ENDARTERECTOMY     CAROTID STENT INSERTION Left 08/26/2013   Procedure: CAROTID STENT INSERTION;  Surgeon: Elam Dutch, MD;  Location: [facility] CATH LAB;  Service: Cardiovascular;  Laterality: Left;  internal carotid   CORONARY ARTERY BYPASS GRAFT  04/2002   DOPPLER ECHOCARDIOGRAPHY  08/12/2002   EF 80-85%   ENDARTERECTOMY Left 07/20/2013   Procedure: ATTEMPTED ENDARTERECTOMY CAROTID;  Surgeon: Conrad Vale, MD;  Location: [place];  Service: Vascular;  Laterality: Left;   HERNIA REPAIR     LOWER EXTREMITY ANGIOGRAPHY  11/13/2017   Procedure: Lower Extremity Angiography;  Surgeon: Elam Dutch, MD;  Location: [place]  CV LAB;  Service: Cardiovascular;;   PERIPHERAL VASCULAR INTERVENTION Right 11/13/2017   Procedure: PERIPHERAL VASCULAR INTERVENTION;  Surgeon: Elam Dutch, MD;  Location: [place] CV LAB;  Service: Cardiovascular;  Laterality: Right;  superficial femoral   SHOULDER SURGERY       reports that he quit smoking about 20 years ago. His smoking use included cigarettes. He has a 20.00 pack-year smoking history. He has never been exposed to tobacco smoke. He has never used smokeless tobacco. He reports that he does not drink alcohol and does not use drugs.  Allergies  Allergen Reactions    Lipitor [Atorvastatin Calcium] Other (See Comments)    Muscle ache   Sulfa Antibiotics     Other reaction(s): Unknown   Sulfa Drugs Cross Reactors Other (See Comments)    "raw spots"   Sulfamethoxazole-Trimethoprim     Other reaction(s): Rash   Codeine Rash    Family History  Problem Relation Age of Onset   Heart attack Mother    Diabetes Mother    Heart disease Mother    Hypertension Mother    Heart attack Father    Heart disease Father    Hypertension Father    Diabetes Brother    Diabetes Sister    Diabetes Daughter    Heart disease Daughter    Hypertension Daughter    Diabetes Sister    Breast cancer Neg Hx    Prostate cancer Neg Hx    Colon cancer Neg Hx    Pancreatic cancer Neg Hx     Prior to Admission medications   Medication Sig Start Date End Date Taking? Authorizing Provider  albuterol (VENTOLIN HFA) 108 (90 Base) MCG/ACT inhaler Inhale 1 puff into the lungs as directed. 07/02/21   [provider]  amLODipine-benazepril (LOTREL) 10-40 MG capsule Take 1 capsule by mouth once daily 02/20/22   Nahser, Wonda Cheng, MD  aspirin EC 81 MG tablet Take 81 mg by mouth daily.    [provider]  carvedilol (COREG) 6.25 MG tablet Take 1 tablet (6.25 mg total) by mouth 2 (two) times daily. 09/30/21   Nahser, Wonda Cheng, MD  clopidogrel (PLAVIX) 75 MG tablet Take 1 tablet by mouth once daily 09/04/22   Nahser, Wonda Cheng, MD  eplerenone (INSPRA) 50 MG tablet Take 1 tablet (50 mg total) by mouth daily. 07/18/22   Nahser, Wonda Cheng, MD  furosemide (LASIX) 20 MG tablet Take 1 tablet by mouth only as needed for swelling, along with the 40 mg tablets. 07/18/22   Nahser, Wonda Cheng, MD  furosemide (LASIX) 40 MG tablet Take 1 tablet (40 mg total) by mouth daily. 06/16/22   Nahser, Wonda Cheng, MD  hydrALAZINE (APRESOLINE) 50 MG tablet Take 1 tablet (50 mg total) by mouth 3 (three) times daily. 12/19/21   Nahser, Wonda Cheng, MD  insulin NPH Human (NOVOLIN N) 100 UNIT/ML injection Inject 45  Units into the skin 2 (two) times daily before a meal. 09/08/19   [provider]  insulin regular (NOVOLIN R) 100 units/mL injection as directed. 09/08/19   [provider]  metFORMIN (GLUCOPHAGE-XR) 500 MG 24 hr tablet Take 500 mg by mouth 2 (two) times daily with a meal.  06/16/13   [provider]  nitroGLYCERIN (NITROSTAT) 0.4 MG SL tablet Place 1 tablet (0.4 mg total) under the tongue every 5 (five) minutes as needed for chest pain. 08/31/20   Nahser, Wonda Cheng, MD  pantoprazole (PROTONIX) 20 MG tablet Take 1 tablet by  mouth once daily 06/10/22   Nahser, Wonda Cheng, MD  rosuvastatin (CRESTOR) 20 MG tablet Take 1 tablet by mouth once daily 08/18/22   Nahser, Wonda Cheng, MD    Physical Exam: Vitals:   09/20/22 1900 09/20/22 1928 09/20/22 1944 09/20/22 2024  BP: (!) 106/47   125/61  Pulse: (!) 57   63  Resp: 16   20  Temp:   97.8 F (36.6 C) 97.8 F (36.6 C)  TempSrc:   Oral Oral  SpO2: 94% 97%  95%  Weight:    83.4 kg  Height:    '5\' 11"'$  (1.803 m)    Constitutional: NAD, calm, comfortable Vitals:   09/20/22 1900 09/20/22 1928 09/20/22 1944 09/20/22 2024  BP: (!) 106/47   125/61  Pulse: (!) 57   63  Resp: 16   20  Temp:   97.8 F (36.6 C) 97.8 F (36.6 C)  TempSrc:   Oral Oral  SpO2: 94% 97%  95%  Weight:    83.4 kg  Height:    '5\' 11"'$  (1.803 m)   Eyes: PERRL, lids and conjunctivae normal ENMT: Mucous membranes are moist. Posterior pharynx clear of any exudate or lesions.Normal dentition.  Neck: normal, supple, no masses, no thyromegaly Respiratory: decrease bs bilaterally, no wheezing, no crackles. Normal respiratory effort. No accessory muscle use.  Cardiovascular: Regular rate and rhythm, no murmurs / rubs / gallops. + extremity edema. 2+ pedal pulses. Abdomen: no tenderness, no masses palpated. No hepatosplenomegaly. Bowel sounds positive.  Musculoskeletal: no clubbing / cyanosis. No joint deformity upper and lower extremities. Good ROM, no  contractures. Normal muscle tone.  Skin: no rashes, lesions, ulcers. No induration Neurologic: CN 2-12 grossly intact. Sensation intact,Strength 5/5 in all 4.  Psychiatric: Normal judgment and insight. Alert and oriented x 3. Normal mood.    Labs on Admission: I have personally reviewed following labs and imaging studies  CBC: Recent Labs  Lab 09/20/22 1129  WBC 5.4  NEUTROABS 3.9  HGB 10.3*  HCT 33.7*  MCV 86.9  PLT 401   Basic Metabolic Panel: Recent Labs  Lab 09/20/22 1129  NA 143  K 2.7*  CL 104  CO2 25  GLUCOSE 108*  BUN 21  CREATININE 1.47*  CALCIUM 8.8*  MG 2.5*   GFR: Estimated Creatinine Clearance: 46.2 mL/min (A) (by C-G formula based on SCr of 1.47 mg/dL (H)). Liver Function Tests: No results for input(s): "AST", "ALT", "ALKPHOS", "BILITOT", "PROT", "ALBUMIN" in the last 168 hours. No results for input(s): "LIPASE", "AMYLASE" in the last 168 hours. No results for input(s): "AMMONIA" in the last 168 hours. Coagulation Profile: No results for input(s): "INR", "PROTIME" in the last 168 hours. Cardiac Enzymes: No results for input(s): "CKTOTAL", "CKMB", "CKMBINDEX", "TROPONINI" in the last 168 hours. BNP (last 3 results) No results for input(s): "PROBNP" in the last 8760 hours. HbA1C: No results for input(s): "HGBA1C" in the last 72 hours. CBG: Recent Labs  Lab 09/20/22 1710  GLUCAP 230*   Lipid Profile: No results for input(s): "CHOL", "HDL", "LDLCALC", "TRIG", "CHOLHDL", "LDLDIRECT" in the last 72 hours. Thyroid Function Tests: No results for input(s): "TSH", "T4TOTAL", "FREET4", "T3FREE", "THYROIDAB" in the last 72 hours. Anemia Panel: No results for input(s): "VITAMINB12", "FOLATE", "FERRITIN", "TIBC", "IRON", "RETICCTPCT" in the last 72 hours. Urine analysis:    Component Value Date/Time   COLORURINE YELLOW 07/19/2013 [place] 07/19/2013 1223   LABSPEC 1.013 07/19/2013 1223   PHURINE 5.5 07/19/2013 1223   GLUCOSEU 500 (  A)  07/19/2013 1223   HGBUR NEGATIVE 07/19/2013 1223   BILIRUBINUR NEGATIVE 07/19/2013 1223   KETONESUR NEGATIVE 07/19/2013 1223   PROTEINUR NEGATIVE 07/19/2013 1223   UROBILINOGEN 1.0 07/19/2013 1223   NITRITE NEGATIVE 07/19/2013 1223   LEUKOCYTESUR NEGATIVE 07/19/2013 1223    Radiological Exams on Admission: DG Chest 2 View  Result Date: 09/20/2022 CLINICAL DATA:  Shortness of breath.  CHF. EXAM: CHEST - 2 VIEW COMPARISON:  Chest x-ray November 30, 2021 FINDINGS: Stable cardiomegaly. Sternotomy wires are intact. The hila and mediastinum are unremarkable. No pneumothorax. No nodules or masses. Bilateral pulmonary opacities appear to be primarily interstitial bilateral pleural effusions. IMPRESSION: Findings are most consistent with cardiomegaly, small effusions, and mild pulmonary edema. Electronically Signed   By: Dorise Bullion III M.D.   On: 09/20/2022 12:54    EKG: Independently reviewed. Repeat EKG pending otherwise see above  Assessment/Plan  Acute on chronic diastolic heart failure with acute hypoxic respiratory failure  -lasix 40 mg iv bid  -strict  I/o daily wt  - continue with GDMT - echo in am  -ekg without hyperacute st -twave changes ,repeat pending -cardiology consult in am   Severe hypoxemia  -appears out of proportion to CHF especially s/p tx  Recent hx of CAP -s/p tx 1 week ago  - CT chest/V/Q to evaluate for further occult causes   Recent DX COPD -continue controller regimen  -abg to be complete  -no wheezing on exam  -prn nebs    h/o CAD s/p CABG -continue on home regimen  -plavix/statin/asa  PAD  -s/p vascular intervention right leg 2015 -no active issues -continue on plavix , asa    DM -iss/fs  -last A1c  7.5 -resume long acting insulin    HTN -stable  -continue on home regimen once med rec completed    HLD -continue statin    Prostate CA  -continue to follow with urology   Anemia -hgb trending down over last week months  - check  anemia labs  -monitor h/h   DVT prophylaxis: heparin Code Status: DNR Family Communication: dtr at bedside Disposition Plan: patient  expected to be admitted greater than 2 midnights  Consults called: n/a/consider pulmonary if not improved  Admission status: progressive     If 7PM-7AM, please contact night-coverage [email or web address] Password [facility]  09/20/2022, 8:52 PM

## 2022-09-20 NOTE — ED Notes (Signed)
Pt's O2 continues to drop on 1L. Placed on 3L. RT notified

## 2022-09-20 NOTE — ED Notes (Signed)
Pt complaints of increase SOB. Dr. Melina Copa placing order for more lasix and RT consulted to do high flow O2

## 2022-09-20 NOTE — ED Notes (Signed)
[facility] here for transport. Floor RN notified

## 2022-09-20 NOTE — Plan of Care (Signed)

## 2022-09-20 NOTE — Progress Notes (Signed)
Patient's SPO2 has dropped to 92%.  Patient says he is have troubled breathing, is orthopneic,  and is labored on 5 liter nasal cannula.  Place patient on [place] 8 liter nasal cannula.  Patient's WOB has decreased and patient states that he is breathing better.  Patient's SPO2 is 97%.  RT will continue to monitor.

## 2022-09-20 NOTE — Progress Notes (Signed)
Plan of Care Note for accepted transfer   Patient: [name] MRN: [id number]   DOA: 09/20/2022  Facility requesting transfer: [facility] Requesting Provider: Armandina Gemma Reason for transfer: CHF exacerbation  Facility course: Patient with h/o CAD s/p CABG, DM, HTN, HLD, and prostate CA presenting with SOB and edema, O2 sat 87% on RA.  New O2 requirement, 4L.  Compliant with Lasix.  Giving K+.  BNP 1400, CXR with edema.  Will admit to telemetry.     Plan of care: The patient is accepted for admission to Telemetry unit, at [facility].   Author: Karmen Bongo, MD 09/20/2022  Check [email or web address] for on-call coverage.  Nursing staff, Please call [place] number on Amion as soon as patient's arrival, so appropriate admitting provider can evaluate the pt.

## 2022-09-20 NOTE — ED Triage Notes (Signed)
Patient arrives with complaints of shortness of breath and increased leg swelling x2 days. Patient is currently taking his lasix as prescribed, but his he is having fluctuating weight changes (170's-180's).   Patient spoke to his PCP who recommended him to come here for IV diuretics.  Reports no pain.

## 2022-09-20 NOTE — ED Notes (Signed)
Patient transported to X-ray 

## 2022-09-21 ENCOUNTER — Encounter (HOSPITAL_COMMUNITY): Payer: Self-pay | Admitting: Internal Medicine

## 2022-09-21 ENCOUNTER — Inpatient Hospital Stay (HOSPITAL_COMMUNITY): Payer: Medicare HMO

## 2022-09-21 DIAGNOSIS — I5033 Acute on chronic diastolic (congestive) heart failure: Secondary | ICD-10-CM | POA: Diagnosis not present

## 2022-09-21 LAB — ECHOCARDIOGRAM COMPLETE
AR max vel: 1.76 cm2
AV Area VTI: 1.95 cm2
AV Area mean vel: 1.86 cm2
AV Mean grad: 3.5 mmHg
AV Peak grad: 8.4 mmHg
Ao pk vel: 1.45 m/s
Area-P 1/2: 4.39 cm2
Calc EF: 27.9 %
Height: 71 in
MV M vel: 2.55 m/s
MV Peak grad: 26.1 mmHg
P 1/2 time: 709 msec
S' Lateral: 4.3 cm
Single Plane A2C EF: 12.8 %
Single Plane A4C EF: 36.9 %
Weight: 2939.2 oz

## 2022-09-21 LAB — RESPIRATORY PANEL BY PCR

## 2022-09-21 LAB — CBC
HCT: 30.4 % — ABNORMAL LOW (ref 39.0–52.0)
Hemoglobin: 9.2 g/dL — ABNORMAL LOW (ref 13.0–17.0)
MCH: 26.7 pg (ref 26.0–34.0)
MCHC: 30.3 g/dL (ref 30.0–36.0)
MCV: 88.4 fL (ref 80.0–100.0)
Platelets: 196 10*3/uL (ref 150–400)
RBC: 3.44 MIL/uL — ABNORMAL LOW (ref 4.22–5.81)
RDW: 16.3 % — ABNORMAL HIGH (ref 11.5–15.5)
WBC: 5 10*3/uL (ref 4.0–10.5)
nRBC: 0 % (ref 0.0–0.2)

## 2022-09-21 LAB — BLOOD GAS, VENOUS
Acid-base deficit: 0.3 mmol/L (ref 0.0–2.0)
Bicarbonate: 24.1 mmol/L (ref 20.0–28.0)
Drawn by: 66167
O2 Saturation: 88.4 %
Patient temperature: 37.1
pCO2, Ven: 38 mmHg — ABNORMAL LOW (ref 44–60)
pH, Ven: 7.41 (ref 7.25–7.43)
pO2, Ven: 57 mmHg — ABNORMAL HIGH (ref 32–45)

## 2022-09-21 LAB — GLUCOSE, CAPILLARY
Glucose-Capillary: 153 mg/dL — ABNORMAL HIGH (ref 70–99)
Glucose-Capillary: 180 mg/dL — ABNORMAL HIGH (ref 70–99)
Glucose-Capillary: 179 mg/dL — ABNORMAL HIGH (ref 70–99)
Glucose-Capillary: 190 mg/dL — ABNORMAL HIGH (ref 70–99)

## 2022-09-21 LAB — COMPREHENSIVE METABOLIC PANEL
ALT: 12 U/L (ref 0–44)
AST: 16 U/L (ref 15–41)
Albumin: 2.7 g/dL — ABNORMAL LOW (ref 3.5–5.0)
Alkaline Phosphatase: 53 U/L (ref 38–126)
Anion gap: 10 (ref 5–15)
BUN: 18 mg/dL (ref 8–23)
CO2: 22 mmol/L (ref 22–32)
Calcium: 8.3 mg/dL — ABNORMAL LOW (ref 8.9–10.3)
Chloride: 110 mmol/L (ref 98–111)
Creatinine, Ser: 1.57 mg/dL — ABNORMAL HIGH (ref 0.61–1.24)
GFR, Estimated: 46 mL/min — ABNORMAL LOW (ref >60)
Glucose, Bld: 189 mg/dL — ABNORMAL HIGH (ref 70–99)
Potassium: 2.9 mmol/L — ABNORMAL LOW (ref 3.5–5.1)
Sodium: 142 mmol/L (ref 135–145)
Total Bilirubin: 0.5 mg/dL (ref 0.3–1.2)
Total Protein: 5.9 g/dL — ABNORMAL LOW (ref 6.5–8.1)

## 2022-09-21 LAB — IRON AND TIBC
Iron: 29 ug/dL — ABNORMAL LOW (ref 45–182)
Saturation Ratios: 9 % — ABNORMAL LOW (ref 17.9–39.5)
TIBC: 314 ug/dL (ref 250–450)
UIBC: 285 ug/dL

## 2022-09-21 LAB — FERRITIN: Ferritin: 26 ng/mL (ref 24–336)

## 2022-09-21 LAB — FOLATE: Folate: 13.1 ng/mL (ref >5.9)

## 2022-09-21 LAB — RETICULOCYTES
Immature Retic Fract: 27 % — ABNORMAL HIGH (ref 2.3–15.9)
RBC.: 3.49 MIL/uL — ABNORMAL LOW (ref 4.22–5.81)
Retic Count, Absolute: 90.7 10*3/uL (ref 19.0–186.0)
Retic Ct Pct: 2.6 % (ref 0.4–3.1)

## 2022-09-21 LAB — VITAMIN B12: Vitamin B-12: 104 pg/mL — ABNORMAL LOW (ref 180–914)

## 2022-09-21 MED ORDER — PERFLUTREN LIPID MICROSPHERE
1.0000 mL | INTRAVENOUS | Status: AC | PRN
  Administered 2022-09-21: 2 mL via INTRAVENOUS

## 2022-09-21 MED ORDER — FLUTICASONE FUROATE-VILANTEROL 100-25 MCG/ACT IN AEPB
1.0000 | INHALATION_SPRAY | Freq: Every day | RESPIRATORY_TRACT | Status: DC
  Administered 2022-09-21: 1 via RESPIRATORY_TRACT
  Filled 2022-09-21: qty 28

## 2022-09-21 MED ORDER — UMECLIDINIUM BROMIDE 62.5 MCG/ACT IN AEPB
1.0000 | INHALATION_SPRAY | Freq: Every day | RESPIRATORY_TRACT | Status: DC
  Administered 2022-09-21: 1 via RESPIRATORY_TRACT
  Filled 2022-09-21: qty 7

## 2022-09-21 MED ORDER — EMPAGLIFLOZIN 10 MG PO TABS
10.0000 mg | ORAL_TABLET | Freq: Every day | ORAL | Status: DC
  Administered 2022-09-21 – 2022-09-22 (×2): 10 mg via ORAL
  Filled 2022-09-21 (×2): qty 1

## 2022-09-21 MED ORDER — FUROSEMIDE 10 MG/ML IJ SOLN
20.0000 mg | Freq: Two times a day (BID) | INTRAMUSCULAR | Status: DC
  Administered 2022-09-21 – 2022-09-22 (×3): 20 mg via INTRAVENOUS
  Filled 2022-09-21 (×3): qty 2

## 2022-09-21 MED ORDER — POTASSIUM CHLORIDE CRYS ER 20 MEQ PO TBCR
40.0000 meq | EXTENDED_RELEASE_TABLET | Freq: Three times a day (TID) | ORAL | Status: AC
  Administered 2022-09-21 (×3): 40 meq via ORAL
  Filled 2022-09-21 (×3): qty 2

## 2022-09-21 MED ORDER — AMLODIPINE BESYLATE 5 MG PO TABS: 5.0000 mg | ORAL_TABLET | Freq: Every day | ORAL | Status: DC

## 2022-09-21 NOTE — Progress Notes (Addendum)
PROGRESS NOTE    [name]  DXI:[id number] DOB: 1946/02/03 DOA: 09/20/2022 PCP: Haywood Pao, MD  [name] is a 76 y.o. male with medical history significant of  h/o CAD s/p CABG, chronic diastolic CHF, COPD, PAD s/p vascular intervention right leg 2015,, DM, HTN, HLD,  prostate CA presenting with SOB and edema, O2 sat 87% on RA.  New O2 requirement, 4L.  Compliant with Lasix.    BNP 1400, CXR with edema.   Subjective: -Feels better, breathing starting to improve  Assessment and Plan:  Acute hypoxic resp failure Acute on chronic diastolic heart failure  Hypoalbumonemia -last ECHo 9/22 w/ normal EF, grade 2 DD, normal RV, mod AI -on IV lasix '40mg'$  BID now, 1.5 L negative -Add Jardiance, further GDMT as kidney function tolerates -FU repeat ECHO -Monitor/I's/O, daily weights   h/o CAD s/p CABG 2003 -continue plavix/BB   PAD  -s/p vascular intervention right leg 2015 -continue plavix    DM -CBG stable, continue sliding scale insulin, hold metformin -last A1c  7.5   Hypertension -Hold benazepril, continue amlodipine   HLD -continue statin     Prostate CA  -continue to follow with urology    Anemia -hgb trending down over last week months  -Check anemia   DVT prophylaxis: hep SQ Code Status: Full Code Family Communication: Discussed with patient daughter at bedside Disposition Plan: Home likely 48 hours if EF is preserved  Consultants:    Procedures:   Antimicrobials:    Objective: Vitals:   09/21/22 0100 09/21/22 0200 09/21/22 0233 09/21/22 0416  BP: (!) 113/44   121/66  Pulse: 67 (!) 55 (!) 52 60  Resp: (!) 21 (!) '21 20 16  '$ Temp:    98.4 F (36.9 C)  TempSrc:    Oral  SpO2: 94% 90% (!) 87% 93%  Weight:    83.3 kg  Height:        Intake/Output Summary (Last 24 hours) at 09/21/2022 7673 Last data filed at 09/21/2022 0415 Gross per 24 hour  Intake --  Output 1555 ml  Net -1555 ml   Filed Weights   09/20/22 1117 09/20/22 2024  09/21/22 0416  Weight: 82.1 kg 83.4 kg 83.3 kg    Examination:  General exam: Elderly male sitting up in bed, AAO x 3, mild cognitive deficits HEENT: Positive JVD CVS: S1-S2, regular rhythm Lungs: Fine bilateral rales Abdomen: Soft, nontender, bowel sounds present Extremities: 1+ edema Skin: No rashes Psychiatry:  Mood & affect appropriate.     Data Reviewed:   CBC: Recent Labs  Lab 09/20/22 1129 09/20/22 2235 09/21/22 0008  WBC 5.4 4.8 5.0  NEUTROABS 3.9  --   --   HGB 10.3* 9.9* 9.2*  HCT 33.7* 32.0* 30.4*  MCV 86.9 87.9 88.4  PLT 244 198 419   Basic Metabolic Panel: Recent Labs  Lab 09/20/22 1129 09/20/22 2235 09/21/22 0008  NA 143 143 142  K 2.7* 3.1* 2.9*  CL 104 109 110  CO2 '25 22 22  '$ GLUCOSE 108* 199* 189*  BUN '21 18 18  '$ CREATININE 1.47* 1.59* 1.57*  CALCIUM 8.8* 8.5* 8.3*  MG 2.5* 2.4  --   PHOS  --  2.4*  --    GFR: Estimated Creatinine Clearance: 43.3 mL/min (A) (by C-G formula based on SCr of 1.57 mg/dL (H)). Liver Function Tests: Recent Labs  Lab 09/21/22 0008  AST 16  ALT 12  ALKPHOS 53  BILITOT 0.5  PROT 5.9*  ALBUMIN 2.7*  No results for input(s): "LIPASE", "AMYLASE" in the last 168 hours. No results for input(s): "AMMONIA" in the last 168 hours. Coagulation Profile: No results for input(s): "INR", "PROTIME" in the last 168 hours. Cardiac Enzymes: No results for input(s): "CKTOTAL", "CKMB", "CKMBINDEX", "TROPONINI" in the last 168 hours. BNP (last 3 results) No results for input(s): "PROBNP" in the last 8760 hours. HbA1C: No results for input(s): "HGBA1C" in the last 72 hours. CBG: Recent Labs  Lab 09/20/22 1710 09/20/22 2131  GLUCAP 230* 163*   Lipid Profile: No results for input(s): "CHOL", "HDL", "LDLCALC", "TRIG", "CHOLHDL", "LDLDIRECT" in the last 72 hours. Thyroid Function Tests: No results for input(s): "TSH", "T4TOTAL", "FREET4", "T3FREE", "THYROIDAB" in the last 72 hours. Anemia Panel: No results for  input(s): "VITAMINB12", "FOLATE", "FERRITIN", "TIBC", "IRON", "RETICCTPCT" in the last 72 hours. Urine analysis:    Component Value Date/Time   COLORURINE YELLOW 07/19/2013 [place] 07/19/2013 1223   LABSPEC 1.013 07/19/2013 1223   PHURINE 5.5 07/19/2013 1223   GLUCOSEU 500 (A) 07/19/2013 1223   HGBUR NEGATIVE 07/19/2013 1223   BILIRUBINUR NEGATIVE 07/19/2013 1223   KETONESUR NEGATIVE 07/19/2013 1223   PROTEINUR NEGATIVE 07/19/2013 1223   UROBILINOGEN 1.0 07/19/2013 1223   NITRITE NEGATIVE 07/19/2013 1223   LEUKOCYTESUR NEGATIVE 07/19/2013 1223   Sepsis Labs: '@LABRCNTIP'$ (procalcitonin:4,lacticidven:4)  ) Recent Results (from the past 240 hour(s))  Resp Panel by RT-PCR (Flu A&B, Covid) Anterior Nasal Swab     Status: None   Collection Time: 09/20/22  1:54 PM   Specimen: Anterior Nasal Swab  Result Value Ref Range Status   SARS Coronavirus 2 by RT PCR NEGATIVE NEGATIVE Final    Comment: (NOTE) SARS-CoV-2 target nucleic acids are NOT DETECTED.  The SARS-CoV-2 RNA is generally detectable in upper respiratory specimens during the acute phase of infection. The lowest concentration of SARS-CoV-2 viral copies this assay can detect is 138 copies/mL. A negative result does not preclude SARS-Cov-2 infection and should not be used as the sole basis for treatment or other patient management decisions. A negative result may occur with  improper specimen collection/handling, submission of specimen other than nasopharyngeal swab, presence of viral mutation(s) within the areas targeted by this assay, and inadequate number of viral copies(<138 copies/mL). A negative result must be combined with clinical observations, patient history, and epidemiological information. The expected result is Negative.  Fact Sheet for Patients:  EntrepreneurPulse.com.au  Fact Sheet for Healthcare Providers:  IncredibleEmployment.be  This test is no t yet  approved or cleared by the [place] FDA and  has been authorized for detection and/or diagnosis of SARS-CoV-2 by FDA under an Emergency Use Authorization (EUA). This EUA will remain  in effect (meaning this test can be used) for the duration of the COVID-19 declaration under Section 564(b)(1) of the Act, 21 U.S.C.section 360bbb-3(b)(1), unless the authorization is terminated  or revoked sooner.       Influenza A by PCR NEGATIVE NEGATIVE Final   Influenza B by PCR NEGATIVE NEGATIVE Final    Comment: (NOTE) The Xpert Xpress SARS-CoV-2/FLU/RSV plus assay is intended as an aid in the diagnosis of influenza from Nasopharyngeal swab specimens and should not be used as a sole basis for treatment. Nasal washings and aspirates are unacceptable for Xpert Xpress SARS-CoV-2/FLU/RSV testing.  Fact Sheet for Patients: EntrepreneurPulse.com.au  Fact Sheet for Healthcare Providers: IncredibleEmployment.be  This test is not yet approved or cleared by the [place] FDA and has been authorized for detection and/or diagnosis of SARS-CoV-2 by  FDA under an Emergency Use Authorization (EUA). This EUA will remain in effect (meaning this test can be used) for the duration of the COVID-19 declaration under Section 564(b)(1) of the Act, 21 U.S.C. section 360bbb-3(b)(1), unless the authorization is terminated or revoked.  Performed at [facility], [address] [place], [place] [address]      Radiology Studies: DG Chest 2 View  Result Date: 09/20/2022 CLINICAL DATA:  Shortness of breath.  CHF. EXAM: CHEST - 2 VIEW COMPARISON:  Chest x-ray November 30, 2021 FINDINGS: Stable cardiomegaly. Sternotomy wires are intact. The hila and mediastinum are unremarkable. No pneumothorax. No nodules or masses. Bilateral pulmonary opacities appear to be primarily interstitial bilateral pleural effusions. IMPRESSION: Findings are most consistent with  cardiomegaly, small effusions, and mild pulmonary edema. Electronically Signed   By: Dorise Bullion III M.D.   On: 09/20/2022 12:54     Scheduled Meds:  albuterol  1 puff Inhalation UD   amLODipine  10 mg Oral Daily   And   benazepril  40 mg Oral Daily   carvedilol  6.25 mg Oral BID   clopidogrel  75 mg Oral Daily   furosemide  40 mg Intravenous Q12H   heparin  5,000 Units Subcutaneous Q8H   hydrALAZINE  50 mg Oral TID   insulin aspart  0-5 Units Subcutaneous QHS   insulin aspart  0-9 Units Subcutaneous TID WC   metFORMIN  500 mg Oral BID WC   pantoprazole  40 mg Oral Daily   potassium chloride  40 mEq Oral TID   Continuous Infusions:   LOS: 1 day    Time spent: 48mn    PDomenic Polite MD [facility]   09/21/2022, 6:08 AM

## 2022-09-21 NOTE — Progress Notes (Signed)
Pt converted to sinus. MD notified, EKG done to confirm Will continue to monitor.

## 2022-09-21 NOTE — Progress Notes (Signed)
  Echocardiogram 2D Echocardiogram has been performed.  [name] 09/21/2022, 2:54 PM

## 2022-09-21 NOTE — Care Plan (Signed)
0600 will need RN and RT assist for transport to CT but RN unable to bring at this time

## 2022-09-22 ENCOUNTER — Other Ambulatory Visit (HOSPITAL_COMMUNITY): Payer: Self-pay

## 2022-09-22 ENCOUNTER — Encounter (HOSPITAL_COMMUNITY): Payer: Self-pay | Admitting: Internal Medicine

## 2022-09-22 ENCOUNTER — Inpatient Hospital Stay: Payer: Self-pay

## 2022-09-22 DIAGNOSIS — N179 Acute kidney failure, unspecified: Secondary | ICD-10-CM

## 2022-09-22 DIAGNOSIS — D649 Anemia, unspecified: Secondary | ICD-10-CM

## 2022-09-22 DIAGNOSIS — I5033 Acute on chronic diastolic (congestive) heart failure: Secondary | ICD-10-CM | POA: Diagnosis not present

## 2022-09-22 DIAGNOSIS — N1831 Chronic kidney disease, stage 3a: Secondary | ICD-10-CM

## 2022-09-22 LAB — HEMOGLOBIN A1C
Hgb A1c MFr Bld: 7 % — ABNORMAL HIGH (ref 4.8–5.6)
Mean Plasma Glucose: 154 mg/dL

## 2022-09-22 LAB — HEPATIC FUNCTION PANEL
ALT: 14 U/L (ref 0–44)
AST: 17 U/L (ref 15–41)
Albumin: 3.2 g/dL — ABNORMAL LOW (ref 3.5–5.0)
Alkaline Phosphatase: 58 U/L (ref 38–126)
Bilirubin, Direct: 0.2 mg/dL (ref 0.0–0.2)
Indirect Bilirubin: 0.7 mg/dL (ref 0.3–0.9)
Total Bilirubin: 0.9 mg/dL (ref 0.3–1.2)
Total Protein: 7 g/dL (ref 6.5–8.1)

## 2022-09-22 LAB — CBC
HCT: 31.9 % — ABNORMAL LOW (ref 39.0–52.0)
Hemoglobin: 9.8 g/dL — ABNORMAL LOW (ref 13.0–17.0)
MCH: 26.8 pg (ref 26.0–34.0)
MCHC: 30.7 g/dL (ref 30.0–36.0)
MCV: 87.2 fL (ref 80.0–100.0)
Platelets: 220 10*3/uL (ref 150–400)
RBC: 3.66 MIL/uL — ABNORMAL LOW (ref 4.22–5.81)
RDW: 16 % — ABNORMAL HIGH (ref 11.5–15.5)
WBC: 6.9 10*3/uL (ref 4.0–10.5)
nRBC: 0 % (ref 0.0–0.2)

## 2022-09-22 LAB — GLUCOSE, CAPILLARY
Glucose-Capillary: 164 mg/dL — ABNORMAL HIGH (ref 70–99)
Glucose-Capillary: 195 mg/dL — ABNORMAL HIGH (ref 70–99)
Glucose-Capillary: 164 mg/dL — ABNORMAL HIGH (ref 70–99)
Glucose-Capillary: 185 mg/dL — ABNORMAL HIGH (ref 70–99)

## 2022-09-22 LAB — BASIC METABOLIC PANEL
Anion gap: 13 (ref 5–15)
BUN: 22 mg/dL (ref 8–23)
CO2: 19 mmol/L — ABNORMAL LOW (ref 22–32)
Calcium: 8.9 mg/dL (ref 8.9–10.3)
Chloride: 108 mmol/L (ref 98–111)
Creatinine, Ser: 1.77 mg/dL — ABNORMAL HIGH (ref 0.61–1.24)
GFR, Estimated: 40 mL/min — ABNORMAL LOW (ref >60)
Glucose, Bld: 173 mg/dL — ABNORMAL HIGH (ref 70–99)
Potassium: 3.7 mmol/L (ref 3.5–5.1)
Sodium: 140 mmol/L (ref 135–145)

## 2022-09-22 LAB — LACTIC ACID, PLASMA
Lactic Acid, Venous: 1.4 mmol/L (ref 0.5–1.9)
Lactic Acid, Venous: 1.8 mmol/L (ref 0.5–1.9)

## 2022-09-22 MED ORDER — FUROSEMIDE 10 MG/ML IJ SOLN: 60.0000 mg | Freq: Two times a day (BID) | INTRAMUSCULAR | Status: DC

## 2022-09-22 MED ORDER — ROSUVASTATIN CALCIUM 20 MG PO TABS
20.0000 mg | ORAL_TABLET | Freq: Every day | ORAL | Status: DC
  Administered 2022-09-22 – 2022-09-25 (×4): 20 mg via ORAL
  Filled 2022-09-22 (×4): qty 1

## 2022-09-22 MED ORDER — FLUTICASONE FUROATE-VILANTEROL 100-25 MCG/ACT IN AEPB
1.0000 | INHALATION_SPRAY | Freq: Every day | RESPIRATORY_TRACT | Status: DC
  Administered 2022-09-23 – 2022-09-25 (×3): 1 via RESPIRATORY_TRACT

## 2022-09-22 MED ORDER — METOLAZONE 5 MG PO TABS
5.0000 mg | ORAL_TABLET | Freq: Once | ORAL | Status: AC
  Administered 2022-09-22: 5 mg via ORAL
  Filled 2022-09-22: qty 1

## 2022-09-22 MED ORDER — POTASSIUM CHLORIDE CRYS ER 20 MEQ PO TBCR
40.0000 meq | EXTENDED_RELEASE_TABLET | Freq: Once | ORAL | Status: AC
  Administered 2022-09-22: 40 meq via ORAL
  Filled 2022-09-22: qty 2

## 2022-09-22 MED ORDER — UMECLIDINIUM BROMIDE 62.5 MCG/ACT IN AEPB
1.0000 | INHALATION_SPRAY | Freq: Every day | RESPIRATORY_TRACT | Status: DC
  Administered 2022-09-23 – 2022-09-25 (×3): 1 via RESPIRATORY_TRACT

## 2022-09-22 MED ORDER — FUROSEMIDE 10 MG/ML IJ SOLN
80.0000 mg | Freq: Once | INTRAMUSCULAR | Status: AC
  Administered 2022-09-22: 80 mg via INTRAVENOUS
  Filled 2022-09-22: qty 8

## 2022-09-22 MED ORDER — ASPIRIN 81 MG PO CHEW
81.0000 mg | CHEWABLE_TABLET | Freq: Every day | ORAL | Status: DC
  Administered 2022-09-22 – 2022-09-25 (×4): 81 mg via ORAL
  Filled 2022-09-22 (×4): qty 1

## 2022-09-22 MED ORDER — FUROSEMIDE 10 MG/ML IJ SOLN
80.0000 mg | Freq: Two times a day (BID) | INTRAMUSCULAR | Status: DC
  Administered 2022-09-23: 80 mg via INTRAVENOUS
  Filled 2022-09-22: qty 8

## 2022-09-22 NOTE — Consult Note (Addendum)
Advanced Heart Failure Team Consult Note   Primary Physician: [name], [name], MD PCP-Cardiologist:  Mertie Moores, MD  Reason for Consultation: cute on chronic combined systolic and diastolic CHF  HPI:    [name] is seen today for evaluation of cute on chronic combined systolic and diastolic CHF at the request of Dr. Cathie Olden with cardiology.   [name] is a 76 y.o. male with CAD c/p CABG 3149, chronic diastolic CHF, chronic DOE, Rt CEA >44yr ago, Lt CEA 14', HTN, DM2, HLD, PAD w/ Rt SFA and Rt politeal artery vascular stents 15', prostate cancer.   [name] to the ED with SOB and BLE edema. Reports symptoms started ~2 days prior to admission. Had 48 hours of worsening SOB and noticed significant swelling in BLE. He reports taking additional lasix with no relief. His normal weight is 177-179lbs and the day he came in he was up to 185lbs. Reports compliance with all medications except hydralazine he cut back to once a day 2/2 bp's at home being in the 80s/90s. Hasn't been able to sleep 2/2 prthopnea. At baseline sleeps flat and walks around without fatigue or SOB. Denies drinking/smoking. Denies CP.   In the ED he was 87% on RA, BNP 1408, K 2.7, Mg 2.5, Cr 1.6 (baseline around 1), HsTrop 79>61, CXR with mild pulmonary edema, BP stable/soft, EKG sinus w/ 1st degree AV block. Diuresed with IV lasix and placed on supplemental O2.   On assessment, warm to touch, still slightly volume overloaded. Daughter at bedside. Denies CP. Still SOB with activity but comfortable at rest.   Echo 09/21/22: EF 30-35%, mild concentric LVH, GIIDD, RV function mildly reduced, mild MR, mild to mod TR, mild aortic valve regurg Echo 9/22: EF 60-65%, normal RV function, LA mildly elevated, trivial MR/TR, mild-mod aortic valve regurg    Home Medications Prior to Admission medications   Medication Sig Start Date End Date Taking? Authorizing Provider  albuterol (VENTOLIN HFA) 108 (90 Base)  MCG/ACT inhaler Inhale 2 puffs into the lungs every 6 (six) hours as needed for wheezing or shortness of breath. 07/02/21  Yes [provider]  amLODipine-benazepril (LOTREL) 10-40 MG capsule Take 1 capsule by mouth once daily 02/20/22  Yes Nahser, PWonda Cheng MD  aspirin EC 81 MG tablet Take 81 mg by mouth daily.   Yes [provider]  carvedilol (COREG) 6.25 MG tablet Take 1 tablet (6.25 mg total) by mouth 2 (two) times daily. 09/30/21  Yes Nahser, PWonda Cheng MD  Cholecalciferol (VITAMIN D-3) 125 MCG (5000 UT) TABS Take 5,000 Units by mouth daily.   Yes [provider]  clopidogrel (PLAVIX) 75 MG tablet Take 1 tablet by mouth once daily 09/04/22  Yes Nahser, PWonda Cheng MD  eplerenone (INSPRA) 50 MG tablet Take 1 tablet (50 mg total) by mouth daily. 07/18/22  Yes Nahser, PWonda Cheng MD  furosemide (LASIX) 20 MG tablet Take 1 tablet by mouth only as needed for swelling, along with the 40 mg tablets. 07/18/22  Yes Nahser, PWonda Cheng MD  furosemide (LASIX) 40 MG tablet Take 1 tablet (40 mg total) by mouth daily. 06/16/22  Yes Nahser, PWonda Cheng MD  hydrALAZINE (APRESOLINE) 50 MG tablet Take 1 tablet (50 mg total) by mouth 3 (three) times daily. Patient taking differently: Take 50-100 mg by mouth See admin instructions. Take 1 tablet by mouth in the morning, then he may take another if it's needed. But he doesn't take as directed due to  hypotension. 12/19/21  Yes Nahser, Wonda Cheng, MD  insulin NPH Human (NOVOLIN N) 100 UNIT/ML injection Inject 35-40 Units into the skin See admin instructions. Inject 40 units into the skin in the morning and 35 units at night 09/08/19  Yes [provider]  insulin regular (NOVOLIN R) 100 units/mL injection Inject 6-15 Units into the skin 3 (three) times daily before meals. Per sliding scale 09/08/19  Yes [provider]  metFORMIN (GLUCOPHAGE-XR) 500 MG 24 hr tablet Take 500 mg by mouth 2 (two) times daily with a meal.  06/16/13  Yes [provider]  pantoprazole (PROTONIX) 20 MG tablet Take 1 tablet by mouth once daily 06/10/22  Yes Nahser, Wonda Cheng, MD  rosuvastatin (CRESTOR) 20 MG tablet Take 1 tablet by mouth once daily Patient taking differently: Take 20 mg by mouth daily. 08/18/22  Yes Nahser, Wonda Cheng, MD  nitroGLYCERIN (NITROSTAT) 0.4 MG SL tablet Place 1 tablet (0.4 mg total) under the tongue every 5 (five) minutes as needed for chest pain. Patient not taking: Reported on 09/20/2022 08/31/20   Nahser, Wonda Cheng, MD    Past Medical History: Past Medical History:  Diagnosis Date   Arthritis    CAD (coronary artery disease)    Carotid artery occlusion    Diabetes mellitus    fasting 100-120s   Dizziness    Dysrhythmia    skips beats   GERD (gastroesophageal reflux disease)    History of kidney stones    Hyperlipidemia    Hypertension    IHD (ischemic heart disease)    Prior CABG in 2003   Normal nuclear stress test June 2012   Peripheral vascular disease ([place])    Prostate cancer ([place])    Torn rotator cuff     Past Surgical History: Past Surgical History:  Procedure Laterality Date   ABDOMINAL AORTAGRAM N/A 12/21/2013   Procedure: ABDOMINAL Maxcine Ham;  Surgeon: Elam Dutch, MD;  Location: [facility] CATH LAB;  Service: Cardiovascular;  Laterality: N/A;   ABDOMINAL AORTOGRAM N/A 11/13/2017   Procedure: ABDOMINAL AORTOGRAM;  Surgeon: Elam Dutch, MD;  Location: [place] CV LAB;  Service: Cardiovascular;  Laterality: N/A;   APPENDECTOMY     ARCH AORTOGRAM  08/26/2013   Procedure: ARCH AORTOGRAM;  Surgeon: Elam Dutch, MD;  Location: [facility] CATH LAB;  Service: Cardiovascular;;   CARDIAC CATHETERIZATION  05/03/2002   EF 40-45%   CARDIOVASCULAR STRESS TEST  08/10/2006   EF 65%   CAROTID ENDARTERECTOMY     CAROTID STENT INSERTION Left 08/26/2013   Procedure: CAROTID STENT INSERTION;  Surgeon: Elam Dutch, MD;  Location: [facility] CATH LAB;  Service: Cardiovascular;  Laterality: Left;  internal carotid    CORONARY ARTERY BYPASS GRAFT  04/2002   DOPPLER ECHOCARDIOGRAPHY  08/12/2002   EF 80-85%   ENDARTERECTOMY Left 07/20/2013   Procedure: ATTEMPTED ENDARTERECTOMY CAROTID;  Surgeon: Conrad Sonora, MD;  Location: [place];  Service: Vascular;  Laterality: Left;   HERNIA REPAIR     LOWER EXTREMITY ANGIOGRAPHY  11/13/2017   Procedure: Lower Extremity Angiography;  Surgeon: Elam Dutch, MD;  Location: [place] CV LAB;  Service: Cardiovascular;;   PERIPHERAL VASCULAR INTERVENTION Right 11/13/2017   Procedure: PERIPHERAL VASCULAR INTERVENTION;  Surgeon: Elam Dutch, MD;  Location: [place] CV LAB;  Service: Cardiovascular;  Laterality: Right;  superficial femoral   SHOULDER SURGERY      Family History: Family History  Problem Relation Age of Onset   Heart attack Mother  Diabetes Mother    Heart disease Mother    Hypertension Mother    Heart attack Father    Heart disease Father    Hypertension Father    Diabetes Brother    Diabetes Sister    Diabetes Daughter    Heart disease Daughter    Hypertension Daughter    Diabetes Sister    Breast cancer Neg Hx    Prostate cancer Neg Hx    Colon cancer Neg Hx    Pancreatic cancer Neg Hx     Social History: Social History   Socioeconomic History   Marital status: Married    Spouse name: Not on file   Number of children: 2   Years of education: Not on file   Highest education level: Not on file  Occupational History   Not on file  Tobacco Use   Smoking status: Former    Packs/day: 1.00    Years: 20.00    Total pack years: 20.00    Types: Cigarettes    Quit date: 10/27/2001    Years since quitting: 20.9    Passive exposure: Never   Smokeless tobacco: Never  Vaping Use   Vaping Use: Never used  Substance and Sexual Activity   Alcohol use: No    Alcohol/week: 0.0 standard drinks of alcohol   Drug use: No   Sexual activity: Not Currently  Other Topics Concern   Not on file  Social History Narrative   Not on file    Social Determinants of Health   Financial Resource Strain: Not on file  Food Insecurity: No Food Insecurity (09/21/2022)   Hunger Vital Sign    Worried About Running Out of Food in the Last Year: Never true    Ran Out of Food in the Last Year: Never true  Transportation Needs: No Transportation Needs (09/21/2022)   PRAPARE - Hydrologist (Medical): No    Lack of Transportation (Non-Medical): No  Physical Activity: Not on file  Stress: Not on file  Social Connections: Not on file    Allergies:  Allergies  Allergen Reactions   Aldactone [Spironolactone] Other (See Comments)    Swollen breast   Lipitor [Atorvastatin Calcium] Other (See Comments)    Muscle ache   Sulfa Drugs Cross Reactors Other (See Comments)    "raw spots"   Sulfamethoxazole-Trimethoprim     Other reaction(s): Rash   Codeine Rash    Objective:    Vital Signs:   Temp:  [97.6 F (36.4 C)-98.4 F (36.9 C)] 97.6 F (36.4 C) (11/27 1558) Pulse Rate:  [66-81] 74 (11/27 1558) Resp:  [17-20] 18 (11/27 1558) BP: (107-130)/(48-90) 107/90 (11/27 1558) SpO2:  [83 %-95 %] 93 % (11/27 1700) Weight:  [83.1 kg] 83.1 kg (11/27 0418) Last BM Date : 09/21/22  Weight change: Filed Weights   09/20/22 2024 09/21/22 0416 09/22/22 0418  Weight: 83.4 kg 83.3 kg 83.1 kg    Intake/Output:   Intake/Output Summary (Last 24 hours) at 09/22/2022 1800 Last data filed at 09/22/2022 1700 Gross per 24 hour  Intake 720 ml  Output 2175 ml  Net -1455 ml      Physical Exam  General:  Well appearing.  HEENT: normal Neck: supple. JVP ~16 . Carotids 2+ bilat; no bruits. No lymphadenopathy or thyromegaly appreciated. Cor: PMI nondisplaced. Regular rate & rhythm. No rubs, gallops or murmurs. Lungs: diminished Abdomen: soft, nontender, nondistended. No hepatosplenomegaly. No bruits or masses. Good bowel sounds. Extremities: no cyanosis,  clubbing, rash, +2 BLE edema (+compression socks) Neuro:  alert & orientedx3, cranial nerves grossly intact. moves all 4 extremities w/o difficulty. Affect pleasant  Telemetry   NSR 70s w/ PVCs (Personally reviewed)    EKG    NSR with 1st degree AV block and PVCs 61 bpm  Labs   Basic Metabolic Panel: Recent Labs  Lab 09/20/22 1129 09/20/22 2235 09/21/22 0008 09/22/22 0044  NA 143 143 142 140  K 2.7* 3.1* 2.9* 3.7  CL 104 109 110 108  CO2 '25 22 22 '$ 19*  GLUCOSE 108* 199* 189* 173*  BUN '21 18 18 22  '$ CREATININE 1.47* 1.59* 1.57* 1.77*  CALCIUM 8.8* 8.5* 8.3* 8.9  MG 2.5* 2.4  --   --   PHOS  --  2.4*  --   --     Liver Function Tests: Recent Labs  Lab 09/21/22 0008  AST 16  ALT 12  ALKPHOS 53  BILITOT 0.5  PROT 5.9*  ALBUMIN 2.7*   No results for input(s): "LIPASE", "AMYLASE" in the last 168 hours. No results for input(s): "AMMONIA" in the last 168 hours.  CBC: Recent Labs  Lab 09/20/22 1129 09/20/22 2235 09/21/22 0008 09/22/22 0044  WBC 5.4 4.8 5.0 6.9  NEUTROABS 3.9  --   --   --   HGB 10.3* 9.9* 9.2* 9.8*  HCT 33.7* 32.0* 30.4* 31.9*  MCV 86.9 87.9 88.4 87.2  PLT 244 198 196 220    Cardiac Enzymes: No results for input(s): "CKTOTAL", "CKMB", "CKMBINDEX", "TROPONINI" in the last 168 hours.  BNP: BNP (last 3 results) Recent Labs    11/30/21 1853 09/20/22 1129  BNP 779.9* 1,408.9*    ProBNP (last 3 results) No results for input(s): "PROBNP" in the last 8760 hours.   CBG: Recent Labs  Lab 09/21/22 1609 09/21/22 2050 09/22/22 0628 09/22/22 1056 09/22/22 1555  GLUCAP 179* 190* 164* 195* 164*    Coagulation Studies: No results for input(s): "LABPROT", "INR" in the last 72 hours.   Imaging   [place] EKG SITE RITE  Result Date: 09/22/2022 If Site Rite image not attached, placement could not be confirmed due to current cardiac rhythm.    Medications:     Current Medications:  clopidogrel  75 mg Oral Daily   [START ON 09/23/2022] fluticasone furoate-vilanterol  1 puff Inhalation Daily    [START ON 09/23/2022] furosemide  60 mg Intravenous BID   heparin  5,000 Units Subcutaneous Q8H   insulin aspart  0-5 Units Subcutaneous QHS   insulin aspart  0-9 Units Subcutaneous TID WC   pantoprazole  40 mg Oral Daily   [START ON 09/23/2022] umeclidinium bromide  1 puff Inhalation Daily    Infusions:     Patient Profile   [name] is a 76 y.o. male with CAD c/p CABG 1443, chronic diastolic CHF, chronic DOE, Rt CEA >71yr ago, Lt CEA 14', HTN, DM2, HLD, PAD w/ Rt SFA and Rt politeal artery vascular stents 15', prostate cancer. AHF team asked to see for acute on chronic combined systolic and diastolic CHF, previously HFpEF now HFrEF, EF now 30-35%.   Assessment/Plan   Acute on chronic combined systolic and diastolic CHF, previously HFpEF now HFrEF - Echo 9/22: EF 60-65%, normal RV function, LA mildly elevated, trivial MR/TR, mild-mod aortic valve regurg - Echo EF 30-35%, mild concentric LVH, GIIDD, RV function mildly reduced, mild MR, mild to mod TR, mild aortic valve regurg - presented NYHA class III with SOB, orthopnea and BLE edema -  volume overloaded on admission suspect low output 2/2 ischemia?, COPD?, recently treated PNA?, PVCs, CO2 19, Cr up to 1.7 today - Still volume overloaded on assessment, will increase IV lasix 20>60 BID - hold BB with acute exacerbation - Can consider losartan/entresto once AKI resolved and BP improved - Hold eplerenone and Jardiance with AKI - Concern for low-output with worsening kidney function, may need inotropic support to help with diuresis - Place PICC, check CVP and trend co-ox - May need RHC once better diuresed and LHC once renal function improves to r/o ICM - strict I&O, daily weights CAD s/p CABG  - HsTrop 79>61, suspect demand ischemia with volume overload - No chest pain, may need LHC once renal function improves -Continue Plavix, ASA, and statin  AKI on CKD st 3a - likely cardiorenal, baseline Cr. 1 - Cr 1.7, monitor with  diuresis - Avoid hypotension Anemia - Tsat 9, ferritin 26, will need feraheme closer to d/c PAD - s/p intervention 15' Rt SFA and Rt politeal artery vascular stents - Continue plavix HTN - currently controlled, can add hydralazine if needed for BP control  DM - Continue SSI, Hgb A1c last 7.5 - per primary COPD - has  new patient appointment scheduled for 12/8 - Continue nebs  - recently treated for PNA - May need OP sleep study, refusing CPAP/BiPAP for now  Length of Stay: [place] AGACNP-BC  09/22/2022, 6:00 PM  Advanced Heart Failure Team Pager [phone] (M-F; 7a - 5p)  Please contact [facility] for night-coverage after hours (4p -7a ) and weekends on [email or web address]  Patient seen and examined with the above-signed Advanced Practice Provider and/or Housestaff. I personally reviewed laboratory data, imaging studies and relevant notes. I independently examined the patient and formulated the important aspects of the plan. I have edited the note to reflect any of my changes or salient points. I have personally discussed the plan with the patient and/or family.  76 y/o male as above with CAD s/p CABG, chronic diastolic HF, CKD 3a, DM2.   Has been doing quite well until several days ago. Developed progressive SOB and 5-7 pound weight gain not responding to oral diuretics.   In ER found to be in HF with elevated BNP and pulmonary edema on CXR. Has been diuresed but remains SOB at rest with orthopnea. Developing mild AKI.   Echo with EF down to 30-35%.   Urine bottle at bedside with clear urine  General:  Sitting in chair. SOB at rest HEENT: normal Neck: supple. JVP to jaw with prominent v waves CEA scar  No lymphadenopathy or thryomegaly appreciated. Cor: PMI nondisplaced. Regular rate & rhythm. 2/6 TR  Lungs: dull at bases R >L + crackles  Abdomen: soft, nontender, nondistended. No hepatosplenomegaly. No bruits or masses. Good bowel sounds. Extremities: no  cyanosis, clubbing, rash, 2-3+ edema Neuro: alert & orientedx3, cranial nerves grossly intact. moves all 4 extremities w/o difficulty. Affect pleasant  He has significant volume overload with NYHA IV symptoms. EF newly reduced. Hs trop normal.   Will increase diuretics with additional 80IV lasix now and metolazone 5. Will place PICC to follow CVP and co-ox.   Will eventually need repeat R/L cath. I have ordered Bipap now to help with respiratory discomfort while we diurese.   Glori Bickers, MD  7:05 PM

## 2022-09-22 NOTE — Progress Notes (Signed)
Spoke to RN, and made aware PICC will be done tom. 11/27.Pt has current PIV.

## 2022-09-22 NOTE — Consult Note (Addendum)
Cardiology Consultation   Patient ID: DEAKEN JURGENS MRN: [id number]; DOB: 26-Feb-1946  Admit date: 09/20/2022 Date of Consult: 09/22/2022  PCP:  Haywood Pao, MD   [facility] Cardiologist:  Mertie Moores, MD        Patient Profile:   [name] is a 76 y.o. male with a hx of CAD with CABG 2003, Rt CEA >18 years ago, Lt CEA 2014, HN, DM-2, HLD, vascular stent rt SFA, and Rt popliteal artery PTA in 2015 on Rt leg who is being seen 09/22/2022 for the evaluation of acute CHF at the request of Dr. Broadus [name].  History of Present Illness:   [name] with hx as above and CABG in 2003 LIMA to LAD, VG to diag, VG to LCx marginal, seq VG to PDA and PLA (hx of 99% stenosis of LM)  Echo 2022 with EF 60-65%, G2 DD. G2DD, severe hypokinesis near akinesis of LV , basal mid inf wall. Mild to mod AR no AS.  Does have chronic dyspnea.     Pt developed increased SOB on 09/19/22 had to sleep propped upon couch and by AM on the 25th and could only speak 2-3 words despite taking extra lasix the day before.  He presented to ER and sp02  was 87%, 02 was placed at 1 L and titrated to 3L.  K+ was 2.7 no chest pain.  Even now cannot lie flat or near to flat to sleep.  Mostly in Tripod position.  He has bee using nebulizer as well here.  He did have more salt over Thanksgiving and was eating chicken noodle soup.  No chest pain.   2V CXR Findings are most consistent with cardiomegaly, small effusions, and mild pulmonary edema BNP 1,408, hs troponin 79, 61  Today Na 140, K+ 3.7 glucose 173, Cr 1.77  Hgb 9.8 WBC 6.9 plts 220   EKG:  The EKG was personally reviewed and demonstrates:  SR with 1 st degree AV block  with increased lateral depression than prior and today with PACs.  Telemetry:  Telemetry was personally reviewed and demonstrates:  SR  Echo  with EF 30-35% down from 60-65% last year, mild LVH, W2HE -mild RV systolic function reduced, estimated RV systolic pressure 52.7 mmHg   mild MVR, mild to mod TE, mild AR   BP 115/58 P 58, 63 R 19, 16 afebrile   Pt placed on lasix 40 BID IV rec'd total 3 doses then lasix to 20 mg. IV BID  Neg 3,390  wt down 0.3Kg   Past Medical History:  Diagnosis Date   Arthritis    CAD (coronary artery disease)    Carotid artery occlusion    Diabetes mellitus    fasting 100-120s   Dizziness    Dysrhythmia    skips beats   GERD (gastroesophageal reflux disease)    History of kidney stones    Hyperlipidemia    Hypertension    IHD (ischemic heart disease)    Prior CABG in 2003   Normal nuclear stress test June 2012   Peripheral vascular disease (HCC)    Prostate cancer ([place])    Torn rotator cuff     Past Surgical History:  Procedure Laterality Date   ABDOMINAL AORTAGRAM N/A 12/21/2013   Procedure: ABDOMINAL Maxcine Ham;  Surgeon: Elam Dutch, MD;  Location: [facility] CATH LAB;  Service: Cardiovascular;  Laterality: N/A;   ABDOMINAL AORTOGRAM N/A 11/13/2017   Procedure: ABDOMINAL AORTOGRAM;  Surgeon: Elam Dutch,  MD;  Location: [place] CV LAB;  Service: Cardiovascular;  Laterality: N/A;   APPENDECTOMY     ARCH AORTOGRAM  08/26/2013   Procedure: ARCH AORTOGRAM;  Surgeon: Elam Dutch, MD;  Location: [facility] CATH LAB;  Service: Cardiovascular;;   CARDIAC CATHETERIZATION  05/03/2002   EF 40-45%   CARDIOVASCULAR STRESS TEST  08/10/2006   EF 65%   CAROTID ENDARTERECTOMY     CAROTID STENT INSERTION Left 08/26/2013   Procedure: CAROTID STENT INSERTION;  Surgeon: Elam Dutch, MD;  Location: [facility] CATH LAB;  Service: Cardiovascular;  Laterality: Left;  internal carotid   CORONARY ARTERY BYPASS GRAFT  04/2002   DOPPLER ECHOCARDIOGRAPHY  08/12/2002   EF 80-85%   ENDARTERECTOMY Left 07/20/2013   Procedure: ATTEMPTED ENDARTERECTOMY CAROTID;  Surgeon: Conrad [place], MD;  Location: [place];  Service: Vascular;  Laterality: Left;   HERNIA REPAIR     LOWER EXTREMITY ANGIOGRAPHY  11/13/2017   Procedure: Lower Extremity Angiography;   Surgeon: Elam Dutch, MD;  Location: [place] CV LAB;  Service: Cardiovascular;;   PERIPHERAL VASCULAR INTERVENTION Right 11/13/2017   Procedure: PERIPHERAL VASCULAR INTERVENTION;  Surgeon: Elam Dutch, MD;  Location: [place] CV LAB;  Service: Cardiovascular;  Laterality: Right;  superficial femoral   SHOULDER SURGERY       Home Medications:  Prior to Admission medications   Medication Sig Start Date End Date Taking? Authorizing Provider  albuterol (VENTOLIN HFA) 108 (90 Base) MCG/ACT inhaler Inhale 2 puffs into the lungs every 6 (six) hours as needed for wheezing or shortness of breath. 07/02/21  Yes [provider]  amLODipine-benazepril (LOTREL) 10-40 MG capsule Take 1 capsule by mouth once daily 02/20/22  Yes Cathyann Kilfoyle, Wonda Cheng, MD  aspirin EC 81 MG tablet Take 81 mg by mouth daily.   Yes [provider]  carvedilol (COREG) 6.25 MG tablet Take 1 tablet (6.25 mg total) by mouth 2 (two) times daily. 09/30/21  Yes Sudiksha Victor, Wonda Cheng, MD  Cholecalciferol (VITAMIN D-3) 125 MCG (5000 UT) TABS Take 5,000 Units by mouth daily.   Yes [provider]  clopidogrel (PLAVIX) 75 MG tablet Take 1 tablet by mouth once daily 09/04/22  Yes Devontre Siedschlag, Wonda Cheng, MD  eplerenone (INSPRA) 50 MG tablet Take 1 tablet (50 mg total) by mouth daily. 07/18/22  Yes Rico Massar, Wonda Cheng, MD  furosemide (LASIX) 20 MG tablet Take 1 tablet by mouth only as needed for swelling, along with the 40 mg tablets. 07/18/22  Yes Rishab Stoudt, Wonda Cheng, MD  furosemide (LASIX) 40 MG tablet Take 1 tablet (40 mg total) by mouth daily. 06/16/22  Yes Lynnann Knudsen, Wonda Cheng, MD  hydrALAZINE (APRESOLINE) 50 MG tablet Take 1 tablet (50 mg total) by mouth 3 (three) times daily. Patient taking differently: Take 50-100 mg by mouth See admin instructions. Take 1 tablet by mouth in the morning, then he may take another if it's needed. But he doesn't take as directed due to hypotension. 12/19/21  Yes Kanai Hilger, Wonda Cheng, MD  insulin NPH  Human (NOVOLIN N) 100 UNIT/ML injection Inject 35-40 Units into the skin See admin instructions. Inject 40 units into the skin in the morning and 35 units at night 09/08/19  Yes [provider]  insulin regular (NOVOLIN R) 100 units/mL injection Inject 6-15 Units into the skin 3 (three) times daily before meals. Per sliding scale 09/08/19  Yes [provider]  metFORMIN (GLUCOPHAGE-XR) 500 MG 24 hr tablet Take 500 mg by mouth 2 (two) times  daily with a meal.  06/16/13  Yes [provider]  pantoprazole (PROTONIX) 20 MG tablet Take 1 tablet by mouth once daily 06/10/22  Yes Wylan Gentzler, Wonda Cheng, MD  rosuvastatin (CRESTOR) 20 MG tablet Take 1 tablet by mouth once daily Patient taking differently: Take 20 mg by mouth daily. 08/18/22  Yes Teague Goynes, Wonda Cheng, MD  nitroGLYCERIN (NITROSTAT) 0.4 MG SL tablet Place 1 tablet (0.4 mg total) under the tongue every 5 (five) minutes as needed for chest pain. Patient not taking: Reported on 09/20/2022 08/31/20   Lasharon Dunivan, Wonda Cheng, MD    Inpatient Medications: Scheduled Meds:  carvedilol  6.25 mg Oral BID   clopidogrel  75 mg Oral Daily   empagliflozin  10 mg Oral Daily   [START ON 09/23/2022] fluticasone furoate-vilanterol  1 puff Inhalation Daily   furosemide  20 mg Intravenous BID   heparin  5,000 Units Subcutaneous Q8H   insulin aspart  0-5 Units Subcutaneous QHS   insulin aspart  0-9 Units Subcutaneous TID WC   pantoprazole  40 mg Oral Daily   [START ON 09/23/2022] umeclidinium bromide  1 puff Inhalation Daily   Continuous Infusions:  PRN Meds: acetaminophen **OR** acetaminophen, albuterol, ondansetron **OR** ondansetron (ZOFRAN) IV  Allergies:    Allergies  Allergen Reactions   Aldactone [Spironolactone] Other (See Comments)    Swollen breast   Lipitor [Atorvastatin Calcium] Other (See Comments)    Muscle ache   Sulfa Drugs Cross Reactors Other (See Comments)    "raw spots"   Sulfamethoxazole-Trimethoprim     Other  reaction(s): Rash   Codeine Rash    Social History:   Social History   Socioeconomic History   Marital status: Married    Spouse name: Not on file   Number of children: 2   Years of education: Not on file   Highest education level: Not on file  Occupational History   Not on file  Tobacco Use   Smoking status: Former    Packs/day: 1.00    Years: 20.00    Total pack years: 20.00    Types: Cigarettes    Quit date: 10/27/2001    Years since quitting: 20.9    Passive exposure: Never   Smokeless tobacco: Never  Vaping Use   Vaping Use: Never used  Substance and Sexual Activity   Alcohol use: No    Alcohol/week: 0.0 standard drinks of alcohol   Drug use: No   Sexual activity: Not Currently  Other Topics Concern   Not on file  Social History Narrative   Not on file   Social Determinants of Health   Financial Resource Strain: Not on file  Food Insecurity: No Food Insecurity (09/21/2022)   Hunger Vital Sign    Worried About Running Out of Food in the Last Year: Never true    Ran Out of Food in the Last Year: Never true  Transportation Needs: No Transportation Needs (09/21/2022)   PRAPARE - Hydrologist (Medical): No    Lack of Transportation (Non-Medical): No  Physical Activity: Not on file  Stress: Not on file  Social Connections: Not on file  Intimate Partner Violence: Not At Risk (09/21/2022)   Humiliation, Afraid, Rape, and Kick questionnaire    Fear of Current or Ex-Partner: No    Emotionally Abused: No    Physically Abused: No    Sexually Abused: No    Family History:    Family History  Problem Relation Age of Onset  Heart attack Mother    Diabetes Mother    Heart disease Mother    Hypertension Mother    Heart attack Father    Heart disease Father    Hypertension Father    Diabetes Brother    Diabetes Sister    Diabetes Daughter    Heart disease Daughter    Hypertension Daughter    Diabetes Sister    Breast cancer Neg  Hx    Prostate cancer Neg Hx    Colon cancer Neg Hx    Pancreatic cancer Neg Hx      ROS:  Please see the history of present illness.  General:no colds or fevers, no weight changes Skin:no rashes or ulcers HEENT:no blurred vision, no congestion CV:see HPI PUL:see HPI GI:no diarrhea constipation or melena, no indigestion GU:no hematuria, no dysuria MS:no joint pain, no claudication Neuro:no syncope, no lightheadedness Endo:no diabetes, no thyroid disease  All other ROS reviewed and negative.     Physical Exam/Data:   Vitals:   09/22/22 0418 09/22/22 0722 09/22/22 0826 09/22/22 1042  BP: 114/80 (!) 127/59  (!) 115/58  Pulse: 70 66  68  Resp: '20 18  20  '$ Temp: 98 F (36.7 C) 98 F (36.7 C)  98.4 F (36.9 C)  TempSrc: Oral Oral  Oral  SpO2: 95% 92% 92% 92%  Weight: 83.1 kg     Height:        Intake/Output Summary (Last 24 hours) at 09/22/2022 1431 Last data filed at 09/22/2022 1120 Gross per 24 hour  Intake 720 ml  Output 2225 ml  Net -1505 ml      09/22/2022    4:18 AM 09/21/2022    4:16 AM 09/20/2022    8:24 PM  Last 3 Weights  Weight (lbs) 183 lb 1.6 oz 183 lb 11.2 oz 183 lb 13.8 oz  Weight (kg) 83.054 kg 83.326 kg 83.4 kg     Body mass index is 25.54 kg/m.  General:  Well nourished, well developed, in no acute distress but still not baseline HEENT: normal Neck: no JVD sitting up in chair Vascular: + carotid bruits; Distal pulses 2+ bilaterally Cardiac:  normal S1, S2; RRR; soft systolic murmur no gallup rub or click Lungs:  diminished in base and tight with some rales to auscultation bilaterally, no wheezing, rhonchi  Abd: soft, nontender, no hepatomegaly  Ext: 1-2+ edema bil to below knee Musculoskeletal:  No deformities, BUE and BLE strength normal and equal Skin: warm and dry  Neuro: alert and oriented X 3 MAE follows commands, no focal abnormalities noted Psych:  Normal affect    Relevant CV Studies: TTE 09/22/22  IMPRESSIONS     1. Left  ventricular ejection fraction, by estimation, is 30 to 35%. The  left ventricle has moderately decreased function. The left ventricle  demonstrates regional wall motion abnormalities (see scoring  diagram/findings for description). There is mild  concentric left ventricular hypertrophy. Left ventricular diastolic  parameters are consistent with Grade II diastolic dysfunction  (pseudonormalization). There is the interventricular septum is flattened  in systole and diastole, consistent with right  ventricular pressure and volume overload.   2. Right ventricular systolic function is mildly reduced. The right  ventricular size is normal. There is mildly elevated pulmonary artery  systolic pressure. The estimated right ventricular systolic pressure is  08.6 mmHg.   3. Moderate pleural effusion in the left lateral region.   4. The mitral valve is grossly normal. Mild mitral valve regurgitation.   5.  Tricuspid valve regurgitation is mild to moderate.   6. The aortic valve is tricuspid. There is moderate calcification of the  aortic valve. Aortic valve regurgitation is mild. Aortic regurgitation PHT  measures 709 msec. Aortic valve mean gradient measures 3.5 mmHg.   7. The inferior vena cava is normal in size with <50% respiratory  variability, suggesting right atrial pressure of 8 mmHg.   Comparison(s): Prior images reviewed side by side. LVEF has decreased in  comparison with progressive wall motion abnormalities.   FINDINGS   Left Ventricle: Left ventricular ejection fraction, by estimation, is 30  to 35%. The left ventricle has moderately decreased function. The left  ventricle demonstrates regional wall motion abnormalities. Definity  contrast agent was given IV to delineate  the left ventricular endocardial borders. The left ventricular internal  cavity size was normal in size. There is mild concentric left ventricular  hypertrophy. The interventricular septum is flattened in systole  and  diastole, consistent with right  ventricular pressure and volume overload. Left ventricular diastolic  parameters are consistent with Grade II diastolic dysfunction  (pseudonormalization).     LV Wall Scoring:  The inferior wall, posterior wall, mid inferoseptal segment, basal  inferoseptal segment, and apex are akinetic. The entire anterior septum  and  apical inferior segment are hypokinetic. The entire anterior wall,  antero-lateral wall, and apical lateral segment are normal.   Right Ventricle: The right ventricular size is normal. No increase in  right ventricular wall thickness. Right ventricular systolic function is  mildly reduced. There is mildly elevated pulmonary artery systolic  pressure. The tricuspid regurgitant velocity   is 2.93 m/s, and with an assumed right atrial pressure of 8 mmHg, the  estimated right ventricular systolic pressure is 46.6 mmHg.   Laboratory Data:  High Sensitivity Troponin:   Recent Labs  Lab 09/20/22 1129 09/20/22 1550  TROPONINIHS 79* 61*     Chemistry Recent Labs  Lab 09/20/22 1129 09/20/22 2235 09/21/22 0008 09/22/22 0044  NA 143 143 142 140  K 2.7* 3.1* 2.9* 3.7  CL 104 109 110 108  CO2 '25 22 22 '$ 19*  GLUCOSE 108* 199* 189* 173*  BUN '21 18 18 22  '$ CREATININE 1.47* 1.59* 1.57* 1.77*  CALCIUM 8.8* 8.5* 8.3* 8.9  MG 2.5* 2.4  --   --   GFRNONAA 49* 45* 46* 40*  ANIONGAP '14 12 10 13    '$ Recent Labs  Lab 09/21/22 0008  PROT 5.9*  ALBUMIN 2.7*  AST 16  ALT 12  ALKPHOS 53  BILITOT 0.5   Lipids No results for input(s): "CHOL", "TRIG", "HDL", "LABVLDL", "LDLCALC", "CHOLHDL" in the last 168 hours.  Hematology Recent Labs  Lab 09/20/22 2235 09/21/22 0008 09/21/22 1155 09/22/22 0044  WBC 4.8 5.0  --  6.9  RBC 3.64* 3.44* 3.49* 3.66*  HGB 9.9* 9.2*  --  9.8*  HCT 32.0* 30.4*  --  31.9*  MCV 87.9 88.4  --  87.2  MCH 27.2 26.7  --  26.8  MCHC 30.9 30.3  --  30.7  RDW 16.2* 16.3*  --  16.0*  PLT 198 196  --  220    Thyroid No results for input(s): "TSH", "FREET4" in the last 168 hours.  BNP Recent Labs  Lab 09/20/22 1129  BNP 1,408.9*    DDimer  Recent Labs  Lab 09/20/22 2235  DDIMER 1.09*     Radiology/Studies:  ECHOCARDIOGRAM COMPLETE  Result Date: 09/21/2022    ECHOCARDIOGRAM REPORT   Patient Name:  [name] Date of Exam: 09/21/2022 Medical Rec #:  [id number]     Height:       71.0 in Accession #:    [id number]    Weight:       183.7 lb Date of Birth:  September 20, 1946     BSA:          2.034 m Patient Age:    16 years      BP:           132/62 mmHg Patient Gender: M             HR:           67 bpm. Exam Location:  Inpatient Procedure: 2D Echo and Intracardiac Opacification Agent Indications:    CHF  History:        Patient has prior history of Echocardiogram examinations, most                 recent 07/03/2021. CAD; Risk Factors:Hypertension, Diabetes and                 Dyslipidemia.  Sonographer:    Harvie Junior Referring Phys: [place]  Sonographer Comments: Technically difficult study due to poor echo windows. IMPRESSIONS  1. Left ventricular ejection fraction, by estimation, is 30 to 35%. The left ventricle has moderately decreased function. The left ventricle demonstrates regional wall motion abnormalities (see scoring diagram/findings for description). There is mild concentric left ventricular hypertrophy. Left ventricular diastolic parameters are consistent with Grade II diastolic dysfunction (pseudonormalization). There is the interventricular septum is flattened in systole and diastole, consistent with right ventricular pressure and volume overload.  2. Right ventricular systolic function is mildly reduced. The right ventricular size is normal. There is mildly elevated pulmonary artery systolic pressure. The estimated right ventricular systolic pressure is 81.2 mmHg.  3. Moderate pleural effusion in the left lateral region.  4. The mitral valve is grossly normal. Mild mitral valve  regurgitation.  5. Tricuspid valve regurgitation is mild to moderate.  6. The aortic valve is tricuspid. There is moderate calcification of the aortic valve. Aortic valve regurgitation is mild. Aortic regurgitation PHT measures 709 msec. Aortic valve mean gradient measures 3.5 mmHg.  7. The inferior vena cava is normal in size with <50% respiratory variability, suggesting right atrial pressure of 8 mmHg. Comparison(s): Prior images reviewed side by side. LVEF has decreased in comparison with progressive wall motion abnormalities. FINDINGS  Left Ventricle: Left ventricular ejection fraction, by estimation, is 30 to 35%. The left ventricle has moderately decreased function. The left ventricle demonstrates regional wall motion abnormalities. Definity contrast agent was given IV to delineate the left ventricular endocardial borders. The left ventricular internal cavity size was normal in size. There is mild concentric left ventricular hypertrophy. The interventricular septum is flattened in systole and diastole, consistent with right ventricular pressure and volume overload. Left ventricular diastolic parameters are consistent with Grade II diastolic dysfunction (pseudonormalization).  LV Wall Scoring: The inferior wall, posterior wall, mid inferoseptal segment, basal inferoseptal segment, and apex are akinetic. The entire anterior septum and apical inferior segment are hypokinetic. The entire anterior wall, antero-lateral wall, and apical lateral segment are normal. Right Ventricle: The right ventricular size is normal. No increase in right ventricular wall thickness. Right ventricular systolic function is mildly reduced. There is mildly elevated pulmonary artery systolic pressure. The tricuspid regurgitant velocity  is 2.93 m/s, and with an assumed right atrial pressure of 8 mmHg, the estimated right ventricular  systolic pressure is 34.7 mmHg. Left Atrium: Left atrial size was normal in size. Right Atrium: Right  atrial size was normal in size. Pericardium: There is no evidence of pericardial effusion. Mitral Valve: The mitral valve is grossly normal. Mild mitral annular calcification. Mild mitral valve regurgitation. Tricuspid Valve: The tricuspid valve is grossly normal. Tricuspid valve regurgitation is mild to moderate. Aortic Valve: The aortic valve is tricuspid. There is moderate calcification of the aortic valve. There is mild aortic valve annular calcification. Aortic valve regurgitation is mild. Aortic regurgitation PHT measures 709 msec. Aortic valve mean gradient  measures 3.5 mmHg. Aortic valve peak gradient measures 8.4 mmHg. Aortic valve area, by VTI measures 1.95 cm. Pulmonic Valve: The pulmonic valve was grossly normal. Pulmonic valve regurgitation is trivial. Aorta: The aortic root is normal in size and structure. Venous: The inferior vena cava is normal in size with less than 50% respiratory variability, suggesting right atrial pressure of 8 mmHg. IAS/Shunts: No atrial level shunt detected by color flow Doppler. Additional Comments: There is a moderate pleural effusion in the left lateral region.  LEFT VENTRICLE PLAX 2D LVIDd:         5.30 cm      Diastology LVIDs:         4.30 cm      LV e' medial:    4.46 cm/s LV PW:         1.10 cm      LV E/e' medial:  30.9 LV IVS:        1.20 cm      LV e' lateral:   8.92 cm/s LVOT diam:     1.80 cm      LV E/e' lateral: 15.5 LV SV:         56 LV SV Index:   27 LVOT Area:     2.54 cm  LV Volumes (MOD) LV vol d, MOD A2C: 196.0 ml LV vol d, MOD A4C: 241.0 ml LV vol s, MOD A2C: 171.0 ml LV vol s, MOD A4C: 152.0 ml LV SV MOD A2C:     25.0 ml LV SV MOD A4C:     241.0 ml LV SV MOD BP:      64.2 ml RIGHT VENTRICLE RV Basal diam:  3.80 cm RV Mid diam:    3.40 cm RV S prime:     5.44 cm/s TAPSE (M-mode): 1.3 cm LEFT ATRIUM             Index        RIGHT ATRIUM           Index LA diam:        4.20 cm 2.07 cm/m   RA Area:     15.20 cm LA Vol (A2C):   54.3 ml 26.70 ml/m  RA  Volume:   40.30 ml  19.82 ml/m LA Vol (A4C):   59.9 ml 29.45 ml/m LA Biplane Vol: 59.4 ml 29.21 ml/m  AORTIC VALVE                    PULMONIC VALVE AV Area (Vmax):    1.76 cm     PV Vmax:       1.26 m/s AV Area (Vmean):   1.86 cm     PV Peak grad:  6.4 mmHg AV Area (VTI):     1.95 cm AV Vmax:           144.50 cm/s AV Vmean:  83.750 cm/s AV VTI:            0.286 m AV Peak Grad:      8.4 mmHg AV Mean Grad:      3.5 mmHg LVOT Vmax:         99.80 cm/s LVOT Vmean:        61.200 cm/s LVOT VTI:          0.219 m LVOT/AV VTI ratio: 0.77 AI PHT:            709 msec  AORTA Ao Root diam: 3.00 cm MITRAL VALVE                TRICUSPID VALVE MV Area (PHT): 4.39 cm     TR Peak grad:   34.3 mmHg MV Decel Time: 173 msec     TR Vmax:        293.00 cm/s MR Peak grad: 26.1 mmHg MR Vmax:      255.33 cm/s   SHUNTS MV E velocity: 138.00 cm/s  Systemic VTI:  0.22 m MV A velocity: 55.60 cm/s   Systemic Diam: 1.80 cm MV E/A ratio:  2.48 Rozann Lesches MD Electronically signed by Rozann Lesches MD Signature Date/Time: 09/21/2022/3:26:12 PM    Final    DG Chest 2 View  Result Date: 09/20/2022 CLINICAL DATA:  Shortness of breath.  CHF. EXAM: CHEST - 2 VIEW COMPARISON:  Chest x-ray November 30, 2021 FINDINGS: Stable cardiomegaly. Sternotomy wires are intact. The hila and mediastinum are unremarkable. No pneumothorax. No nodules or masses. Bilateral pulmonary opacities appear to be primarily interstitial bilateral pleural effusions. IMPRESSION: Findings are most consistent with cardiomegaly, small effusions, and mild pulmonary edema. Electronically Signed   By: Dorise Bullion III M.D.   On: 09/20/2022 12:54     Assessment and Plan:   Acute on chronic/combined CHF- now HFrEF with hypoxia with EF 30-35%. RV dysfunction On lasix 20 mg IV BID now (reduced with bump in Cr from 1.59 to 1.77) and The left ventricle demonstrates regional wall motion abnormalities (see scoring diagram/findings for description). Still volume  overload defer to Dr. Acie Fredrickson-- needs low salt diet.  2.  Anemia with Iron 29 and iron sat 9 Hgb 9.8, WBC 6.9 plts 220  IV iron well be given this may be playing some role  3.  CAD with s/p CABG on plavix and BB. No chest pain and troponins 79 and 61    4.  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  Cr in past 1.30 last normal was 1.04 10/2021)  5.  PAD bil CEA and Rt SFA stent  followed by Dr Donzetta Matters  last dopplers 4.27.23   6.  HTN on amlodipine held with lower BP along with benazepril.   7.  DM/PAD per IM  8. COPD per IM  9.  AI may need RHC    Risk Assessment/Risk Scores:        New York Heart Association (NYHA) Functional Class NYHA Class IV        For questions or updates, please contact [place] Please consult [email or web address] for contact info under    Signed, Cecilie Kicks, NP  09/22/2022 2:31 PM  Attending Note:   The patient was seen and examined.  Agree with assessment and plan as noted above.  Changes made to the above note as needed.  Patient seen and independently examined with Cecilie Kicks, NP .   We discussed all aspects of the encounter. I agree with the assessment  and plan as stated above.     Acute on chronic combined CHF:    His EF has worsened recently  Admits to salt indiscretion recently   Has diuresed 3.5 liters so far but still has basilar rales and leg edema and his creatinine has started to increase   Will consider placing a PICC line and milrinone Will run by the advanced heart failure team   Could also consider a RHC .    Cont lasix for now   He's not having any angina symptoms .   Worsening CAD is a consideration but at present he is not able to lie flat  Will tune up his volume overload and consider ischemic work up if he does not improve significantly .    I have spent a total of 40 minutes with patient reviewing hospital  notes , telemetry, EKGs, labs and examining patient as well as establishing an assessment and plan that was  discussed with the patient.  > 50% of time was spent in direct patient care.    Thayer Headings, Brooke Bonito., MD, [facility] 09/22/2022, 4:28 PM [address]  [place] Pager [phone]

## 2022-09-22 NOTE — TOC Benefit Eligibility Note (Signed)
Patient Teacher, English as a foreign language completed.    The patient is currently admitted and upon discharge could be taking [place].  The current 30 day co-pay is $47.00.     Patient Teacher, English as a foreign language completed.    The patient is currently admitted and upon discharge could be taking Jardiance.  The current 30 day co-pay is $47.00.   The patient is insured through [employer]

## 2022-09-22 NOTE — Progress Notes (Signed)
Heart Failure Navigator Progress Note  Following this hospitalization to assess for HV TOC readiness.   Ef 30-35% G2DD ? Ischemic evaluation  Earnestine Leys, BSN, RN Heart Failure Leisure centre manager Chat Only

## 2022-09-22 NOTE — Progress Notes (Signed)
   09/22/22 1000  Mobility  Activity Ambulated with assistance in hallway  Level of Assistance Contact guard assist, steadying assist  Assistive Device None  Distance Ambulated (ft) 230 ft  Activity Response Tolerated well  Mobility Referral Yes  $Mobility charge 1 Mobility   Mobility Specialist Progress Note  Pre-Mobility: 72 HR, 89% SpO2(6L) During Mobility: 101HR, 87%-92% SpO2(8L) Post-Mobility: 95 HR,93% SpO2  Pt was EOB and agreeable. X1 standing break d/t SOB but was coached through pursed lip breathing. Returned to EOB w/ all needs met and call bell in reach.  Lucious Groves Mobility Specialist

## 2022-09-22 NOTE — Progress Notes (Signed)
PROGRESS NOTE    [name]  HYI:[id number] DOB: 10-Apr-1946 DOA: 09/20/2022 PCP: Haywood Pao, MD  [name] is a 76 y.o. male with medical history significant of  h/o CAD s/p CABG, chronic diastolic CHF, COPD, PAD s/p vascular intervention right leg 2015,, DM, HTN, HLD,  prostate CA presenting with SOB and edema, O2 sat 87% on RA.  New O2 requirement, 4L.  Compliant with Lasix.    BNP 1400, CXR with edema.   Subjective: -Feels better, breathing starting to improve  Assessment and Plan:  Acute hypoxic resp failure Acute systolic and diastolic heart failure  Hypoalbumonemia -last ECHo 9/22 w/ normal EF, grade 2 DD, normal RV, mod AI -Repeat echo with EF down to 30-35% and regional wall motion abnormalities, will request cardiology input, anticipate need for ischemic eval  -Diuresed with IV Lasix he is 2.7 L negative, creatinine bumped to 1.7, will cut down Lasix dose , started on Jardiance -Coreg continued -Monitor/I's/O, daily weights   h/o CAD s/p CABG 2003 -continue plavix/BB -Echo with worsening cardiomyopathy and regional WMA  AKI on CKD3a -Baseline creatinine around 1.3-1.4, now 1.7, decrease diuretics -Hold benazepril   PAD  -s/p vascular intervention right leg 2015 -continue plavix    DM -CBG stable, continue sliding scale insulin, hold metformin -last A1c  7.5, CBGs are stable   Hypertension -Hold benazepril, continue amlodipine    Prostate CA  -continue to follow with urology    Normocytic anemia -Anemia panel with iron deficiency, will add IV iron  DVT prophylaxis: hep SQ Code Status: Full Code Family Communication: Discussed with patient daughter at bedside Disposition Plan: Home pending cardiac eval  Consultants:    Procedures:   Antimicrobials:    Objective: Vitals:   09/22/22 0418 09/22/22 0722 09/22/22 0826 09/22/22 1042  BP: 114/80 (!) 127/59  (!) 115/58  Pulse: 70 66  68  Resp: '20 18  20  '$ Temp: 98 F (36.7 C) 98 F  (36.7 C)  98.4 F (36.9 C)  TempSrc: Oral Oral  Oral  SpO2: 95% 92% 92% 92%  Weight: 83.1 kg     Height:        Intake/Output Summary (Last 24 hours) at 09/22/2022 1118 Last data filed at 09/22/2022 1045 Gross per 24 hour  Intake 770 ml  Output 2125 ml  Net -1355 ml   Filed Weights   09/20/22 2024 09/21/22 0416 09/22/22 0418  Weight: 83.4 kg 83.3 kg 83.1 kg    Examination:  General exam: Elderly male sitting up in bed, AAOx3, mild cognitive deficits HEENT: Positive JVD CVS: S1-S2, regular rhythm Lungs: Few basilar Rales Abdomen: Soft, nontender, bowel sounds present Extremities: 1+ edema  Skin: No rashes Psychiatry:  Mood & affect appropriate.     Data Reviewed:   CBC: Recent Labs  Lab 09/20/22 1129 09/20/22 2235 09/21/22 0008 09/22/22 0044  WBC 5.4 4.8 5.0 6.9  NEUTROABS 3.9  --   --   --   HGB 10.3* 9.9* 9.2* 9.8*  HCT 33.7* 32.0* 30.4* 31.9*  MCV 86.9 87.9 88.4 87.2  PLT 244 198 196 786   Basic Metabolic Panel: Recent Labs  Lab 09/20/22 1129 09/20/22 2235 09/21/22 0008 09/22/22 0044  NA 143 143 142 140  K 2.7* 3.1* 2.9* 3.7  CL 104 109 110 108  CO2 '25 22 22 '$ 19*  GLUCOSE 108* 199* 189* 173*  BUN '21 18 18 22  '$ CREATININE 1.47* 1.59* 1.57* 1.77*  CALCIUM 8.8* 8.5* 8.3* 8.9  MG 2.5* 2.4  --   --   PHOS  --  2.4*  --   --    GFR: Estimated Creatinine Clearance: 38.4 mL/min (A) (by C-G formula based on SCr of 1.77 mg/dL (H)). Liver Function Tests: Recent Labs  Lab 09/21/22 0008  AST 16  ALT 12  ALKPHOS 53  BILITOT 0.5  PROT 5.9*  ALBUMIN 2.7*   No results for input(s): "LIPASE", "AMYLASE" in the last 168 hours. No results for input(s): "AMMONIA" in the last 168 hours. Coagulation Profile: No results for input(s): "INR", "PROTIME" in the last 168 hours. Cardiac Enzymes: No results for input(s): "CKTOTAL", "CKMB", "CKMBINDEX", "TROPONINI" in the last 168 hours. BNP (last 3 results) No results for input(s): "PROBNP" in the last 8760  hours. HbA1C: Recent Labs    09/20/22 1550  HGBA1C 7.0*   CBG: Recent Labs  Lab 09/21/22 1109 09/21/22 1609 09/21/22 2050 09/22/22 0628 09/22/22 1056  GLUCAP 180* 179* 190* 164* 195*   Lipid Profile: No results for input(s): "CHOL", "HDL", "LDLCALC", "TRIG", "CHOLHDL", "LDLDIRECT" in the last 72 hours. Thyroid Function Tests: No results for input(s): "TSH", "T4TOTAL", "FREET4", "T3FREE", "THYROIDAB" in the last 72 hours. Anemia Panel: Recent Labs    09/21/22 1155  VITAMINB12 104*  FOLATE 13.1  FERRITIN 26  TIBC 314  IRON 29*  RETICCTPCT 2.6   Urine analysis:    Component Value Date/Time   COLORURINE YELLOW 07/19/2013 [place] 07/19/2013 1223   LABSPEC 1.013 07/19/2013 1223   PHURINE 5.5 07/19/2013 1223   GLUCOSEU 500 (A) 07/19/2013 1223   HGBUR NEGATIVE 07/19/2013 1223   BILIRUBINUR NEGATIVE 07/19/2013 1223   KETONESUR NEGATIVE 07/19/2013 1223   PROTEINUR NEGATIVE 07/19/2013 1223   UROBILINOGEN 1.0 07/19/2013 1223   NITRITE NEGATIVE 07/19/2013 1223   LEUKOCYTESUR NEGATIVE 07/19/2013 1223   Sepsis Labs: '@LABRCNTIP'$ (procalcitonin:4,lacticidven:4)  ) Recent Results (from the past 240 hour(s))  Resp Panel by RT-PCR (Flu A&B, Covid) Anterior Nasal Swab     Status: None   Collection Time: 09/20/22  1:54 PM   Specimen: Anterior Nasal Swab  Result Value Ref Range Status   SARS Coronavirus 2 by RT PCR NEGATIVE NEGATIVE Final    Comment: (NOTE) SARS-CoV-2 target nucleic acids are NOT DETECTED.  The SARS-CoV-2 RNA is generally detectable in upper respiratory specimens during the acute phase of infection. The lowest concentration of SARS-CoV-2 viral copies this assay can detect is 138 copies/mL. A negative result does not preclude SARS-Cov-2 infection and should not be used as the sole basis for treatment or other patient management decisions. A negative result may occur with  improper specimen collection/handling, submission of specimen  other than nasopharyngeal swab, presence of viral mutation(s) within the areas targeted by this assay, and inadequate number of viral copies(<138 copies/mL). A negative result must be combined with clinical observations, patient history, and epidemiological information. The expected result is Negative.  Fact Sheet for Patients:  EntrepreneurPulse.com.au  Fact Sheet for Healthcare Providers:  IncredibleEmployment.be  This test is no t yet approved or cleared by the [place] FDA and  has been authorized for detection and/or diagnosis of SARS-CoV-2 by FDA under an Emergency Use Authorization (EUA). This EUA will remain  in effect (meaning this test can be used) for the duration of the COVID-19 declaration under Section 564(b)(1) of the Act, 21 U.S.C.section 360bbb-3(b)(1), unless the authorization is terminated  or revoked sooner.       Influenza A by PCR NEGATIVE NEGATIVE Final  Influenza B by PCR NEGATIVE NEGATIVE Final    Comment: (NOTE) The Xpert Xpress SARS-CoV-2/FLU/RSV plus assay is intended as an aid in the diagnosis of influenza from Nasopharyngeal swab specimens and should not be used as a sole basis for treatment. Nasal washings and aspirates are unacceptable for Xpert Xpress SARS-CoV-2/FLU/RSV testing.  Fact Sheet for Patients: EntrepreneurPulse.com.au  Fact Sheet for Healthcare Providers: IncredibleEmployment.be  This test is not yet approved or cleared by the [place] FDA and has been authorized for detection and/or diagnosis of SARS-CoV-2 by FDA under an Emergency Use Authorization (EUA). This EUA will remain in effect (meaning this test can be used) for the duration of the COVID-19 declaration under Section 564(b)(1) of the Act, 21 U.S.C. section 360bbb-3(b)(1), unless the authorization is terminated or revoked.  Performed at [facility], [address] [place], [place] [address]   Respiratory (~20 pathogens) panel by PCR     Status: None   Collection Time: 09/21/22  8:35 AM   Specimen: Nasopharyngeal Swab; Respiratory  Result Value Ref Range Status   Adenovirus NOT DETECTED NOT DETECTED Final   Coronavirus 229E NOT DETECTED NOT DETECTED Final    Comment: (NOTE) The Coronavirus on the Respiratory Panel, DOES NOT test for the novel  Coronavirus (2019 nCoV)    Coronavirus HKU1 NOT DETECTED NOT DETECTED Final   Coronavirus NL63 NOT DETECTED NOT DETECTED Final   Coronavirus OC43 NOT DETECTED NOT DETECTED Final   Metapneumovirus NOT DETECTED NOT DETECTED Final   Rhinovirus / Enterovirus NOT DETECTED NOT DETECTED Final   Influenza A NOT DETECTED NOT DETECTED Final   Influenza B NOT DETECTED NOT DETECTED Final   Parainfluenza Virus 1 NOT DETECTED NOT DETECTED Final   Parainfluenza Virus 2 NOT DETECTED NOT DETECTED Final   Parainfluenza Virus 3 NOT DETECTED NOT DETECTED Final   Parainfluenza Virus 4 NOT DETECTED NOT DETECTED Final   Respiratory Syncytial Virus NOT DETECTED NOT DETECTED Final   Bordetella pertussis NOT DETECTED NOT DETECTED Final   Bordetella Parapertussis NOT DETECTED NOT DETECTED Final   Chlamydophila pneumoniae NOT DETECTED NOT DETECTED Final   Mycoplasma pneumoniae NOT DETECTED NOT DETECTED Final    Comment: Performed at [facility], [address], [place], [place] [address]     Radiology Studies: ECHOCARDIOGRAM COMPLETE  Result Date: 09/21/2022    ECHOCARDIOGRAM REPORT   Patient Name:   DEVANTE CAPANO Date of Exam: 09/21/2022 Medical Rec #:  [id number]     Height:       71.0 in Accession #:    [id number]    Weight:       183.7 lb Date of Birth:  [birth year]     BSA:          2.034 m Patient Age:    34 years      BP:           132/62 mmHg Patient Gender: M             HR:           67 bpm. Exam Location:  Inpatient Procedure: 2D Echo and Intracardiac Opacification Agent Indications:    CHF  History:         Patient has prior history of Echocardiogram examinations, most                 recent 07/03/2021. CAD; Risk Factors:Hypertension, Diabetes and  Dyslipidemia.  Sonographer:    Harvie Junior Referring Phys: [place]  Sonographer Comments: Technically difficult study due to poor echo windows. IMPRESSIONS  1. Left ventricular ejection fraction, by estimation, is 30 to 35%. The left ventricle has moderately decreased function. The left ventricle demonstrates regional wall motion abnormalities (see scoring diagram/findings for description). There is mild concentric left ventricular hypertrophy. Left ventricular diastolic parameters are consistent with Grade II diastolic dysfunction (pseudonormalization). There is the interventricular septum is flattened in systole and diastole, consistent with right ventricular pressure and volume overload.  2. Right ventricular systolic function is mildly reduced. The right ventricular size is normal. There is mildly elevated pulmonary artery systolic pressure. The estimated right ventricular systolic pressure is 26.7 mmHg.  3. Moderate pleural effusion in the left lateral region.  4. The mitral valve is grossly normal. Mild mitral valve regurgitation.  5. Tricuspid valve regurgitation is mild to moderate.  6. The aortic valve is tricuspid. There is moderate calcification of the aortic valve. Aortic valve regurgitation is mild. Aortic regurgitation PHT measures 709 msec. Aortic valve mean gradient measures 3.5 mmHg.  7. The inferior vena cava is normal in size with <50% respiratory variability, suggesting right atrial pressure of 8 mmHg. Comparison(s): Prior images reviewed side by side. LVEF has decreased in comparison with progressive wall motion abnormalities. FINDINGS  Left Ventricle: Left ventricular ejection fraction, by estimation, is 30 to 35%. The left ventricle has moderately decreased function. The left ventricle demonstrates regional wall motion  abnormalities. Definity contrast agent was given IV to delineate the left ventricular endocardial borders. The left ventricular internal cavity size was normal in size. There is mild concentric left ventricular hypertrophy. The interventricular septum is flattened in systole and diastole, consistent with right ventricular pressure and volume overload. Left ventricular diastolic parameters are consistent with Grade II diastolic dysfunction (pseudonormalization).  LV Wall Scoring: The inferior wall, posterior wall, mid inferoseptal segment, basal inferoseptal segment, and apex are akinetic. The entire anterior septum and apical inferior segment are hypokinetic. The entire anterior wall, antero-lateral wall, and apical lateral segment are normal. Right Ventricle: The right ventricular size is normal. No increase in right ventricular wall thickness. Right ventricular systolic function is mildly reduced. There is mildly elevated pulmonary artery systolic pressure. The tricuspid regurgitant velocity  is 2.93 m/s, and with an assumed right atrial pressure of 8 mmHg, the estimated right ventricular systolic pressure is 12.4 mmHg. Left Atrium: Left atrial size was normal in size. Right Atrium: Right atrial size was normal in size. Pericardium: There is no evidence of pericardial effusion. Mitral Valve: The mitral valve is grossly normal. Mild mitral annular calcification. Mild mitral valve regurgitation. Tricuspid Valve: The tricuspid valve is grossly normal. Tricuspid valve regurgitation is mild to moderate. Aortic Valve: The aortic valve is tricuspid. There is moderate calcification of the aortic valve. There is mild aortic valve annular calcification. Aortic valve regurgitation is mild. Aortic regurgitation PHT measures 709 msec. Aortic valve mean gradient  measures 3.5 mmHg. Aortic valve peak gradient measures 8.4 mmHg. Aortic valve area, by VTI measures 1.95 cm. Pulmonic Valve: The pulmonic valve was grossly normal.  Pulmonic valve regurgitation is trivial. Aorta: The aortic root is normal in size and structure. Venous: The inferior vena cava is normal in size with less than 50% respiratory variability, suggesting right atrial pressure of 8 mmHg. IAS/Shunts: No atrial level shunt detected by color flow Doppler. Additional Comments: There is a moderate pleural effusion in the left lateral region.  LEFT  VENTRICLE PLAX 2D LVIDd:         5.30 cm      Diastology LVIDs:         4.30 cm      LV e' medial:    4.46 cm/s LV PW:         1.10 cm      LV E/e' medial:  30.9 LV IVS:        1.20 cm      LV e' lateral:   8.92 cm/s LVOT diam:     1.80 cm      LV E/e' lateral: 15.5 LV SV:         56 LV SV Index:   27 LVOT Area:     2.54 cm  LV Volumes (MOD) LV vol d, MOD A2C: 196.0 ml LV vol d, MOD A4C: 241.0 ml LV vol s, MOD A2C: 171.0 ml LV vol s, MOD A4C: 152.0 ml LV SV MOD A2C:     25.0 ml LV SV MOD A4C:     241.0 ml LV SV MOD BP:      64.2 ml RIGHT VENTRICLE RV Basal diam:  3.80 cm RV Mid diam:    3.40 cm RV S prime:     5.44 cm/s TAPSE (M-mode): 1.3 cm LEFT ATRIUM             Index        RIGHT ATRIUM           Index LA diam:        4.20 cm 2.07 cm/m   RA Area:     15.20 cm LA Vol (A2C):   54.3 ml 26.70 ml/m  RA Volume:   40.30 ml  19.82 ml/m LA Vol (A4C):   59.9 ml 29.45 ml/m LA Biplane Vol: 59.4 ml 29.21 ml/m  AORTIC VALVE                    PULMONIC VALVE AV Area (Vmax):    1.76 cm     PV Vmax:       1.26 m/s AV Area (Vmean):   1.86 cm     PV Peak grad:  6.4 mmHg AV Area (VTI):     1.95 cm AV Vmax:           144.50 cm/s AV Vmean:          83.750 cm/s AV VTI:            0.286 m AV Peak Grad:      8.4 mmHg AV Mean Grad:      3.5 mmHg LVOT Vmax:         99.80 cm/s LVOT Vmean:        61.200 cm/s LVOT VTI:          0.219 m LVOT/AV VTI ratio: 0.77 AI PHT:            709 msec  AORTA Ao Root diam: 3.00 cm MITRAL VALVE                TRICUSPID VALVE MV Area (PHT): 4.39 cm     TR Peak grad:   34.3 mmHg MV Decel Time: 173 msec     TR  Vmax:        293.00 cm/s MR Peak grad: 26.1 mmHg MR Vmax:      255.33 cm/s   SHUNTS MV E velocity: 138.00 cm/s  Systemic VTI:  0.22 m MV A velocity: 55.60  cm/s   Systemic Diam: 1.80 cm MV E/A ratio:  2.48 Rozann Lesches MD Electronically signed by Rozann Lesches MD Signature Date/Time: 09/21/2022/3:26:12 PM    Final    DG Chest 2 View  Result Date: 09/20/2022 CLINICAL DATA:  Shortness of breath.  CHF. EXAM: CHEST - 2 VIEW COMPARISON:  Chest x-ray November 30, 2021 FINDINGS: Stable cardiomegaly. Sternotomy wires are intact. The hila and mediastinum are unremarkable. No pneumothorax. No nodules or masses. Bilateral pulmonary opacities appear to be primarily interstitial bilateral pleural effusions. IMPRESSION: Findings are most consistent with cardiomegaly, small effusions, and mild pulmonary edema. Electronically Signed   By: Dorise Bullion III M.D.   On: 09/20/2022 12:54     Scheduled Meds:  albuterol  1 puff Inhalation UD   carvedilol  6.25 mg Oral BID   clopidogrel  75 mg Oral Daily   empagliflozin  10 mg Oral Daily   fluticasone furoate-vilanterol  1 puff Inhalation Daily   furosemide  20 mg Intravenous BID   heparin  5,000 Units Subcutaneous Q8H   insulin aspart  0-5 Units Subcutaneous QHS   insulin aspart  0-9 Units Subcutaneous TID WC   pantoprazole  40 mg Oral Daily   umeclidinium bromide  1 puff Inhalation Daily   Continuous Infusions:   LOS: 2 days    Time spent: 48mn    PDomenic Polite MD [facility]   09/22/2022, 11:18 AM

## 2022-09-23 ENCOUNTER — Inpatient Hospital Stay (HOSPITAL_COMMUNITY): Payer: Medicare HMO

## 2022-09-23 ENCOUNTER — Encounter (HOSPITAL_COMMUNITY)
Admission: EM | Disposition: A | Discharge status: Home / Self Care | Payer: Self-pay | Source: Home / Self Care | Attending: Internal Medicine

## 2022-09-23 DIAGNOSIS — I5033 Acute on chronic diastolic (congestive) heart failure: Secondary | ICD-10-CM | POA: Diagnosis not present

## 2022-09-23 HISTORY — PX: RIGHT HEART CATH: CATH118263

## 2022-09-23 LAB — CBC
HCT: 31.7 % — ABNORMAL LOW (ref 39.0–52.0)
Hemoglobin: 9.9 g/dL — ABNORMAL LOW (ref 13.0–17.0)
MCH: 26.8 pg (ref 26.0–34.0)
MCHC: 31.2 g/dL (ref 30.0–36.0)
MCV: 85.9 fL (ref 80.0–100.0)
Platelets: 210 10*3/uL (ref 150–400)
RBC: 3.69 MIL/uL — ABNORMAL LOW (ref 4.22–5.81)
RDW: 15.9 % — ABNORMAL HIGH (ref 11.5–15.5)
WBC: 5.6 10*3/uL (ref 4.0–10.5)
nRBC: 0 % (ref 0.0–0.2)

## 2022-09-23 LAB — BASIC METABOLIC PANEL
Anion gap: 13 (ref 5–15)
BUN: 26 mg/dL — ABNORMAL HIGH (ref 8–23)
CO2: 26 mmol/L (ref 22–32)
Calcium: 9.5 mg/dL (ref 8.9–10.3)
Chloride: 96 mmol/L — ABNORMAL LOW (ref 98–111)
Creatinine, Ser: 1.98 mg/dL — ABNORMAL HIGH (ref 0.61–1.24)
GFR, Estimated: 35 mL/min — ABNORMAL LOW (ref >60)
Glucose, Bld: 210 mg/dL — ABNORMAL HIGH (ref 70–99)
Potassium: 3.7 mmol/L (ref 3.5–5.1)
Sodium: 135 mmol/L (ref 135–145)

## 2022-09-23 LAB — GLUCOSE, CAPILLARY
Glucose-Capillary: 155 mg/dL — ABNORMAL HIGH (ref 70–99)
Glucose-Capillary: 263 mg/dL — ABNORMAL HIGH (ref 70–99)
Glucose-Capillary: 168 mg/dL — ABNORMAL HIGH (ref 70–99)
Glucose-Capillary: 235 mg/dL — ABNORMAL HIGH (ref 70–99)

## 2022-09-23 LAB — POCT I-STAT EG7
Acid-Base Excess: 2 mmol/L (ref 0.0–2.0)
Acid-Base Excess: 4 mmol/L — ABNORMAL HIGH (ref 0.0–2.0)
Bicarbonate: 26.8 mmol/L (ref 20.0–28.0)
Bicarbonate: 29.1 mmol/L — ABNORMAL HIGH (ref 20.0–28.0)
Calcium, Ion: 1.12 mmol/L — ABNORMAL LOW (ref 1.15–1.40)
Calcium, Ion: 1.17 mmol/L (ref 1.15–1.40)
HCT: 29 % — ABNORMAL LOW (ref 39.0–52.0)
HCT: 31 % — ABNORMAL LOW (ref 39.0–52.0)
Hemoglobin: 10.5 g/dL — ABNORMAL LOW (ref 13.0–17.0)
Hemoglobin: 9.9 g/dL — ABNORMAL LOW (ref 13.0–17.0)
O2 Saturation: 55 %
O2 Saturation: 58 %
Potassium: 3 mmol/L — ABNORMAL LOW (ref 3.5–5.1)
Potassium: 3.3 mmol/L — ABNORMAL LOW (ref 3.5–5.1)
Sodium: 138 mmol/L (ref 135–145)
Sodium: 140 mmol/L (ref 135–145)
TCO2: 28 mmol/L (ref 22–32)
TCO2: 30 mmol/L (ref 22–32)
pCO2, Ven: 41.1 mmHg — ABNORMAL LOW (ref 44–60)
pCO2, Ven: 45 mmHg (ref 44–60)
pH, Ven: 7.419 (ref 7.25–7.43)
pH, Ven: 7.422 (ref 7.25–7.43)
pO2, Ven: 29 mmHg — CL (ref 32–45)
pO2, Ven: 30 mmHg — CL (ref 32–45)

## 2022-09-23 LAB — COOXEMETRY PANEL
Carboxyhemoglobin: 1.9 % — ABNORMAL HIGH (ref 0.5–1.5)
Carboxyhemoglobin: 2.8 % — ABNORMAL HIGH (ref 0.5–1.5)
Methemoglobin: <0.7 % (ref 0.0–1.5)
Methemoglobin: <0.7 % (ref 0.0–1.5)
O2 Saturation: 46.1 %
O2 Saturation: 48 %
Total hemoglobin: 10.8 g/dL — ABNORMAL LOW (ref 12.0–16.0)
Total hemoglobin: 10.6 g/dL — ABNORMAL LOW (ref 12.0–16.0)

## 2022-09-23 LAB — CREATININE, SERUM
Creatinine, Ser: 2.7 mg/dL — ABNORMAL HIGH (ref 0.61–1.24)
GFR, Estimated: 24 mL/min — ABNORMAL LOW (ref >60)

## 2022-09-23 SURGERY — RIGHT HEART CATH
Anesthesia: LOCAL

## 2022-09-23 MED ORDER — SODIUM CHLORIDE 0.9% FLUSH
3.0000 mL | Freq: Two times a day (BID) | INTRAVENOUS | Status: DC
  Administered 2022-09-25: 3 mL via INTRAVENOUS

## 2022-09-23 MED ORDER — MIDAZOLAM HCL 2 MG/2ML IJ SOLN
INTRAMUSCULAR | Status: DC | PRN
  Administered 2022-09-23: 1 mg via INTRAVENOUS

## 2022-09-23 MED ORDER — LIDOCAINE HCL (PF) 1 % IJ SOLN
INTRAMUSCULAR | Status: DC | PRN
  Administered 2022-09-23: 5 mL

## 2022-09-23 MED ORDER — LABETALOL HCL 5 MG/ML IV SOLN: 10.0000 mg | INTRAVENOUS | Status: AC | PRN

## 2022-09-23 MED ORDER — SODIUM CHLORIDE 0.9% FLUSH
10.0000 mL | Freq: Two times a day (BID) | INTRAVENOUS | Status: DC
  Administered 2022-09-23 – 2022-09-25 (×5): 10 mL

## 2022-09-23 MED ORDER — HEPARIN (PORCINE) IN NACL 1000-0.9 UT/500ML-% IV SOLN
INTRAVENOUS | Status: AC
  Filled 2022-09-23: qty 500

## 2022-09-23 MED ORDER — MIDAZOLAM HCL 2 MG/2ML IJ SOLN
INTRAMUSCULAR | Status: AC
  Filled 2022-09-23: qty 2

## 2022-09-23 MED ORDER — SODIUM CHLORIDE 0.9% FLUSH: 3.0000 mL | INTRAVENOUS | Status: DC | PRN

## 2022-09-23 MED ORDER — HEPARIN (PORCINE) IN NACL 1000-0.9 UT/500ML-% IV SOLN
INTRAVENOUS | Status: DC | PRN
  Administered 2022-09-23: 500 mL

## 2022-09-23 MED ORDER — SODIUM CHLORIDE 0.9 % IV SOLN: INTRAVENOUS | Status: DC

## 2022-09-23 MED ORDER — FENTANYL CITRATE (PF) 100 MCG/2ML IJ SOLN
INTRAMUSCULAR | Status: DC | PRN
  Administered 2022-09-23: 25 ug via INTRAVENOUS

## 2022-09-23 MED ORDER — SODIUM CHLORIDE 0.9% FLUSH: 10.0000 mL | INTRAVENOUS | Status: DC | PRN

## 2022-09-23 MED ORDER — SODIUM CHLORIDE 0.9% FLUSH: 3.0000 mL | Freq: Two times a day (BID) | INTRAVENOUS | Status: DC

## 2022-09-23 MED ORDER — FUROSEMIDE 10 MG/ML IJ SOLN: 80.0000 mg | Freq: Once | INTRAMUSCULAR | Status: DC

## 2022-09-23 MED ORDER — SODIUM CHLORIDE 0.9 % IV SOLN: 250.0000 mL | INTRAVENOUS | Status: DC | PRN

## 2022-09-23 MED ORDER — ONDANSETRON HCL 4 MG/2ML IJ SOLN: 4.0000 mg | Freq: Four times a day (QID) | INTRAMUSCULAR | Status: DC | PRN

## 2022-09-23 MED ORDER — CHLORHEXIDINE GLUCONATE CLOTH 2 % EX PADS
6.0000 | MEDICATED_PAD | Freq: Every day | CUTANEOUS | Status: DC
  Administered 2022-09-23 – 2022-09-25 (×3): 6 via TOPICAL

## 2022-09-23 MED ORDER — FENTANYL CITRATE (PF) 100 MCG/2ML IJ SOLN
INTRAMUSCULAR | Status: AC
  Filled 2022-09-23: qty 2

## 2022-09-23 MED ORDER — ENOXAPARIN SODIUM 40 MG/0.4ML IJ SOSY
40.0000 mg | PREFILLED_SYRINGE | INTRAMUSCULAR | Status: DC
  Administered 2022-09-24 – 2022-09-25 (×2): 40 mg via SUBCUTANEOUS
  Filled 2022-09-23 (×2): qty 0.4

## 2022-09-23 MED ORDER — HYDRALAZINE HCL 20 MG/ML IJ SOLN: 10.0000 mg | INTRAMUSCULAR | Status: AC | PRN

## 2022-09-23 MED ORDER — ACETAMINOPHEN 325 MG PO TABS: 650.0000 mg | ORAL_TABLET | ORAL | Status: DC | PRN

## 2022-09-23 MED ORDER — LIDOCAINE HCL (PF) 1 % IJ SOLN
INTRAMUSCULAR | Status: AC
  Filled 2022-09-23: qty 30

## 2022-09-23 MED ORDER — INSULIN DETEMIR 100 UNIT/ML ~~LOC~~ SOLN
12.0000 [IU] | Freq: Every day | SUBCUTANEOUS | Status: DC
  Administered 2022-09-23: 12 [IU] via SUBCUTANEOUS
  Filled 2022-09-23 (×2): qty 0.12

## 2022-09-23 SURGICAL SUPPLY — 7 items
CATH SWAN GANZ 7F STRAIGHT (CATHETERS) IMPLANT
PACK CARDIAC CATHETERIZATION (CUSTOM PROCEDURE TRAY) ×2 IMPLANT
PROTECTION STATION PRESSURIZED (MISCELLANEOUS) ×1
SHEATH PINNACLE 7F 10CM (SHEATH) IMPLANT
SHEATH PROBE COVER 6X72 (BAG) IMPLANT
STATION PROTECTION PRESSURIZED (MISCELLANEOUS) IMPLANT
TRANSDUCER W/STOPCOCK (MISCELLANEOUS) ×2 IMPLANT

## 2022-09-23 NOTE — Progress Notes (Signed)
Provider at bedside, requesting for Co-ox to be sent down this morning. PICC placed.

## 2022-09-23 NOTE — Progress Notes (Signed)
Peripherally Inserted Central Catheter Placement  The IV Nurse has discussed with the patient and/or persons authorized to consent for the patient, the purpose of this procedure and the potential benefits and risks involved with this procedure.  The benefits include less needle sticks, lab draws from the catheter, and the patient may be discharged home with the catheter. Risks include, but not limited to, infection, bleeding, blood clot (thrombus formation), and puncture of an artery; nerve damage and irregular heartbeat and possibility to perform a PICC exchange if needed/ordered by physician.  Alternatives to this procedure were also discussed.  Bard Power PICC patient education guide, fact sheet on infection prevention and patient information card has been provided to patient /or left at bedside.    PICC Placement Documentation  PICC Double Lumen 26/41/58 Right Basilic 41 cm 0 cm (Active)  Indication for Insertion or Continuance of Line Vasoactive infusions 09/23/22 0902  Exposed Catheter (cm) 0 cm 09/23/22 0902  Site Assessment Clean, Dry, Intact 09/23/22 0902  Lumen #1 Status Flushed;Saline locked;Blood return noted 09/23/22 0902  Lumen #2 Status Flushed;Saline locked;Blood return noted 09/23/22 0902  Dressing Type Transparent;Securing device 09/23/22 0902  Dressing Status Antimicrobial disc in place 09/23/22 0902  Dressing Intervention New dressing;Other (Comment) 09/23/22 0902  Dressing Change Due 09/30/22 09/23/22 0902       [name] 09/23/2022, 9:18 AM

## 2022-09-23 NOTE — Plan of Care (Signed)
  Problem: Coping: Goal: Ability to adjust to condition or change in health will improve Outcome: Progressing   Problem: Education: Goal: Knowledge of General Education information will improve Description: Including pain rating scale, medication(s)/side effects and non-pharmacologic comfort measures Outcome: Progressing   Problem: Activity: Goal: Risk for activity intolerance will decrease Outcome: Progressing   Problem: Safety: Goal: Ability to remain free from injury will improve Outcome: Progressing

## 2022-09-23 NOTE — Progress Notes (Addendum)
Patient back from cath lab, no interventions. RIJ dressing- CDI. No hematoma palpable. Signed and held order for bedrest for one hour. VS obtained. Patient connected back to monitor for CVP monitoring.

## 2022-09-23 NOTE — Progress Notes (Signed)
   09/23/22 1000  Mobility  Activity Ambulated with assistance in hallway  Level of Assistance Contact guard assist, steadying assist  Assistive Device None  Distance Ambulated (ft) 290 ft  Activity Response Tolerated well  Mobility Referral Yes  $Mobility charge 1 Mobility   Mobility Specialist Progress Note  Pre-Mobility: 96% SpO2 During Mobility: 89-92% SpO2 Post-Mobility: 93% SpO2  Pt was in bed and agreeable. Had no c/o pain throughout ambulation. Returned to EOB w/ all needs met and call bell in reach.   Lucious Groves Mobility Specialist

## 2022-09-23 NOTE — Progress Notes (Signed)
Co-ox 46 with repeat at 48%. Reviewed with Dr. Haroldine Laws and decision made to take patient for RHC today.   CVP 4/5 while in room.   Patient agreeable, consent signed, all questions answered.   Earnie Larsson, AGACNP-BC  Advanced Heart Failure Team 09/23/22 1510

## 2022-09-23 NOTE — Interval H&P Note (Signed)
History and Physical Interval Note:  09/23/2022 4:27 PM  [name]  has presented today for surgery, with the diagnosis of Systolic heart failure.  The various methods of treatment have been discussed with the patient and family. After consideration of risks, benefits and other options for treatment, the patient has consented to  Procedure(s): RIGHT HEART CATH (N/A) as a surgical intervention.  The patient's history has been reviewed, patient examined, no change in status, stable for surgery.  I have reviewed the patient's chart and labs.  Questions were answered to the patient's satisfaction.     Lendora Keys

## 2022-09-23 NOTE — Plan of Care (Signed)
  Problem: Education: Goal: Ability to describe self-care measures that may prevent or decrease complications (Diabetes Survival Skills Education) will improve Outcome: Progressing Goal: Individualized Educational Video(s) Outcome: Progressing   Problem: Coping: Goal: Ability to adjust to condition or change in health will improve Outcome: Progressing   Problem: Fluid Volume: Goal: Ability to maintain a balanced intake and output will improve Outcome: Progressing   Problem: Health Behavior/Discharge Planning: Goal: Ability to identify and utilize available resources and services will improve Outcome: Progressing Goal: Ability to manage health-related needs will improve Outcome: Progressing   Problem: Metabolic: Goal: Ability to maintain appropriate glucose levels will improve Outcome: Progressing   Problem: Nutritional: Goal: Maintenance of adequate nutrition will improve Outcome: Progressing Goal: Progress toward achieving an optimal weight will improve Outcome: Progressing   Problem: Skin Integrity: Goal: Risk for impaired skin integrity will decrease Outcome: Progressing   Problem: Tissue Perfusion: Goal: Adequacy of tissue perfusion will improve Outcome: Progressing   Problem: Education: Goal: Knowledge of General Education information will improve Description: Including pain rating scale, medication(s)/side effects and non-pharmacologic comfort measures Outcome: Progressing   Problem: Health Behavior/Discharge Planning: Goal: Ability to manage health-related needs will improve Outcome: Progressing   Problem: Clinical Measurements: Goal: Ability to maintain clinical measurements within normal limits will improve Outcome: Progressing Goal: Will remain free from infection Outcome: Progressing Goal: Diagnostic test results will improve Outcome: Progressing Goal: Respiratory complications will improve Outcome: Progressing Goal: Cardiovascular complication will  be avoided Outcome: Progressing   Problem: Activity: Goal: Risk for activity intolerance will decrease Outcome: Progressing   Problem: Nutrition: Goal: Adequate nutrition will be maintained Outcome: Progressing   Problem: Coping: Goal: Level of anxiety will decrease Outcome: Progressing   Problem: Elimination: Goal: Will not experience complications related to bowel motility Outcome: Progressing Goal: Will not experience complications related to urinary retention Outcome: Progressing   Problem: Pain Managment: Goal: General experience of comfort will improve Outcome: Progressing   Problem: Safety: Goal: Ability to remain free from injury will improve Outcome: Progressing   Problem: Skin Integrity: Goal: Risk for impaired skin integrity will decrease Outcome: Progressing   Problem: Education: Goal: Ability to demonstrate management of disease process will improve Outcome: Progressing Goal: Ability to verbalize understanding of medication therapies will improve Outcome: Progressing Goal: Individualized Educational Video(s) Outcome: Progressing   Problem: Activity: Goal: Capacity to carry out activities will improve Outcome: Progressing   Problem: Cardiac: Goal: Ability to achieve and maintain adequate cardiopulmonary perfusion will improve Outcome: Progressing   Problem: Education: Goal: Understanding of CV disease, CV risk reduction, and recovery process will improve Outcome: Progressing Goal: Individualized Educational Video(s) Outcome: Progressing   Problem: Activity: Goal: Ability to return to baseline activity level will improve Outcome: Progressing   Problem: Cardiovascular: Goal: Ability to achieve and maintain adequate cardiovascular perfusion will improve Outcome: Progressing Goal: Vascular access site(s) Level 0-1 will be maintained Outcome: Progressing   Problem: Health Behavior/Discharge Planning: Goal: Ability to safely manage  health-related needs after discharge will improve Outcome: Progressing

## 2022-09-23 NOTE — H&P (View-Only) (Signed)
Advanced Heart Failure Rounding Note  PCP-Cardiologist: Mertie Moores, MD   Subjective:    PICC placed this am. Co-ox to be drawn. CVP set up and personally checked 9-10  Cr. 1.77> 1.98 with diuresis. S/p '80mg'$  IV lasix and metolazone yesterday. -3.7L UOP and down 9lbs.   Objective:   Weight Range: 79.8 kg Body mass index is 24.55 kg/m.   Vital Signs:   Temp:  [97.6 F (36.4 C)-98.8 F (37.1 C)] 98.5 F (36.9 C) (11/28 0721) Pulse Rate:  [62-85] 85 (11/28 0751) Resp:  [18-20] 20 (11/28 0751) BP: (107-138)/(57-90) 127/57 (11/28 0721) SpO2:  [91 %-97 %] 97 % (11/28 0751) Weight:  [79.8 kg] 79.8 kg (11/28 0439) Last BM Date : 09/21/22  Weight change: Filed Weights   09/21/22 0416 09/22/22 0418 09/23/22 0439  Weight: 83.3 kg 83.1 kg 79.8 kg    Intake/Output:   Intake/Output Summary (Last 24 hours) at 09/23/2022 0849 Last data filed at 09/23/2022 0817 Gross per 24 hour  Intake 716 ml  Output 4100 ml  Net -3384 ml      Physical Exam  CVP 9/10  General:  elderly appearing.  HEENT: normal, 4L [place] Neck: supple. JVD ~9 cm. Carotids 2+ bilat; no bruits. No lymphadenopathy or thyromegaly appreciated. Cor: PMI nondisplaced. Regular rate & rhythm. No rubs, gallops or murmurs. Lungs: clear Abdomen: soft, nontender, nondistended. No hepatosplenomegaly. No bruits or masses. Good bowel sounds. Extremities: no cyanosis, clubbing, rash, non-pitting BLE edema, +compression socks. PICC RUE Neuro: alert & oriented x 3, cranial nerves grossly intact. moves all 4 extremities w/o difficulty. Affect pleasant.   Telemetry   NSR 70s 2-4 PVC's/hr (Personally reviewed)    EKG    No new EKG to review  Labs    CBC Recent Labs    09/20/22 1129 09/20/22 2235 09/21/22 0008 09/22/22 0044  WBC 5.4   < > 5.0 6.9  NEUTROABS 3.9  --   --   --   HGB 10.3*   < > 9.2* 9.8*  HCT 33.7*   < > 30.4* 31.9*  MCV 86.9   < > 88.4 87.2  PLT 244   < > 196 220   < > = values in this  interval not displayed.   Basic Metabolic Panel Recent Labs    09/20/22 1129 09/20/22 2235 09/21/22 0008 09/22/22 0044  NA 143 143 142 140  K 2.7* 3.1* 2.9* 3.7  CL 104 109 110 108  CO2 '25 22 22 '$ 19*  GLUCOSE 108* 199* 189* 173*  BUN '21 18 18 22  '$ CREATININE 1.47* 1.59* 1.57* 1.77*  CALCIUM 8.8* 8.5* 8.3* 8.9  MG 2.5* 2.4  --   --   PHOS  --  2.4*  --   --    Liver Function Tests Recent Labs    09/21/22 0008 09/22/22 1824  AST 16 17  ALT 12 14  ALKPHOS 53 58  BILITOT 0.5 0.9  PROT 5.9* 7.0  ALBUMIN 2.7* 3.2*   No results for input(s): "LIPASE", "AMYLASE" in the last 72 hours. Cardiac Enzymes No results for input(s): "CKTOTAL", "CKMB", "CKMBINDEX", "TROPONINI" in the last 72 hours.  BNP: BNP (last 3 results) Recent Labs    11/30/21 1853 09/20/22 1129  BNP 779.9* 1,408.9*    ProBNP (last 3 results) No results for input(s): "PROBNP" in the last 8760 hours.   D-Dimer Recent Labs    09/20/22 2235  DDIMER 1.09*   Hemoglobin A1C Recent Labs    09/20/22  1550  HGBA1C 7.0*   Fasting Lipid Panel No results for input(s): "CHOL", "HDL", "LDLCALC", "TRIG", "CHOLHDL", "LDLDIRECT" in the last 72 hours. Thyroid Function Tests No results for input(s): "TSH", "T4TOTAL", "T3FREE", "THYROIDAB" in the last 72 hours.  Invalid input(s): "FREET3"  Other results:   Imaging    [place] EKG SITE RITE  Result Date: 09/22/2022 If Site Rite image not attached, placement could not be confirmed due to current cardiac rhythm.    Medications:     Scheduled Medications:  aspirin  81 mg Oral Daily   clopidogrel  75 mg Oral Daily   fluticasone furoate-vilanterol  1 puff Inhalation Daily   furosemide  80 mg Intravenous BID   heparin  5,000 Units Subcutaneous Q8H   insulin aspart  0-5 Units Subcutaneous QHS   insulin aspart  0-9 Units Subcutaneous TID WC   pantoprazole  40 mg Oral Daily   rosuvastatin  20 mg Oral Daily   umeclidinium bromide  1 puff Inhalation Daily     Infusions:   PRN Medications: acetaminophen **OR** acetaminophen, albuterol, ondansetron **OR** ondansetron (ZOFRAN) IV    Patient Profile   [name] is a 76 y.o. male with CAD c/p CABG 4098, chronic diastolic CHF, chronic DOE, Rt CEA >86yr ago, Lt CEA 14', HTN, DM2, HLD, PAD w/ Rt SFA and Rt politeal artery vascular stents 15', prostate cancer. AHF team asked to see for acute on chronic combined systolic and diastolic CHF, previously HFpEF now HFrEF, EF now 30-35%.    Assessment/Plan   Acute on chronic combined systolic and diastolic CHF, previously HFpEF now HFrEF - Echo 9/22: EF 60-65%, normal RV function, LA mildly elevated, trivial MR/TR, mild-mod aortic valve regurg - Echo EF 30-35%, mild concentric LVH, GIIDD, RV function mildly reduced, mild MR, mild to mod TR, mild aortic valve regurg - presented NYHA class IV with SOB, orthopnea and BLE edema - volume overloaded on admission suspect low output 2/2 ischemia?, COPD?, recently treated PNA?, PVCs, Cr up to 1.98 today - CVP 9/10 this am, s/p '80mg'$  IV lasix early this am, will reevaluate CVP later today. Hold further diuresis for now.  - hold BB with acute exacerbation - Can consider losartan/entresto once AKI resolved and BP improved - Hold eplerenone and Jardiance with AKI - Concern for low-output with worsening kidney function, co-ox pending. Lactic stable - May need RHC once better diuresed and LHC once renal function improves to r/o ICM - strict I&O, daily weights CAD s/p CABG  - HsTrop 79>61, suspect demand ischemia with volume overload - No chest pain, may need LHC once renal function improves -Continue Plavix, ASA, and statin  AKI on CKD st 3a - likely cardiorenal, baseline Cr. 1 - Cr 1.98, monitor with diuresis - Avoid hypotension Anemia - Tsat 9, ferritin 26, will need feraheme closer to d/c PAD - s/p intervention 15' Rt SFA and Rt politeal artery vascular stents - Continue plavix HTN - currently controlled,  can add hydralazine if needed for BP control  DM - Continue SSI, Hgb A1c last 7.5 - per primary COPD - has [place] new patient appointment scheduled for 12/8 - Continue nebs  - recently treated for PNA - May need OP sleep study, refusing CPAP/BiPAP for now  Length of Stay: [id number]  09/23/2022, 8:49 AM  Advanced Heart Failure Team Pager [phone](M-F; 7a - 5p)  Please contact [facility] for night-coverage after hours (5p -7a ) and weekends on [email or web address]  Patient seen  and examined with the above-signed Advanced Practice Provider and/or Housestaff. I personally reviewed laboratory data, imaging studies and relevant notes. I independently examined the patient and formulated the important aspects of the plan. I have edited the note to reflect any of my changes or salient points. I have personally discussed the plan with the patient and/or family.  Diuresed well. SOB improved but still weak. SCr elevated. PICC placed and Co-ox low c/w low output HF  General:  Weak appearing. No resp difficulty HEENT: normal Neck: supple. JVP 5-6  Carotids 2+ bilat; no bruits. No lymphadenopathy or thryomegaly appreciated. Cor: PMI nondisplaced. Regular rate & rhythm.2/6 TR Lungs: clear Abdomen: soft, nontender, nondistended. No hepatosplenomegaly. No bruits or masses. Good bowel sounds. Extremities: no cyanosis, clubbing, rash, edema Neuro: alert & orientedx3, cranial nerves grossly intact. moves all 4 extremities w/o difficulty. Affect pleasant  He has evidence of low output HF. Will take to cath lab for [place]. Ideally would also do coronary/CABG angio but with AKI this is not currently possible - can reconsider prior to d/c if renal function improves. D/w patient and [name] in detail.  Glori Bickers, MD  4:26 PM

## 2022-09-23 NOTE — Progress Notes (Signed)
Heart Failure Navigator Progress Note  Assessed for Heart & Vascular TOC clinic readiness.  Patient does not meet criteria due to Advanced Heart Failure Team patient of dr. Haroldine Laws.   Navigator will sign off at this time.   Earnestine Leys, BSN, Clinical cytogeneticist Only

## 2022-09-23 NOTE — Progress Notes (Signed)
Received call that someone would be coming to take patient to cath lab.

## 2022-09-23 NOTE — Progress Notes (Addendum)
Advanced Heart Failure Rounding Note  PCP-Cardiologist: Mertie Moores, MD   Subjective:    PICC placed this am. Co-ox to be drawn. CVP set up and personally checked 9-10  Cr. 1.77> 1.98 with diuresis. S/p '80mg'$  IV lasix and metolazone yesterday. -3.7L UOP and down 9lbs.   Objective:   Weight Range: 79.8 kg Body mass index is 24.55 kg/m.   Vital Signs:   Temp:  [97.6 F (36.4 C)-98.8 F (37.1 C)] 98.5 F (36.9 C) (11/28 0721) Pulse Rate:  [62-85] 85 (11/28 0751) Resp:  [18-20] 20 (11/28 0751) BP: (107-138)/(57-90) 127/57 (11/28 0721) SpO2:  [91 %-97 %] 97 % (11/28 0751) Weight:  [79.8 kg] 79.8 kg (11/28 0439) Last BM Date : 09/21/22  Weight change: Filed Weights   09/21/22 0416 09/22/22 0418 09/23/22 0439  Weight: 83.3 kg 83.1 kg 79.8 kg    Intake/Output:   Intake/Output Summary (Last 24 hours) at 09/23/2022 0849 Last data filed at 09/23/2022 0817 Gross per 24 hour  Intake 716 ml  Output 4100 ml  Net -3384 ml      Physical Exam  CVP 9/10  General:  elderly appearing.  HEENT: normal, 4L [place] Neck: supple. JVD ~9 cm. Carotids 2+ bilat; no bruits. No lymphadenopathy or thyromegaly appreciated. Cor: PMI nondisplaced. Regular rate & rhythm. No rubs, gallops or murmurs. Lungs: clear Abdomen: soft, nontender, nondistended. No hepatosplenomegaly. No bruits or masses. Good bowel sounds. Extremities: no cyanosis, clubbing, rash, non-pitting BLE edema, +compression socks. PICC RUE Neuro: alert & oriented x 3, cranial nerves grossly intact. moves all 4 extremities w/o difficulty. Affect pleasant.   Telemetry   NSR 70s 2-4 PVC's/hr (Personally reviewed)    EKG    No new EKG to review  Labs    CBC Recent Labs    09/20/22 1129 09/20/22 2235 09/21/22 0008 09/22/22 0044  WBC 5.4   < > 5.0 6.9  NEUTROABS 3.9  --   --   --   HGB 10.3*   < > 9.2* 9.8*  HCT 33.7*   < > 30.4* 31.9*  MCV 86.9   < > 88.4 87.2  PLT 244   < > 196 220   < > = values in this  interval not displayed.   Basic Metabolic Panel Recent Labs    09/20/22 1129 09/20/22 2235 09/21/22 0008 09/22/22 0044  NA 143 143 142 140  K 2.7* 3.1* 2.9* 3.7  CL 104 109 110 108  CO2 '25 22 22 '$ 19*  GLUCOSE 108* 199* 189* 173*  BUN '21 18 18 22  '$ CREATININE 1.47* 1.59* 1.57* 1.77*  CALCIUM 8.8* 8.5* 8.3* 8.9  MG 2.5* 2.4  --   --   PHOS  --  2.4*  --   --    Liver Function Tests Recent Labs    09/21/22 0008 09/22/22 1824  AST 16 17  ALT 12 14  ALKPHOS 53 58  BILITOT 0.5 0.9  PROT 5.9* 7.0  ALBUMIN 2.7* 3.2*   No results for input(s): "LIPASE", "AMYLASE" in the last 72 hours. Cardiac Enzymes No results for input(s): "CKTOTAL", "CKMB", "CKMBINDEX", "TROPONINI" in the last 72 hours.  BNP: BNP (last 3 results) Recent Labs    11/30/21 1853 09/20/22 1129  BNP 779.9* 1,408.9*    ProBNP (last 3 results) No results for input(s): "PROBNP" in the last 8760 hours.   D-Dimer Recent Labs    09/20/22 2235  DDIMER 1.09*   Hemoglobin A1C Recent Labs    09/20/22  1550  HGBA1C 7.0*   Fasting Lipid Panel No results for input(s): "CHOL", "HDL", "LDLCALC", "TRIG", "CHOLHDL", "LDLDIRECT" in the last 72 hours. Thyroid Function Tests No results for input(s): "TSH", "T4TOTAL", "T3FREE", "THYROIDAB" in the last 72 hours.  Invalid input(s): "FREET3"  Other results:   Imaging    [place] EKG SITE RITE  Result Date: 09/22/2022 If Site Rite image not attached, placement could not be confirmed due to current cardiac rhythm.    Medications:     Scheduled Medications:  aspirin  81 mg Oral Daily   clopidogrel  75 mg Oral Daily   fluticasone furoate-vilanterol  1 puff Inhalation Daily   furosemide  80 mg Intravenous BID   heparin  5,000 Units Subcutaneous Q8H   insulin aspart  0-5 Units Subcutaneous QHS   insulin aspart  0-9 Units Subcutaneous TID WC   pantoprazole  40 mg Oral Daily   rosuvastatin  20 mg Oral Daily   umeclidinium bromide  1 puff Inhalation Daily     Infusions:   PRN Medications: acetaminophen **OR** acetaminophen, albuterol, ondansetron **OR** ondansetron (ZOFRAN) IV    Patient Profile   Mr. Reznik is a 76 y.o. male with CAD c/p CABG 8882, chronic diastolic CHF, chronic DOE, Rt CEA >39yr ago, Lt CEA 14', HTN, DM2, HLD, PAD w/ Rt SFA and Rt politeal artery vascular stents 15', prostate cancer. AHF team asked to see for acute on chronic combined systolic and diastolic CHF, previously HFpEF now HFrEF, EF now 30-35%.    Assessment/Plan   Acute on chronic combined systolic and diastolic CHF, previously HFpEF now HFrEF - Echo 9/22: EF 60-65%, normal RV function, LA mildly elevated, trivial MR/TR, mild-mod aortic valve regurg - Echo EF 30-35%, mild concentric LVH, GIIDD, RV function mildly reduced, mild MR, mild to mod TR, mild aortic valve regurg - presented NYHA class IV with SOB, orthopnea and BLE edema - volume overloaded on admission suspect low output 2/2 ischemia?, COPD?, recently treated PNA?, PVCs, Cr up to 1.98 today - CVP 9/10 this am, s/p '80mg'$  IV lasix early this am, will reevaluate CVP later today. Hold further diuresis for now.  - hold BB with acute exacerbation - Can consider losartan/entresto once AKI resolved and BP improved - Hold eplerenone and Jardiance with AKI - Concern for low-output with worsening kidney function, co-ox pending. Lactic stable - May need RHC once better diuresed and LHC once renal function improves to r/o ICM - strict I&O, daily weights CAD s/p CABG  - HsTrop 79>61, suspect demand ischemia with volume overload - No chest pain, may need LHC once renal function improves -Continue Plavix, ASA, and statin  AKI on CKD st 3a - likely cardiorenal, baseline Cr. 1 - Cr 1.98, monitor with diuresis - Avoid hypotension Anemia - Tsat 9, ferritin 26, will need feraheme closer to d/c PAD - s/p intervention 15' Rt SFA and Rt politeal artery vascular stents - Continue plavix HTN - currently controlled,  can add hydralazine if needed for BP control  DM - Continue SSI, Hgb A1c last 7.5 - per primary COPD - has [place] new patient appointment scheduled for 12/8 - Continue nebs  - recently treated for PNA - May need OP sleep study, refusing CPAP/BiPAP for now  Length of Stay: [id number]  09/23/2022, 8:49 AM  Advanced Heart Failure Team Pager [phone](M-F; 7a - 5p)  Please contact [facility] for night-coverage after hours (5p -7a ) and weekends on [email or web address]  Patient seen  and examined with the above-signed Advanced Practice Provider and/or Housestaff. I personally reviewed laboratory data, imaging studies and relevant notes. I independently examined the patient and formulated the important aspects of the plan. I have edited the note to reflect any of my changes or salient points. I have personally discussed the plan with the patient and/or family.  Diuresed well. SOB improved but still weak. SCr elevated. PICC placed and Co-ox low c/w low output HF  General:  Weak appearing. No resp difficulty HEENT: normal Neck: supple. JVP 5-6  Carotids 2+ bilat; no bruits. No lymphadenopathy or thryomegaly appreciated. Cor: PMI nondisplaced. Regular rate & rhythm.2/6 TR Lungs: clear Abdomen: soft, nontender, nondistended. No hepatosplenomegaly. No bruits or masses. Good bowel sounds. Extremities: no cyanosis, clubbing, rash, edema Neuro: alert & orientedx3, cranial nerves grossly intact. moves all 4 extremities w/o difficulty. Affect pleasant  He has evidence of low output HF. Will take to cath lab for [place]. Ideally would also do coronary/CABG angio but with AKI this is not currently possible - can reconsider prior to d/c if renal function improves. D/w patient and his daughter in detail.  Glori Bickers, MD  4:26 PM

## 2022-09-23 NOTE — Progress Notes (Signed)
   09/23/22 1536  Vitals  Temp 98.5 F (36.9 C)  Temp Source Oral  BP (!) 118/53  MAP (mmHg) 72  Pulse Rate 60  Pulse Rate Source Monitor  ECG Heart Rate 61  Resp 20  Level of Consciousness  Level of Consciousness Alert  MEWS COLOR  MEWS Score Color Green  Oxygen Therapy  SpO2 91 %  MEWS Score  MEWS Temp 0  MEWS Systolic 0  MEWS Pulse 0  MEWS RR 0  MEWS LOC 0  MEWS Score 0

## 2022-09-23 NOTE — Progress Notes (Addendum)
PROGRESS NOTE    [name]  GQQ:[id number] DOB: [birth year] DOA: 09/20/2022 PCP: Haywood Pao, MD  [name] is a 76 y.o. male with medical history significant of  h/o CAD s/p CABG, chronic diastolic CHF, COPD, PAD s/p vascular intervention right leg 2015,, DM, HTN, HLD,  prostate CA presenting with SOB and edema, O2 sat 87% on RA.  New O2 requirement, 4L.  Compliant with Lasix.    BNP 1400, CXR with edema.   Subjective: -Breathing better overall, denies any chest pain  Assessment and Plan:  Acute hypoxic resp failure Acute systolic and diastolic heart failure  Hypoalbumonemia -last ECHo 9/22 w/ normal EF, grade 2 DD, normal RV, mod AI -Repeat echo with EF down to 30-35% and regional wall motion abnormalities, appreciate cardiology input,  -Bump in creatinine with diuresis, overtly does not appear low output, LFTs and lactate normal, holding further diuretics and Coreg, check Renal US -May need right heart cath -Monitor/I's/O, daily weights   h/o CAD s/p CABG 2003 -continue plavix/BB -Echo with worsening cardiomyopathy and regional WMA -Needs left heart cath if kidney function improves  AKI on CKD3a -Baseline creatinine around 1.3-1.4, now 1.9, getting down diuretics -May need right heart cath -Hold benazepril, check renal US   PAD  -s/p vascular intervention right leg 2015 -continue plavix    DM -CBG elevated, continue sliding scale insulin, hold metformin -last A1c  7.5, add Levemir   Hypertension -Hold benazepril,     Prostate CA  -continue to follow with urology    Normocytic anemia -Anemia panel with iron deficiency, given IV iron  DVT prophylaxis: hep SQ Code Status: Full Code Family Communication: Discussed with patient daughter at bedside Disposition Plan: Home pending cardiac eval  Consultants:    Procedures:   Antimicrobials:    Objective: Vitals:   09/23/22 0439 09/23/22 0721 09/23/22 0751 09/23/22 1053  BP: 138/68 (!) 127/57   (!) 121/53  Pulse: 77 62 85 60  Resp: '18 20 20 18  '$ Temp: 98.8 F (37.1 C) 98.5 F (36.9 C)  97.9 F (36.6 C)  TempSrc: Oral Oral  Oral  SpO2: 95% 96% 97% 99%  Weight: 79.8 kg     Height:        Intake/Output Summary (Last 24 hours) at 09/23/2022 1246 Last data filed at 09/23/2022 1049 Gross per 24 hour  Intake 476 ml  Output 4725 ml  Net -4249 ml   Filed Weights   09/21/22 0416 09/22/22 0418 09/23/22 0439  Weight: 83.3 kg 83.1 kg 79.8 kg    Examination:  General exam: Elderly male sitting up in bed, AAO x 2, mild cognitive deficits HEENT: Positive JVD CVS: S1-S2, regular rhythm Lungs: Few basilar rales, otherwise poor air movement bilaterally Abdomen: Soft, nontender, bowel sounds present Extremities: 1+ edema  Skin: No rashes Psychiatry:  Mood & affect appropriate.     Data Reviewed:   CBC: Recent Labs  Lab 09/20/22 1129 09/20/22 2235 09/21/22 0008 09/22/22 0044  WBC 5.4 4.8 5.0 6.9  NEUTROABS 3.9  --   --   --   HGB 10.3* 9.9* 9.2* 9.8*  HCT 33.7* 32.0* 30.4* 31.9*  MCV 86.9 87.9 88.4 87.2  PLT 244 198 196 671   Basic Metabolic Panel: Recent Labs  Lab 09/20/22 1129 09/20/22 2235 09/21/22 0008 09/22/22 0044 09/23/22 0757  NA 143 143 142 140 135  K 2.7* 3.1* 2.9* 3.7 3.7  CL 104 109 110 108 96*  CO2 '25 22 22 '$ 19*  26  GLUCOSE 108* 199* 189* 173* 210*  BUN '21 18 18 22 '$ 26*  CREATININE 1.47* 1.59* 1.57* 1.77* 1.98*  CALCIUM 8.8* 8.5* 8.3* 8.9 9.5  MG 2.5* 2.4  --   --   --   PHOS  --  2.4*  --   --   --    GFR: Estimated Creatinine Clearance: 34.3 mL/min (A) (by C-G formula based on SCr of 1.98 mg/dL (H)). Liver Function Tests: Recent Labs  Lab 09/21/22 0008 09/22/22 1824  AST 16 17  ALT 12 14  ALKPHOS 53 58  BILITOT 0.5 0.9  PROT 5.9* 7.0  ALBUMIN 2.7* 3.2*   No results for input(s): "LIPASE", "AMYLASE" in the last 168 hours. No results for input(s): "AMMONIA" in the last 168 hours. Coagulation Profile: No results for input(s):  "INR", "PROTIME" in the last 168 hours. Cardiac Enzymes: No results for input(s): "CKTOTAL", "CKMB", "CKMBINDEX", "TROPONINI" in the last 168 hours. BNP (last 3 results) No results for input(s): "PROBNP" in the last 8760 hours. HbA1C: Recent Labs    09/20/22 1550  HGBA1C 7.0*   CBG: Recent Labs  Lab 09/22/22 1056 09/22/22 1555 09/22/22 2127 09/23/22 0621 09/23/22 1052  GLUCAP 195* 164* 185* 155* 263*   Lipid Profile: No results for input(s): "CHOL", "HDL", "LDLCALC", "TRIG", "CHOLHDL", "LDLDIRECT" in the last 72 hours. Thyroid Function Tests: No results for input(s): "TSH", "T4TOTAL", "FREET4", "T3FREE", "THYROIDAB" in the last 72 hours. Anemia Panel: Recent Labs    09/21/22 1155  VITAMINB12 104*  FOLATE 13.1  FERRITIN 26  TIBC 314  IRON 29*  RETICCTPCT 2.6   Urine analysis:    Component Value Date/Time   COLORURINE YELLOW 07/19/2013 [place] 07/19/2013 1223   LABSPEC 1.013 07/19/2013 1223   PHURINE 5.5 07/19/2013 1223   GLUCOSEU 500 (A) 07/19/2013 1223   HGBUR NEGATIVE 07/19/2013 1223   BILIRUBINUR NEGATIVE 07/19/2013 1223   KETONESUR NEGATIVE 07/19/2013 1223   PROTEINUR NEGATIVE 07/19/2013 1223   UROBILINOGEN 1.0 07/19/2013 1223   NITRITE NEGATIVE 07/19/2013 1223   LEUKOCYTESUR NEGATIVE 07/19/2013 1223   Sepsis Labs: '@LABRCNTIP'$ (procalcitonin:4,lacticidven:4)  ) Recent Results (from the past 240 hour(s))  Resp Panel by RT-PCR (Flu A&B, Covid) Anterior Nasal Swab     Status: None   Collection Time: 09/20/22  1:54 PM   Specimen: Anterior Nasal Swab  Result Value Ref Range Status   SARS Coronavirus 2 by RT PCR NEGATIVE NEGATIVE Final    Comment: (NOTE) SARS-CoV-2 target nucleic acids are NOT DETECTED.  The SARS-CoV-2 RNA is generally detectable in upper respiratory specimens during the acute phase of infection. The lowest concentration of SARS-CoV-2 viral copies this assay can detect is 138 copies/mL. A negative result does not  preclude SARS-Cov-2 infection and should not be used as the sole basis for treatment or other patient management decisions. A negative result may occur with  improper specimen collection/handling, submission of specimen other than nasopharyngeal swab, presence of viral mutation(s) within the areas targeted by this assay, and inadequate number of viral copies(<138 copies/mL). A negative result must be combined with clinical observations, patient history, and epidemiological information. The expected result is Negative.  Fact Sheet for Patients:  EntrepreneurPulse.com.au  Fact Sheet for Healthcare Providers:  IncredibleEmployment.be  This test is no t yet approved or cleared by the [place] FDA and  has been authorized for detection and/or diagnosis of SARS-CoV-2 by FDA under an Emergency Use Authorization (EUA). This EUA will remain  in effect (meaning this  test can be used) for the duration of the COVID-19 declaration under Section 564(b)(1) of the Act, 21 U.S.C.section 360bbb-3(b)(1), unless the authorization is terminated  or revoked sooner.       Influenza A by PCR NEGATIVE NEGATIVE Final   Influenza B by PCR NEGATIVE NEGATIVE Final    Comment: (NOTE) The Xpert Xpress SARS-CoV-2/FLU/RSV plus assay is intended as an aid in the diagnosis of influenza from Nasopharyngeal swab specimens and should not be used as a sole basis for treatment. Nasal washings and aspirates are unacceptable for Xpert Xpress SARS-CoV-2/FLU/RSV testing.  Fact Sheet for Patients: EntrepreneurPulse.com.au  Fact Sheet for Healthcare Providers: IncredibleEmployment.be  This test is not yet approved or cleared by the [place] FDA and has been authorized for detection and/or diagnosis of SARS-CoV-2 by FDA under an Emergency Use Authorization (EUA). This EUA will remain in effect (meaning this test can be used) for the  duration of the COVID-19 declaration under Section 564(b)(1) of the Act, 21 U.S.C. section 360bbb-3(b)(1), unless the authorization is terminated or revoked.  Performed at [facility], [address] [place], [place] [address]   Respiratory (~20 pathogens) panel by PCR     Status: None   Collection Time: 09/21/22  8:35 AM   Specimen: Nasopharyngeal Swab; Respiratory  Result Value Ref Range Status   Adenovirus NOT DETECTED NOT DETECTED Final   Coronavirus 229E NOT DETECTED NOT DETECTED Final    Comment: (NOTE) The Coronavirus on the Respiratory Panel, DOES NOT test for the novel  Coronavirus (2019 nCoV)    Coronavirus HKU1 NOT DETECTED NOT DETECTED Final   Coronavirus NL63 NOT DETECTED NOT DETECTED Final   Coronavirus OC43 NOT DETECTED NOT DETECTED Final   Metapneumovirus NOT DETECTED NOT DETECTED Final   Rhinovirus / Enterovirus NOT DETECTED NOT DETECTED Final   Influenza A NOT DETECTED NOT DETECTED Final   Influenza B NOT DETECTED NOT DETECTED Final   Parainfluenza Virus 1 NOT DETECTED NOT DETECTED Final   Parainfluenza Virus 2 NOT DETECTED NOT DETECTED Final   Parainfluenza Virus 3 NOT DETECTED NOT DETECTED Final   Parainfluenza Virus 4 NOT DETECTED NOT DETECTED Final   Respiratory Syncytial Virus NOT DETECTED NOT DETECTED Final   Bordetella pertussis NOT DETECTED NOT DETECTED Final   Bordetella Parapertussis NOT DETECTED NOT DETECTED Final   Chlamydophila pneumoniae NOT DETECTED NOT DETECTED Final   Mycoplasma pneumoniae NOT DETECTED NOT DETECTED Final    Comment: Performed at [facility], [address] [place], [place] [address]     Radiology Studies: [place] EKG SITE RITE  Result Date: 09/22/2022 If Site Rite image not attached, placement could not be confirmed due to current cardiac rhythm.  ECHOCARDIOGRAM COMPLETE  Result Date: 09/21/2022    ECHOCARDIOGRAM REPORT   Patient Name:   KEILEN KAHL Date of Exam: 09/21/2022 Medical Rec #:   [id number]     Height:       71.0 in Accession #:    [id number]    Weight:       183.7 lb Date of Birth:  Feb 07, 1946     BSA:          2.034 m Patient Age:    70 years      BP:           132/62 mmHg Patient Gender: M             HR:           67 bpm. Exam Location:  Inpatient Procedure: 2D Echo and Intracardiac Opacification Agent Indications:    CHF  History:        Patient has prior history of Echocardiogram examinations, most                 recent 07/03/2021. CAD; Risk Factors:Hypertension, Diabetes and                 Dyslipidemia.  Sonographer:    Harvie Junior Referring Phys: [place]  Sonographer Comments: Technically difficult study due to poor echo windows. IMPRESSIONS  1. Left ventricular ejection fraction, by estimation, is 30 to 35%. The left ventricle has moderately decreased function. The left ventricle demonstrates regional wall motion abnormalities (see scoring diagram/findings for description). There is mild concentric left ventricular hypertrophy. Left ventricular diastolic parameters are consistent with Grade II diastolic dysfunction (pseudonormalization). There is the interventricular septum is flattened in systole and diastole, consistent with right ventricular pressure and volume overload.  2. Right ventricular systolic function is mildly reduced. The right ventricular size is normal. There is mildly elevated pulmonary artery systolic pressure. The estimated right ventricular systolic pressure is 29.5 mmHg.  3. Moderate pleural effusion in the left lateral region.  4. The mitral valve is grossly normal. Mild mitral valve regurgitation.  5. Tricuspid valve regurgitation is mild to moderate.  6. The aortic valve is tricuspid. There is moderate calcification of the aortic valve. Aortic valve regurgitation is mild. Aortic regurgitation PHT measures 709 msec. Aortic valve mean gradient measures 3.5 mmHg.  7. The inferior vena cava is normal in size with <50% respiratory variability,  suggesting right atrial pressure of 8 mmHg. Comparison(s): Prior images reviewed side by side. LVEF has decreased in comparison with progressive wall motion abnormalities. FINDINGS  Left Ventricle: Left ventricular ejection fraction, by estimation, is 30 to 35%. The left ventricle has moderately decreased function. The left ventricle demonstrates regional wall motion abnormalities. Definity contrast agent was given IV to delineate the left ventricular endocardial borders. The left ventricular internal cavity size was normal in size. There is mild concentric left ventricular hypertrophy. The interventricular septum is flattened in systole and diastole, consistent with right ventricular pressure and volume overload. Left ventricular diastolic parameters are consistent with Grade II diastolic dysfunction (pseudonormalization).  LV Wall Scoring: The inferior wall, posterior wall, mid inferoseptal segment, basal inferoseptal segment, and apex are akinetic. The entire anterior septum and apical inferior segment are hypokinetic. The entire anterior wall, antero-lateral wall, and apical lateral segment are normal. Right Ventricle: The right ventricular size is normal. No increase in right ventricular wall thickness. Right ventricular systolic function is mildly reduced. There is mildly elevated pulmonary artery systolic pressure. The tricuspid regurgitant velocity  is 2.93 m/s, and with an assumed right atrial pressure of 8 mmHg, the estimated right ventricular systolic pressure is 18.8 mmHg. Left Atrium: Left atrial size was normal in size. Right Atrium: Right atrial size was normal in size. Pericardium: There is no evidence of pericardial effusion. Mitral Valve: The mitral valve is grossly normal. Mild mitral annular calcification. Mild mitral valve regurgitation. Tricuspid Valve: The tricuspid valve is grossly normal. Tricuspid valve regurgitation is mild to moderate. Aortic Valve: The aortic valve is tricuspid. There is  moderate calcification of the aortic valve. There is mild aortic valve annular calcification. Aortic valve regurgitation is mild. Aortic regurgitation PHT measures 709 msec. Aortic valve mean gradient  measures 3.5 mmHg. Aortic valve peak gradient measures 8.4 mmHg. Aortic valve area, by VTI measures 1.95 cm. Pulmonic  Valve: The pulmonic valve was grossly normal. Pulmonic valve regurgitation is trivial. Aorta: The aortic root is normal in size and structure. Venous: The inferior vena cava is normal in size with less than 50% respiratory variability, suggesting right atrial pressure of 8 mmHg. IAS/Shunts: No atrial level shunt detected by color flow Doppler. Additional Comments: There is a moderate pleural effusion in the left lateral region.  LEFT VENTRICLE PLAX 2D LVIDd:         5.30 cm      Diastology LVIDs:         4.30 cm      LV e' medial:    4.46 cm/s LV PW:         1.10 cm      LV E/e' medial:  30.9 LV IVS:        1.20 cm      LV e' lateral:   8.92 cm/s LVOT diam:     1.80 cm      LV E/e' lateral: 15.5 LV SV:         56 LV SV Index:   27 LVOT Area:     2.54 cm  LV Volumes (MOD) LV vol d, MOD A2C: 196.0 ml LV vol d, MOD A4C: 241.0 ml LV vol s, MOD A2C: 171.0 ml LV vol s, MOD A4C: 152.0 ml LV SV MOD A2C:     25.0 ml LV SV MOD A4C:     241.0 ml LV SV MOD BP:      64.2 ml RIGHT VENTRICLE RV Basal diam:  3.80 cm RV Mid diam:    3.40 cm RV S prime:     5.44 cm/s TAPSE (M-mode): 1.3 cm LEFT ATRIUM             Index        RIGHT ATRIUM           Index LA diam:        4.20 cm 2.07 cm/m   RA Area:     15.20 cm LA Vol (A2C):   54.3 ml 26.70 ml/m  RA Volume:   40.30 ml  19.82 ml/m LA Vol (A4C):   59.9 ml 29.45 ml/m LA Biplane Vol: 59.4 ml 29.21 ml/m  AORTIC VALVE                    PULMONIC VALVE AV Area (Vmax):    1.76 cm     PV Vmax:       1.26 m/s AV Area (Vmean):   1.86 cm     PV Peak grad:  6.4 mmHg AV Area (VTI):     1.95 cm AV Vmax:           144.50 cm/s AV Vmean:          83.750 cm/s AV VTI:             0.286 m AV Peak Grad:      8.4 mmHg AV Mean Grad:      3.5 mmHg LVOT Vmax:         99.80 cm/s LVOT Vmean:        61.200 cm/s LVOT VTI:          0.219 m LVOT/AV VTI ratio: 0.77 AI PHT:            709 msec  AORTA Ao Root diam: 3.00 cm MITRAL VALVE                TRICUSPID  VALVE MV Area (PHT): 4.39 cm     TR Peak grad:   34.3 mmHg MV Decel Time: 173 msec     TR Vmax:        293.00 cm/s MR Peak grad: 26.1 mmHg MR Vmax:      255.33 cm/s   SHUNTS MV E velocity: 138.00 cm/s  Systemic VTI:  0.22 m MV A velocity: 55.60 cm/s   Systemic Diam: 1.80 cm MV E/A ratio:  2.48 Rozann Lesches MD Electronically signed by Rozann Lesches MD Signature Date/Time: 09/21/2022/3:26:12 PM    Final      Scheduled Meds:  aspirin  81 mg Oral Daily   Chlorhexidine Gluconate Cloth  6 each Topical Daily   clopidogrel  75 mg Oral Daily   fluticasone furoate-vilanterol  1 puff Inhalation Daily   heparin  5,000 Units Subcutaneous Q8H   insulin aspart  0-5 Units Subcutaneous QHS   insulin aspart  0-9 Units Subcutaneous TID WC   pantoprazole  40 mg Oral Daily   rosuvastatin  20 mg Oral Daily   sodium chloride flush  10-40 mL Intracatheter Q12H   umeclidinium bromide  1 puff Inhalation Daily   Continuous Infusions:   LOS: 3 days    Time spent: 18mn    PDomenic Polite MD [facility]   09/23/2022, 12:46 PM

## 2022-09-24 ENCOUNTER — Encounter (HOSPITAL_COMMUNITY): Payer: Self-pay | Admitting: Internal Medicine

## 2022-09-24 DIAGNOSIS — I5033 Acute on chronic diastolic (congestive) heart failure: Secondary | ICD-10-CM | POA: Diagnosis not present

## 2022-09-24 LAB — COMPREHENSIVE METABOLIC PANEL
ALT: 13 U/L (ref 0–44)
AST: 18 U/L (ref 15–41)
Albumin: 2.9 g/dL — ABNORMAL LOW (ref 3.5–5.0)
Alkaline Phosphatase: 63 U/L (ref 38–126)
Anion gap: 11 (ref 5–15)
BUN: 37 mg/dL — ABNORMAL HIGH (ref 8–23)
CO2: 28 mmol/L (ref 22–32)
Calcium: 8.8 mg/dL — ABNORMAL LOW (ref 8.9–10.3)
Chloride: 96 mmol/L — ABNORMAL LOW (ref 98–111)
Creatinine, Ser: 2.14 mg/dL — ABNORMAL HIGH (ref 0.61–1.24)
GFR, Estimated: 31 mL/min — ABNORMAL LOW (ref >60)
Glucose, Bld: 212 mg/dL — ABNORMAL HIGH (ref 70–99)
Potassium: 3.3 mmol/L — ABNORMAL LOW (ref 3.5–5.1)
Sodium: 135 mmol/L (ref 135–145)
Total Bilirubin: 0.9 mg/dL (ref 0.3–1.2)
Total Protein: 6.3 g/dL — ABNORMAL LOW (ref 6.5–8.1)

## 2022-09-24 LAB — GLUCOSE, CAPILLARY
Glucose-Capillary: 201 mg/dL — ABNORMAL HIGH (ref 70–99)
Glucose-Capillary: 260 mg/dL — ABNORMAL HIGH (ref 70–99)
Glucose-Capillary: 309 mg/dL — ABNORMAL HIGH (ref 70–99)
Glucose-Capillary: 222 mg/dL — ABNORMAL HIGH (ref 70–99)

## 2022-09-24 LAB — CBC
HCT: 30.7 % — ABNORMAL LOW (ref 39.0–52.0)
Hemoglobin: 9.4 g/dL — ABNORMAL LOW (ref 13.0–17.0)
MCH: 26.3 pg (ref 26.0–34.0)
MCHC: 30.6 g/dL (ref 30.0–36.0)
MCV: 85.8 fL (ref 80.0–100.0)
Platelets: 185 10*3/uL (ref 150–400)
RBC: 3.58 MIL/uL — ABNORMAL LOW (ref 4.22–5.81)
RDW: 15.9 % — ABNORMAL HIGH (ref 11.5–15.5)
WBC: 4.8 10*3/uL (ref 4.0–10.5)
nRBC: 0 % (ref 0.0–0.2)

## 2022-09-24 LAB — COOXEMETRY PANEL
Carboxyhemoglobin: 1.9 % — ABNORMAL HIGH (ref 0.5–1.5)
Methemoglobin: 1 % (ref 0.0–1.5)
O2 Saturation: 52.9 %
Total hemoglobin: 9.8 g/dL — ABNORMAL LOW (ref 12.0–16.0)

## 2022-09-24 MED ORDER — POTASSIUM CHLORIDE CRYS ER 20 MEQ PO TBCR
40.0000 meq | EXTENDED_RELEASE_TABLET | Freq: Once | ORAL | Status: AC
  Administered 2022-09-24: 40 meq via ORAL
  Filled 2022-09-24: qty 2

## 2022-09-24 MED ORDER — SODIUM CHLORIDE 0.9 % IV SOLN
510.0000 mg | Freq: Once | INTRAVENOUS | Status: AC
  Administered 2022-09-24: 510 mg via INTRAVENOUS
  Filled 2022-09-24: qty 17

## 2022-09-24 MED ORDER — INSULIN ASPART 100 UNIT/ML IJ SOLN
2.0000 [IU] | Freq: Three times a day (TID) | INTRAMUSCULAR | Status: DC
  Administered 2022-09-24 – 2022-09-25 (×4): 2 [IU] via SUBCUTANEOUS

## 2022-09-24 MED ORDER — INSULIN DETEMIR 100 UNIT/ML ~~LOC~~ SOLN
20.0000 [IU] | Freq: Every day | SUBCUTANEOUS | Status: DC
  Administered 2022-09-24 – 2022-09-25 (×2): 20 [IU] via SUBCUTANEOUS
  Filled 2022-09-24 (×2): qty 0.2

## 2022-09-24 NOTE — Plan of Care (Signed)
  Problem: Coping: Goal: Ability to adjust to condition or change in health will improve Outcome: Progressing   Problem: Activity: Goal: Risk for activity intolerance will decrease Outcome: Progressing

## 2022-09-24 NOTE — Progress Notes (Signed)
PROGRESS NOTE    [name]  YOV:[id number] DOB: March 30, 1946 DOA: 09/20/2022 PCP: Haywood Pao, MD  [name] is a 76 y.o. male with medical history significant of  h/o CAD s/p CABG, chronic diastolic CHF, COPD, PAD s/p vascular intervention right leg 2015,, DM, HTN, HLD,  prostate CA presenting with SOB and edema, O2 sat 87% on RA.  New O2 requirement, 4-5L.  Compliant with Lasix.    BNP 1400, CXR with edema.  -Admitted, started on diuretics, improving, echo with EF down to 30% with regional wall motion abnormality, cards consulted, complicated by worsening AKI, RHC with normal filling pressures and normal cardiac output, now diuretics held  Subjective: -Breathing better overall, denies any chest pain  Assessment and Plan:  Acute hypoxic resp failure Acute systolic and diastolic heart failure  Hypoalbumonemia -last ECHo 9/22 w/ normal EF, grade 2 DD, normal RV, mod AI -Repeat echo with EF down to 30-35% and regional wall motion abnormalities, appreciate cardiology input,  -Diuresed initially, 8.5 L negative, creatinine trended up to 2, right heart cath with normal filling pressures, mild PAH, normal cardiac output and wedge of 13, now diuretics on hold -GDMT limited by AKI/CKD -Will need O2 at discharge -Monitor I's/O, daily weights, BMP in a.m.   h/o CAD s/p CABG 2003 -continue plavix/BB -Echo with worsening cardiomyopathy and regional WMA -Needs left heart cath if kidney function improves  AKI on CKD3a -Baseline creatinine around 1.3-1.4, now 2 -Right heart cath with normal filling pressures, diuretics on hold, preserved cardiac output -Hold benazepril, renal ultrasound without hydronephrosis   PAD  -s/p vascular intervention right leg 2015 -continue plavix    DM -CBG elevated, continue sliding scale insulin, hold metformin -last A1c  7.5, started on Levemir, will increase dose, add meal coverage   Hypertension -Hold benazepril,     Prostate CA  -continue  to follow with urology    Normocytic anemia -Anemia panel with iron deficiency, given IV iron  DVT prophylaxis: hep SQ Code Status: Full Code Family Communication: Discussed with patient detail, no family at bedside today Disposition Plan: Home likely in 48 hours  Consultants: Cardiology   Procedures:   Antimicrobials:    Objective: Vitals:   09/23/22 2019 09/24/22 0518 09/24/22 0717 09/24/22 1055  BP: 110/70 (!) 130/49 (!) 124/57 (!) 120/55  Pulse: 62 61 66 (!) 55  Resp: '19 19 18 20  '$ Temp: 97.9 F (36.6 C) 97.7 F (36.5 C) 97.8 F (36.6 C) 98.9 F (37.2 C)  TempSrc: Oral Oral  Oral  SpO2: 98% 98% 90% 95%  Weight:  77.5 kg    Height:        Intake/Output Summary (Last 24 hours) at 09/24/2022 1222 Last data filed at 09/24/2022 1100 Gross per 24 hour  Intake 717 ml  Output 2225 ml  Net -1508 ml   Filed Weights   09/22/22 0418 09/23/22 0439 09/24/22 0518  Weight: 83.1 kg 79.8 kg 77.5 kg    Examination:  General exam: Elderly male sitting up in bed, AAO x 3, no distress HEENT: No JVD CVS: S1-S2, regular rhythm Lungs: Few basilar Rales, poor air movement bilaterally Abdomen: Soft, nontender, bowel sounds present Extremities: Trace edema  Skin: No rashes Psychiatry:  Mood & affect appropriate.     Data Reviewed:   CBC: Recent Labs  Lab 09/20/22 1129 09/20/22 2235 09/21/22 0008 09/22/22 0044 09/23/22 1651 09/23/22 2000 09/24/22 0658  WBC 5.4 4.8 5.0 6.9  --  5.6 4.8  NEUTROABS  3.9  --   --   --   --   --   --   HGB 10.3* 9.9* 9.2* 9.8* 9.9*  10.5* 9.9* 9.4*  HCT 33.7* 32.0* 30.4* 31.9* 29.0*  31.0* 31.7* 30.7*  MCV 86.9 87.9 88.4 87.2  --  85.9 85.8  PLT 244 198 196 220  --  210 149   Basic Metabolic Panel: Recent Labs  Lab 09/20/22 1129 09/20/22 2235 09/21/22 0008 09/22/22 0044 09/23/22 0757 09/23/22 1651 09/23/22 2000 09/24/22 0658  NA 143 143 142 140 135 140  138  --  135  K 2.7* 3.1* 2.9* 3.7 3.7 3.0*  3.3*  --  3.3*  CL 104  109 110 108 96*  --   --  96*  CO2 '25 22 22 '$ 19* 26  --   --  28  GLUCOSE 108* 199* 189* 173* 210*  --   --  212*  BUN '21 18 18 22 '$ 26*  --   --  37*  CREATININE 1.47* 1.59* 1.57* 1.77* 1.98*  --  2.70* 2.14*  CALCIUM 8.8* 8.5* 8.3* 8.9 9.5  --   --  8.8*  MG 2.5* 2.4  --   --   --   --   --   --   PHOS  --  2.4*  --   --   --   --   --   --    GFR: Estimated Creatinine Clearance: 31.8 mL/min (A) (by C-G formula based on SCr of 2.14 mg/dL (H)). Liver Function Tests: Recent Labs  Lab 09/21/22 0008 09/22/22 1824 09/24/22 0658  AST '16 17 18  '$ ALT '12 14 13  '$ ALKPHOS 53 58 63  BILITOT 0.5 0.9 0.9  PROT 5.9* 7.0 6.3*  ALBUMIN 2.7* 3.2* 2.9*   No results for input(s): "LIPASE", "AMYLASE" in the last 168 hours. No results for input(s): "AMMONIA" in the last 168 hours. Coagulation Profile: No results for input(s): "INR", "PROTIME" in the last 168 hours. Cardiac Enzymes: No results for input(s): "CKTOTAL", "CKMB", "CKMBINDEX", "TROPONINI" in the last 168 hours. BNP (last 3 results) No results for input(s): "PROBNP" in the last 8760 hours. HbA1C: No results for input(s): "HGBA1C" in the last 72 hours.  CBG: Recent Labs  Lab 09/23/22 1052 09/23/22 1534 09/23/22 2126 09/24/22 0631 09/24/22 1054  GLUCAP 263* 168* 235* 201* 260*   Lipid Profile: No results for input(s): "CHOL", "HDL", "LDLCALC", "TRIG", "CHOLHDL", "LDLDIRECT" in the last 72 hours. Thyroid Function Tests: No results for input(s): "TSH", "T4TOTAL", "FREET4", "T3FREE", "THYROIDAB" in the last 72 hours. Anemia Panel: No results for input(s): "VITAMINB12", "FOLATE", "FERRITIN", "TIBC", "IRON", "RETICCTPCT" in the last 72 hours.  Urine analysis:    Component Value Date/Time   COLORURINE YELLOW 07/19/2013 [place] 07/19/2013 1223   LABSPEC 1.013 07/19/2013 1223   PHURINE 5.5 07/19/2013 1223   GLUCOSEU 500 (A) 07/19/2013 1223   HGBUR NEGATIVE 07/19/2013 1223   BILIRUBINUR NEGATIVE 07/19/2013 1223    KETONESUR NEGATIVE 07/19/2013 1223   PROTEINUR NEGATIVE 07/19/2013 1223   UROBILINOGEN 1.0 07/19/2013 1223   NITRITE NEGATIVE 07/19/2013 1223   LEUKOCYTESUR NEGATIVE 07/19/2013 1223   Sepsis Labs: '@LABRCNTIP'$ (procalcitonin:4,lacticidven:4)  ) Recent Results (from the past 240 hour(s))  Resp Panel by RT-PCR (Flu A&B, Covid) Anterior Nasal Swab     Status: None   Collection Time: 09/20/22  1:54 PM   Specimen: Anterior Nasal Swab  Result Value Ref Range Status   SARS Coronavirus 2 by RT  PCR NEGATIVE NEGATIVE Final    Comment: (NOTE) SARS-CoV-2 target nucleic acids are NOT DETECTED.  The SARS-CoV-2 RNA is generally detectable in upper respiratory specimens during the acute phase of infection. The lowest concentration of SARS-CoV-2 viral copies this assay can detect is 138 copies/mL. A negative result does not preclude SARS-Cov-2 infection and should not be used as the sole basis for treatment or other patient management decisions. A negative result may occur with  improper specimen collection/handling, submission of specimen other than nasopharyngeal swab, presence of viral mutation(s) within the areas targeted by this assay, and inadequate number of viral copies(<138 copies/mL). A negative result must be combined with clinical observations, patient history, and epidemiological information. The expected result is Negative.  Fact Sheet for Patients:  EntrepreneurPulse.com.au  Fact Sheet for Healthcare Providers:  IncredibleEmployment.be  This test is no t yet approved or cleared by the [place] FDA and  has been authorized for detection and/or diagnosis of SARS-CoV-2 by FDA under an Emergency Use Authorization (EUA). This EUA will remain  in effect (meaning this test can be used) for the duration of the COVID-19 declaration under Section 564(b)(1) of the Act, 21 U.S.C.section 360bbb-3(b)(1), unless the authorization is terminated  or  revoked sooner.       Influenza A by PCR NEGATIVE NEGATIVE Final   Influenza B by PCR NEGATIVE NEGATIVE Final    Comment: (NOTE) The Xpert Xpress SARS-CoV-2/FLU/RSV plus assay is intended as an aid in the diagnosis of influenza from Nasopharyngeal swab specimens and should not be used as a sole basis for treatment. Nasal washings and aspirates are unacceptable for Xpert Xpress SARS-CoV-2/FLU/RSV testing.  Fact Sheet for Patients: EntrepreneurPulse.com.au  Fact Sheet for Healthcare Providers: IncredibleEmployment.be  This test is not yet approved or cleared by the [place] FDA and has been authorized for detection and/or diagnosis of SARS-CoV-2 by FDA under an Emergency Use Authorization (EUA). This EUA will remain in effect (meaning this test can be used) for the duration of the COVID-19 declaration under Section 564(b)(1) of the Act, 21 U.S.C. section 360bbb-3(b)(1), unless the authorization is terminated or revoked.  Performed at [facility], [address], [place], [place] [address]   Respiratory (~20 pathogens) panel by PCR     Status: None   Collection Time: 09/21/22  8:35 AM   Specimen: Nasopharyngeal Swab; Respiratory  Result Value Ref Range Status   Adenovirus NOT DETECTED NOT DETECTED Final   Coronavirus 229E NOT DETECTED NOT DETECTED Final    Comment: (NOTE) The Coronavirus on the Respiratory Panel, DOES NOT test for the novel  Coronavirus (2019 nCoV)    Coronavirus HKU1 NOT DETECTED NOT DETECTED Final   Coronavirus NL63 NOT DETECTED NOT DETECTED Final   Coronavirus OC43 NOT DETECTED NOT DETECTED Final   Metapneumovirus NOT DETECTED NOT DETECTED Final   Rhinovirus / Enterovirus NOT DETECTED NOT DETECTED Final   Influenza A NOT DETECTED NOT DETECTED Final   Influenza B NOT DETECTED NOT DETECTED Final   Parainfluenza Virus 1 NOT DETECTED NOT DETECTED Final   Parainfluenza Virus 2 NOT DETECTED NOT  DETECTED Final   Parainfluenza Virus 3 NOT DETECTED NOT DETECTED Final   Parainfluenza Virus 4 NOT DETECTED NOT DETECTED Final   Respiratory Syncytial Virus NOT DETECTED NOT DETECTED Final   Bordetella pertussis NOT DETECTED NOT DETECTED Final   Bordetella Parapertussis NOT DETECTED NOT DETECTED Final   Chlamydophila pneumoniae NOT DETECTED NOT DETECTED Final   Mycoplasma pneumoniae NOT DETECTED NOT DETECTED Final  Comment: Performed at [facility], [place] [address], [place], [place] [address]     Radiology Studies: CARDIAC CATHETERIZATION  Result Date: 09/23/2022 Findings: RA = 8 RV = 48/10 PA = 45/20 (34) PCW = 13 Fick cardiac output/index = 6.0/3.0 Thermo CO/CI = 4.8/2.4 PVR = 3.3 WU (Fick) Ao sat = 90% PA sat = 55%, 58% Assessment: 1. Mild PAH with normal left-sided filling pressures and normal output Plan/Discussion: Medical therapy. Hold diuretics. Likely needs home O2. Glori Bickers, MD 5:01 PM  US RENAL  Result Date: 09/23/2022 CLINICAL DATA:  Acute renal insufficiency EXAM: RENAL / URINARY TRACT ULTRASOUND COMPLETE COMPARISON:  CT 12/16/2018 FINDINGS: Right Kidney: Renal measurements: 9.0 x 4.5 x 3.4 cm = volume: 72 mL. Echogenicity within normal limits. No mass or hydronephrosis visualized. Multiple simple exophytic cortical cysts are identified arising from the upper and lower pole, compatible with Bosniak class 1 cysts measuring up to 4.5 cm. No follow-up imaging is recommended for these lesions. Left Kidney: Renal measurements: 8.5 x 5.0 x 5.1 cm = volume: 116 mL. Echogenicity within normal limits. No mass or hydronephrosis visualized. 2.0 cm simple parapelvic cyst is seen within the upper pole, best characterized as a Bosniak class 1 cyst. No follow-up imaging is recommended for this lesion. Bladder: Decompressed and obscured by overlying bowel gas Other: None. IMPRESSION: 1. Normal renal sonogram. Electronically Signed   By: Fidela Salisbury M.D.   On: 09/23/2022 15:48    [place] EKG SITE RITE  Result Date: 09/22/2022 If Site Rite image not attached, placement could not be confirmed due to current cardiac rhythm.    Scheduled Meds:  aspirin  81 mg Oral Daily   Chlorhexidine Gluconate Cloth  6 each Topical Daily   clopidogrel  75 mg Oral Daily   enoxaparin (LOVENOX) injection  40 mg Subcutaneous Q24H   fluticasone furoate-vilanterol  1 puff Inhalation Daily   insulin aspart  0-5 Units Subcutaneous QHS   insulin aspart  0-9 Units Subcutaneous TID WC   insulin aspart  2 Units Subcutaneous TID WC   insulin detemir  20 Units Subcutaneous Daily   pantoprazole  40 mg Oral Daily   rosuvastatin  20 mg Oral Daily   sodium chloride flush  10-40 mL Intracatheter Q12H   sodium chloride flush  3 mL Intravenous Q12H   sodium chloride flush  3 mL Intravenous Q12H   umeclidinium bromide  1 puff Inhalation Daily   Continuous Infusions:  sodium chloride       LOS: 4 days    Time spent: 59mn    PDomenic Polite MD [facility]   09/24/2022, 12:22 PM

## 2022-09-24 NOTE — Progress Notes (Signed)
   09/24/22 1452  Mobility  Activity Ambulated with assistance in hallway  Level of Assistance Standby assist, set-up cues, supervision of patient - no hands on  Assistive Device None  Distance Ambulated (ft) 500 ft  Activity Response Tolerated well  Mobility Referral Yes  $Mobility charge 1 Mobility   Mobility Specialist Progress Note  Pt was in bed and agreeable. X2 standing breaks d/t SOB. Returned to bed w/ all needs met and call bell in reach.   Lucious Groves Mobility Specialist  Please contact via SecureChat or Rehab office at [phone]

## 2022-09-24 NOTE — Progress Notes (Signed)
Nurse requested Mobility Specialist to perform oxygen saturation test with pt which includes removing pt from oxygen both at rest and while ambulating.  Below are the results from that testing.     Patient Saturations on Room Air at Rest = spO2 96%  Patient Saturations on Room Air while Ambulating = sp02 85% .  Rested and performed pursed lip breathing for 1 minute with sp02 at 87%.  Patient Saturations on 3 Liters of oxygen while Ambulating = sp02 92%  At end of testing pt left in room on 3  Liters of oxygen.  Reported results to nurse.

## 2022-09-24 NOTE — Plan of Care (Signed)
Problem: Education: Goal: Ability to describe self-care measures that may prevent or decrease complications (Diabetes Survival Skills Education) will improve 09/24/2022 1429 by Mamie Levers, RN Outcome: Progressing 09/24/2022 1429 by Mamie Levers, RN Outcome: Progressing Goal: Individualized Educational Video(s) 09/24/2022 1429 by Mamie Levers, RN Outcome: Progressing 09/24/2022 1429 by Mamie Levers, RN Outcome: Progressing   Problem: Coping: Goal: Ability to adjust to condition or change in health will improve 09/24/2022 1429 by Mamie Levers, RN Outcome: Progressing 09/24/2022 1429 by Mamie Levers, RN Outcome: Progressing   Problem: Fluid Volume: Goal: Ability to maintain a balanced intake and output will improve 09/24/2022 1429 by Mamie Levers, RN Outcome: Progressing 09/24/2022 1429 by Mamie Levers, RN Outcome: Progressing   Problem: Health Behavior/Discharge Planning: Goal: Ability to identify and utilize available resources and services will improve 09/24/2022 1429 by Mamie Levers, RN Outcome: Progressing 09/24/2022 1429 by Mamie Levers, RN Outcome: Progressing Goal: Ability to manage health-related needs will improve 09/24/2022 1429 by Mamie Levers, RN Outcome: Progressing 09/24/2022 1429 by Mamie Levers, RN Outcome: Progressing   Problem: Metabolic: Goal: Ability to maintain appropriate glucose levels will improve 09/24/2022 1429 by Mamie Levers, RN Outcome: Progressing 09/24/2022 1429 by Mamie Levers, RN Outcome: Progressing   Problem: Nutritional: Goal: Maintenance of adequate nutrition will improve 09/24/2022 1429 by Mamie Levers, RN Outcome: Progressing 09/24/2022 1429 by Mamie Levers, RN Outcome: Progressing Goal: Progress toward achieving an optimal weight will improve 09/24/2022 1429 by Mamie Levers, RN Outcome: Progressing 09/24/2022 1429 by Mamie Levers,  RN Outcome: Progressing   Problem: Skin Integrity: Goal: Risk for impaired skin integrity will decrease 09/24/2022 1429 by Mamie Levers, RN Outcome: Progressing 09/24/2022 1429 by Mamie Levers, RN Outcome: Progressing   Problem: Tissue Perfusion: Goal: Adequacy of tissue perfusion will improve 09/24/2022 1429 by Mamie Levers, RN Outcome: Progressing 09/24/2022 1429 by Mamie Levers, RN Outcome: Progressing   Problem: Education: Goal: Knowledge of General Education information will improve Description: Including pain rating scale, medication(s)/side effects and non-pharmacologic comfort measures 09/24/2022 1429 by Mamie Levers, RN Outcome: Progressing 09/24/2022 1429 by Mamie Levers, RN Outcome: Progressing   Problem: Health Behavior/Discharge Planning: Goal: Ability to manage health-related needs will improve 09/24/2022 1429 by Mamie Levers, RN Outcome: Progressing 09/24/2022 1429 by Mamie Levers, RN Outcome: Progressing   Problem: Clinical Measurements: Goal: Ability to maintain clinical measurements within normal limits will improve 09/24/2022 1429 by Mamie Levers, RN Outcome: Progressing 09/24/2022 1429 by Mamie Levers, RN Outcome: Progressing Goal: Will remain free from infection 09/24/2022 1429 by Mamie Levers, RN Outcome: Progressing 09/24/2022 1429 by Mamie Levers, RN Outcome: Progressing Goal: Diagnostic test results will improve 09/24/2022 1429 by Mamie Levers, RN Outcome: Progressing 09/24/2022 1429 by Mamie Levers, RN Outcome: Progressing Goal: Respiratory complications will improve 09/24/2022 1429 by Mamie Levers, RN Outcome: Progressing 09/24/2022 1429 by Mamie Levers, RN Outcome: Progressing Goal: Cardiovascular complication will be avoided 09/24/2022 1429 by Mamie Levers, RN Outcome: Progressing 09/24/2022 1429 by Mamie Levers, RN Outcome: Progressing    Problem: Activity: Goal: Risk for activity intolerance will decrease 09/24/2022 1429 by Mamie Levers, RN Outcome: Progressing 09/24/2022 1429 by Mamie Levers, RN Outcome: Progressing   Problem: Nutrition: Goal: Adequate nutrition will be maintained 09/24/2022 1429 by Mamie Levers, RN Outcome: Progressing 09/24/2022 1429 by Mamie Levers, RN Outcome: Progressing  Problem: Coping: Goal: Level of anxiety will decrease 09/24/2022 1429 by Mamie Levers, RN Outcome: Progressing 09/24/2022 1429 by Mamie Levers, RN Outcome: Progressing   Problem: Elimination: Goal: Will not experience complications related to bowel motility 09/24/2022 1429 by Mamie Levers, RN Outcome: Progressing 09/24/2022 1429 by Mamie Levers, RN Outcome: Progressing Goal: Will not experience complications related to urinary retention 09/24/2022 1429 by Mamie Levers, RN Outcome: Progressing 09/24/2022 1429 by Mamie Levers, RN Outcome: Progressing   Problem: Pain Managment: Goal: General experience of comfort will improve 09/24/2022 1429 by Mamie Levers, RN Outcome: Progressing 09/24/2022 1429 by Mamie Levers, RN Outcome: Progressing   Problem: Safety: Goal: Ability to remain free from injury will improve 09/24/2022 1429 by Mamie Levers, RN Outcome: Progressing 09/24/2022 1429 by Mamie Levers, RN Outcome: Progressing   Problem: Skin Integrity: Goal: Risk for impaired skin integrity will decrease 09/24/2022 1429 by Mamie Levers, RN Outcome: Progressing 09/24/2022 1429 by Mamie Levers, RN Outcome: Progressing   Problem: Education: Goal: Ability to demonstrate management of disease process will improve 09/24/2022 1429 by Mamie Levers, RN Outcome: Progressing 09/24/2022 1429 by Mamie Levers, RN Outcome: Progressing Goal: Ability to verbalize understanding of medication therapies will improve 09/24/2022 1429 by  Mamie Levers, RN Outcome: Progressing 09/24/2022 1429 by Mamie Levers, RN Outcome: Progressing Goal: Individualized Educational Video(s) 09/24/2022 1429 by Mamie Levers, RN Outcome: Progressing 09/24/2022 1429 by Mamie Levers, RN Outcome: Progressing   Problem: Activity: Goal: Capacity to carry out activities will improve 09/24/2022 1429 by Mamie Levers, RN Outcome: Progressing 09/24/2022 1429 by Mamie Levers, RN Outcome: Progressing   Problem: Cardiac: Goal: Ability to achieve and maintain adequate cardiopulmonary perfusion will improve 09/24/2022 1429 by Mamie Levers, RN Outcome: Progressing 09/24/2022 1429 by Mamie Levers, RN Outcome: Progressing   Problem: Education: Goal: Understanding of CV disease, CV risk reduction, and recovery process will improve 09/24/2022 1429 by Mamie Levers, RN Outcome: Progressing 09/24/2022 1429 by Mamie Levers, RN Outcome: Progressing Goal: Individualized Educational Video(s) 09/24/2022 1429 by Mamie Levers, RN Outcome: Progressing 09/24/2022 1429 by Mamie Levers, RN Outcome: Progressing   Problem: Activity: Goal: Ability to return to baseline activity level will improve 09/24/2022 1429 by Mamie Levers, RN Outcome: Progressing 09/24/2022 1429 by Mamie Levers, RN Outcome: Progressing   Problem: Cardiovascular: Goal: Ability to achieve and maintain adequate cardiovascular perfusion will improve 09/24/2022 1429 by Mamie Levers, RN Outcome: Progressing 09/24/2022 1429 by Mamie Levers, RN Outcome: Progressing Goal: Vascular access site(s) Level 0-1 will be maintained 09/24/2022 1429 by Mamie Levers, RN Outcome: Progressing 09/24/2022 1429 by Mamie Levers, RN Outcome: Progressing   Problem: Health Behavior/Discharge Planning: Goal: Ability to safely manage health-related needs after discharge will improve 09/24/2022 1429 by Mamie Levers,  RN Outcome: Progressing 09/24/2022 1429 by Mamie Levers, RN Outcome: Progressing

## 2022-09-24 NOTE — Progress Notes (Signed)
   09/24/22 0717  Vitals  Temp 97.8 F (36.6 C)  BP (!) 124/57  MAP (mmHg) 76  BP Location Left Arm  BP Method Automatic  Patient Position (if appropriate) Lying  Pulse Rate 66  Pulse Rate Source Monitor  ECG Heart Rate 69  Resp 18  Level of Consciousness  Level of Consciousness Alert  MEWS COLOR  MEWS Score Color Green  Oxygen Therapy  SpO2 90 %  MEWS Score  MEWS Temp 0  MEWS Systolic 0  MEWS Pulse 0  MEWS RR 0  MEWS LOC 0  MEWS Score 0

## 2022-09-24 NOTE — Progress Notes (Addendum)
Advanced Heart Failure Rounding Note  PCP-Cardiologist: Mertie Moores, MD   Subjective:   RHC 11/28  --> Diuretics held with low filling pressures.  RA = 8 RV = 48/10 PA = 45/20 (34) PCW = 13 Fick cardiac output/index = 6.0/3.0 Thermo CO/CI = 4.8/2.4  PVR = 3.3 WU (Fick) Ao sat = 90% PA sat = 55%, 58%    CO-OX 53%.   Feels better. Able to sleep last night. Denies chest pain.   Objective:   Weight Range: 77.5 kg Body mass index is 23.82 kg/m.   Vital Signs:   Temp:  [97.7 F (36.5 C)-98.6 F (37 C)] 97.8 F (36.6 C) (11/29 0717) Pulse Rate:  [56-85] 66 (11/29 0717) Resp:  [15-24] 18 (11/29 0717) BP: (110-140)/(49-70) 124/57 (11/29 0717) SpO2:  [84 %-100 %] 90 % (11/29 0717) Weight:  [77.5 kg] 77.5 kg (11/29 0518) Last BM Date : 09/23/22  Weight change: Filed Weights   09/22/22 0418 09/23/22 0439 09/24/22 0518  Weight: 83.1 kg 79.8 kg 77.5 kg    Intake/Output:   Intake/Output Summary (Last 24 hours) at 09/24/2022 0741 Last data filed at 09/24/2022 0523 Gross per 24 hour  Intake 837 ml  Output 3150 ml  Net -2313 ml     CVP 7-8  Physical Exam  General:  Sitting on the side of the bed. No resp difficulty HEENT: normal Neck: supple. no JVD. Carotids 2+ bilat; no bruits. No lymphadenopathy or thryomegaly appreciated. Cor: PMI nondisplaced. Regular rate & rhythm. No rubs, gallops or murmurs. Lungs: clear on 2 liters [place].  Abdomen: soft, nontender, nondistended. No hepatosplenomegaly. No bruits or masses. Good bowel sounds. Extremities: no cyanosis, clubbing, rash, edema.  Neuro: alert & orientedx3, cranial nerves grossly intact. moves all 4 extremities w/o difficulty. Affect pleasant  Telemetry   NSR with occasional PVCs 60-70s personally checked.   EKG    No new EKG to review  Labs    CBC Recent Labs    09/23/22 2000 09/24/22 0658  WBC 5.6 4.8  HGB 9.9* 9.4*  HCT 31.7* 30.7*  MCV 85.9 85.8  PLT 210 161   Basic Metabolic Panel Recent  Labs    09/23/22 0757 09/23/22 1651 09/23/22 2000 09/24/22 0658  NA 135 140  138  --  135  K 3.7 3.0*  3.3*  --  3.3*  CL 96*  --   --  96*  CO2 26  --   --  28  GLUCOSE 210*  --   --  212*  BUN 26*  --   --  37*  CREATININE 1.98*  --  2.70* 2.14*  CALCIUM 9.5  --   --  8.8*   Liver Function Tests Recent Labs    09/22/22 1824 09/24/22 0658  AST 17 18  ALT 14 13  ALKPHOS 58 63  BILITOT 0.9 0.9  PROT 7.0 6.3*  ALBUMIN 3.2* 2.9*   No results for input(s): "LIPASE", "AMYLASE" in the last 72 hours. Cardiac Enzymes No results for input(s): "CKTOTAL", "CKMB", "CKMBINDEX", "TROPONINI" in the last 72 hours.  BNP: BNP (last 3 results) Recent Labs    11/30/21 1853 09/20/22 1129  BNP 779.9* 1,408.9*    ProBNP (last 3 results) No results for input(s): "PROBNP" in the last 8760 hours.   D-Dimer No results for input(s): "DDIMER" in the last 72 hours.  Hemoglobin A1C No results for input(s): "HGBA1C" in the last 72 hours.  Fasting Lipid Panel No results for input(s): "CHOL", "HDL", "  [place]", "TRIG", "CHOLHDL", "LDLDIRECT" in the last 72 hours. Thyroid Function Tests No results for input(s): "TSH", "T4TOTAL", "T3FREE", "THYROIDAB" in the last 72 hours.  Invalid input(s): "FREET3"  Other results:   Imaging    CARDIAC CATHETERIZATION  Result Date: 09/23/2022 Findings: RA = 8 RV = 48/10 PA = 45/20 (34) PCW = 13 Fick cardiac output/index = 6.0/3.0 Thermo CO/CI = 4.8/2.4 PVR = 3.3 WU (Fick) Ao sat = 90% PA sat = 55%, 58% Assessment: 1. Mild PAH with normal left-sided filling pressures and normal output Plan/Discussion: Medical therapy. Hold diuretics. Likely needs home O2. Glori Bickers, MD 5:01 PM  US RENAL  Result Date: 09/23/2022 CLINICAL DATA:  Acute renal insufficiency EXAM: RENAL / URINARY TRACT ULTRASOUND COMPLETE COMPARISON:  CT 12/16/2018 FINDINGS: Right Kidney: Renal measurements: 9.0 x 4.5 x 3.4 cm = volume: 72 mL. Echogenicity within normal limits.  No mass or hydronephrosis visualized. Multiple simple exophytic cortical cysts are identified arising from the upper and lower pole, compatible with Bosniak class 1 cysts measuring up to 4.5 cm. No follow-up imaging is recommended for these lesions. Left Kidney: Renal measurements: 8.5 x 5.0 x 5.1 cm = volume: 116 mL. Echogenicity within normal limits. No mass or hydronephrosis visualized. 2.0 cm simple parapelvic cyst is seen within the upper pole, best characterized as a Bosniak class 1 cyst. No follow-up imaging is recommended for this lesion. Bladder: Decompressed and obscured by overlying bowel gas Other: None. IMPRESSION: 1. Normal renal sonogram. Electronically Signed   By: Fidela Salisbury M.D.   On: 09/23/2022 15:48     Medications:     Scheduled Medications:  aspirin  81 mg Oral Daily   Chlorhexidine Gluconate Cloth  6 each Topical Daily   clopidogrel  75 mg Oral Daily   enoxaparin (LOVENOX) injection  40 mg Subcutaneous Q24H   fluticasone furoate-vilanterol  1 puff Inhalation Daily   insulin aspart  0-5 Units Subcutaneous QHS   insulin aspart  0-9 Units Subcutaneous TID WC   insulin detemir  12 Units Subcutaneous Daily   pantoprazole  40 mg Oral Daily   rosuvastatin  20 mg Oral Daily   sodium chloride flush  10-40 mL Intracatheter Q12H   sodium chloride flush  3 mL Intravenous Q12H   sodium chloride flush  3 mL Intravenous Q12H   umeclidinium bromide  1 puff Inhalation Daily    Infusions:  sodium chloride      PRN Medications: sodium chloride, acetaminophen, albuterol, ondansetron **OR** ondansetron (ZOFRAN) IV, sodium chloride flush, sodium chloride flush    Patient Profile   [name] is a 76 y.o. male with CAD c/p CABG 3546, chronic diastolic CHF, chronic DOE, Rt CEA >72yr ago, Lt CEA 14', HTN, DM2, HLD, PAD w/ Rt SFA and Rt politeal artery vascular stents 15', prostate cancer. AHF team asked to see for acute on chronic combined systolic and diastolic CHF, previously  HFpEF now HFrEF, EF now 30-35%.    Assessment/Plan   Acute on chronic combined systolic and diastolic CHF, previously HFpEF now HFrEF - Echo 9/22: EF 60-65%, normal RV function, LA mildly elevated, trivial MR/TR, mild-mod aortic valve regurg - Echo EF 30-35%, mild concentric LVH, GIIDD, RV function mildly reduced, mild MR, mild to mod TR, mild aortic valve regurg - presented NYHA class IV with SOB, orthopnea and BLE edema - volume overloaded on admission suspect low output 2/2 ischemia?, COPD?, recently treated PNA?, PVCs, Cr peaked at 2.7. --> today 2.1 - Overall diuresed 13 pounds.  Had RHC with low filling pressures and preserved cardiac output.  - Hold diuretics.  - hold BB with acute exacerbation - Hold off on arni, mra, sglt2i with AKI and low filling pressures .2CAD s/p CABG  - HsTrop 79>61, suspect demand ischemia with volume overload - No chest pain, may need LHC once renal function improves -Continue Plavix, ASA, and statin  AKI on CKD st 3a - likely cardiorenal, baseline Cr. 1 - Cr  peaked at 2.7. today 2.14.  Hold diuretics.  - Avoid hypotension Anemia - Tsat 9, ferritin 26, will need feraheme closer to d/c PAD - s/p intervention 15' Rt SFA and Rt politeal artery vascular stents - Continue plavix HTN - Stable.   DM - Continue SSI, Hgb A1c last 7.5 - per primary COPD - has [place] new patient appointment scheduled for 12/8 - Continue nebs  - recently treated for PNA 9. Hypokalemia  Supp K  Check BMET    Length of Stay: 4  [name] Clegg NP-C  09/24/2022, 7:41 AM  Advanced Heart Failure Team Pager [phone] (M-F; 7a - 5p)  Please contact [facility] for night-coverage after hours (5p -7a ) and weekends on [email or web address]  Patient seen and examined with the above-signed Advanced Practice Provider and/or Housestaff. I personally reviewed laboratory data, imaging studies and relevant notes. I independently examined the patient and formulated the important aspects of  the plan. I have edited the note to reflect any of my changes or salient points. I have personally discussed the plan with the patient and/or family.  Feels better today. No CP. SOB improved though sats still running 89-92% at rest. RHC numbers reviewed. Scr improving slowly.   General:  Lying in bed No resp difficulty HEENT: normal Neck: supple. no JVD. Carotids 2+ bilat; no bruits. No lymphadenopathy or thryomegaly appreciated. Cor: PMI nondisplaced. Regular rate & rhythm. No rubs, gallops or murmurs. Lungs: decreased throughout  Abdomen: soft, nontender, nondistended. No hepatosplenomegaly. No bruits or masses. Good bowel sounds. Extremities: no cyanosis, clubbing, rash, edema Neuro: alert & orientedx3, cranial nerves grossly intact. moves all 4 extremities w/o difficulty. Affect pleasant  Continue to hold diuretics. Likely can restart tomorrow. Assess for need for home O2 with hall walk. Would continue medical therapy for now. If EF does not improve on f/u echo can consider outpatient coronary/CABG angio.  Glori Bickers, MD  2:08 PM

## 2022-09-25 ENCOUNTER — Other Ambulatory Visit (HOSPITAL_COMMUNITY): Payer: Self-pay

## 2022-09-25 DIAGNOSIS — J9601 Acute respiratory failure with hypoxia: Secondary | ICD-10-CM | POA: Diagnosis not present

## 2022-09-25 DIAGNOSIS — I5033 Acute on chronic diastolic (congestive) heart failure: Secondary | ICD-10-CM | POA: Diagnosis not present

## 2022-09-25 LAB — COOXEMETRY PANEL
Carboxyhemoglobin: 1.6 % — ABNORMAL HIGH (ref 0.5–1.5)
Carboxyhemoglobin: 1.4 % (ref 0.5–1.5)
Methemoglobin: 0.8 % (ref 0.0–1.5)
Methemoglobin: 1.4 % (ref 0.0–1.5)
O2 Saturation: 50.8 %
O2 Saturation: 54.9 %
Total hemoglobin: 10.1 g/dL — ABNORMAL LOW (ref 12.0–16.0)
Total hemoglobin: 9.8 g/dL — ABNORMAL LOW (ref 12.0–16.0)

## 2022-09-25 LAB — BASIC METABOLIC PANEL
Anion gap: 9 (ref 5–15)
BUN: 31 mg/dL — ABNORMAL HIGH (ref 8–23)
CO2: 28 mmol/L (ref 22–32)
Calcium: 9.2 mg/dL (ref 8.9–10.3)
Chloride: 100 mmol/L (ref 98–111)
Creatinine, Ser: 1.7 mg/dL — ABNORMAL HIGH (ref 0.61–1.24)
GFR, Estimated: 42 mL/min — ABNORMAL LOW (ref >60)
Glucose, Bld: 194 mg/dL — ABNORMAL HIGH (ref 70–99)
Potassium: 4.2 mmol/L (ref 3.5–5.1)
Sodium: 137 mmol/L (ref 135–145)

## 2022-09-25 LAB — GLUCOSE, CAPILLARY
Glucose-Capillary: 181 mg/dL — ABNORMAL HIGH (ref 70–99)
Glucose-Capillary: 349 mg/dL — ABNORMAL HIGH (ref 70–99)
Glucose-Capillary: 114 mg/dL — ABNORMAL HIGH (ref 70–99)

## 2022-09-25 LAB — LIPOPROTEIN A (LPA): Lipoprotein (a): 47.5 nmol/L — ABNORMAL HIGH (ref <75.0)

## 2022-09-25 MED ORDER — FUROSEMIDE 20 MG PO TABS
60.0000 mg | ORAL_TABLET | Freq: Every day | ORAL | 0 refills | Status: DC
  Filled 2022-09-25: qty 90, 30d supply, fill #0

## 2022-09-25 MED ORDER — DAPAGLIFLOZIN PROPANEDIOL 10 MG PO TABS
10.0000 mg | ORAL_TABLET | Freq: Every day | ORAL | Status: DC
  Administered 2022-09-25: 10 mg via ORAL
  Filled 2022-09-25: qty 1

## 2022-09-25 MED ORDER — DAPAGLIFLOZIN PROPANEDIOL 10 MG PO TABS
10.0000 mg | ORAL_TABLET | Freq: Every day | ORAL | 0 refills | Status: DC
  Filled 2022-09-25: qty 30, 30d supply, fill #0

## 2022-09-25 MED ORDER — FUROSEMIDE 40 MG PO TABS: 60.0000 mg | ORAL_TABLET | Freq: Every day | ORAL | Status: DC

## 2022-09-25 MED ORDER — FUROSEMIDE 40 MG PO TABS
ORAL_TABLET | ORAL | 0 refills | Status: DC
  Filled 2022-09-25: qty 30, 30d supply, fill #0

## 2022-09-25 NOTE — Discharge Instructions (Signed)
Follow with [name], [name], MD in 5-7 days  Please get a complete blood count and chemistry panel checked by your Primary MD at your next visit, and again as instructed by your Primary MD. Please get your medications reviewed and adjusted by your Primary MD.  Please request your Primary MD to go over all Hospital Tests and Procedure/Radiological results at the follow up, please get all Hospital records sent to your Prim MD by signing hospital release before you go home.  In some cases, there will be blood work, cultures and biopsy results pending at the time of your discharge. Please request that your primary care M.D. goes through all the records of your hospital data and follows up on these results.  If you had Pneumonia of Lung problems at the Hospital: Please get a 2 view Chest X ray done in 6-8 weeks after hospital discharge or sooner if instructed by your Primary MD.  If you have Congestive Heart Failure: Please call your Cardiologist or Primary MD anytime you have any of the following symptoms:  1) 3 pound weight gain in 24 hours or 5 pounds in 1 week  2) shortness of breath, with or without a dry hacking cough  3) swelling in the hands, feet or stomach  4) if you have to sleep on extra pillows at night in order to breathe  Follow cardiac low salt diet and 1.5 lit/day fluid restriction.  If you have diabetes Accuchecks 4 times/day, Once in AM empty stomach and then before each meal. Log in all results and show them to your primary doctor at your next visit. If any glucose reading is under 80 or above 300 call your primary MD immediately.  If you have Seizure/Convulsions/Epilepsy: Please do not drive, operate heavy machinery, participate in activities at heights or participate in high speed sports until you have seen by Primary MD or a Neurologist and advised to do so again. Per [facility] statutes, patients with seizures are not allowed to drive until they have been  seizure-free for six months.  Use caution when using heavy equipment or power tools. Avoid working on ladders or at heights. Take showers instead of baths. Ensure the water temperature is not too high on the home water heater. Do not go swimming alone. Do not lock yourself in a room alone (i.e. bathroom). When caring for infants or small children, sit down when holding, feeding, or changing them to minimize risk of injury to the child in the event you have a seizure. Maintain good sleep hygiene. Avoid alcohol.   If you had Gastrointestinal Bleeding: Please ask your Primary MD to check a complete blood count within one week of discharge or at your next visit. Your endoscopic/colonoscopic biopsies that are pending at the time of discharge, will also need to followed by your Primary MD.  Get Medicines reviewed and adjusted. Please take all your medications with you for your next visit with your Primary MD  Please request your Primary MD to go over all hospital tests and procedure/radiological results at the follow up, please ask your Primary MD to get all Hospital records sent to his/her office.  If you experience worsening of your admission symptoms, develop shortness of breath, life threatening emergency, suicidal or homicidal thoughts you must seek medical attention immediately by calling 911 or calling your MD immediately  if symptoms less severe.  You must read complete instructions/literature along with all the possible adverse reactions/side effects for all the Medicines you  take and that have been prescribed to you. Take any new Medicines after you have completely understood and accpet all the possible adverse reactions/side effects.   Do not drive or operate heavy machinery when taking Pain medications.   Do not take more than prescribed Pain, Sleep and Anxiety Medications  Special Instructions: If you have smoked or chewed Tobacco  in the last 2 yrs please stop smoking, stop any regular  Alcohol  and or any Recreational drug use.  Wear Seat belts while driving.  Please note You were cared for by a hospitalist during your hospital stay. If you have any questions about your discharge medications or the care you received while you were in the hospital after you are discharged, you can call the unit and asked to speak with the hospitalist on call if the hospitalist that took care of you is not available. Once you are discharged, your primary care physician will handle any further medical issues. Please note that NO REFILLS for any discharge medications will be authorized once you are discharged, as it is imperative that you return to your primary care physician (or establish a relationship with a primary care physician if you do not have one) for your aftercare needs so that they can reassess your need for medications and monitor your lab values.  You can reach the hospitalist office at phone [phone] or fax [phone]   If you do not have a primary care physician, you can call [phone] for a physician referral.  Activity: As tolerated with Full fall precautions use walker/cane & assistance as needed    Diet: low sodium heart healthy  Disposition Home

## 2022-09-25 NOTE — Discharge Summary (Signed)
Physician Discharge Summary  [name] JQB:[id number] DOB: 17-May-1946 DOA: 09/20/2022  PCP: Haywood Pao, MD  Admit date: 09/20/2022 Discharge date: 09/25/2022  Admitted From: home Disposition:  home  Recommendations for Outpatient Follow-up:  Follow up with PCP in 1-2 weeks Repeat a BMP in 1 week  Home Health: none Equipment/Devices: oxygen  Discharge Condition: stable CODE STATUS: Full code Diet Orders (From admission, onward)     Start     Ordered   09/23/22 1930  Diet Heart Room service appropriate? Yes; Fluid consistency: Thin  Diet effective now       Question Answer Comment  Room service appropriate? Yes   Fluid consistency: Thin      09/23/22 1929            HPI: Per admitting MD, [name] is a 76 y.o. male with medical history significant of  h/o CAD s/p CABG, PAD s/p vascular intervention right leg 2015,, DM, HTN, HLD, CHFpef with chronic DOE,and prostate CA presenting with SOB and edema, O2 sat 87% on RA.  Per patient no dietary indiscretion, also denies noncompliance with medications. He notes symptoms on going for 2 days worse over the last 24 hours. He notes no cough , fever/ chills/ n/v/d/ or abdominal pain. He does note increase swelling in lower extremity edema progressive over last 48 hours. He also denies sick contacts.   Hospital Course / Discharge diagnoses: Principal problem Acute hypoxic resp failure due to acute systolic and diastolic heart failure  -last ECHo 9/22 w/ normal EF, grade 2 DD, normal RV, mod AI, repeat echo with EF down to 30-35% and regional wall motion abnormalities.  Cardiology consulted and evaluated patient while hospitalized.  He was initially placed on IV furosemide, diuresed 8.5 L however his diuretics had to be held due to rising creatinine.  Diuretics were discontinued, creatinine now improving.  Discussed with cardiology, he appears euvolemic now, will be discharged home in stable condition and he is to resume  his oral diuretics tomorrow.  Based on ambulatory sats, patient will need oxygen on discharge.   Active problems  CAD s/p CABG 2003 -continue plavix/BB. Echo with worsening cardiomyopathy and regional WMA. Needs left heart cath as an outpatient if kidney function improves AKI on CKD3a -Baseline creatinine around 1.3-1.4, worsened to 2.7 in the setting of diuretics, now improved and is 1.7 this morning. Right heart cath with normal filling pressures, diuretics per cardiology, preserved cardiac output.  Resume home regimen tomorrow  PAD -s/p vascular intervention right leg 2015, continue plavix Hypertension -Hold Coreg and hydralazine on discharge, resume all other meds per cardiology Prostate CA -continue to follow with urology  Normocytic anemia -Anemia panel with iron deficiency, given IV iron DM -A1c 7.5 which is acceptable in his age group.  Continue home medications  Sepsis ruled out   Discharge Instructions   Allergies as of 09/25/2022       Reactions   Aldactone [spironolactone] Other (See Comments)   Swollen breast   Lipitor [atorvastatin Calcium] Other (See Comments)   Muscle ache   Sulfa Drugs Cross Reactors Other (See Comments)   "raw spots"   Sulfamethoxazole-trimethoprim    Other reaction(s): Rash   Codeine Rash        Medication List     STOP taking these medications    carvedilol 6.25 MG tablet Commonly known as: COREG   hydrALAZINE 50 MG tablet Commonly known as: APRESOLINE       TAKE these medications  albuterol 108 (90 Base) MCG/ACT inhaler Commonly known as: VENTOLIN HFA Inhale 2 puffs into the lungs every 6 (six) hours as needed for wheezing or shortness of breath.   amLODipine-benazepril 10-40 MG capsule Commonly known as: LOTREL Take 1 capsule by mouth once daily   aspirin EC 81 MG tablet Take 81 mg by mouth daily.   clopidogrel 75 MG tablet Commonly known as: PLAVIX Take 1 tablet by mouth once daily   dapagliflozin propanediol 10  MG Tabs tablet Commonly known as: FARXIGA Take 1 tablet (10 mg total) by mouth daily. Start taking on: September 26, 2022   eplerenone 50 MG tablet Commonly known as: INSPRA Take 1 tablet (50 mg total) by mouth daily.   furosemide 40 MG tablet Commonly known as: LASIX Take 1 tablet by mouth only as needed for swelling, along with the 60 mg tablets. What changed:  medication strength additional instructions   furosemide 20 MG tablet Commonly known as: LASIX Take 3 tablets (60 mg total) by mouth daily. Start taking on: September 26, 2022 What changed:  medication strength how much to take   insulin NPH Human 100 UNIT/ML injection Commonly known as: NOVOLIN N Inject 35-40 Units into the skin See admin instructions. Inject 40 units into the skin in the morning and 35 units at night   insulin regular 100 units/mL injection Commonly known as: NOVOLIN R Inject 6-15 Units into the skin 3 (three) times daily before meals. Per sliding scale   metFORMIN 500 MG 24 hr tablet Commonly known as: GLUCOPHAGE-XR Take 500 mg by mouth 2 (two) times daily with a meal.   nitroGLYCERIN 0.4 MG SL tablet Commonly known as: NITROSTAT Place 1 tablet (0.4 mg total) under the tongue every 5 (five) minutes as needed for chest pain.   pantoprazole 20 MG tablet Commonly known as: PROTONIX Take 1 tablet by mouth once daily   rosuvastatin 20 MG tablet Commonly known as: CRESTOR Take 1 tablet by mouth once daily   Vitamin D-3 125 MCG (5000 UT) Tabs Take 5,000 Units by mouth daily.               Durable Medical Equipment  (From admission, onward)           Start     Ordered   09/25/22 1327  For home use only DME oxygen  Once       Question Answer Comment  Length of Need Lifetime   Mode or (Route) Nasal cannula   Liters per Minute 3   Frequency Continuous (stationary and portable oxygen unit needed)   Oxygen delivery system Gas      09/25/22 1326   09/25/22 1258  For home use only  DME oxygen  Once       Comments: POC-RT evaluate. Breathe Right Program  Question Answer Comment  Length of Need Lifetime   Mode or (Route) Nasal cannula   Liters per Minute 3   Frequency Continuous (stationary and portable oxygen unit needed)   Oxygen delivery system Gas      09/25/22 1257            Follow-up Information     [facility] Follow up on 10/07/2022.   Specialty: Cardiology Why: [facility] 10 am Entrance C, Free Valet Parking Please bring all meds to appointment Contact information: [address] [id number] [place] [phone]  Consultations: Cardiology  Procedures/Studies:  CARDIAC CATHETERIZATION  Result Date: 09/23/2022 Findings: RA = 8 RV = 48/10 PA = 45/20 (34) PCW = 13 Fick cardiac output/index = 6.0/3.0 Thermo CO/CI = 4.8/2.4 PVR = 3.3 WU (Fick) Ao sat = 90% PA sat = 55%, 58% Assessment: 1. Mild PAH with normal left-sided filling pressures and normal output Plan/Discussion: Medical therapy. Hold diuretics. Likely needs home O2. Glori Bickers, MD 5:01 PM  US RENAL  Result Date: 09/23/2022 CLINICAL DATA:  Acute renal insufficiency EXAM: RENAL / URINARY TRACT ULTRASOUND COMPLETE COMPARISON:  CT 12/16/2018 FINDINGS: Right Kidney: Renal measurements: 9.0 x 4.5 x 3.4 cm = volume: 72 mL. Echogenicity within normal limits. No mass or hydronephrosis visualized. Multiple simple exophytic cortical cysts are identified arising from the upper and lower pole, compatible with Bosniak class 1 cysts measuring up to 4.5 cm. No follow-up imaging is recommended for these lesions. Left Kidney: Renal measurements: 8.5 x 5.0 x 5.1 cm = volume: 116 mL. Echogenicity within normal limits. No mass or hydronephrosis visualized. 2.0 cm simple parapelvic cyst is seen within the upper pole, best characterized as a Bosniak class 1 cyst. No follow-up imaging is  recommended for this lesion. Bladder: Decompressed and obscured by overlying bowel gas Other: None. IMPRESSION: 1. Normal renal sonogram. Electronically Signed   By: Fidela Salisbury M.D.   On: 09/23/2022 15:48   [place] EKG SITE RITE  Result Date: 09/22/2022 If Site Rite image not attached, placement could not be confirmed due to current cardiac rhythm.  ECHOCARDIOGRAM COMPLETE  Result Date: 09/21/2022    ECHOCARDIOGRAM REPORT   Patient Name:   [name] Date of Exam: 09/21/2022 Medical Rec #:  [id number]     Height:       71.0 in Accession #:    [id number]    Weight:       183.7 lb Date of Birth:  Jan 01, 1946     BSA:          2.034 m Patient Age:    44 years      BP:           132/62 mmHg Patient Gender: M             HR:           67 bpm. Exam Location:  Inpatient Procedure: 2D Echo and Intracardiac Opacification Agent Indications:    CHF  History:        Patient has prior history of Echocardiogram examinations, most                 recent 07/03/2021. CAD; Risk Factors:Hypertension, Diabetes and                 Dyslipidemia.  Sonographer:    Harvie Junior Referring Phys: [place]  Sonographer Comments: Technically difficult study due to poor echo windows. IMPRESSIONS  1. Left ventricular ejection fraction, by estimation, is 30 to 35%. The left ventricle has moderately decreased function. The left ventricle demonstrates regional wall motion abnormalities (see scoring diagram/findings for description). There is mild concentric left ventricular hypertrophy. Left ventricular diastolic parameters are consistent with Grade II diastolic dysfunction (pseudonormalization). There is the interventricular septum is flattened in systole and diastole, consistent with right ventricular pressure and volume overload.  2. Right ventricular systolic function is mildly reduced. The right ventricular size is normal. There is mildly elevated pulmonary artery systolic pressure. The estimated right ventricular systolic  pressure is 76.1 mmHg.  3. Moderate pleural  effusion in the left lateral region.  4. The mitral valve is grossly normal. Mild mitral valve regurgitation.  5. Tricuspid valve regurgitation is mild to moderate.  6. The aortic valve is tricuspid. There is moderate calcification of the aortic valve. Aortic valve regurgitation is mild. Aortic regurgitation PHT measures 709 msec. Aortic valve mean gradient measures 3.5 mmHg.  7. The inferior vena cava is normal in size with <50% respiratory variability, suggesting right atrial pressure of 8 mmHg. Comparison(s): Prior images reviewed side by side. LVEF has decreased in comparison with progressive wall motion abnormalities. FINDINGS  Left Ventricle: Left ventricular ejection fraction, by estimation, is 30 to 35%. The left ventricle has moderately decreased function. The left ventricle demonstrates regional wall motion abnormalities. Definity contrast agent was given IV to delineate the left ventricular endocardial borders. The left ventricular internal cavity size was normal in size. There is mild concentric left ventricular hypertrophy. The interventricular septum is flattened in systole and diastole, consistent with right ventricular pressure and volume overload. Left ventricular diastolic parameters are consistent with Grade II diastolic dysfunction (pseudonormalization).  LV Wall Scoring: The inferior wall, posterior wall, mid inferoseptal segment, basal inferoseptal segment, and apex are akinetic. The entire anterior septum and apical inferior segment are hypokinetic. The entire anterior wall, antero-lateral wall, and apical lateral segment are normal. Right Ventricle: The right ventricular size is normal. No increase in right ventricular wall thickness. Right ventricular systolic function is mildly reduced. There is mildly elevated pulmonary artery systolic pressure. The tricuspid regurgitant velocity  is 2.93 m/s, and with an assumed right atrial pressure of 8 mmHg,  the estimated right ventricular systolic pressure is 35.3 mmHg. Left Atrium: Left atrial size was normal in size. Right Atrium: Right atrial size was normal in size. Pericardium: There is no evidence of pericardial effusion. Mitral Valve: The mitral valve is grossly normal. Mild mitral annular calcification. Mild mitral valve regurgitation. Tricuspid Valve: The tricuspid valve is grossly normal. Tricuspid valve regurgitation is mild to moderate. Aortic Valve: The aortic valve is tricuspid. There is moderate calcification of the aortic valve. There is mild aortic valve annular calcification. Aortic valve regurgitation is mild. Aortic regurgitation PHT measures 709 msec. Aortic valve mean gradient  measures 3.5 mmHg. Aortic valve peak gradient measures 8.4 mmHg. Aortic valve area, by VTI measures 1.95 cm. Pulmonic Valve: The pulmonic valve was grossly normal. Pulmonic valve regurgitation is trivial. Aorta: The aortic root is normal in size and structure. Venous: The inferior vena cava is normal in size with less than 50% respiratory variability, suggesting right atrial pressure of 8 mmHg. IAS/Shunts: No atrial level shunt detected by color flow Doppler. Additional Comments: There is a moderate pleural effusion in the left lateral region.  LEFT VENTRICLE PLAX 2D LVIDd:         5.30 cm      Diastology LVIDs:         4.30 cm      LV e' medial:    4.46 cm/s LV PW:         1.10 cm      LV E/e' medial:  30.9 LV IVS:        1.20 cm      LV e' lateral:   8.92 cm/s LVOT diam:     1.80 cm      LV E/e' lateral: 15.5 LV SV:         56 LV SV Index:   27 LVOT Area:     2.54 cm  LV Volumes (MOD) LV vol d, MOD A2C: 196.0 ml LV vol d, MOD A4C: 241.0 ml LV vol s, MOD A2C: 171.0 ml LV vol s, MOD A4C: 152.0 ml LV SV MOD A2C:     25.0 ml LV SV MOD A4C:     241.0 ml LV SV MOD BP:      64.2 ml RIGHT VENTRICLE RV Basal diam:  3.80 cm RV Mid diam:    3.40 cm RV S prime:     5.44 cm/s TAPSE (M-mode): 1.3 cm LEFT ATRIUM             Index         RIGHT ATRIUM           Index LA diam:        4.20 cm 2.07 cm/m   RA Area:     15.20 cm LA Vol (A2C):   54.3 ml 26.70 ml/m  RA Volume:   40.30 ml  19.82 ml/m LA Vol (A4C):   59.9 ml 29.45 ml/m LA Biplane Vol: 59.4 ml 29.21 ml/m  AORTIC VALVE                    PULMONIC VALVE AV Area (Vmax):    1.76 cm     PV Vmax:       1.26 m/s AV Area (Vmean):   1.86 cm     PV Peak grad:  6.4 mmHg AV Area (VTI):     1.95 cm AV Vmax:           144.50 cm/s AV Vmean:          83.750 cm/s AV VTI:            0.286 m AV Peak Grad:      8.4 mmHg AV Mean Grad:      3.5 mmHg LVOT Vmax:         99.80 cm/s LVOT Vmean:        61.200 cm/s LVOT VTI:          0.219 m LVOT/AV VTI ratio: 0.77 AI PHT:            709 msec  AORTA Ao Root diam: 3.00 cm MITRAL VALVE                TRICUSPID VALVE MV Area (PHT): 4.39 cm     TR Peak grad:   34.3 mmHg MV Decel Time: 173 msec     TR Vmax:        293.00 cm/s MR Peak grad: 26.1 mmHg MR Vmax:      255.33 cm/s   SHUNTS MV E velocity: 138.00 cm/s  Systemic VTI:  0.22 m MV A velocity: 55.60 cm/s   Systemic Diam: 1.80 cm MV E/A ratio:  2.48 Rozann Lesches MD Electronically signed by Rozann Lesches MD Signature Date/Time: 09/21/2022/3:26:12 PM    Final    DG Chest 2 View  Result Date: 09/20/2022 CLINICAL DATA:  Shortness of breath.  CHF. EXAM: CHEST - 2 VIEW COMPARISON:  Chest x-ray November 30, 2021 FINDINGS: Stable cardiomegaly. Sternotomy wires are intact. The hila and mediastinum are unremarkable. No pneumothorax. No nodules or masses. Bilateral pulmonary opacities appear to be primarily interstitial bilateral pleural effusions. IMPRESSION: Findings are most consistent with cardiomegaly, small effusions, and mild pulmonary edema. Electronically Signed   By: Dorise Bullion III M.D.   On: 09/20/2022 12:54     Subjective: - no chest pain, shortness of breath, no abdominal  pain, nausea or vomiting.   Discharge Exam: BP (!) 118/59 (BP Location: Left Arm)   Pulse (!) 59   Temp 97.9 F  (36.6 C) (Oral)   Resp 18   Ht '5\' 11"'$  (1.803 m)   Wt 78 kg   SpO2 97%   BMI 23.99 kg/m   General: Pt is alert, awake, not in acute distress Cardiovascular: RRR, S1/S2 +, no rubs, no gallops Respiratory: CTA bilaterally, no wheezing, no rhonchi Abdominal: Soft, NT, ND, bowel sounds + Extremities: no edema, no cyanosis   The results of significant diagnostics from this hospitalization (including imaging, microbiology, ancillary and laboratory) are listed below for reference.     Microbiology: Recent Results (from the past 240 hour(s))  Resp Panel by RT-PCR (Flu A&B, Covid) Anterior Nasal Swab     Status: None   Collection Time: 09/20/22  1:54 PM   Specimen: Anterior Nasal Swab  Result Value Ref Range Status   SARS Coronavirus 2 by RT PCR NEGATIVE NEGATIVE Final    Comment: (NOTE) SARS-CoV-2 target nucleic acids are NOT DETECTED.  The SARS-CoV-2 RNA is generally detectable in upper respiratory specimens during the acute phase of infection. The lowest concentration of SARS-CoV-2 viral copies this assay can detect is 138 copies/mL. A negative result does not preclude SARS-Cov-2 infection and should not be used as the sole basis for treatment or other patient management decisions. A negative result may occur with  improper specimen collection/handling, submission of specimen other than nasopharyngeal swab, presence of viral mutation(s) within the areas targeted by this assay, and inadequate number of viral copies(<138 copies/mL). A negative result must be combined with clinical observations, patient history, and epidemiological information. The expected result is Negative.  Fact Sheet for Patients:  EntrepreneurPulse.com.au  Fact Sheet for Healthcare Providers:  IncredibleEmployment.be  This test is no t yet approved or cleared by the [place] FDA and  has been authorized for detection and/or diagnosis of SARS-CoV-2 by FDA under an  Emergency Use Authorization (EUA). This EUA will remain  in effect (meaning this test can be used) for the duration of the COVID-19 declaration under Section 564(b)(1) of the Act, 21 U.S.C.section 360bbb-3(b)(1), unless the authorization is terminated  or revoked sooner.       Influenza A by PCR NEGATIVE NEGATIVE Final   Influenza B by PCR NEGATIVE NEGATIVE Final    Comment: (NOTE) The Xpert Xpress SARS-CoV-2/FLU/RSV plus assay is intended as an aid in the diagnosis of influenza from Nasopharyngeal swab specimens and should not be used as a sole basis for treatment. Nasal washings and aspirates are unacceptable for Xpert Xpress SARS-CoV-2/FLU/RSV testing.  Fact Sheet for Patients: EntrepreneurPulse.com.au  Fact Sheet for Healthcare Providers: IncredibleEmployment.be  This test is not yet approved or cleared by the [place] FDA and has been authorized for detection and/or diagnosis of SARS-CoV-2 by FDA under an Emergency Use Authorization (EUA). This EUA will remain in effect (meaning this test can be used) for the duration of the COVID-19 declaration under Section 564(b)(1) of the Act, 21 U.S.C. section 360bbb-3(b)(1), unless the authorization is terminated or revoked.  Performed at [facility], [address] [place], [place] [address]   Respiratory (~20 pathogens) panel by PCR     Status: None   Collection Time: 09/21/22  8:35 AM   Specimen: Nasopharyngeal Swab; Respiratory  Result Value Ref Range Status   Adenovirus NOT DETECTED NOT DETECTED Final   Coronavirus 229E NOT DETECTED NOT DETECTED Final    Comment: (  NOTE) The Coronavirus on the Respiratory Panel, DOES NOT test for the novel  Coronavirus (2019 nCoV)    Coronavirus HKU1 NOT DETECTED NOT DETECTED Final   Coronavirus NL63 NOT DETECTED NOT DETECTED Final   Coronavirus OC43 NOT DETECTED NOT DETECTED Final   Metapneumovirus NOT DETECTED NOT DETECTED  Final   Rhinovirus / Enterovirus NOT DETECTED NOT DETECTED Final   Influenza A NOT DETECTED NOT DETECTED Final   Influenza B NOT DETECTED NOT DETECTED Final   Parainfluenza Virus 1 NOT DETECTED NOT DETECTED Final   Parainfluenza Virus 2 NOT DETECTED NOT DETECTED Final   Parainfluenza Virus 3 NOT DETECTED NOT DETECTED Final   Parainfluenza Virus 4 NOT DETECTED NOT DETECTED Final   Respiratory Syncytial Virus NOT DETECTED NOT DETECTED Final   Bordetella pertussis NOT DETECTED NOT DETECTED Final   Bordetella Parapertussis NOT DETECTED NOT DETECTED Final   Chlamydophila pneumoniae NOT DETECTED NOT DETECTED Final   Mycoplasma pneumoniae NOT DETECTED NOT DETECTED Final    Comment: Performed at [facility], [place] [address], [place], [place] [address]     Labs: Basic Metabolic Panel: Recent Labs  Lab 09/20/22 1129 09/20/22 2235 09/21/22 0008 09/22/22 0044 09/23/22 0757 09/23/22 1651 09/23/22 2000 09/24/22 0658 09/25/22 0500  NA 143 143 142 140 135 140  138  --  135 137  K 2.7* 3.1* 2.9* 3.7 3.7 3.0*  3.3*  --  3.3* 4.2  CL 104 109 110 108 96*  --   --  96* 100  CO2 '25 22 22 '$ 19* 26  --   --  28 28  GLUCOSE 108* 199* 189* 173* 210*  --   --  212* 194*  BUN '21 18 18 22 '$ 26*  --   --  37* 31*  CREATININE 1.47* 1.59* 1.57* 1.77* 1.98*  --  2.70* 2.14* 1.70*  CALCIUM 8.8* 8.5* 8.3* 8.9 9.5  --   --  8.8* 9.2  MG 2.5* 2.4  --   --   --   --   --   --   --   PHOS  --  2.4*  --   --   --   --   --   --   --    Liver Function Tests: Recent Labs  Lab 09/21/22 0008 09/22/22 1824 09/24/22 0658  AST '16 17 18  '$ ALT '12 14 13  '$ ALKPHOS 53 58 63  BILITOT 0.5 0.9 0.9  PROT 5.9* 7.0 6.3*  ALBUMIN 2.7* 3.2* 2.9*   CBC: Recent Labs  Lab 09/20/22 1129 09/20/22 2235 09/21/22 0008 09/22/22 0044 09/23/22 1651 09/23/22 2000 09/24/22 0658  WBC 5.4 4.8 5.0 6.9  --  5.6 4.8  NEUTROABS 3.9  --   --   --   --   --   --   HGB 10.3* 9.9* 9.2* 9.8* 9.9*  10.5* 9.9* 9.4*  HCT 33.7* 32.0*  30.4* 31.9* 29.0*  31.0* 31.7* 30.7*  MCV 86.9 87.9 88.4 87.2  --  85.9 85.8  PLT 244 198 196 220  --  210 185   CBG: Recent Labs  Lab 09/24/22 1054 09/24/22 1608 09/24/22 2058 09/25/22 0600 09/25/22 1101  GLUCAP 260* 309* 222* 181* 349*   Hgb A1c No results for input(s): "HGBA1C" in the last 72 hours. Lipid Profile No results for input(s): "CHOL", "HDL", "LDLCALC", "TRIG", "CHOLHDL", "LDLDIRECT" in the last 72 hours. Thyroid function studies No results for input(s): "TSH", "T4TOTAL", "T3FREE", "THYROIDAB" in the last 72 hours.  Invalid input(s): "FREET3" Urinalysis    Component Value Date/Time   COLORURINE YELLOW 07/19/2013 [place] 07/19/2013 1223   LABSPEC 1.013 07/19/2013 1223   PHURINE 5.5 07/19/2013 1223   GLUCOSEU 500 (A) 07/19/2013 1223   HGBUR NEGATIVE 07/19/2013 1223   BILIRUBINUR NEGATIVE 07/19/2013 1223   KETONESUR NEGATIVE 07/19/2013 1223   PROTEINUR NEGATIVE 07/19/2013 1223   UROBILINOGEN 1.0 07/19/2013 1223   NITRITE NEGATIVE 07/19/2013 1223   LEUKOCYTESUR NEGATIVE 07/19/2013 1223    FURTHER DISCHARGE INSTRUCTIONS:   Get Medicines reviewed and adjusted: Please take all your medications with you for your next visit with your Primary MD   Laboratory/radiological data: Please request your Primary MD to go over all hospital tests and procedure/radiological results at the follow up, please ask your Primary MD to get all Hospital records sent to his/her office.   In some cases, they will be blood work, cultures and biopsy results pending at the time of your discharge. Please request that your primary care M.D. goes through all the records of your hospital data and follows up on these results.   Also Note the following: If you experience worsening of your admission symptoms, develop shortness of breath, life threatening emergency, suicidal or homicidal thoughts you must seek medical attention immediately by calling 911 or calling your MD  immediately  if symptoms less severe.   You must read complete instructions/literature along with all the possible adverse reactions/side effects for all the Medicines you take and that have been prescribed to you. Take any new Medicines after you have completely understood and accpet all the possible adverse reactions/side effects.    Do not drive when taking Pain medications or sleeping medications (Benzodaizepines)   Do not take more than prescribed Pain, Sleep and Anxiety Medications. It is not advisable to combine anxiety,sleep and pain medications without talking with your primary care practitioner   Special Instructions: If you have smoked or chewed Tobacco  in the last 2 yrs please stop smoking, stop any regular Alcohol  and or any Recreational drug use.   Wear Seat belts while driving.   Please note: You were cared for by a hospitalist during your hospital stay. Once you are discharged, your primary care physician will handle any further medical issues. Please note that NO REFILLS for any discharge medications will be authorized once you are discharged, as it is imperative that you return to your primary care physician (or establish a relationship with a primary care physician if you do not have one) for your post hospital discharge needs so that they can reassess your need for medications and monitor your lab values.  Time coordinating discharge: 40 minutes  SIGNED:  Marzetta Board, MD, PhD 09/25/2022, 2:27 PM

## 2022-09-25 NOTE — TOC Initial Note (Addendum)
Transition of Care [facility]) - Initial/Assessment Note    Patient Details  Name: [name] MRN: [id number] Date of Birth: [birth year]  Transition of [facility]) CM/SW Contact:    Erenest Rasher, RN Phone Number: [phone] 09/25/2022, 3:23 PM  Clinical Narrative:                 HF TOC CM spoke to pt and dtr, [name] at bedside. DME arranged with [employer] for oxygen and NIV. Pt agreeable. Pt has a scale for daily weights. Dtr is a Therapist, sports and able to assist him if needed at home. Contacted [employer] rep, Jermaine with referral.   Pt had appt with new Pulm, Dr Verlee Monte. Dtr will call to reschedule appt with Pulmonologist office.   Expected Discharge Plan: Home/Self Care Barriers to Discharge: No Barriers Identified   Patient Goals and CMS Choice Patient states their goals for this hospitalization and ongoing recovery are:: Wants to remain independent CMS Medicare.gov Compare Post Acute Care list provided to:: Patient Choice offered to / list presented to : Patient, Adult Children  Expected Discharge Plan and Services Expected Discharge Plan: Home/Self Care   Discharge Planning Services: CM Consult Post Acute Care Choice: Durable Medical Equipment Living arrangements for the past 2 months: Single Family Home Expected Discharge Date: 09/25/22               DME Arranged: NIV, Oxygen DME Agency: [employer] Date DME Agency Contacted: 09/25/22 Time DME Agency Contacted: 45 Representative spoke with at DME Agency: Melene Muller            Prior Living Arrangements/Services Living arrangements for the past 2 months: Single Family Home Lives with:: Spouse   Do you feel safe going back to the place where you live?: Yes      Need for Family Participation in Patient Care: No (Comment) Care giver support system in place?: Yes (comment) Current home services: DME (nebulizer machine, Pulse ox, scale) Criminal Activity/Legal Involvement Pertinent to Current  Situation/Hospitalization: No - Comment as needed  Activities of Daily Living Home Assistive Devices/Equipment: None ADL Screening (condition at time of admission) Patient's cognitive ability adequate to safely complete daily activities?: Yes Is the patient deaf or have difficulty hearing?: Yes Does the patient have difficulty seeing, even when wearing glasses/contacts?: No Does the patient have difficulty concentrating, remembering, or making decisions?: No Patient able to express need for assistance with ADLs?: Yes Does the patient have difficulty dressing or bathing?: No Independently performs ADLs?: Yes (appropriate for developmental age) Does the patient have difficulty walking or climbing stairs?: Yes Weakness of Legs: Both Weakness of Arms/Hands: None  Permission Sought/Granted Permission sought to share information with : Case Manager, Family Supports, PCP Permission granted to share information with : Yes, Verbal Permission Granted  Share Information with NAME: Norva Karvonen     Permission granted to share info w Relationship: daughter  Permission granted to share info w Contact Information: [phone]  Emotional Assessment Appearance:: Appears stated age Attitude/Demeanor/Rapport: Engaged Affect (typically observed): Accepting Orientation: : Oriented to Self, Oriented to Place, Oriented to  Time, Oriented to Situation   Psych Involvement: No (comment)  Admission diagnosis:  Acute respiratory failure with hypoxia (HCC) [J96.01] Acute on chronic diastolic (congestive) heart failure (HCC) [I50.33] Acute on chronic congestive heart failure, unspecified heart failure type [facility]) [I50.9] Patient Active Problem List   Diagnosis Date Noted   Acute on chronic diastolic (congestive) heart failure ([place]) 09/20/2022   Acute CHF (congestive  heart failure) ([place]) 11/30/2021   Acute on chronic diastolic CHF (congestive heart failure) ([place]) 07/08/2021   Malignant neoplasm of prostate  ([place]) 11/29/2019   Chronic multifocal osteomyelitis, right ankle and foot ([place]) 03/23/2018   Diabetic ulcer of toe of right foot associated with type 2 diabetes mellitus, with fat layer exposed ([place]) 03/05/2018   Cellulitis of extremity 11/19/2017   GERD (gastroesophageal reflux disease) 02/05/2016   Benign essential hypertension 01/28/2016   Uncontrolled type 2 diabetes mellitus with diabetic polyneuropathy, with long-term current use of insulin 01/28/2016   Ischemic heart disease 05/19/2014   Peripheral vascular disease, unspecified ([place]) 01/12/2014   Atherosclerosis of native arteries of the extremities with ulceration(440.23) 12/08/2013   Wound drainage 07/25/2013   Carotid artery stenosis 07/15/2013   Right leg claudication ([place]) 07/08/2013   Right carotid bruit 07/08/2013   CAD (coronary artery disease) 06/19/2011   HTN (hypertension) 06/19/2011   Hyperlipidemia 06/19/2011   Atherosclerotic heart disease of native coronary artery without angina pectoris 06/19/2011   PCP:  Haywood Pao, MD Pharmacy:   [facility] ORDER) [place], [place] [address] Phone: [phone] Fax: [place] [address], [place] [address] [place] [address] Phone: [phone] Fax: [place], [place] [address] [place] [address] Phone: [phone] Fax: [phone]  [facility] [address] [place] [address] Phone: [phone] Fax: [phone]     Social Determinants of Health (SDOH) Interventions    Readmission Risk Interventions     No data to display

## 2022-09-25 NOTE — Inpatient Diabetes Management (Signed)
Inpatient Diabetes Program Recommendations  AACE/ADA: New Consensus Statement on Inpatient Glycemic Control   Target Ranges:  Prepandial:   less than 140 mg/dL      Peak postprandial:   less than 180 mg/dL (1-2 hours)      Critically ill patients:  140 - 180 mg/dL    Latest Reference Range & Units 09/24/22 06:31 09/24/22 10:54 09/24/22 16:08 09/24/22 20:58 09/25/22 06:00 09/25/22 11:01  Glucose-Capillary 70 - 99 mg/dL 201 (H) 260 (H) 309 (H) 222 (H) 181 (H) 349 (H)   Review of Glycemic Control  Current orders for Inpatient glycemic control: Levemir 20 units daily, Novolog 2 units TID with meals, Novolog 0-9 units TID with meals, Novolog 0-5 units QHS, Farxiga 10 mg daily  Inpatient Diabetes Program Recommendations:    Insulin: Noted Farxiga added today. If post prandial glucose remains consistently over 180 mg/dl, please consider increasing meal coverage to Novolog 4 units TID with meals.  Thanks, Barnie Alderman, RN, MSN, [place] Diabetes Coordinator Inpatient Diabetes Program [phone] (Team Pager from 8am to [place])

## 2022-09-25 NOTE — Progress Notes (Signed)
   09/25/22 1000  Mobility  Activity Ambulated with assistance in hallway  Level of Assistance Standby assist, set-up cues, supervision of patient - no hands on  Assistive Device None  Distance Ambulated (ft) 290 ft  Activity Response Tolerated well  Mobility Referral Yes  $Mobility charge 1 Mobility   Mobility Specialist Progress Note  Pre-Mobility: 96% SpO2 During Mobility: 85-92% SpO2 Post-Mobility: 91% SpO2  Pt was in bed and agreeable. X2 standing breaks d/t SOB. Returned to bed w/ all needs met and call bell in reach.   Lucious Groves Mobility Specialist  Please contact via SecureChat or Rehab office at [phone]

## 2022-09-25 NOTE — Progress Notes (Addendum)
Advanced Heart Failure Rounding Note  PCP-Cardiologist: Mertie Moores, MD   Subjective:   RHC 11/28   RA = 8 RV = 48/10 PA = 45/20 (34) PCW = 13 Fick cardiac output/index = 6.0/3.0 Thermo CO/CI = 4.8/2.4  PVR = 3.3 WU (Fick) Ao sat = 90% PA sat = 55%, 58%   CO-OX 51%.  Diuretics have been on hold d/t low filling pressures and AKI.  Scr improved, 2.1>1.7.  Seen while ambulating with mobility this am. Stopped to break halfway down the hall d/t desaturations in mid 80s. Denies dyspnea. Great appetite.  Objective:   Weight Range: 78 kg Body mass index is 23.99 kg/m.   Vital Signs:   Temp:  [97.6 F (36.4 C)-98.9 F (37.2 C)] 97.9 F (36.6 C) (11/30 0712) Pulse Rate:  [55-68] 62 (11/30 0712) Resp:  [15-20] 15 (11/30 0712) BP: (117-129)/(53-56) 117/55 (11/30 0712) SpO2:  [95 %-98 %] 98 % (11/30 0850) Weight:  [78 kg] 78 kg (11/30 0001) Last BM Date : 09/24/22  Weight change: Filed Weights   09/23/22 0439 09/24/22 0518 09/25/22 0001  Weight: 79.8 kg 77.5 kg 78 kg    Intake/Output:   Intake/Output Summary (Last 24 hours) at 09/25/2022 0904 Last data filed at 09/25/2022 0800 Gross per 24 hour  Intake 832 ml  Output 1975 ml  Net -1143 ml     Physical Exam  General:  Ambulating in hallway. HEENT: normal Neck: supple. JVP ~ 8 cm. Carotids 2+ bilat; no bruits.  Cor: PMI nondisplaced. Regular rate & rhythm. No rubs, gallops or murmurs. Lungs: clear Abdomen: soft, nontender, nondistended.  Extremities: no cyanosis, clubbing, rash, edema Neuro: alert & orientedx3, cranial nerves grossly intact. moves all 4 extremities w/o difficulty. Affect pleasant   Telemetry   NSR 60s (Personally reviewed)  Labs    CBC Recent Labs    09/23/22 2000 09/24/22 0658  WBC 5.6 4.8  HGB 9.9* 9.4*  HCT 31.7* 30.7*  MCV 85.9 85.8  PLT 210 591   Basic Metabolic Panel Recent Labs    09/24/22 0658 09/25/22 0500  NA 135 137  K 3.3* 4.2  CL 96* 100  CO2 28 28   GLUCOSE 212* 194*  BUN 37* 31*  CREATININE 2.14* 1.70*  CALCIUM 8.8* 9.2   Liver Function Tests Recent Labs    09/22/22 1824 09/24/22 0658  AST 17 18  ALT 14 13  ALKPHOS 58 63  BILITOT 0.9 0.9  PROT 7.0 6.3*  ALBUMIN 3.2* 2.9*   No results for input(s): "LIPASE", "AMYLASE" in the last 72 hours. Cardiac Enzymes No results for input(s): "CKTOTAL", "CKMB", "CKMBINDEX", "TROPONINI" in the last 72 hours.  BNP: BNP (last 3 results) Recent Labs    11/30/21 1853 09/20/22 1129  BNP 779.9* 1,408.9*    ProBNP (last 3 results) No results for input(s): "PROBNP" in the last 8760 hours.   D-Dimer No results for input(s): "DDIMER" in the last 72 hours.  Hemoglobin A1C No results for input(s): "HGBA1C" in the last 72 hours.  Fasting Lipid Panel No results for input(s): "CHOL", "HDL", "LDLCALC", "TRIG", "CHOLHDL", "LDLDIRECT" in the last 72 hours. Thyroid Function Tests No results for input(s): "TSH", "T4TOTAL", "T3FREE", "THYROIDAB" in the last 72 hours.  Invalid input(s): "FREET3"  Other results:   Imaging    No results found.   Medications:     Scheduled Medications:  aspirin  81 mg Oral Daily   Chlorhexidine Gluconate Cloth  6 each Topical Daily   clopidogrel  75 mg Oral Daily   enoxaparin (LOVENOX) injection  40 mg Subcutaneous Q24H   fluticasone furoate-vilanterol  1 puff Inhalation Daily   insulin aspart  0-5 Units Subcutaneous QHS   insulin aspart  0-9 Units Subcutaneous TID WC   insulin aspart  2 Units Subcutaneous TID WC   insulin detemir  20 Units Subcutaneous Daily   pantoprazole  40 mg Oral Daily   rosuvastatin  20 mg Oral Daily   sodium chloride flush  10-40 mL Intracatheter Q12H   sodium chloride flush  3 mL Intravenous Q12H   sodium chloride flush  3 mL Intravenous Q12H   umeclidinium bromide  1 puff Inhalation Daily    Infusions:  sodium chloride      PRN Medications: sodium chloride, acetaminophen, albuterol, ondansetron **OR**  ondansetron (ZOFRAN) IV, sodium chloride flush, sodium chloride flush    Patient Profile   Mr. Seyfried is a 76 y.o. male with CAD c/p CABG 5102, chronic diastolic CHF, chronic DOE, Rt CEA >31yr ago, Lt CEA 14', HTN, DM2, HLD, PAD w/ Rt SFA and Rt politeal artery vascular stents 15', prostate cancer. AHF team asked to see for acute on chronic combined systolic and diastolic CHF, previously HFpEF now HFrEF, EF now 30-35%.    Assessment/Plan   Acute on chronic combined systolic and diastolic CHF, previously HFpEF now HFrEF - Echo 9/22: EF 60-65%, normal RV function, LA mildly elevated, trivial MR/TR, mild-mod aortic valve regurg - Echo EF 30-35%, mild concentric LVH, GIIDD, RV function mildly reduced, mild MR, mild to mod TR, mild aortic valve regurg - presented NYHA class IV with SOB, orthopnea and BLE edema - volume overloaded on admission suspect low output 2/2 ischemia?, recently treated PNA?, PVCs?  - RHC with low filling pressures and preserved cardiac output.  - Holding diuretics and GDMT. Scr improving.  - Start Farxiga 10 mg daily. A1c 7.0% - Consider MRA next (takes Eplerenone at home) - hold BB with acute exacerbation - needs outpatient sleep study 2. CAD s/p CABG  - Hx CABG 2003 - HsTrop 79>61, suspect demand ischemia with volume overload - No chest pain - If EF does not improve on f/u echo may need to consider outpatient coronary/CABG angiogram -Continue Plavix, ASA, and statin  AKI on CKD st 3a - likely cardiorenal, baseline Cr. 1 - Cr peaked at 2.7. today 2.14.   - 1.7 today - Holding diuretics and GDMT. Adding back slowly as above - Avoid hypotension Anemia - Iron deficient - Receiving feraheme PAD - s/p intervention 15' Rt SFA and Rt politeal artery vascular stents - Continue plavix HTN - BP stable. Meds as above DM - Continue SSI, Hgb A1c 7.0 - per primary - Adding Farxiga COPD - has [place] new patient appointment scheduled for 12/8 - Continue nebs  -  recently treated for PNA 9. Hypokalemia  - Resolved. Supp K as needed - Supp K   10. Carotid artery stenosis - S/p b/l CEA   Will need home O2 at discharge. Completed assessment with mobility.   Length of Stay: [place] LINDSAY N NP-C  09/25/2022, 9:04 AM  Advanced Heart Failure Team Pager [phone](M-F; 7a - 5p)  Please contact [facility] for night-coverage after hours (5p -7a ) and weekends on [email or web address]  Patient seen and examined with the above-signed Advanced Practice Provider and/or Housestaff. I personally reviewed laboratory data, imaging studies and relevant notes. I independently examined the patient and formulated the important aspects of the plan. I have  edited the note to reflect any of my changes or salient points. I have personally discussed the plan with the patient and/or family.  Feels better. Volume status ok. SCr improved. Co-ox marginal but ok. Desatted to 85% with hall walk   General:  Sitting up in bed  No resp difficulty HEENT: normal Neck: supple. no JVD. Carotids 2+ bilat; no bruits. No lymphadenopathy or thryomegaly appreciated. Cor: PMI nondisplaced. Regular rate & rhythm. No rubs, gallops or murmurs. Lungs: clear Abdomen: soft, nontender, nondistended. No hepatosplenomegaly. No bruits or masses. Good bowel sounds. Extremities: no cyanosis, clubbing, rash, edema Neuro: alert & orientedx3, cranial nerves grossly intact. moves all 4 extremities w/o difficulty. Affect pleasant  Ok for d/c today. Restart lasix 40 daily tomorrow + new addition of [place]. Arrange f/u in HF Clinic. Will need home O2.   Glori Bickers, MD  3:16 PM

## 2022-09-25 NOTE — TOC CM/SW Note (Signed)
SATURATION QUALIFICATIONS: (This note is used to comply with regulatory documentation for home oxygen)  Patient Saturations on Room Air at Rest = 96%  Patient Saturations on Room Air while Ambulating = 85%  Patient Saturations on 3 Liters of oxygen while Ambulating = 98%  Please briefly explain why patient needs home oxygen: HF, COPD

## 2022-09-25 NOTE — Inpatient Diabetes Management (Deleted)
Inpatient Diabetes Program Recommendations  AACE/ADA: New Consensus Statement on Inpatient Glycemic Control (2015)  Target Ranges:  Prepandial:   less than 140 mg/dL      Peak postprandial:   less than 180 mg/dL (1-2 hours)      Critically ill patients:  140 - 180 mg/dL   Lab Results  Component Value Date   GLUCAP 349 (H) 09/25/2022   HGBA1C 7.0 (H) 09/20/2022    Review of Glycemic Control  Latest Reference Range & Units 09/25/22 06:00 09/25/22 11:01  Glucose-Capillary 70 - 99 mg/dL 181 (H) 349 (H)  (H): Data is abnormally high  Diabetes history: DM2 Outpatient Diabetes medications:  NPH 40 units QAM, 35 QHS Regular insulin 6-15 units TID Metformin 500 mg BID Current orders for Inpatient glycemic control:  Levemir 20 QD, Novolog 0-9 units TID and 0-5 units QHS, Novolog 2 units TID Farxiga 10 mg QD  Inpatient Diabetes Program Recommendations:    Postprandials elevated.  Please consider:  Novolog 6 units TID with meals if consumes at least 50%.  Will continue to follow while inpatient.  Thank you, Reche Dixon, MSN, [place] Diabetes Coordinator Inpatient Diabetes Program [phone] (team pager from 8a-5p)

## 2022-09-25 NOTE — TOC CM/SW Note (Signed)
[name] has Acute on Chronic hypoxic/hypercapnic respiratory failure and COPD. Pt requires frequent durations of respiratory support and deteriorates quickly in the absence of non-invasive mechanical ventilator. BIPAP,BIPAP ST, AVAPS has been considered but has been ruled-out and insufficient. NIV therapy is needed Pt's PC02 was 45 on 09/24/2022. Interruption or failure to provide NIV would quickly lead to exacerbation of the patient's condition, lead to hospitalization and likely harm the patient or possibly death. Continued use of the NIV is preferred. Patient is able to maintain airway and clear secretions.

## 2022-09-25 NOTE — Progress Notes (Signed)
PICC removed per protocol. Vaseline and cotton gauze applied upon removal with direct pressure for 5 minutes. No bleeding noted. Instructed to remain in bed for 30 minutes, out of bed time is 4:52. Nurse notified. Instructed to keep dressing on for 24-48hrs, keep clean and dry. After 24-48hr site can be clean with mild soap and water, no scrubbing. Pt informed that if the site starts to bleed apply direct pressure and if bleeding doesn't stop to return to the emergency room. Pt teach back instructions.

## 2022-09-25 NOTE — Care Management Important Message (Signed)
Important Message  Patient Details  Name: CATALDO COSGRIFF MRN: [id number] Date of Birth: [birth year]   Medicare Important Message Given:  Yes     Shelda Altes 09/25/2022, 10:31 AM

## 2022-09-26 ENCOUNTER — Institutional Professional Consult (permissible substitution): Payer: Medicare HMO | Admitting: Student

## 2022-09-26 DIAGNOSIS — E1142 Type 2 diabetes mellitus with diabetic polyneuropathy: Secondary | ICD-10-CM | POA: Diagnosis not present

## 2022-10-01 DIAGNOSIS — I272 Pulmonary hypertension, unspecified: Secondary | ICD-10-CM | POA: Diagnosis not present

## 2022-10-01 DIAGNOSIS — Z9981 Dependence on supplemental oxygen: Secondary | ICD-10-CM | POA: Diagnosis not present

## 2022-10-01 DIAGNOSIS — I5043 Acute on chronic combined systolic (congestive) and diastolic (congestive) heart failure: Secondary | ICD-10-CM | POA: Diagnosis not present

## 2022-10-01 DIAGNOSIS — I6529 Occlusion and stenosis of unspecified carotid artery: Secondary | ICD-10-CM | POA: Diagnosis not present

## 2022-10-01 DIAGNOSIS — J449 Chronic obstructive pulmonary disease, unspecified: Secondary | ICD-10-CM | POA: Diagnosis not present

## 2022-10-01 DIAGNOSIS — E1151 Type 2 diabetes mellitus with diabetic peripheral angiopathy without gangrene: Secondary | ICD-10-CM | POA: Diagnosis not present

## 2022-10-01 DIAGNOSIS — I110 Hypertensive heart disease with heart failure: Secondary | ICD-10-CM | POA: Diagnosis not present

## 2022-10-01 DIAGNOSIS — I739 Peripheral vascular disease, unspecified: Secondary | ICD-10-CM | POA: Diagnosis not present

## 2022-10-01 DIAGNOSIS — I25810 Atherosclerosis of coronary artery bypass graft(s) without angina pectoris: Secondary | ICD-10-CM | POA: Diagnosis not present

## 2022-10-01 DIAGNOSIS — E7800 Pure hypercholesterolemia, unspecified: Secondary | ICD-10-CM | POA: Diagnosis not present

## 2022-10-03 ENCOUNTER — Other Ambulatory Visit (HOSPITAL_COMMUNITY): Payer: Self-pay | Admitting: Family Medicine

## 2022-10-03 MED ORDER — EPLERENONE 25 MG PO TABS: 25.0000 mg | ORAL_TABLET | Freq: Every day | ORAL | 3 refills | Status: DC

## 2022-10-03 NOTE — Progress Notes (Signed)
Labs 10/01/22 show K 5.6 and SCr 1.9. Called patient and asked him to hold eplerenone x 1 day, then resume at 25 mg daily (previously on 50 mg daily). Stop all KCL supplements. He is agreeable with changes and med list updated.  I will re-draw BMET at his follow up 10/07/22.  Allena Katz, FNP-BC

## 2022-10-06 NOTE — H&P (View-Only) (Signed)
ADVANCED HF CLINIC CONSULT NOTE  Primary Care: [name], [name], MD HF Cardiologist: Dr. Haroldine Laws  HPI: [name] is a 76 y.o. male with CAD c/p CABG 1287, chronic diastolic CHF, chronic DOE, Rt CEA >32yr ago, Lt CEA 14', HTN, DM2, HLD, PAD w/ Rt SFA and Rt politeal artery vascular stents 15', prostate cancer.    Admitted 186/76with a/c systolic heart failure, newly reduced EF. Echo this admission showed EF 30-35%, mild concentric LVH, GIIDD, RV function mildly reduced, mild MR, mild to mod TR, mild aortic valve regurg. Diuresed with IV lasix. Underwent RHC showing low filling pressures and preserved CO. GDMT started with Farxiga and Lasix. Other GDMT held with AKI. He was discharged home with oxygen, weight 172 lbs.  Today she returns for HF follow up. Overall feeling fine. He has mild SOB walking on flat ground. Poor energy. He snores but does not want a sleep study. Denies increasing SOB, CP, dizziness, edema, or PND/Orthopnea. Appetite ok. No fever or chills. Weight at home 168 pounds. Taking all medications. No tobacco/ETOH/drugs. Wearing 2L oxygen, has BiPap at home. Does not want to use it.  Cardiac Studies:   - RHC 11/23   RA = 8 RV = 48/10 PA = 45/20 (34) PCW = 13 Fick cardiac output/index = 6.0/3.0 Thermo CO/CI = 4.8/2.4  PVR = 3.3 WU (Fick) Ao sat = 90% PA sat = 55%, 58%    - Echo (11/23): EF 30-35%, mild concentric LVH, GIIDD, RV function mildly reduced, mild MR, mild to mod TR, mild aortic valve regurg  - Echo (9/22): EF 60-65%, normal RV function, LA mildly elevated, trivial MR/TR, mild-mod aortic valve regurg   Review of Systems: [y] = yes, '[ ]'$  = no   General: Weight gain '[ ]'$ ; Weight loss '[ ]'$ ; Anorexia '[ ]'$ ; Fatigue '[ ]'$ ; Fever '[ ]'$ ; Chills '[ ]'$ ; Weakness '[ ]'$   Cardiac: Chest pain/pressure '[ ]'$ ; Resting SOB '[ ]'$ ; Exertional SOB '[ ]'$ ; Orthopnea '[ ]'$ ; Pedal Edema '[ ]'$ ; Palpitations '[ ]'$ ; Syncope '[ ]'$ ; Presyncope '[ ]'$ ; Paroxysmal nocturnal dyspnea'[ ]'$   Pulmonary: Cough '[ ]'$ ;  Wheezing'[ ]'$ ; Hemoptysis'[ ]'$ ; Sputum '[ ]'$ ; Snoring '[ ]'$   GI: Vomiting'[ ]'$ ; Dysphagia'[ ]'$ ; Melena'[ ]'$ ; Hematochezia '[ ]'$ ; Heartburn'[ ]'$ ; Abdominal pain '[ ]'$ ; Constipation '[ ]'$ ; Diarrhea '[ ]'$ ; BRBPR '[ ]'$   GU: Hematuria'[ ]'$ ; Dysuria '[ ]'$ ; Nocturia'[ ]'$   Vascular: Pain in legs with walking '[ ]'$ ; Pain in feet with lying flat '[ ]'$ ; Non-healing sores '[ ]'$ ; Stroke '[ ]'$ ; TIA '[ ]'$ ; Slurred speech '[ ]'$ ;  Neuro: Headaches'[ ]'$ ; Vertigo'[ ]'$ ; Seizures'[ ]'$ ; Paresthesias'[ ]'$ ;Blurred vision '[ ]'$ ; Diplopia '[ ]'$ ; Vision changes '[ ]'$   Ortho/Skin: Arthritis '[ ]'$ ; Joint pain '[ ]'$ ; Muscle pain '[ ]'$ ; Joint swelling '[ ]'$ ; Back Pain '[ ]'$ ; Rash '[ ]'$   Psych: Depression'[ ]'$ ; Anxiety'[ ]'$   Heme: Bleeding problems '[ ]'$ ; Clotting disorders '[ ]'$ ; Anemia '[ ]'$   Endocrine: Diabetes [Blue.Reese]; Thyroid dysfunction'[ ]'$   Past Medical History:  Diagnosis Date   Arthritis    CAD (coronary artery disease)    Carotid artery occlusion    Diabetes mellitus    fasting 100-120s   Dizziness    Dysrhythmia    skips beats   GERD (gastroesophageal reflux disease)    History of kidney stones    Hyperlipidemia    Hypertension    IHD (ischemic heart disease)    Prior CABG in 2003   Normal nuclear stress test June 2012  Peripheral vascular disease (HCC)    Prostate cancer ([place])    Torn rotator cuff     Current Outpatient Medications  Medication Sig Dispense Refill   albuterol (VENTOLIN HFA) 108 (90 Base) MCG/ACT inhaler Inhale 2 puffs into the lungs every 6 (six) hours as needed for wheezing or shortness of breath.     amLODipine-benazepril (LOTREL) 10-40 MG capsule Take 1 capsule by mouth once daily 90 capsule 3   aspirin EC 81 MG tablet Take 81 mg by mouth daily.     Cholecalciferol (VITAMIN D-3) 125 MCG (5000 UT) TABS Take 5,000 Units by mouth daily.     clopidogrel (PLAVIX) 75 MG tablet Take 1 tablet by mouth once daily 90 tablet 2   dapagliflozin propanediol (FARXIGA) 10 MG TABS tablet Take 1 tablet (10 mg total) by mouth daily. 30 tablet 0   eplerenone (INSPRA) 25 MG  tablet Take 1 tablet (25 mg total) by mouth daily. 30 tablet 3   furosemide (LASIX) 20 MG tablet Take 3 tablets (60 mg total) by mouth daily. (Patient taking differently: Take 40 mg by mouth daily.) 90 tablet 0   insulin NPH Human (NOVOLIN N) 100 UNIT/ML injection Inject 35-40 Units into the skin See admin instructions. Inject 40 units into the skin in the morning and 35 units at night     insulin regular (NOVOLIN R) 100 units/mL injection Inject 6-15 Units into the skin 3 (three) times daily before meals. Per sliding scale     metFORMIN (GLUCOPHAGE-XR) 500 MG 24 hr tablet Take 500 mg by mouth 2 (two) times daily with a meal.      nitroGLYCERIN (NITROSTAT) 0.4 MG SL tablet Place 1 tablet (0.4 mg total) under the tongue every 5 (five) minutes as needed for chest pain. 25 tablet 6   pantoprazole (PROTONIX) 20 MG tablet Take 1 tablet by mouth once daily 90 tablet 3   rosuvastatin (CRESTOR) 20 MG tablet Take 1 tablet by mouth once daily (Patient taking differently: Take 20 mg by mouth daily.) 90 tablet 3   No current facility-administered medications for this encounter.   Allergies  Allergen Reactions   Aldactone [Spironolactone] Other (See Comments)    Swollen breast   Lipitor [Atorvastatin Calcium] Other (See Comments)    Muscle ache   Sulfa Drugs Cross Reactors Other (See Comments)    "raw spots"   Sulfamethoxazole-Trimethoprim     Other reaction(s): Rash   Codeine Rash   Social History   Socioeconomic History   Marital status: Married    Spouse name: Not on file   Number of children: 2   Years of education: Not on file   Highest education level: Not on file  Occupational History   Not on file  Tobacco Use   Smoking status: Former    Packs/day: 1.00    Years: 20.00    Total pack years: 20.00    Types: Cigarettes    Quit date: 10/27/2001    Years since quitting: 20.9    Passive exposure: Never   Smokeless tobacco: Never  Vaping Use   Vaping Use: Never used  Substance and  Sexual Activity   Alcohol use: No    Alcohol/week: 0.0 standard drinks of alcohol   Drug use: No   Sexual activity: Not Currently  Other Topics Concern   Not on file  Social History Narrative   Not on file   Social Determinants of Health   Financial Resource Strain: Not on file  Food Insecurity: No  Food Insecurity (09/21/2022)   Hunger Vital Sign    Worried About Running Out of Food in the Last Year: Never true    Ran Out of Food in the Last Year: Never true  Transportation Needs: No Transportation Needs (09/21/2022)   PRAPARE - Hydrologist (Medical): No    Lack of Transportation (Non-Medical): No  Physical Activity: Not on file  Stress: Not on file  Social Connections: Not on file  Intimate Partner Violence: Not At Risk (09/21/2022)   Humiliation, Afraid, Rape, and Kick questionnaire    Fear of Current or Ex-Partner: No    Emotionally Abused: No    Physically Abused: No    Sexually Abused: No   Family History  Problem Relation Age of Onset   Heart attack Mother    Diabetes Mother    Heart disease Mother    Hypertension Mother    Heart attack Father    Heart disease Father    Hypertension Father    Diabetes Brother    Diabetes Sister    Diabetes Daughter    Heart disease Daughter    Hypertension Daughter    Diabetes Sister    Breast cancer Neg Hx    Prostate cancer Neg Hx    Colon cancer Neg Hx    Pancreatic cancer Neg Hx    BP 110/60   Pulse 60   Wt 77.8 kg (171 lb 9.6 oz)   SpO2 100%   BMI 23.93 kg/m   Wt Readings from Last 3 Encounters:  10/07/22 77.8 kg (171 lb 9.6 oz)  09/25/22 78 kg (172 lb)  07/18/22 81.6 kg (180 lb)   PHYSICAL EXAM: General:  NAD. No resp difficulty, frail HEENT: Normal Neck: Supple. No JVD. Carotids 2+ bilat; no bruits. No lymphadenopathy or thryomegaly appreciated. Cor: PMI nondisplaced. Irregular rate & rhythm. No rubs, gallops or murmurs. Lungs: Clear Abdomen: Soft, nontender, nondistended.  No hepatosplenomegaly. No bruits or masses. Good bowel sounds. Extremities: No cyanosis, clubbing, rash, edema Neuro: Alert & oriented x 3, cranial nerves grossly intact. Moves all 4 extremities w/o difficulty. Affect pleasant.  ECG (personally reviewed): AFL 102 bpm  ASSESSMENT & PLAN: Chronic combined systolic and diastolic CHF, previously HFpEF now HFrEF - Echo 9/22: EF 60-65%, normal RV function, LA mildly elevated, trivial MR/TR, mild-mod aortic valve regurg  - Echo (11/23): EF 30-35%, mild concentric LVH, GIIDD, RV function mildly reduced, mild MR, mild to mod TR, mild aortic valve regurg - volume overloaded on admission suspect low output 2/2 ischemia?, recently treated PNA?, PVCs?  - RHC (11/23) with low filling pressures and preserved cardiac output.  - Today, NYHA II-early III, limited mostly by fatigue. Volume looks good today. - Continue Farxiga 10 mg daily. A1c 7.0% - Continue Lasix 40 mg daily. - Continue eplerenone 25 mg daily. - Continue amlodipine/benazepril for now. Plan to switch to [facility] soon. We discussed this today. - hold BB for now with recent acute exacerbation - Needs sleep study but he declines today. - Labs today.  2. AFL - New, rate controlled on ECG today. - ? If reason for newly decreased EF. - Asymptomatic - Stop ASA + Plavix. - Start Eliquis 5 mg bid. - Suspect he will not hold sinus w/o anti-arrhythmic.  - Start amiodarone 200 mg bid x 2 weeks, then decrease to 200 mg daily. Not ideal with COPD, CKD prohibits Tikosyn. - Arrange TEE/DCCV with Dr. Haroldine Laws next week. - Labs today. - Eventual sleep study  if he will agree.  3. CAD s/p CABG  - Hx CABG 2003 - No chest pain - As above, stop ASA and Plavix in setting of starting AC. - Continue statin - If EF does not improve on f/u echo, may need to consider outpatient coronary/CABG angiogram  4. CKD 3a - likely cardiorenal, baseline SCr. 1 - Avoid hypotension. - Add GDMT back slowly. - Labs  today  5. Anemia - Iron deficient - Received feraheme inpatient.  6. PAD - s/p intervention 15' Rt SFA and Rt politeal artery vascular stents - Denies claudication. - Stopping Plavix.  7. HTN - BP stable.  - Meds as above.  8. DM 2 - Hgb A1c 7.0 - He is on insulin. - Per PCP - Continue Wilder Glade.  9. COPD - has [facility] new patient appointment scheduled for 12/28 - Recently treated for PNA - Continue home oxygen  10. Carotid artery stenosis - S/p b/l CEA - Continue statin.   Follow up with APP 2 weeks after TEE/DCCV. Plan to titrated GDMT and repeat echo in 3 months.  Connor Katz, FNP-BC 10/07/22  Patient seen and examined with the above-signed Advanced Practice Provider and/or Housestaff. I personally reviewed laboratory data, imaging studies and relevant notes. I independently examined the patient and formulated the important aspects of the plan. I have edited the note to reflect any of my changes or salient points. I have personally discussed the plan with the patient and/or family.  He is here for post-hospital f/u. Feels better. On home O2 now. NYHA III. Volume status ok. Found to be in new onset AF today  General:  Elderly man on O2. No resp difficulty HEENT: normal Neck: supple. no JVD. Carotids 2+ bilat; no bruits. No lymphadenopathy or thryomegaly appreciated. Cor: PMI nondisplaced. Irregular rate & rhythm. No rubs, gallops or murmurs. Lungs: decreased throughout Abdomen: soft, nontender, nondistended. No hepatosplenomegaly. No bruits or masses. Good bowel sounds. Extremities: no cyanosis, clubbing, rash, edema Neuro: alert & orientedx3, cranial nerves grossly intact. moves all 4 extremities w/o difficulty. Affect pleasant  Volume status improved. Now with new onset AF. Discussed need for [facility]. Will start amio and plan TEE/DC-CV next week.   If EF not improving will need to consider ischemic eval.   Glori Bickers, MD  4:07 PM

## 2022-10-06 NOTE — Progress Notes (Signed)
ADVANCED HF CLINIC CONSULT NOTE  Referring Physician: Primary Care: Primary Cardiologist:  HPI: [name] is a 76 y.o. male with CAD c/p CABG 8366, chronic diastolic CHF, chronic DOE, Rt CEA >63yr ago, Lt CEA 14', HTN, DM2, HLD, PAD w/ Rt SFA and Rt politeal artery vascular stents 15', prostate cancer.    Mr. BWeiderpresented to the ED with SOB and BLE edema. Reports symptoms started ~2 days prior to admission. Had 48 hours of worsening SOB and noticed significant swelling in BLE. He reports taking additional lasix with no relief. His normal weight is 177-179lbs and the day he came in he was up to 185lbs. Reports compliance with all medications except hydralazine he cut back to once a day 2/2 bp's at home being in the 80s/90s. Hasn't been able to sleep 2/2 prthopnea. At baseline sleeps flat and walks around without fatigue or SOB. Denies drinking/smoking. Denies CP.    In the ED he was 87% on RA, BNP 1408, K 2.7, Mg 2.5, Cr 1.6 (baseline around 1), HsTrop 79>61, CXR with mild pulmonary edema, BP stable/soft, EKG sinus w/ 1st degree AV block. Diuresed with IV lasix and placed on supplemental O2.    On assessment, warm to touch, still slightly volume overloaded. Daughter at bedside. Denies CP. Still SOB with activity but comfortable at rest.    Echo 09/21/22: EF 30-35%, mild concentric LVH, GIIDD, RV function mildly reduced, mild MR, mild to mod TR, mild aortic valve regurg Echo 9/22: EF 60-65%, normal RV function, LA mildly elevated, trivial MR/TR, mild-mod aortic valve regurg   RHC 11/28   RA = 8 RV = 48/10 PA = 45/20 (34) PCW = 13 Fick cardiac output/index = 6.0/3.0 Thermo CO/CI = 4.8/2.4  PVR = 3.3 WU (Fick) Ao sat = 90% PA sat = 55%, 58%     Review of Systems: [y] = yes, '[ ]'$  = no   General: Weight gain '[ ]'$ ; Weight loss '[ ]'$ ; Anorexia '[ ]'$ ; Fatigue '[ ]'$ ; Fever '[ ]'$ ; Chills '[ ]'$ ; Weakness '[ ]'$   Cardiac: Chest pain/pressure '[ ]'$ ; Resting SOB '[ ]'$ ; Exertional SOB '[ ]'$ ; Orthopnea '[ ]'$ ; Pedal  Edema '[ ]'$ ; Palpitations '[ ]'$ ; Syncope '[ ]'$ ; Presyncope '[ ]'$ ; Paroxysmal nocturnal dyspnea'[ ]'$   Pulmonary: Cough '[ ]'$ ; Wheezing'[ ]'$ ; Hemoptysis'[ ]'$ ; Sputum '[ ]'$ ; Snoring '[ ]'$   GI: Vomiting'[ ]'$ ; Dysphagia'[ ]'$ ; Melena'[ ]'$ ; Hematochezia '[ ]'$ ; Heartburn'[ ]'$ ; Abdominal pain '[ ]'$ ; Constipation '[ ]'$ ; Diarrhea '[ ]'$ ; BRBPR '[ ]'$   GU: Hematuria'[ ]'$ ; Dysuria '[ ]'$ ; Nocturia'[ ]'$   Vascular: Pain in legs with walking '[ ]'$ ; Pain in feet with lying flat '[ ]'$ ; Non-healing sores '[ ]'$ ; Stroke '[ ]'$ ; TIA '[ ]'$ ; Slurred speech '[ ]'$ ;  Neuro: Headaches'[ ]'$ ; Vertigo'[ ]'$ ; Seizures'[ ]'$ ; Paresthesias'[ ]'$ ;Blurred vision '[ ]'$ ; Diplopia '[ ]'$ ; Vision changes '[ ]'$   Ortho/Skin: Arthritis '[ ]'$ ; Joint pain '[ ]'$ ; Muscle pain '[ ]'$ ; Joint swelling '[ ]'$ ; Back Pain '[ ]'$ ; Rash '[ ]'$   Psych: Depression'[ ]'$ ; Anxiety'[ ]'$   Heme: Bleeding problems '[ ]'$ ; Clotting disorders '[ ]'$ ; Anemia '[ ]'$   Endocrine: Diabetes '[ ]'$ ; Thyroid dysfunction'[ ]'$    Past Medical History:  Diagnosis Date   Arthritis    CAD (coronary artery disease)    Carotid artery occlusion    Diabetes mellitus    fasting 100-120s   Dizziness    Dysrhythmia    skips beats   GERD (gastroesophageal reflux disease)    History of kidney stones  Hyperlipidemia    Hypertension    IHD (ischemic heart disease)    Prior CABG in 2003   Normal nuclear stress test June 2012   Peripheral vascular disease ([place])    Prostate cancer ([place])    Torn rotator cuff     Current Outpatient Medications  Medication Sig Dispense Refill   albuterol (VENTOLIN HFA) 108 (90 Base) MCG/ACT inhaler Inhale 2 puffs into the lungs every 6 (six) hours as needed for wheezing or shortness of breath.     amLODipine-benazepril (LOTREL) 10-40 MG capsule Take 1 capsule by mouth once daily 90 capsule 3   aspirin EC 81 MG tablet Take 81 mg by mouth daily.     Cholecalciferol (VITAMIN D-3) 125 MCG (5000 UT) TABS Take 5,000 Units by mouth daily.     clopidogrel (PLAVIX) 75 MG tablet Take 1 tablet by mouth once daily 90 tablet 2   dapagliflozin propanediol  (FARXIGA) 10 MG TABS tablet Take 1 tablet (10 mg total) by mouth daily. 30 tablet 0   eplerenone (INSPRA) 25 MG tablet Take 1 tablet (25 mg total) by mouth daily. 30 tablet 3   furosemide (LASIX) 20 MG tablet Take 3 tablets (60 mg total) by mouth daily. 90 tablet 0   furosemide (LASIX) 40 MG tablet Take 1 tablet by mouth only as needed for swelling, along with the 60 mg tablets. 30 tablet 0   insulin NPH Human (NOVOLIN N) 100 UNIT/ML injection Inject 35-40 Units into the skin See admin instructions. Inject 40 units into the skin in the morning and 35 units at night     insulin regular (NOVOLIN R) 100 units/mL injection Inject 6-15 Units into the skin 3 (three) times daily before meals. Per sliding scale     metFORMIN (GLUCOPHAGE-XR) 500 MG 24 hr tablet Take 500 mg by mouth 2 (two) times daily with a meal.      nitroGLYCERIN (NITROSTAT) 0.4 MG SL tablet Place 1 tablet (0.4 mg total) under the tongue every 5 (five) minutes as needed for chest pain. (Patient not taking: Reported on 09/20/2022) 25 tablet 6   pantoprazole (PROTONIX) 20 MG tablet Take 1 tablet by mouth once daily 90 tablet 3   rosuvastatin (CRESTOR) 20 MG tablet Take 1 tablet by mouth once daily (Patient taking differently: Take 20 mg by mouth daily.) 90 tablet 3   No current facility-administered medications for this visit.    Allergies  Allergen Reactions   Aldactone [Spironolactone] Other (See Comments)    Swollen breast   Lipitor [Atorvastatin Calcium] Other (See Comments)    Muscle ache   Sulfa Drugs Cross Reactors Other (See Comments)    "raw spots"   Sulfamethoxazole-Trimethoprim     Other reaction(s): Rash   Codeine Rash      Social History   Socioeconomic History   Marital status: Married    Spouse name: Not on file   Number of children: 2   Years of education: Not on file   Highest education level: Not on file  Occupational History   Not on file  Tobacco Use   Smoking status: Former    Packs/day: 1.00     Years: 20.00    Total pack years: 20.00    Types: Cigarettes    Quit date: 10/27/2001    Years since quitting: 20.9    Passive exposure: Never   Smokeless tobacco: Never  Vaping Use   Vaping Use: Never used  Substance and Sexual Activity   Alcohol use: No  Alcohol/week: 0.0 standard drinks of alcohol   Drug use: No   Sexual activity: Not Currently  Other Topics Concern   Not on file  Social History Narrative   Not on file   Social Determinants of Health   Financial Resource Strain: Not on file  Food Insecurity: No Food Insecurity (09/21/2022)   Hunger Vital Sign    Worried About Running Out of Food in the Last Year: Never true    Ran Out of Food in the Last Year: Never true  Transportation Needs: No Transportation Needs (09/21/2022)   PRAPARE - Hydrologist (Medical): No    Lack of Transportation (Non-Medical): No  Physical Activity: Not on file  Stress: Not on file  Social Connections: Not on file  Intimate Partner Violence: Not At Risk (09/21/2022)   Humiliation, Afraid, Rape, and Kick questionnaire    Fear of Current or Ex-Partner: No    Emotionally Abused: No    Physically Abused: No    Sexually Abused: No      Family History  Problem Relation Age of Onset   Heart attack Mother    Diabetes Mother    Heart disease Mother    Hypertension Mother    Heart attack Father    Heart disease Father    Hypertension Father    Diabetes Brother    Diabetes Sister    Diabetes Daughter    Heart disease Daughter    Hypertension Daughter    Diabetes Sister    Breast cancer Neg Hx    Prostate cancer Neg Hx    Colon cancer Neg Hx    Pancreatic cancer Neg Hx     There were no vitals filed for this visit.  PHYSICAL EXAM: General:  Well appearing. No respiratory difficulty HEENT: normal Neck: supple. no JVD. Carotids 2+ bilat; no bruits. No lymphadenopathy or thryomegaly appreciated. Cor: PMI nondisplaced. Regular rate & rhythm. No rubs,  gallops or murmurs. Lungs: clear Abdomen: soft, nontender, nondistended. No hepatosplenomegaly. No bruits or masses. Good bowel sounds. Extremities: no cyanosis, clubbing, rash, edema Neuro: alert & oriented x 3, cranial nerves grossly intact. moves all 4 extremities w/o difficulty. Affect pleasant.  ECG:   ASSESSMENT & PLAN: Acute on chronic combined systolic and diastolic CHF, previously HFpEF now HFrEF - Echo 9/22: EF 60-65%, normal RV function, LA mildly elevated, trivial MR/TR, mild-mod aortic valve regurg - Echo EF 30-35%, mild concentric LVH, GIIDD, RV function mildly reduced, mild MR, mild to mod TR, mild aortic valve regurg - presented NYHA class IV with SOB, orthopnea and BLE edema - volume overloaded on admission suspect low output 2/2 ischemia?, recently treated PNA?, PVCs?  - RHC with low filling pressures and preserved cardiac output.  - Holding diuretics and GDMT. Scr improving.  - Start Farxiga 10 mg daily. A1c 7.0% - Consider MRA next (takes Eplerenone at home) - hold BB with acute exacerbation - needs outpatient sleep study 2. CAD s/p CABG  - Hx CABG 2003 - HsTrop 79>61, suspect demand ischemia with volume overload - No chest pain - If EF does not improve on f/u echo may need to consider outpatient coronary/CABG angiogram -Continue Plavix, ASA, and statin  AKI on CKD st 3a - likely cardiorenal, baseline Cr. 1 - Cr peaked at 2.7. today 2.14.   - 1.7 today - Holding diuretics and GDMT. Adding back slowly as above - Avoid hypotension Anemia - Iron deficient - Receiving feraheme PAD - s/p intervention 15' Rt  SFA and Rt politeal artery vascular stents - Continue plavix HTN - BP stable. Meds as above DM - Continue SSI, Hgb A1c 7.0 - per primary - Adding Farxiga COPD - has [facility] new patient appointment scheduled for 12/8 - Continue nebs  - recently treated for PNA 9. Hypokalemia  - Resolved. Supp K as needed - Supp K   10. Carotid artery stenosis - S/p  b/l CEA     Will need home O2 at discharge. Completed assessment with mobility.

## 2022-10-07 ENCOUNTER — Other Ambulatory Visit (HOSPITAL_COMMUNITY): Payer: Self-pay

## 2022-10-07 ENCOUNTER — Ambulatory Visit (HOSPITAL_COMMUNITY)
Admit: 2022-10-07 | Discharge: 2022-10-07 | Disposition: A | Discharge status: Home / Self Care | Payer: Medicare HMO | Attending: Family Medicine | Admitting: Family Medicine

## 2022-10-07 VITALS — BP 110/60 | HR 60 | Wt 171.6 lb

## 2022-10-07 DIAGNOSIS — E1122 Type 2 diabetes mellitus with diabetic chronic kidney disease: Secondary | ICD-10-CM | POA: Diagnosis not present

## 2022-10-07 DIAGNOSIS — E1151 Type 2 diabetes mellitus with diabetic peripheral angiopathy without gangrene: Secondary | ICD-10-CM | POA: Insufficient documentation

## 2022-10-07 DIAGNOSIS — I5022 Chronic systolic (congestive) heart failure: Secondary | ICD-10-CM

## 2022-10-07 DIAGNOSIS — E785 Hyperlipidemia, unspecified: Secondary | ICD-10-CM | POA: Insufficient documentation

## 2022-10-07 DIAGNOSIS — Z79899 Other long term (current) drug therapy: Secondary | ICD-10-CM | POA: Insufficient documentation

## 2022-10-07 DIAGNOSIS — I480 Paroxysmal atrial fibrillation: Secondary | ICD-10-CM | POA: Diagnosis not present

## 2022-10-07 DIAGNOSIS — I25118 Atherosclerotic heart disease of native coronary artery with other forms of angina pectoris: Secondary | ICD-10-CM

## 2022-10-07 DIAGNOSIS — Z794 Long term (current) use of insulin: Secondary | ICD-10-CM | POA: Diagnosis not present

## 2022-10-07 DIAGNOSIS — Z951 Presence of aortocoronary bypass graft: Secondary | ICD-10-CM | POA: Insufficient documentation

## 2022-10-07 DIAGNOSIS — I6529 Occlusion and stenosis of unspecified carotid artery: Secondary | ICD-10-CM | POA: Diagnosis not present

## 2022-10-07 DIAGNOSIS — J449 Chronic obstructive pulmonary disease, unspecified: Secondary | ICD-10-CM | POA: Diagnosis not present

## 2022-10-07 DIAGNOSIS — D649 Anemia, unspecified: Secondary | ICD-10-CM | POA: Insufficient documentation

## 2022-10-07 DIAGNOSIS — I5032 Chronic diastolic (congestive) heart failure: Secondary | ICD-10-CM

## 2022-10-07 DIAGNOSIS — N1831 Chronic kidney disease, stage 3a: Secondary | ICD-10-CM | POA: Diagnosis not present

## 2022-10-07 DIAGNOSIS — I130 Hypertensive heart and chronic kidney disease with heart failure and stage 1 through stage 4 chronic kidney disease, or unspecified chronic kidney disease: Secondary | ICD-10-CM | POA: Insufficient documentation

## 2022-10-07 DIAGNOSIS — Z8546 Personal history of malignant neoplasm of prostate: Secondary | ICD-10-CM | POA: Diagnosis not present

## 2022-10-07 DIAGNOSIS — I5042 Chronic combined systolic (congestive) and diastolic (congestive) heart failure: Secondary | ICD-10-CM | POA: Diagnosis not present

## 2022-10-07 DIAGNOSIS — Z7902 Long term (current) use of antithrombotics/antiplatelets: Secondary | ICD-10-CM | POA: Insufficient documentation

## 2022-10-07 DIAGNOSIS — I2510 Atherosclerotic heart disease of native coronary artery without angina pectoris: Secondary | ICD-10-CM | POA: Insufficient documentation

## 2022-10-07 MED ORDER — AMIODARONE HCL 200 MG PO TABS: 200.0000 mg | ORAL_TABLET | Freq: Every day | ORAL | 3 refills | Status: DC

## 2022-10-07 MED ORDER — APIXABAN 5 MG PO TABS: 5.0000 mg | ORAL_TABLET | Freq: Two times a day (BID) | ORAL | 8 refills | Status: DC

## 2022-10-07 MED ORDER — AMIODARONE HCL 200 MG PO TABS: 200.0000 mg | ORAL_TABLET | Freq: Two times a day (BID) | ORAL | 0 refills | Status: DC

## 2022-10-07 MED ORDER — DAPAGLIFLOZIN PROPANEDIOL 10 MG PO TABS: 10.0000 mg | ORAL_TABLET | Freq: Every day | ORAL | 8 refills | Status: DC

## 2022-10-07 NOTE — Patient Instructions (Addendum)
Thank you for coming in today  Labs were done today, if any labs are abnormal the clinic will call you No news is good news  START Eliquis 5 mg 1 tablet twice daily  START Amiodarone 200 mg 1 tablet twice daily for 2 weeks 10/07/22 to 10/20/22 and 10/21/22 start 1 tablet daily  STOP Aspirin STOP Plavix   Your physician recommends that you schedule a follow-up appointment in: 2 weeks in clinic after TEE cardiversion 12 weeks with Dr. Haroldine Laws with echocardiogram  Your physician has requested that you have an echocardiogram. Echocardiography is a painless test that uses sound waves to create images of your heart. It provides your doctor with information about the size and shape of your heart and how well your heart's chambers and valves are working. This procedure takes approximately one hour. There are no restrictions for this procedure.  You are scheduled for a TEE/Cardioversion/TEE Cardioversion on 10/22/2022 with Dr. Haroldine Laws  Please arrive at the [facility] (Main Entrance A) at [facility]: [place], [place] [address] at 6:30 am (1 hour prior to procedure unless lab work is needed; if lab work is needed arrive 1.5 hours ahead)  DIET: Nothing to eat or drink after midnight except a sip of water with medications (see medication instructions below)  Medication Instructions:   Continue your anticoagulant: Eliquis You will need to continue your anticoagulant after your procedure until you  are told by your  Provider that it is safe to stop   Labs: If patient is on Coumadin, patient needs pt/INR, CBC, BMET within 3 days (No pt/INR needed for patients taking Xarelto, Eliquis, Pradaxa) For patients receiving anesthesia for TEE and all Cardioversion patients: BMET, CBC within 1 week  You must have a responsible person to drive you home and stay in the waiting area during your procedure. Failure to do so could result in cancellation.  Bring your insurance  cards.  *Special Note: Every effort is made to have your procedure done on time. Occasionally there are emergencies that occur at the hospital that may cause delays. Please be patient if a delay does occur.     Do the following things EVERYDAY: Weigh yourself in the morning before breakfast. Write it down and keep it in a log. Take your medicines as prescribed Eat low salt foods--Limit salt (sodium) to 2000 mg per day.  Stay as active as you can everyday Limit all fluids for the day to less than 2 liters  At the [facility], you and your health needs are our priority. As part of our continuing mission to provide you with exceptional heart care, we have created designated Provider Care Teams. These Care Teams include your primary Cardiologist (physician) and Advanced Practice Providers (APPs- Physician Assistants and Nurse Practitioners) who all work together to provide you with the care you need, when you need it.   You may see any of the following providers on your designated Care Team at your next follow up: Dr Glori Bickers Dr Loralie Champagne Dr. Roxana Hires, NP Lyda Jester, [place] [facility] [place], [place] Forestine Na, NP Audry Riles, PharmD   Please be sure to bring in all your medications bottles to every appointment.   If you have any questions or concerns before your next appointment please send [place] a message through [place] or call our office at [phone].    TO LEAVE A MESSAGE FOR THE NURSE SELECT OPTION 2, PLEASE LEAVE A  MESSAGE INCLUDING: YOUR NAME DATE OF BIRTH CALL BACK NUMBER REASON FOR CALL**this is important as we prioritize the call backs  YOU WILL RECEIVE A CALL BACK THE SAME DAY AS LONG AS YOU CALL BEFORE 4:00 PM

## 2022-10-15 ENCOUNTER — Encounter (HOSPITAL_COMMUNITY): Payer: Self-pay

## 2022-10-16 ENCOUNTER — Telehealth (HOSPITAL_COMMUNITY): Payer: Self-pay

## 2022-10-16 ENCOUNTER — Other Ambulatory Visit (HOSPITAL_COMMUNITY): Payer: Self-pay

## 2022-10-16 ENCOUNTER — Other Ambulatory Visit (HOSPITAL_COMMUNITY): Payer: Self-pay | Admitting: Internal Medicine

## 2022-10-16 ENCOUNTER — Ambulatory Visit (HOSPITAL_COMMUNITY)
Admission: RE | Admit: 2022-10-16 | Discharge: 2022-10-16 | Disposition: A | Discharge status: Home / Self Care | Payer: Medicare HMO | Source: Ambulatory Visit | Attending: Internal Medicine | Admitting: Internal Medicine

## 2022-10-16 DIAGNOSIS — I5032 Chronic diastolic (congestive) heart failure: Secondary | ICD-10-CM | POA: Insufficient documentation

## 2022-10-16 DIAGNOSIS — E875 Hyperkalemia: Secondary | ICD-10-CM | POA: Diagnosis not present

## 2022-10-16 DIAGNOSIS — N179 Acute kidney failure, unspecified: Secondary | ICD-10-CM | POA: Diagnosis not present

## 2022-10-16 LAB — BASIC METABOLIC PANEL
Anion gap: 7 (ref 5–15)
BUN: 81 mg/dL — ABNORMAL HIGH (ref 8–23)
CO2: 18 mmol/L — ABNORMAL LOW (ref 22–32)
Calcium: 9 mg/dL (ref 8.9–10.3)
Chloride: 106 mmol/L (ref 98–111)
Creatinine, Ser: 3.26 mg/dL — ABNORMAL HIGH (ref 0.61–1.24)
GFR, Estimated: 19 mL/min — ABNORMAL LOW (ref >60)
Glucose, Bld: 235 mg/dL — ABNORMAL HIGH (ref 70–99)
Potassium: >7.5 mmol/L (ref 3.5–5.1)
Sodium: 131 mmol/L — ABNORMAL LOW (ref 135–145)

## 2022-10-16 LAB — CBC WITH DIFFERENTIAL/PLATELET
Abs Immature Granulocytes: 0.02 10*3/uL (ref 0.00–0.07)
Basophils Absolute: 0.1 10*3/uL (ref 0.0–0.1)
Basophils Relative: 1 %
Eosinophils Absolute: 0.1 10*3/uL (ref 0.0–0.5)
Eosinophils Relative: 2 %
HCT: 40.1 % (ref 39.0–52.0)
Hemoglobin: 12.3 g/dL — ABNORMAL LOW (ref 13.0–17.0)
Immature Granulocytes: 0 %
Lymphocytes Relative: 18 %
Lymphs Abs: 1.3 10*3/uL (ref 0.7–4.0)
MCH: 27.8 pg (ref 26.0–34.0)
MCHC: 30.7 g/dL (ref 30.0–36.0)
MCV: 90.5 fL (ref 80.0–100.0)
Monocytes Absolute: 0.4 10*3/uL (ref 0.1–1.0)
Monocytes Relative: 6 %
Neutro Abs: 5.1 10*3/uL (ref 1.7–7.7)
Neutrophils Relative %: 73 %
Platelets: 221 10*3/uL (ref 150–400)
RBC: 4.43 MIL/uL (ref 4.22–5.81)
RDW: 18.1 % — ABNORMAL HIGH (ref 11.5–15.5)
WBC: 7 10*3/uL (ref 4.0–10.5)
nRBC: 0 % (ref 0.0–0.2)

## 2022-10-16 LAB — BRAIN NATRIURETIC PEPTIDE: B Natriuretic Peptide: 528.1 pg/mL — ABNORMAL HIGH (ref 0.0–100.0)

## 2022-10-16 NOTE — Telephone Encounter (Signed)
Labs were drawn, but were not run in the lab. I spoke with [name] his daughter and he will come back in today to have labs redone.

## 2022-10-17 ENCOUNTER — Encounter (HOSPITAL_COMMUNITY): Payer: Self-pay

## 2022-10-17 ENCOUNTER — Telehealth (HOSPITAL_COMMUNITY): Payer: Self-pay

## 2022-10-17 ENCOUNTER — Encounter (HOSPITAL_BASED_OUTPATIENT_CLINIC_OR_DEPARTMENT_OTHER): Payer: Self-pay | Admitting: Emergency Medicine

## 2022-10-17 ENCOUNTER — Other Ambulatory Visit: Payer: Self-pay

## 2022-10-17 ENCOUNTER — Inpatient Hospital Stay (HOSPITAL_BASED_OUTPATIENT_CLINIC_OR_DEPARTMENT_OTHER)
Admission: EM | Admit: 2022-10-17 | Discharge: 2022-10-19 | DRG: 683 | Disposition: A | Discharge status: Home / Self Care | Payer: Medicare HMO | Attending: Internal Medicine | Admitting: Internal Medicine

## 2022-10-17 DIAGNOSIS — J449 Chronic obstructive pulmonary disease, unspecified: Secondary | ICD-10-CM | POA: Diagnosis not present

## 2022-10-17 DIAGNOSIS — Z66 Do not resuscitate: Secondary | ICD-10-CM | POA: Diagnosis not present

## 2022-10-17 DIAGNOSIS — E1122 Type 2 diabetes mellitus with diabetic chronic kidney disease: Secondary | ICD-10-CM | POA: Diagnosis not present

## 2022-10-17 DIAGNOSIS — E1151 Type 2 diabetes mellitus with diabetic peripheral angiopathy without gangrene: Secondary | ICD-10-CM | POA: Diagnosis present

## 2022-10-17 DIAGNOSIS — Z888 Allergy status to other drugs, medicaments and biological substances status: Secondary | ICD-10-CM

## 2022-10-17 DIAGNOSIS — I5042 Chronic combined systolic (congestive) and diastolic (congestive) heart failure: Secondary | ICD-10-CM | POA: Diagnosis present

## 2022-10-17 DIAGNOSIS — Z87891 Personal history of nicotine dependence: Secondary | ICD-10-CM

## 2022-10-17 DIAGNOSIS — Z794 Long term (current) use of insulin: Secondary | ICD-10-CM | POA: Diagnosis not present

## 2022-10-17 DIAGNOSIS — Z7984 Long term (current) use of oral hypoglycemic drugs: Secondary | ICD-10-CM

## 2022-10-17 DIAGNOSIS — E785 Hyperlipidemia, unspecified: Secondary | ICD-10-CM | POA: Diagnosis present

## 2022-10-17 DIAGNOSIS — I130 Hypertensive heart and chronic kidney disease with heart failure and stage 1 through stage 4 chronic kidney disease, or unspecified chronic kidney disease: Secondary | ICD-10-CM | POA: Diagnosis present

## 2022-10-17 DIAGNOSIS — N1831 Chronic kidney disease, stage 3a: Secondary | ICD-10-CM | POA: Diagnosis present

## 2022-10-17 DIAGNOSIS — I4891 Unspecified atrial fibrillation: Secondary | ICD-10-CM | POA: Diagnosis not present

## 2022-10-17 DIAGNOSIS — J9611 Chronic respiratory failure with hypoxia: Secondary | ICD-10-CM | POA: Diagnosis not present

## 2022-10-17 DIAGNOSIS — E119 Type 2 diabetes mellitus without complications: Secondary | ICD-10-CM

## 2022-10-17 DIAGNOSIS — I4892 Unspecified atrial flutter: Secondary | ICD-10-CM | POA: Diagnosis not present

## 2022-10-17 DIAGNOSIS — Z882 Allergy status to sulfonamides status: Secondary | ICD-10-CM

## 2022-10-17 DIAGNOSIS — K219 Gastro-esophageal reflux disease without esophagitis: Secondary | ICD-10-CM | POA: Diagnosis present

## 2022-10-17 DIAGNOSIS — N179 Acute kidney failure, unspecified: Principal | ICD-10-CM | POA: Diagnosis present

## 2022-10-17 DIAGNOSIS — E1169 Type 2 diabetes mellitus with other specified complication: Secondary | ICD-10-CM | POA: Diagnosis present

## 2022-10-17 DIAGNOSIS — Z87442 Personal history of urinary calculi: Secondary | ICD-10-CM

## 2022-10-17 DIAGNOSIS — Z9981 Dependence on supplemental oxygen: Secondary | ICD-10-CM

## 2022-10-17 DIAGNOSIS — I2510 Atherosclerotic heart disease of native coronary artery without angina pectoris: Secondary | ICD-10-CM | POA: Diagnosis present

## 2022-10-17 DIAGNOSIS — E869 Volume depletion, unspecified: Secondary | ICD-10-CM | POA: Diagnosis present

## 2022-10-17 DIAGNOSIS — E875 Hyperkalemia: Secondary | ICD-10-CM | POA: Diagnosis not present

## 2022-10-17 DIAGNOSIS — Z951 Presence of aortocoronary bypass graft: Secondary | ICD-10-CM

## 2022-10-17 DIAGNOSIS — N189 Chronic kidney disease, unspecified: Secondary | ICD-10-CM | POA: Diagnosis present

## 2022-10-17 DIAGNOSIS — E1159 Type 2 diabetes mellitus with other circulatory complications: Secondary | ICD-10-CM | POA: Diagnosis present

## 2022-10-17 DIAGNOSIS — Z79899 Other long term (current) drug therapy: Secondary | ICD-10-CM | POA: Diagnosis not present

## 2022-10-17 DIAGNOSIS — I152 Hypertension secondary to endocrine disorders: Secondary | ICD-10-CM | POA: Diagnosis present

## 2022-10-17 DIAGNOSIS — Z833 Family history of diabetes mellitus: Secondary | ICD-10-CM

## 2022-10-17 DIAGNOSIS — Z885 Allergy status to narcotic agent status: Secondary | ICD-10-CM

## 2022-10-17 DIAGNOSIS — Z8249 Family history of ischemic heart disease and other diseases of the circulatory system: Secondary | ICD-10-CM

## 2022-10-17 DIAGNOSIS — Z7901 Long term (current) use of anticoagulants: Secondary | ICD-10-CM

## 2022-10-17 DIAGNOSIS — Z8546 Personal history of malignant neoplasm of prostate: Secondary | ICD-10-CM

## 2022-10-17 LAB — BASIC METABOLIC PANEL
Anion gap: 10 (ref 5–15)
Anion gap: 9 (ref 5–15)
BUN: 72 mg/dL — ABNORMAL HIGH (ref 8–23)
BUN: 70 mg/dL — ABNORMAL HIGH (ref 8–23)
CO2: 19 mmol/L — ABNORMAL LOW (ref 22–32)
CO2: 18 mmol/L — ABNORMAL LOW (ref 22–32)
Calcium: 9.7 mg/dL (ref 8.9–10.3)
Calcium: 9.7 mg/dL (ref 8.9–10.3)
Chloride: 106 mmol/L (ref 98–111)
Chloride: 109 mmol/L (ref 98–111)
Creatinine, Ser: 2.88 mg/dL — ABNORMAL HIGH (ref 0.61–1.24)
Creatinine, Ser: 2.84 mg/dL — ABNORMAL HIGH (ref 0.61–1.24)
GFR, Estimated: 22 mL/min — ABNORMAL LOW (ref >60)
GFR, Estimated: 22 mL/min — ABNORMAL LOW (ref >60)
Glucose, Bld: 163 mg/dL — ABNORMAL HIGH (ref 70–99)
Glucose, Bld: 94 mg/dL (ref 70–99)
Potassium: 6.6 mmol/L (ref 3.5–5.1)
Potassium: 6.3 mmol/L (ref 3.5–5.1)
Sodium: 135 mmol/L (ref 135–145)
Sodium: 136 mmol/L (ref 135–145)

## 2022-10-17 LAB — CBC WITH DIFFERENTIAL/PLATELET
Abs Immature Granulocytes: 0.01 10*3/uL (ref 0.00–0.07)
Basophils Absolute: 0.1 10*3/uL (ref 0.0–0.1)
Basophils Relative: 1 %
Eosinophils Absolute: 0.2 10*3/uL (ref 0.0–0.5)
Eosinophils Relative: 3 %
HCT: 42.9 % (ref 39.0–52.0)
Hemoglobin: 13 g/dL (ref 13.0–17.0)
Immature Granulocytes: 0 %
Lymphocytes Relative: 21 %
Lymphs Abs: 1.4 10*3/uL (ref 0.7–4.0)
MCH: 27.4 pg (ref 26.0–34.0)
MCHC: 30.3 g/dL (ref 30.0–36.0)
MCV: 90.3 fL (ref 80.0–100.0)
Monocytes Absolute: 0.5 10*3/uL (ref 0.1–1.0)
Monocytes Relative: 8 %
Neutro Abs: 4.5 10*3/uL (ref 1.7–7.7)
Neutrophils Relative %: 67 %
Platelets: 214 10*3/uL (ref 150–400)
RBC: 4.75 MIL/uL (ref 4.22–5.81)
RDW: 18.2 % — ABNORMAL HIGH (ref 11.5–15.5)
WBC: 6.7 10*3/uL (ref 4.0–10.5)
nRBC: 0 % (ref 0.0–0.2)

## 2022-10-17 LAB — GLUCOSE, CAPILLARY: Glucose-Capillary: 93 mg/dL (ref 70–99)

## 2022-10-17 LAB — CBG MONITORING, ED: Glucose-Capillary: 89 mg/dL (ref 70–99)

## 2022-10-17 MED ORDER — ACETAMINOPHEN 650 MG RE SUPP: 650.0000 mg | Freq: Four times a day (QID) | RECTAL | Status: DC | PRN

## 2022-10-17 MED ORDER — INSULIN ASPART 100 UNIT/ML IV SOLN
5.0000 [IU] | Freq: Once | INTRAVENOUS | Status: AC
  Administered 2022-10-17: 5 [IU] via INTRAVENOUS

## 2022-10-17 MED ORDER — APIXABAN 5 MG PO TABS
5.0000 mg | ORAL_TABLET | Freq: Two times a day (BID) | ORAL | Status: DC
  Administered 2022-10-18 – 2022-10-19 (×4): 5 mg via ORAL
  Filled 2022-10-17 (×4): qty 1

## 2022-10-17 MED ORDER — DEXTROSE 50 % IV SOLN
1.0000 | Freq: Once | INTRAVENOUS | Status: AC
  Administered 2022-10-17: 50 mL via INTRAVENOUS
  Filled 2022-10-17: qty 50

## 2022-10-17 MED ORDER — SODIUM ZIRCONIUM CYCLOSILICATE 10 G PO PACK
10.0000 g | PACK | Freq: Once | ORAL | Status: AC
  Administered 2022-10-17: 10 g via ORAL
  Filled 2022-10-17: qty 1

## 2022-10-17 MED ORDER — ACETAMINOPHEN 325 MG PO TABS: 650.0000 mg | ORAL_TABLET | Freq: Four times a day (QID) | ORAL | Status: DC | PRN

## 2022-10-17 MED ORDER — DEXTROSE 50 % IV SOLN
1.0000 | Freq: Once | INTRAVENOUS | Status: AC
  Administered 2022-10-18: 50 mL via INTRAVENOUS
  Filled 2022-10-17: qty 50

## 2022-10-17 MED ORDER — INSULIN ASPART 100 UNIT/ML IV SOLN
5.0000 [IU] | Freq: Once | INTRAVENOUS | Status: AC
  Administered 2022-10-18: 5 [IU] via INTRAVENOUS

## 2022-10-17 MED ORDER — CALCIUM GLUCONATE-NACL 1-0.675 GM/50ML-% IV SOLN
1.0000 g | Freq: Once | INTRAVENOUS | Status: AC
  Administered 2022-10-17: 1000 mg via INTRAVENOUS
  Filled 2022-10-17: qty 50

## 2022-10-17 MED ORDER — ROSUVASTATIN CALCIUM 20 MG PO TABS
20.0000 mg | ORAL_TABLET | Freq: Every day | ORAL | Status: DC
  Administered 2022-10-18 – 2022-10-19 (×2): 20 mg via ORAL
  Filled 2022-10-17 (×2): qty 1

## 2022-10-17 MED ORDER — FUROSEMIDE 10 MG/ML IJ SOLN
40.0000 mg | Freq: Once | INTRAMUSCULAR | Status: AC
  Administered 2022-10-17: 40 mg via INTRAVENOUS
  Filled 2022-10-17: qty 4

## 2022-10-17 MED ORDER — SODIUM CHLORIDE 0.9 % IV BOLUS
500.0000 mL | Freq: Once | INTRAVENOUS | Status: AC
  Administered 2022-10-17: 500 mL via INTRAVENOUS

## 2022-10-17 MED ORDER — ALBUTEROL SULFATE (2.5 MG/3ML) 0.083% IN NEBU: 2.5000 mg | INHALATION_SOLUTION | Freq: Four times a day (QID) | RESPIRATORY_TRACT | Status: DC | PRN

## 2022-10-17 MED ORDER — INSULIN ASPART 100 UNIT/ML IJ SOLN
0.0000 [IU] | Freq: Three times a day (TID) | INTRAMUSCULAR | Status: DC
  Administered 2022-10-18: 5 [IU] via SUBCUTANEOUS
  Administered 2022-10-18: 1 [IU] via SUBCUTANEOUS

## 2022-10-17 MED ORDER — SODIUM CHLORIDE 0.9 % IV SOLN: Freq: Once | INTRAVENOUS | Status: DC

## 2022-10-17 MED ORDER — SODIUM ZIRCONIUM CYCLOSILICATE 10 G PO PACK
10.0000 g | PACK | Freq: Two times a day (BID) | ORAL | Status: AC
  Administered 2022-10-18 (×2): 10 g via ORAL
  Filled 2022-10-17 (×2): qty 1

## 2022-10-17 NOTE — Assessment & Plan Note (Signed)
Hold metformin.  Placed on SSI.

## 2022-10-17 NOTE — Assessment & Plan Note (Addendum)
Recently diagnosed and started on amiodarone and Eliquis.  Plan for TEE/DCCV on 12/27 with Dr. Haroldine Laws. -Continue Eliquis -Hold amiodarone for now

## 2022-10-17 NOTE — Assessment & Plan Note (Addendum)
Appears euvolemic.  Last EF 30-25 %. -Holding benazepril, eplerenone in setting of hyperkalemia -Holding Farxiga, Lasix with AKI -Not on beta-blocker, presumably due to history of bradycardia

## 2022-10-17 NOTE — Telephone Encounter (Signed)
I spoke with patient and instructed him to get his daughter to take him to the ED asap. He said he will call her and have her drive him there. He will go to [facility] since it is close. He was also instructed not to take any medications.

## 2022-10-17 NOTE — ED Triage Notes (Signed)
Pt arrives to ED with c/o critical lab values. Pt reports labs drawn yesterday by cardiology which showed a K level >7.5 and increased creatinine.

## 2022-10-17 NOTE — ED Notes (Signed)
18:27 [name] at [facility] will send transport.-ABB (NS)

## 2022-10-17 NOTE — Assessment & Plan Note (Signed)
S/p CABG.  Denies any chest pain.  Continue rosuvastatin and Eliquis.

## 2022-10-17 NOTE — Assessment & Plan Note (Signed)
Potassium >7.5 on outpatient labs 12/21.  Repeat is 6.6 today in the ED.  Received temporizing measures with Lasix, NovoLog, Lokelma, calcium gluconate. -Stat BMET now -Hold benazepril and eplerenone -Keep on telemetry

## 2022-10-17 NOTE — Assessment & Plan Note (Signed)
Chronic respiratory failure with hypoxia Stable on home 2 L O2, no wheezing on admission.  Continue albuterol as needed.

## 2022-10-17 NOTE — Assessment & Plan Note (Signed)
Has been having progressively worsening renal function over this past year.  Baseline creatinine was 1-1.3 earlier in the year.  Creatinine is 2.88 on admit.  Appears euvolemic. -Hold benazepril, eplerenone, Lasix, metformin, Wilder Glade

## 2022-10-17 NOTE — Progress Notes (Signed)
Pt's K+ 6.3 & Posey Pronto, MD notified. See new orders.  Elaina Hoops, RN

## 2022-10-17 NOTE — Plan of Care (Signed)
Repeat potassium is 6.3.  Will order Lokelma 10 g x 2 doses, 5 units NovoLog with amp D50, recheck CBG in 1 hour.  Repeat BMP 5 AM.

## 2022-10-17 NOTE — Hospital Course (Signed)
[name] is a 76 y.o. male with medical history significant for chronic combined systolic and diastolic CHF (EF 33-54% on 09/21/2022), CAD s/p CABG, recently diagnosed atrial flutter on Eliquis, COPD, chronic respiratory failure with hypoxia on 2 L O2 via [place], PAD, CKD stage III, carotid artery disease, T2DM, HTN, HLD who is admitted with hyperkalemia.

## 2022-10-17 NOTE — ED Provider Notes (Signed)
[facility] EMERGENCY DEPT Provider Note   CSN: [id number] Arrival date & time: 10/17/22  1446     History  Chief Complaint  Patient presents with   Abnormal Lab    [name] is a 76 y.o. male.  Patient is a 76 year old male with a past medical history of hypertension, CAD, CHF, A-fib on Eliquis presenting to the emergency department with abnormal labs.  The patient states that he saw his cardiologist last week and had labs performed and was called today that his potassium and creatinine were abnormal.  The patient states that he did have some recent medication changes and was started on amiodarone and Eliquis for his new diagnosis of A-fib.  He denies any chest pain, shortness of breath, heart palpitations or lightheadedness.  He denies any nausea, vomiting or diarrhea.  He states that he has been taking his medications as prescribed.  He states he has been urinating in a more number of times per day.  His wife did note that his blood pressure was lower than normal the last couple of days.  He states he is planned for a TEE cardioversion on Tuesday with cardiology at [facility].  The history is provided by the patient and the spouse.  Abnormal Lab      Home Medications Prior to Admission medications   Medication Sig Start Date End Date Taking? Authorizing Provider  acetaminophen (TYLENOL) 325 MG tablet Take 650 mg by mouth every 6 (six) hours as needed for mild pain or moderate pain.   Yes [provider]  albuterol (VENTOLIN HFA) 108 (90 Base) MCG/ACT inhaler Inhale 2 puffs into the lungs every 6 (six) hours as needed for wheezing or shortness of breath. 07/02/21  Yes [provider]  amiodarone (PACERONE) 200 MG tablet Take 1 tablet (200 mg total) by mouth 2 (two) times daily for 28 days. Start 1 tablet twice a day for 2 weeks from  10/07/2022 to 10/20/2022 10/07/22 11/04/22 Yes Milford, Maricela Bo, FNP  amLODipine-benazepril (LOTREL) 10-40 MG capsule Take  1 capsule by mouth once daily 02/20/22  Yes Nahser, Wonda Cheng, MD  apixaban (ELIQUIS) 5 MG TABS tablet Take 1 tablet (5 mg total) by mouth 2 (two) times daily. 10/07/22  Yes Milford, Maricela Bo, FNP  Cholecalciferol (VITAMIN D-3) 125 MCG (5000 UT) TABS Take 5,000 Units by mouth daily.   Yes [provider]  dapagliflozin propanediol (FARXIGA) 10 MG TABS tablet Take 1 tablet (10 mg total) by mouth daily. 10/07/22  Yes Milford, Maricela Bo, FNP  eplerenone (INSPRA) 50 MG tablet Take 25 mg by mouth daily.   Yes [provider]  furosemide (LASIX) 40 MG tablet Take 40 mg by mouth daily.   Yes [provider]  insulin NPH Human (NOVOLIN N) 100 UNIT/ML injection Inject 35-40 Units into the skin See admin instructions. Inject 40 units into the skin in the morning and 35 units at night 09/08/19  Yes [provider]  insulin regular (NOVOLIN R) 100 units/mL injection Inject 6-15 Units into the skin 3 (three) times daily before meals. Per sliding scale 09/08/19  Yes [provider]  metFORMIN (GLUCOPHAGE-XR) 500 MG 24 hr tablet Take 500 mg by mouth 2 (two) times daily with a meal.  06/16/13  Yes [provider]  nitroGLYCERIN (NITROSTAT) 0.4 MG SL tablet Place 1 tablet (0.4 mg total) under the tongue every 5 (five) minutes as needed for chest pain. 08/31/20  Yes Nahser, Wonda Cheng, MD  pantoprazole ([place])  20 MG tablet Take 1 tablet by mouth once daily 06/10/22  Yes Nahser, Wonda Cheng, MD  rosuvastatin (CRESTOR) 20 MG tablet Take 1 tablet by mouth once daily Patient taking differently: Take 20 mg by mouth daily. 08/18/22  Yes Nahser, Wonda Cheng, MD  amiodarone (PACERONE) 200 MG tablet Take 1 tablet (200 mg total) by mouth daily. Start 10/21/2022 10/07/22   Rafael Bihari, FNP      Allergies    Aldactone [spironolactone], Lipitor [atorvastatin calcium], Sulfa drugs cross reactors, Sulfamethoxazole-trimethoprim, and Codeine    Review of Systems   Review of  Systems  Physical Exam Updated Vital Signs BP (!) 129/94 (BP Location: Left Arm)   Pulse 77   Temp 98 F (36.7 C) (Oral)   Resp 18   Ht '5\' 11"'$  (1.803 m)   Wt 76.2 kg   SpO2 97%   BMI 23.45 kg/m  Physical Exam Vitals and nursing note reviewed.  Constitutional:      General: He is not in acute distress.    Appearance: Normal appearance.  HENT:     Head: Normocephalic and atraumatic.     Nose: Nose normal.     Mouth/Throat:     Mouth: Mucous membranes are moist.     Pharynx: Oropharynx is clear.  Eyes:     Extraocular Movements: Extraocular movements intact.     Conjunctiva/sclera: Conjunctivae normal.  Cardiovascular:     Rate and Rhythm: Normal rate. Rhythm irregular.     Pulses: Normal pulses.     Heart sounds: Normal heart sounds.  Pulmonary:     Effort: Pulmonary effort is normal.     Breath sounds: Normal breath sounds.  Abdominal:     General: Abdomen is flat.     Palpations: Abdomen is soft.     Tenderness: There is no abdominal tenderness.  Musculoskeletal:        General: Normal range of motion.     Cervical back: Normal range of motion and neck supple.  Skin:    General: Skin is warm and dry.  Neurological:     General: No focal deficit present.     Mental Status: He is alert and oriented to person, place, and time.  Psychiatric:        Mood and Affect: Mood normal.        Behavior: Behavior normal.     ED Results / Procedures / Treatments   Labs (all labs ordered are listed, but only abnormal results are displayed) Labs Reviewed  CBC WITH DIFFERENTIAL/PLATELET - Abnormal; Notable for the following components:      Result Value   RDW 18.2 (*)    All other components within normal limits  BASIC METABOLIC PANEL - Abnormal; Notable for the following components:   Potassium 6.6 (*)    CO2 19 (*)    Glucose, Bld 163 (*)    BUN 72 (*)    Creatinine, Ser 2.88 (*)    GFR, Estimated 22 (*)    All other components within normal limits  BASIC  METABOLIC PANEL - Abnormal; Notable for the following components:   Potassium 6.3 (*)    CO2 18 (*)    BUN 70 (*)    Creatinine, Ser 2.84 (*)    GFR, Estimated 22 (*)    All other components within normal limits  GLUCOSE, CAPILLARY  CBC  BASIC METABOLIC PANEL  CBG MONITORING, ED    EKG EKG Interpretation  Date/Time:  Friday October 17 2022 15:09:14 EST  Ventricular Rate:  75 PR Interval:  294 QRS Duration: 116 QT Interval:  380 QTC Calculation: 424 R Axis:   90 Text Interpretation: Unusual P axis, possible ectopic atrial rhythm with Premature atrial complexes Rightward axis Low voltage QRS Cannot rule out Anterior infarct , age undetermined ST & T wave abnormality, consider inferior ischemia Abnormal ECG When compared with ECG of 07-Oct-2022 09:55, Significant changes have occurred Confirmed by Leanord Asal ([id number]) on 10/17/2022 3:44:04 PM  Radiology No results found.  Procedures Procedures    Medications Ordered in ED Medications  insulin aspart (novoLOG) injection 0-9 Units (has no administration in time range)  acetaminophen (TYLENOL) tablet 650 mg (has no administration in time range)    Or  acetaminophen (TYLENOL) suppository 650 mg (has no administration in time range)  albuterol (PROVENTIL) (2.5 MG/3ML) 0.083% nebulizer solution 2.5 mg (has no administration in time range)  apixaban (ELIQUIS) tablet 5 mg (has no administration in time range)  rosuvastatin (CRESTOR) tablet 20 mg (has no administration in time range)  sodium zirconium cyclosilicate (LOKELMA) packet 10 g (has no administration in time range)  insulin aspart (novoLOG) injection 5 Units (has no administration in time range)    And  dextrose 50 % solution 50 mL (has no administration in time range)  furosemide (LASIX) injection 40 mg (40 mg Intravenous Given 10/17/22 1614)  sodium chloride 0.9 % bolus 500 mL (500 mLs Intravenous New Bag/Given 10/17/22 1622)  sodium zirconium cyclosilicate (LOKELMA)  packet 10 g (10 g Oral Given 10/17/22 1622)  calcium gluconate 1 g/ 50 mL sodium chloride IVPB (0 mg Intravenous Stopped 10/17/22 1638)  insulin aspart (novoLOG) injection 5 Units (5 Units Intravenous Given 10/17/22 1618)    And  dextrose 50 % solution 50 mL (50 mLs Intravenous Given 10/17/22 1616)    ED Course/ Medical Decision Making/ A&P                           Medical Decision Making This patient presents to the ED with chief complaint(s) of abnormal labs with pertinent past medical history of CAD, CHF, A-fib on Eliquis which further complicates the presenting complaint. The complaint involves an extensive differential diagnosis and also carries with it a high risk of complications and morbidity.    The differential diagnosis includes hyperkalemia, lab error, AKI, acute renal failure, arrhythmia  Additional history obtained: Additional history obtained from spouse Records reviewed outpatient cardiology records  ED Course and Reassessment: Patient was initially evaluated by triage and had labs and EKG performed.  EKG showed rate controlled A-fib without any hyperkalemic changes.  Repeat labs here did show hyperkalemia with a potassium of 6.6 as well as an AKI with a creatinine of 2.8 from baseline of around 2.  The patient was given gentle fluids and temporization with calcium, insulin, dextrose, Lasix and Lokelma.  Patient will require admission for further management of his hyperkalemia in the setting of AKI.  Independent labs interpretation:  The following labs were independently interpreted: Hyperkalemia, AKI  Independent visualization of imaging: N/A  Consultation: - Consulted or discussed management/test interpretation w/ external professional: Hospitalists  Consideration for admission or further workup: Patient requires admission for further management Social Determinants of health: N/A    Amount and/or Complexity of Data Reviewed Labs: ordered.  Risk OTC  drugs. Prescription drug management. Decision regarding hospitalization.          Final Clinical Impression(s) / ED Diagnoses Final diagnoses:  Hyperkalemia  AKI (acute kidney injury) [facility])    Rx / [place] Orders ED Discharge Orders     None         Kemper Durie, DO 10/17/22 2355

## 2022-10-17 NOTE — H&P (Signed)
History and Physical    [name] TDV:[id number] DOB: Oct 17, 1946 DOA: 10/17/2022  PCP: Haywood Pao, MD  Patient coming from: Home  I have personally briefly reviewed patient's old medical records in [place]  Chief Complaint: Hyperkalemia  HPI: [name] is a 76 y.o. male with medical history significant for chronic combined systolic and diastolic CHF (EF 06-26% on 09/21/2022), CAD s/p CABG, recently diagnosed atrial flutter on Eliquis, COPD, chronic respiratory failure with hypoxia on 2 L O2 via [place], PAD, CKD stage III, carotid artery disease, T2DM, HTN, HLD who presented to the ED for evaluation of hyperkalemia.  Patient was seen in cardiology clinic on 10/07/2022 for follow-up after hospital admission for acute on chronic CHF.  EKG showed new finding of atrial flutter.  He was started on Eliquis and Plavix was discontinued.  He was also started on amiodarone 200 mg bid x 2 weeks with plan to decrease to 200 mg daily afterwards.  He was arranged for TEE/DCCV with Dr. Haroldine Laws scheduled for 12/27.  He had blood work drawn but unfortunately was not processed at that time.  He was able to get his blood work drawn yesterday and was called with emergent results of potassium >7.5 today.  He was advised to present to the ED immediately.  Patient states that he has otherwise been feeling well.  He has not had any chest pain, palpitations, dyspnea, nausea, vomit, abdominal pain, dysuria, or peripheral edema.  He does not take any potassium supplements.  His home meds include amlodipine-benazepril 10-40 mg, eplerenone 25 mg, Farxiga 10 mg, metformin 500 mg, Lasix 40 mg.  [facility] ED Course  Labs/Imaging on admission: I have personally reviewed following labs and imaging studies.  Initial vitals showed BP 94/60, pulse 80, RR 20, temp 97.7 F, SpO2 100% on 2 L O2 via [place].  Labs showed potassium 6.6, BUN 72, creatinine 2.88, sodium 135, bicarb 19, serum glucose 163,  WBC 6.7, hemoglobin 13.0, platelets 214,000.  Patient was given 500 cc normal saline, IV Lasix 40 mg, 10 g Lokelma, 5 units NovoLog with amp D50.  The hospitalist service was consulted to admit for further evaluation and management.  Review of Systems: All systems reviewed and are negative except as documented in history of present illness above.   Past Medical History:  Diagnosis Date   Arthritis    CAD (coronary artery disease)    Carotid artery occlusion    Diabetes mellitus    fasting 100-120s   Dizziness    Dysrhythmia    skips beats   GERD (gastroesophageal reflux disease)    History of kidney stones    Hyperlipidemia    Hypertension    IHD (ischemic heart disease)    Prior CABG in 2003   Normal nuclear stress test June 2012   Peripheral vascular disease ([place])    Prostate cancer ([place])    Torn rotator cuff     Past Surgical History:  Procedure Laterality Date   ABDOMINAL AORTAGRAM N/A 12/21/2013   Procedure: ABDOMINAL Maxcine Ham;  Surgeon: Elam Dutch, MD;  Location: [facility] C[facility] LAB;  Service: Cardiovascular;  Laterality: N/A;   ABDOMINAL AORTOGRAM N/A 11/13/2017   Procedure: ABDOMINAL AORTOGRAM;  Surgeon: Elam Dutch, MD;  Location: [place] CV LAB;  Service: Cardiovascular;  Laterality: N/A;   APPENDECTOMY     ARCH AORTOGRAM  08/26/2013   Procedure: ARCH AORTOGRAM;  Surgeon: Elam Dutch, MD;  Location: [facility] CATH LAB;  Service: Cardiovascular;;  CARDIAC CATHETERIZATION  05/03/2002   EF 40-45%   CARDIOVASCULAR STRESS TEST  08/10/2006   EF 65%   CAROTID ENDARTERECTOMY     CAROTID STENT INSERTION Left 08/26/2013   Procedure: CAROTID STENT INSERTION;  Surgeon: Elam Dutch, MD;  Location: [facility] CATH LAB;  Service: Cardiovascular;  Laterality: Left;  internal carotid   CORONARY ARTERY BYPASS GRAFT  04/2002   DOPPLER ECHOCARDIOGRAPHY  08/12/2002   EF 80-85%   ENDARTERECTOMY Left 07/20/2013   Procedure: ATTEMPTED ENDARTERECTOMY CAROTID;  Surgeon: Conrad Morocco,  MD;  Location: [facility];  Service: Vascular;  Laterality: Left;   HERNIA REPAIR     LOWER EXTREMITY ANGIOGRAPHY  11/13/2017   Procedure: Lower Extremity Angiography;  Surgeon: Elam Dutch, MD;  Location: [facility] CV LAB;  Service: Cardiovascular;;   PERIPHERAL VASCULAR INTERVENTION Right 11/13/2017   Procedure: PERIPHERAL VASCULAR INTERVENTION;  Surgeon: Elam Dutch, MD;  Location: [place] [facility] CV LAB;  Service: Cardiovascular;  Laterality: Right;  superficial femoral   RIGHT HEART CATH N/A 09/23/2022   Procedure: RIGHT HEART CATH;  Surgeon: Jolaine Artist, MD;  Location: [facility] CV LAB;  Service: Cardiovascular;  Laterality: N/A;   SHOULDER SURGERY      Social History:  reports that he quit smoking about 20 years ago. His smoking use included cigarettes. He has a 20.00 pack-year smoking history. He has never been exposed to tobacco smoke. He has never used smokeless tobacco. He reports that he does not drink alcohol and does not use drugs.  Allergies  Allergen Reactions   Aldactone [Spironolactone] Other (See Comments)    Swollen breast   Lipitor [Atorvastatin Calcium] Other (See Comments)    Muscle ache   Sulfa Drugs Cross Reactors Other (See Comments)    "raw spots"   Sulfamethoxazole-Trimethoprim     Other reaction(s): Rash   Codeine Rash    Family History  Problem Relation Age of Onset   Heart attack Mother    Diabetes Mother    Heart disease Mother    Hypertension Mother    Heart attack Father    Heart disease Father    Hypertension Father    Diabetes Brother    Diabetes Sister    Diabetes Daughter    Heart disease Daughter    Hypertension Daughter    Diabetes Sister    Breast cancer Neg Hx    Prostate cancer Neg Hx    Colon cancer Neg Hx    Pancreatic cancer Neg Hx      Prior to Admission medications   Medication Sig Start Date End Date Taking? Authorizing Provider  acetaminophen (TYLENOL) 325 MG tablet Take 650 mg by mouth every 6 (six)  hours as needed for mild pain or moderate pain.   Yes [provider]  albuterol (VENTOLIN HFA) 108 (90 Base) MCG/ACT inhaler Inhale 2 puffs into the lungs every 6 (six) hours as needed for wheezing or shortness of breath. 07/02/21  Yes [provider]  amiodarone (PACERONE) 200 MG tablet Take 1 tablet (200 mg total) by mouth 2 (two) times daily for 28 days. Start 1 tablet twice a day for 2 weeks from  10/07/2022 to 10/20/2022 10/07/22 11/04/22 Yes Milford, Maricela Bo, FNP  amLODipine-benazepril (LOTREL) 10-40 MG capsule Take 1 capsule by mouth once daily 02/20/22  Yes Nahser, Wonda Cheng, MD  apixaban (ELIQUIS) 5 MG TABS tablet Take 1 tablet (5 mg total) by mouth 2 (two) times daily. 10/07/22  Yes [place]ford, Maricela Bo, FNP  Cholecalciferol (VITAMIN D-3) 125 MCG (5000 UT) TABS Take 5,000 Units by mouth daily.   Yes [provider]  dapagliflozin propanediol (FARXIGA) 10 MG TABS tablet Take 1 tablet (10 mg total) by mouth daily. 10/07/22  Yes [place]ford, Maricela Bo, FNP  eplerenone (INSPRA) 50 MG tablet Take 25 mg by mouth daily.   Yes [provider]  furosemide (LASIX) 40 MG tablet Take 40 mg by mouth daily.   Yes [provider]  metFORMIN (GLUCOPHAGE-XR) 500 MG 24 hr tablet Take 500 mg by mouth 2 (two) times daily with a meal.  06/16/13  Yes [provider]  pantoprazole (PROTONIX) 20 MG tablet Take 1 tablet by mouth once daily 06/10/22  Yes Nahser, Wonda Cheng, MD  rosuvastatin (CRESTOR) 20 MG tablet Take 1 tablet by mouth once daily Patient taking differently: Take 20 mg by mouth daily. 08/18/22  Yes Nahser, Wonda Cheng, MD  amiodarone (PACERONE) 200 MG tablet Take 1 tablet (200 mg total) by mouth daily. Start 10/21/2022 10/07/22   Rafael Bihari, FNP  insulin NPH Human (NOVOLIN N) 100 UNIT/ML injection Inject 35-40 Units into the skin See admin instructions. Inject 40 units into the skin in the morning and 35 units at night 09/08/19   [provider]   insulin regular (NOVOLIN R) 100 units/mL injection Inject 6-15 Units into the skin 3 (three) times daily before meals. Per sliding scale 09/08/19   [provider]  nitroGLYCERIN (NITROSTAT) 0.4 MG SL tablet Place 1 tablet (0.4 mg total) under the tongue every 5 (five) minutes as needed for chest pain. 08/31/20   Nahser, Wonda Cheng, MD    Physical Exam: Vitals:   10/17/22 1533 10/17/22 1600 10/17/22 1630 10/17/22 2018  BP: 95/63 (!) 94/51 (!) 125/50 (!) 129/94  Pulse: 63 74 61 77  Resp: (!) 26 (!) '25 18 18  '$ Temp:    98 F (36.7 C)  TempSrc:    Oral  SpO2: 100% 100% 100% 97%  Weight:    76.2 kg  Height:    '5\' 11"'$  (1.803 m)   Constitutional: Resting in bed, NAD, calm, comfortable Eyes: EOMI, lids and conjunctivae normal ENMT: Mucous membranes are moist. Posterior pharynx clear of any exudate or lesions.Normal dentition.  Neck: normal, supple, no masses. Respiratory: clear to auscultation bilaterally, no wheezing, no crackles. Normal respiratory effort. No accessory muscle use.  Cardiovascular: Irregularly irregular, no murmurs / rubs / gallops. No extremity edema. 2+ pedal pulses. Abdomen: no tenderness, no masses palpated.  Musculoskeletal: no clubbing / cyanosis. No joint deformity upper and lower extremities. Good ROM, no contractures. Normal muscle tone.  Skin: no rashes, lesions, ulcers. No induration Neurologic: Sensation intact. Strength 5/5 in all 4.  Psychiatric: Normal judgment and insight. Alert and oriented x 3. Normal mood.   EKG: Personally reviewed. Unclear rhythm, possibly ectopic atrial rhythm with PACs or atrial fib/flutter, low voltage, nonspecific IVCD.  Previous EKG on 10/07/2022 showed atrial flutter with variable AV block.  Assessment/Plan Principal Problem:   Hyperkalemia Active Problems:   Acute renal failure superimposed on stage 3a chronic kidney disease (HCC)   Chronic combined systolic and diastolic CHF (congestive heart failure) (HCC)   Atrial  flutter (HCC)   Insulin dependent type 2 diabetes mellitus (HCC)   CAD (coronary artery disease)   Hypertension associated with diabetes ([place])   Hyperlipidemia associated with type 2 diabetes mellitus (HCC)   COPD (chronic obstructive pulmonary disease) (HCC)   [name] is a 76 y.o. male  with medical history significant for chronic combined systolic and diastolic CHF (EF 02-58% on 09/21/2022), CAD s/p CABG, recently diagnosed atrial flutter on Eliquis, COPD, chronic respiratory failure with hypoxia on 2 L O2 via [place], PAD, CKD stage III, carotid artery disease, T2DM, HTN, HLD who is admitted with hyperkalemia.  Assessment and Plan: * Hyperkalemia Potassium >7.5 on outpatient labs 12/21.  Repeat is 6.6 today in the ED.  Received temporizing measures with Lasix, NovoLog, Lokelma, calcium gluconate. -Stat BMET now -Hold benazepril and eplerenone -Keep on telemetry  Acute renal failure superimposed on stage 3a chronic kidney disease ([place]) Has been having progressively worsening renal function over this past year.  Baseline creatinine was 1-1.3 earlier in the year.  Creatinine is 2.88 on admit.  Appears euvolemic. -Hold benazepril, eplerenone, Lasix, metformin, Farxiga  Chronic combined systolic and diastolic CHF (congestive heart failure) (HCC) Appears euvolemic.  Last EF 30-25 %. -Holding benazepril, eplerenone in setting of hyperkalemia -Holding Farxiga, Lasix with AKI -Not on beta-blocker, presumably due to history of bradycardia  Atrial flutter ([place]) Recently diagnosed and started on amiodarone and Eliquis.  Plan for TEE/DCCV on 12/27 with Dr. Haroldine Laws. -Continue Eliquis -Hold amiodarone for now  Insulin dependent type 2 diabetes mellitus ([place]) Hold metformin.  Placed on SSI.  COPD (chronic obstructive pulmonary disease) (HCC) Chronic respiratory failure with hypoxia Stable on home 2 L O2, no wheezing on admission.  Continue albuterol as needed.  Hyperlipidemia associated  with type 2 diabetes mellitus (HCC) Continue rosuvastatin.  Hypertension associated with diabetes ([place]) Hold benazepril and eplerenone in setting of hyperkalemia.  Continue amlodipine.  CAD (coronary artery disease) S/p CABG.  Denies any chest pain.  Continue rosuvastatin and Eliquis.  DVT prophylaxis:  apixaban (ELIQUIS) tablet 5 mg   Code Status: DNR, confirmed with patient on admission Family Communication: Daughter at bedside Disposition Plan: From home, dispo pending correction of electrolyte abnormalities Consults called: None Severity of Illness: The appropriate patient status for this patient is OBSERVATION. Observation status is judged to be reasonable and necessary in order to provide the required intensity of service to ensure the patient's safety. The patient's presenting symptoms, physical exam findings, and initial radiographic and laboratory data in the context of their medical condition is felt to place them at decreased risk for further clinical deterioration. Furthermore, it is anticipated that the patient will be medically stable for discharge from the hospital within 2 midnights of admission.   Zada Finders MD [facility]  If 7PM-7AM, please contact night-coverage [email or web address]  10/17/2022, 10:26 PM

## 2022-10-17 NOTE — Assessment & Plan Note (Signed)
Continue rosuvastatin.  

## 2022-10-17 NOTE — Assessment & Plan Note (Signed)
Hold benazepril and eplerenone in setting of hyperkalemia.  Continue amlodipine.

## 2022-10-18 ENCOUNTER — Observation Stay (HOSPITAL_COMMUNITY): Payer: Medicare HMO

## 2022-10-18 DIAGNOSIS — N281 Cyst of kidney, acquired: Secondary | ICD-10-CM | POA: Diagnosis not present

## 2022-10-18 DIAGNOSIS — E875 Hyperkalemia: Secondary | ICD-10-CM | POA: Diagnosis not present

## 2022-10-18 DIAGNOSIS — N179 Acute kidney failure, unspecified: Secondary | ICD-10-CM | POA: Diagnosis not present

## 2022-10-18 LAB — CBC
HCT: 37.3 % — ABNORMAL LOW (ref 39.0–52.0)
Hemoglobin: 11.8 g/dL — ABNORMAL LOW (ref 13.0–17.0)
MCH: 28 pg (ref 26.0–34.0)
MCHC: 31.6 g/dL (ref 30.0–36.0)
MCV: 88.6 fL (ref 80.0–100.0)
Platelets: 186 10*3/uL (ref 150–400)
RBC: 4.21 MIL/uL — ABNORMAL LOW (ref 4.22–5.81)
RDW: 18 % — ABNORMAL HIGH (ref 11.5–15.5)
WBC: 7.3 10*3/uL (ref 4.0–10.5)
nRBC: 0 % (ref 0.0–0.2)

## 2022-10-18 LAB — BASIC METABOLIC PANEL
Anion gap: 7 (ref 5–15)
BUN: 68 mg/dL — ABNORMAL HIGH (ref 8–23)
CO2: 19 mmol/L — ABNORMAL LOW (ref 22–32)
Calcium: 9 mg/dL (ref 8.9–10.3)
Chloride: 109 mmol/L (ref 98–111)
Creatinine, Ser: 2.79 mg/dL — ABNORMAL HIGH (ref 0.61–1.24)
GFR, Estimated: 23 mL/min — ABNORMAL LOW (ref >60)
Glucose, Bld: 257 mg/dL — ABNORMAL HIGH (ref 70–99)
Potassium: 5.2 mmol/L — ABNORMAL HIGH (ref 3.5–5.1)
Sodium: 135 mmol/L (ref 135–145)

## 2022-10-18 LAB — RENAL FUNCTION PANEL
Albumin: 3.8 g/dL (ref 3.5–5.0)
Anion gap: 7 (ref 5–15)
BUN: 56 mg/dL — ABNORMAL HIGH (ref 8–23)
CO2: 24 mmol/L (ref 22–32)
Calcium: 9.7 mg/dL (ref 8.9–10.3)
Chloride: 111 mmol/L (ref 98–111)
Creatinine, Ser: 2.9 mg/dL — ABNORMAL HIGH (ref 0.61–1.24)
GFR, Estimated: 22 mL/min — ABNORMAL LOW (ref >60)
Glucose, Bld: 141 mg/dL — ABNORMAL HIGH (ref 70–99)
Phosphorus: 4.3 mg/dL (ref 2.5–4.6)
Potassium: 5.5 mmol/L — ABNORMAL HIGH (ref 3.5–5.1)
Sodium: 142 mmol/L (ref 135–145)

## 2022-10-18 LAB — GLUCOSE, CAPILLARY
Glucose-Capillary: 284 mg/dL — ABNORMAL HIGH (ref 70–99)
Glucose-Capillary: 105 mg/dL — ABNORMAL HIGH (ref 70–99)
Glucose-Capillary: 267 mg/dL — ABNORMAL HIGH (ref 70–99)
Glucose-Capillary: 138 mg/dL — ABNORMAL HIGH (ref 70–99)
Glucose-Capillary: 159 mg/dL — ABNORMAL HIGH (ref 70–99)

## 2022-10-18 LAB — MAGNESIUM: Magnesium: 1.9 mg/dL (ref 1.7–2.4)

## 2022-10-18 MED ORDER — SODIUM CHLORIDE 0.9 % IV SOLN: INTRAVENOUS | Status: AC

## 2022-10-18 MED ORDER — SODIUM ZIRCONIUM CYCLOSILICATE 10 G PO PACK
10.0000 g | PACK | Freq: Two times a day (BID) | ORAL | Status: DC
  Administered 2022-10-19 (×2): 10 g via ORAL
  Filled 2022-10-18 (×3): qty 1

## 2022-10-18 NOTE — Progress Notes (Signed)
Spoke to in [employer]. Confirmed the most recent sample resulted at 1607. Will update Dr Marthenia Rolling via secure chat.

## 2022-10-18 NOTE — Progress Notes (Signed)
PROGRESS NOTE    [name]  MEQ:[id number] DOB: May 28, 1946 DOA: 10/17/2022 PCP: Haywood Pao, MD  Outpatient Specialists:     Brief Narrative:  As per H&P done earlier: "[name] is a 76 y.o. male with medical history significant for chronic combined systolic and diastolic CHF (EF 29-79% on 09/21/2022), CAD s/p CABG, recently diagnosed atrial flutter on Eliquis, COPD, chronic respiratory failure with hypoxia on 2 L O2 via [place], PAD, CKD stage III, carotid artery disease, T2DM, HTN, HLD who presented to the ED for evaluation of hyperkalemia.   Patient was seen in cardiology clinic on 10/07/2022 for follow-up after hospital admission for acute on chronic CHF.  EKG showed new finding of atrial flutter.  He was started on Eliquis and Plavix was discontinued.  He was also started on amiodarone 200 mg bid x 2 weeks with plan to decrease to 200 mg daily afterwards.  He was arranged for TEE/DCCV with Dr. Haroldine Laws scheduled for 12/27.  He had blood work drawn but unfortunately was not processed at that time.   He was able to get his blood work drawn yesterday and was called with emergent results of potassium >7.5 today.  He was advised to present to the ED immediately.   Patient states that he has otherwise been feeling well.  He has not had any chest pain, palpitations, dyspnea, nausea, vomit, abdominal pain, dysuria, or peripheral edema.   He does not take any potassium supplements.  His home meds include amlodipine-benazepril 10-40 mg, eplerenone 25 mg, Farxiga 10 mg, metformin 500 mg, Lasix 40 mg.   [facility] ED Course  Labs/Imaging on admission: I have personally reviewed following labs and imaging studies.   Initial vitals showed BP 94/60, pulse 80, RR 20, temp 97.7 F, SpO2 100% on 2 L O2 via [place].   Labs showed potassium 6.6, BUN 72, creatinine 2.88, sodium 135, bicarb 19, serum glucose 163, WBC 6.7, hemoglobin 13.0, platelets 214,000.   Patient was given 500 cc  normal saline, IV Lasix 40 mg, 10 g Lokelma, 5 units NovoLog with amp D50.  The hospitalist service was consulted to admit for further evaluation and management".  10/18/2022: Patient is a 76 year old Caucasian male with systolic congestive heart failure and chronic kidney disease stage IIIa/b.  Patient was recently started on Farxiga about 3 weeks ago.  Patient was also on benazepril and eplerenone.  Patient was advised to come to the hospital due to elevated potassium (7.5).  Worsening renal function is also noted.  Patient has been on treatment for hyperkalemia.  Potassium has improved to 5.5.  Serum creatinine is a bit worsened.  Renal ultrasound has not revealed any obstructive uropathy/hydronephrosis.  Patient looks a bit volume depleted.  Patient was seen alongside patient's son and daughter.   Assessment & Plan:   Principal Problem:   Hyperkalemia Active Problems:   CAD (coronary artery disease)   Hypertension associated with diabetes ([place])   Hyperlipidemia associated with type 2 diabetes mellitus (HCC)   Insulin dependent type 2 diabetes mellitus ([place])   Acute renal failure superimposed on stage 3a chronic kidney disease (HCC)   Chronic combined systolic and diastolic CHF (congestive heart failure) (HCC)   Atrial flutter (HCC)   COPD (chronic obstructive pulmonary disease) (HCC)   Hyperkalemia Potassium >7.5 on outpatient labs 12/21.  Repeat is 6.6 today in the ED.  Received temporizing measures with Lasix, NovoLog, Lokelma, calcium gluconate. -Stat BMET now -Hold benazepril and eplerenone -Keep on telemetry 10/18/2022:  Potassium is gradually improving.  Potassium is down to 5.5.  Continue Lokelma.  Renal diet.  Renal ultrasound was negative for obstructive uropathy.   Acute renal failure superimposed on stage 3a chronic kidney disease ([place]) Has been having progressively worsening renal function over this past year.  Baseline creatinine was 1-1.3 earlier in the year.  Creatinine  is 2.88 on admit.  Appears euvolemic. -Hold benazepril, eplerenone, Lasix, metformin, Farxiga 10/18/2022: AKI is likely multifactorial.  See above documentation.  Patient also looks volume depleted.   Chronic combined systolic and diastolic CHF (congestive heart failure) (HCC) Appears euvolemic.  Last EF 30-25 %. -Holding benazepril, eplerenone in setting of hyperkalemia -Holding Farxiga, Lasix with AKI -Not on beta-blocker, presumably due to history of bradycardia 10/18/2022: CHF is compensated.  Patient is known to the heart failure team.   Atrial flutter ([place]) Recently diagnosed and started on amiodarone and Eliquis.  Plan for TEE/DCCV on 12/27 with Dr. Haroldine Laws. -Continue Eliquis -Hold amiodarone for now 10/18/2022: Heart rate is controlled.  Patient mains in A-fib/a flutter.  Cardioversion is planned in 4 days' time.   Insulin dependent type 2 diabetes mellitus (HCC) Hold metformin.  Placed on SSI.   COPD (chronic obstructive pulmonary disease) (HCC) Chronic respiratory failure with hypoxia Stable on home 2 L O2, no wheezing on admission.  Continue albuterol as needed.   Hyperlipidemia associated with type 2 diabetes mellitus (HCC) Continue rosuvastatin.   Hypertension associated with diabetes ([place]) Hold benazepril and eplerenone in setting of hyperkalemia.  Continue amlodipine.   CAD (coronary artery disease) S/p CABG.  Denies any chest pain.  Continue rosuvastatin and Eliquis.     DVT prophylaxis: Eliquis Code Status: Full code Family Communication: Son and daughter Disposition Plan: Admit patient to inpatient status.   Consultants:  None  Procedures:  None  Antimicrobials:  None   Subjective: No new complaints.  Objective: Vitals:   10/18/22 0442 10/18/22 0752 10/18/22 1233 10/18/22 1605  BP: 110/60 117/72 (!) 86/55 128/66  Pulse: (!) 54 81 79 84  Resp:  '16 15 17  '$ Temp: 97.7 F (36.5 C) 97.6 F (36.4 C) 97.6 F (36.4 C) 97.9 F (36.6 C)   TempSrc: Oral Oral Oral Oral  SpO2: 94% 99% 99% 97%  Weight: 76.2 kg     Height:        Intake/Output Summary (Last 24 hours) at 10/18/2022 1733 Last data filed at 10/18/2022 1300 Gross per 24 hour  Intake 1197.52 ml  Output 1405 ml  Net -207.48 ml   Filed Weights   10/17/22 1506 10/17/22 2018 10/18/22 0442  Weight: 76.2 kg 76.2 kg 76.2 kg    Examination:  General exam: Appears calm and comfortable  Respiratory system: Clear to auscultation. Respiratory effort normal. Cardiovascular system: S1 & S2 heard Gastrointestinal system: Abdomen is soft and nontender.  Central nervous system: Alert and oriented. No focal neurological deficits. Extremities: No leg edema.  Data Reviewed: I have personally reviewed following labs and imaging studies  CBC: Recent Labs  Lab 10/16/22 1409 10/17/22 1510 10/18/22 0130  WBC 7.0 6.7 7.3  NEUTROABS 5.1 4.5  --   HGB 12.3* 13.0 11.8*  HCT 40.1 42.9 37.3*  MCV 90.5 90.3 88.6  PLT 221 214 174   Basic Metabolic Panel: Recent Labs  Lab 10/16/22 1409 10/17/22 1510 10/17/22 2134 10/18/22 0128 10/18/22 0130 10/18/22 1607  NA 131* 135 136  --  135 142  K >7.5* 6.6* 6.3*  --  5.2* 5.5*  CL 106 106  109  --  109 111  CO2 18* 19* 18*  --  19* 24  GLUCOSE 235* 163* 94  --  257* 141*  BUN 81* 72* 70*  --  68* 56*  CREATININE 3.26* 2.88* 2.84*  --  2.79* 2.90*  CALCIUM 9.0 9.7 9.7  --  9.0 9.7  MG  --   --   --  1.9  --   --   PHOS  --   --   --   --   --  4.3   GFR: Estimated Creatinine Clearance: 23.4 mL/min (A) (by C-G formula based on SCr of 2.9 mg/dL (H)). Liver Function Tests: Recent Labs  Lab 10/18/22 1607  ALBUMIN 3.8   No results for input(s): "LIPASE", "AMYLASE" in the last 168 hours. No results for input(s): "AMMONIA" in the last 168 hours. Coagulation Profile: No results for input(s): "INR", "PROTIME" in the last 168 hours. Cardiac Enzymes: No results for input(s): "CKTOTAL", "CKMB", "CKMBINDEX", "TROPONINI" in  the last 168 hours. BNP (last 3 results) No results for input(s): "PROBNP" in the last 8760 hours. HbA1C: No results for input(s): "HGBA1C" in the last 72 hours. CBG: Recent Labs  Lab 10/17/22 2147 10/18/22 0101 10/18/22 0757 10/18/22 1232 10/18/22 1612  GLUCAP 93 284* 105* 267* 138*   Lipid Profile: No results for input(s): "CHOL", "HDL", "LDLCALC", "TRIG", "CHOLHDL", "LDLDIRECT" in the last 72 hours. Thyroid Function Tests: No results for input(s): "TSH", "T4TOTAL", "FREET4", "T3FREE", "THYROIDAB" in the last 72 hours. Anemia Panel: No results for input(s): "VITAMINB12", "FOLATE", "FERRITIN", "TIBC", "IRON", "RETICCTPCT" in the last 72 hours. Urine analysis:    Component Value Date/Time   COLORURINE YELLOW 07/19/2013 Short 07/19/2013 1223   LABSPEC 1.013 07/19/2013 1223   PHURINE 5.5 07/19/2013 1223   GLUCOSEU 500 (A) 07/19/2013 1223   HGBUR NEGATIVE 07/19/2013 1223   BILIRUBINUR NEGATIVE 07/19/2013 1223   KETONESUR NEGATIVE 07/19/2013 1223   PROTEINUR NEGATIVE 07/19/2013 1223   UROBILINOGEN 1.0 07/19/2013 1223   NITRITE NEGATIVE 07/19/2013 1223   LEUKOCYTESUR NEGATIVE 07/19/2013 1223   Sepsis Labs: '@LABRCNTIP'$ (procalcitonin:4,lacticidven:4)  )No results found for this or any previous visit (from the past 240 hour(s)).       Radiology Studies: US RENAL  Result Date: 10/18/2022 CLINICAL DATA:  [id number] AKI (acute kidney injury) [facility]) [id number] EXAM: RENAL / URINARY TRACT ULTRASOUND COMPLETE COMPARISON:  09/23/2022 FINDINGS: The right kidney measured 9.7 cm and the left kidney measured 9.7 cm. The kidneys demonstrate normal echogenicity. Lower pole cyst on the right measures 4.9 x 3.4 x 2.7 cm. Upper pole cyst on the right measures 2.8 x 2.5 x 2.3 cm. Upper pole cyst on the left measures 1.9 x 1.8 x 1.5 cm. No further imaging follow-up recommended for the cysts. No shadowing stones are seen. No hydronephrosis. The urinary bladder appeared unremarkable.  Bladder volumes not measured. IMPRESSION: Renal cysts.  Otherwise unremarkable study. Electronically Signed   By: Sammie Bench M.D.   On: 10/18/2022 14:43        Scheduled Meds:  apixaban  5 mg Oral BID   insulin aspart  0-9 Units Subcutaneous TID WC   rosuvastatin  20 mg Oral Daily   Continuous Infusions:   LOS: 0 days    Time spent: 55 minutes    Dana Allan, MD  [facility] Pager #: [phone] 7PM-7AM contact night coverage as above

## 2022-10-18 NOTE — Progress Notes (Signed)
Patient and family stated stat renal function panel had not been drawn.  Spoke to [place] in Phlebotomy who stated that there was a previous sample that was approved for use.  I updated Dr Marthenia Rolling via secure chat, he stated to get a fresh draw renal function panel STAT, I re entered the order and also updated Apolonio Schneiders (phlebotomy) via phone.

## 2022-10-19 DIAGNOSIS — N189 Chronic kidney disease, unspecified: Secondary | ICD-10-CM

## 2022-10-19 DIAGNOSIS — Z794 Long term (current) use of insulin: Secondary | ICD-10-CM | POA: Diagnosis not present

## 2022-10-19 DIAGNOSIS — J9611 Chronic respiratory failure with hypoxia: Secondary | ICD-10-CM | POA: Diagnosis present

## 2022-10-19 DIAGNOSIS — J449 Chronic obstructive pulmonary disease, unspecified: Secondary | ICD-10-CM | POA: Diagnosis present

## 2022-10-19 DIAGNOSIS — N179 Acute kidney failure, unspecified: Secondary | ICD-10-CM

## 2022-10-19 DIAGNOSIS — I4892 Unspecified atrial flutter: Secondary | ICD-10-CM | POA: Diagnosis present

## 2022-10-19 DIAGNOSIS — Z87891 Personal history of nicotine dependence: Secondary | ICD-10-CM | POA: Diagnosis not present

## 2022-10-19 DIAGNOSIS — E875 Hyperkalemia: Secondary | ICD-10-CM | POA: Diagnosis not present

## 2022-10-19 DIAGNOSIS — N1831 Chronic kidney disease, stage 3a: Secondary | ICD-10-CM | POA: Diagnosis present

## 2022-10-19 DIAGNOSIS — E1151 Type 2 diabetes mellitus with diabetic peripheral angiopathy without gangrene: Secondary | ICD-10-CM | POA: Diagnosis present

## 2022-10-19 DIAGNOSIS — I5042 Chronic combined systolic (congestive) and diastolic (congestive) heart failure: Secondary | ICD-10-CM | POA: Diagnosis present

## 2022-10-19 DIAGNOSIS — E1169 Type 2 diabetes mellitus with other specified complication: Secondary | ICD-10-CM | POA: Diagnosis present

## 2022-10-19 DIAGNOSIS — E1122 Type 2 diabetes mellitus with diabetic chronic kidney disease: Secondary | ICD-10-CM | POA: Diagnosis present

## 2022-10-19 DIAGNOSIS — Z66 Do not resuscitate: Secondary | ICD-10-CM | POA: Diagnosis present

## 2022-10-19 DIAGNOSIS — E869 Volume depletion, unspecified: Secondary | ICD-10-CM | POA: Diagnosis present

## 2022-10-19 DIAGNOSIS — E785 Hyperlipidemia, unspecified: Secondary | ICD-10-CM | POA: Diagnosis present

## 2022-10-19 DIAGNOSIS — I4891 Unspecified atrial fibrillation: Secondary | ICD-10-CM | POA: Diagnosis present

## 2022-10-19 DIAGNOSIS — Z79899 Other long term (current) drug therapy: Secondary | ICD-10-CM | POA: Diagnosis not present

## 2022-10-19 DIAGNOSIS — I130 Hypertensive heart and chronic kidney disease with heart failure and stage 1 through stage 4 chronic kidney disease, or unspecified chronic kidney disease: Secondary | ICD-10-CM | POA: Diagnosis present

## 2022-10-19 HISTORY — DX: Chronic kidney disease, unspecified: N18.9

## 2022-10-19 HISTORY — DX: Chronic kidney disease, unspecified: N17.9

## 2022-10-19 LAB — URINALYSIS, ROUTINE W REFLEX MICROSCOPIC
Bacteria, UA: NONE SEEN
Bilirubin Urine: NEGATIVE
Glucose, UA: >=500 mg/dL — AB
Hgb urine dipstick: NEGATIVE
Ketones, ur: NEGATIVE mg/dL
Leukocytes,Ua: NEGATIVE
Nitrite: NEGATIVE
Protein, ur: NEGATIVE mg/dL
Specific Gravity, Urine: 1.009 (ref 1.005–1.030)
pH: 5 (ref 5.0–8.0)

## 2022-10-19 LAB — RENAL FUNCTION PANEL
Albumin: 3.3 g/dL — ABNORMAL LOW (ref 3.5–5.0)
Anion gap: 11 (ref 5–15)
BUN: 45 mg/dL — ABNORMAL HIGH (ref 8–23)
CO2: 19 mmol/L — ABNORMAL LOW (ref 22–32)
Calcium: 9.5 mg/dL (ref 8.9–10.3)
Chloride: 109 mmol/L (ref 98–111)
Creatinine, Ser: 2.34 mg/dL — ABNORMAL HIGH (ref 0.61–1.24)
GFR, Estimated: 28 mL/min — ABNORMAL LOW (ref >60)
Glucose, Bld: 125 mg/dL — ABNORMAL HIGH (ref 70–99)
Phosphorus: 4.1 mg/dL (ref 2.5–4.6)
Potassium: 5.4 mmol/L — ABNORMAL HIGH (ref 3.5–5.1)
Sodium: 139 mmol/L (ref 135–145)

## 2022-10-19 LAB — GLUCOSE, CAPILLARY: Glucose-Capillary: 119 mg/dL — ABNORMAL HIGH (ref 70–99)

## 2022-10-19 MED ORDER — SODIUM ZIRCONIUM CYCLOSILICATE 10 G PO PACK: 10.0000 g | PACK | Freq: Every day | ORAL | 0 refills | Status: AC

## 2022-10-19 MED ORDER — AMIODARONE HCL 200 MG PO TABS: 200.0000 mg | ORAL_TABLET | Freq: Every day | ORAL | 0 refills | Status: DC

## 2022-10-19 NOTE — Discharge Summary (Signed)
Physician Discharge Summary  Patient ID: JOVANI FLURY MRN: [id number] DOB/AGE: [birth year] 76 y.o.  Admit date: 10/17/2022 Discharge date: 10/19/2022  Admission Diagnoses:  Discharge Diagnoses:  Principal Problem:   Hyperkalemia Active Problems:   CAD (coronary artery disease)   Hypertension associated with diabetes ([place])   Hyperlipidemia associated with type 2 diabetes mellitus (HCC)   Insulin dependent type 2 diabetes mellitus ([place])   Acute renal failure superimposed on stage 3a chronic kidney disease (HCC)   Chronic combined systolic and diastolic CHF (congestive heart failure) (HCC)   Atrial flutter (HCC)   COPD (chronic obstructive pulmonary disease) (HCC)   Acute kidney injury superimposed on chronic kidney disease ([place])   Discharged Condition: stable  Hospital Course: Patient is a 76 year old male past medical history significant for chronic combined systolic and diastolic CHF, coronary artery disease status post CABG, recently diagnosed atrial flutter on Eliquis, COPD, chronic respiratory failure with hypoxia on 2 L of supplemental oxygen via nasal cannula, peripheral artery disease, chronic kidney disease stage III, type 2 diabetes mellitus, hypertension and hyperlipidemia.  Patient was admitted with hyperkalemia.  Apparently, patient had a blood work done and was called with the information the potassium was greater than 7.5.  Patient was advised to come to the hospital/emergency room for further assessment and management.  Patient was on benazepril prior to presentation, as well as, eplerenone, Farxiga and metformin.  Worsening renal function was also noted on presentation.  Patient was admitted for further assessment and management.  Renal ultrasound was negative for obstructive uropathy.  Patient send volume depleted.  Patient was on Lasix prior to admission.  Hyperkalemia was managed with Lokelma, insulin and calcium gluconate.  Benazepril and epineurogram were held.   Potassium gradually improved.  Potassium prior to discharge was 5.4.  Patient will be discharged on Lokelma 10 g p.o. once daily for the next 3 days.  Patient will need BMP done in 2 days time.  Patient will follow-up with primary care provider, nephrology team and cardiology.  Hyperkalemia Potassium >7.5 on outpatient labs 10/16/2022.  Repeat potassium was 6.6 at the ED. patient received temporizing measures with Lasix, NovoLog, Lokelma, calcium gluconate. -Benazepril and eplerenone were held. -Patient was admitted for further assessment and management. -Renal panel was monitored throughout the hospital stay. -BMP prior to discharge revealed potassium of 5.4.  Patient be discharged on [place].  Please see above documentation.   Acute renal failure superimposed on stage 3a chronic kidney disease ([place]) -Patient has been having progressively worsening renal function over the past year. -Baseline creatinine was 1-1.3 earlier in the year. -Creatinine was 2.88 on admit.   -Held benazepril, eplerenone, Lasix, metformin, Farxiga -AKI is likely multifactorial.     Chronic combined systolic and diastolic CHF (congestive heart failure) (HCC) -Last EF 30-25 %. -Holding benazepril, eplerenone in setting of hyperkalemia -Holding Farxiga, Lasix with AKI -Not on beta-blocker, presumably due to history of bradycardia 10/18/2022: CHF compensated.  Patient is known to the heart failure team.   Atrial flutter ([place]) Recently diagnosed and started on amiodarone and Eliquis.  Plan for TEE/DCCV on 12/27 with Dr. Haroldine Laws. -Continue Eliquis -Hold amiodarone for now 10/18/2022: Heart rate is controlled.  Patient mains in A-fib/a flutter.  Cardioversion is planned in 4 days' time.   Insulin dependent type 2 diabetes mellitus (HCC) Hold metformin.  Placed on SSI.   COPD (chronic obstructive pulmonary disease) (HCC) Chronic respiratory failure with hypoxia Stable on home 2 L O2, no wheezing on admission.   Continue  albuterol as needed.   Hyperlipidemia associated with type 2 diabetes mellitus (HCC) Continue rosuvastatin.   Hypertension associated with diabetes ([place]) Hold benazepril and eplerenone in setting of hyperkalemia.  Continue amlodipine.   CAD (coronary artery disease) S/p CABG.  Denies any chest pain.  Continue rosuvastatin and Eliquis.      Consults: None  Significant Diagnostic Studies:  Potassium of 7.5 from presentation.  Prior to discharge, potassium was 5.4.  .   Discharge Exam: Blood pressure (!) 115/48, pulse 81, temperature 97.9 F (36.6 C), temperature source Oral, resp. rate 18, height '5\' 11"'$  (1.803 m), weight 76.2 kg, SpO2 100 %.   Disposition: Discharge disposition: 01-Home or Self Care       Discharge Instructions     Diet - low sodium heart healthy   Complete by: As directed    Increase activity slowly   Complete by: As directed       Allergies as of 10/19/2022       Reactions   Aldactone [spironolactone] Other (See Comments)   Swollen breast   Lipitor [atorvastatin Calcium] Other (See Comments)   Muscle ache   Sulfa Drugs Cross Reactors Other (See Comments)   "raw spots"   Sulfamethoxazole-trimethoprim    Other reaction(s): Rash   Codeine Rash        Medication List     STOP taking these medications    acetaminophen 325 MG tablet Commonly known as: TYLENOL   amLODipine-benazepril 10-40 MG capsule Commonly known as: LOTREL   dapagliflozin propanediol 10 MG Tabs tablet Commonly known as: FARXIGA   eplerenone 50 MG tablet Commonly known as: INSPRA   furosemide 40 MG tablet Commonly known as: LASIX   metFORMIN 500 MG 24 hr tablet Commonly known as: GLUCOPHAGE-XR   pantoprazole 20 MG tablet Commonly known as: PROTONIX       TAKE these medications    albuterol 108 (90 Base) MCG/ACT inhaler Commonly known as: VENTOLIN HFA Inhale 2 puffs into the lungs every 6 (six) hours as needed for wheezing or shortness of  breath.   amiodarone 200 MG tablet Commonly known as: PACERONE Take 1 tablet (200 mg total) by mouth daily.   apixaban 5 MG Tabs tablet Commonly known as: ELIQUIS Take 1 tablet (5 mg total) by mouth 2 (two) times daily.   insulin NPH Human 100 UNIT/ML injection Commonly known as: NOVOLIN N Inject 35-40 Units into the skin See admin instructions. Inject 40 units into the skin in the morning and 35 units at night   insulin regular 100 units/mL injection Commonly known as: NOVOLIN R Inject 6-15 Units into the skin 3 (three) times daily before meals. Per sliding scale   nitroGLYCERIN 0.4 MG SL tablet Commonly known as: NITROSTAT Place 1 tablet (0.4 mg total) under the tongue every 5 (five) minutes as needed for chest pain.   rosuvastatin 20 MG tablet Commonly known as: CRESTOR Take 1 tablet by mouth once daily   sodium zirconium cyclosilicate 10 g Pack packet Commonly known as: LOKELMA Take 10 g by mouth daily for 3 days.   Vitamin D-3 125 MCG (5000 UT) Tabs Take 5,000 Units by mouth daily.         SignedBonnell Public 10/19/2022, 11:39 AM

## 2022-10-19 NOTE — Progress Notes (Signed)
Order received to schedule appointment: follow up with PCP, Cardiology (CHF team) and Nephrology within 1 week of discharge.  Check Renal panel in 2 days (Tuesday).  Patient's daughter [name] states the patient doesn't have a nephrologist but will get one through the PCP/Cardiologist, she is aware that lab work is needed on Tuesday.

## 2022-10-21 ENCOUNTER — Ambulatory Visit (HOSPITAL_COMMUNITY)
Admission: RE | Admit: 2022-10-21 | Discharge: 2022-10-21 | Disposition: A | Discharge status: Home / Self Care | Payer: Medicare HMO | Source: Ambulatory Visit | Attending: Internal Medicine | Admitting: Internal Medicine

## 2022-10-21 ENCOUNTER — Other Ambulatory Visit (HOSPITAL_COMMUNITY): Payer: Self-pay | Admitting: *Deleted

## 2022-10-21 DIAGNOSIS — I5032 Chronic diastolic (congestive) heart failure: Secondary | ICD-10-CM | POA: Diagnosis not present

## 2022-10-21 LAB — BASIC METABOLIC PANEL
Anion gap: 10 (ref 5–15)
BUN: 32 mg/dL — ABNORMAL HIGH (ref 8–23)
CO2: 19 mmol/L — ABNORMAL LOW (ref 22–32)
Calcium: 9.4 mg/dL (ref 8.9–10.3)
Chloride: 111 mmol/L (ref 98–111)
Creatinine, Ser: 1.67 mg/dL — ABNORMAL HIGH (ref 0.61–1.24)
GFR, Estimated: 42 mL/min — ABNORMAL LOW (ref >60)
Glucose, Bld: 164 mg/dL — ABNORMAL HIGH (ref 70–99)
Potassium: 5 mmol/L (ref 3.5–5.1)
Sodium: 140 mmol/L (ref 135–145)

## 2022-10-21 NOTE — Anesthesia Preprocedure Evaluation (Signed)
Anesthesia Evaluation  Patient identified by MRN, date of birth, ID band Patient awake    Reviewed: Allergy & Precautions, NPO status , Patient's Chart, lab work & pertinent test results  History of Anesthesia Complications Negative for: history of anesthetic complications  Airway Mallampati: II  TM Distance: >3 FB Neck ROM: Full    Dental  (+) Edentulous Lower, Edentulous Upper   Pulmonary COPD,  oxygen dependent, former smoker   Pulmonary exam normal        Cardiovascular hypertension, + CAD, + CABG, + Peripheral Vascular Disease and +CHF  Normal cardiovascular exam+ dysrhythmias Atrial Fibrillation   Echo 09/21/22: EF 30-35%, mild LVH, g2dd, mildly reduced RVSF, RVSP 42.3, mild MR, mild/mod TR, mild AR  R heart cath 09/23/22: mild PAH with normal left-sided filling pressures and normal output   Neuro/Psych Carotid artery disease    GI/Hepatic Neg liver ROS,GERD  ,,  Endo/Other  diabetes, Insulin Dependent    Renal/GU CRFRenal disease     Musculoskeletal  (+) Arthritis ,    Abdominal   Peds  Hematology negative hematology ROS (+)   Anesthesia Other Findings   Reproductive/Obstetrics                             Anesthesia Physical Anesthesia Plan  ASA: 3  Anesthesia Plan: MAC   Post-op Pain Management: Minimal or no pain anticipated   Induction: Intravenous  PONV Risk Score and Plan: 1 and Propofol infusion, TIVA and Treatment may vary due to age or medical condition  Airway Management Planned: Natural Airway, Nasal Cannula and Simple Face Mask  Additional Equipment: None  Intra-op Plan:   Post-operative Plan:   Informed Consent: I have reviewed the patients History and Physical, chart, labs and discussed the procedure including the risks, benefits and alternatives for the proposed anesthesia with the patient or authorized representative who has indicated his/her  understanding and acceptance.       Plan Discussed with:   Anesthesia Plan Comments:        Anesthesia Quick Evaluation

## 2022-10-22 ENCOUNTER — Encounter (HOSPITAL_COMMUNITY): Payer: Self-pay | Admitting: Internal Medicine

## 2022-10-22 ENCOUNTER — Ambulatory Visit (HOSPITAL_COMMUNITY): Payer: Medicare HMO | Admitting: Certified Registered"

## 2022-10-22 ENCOUNTER — Ambulatory Visit (HOSPITAL_COMMUNITY)
Admission: RE | Admit: 2022-10-22 | Discharge: 2022-10-22 | Disposition: A | Discharge status: Home / Self Care | Payer: Medicare HMO | Attending: Internal Medicine | Admitting: Internal Medicine

## 2022-10-22 ENCOUNTER — Ambulatory Visit (HOSPITAL_BASED_OUTPATIENT_CLINIC_OR_DEPARTMENT_OTHER)
Admission: RE | Admit: 2022-10-22 | Discharge: 2022-10-22 | Disposition: A | Discharge status: Home / Self Care | Payer: Medicare HMO | Source: Home / Self Care | Attending: Internal Medicine | Admitting: Internal Medicine

## 2022-10-22 ENCOUNTER — Ambulatory Visit (HOSPITAL_BASED_OUTPATIENT_CLINIC_OR_DEPARTMENT_OTHER): Payer: Medicare HMO | Admitting: Certified Registered"

## 2022-10-22 ENCOUNTER — Encounter (HOSPITAL_COMMUNITY)
Admission: RE | Disposition: A | Discharge status: Home / Self Care | Payer: Self-pay | Source: Home / Self Care | Attending: Internal Medicine

## 2022-10-22 ENCOUNTER — Other Ambulatory Visit: Payer: Self-pay

## 2022-10-22 DIAGNOSIS — I2510 Atherosclerotic heart disease of native coronary artery without angina pectoris: Secondary | ICD-10-CM | POA: Diagnosis not present

## 2022-10-22 DIAGNOSIS — I340 Nonrheumatic mitral (valve) insufficiency: Secondary | ICD-10-CM | POA: Diagnosis not present

## 2022-10-22 DIAGNOSIS — I130 Hypertensive heart and chronic kidney disease with heart failure and stage 1 through stage 4 chronic kidney disease, or unspecified chronic kidney disease: Secondary | ICD-10-CM | POA: Insufficient documentation

## 2022-10-22 DIAGNOSIS — Z8546 Personal history of malignant neoplasm of prostate: Secondary | ICD-10-CM | POA: Diagnosis not present

## 2022-10-22 DIAGNOSIS — E785 Hyperlipidemia, unspecified: Secondary | ICD-10-CM | POA: Insufficient documentation

## 2022-10-22 DIAGNOSIS — I4892 Unspecified atrial flutter: Secondary | ICD-10-CM

## 2022-10-22 DIAGNOSIS — I080 Rheumatic disorders of both mitral and aortic valves: Secondary | ICD-10-CM | POA: Diagnosis not present

## 2022-10-22 DIAGNOSIS — I6523 Occlusion and stenosis of bilateral carotid arteries: Secondary | ICD-10-CM | POA: Diagnosis not present

## 2022-10-22 DIAGNOSIS — Z9981 Dependence on supplemental oxygen: Secondary | ICD-10-CM | POA: Insufficient documentation

## 2022-10-22 DIAGNOSIS — Z794 Long term (current) use of insulin: Secondary | ICD-10-CM

## 2022-10-22 DIAGNOSIS — E1151 Type 2 diabetes mellitus with diabetic peripheral angiopathy without gangrene: Secondary | ICD-10-CM | POA: Insufficient documentation

## 2022-10-22 DIAGNOSIS — Z951 Presence of aortocoronary bypass graft: Secondary | ICD-10-CM | POA: Insufficient documentation

## 2022-10-22 DIAGNOSIS — Z79899 Other long term (current) drug therapy: Secondary | ICD-10-CM | POA: Insufficient documentation

## 2022-10-22 DIAGNOSIS — N189 Chronic kidney disease, unspecified: Secondary | ICD-10-CM

## 2022-10-22 DIAGNOSIS — I509 Heart failure, unspecified: Secondary | ICD-10-CM | POA: Diagnosis not present

## 2022-10-22 DIAGNOSIS — N1831 Chronic kidney disease, stage 3a: Secondary | ICD-10-CM | POA: Diagnosis not present

## 2022-10-22 DIAGNOSIS — D509 Iron deficiency anemia, unspecified: Secondary | ICD-10-CM | POA: Diagnosis not present

## 2022-10-22 DIAGNOSIS — I700 Atherosclerosis of aorta: Secondary | ICD-10-CM | POA: Insufficient documentation

## 2022-10-22 DIAGNOSIS — E1122 Type 2 diabetes mellitus with diabetic chronic kidney disease: Secondary | ICD-10-CM | POA: Insufficient documentation

## 2022-10-22 DIAGNOSIS — I5042 Chronic combined systolic (congestive) and diastolic (congestive) heart failure: Secondary | ICD-10-CM | POA: Insufficient documentation

## 2022-10-22 DIAGNOSIS — J449 Chronic obstructive pulmonary disease, unspecified: Secondary | ICD-10-CM | POA: Insufficient documentation

## 2022-10-22 DIAGNOSIS — Z7984 Long term (current) use of oral hypoglycemic drugs: Secondary | ICD-10-CM | POA: Diagnosis not present

## 2022-10-22 DIAGNOSIS — Z87891 Personal history of nicotine dependence: Secondary | ICD-10-CM | POA: Diagnosis not present

## 2022-10-22 HISTORY — PX: CARDIOVERSION: SHX1299

## 2022-10-22 HISTORY — PX: TEE WITHOUT CARDIOVERSION: SHX5443

## 2022-10-22 LAB — POCT I-STAT, CHEM 8
BUN: 44 mg/dL — ABNORMAL HIGH (ref 8–23)
Calcium, Ion: 1.16 mmol/L (ref 1.15–1.40)
Chloride: 111 mmol/L (ref 98–111)
Creatinine, Ser: 1.5 mg/dL — ABNORMAL HIGH (ref 0.61–1.24)
Glucose, Bld: 63 mg/dL — ABNORMAL LOW (ref 70–99)
HCT: 41 % (ref 39.0–52.0)
Hemoglobin: 13.9 g/dL (ref 13.0–17.0)
Potassium: 4.7 mmol/L (ref 3.5–5.1)
Sodium: 141 mmol/L (ref 135–145)
TCO2: 22 mmol/L (ref 22–32)

## 2022-10-22 LAB — GLUCOSE, CAPILLARY
Glucose-Capillary: 128 mg/dL — ABNORMAL HIGH (ref 70–99)
Glucose-Capillary: 75 mg/dL (ref 70–99)

## 2022-10-22 SURGERY — ECHOCARDIOGRAM, TRANSESOPHAGEAL
Anesthesia: Monitor Anesthesia Care

## 2022-10-22 MED ORDER — PROPOFOL 10 MG/ML IV BOLUS
INTRAVENOUS | Status: DC | PRN
  Administered 2022-10-22: 20 mg via INTRAVENOUS

## 2022-10-22 MED ORDER — LIDOCAINE 2% (20 MG/ML) 5 ML SYRINGE
INTRAMUSCULAR | Status: DC | PRN
  Administered 2022-10-22: 100 mg via INTRAVENOUS

## 2022-10-22 MED ORDER — EPHEDRINE SULFATE (PRESSORS) 50 MG/ML IJ SOLN
INTRAMUSCULAR | Status: DC | PRN
  Administered 2022-10-22: 5 mg via INTRAVENOUS

## 2022-10-22 MED ORDER — SODIUM CHLORIDE 0.9 % IV SOLN: INTRAVENOUS | Status: DC

## 2022-10-22 MED ORDER — PHENYLEPHRINE 80 MCG/ML (10ML) SYRINGE FOR IV PUSH (FOR BLOOD PRESSURE SUPPORT)
PREFILLED_SYRINGE | INTRAVENOUS | Status: DC | PRN
  Administered 2022-10-22 (×4): 80 ug via INTRAVENOUS

## 2022-10-22 MED ORDER — PROPOFOL 500 MG/50ML IV EMUL
INTRAVENOUS | Status: DC | PRN
  Administered 2022-10-22: 100 ug/kg/min via INTRAVENOUS

## 2022-10-22 MED ORDER — DEXTROSE 50 % IV SOLN
INTRAVENOUS | Status: AC
  Filled 2022-10-22: qty 50

## 2022-10-22 MED ORDER — DEXTROSE 50 % IV SOLN
12.5000 g | INTRAVENOUS | Status: AC
  Administered 2022-10-22: 12.5 g via INTRAVENOUS

## 2022-10-22 MED ORDER — LACTATED RINGERS IV SOLN: INTRAVENOUS | Status: DC

## 2022-10-22 MED ORDER — GLYCOPYRROLATE 0.2 MG/ML IJ SOLN
INTRAMUSCULAR | Status: DC | PRN
  Administered 2022-10-22: .2 mg via INTRAVENOUS

## 2022-10-22 NOTE — Transfer of Care (Signed)
Immediate Anesthesia Transfer of Care Note  Patient: [name]  Procedure(s) Performed: TRANSESOPHAGEAL ECHOCARDIOGRAM (TEE) CARDIOVERSION  Patient Location: PACU  Anesthesia Type:MAC  Level of Consciousness: drowsy and patient cooperative  Airway & Oxygen Therapy: Patient Spontanous Breathing and Patient connected to nasal cannula oxygen  Post-op Assessment: Report given to RN, Post -op Vital signs reviewed and stable, and Patient moving all extremities  Post vital signs: Reviewed and stable  Last Vitals:  Vitals Value Taken Time  BP 121/51 10/22/22 0820  Temp    Pulse 55 10/22/22 0820  Resp 18 10/22/22 0820  SpO2 96 % 10/22/22 0820  Vitals shown include unvalidated device data.  Last Pain:  Vitals:   10/22/22 0703  TempSrc: Temporal  PainSc: 0-No pain         Complications: No notable events documented.

## 2022-10-22 NOTE — CV Procedure (Signed)
   TRANSESOPHAGEAL ECHOCARDIOGRAM GUIDED DIRECT CURRENT CARDIOVERSION  NAME:  [name]   MRN: [id number] DOB:  [birth year]   ADMIT DATE: 10/22/2022  INDICATIONS:  Atrial flutter  PROCEDURE:   Informed consent was obtained prior to the procedure. The risks, benefits and alternatives for the procedure were discussed and the patient comprehended these risks.  Risks include, but are not limited to, cough, sore throat, vomiting, nausea, somnolence, esophageal and stomach trauma or perforation, bleeding, low blood pressure, aspiration, pneumonia, infection, trauma to the teeth and death.    After a procedural time-out, the oropharynx was anesthetized and the patient was sedated by the anesthesia service. The transesophageal probe was inserted in the esophagus and stomach without difficulty and multiple views were obtained.   FINDINGS:  LEFT VENTRICLE: EF = 30-35%  RIGHT VENTRICLE: Normal  LEFT ATRIUM: Moderately dilated  LEFT ATRIAL APPENDAGE: No clot  RIGHT ATRIUM: Moderately dilated  AORTIC VALVE:  [place].  Mild calcification. Mild central AI   MITRAL VALVE:    Normal Mild MR  TRICUSPID VALVE: Normal trivial TR   PULMONIC VALVE: Trivial PI  INTERATRIAL SEPTUM: No PFO/ASD  PERICARDIUM: No effusion  DESCENDING AORTA: Mild plaque   CARDIOVERSION:     Indications:  Atrial Flutter  Procedure Details:  Once the TEE was complete, the patient had the defibrillator pads placed in the anterior and posterior position. Once an appropriate level of sedation was achieved, the patient received a single biphasic, synchronized 150J shock with prompt conversion to sinus bradycardia. No apparent complications.   Glori Bickers, MD  8:13 AM

## 2022-10-22 NOTE — Anesthesia Procedure Notes (Signed)
Procedure Name: MAC Date/Time: 10/22/2022 7:45 AM  Performed by: Amadeo Garnet, CRNAPre-anesthesia Checklist: Patient identified, Emergency Drugs available, Suction available and Patient being monitored Patient Re-evaluated:Patient Re-evaluated prior to induction Oxygen Delivery Method: Nasal cannula Preoxygenation: Pre-oxygenation with 100% oxygen Induction Type: IV induction Placement Confirmation: positive ETCO2 Dental Injury: Teeth and Oropharynx as per pre-operative assessment

## 2022-10-22 NOTE — Interval H&P Note (Signed)
History and Physical Interval Note:  10/22/2022 7:43 AM  [name]  has presented today for surgery, with the diagnosis of AFLUTTER.  The various methods of treatment have been discussed with the patient and family. After consideration of risks, benefits and other options for treatment, the patient has consented to  Procedure(s): TRANSESOPHAGEAL ECHOCARDIOGRAM (TEE) (N/A) CARDIOVERSION (N/A) as a surgical intervention.  The patient's history has been reviewed, patient examined, no change in status, stable for surgery.  I have reviewed the patient's chart and labs.  Questions were answered to the patient's satisfaction.     [name]

## 2022-10-22 NOTE — Progress Notes (Signed)
  Echocardiogram Echocardiogram Transesophageal has been performed.  [name] M 10/22/2022, 8:29 AM

## 2022-10-22 NOTE — Anesthesia Postprocedure Evaluation (Signed)
Anesthesia Post Note  Patient: DECOREY WAHLERT  Procedure(s) Performed: TRANSESOPHAGEAL ECHOCARDIOGRAM (TEE) CARDIOVERSION     Patient location during evaluation: Endoscopy Anesthesia Type: MAC Level of consciousness: awake and alert Pain management: pain level controlled Vital Signs Assessment: post-procedure vital signs reviewed and stable Respiratory status: spontaneous breathing, nonlabored ventilation and respiratory function stable Cardiovascular status: blood pressure returned to baseline and stable Postop Assessment: no apparent nausea or vomiting Anesthetic complications: no   No notable events documented.  Last Vitals:  Vitals:   10/22/22 0830 10/22/22 0839  BP: (!) 114/58 (!) 122/59  Pulse: 60 61  Resp: 16 20  Temp:    SpO2: 94% 96%    Last Pain:  Vitals:   10/22/22 0839  TempSrc:   PainSc: 0-No pain                 Lidia Collum

## 2022-10-22 NOTE — Progress Notes (Unsigned)
Synopsis: Referred for copd by DrinkardCollie Siad, NP  Subjective:   PATIENT ID: [name] GENDER: male DOB: 06/09/1946, MRN: [id number]  No chief complaint on file.  2yM with history of CAD s/p CABG 2003, PAD s/p stents, R CEA, L CEA, GERD, prostate cancer, atrial flutter s/p TEE/DCCV 12/27 on amio, chronic combined systolic and diastolic CHF, OSA on BiPAP not using referred for COPD  Otherwise pertinent review of systems is negative.  Past Medical History:  Diagnosis Date   Arthritis    CAD (coronary artery disease)    Carotid artery occlusion    Diabetes mellitus    fasting 100-120s   Dizziness    Dysrhythmia    skips beats   GERD (gastroesophageal reflux disease)    History of kidney stones    Hyperlipidemia    Hypertension    IHD (ischemic heart disease)    Prior CABG in 2003   Normal nuclear stress test [facility] 2012   Peripheral vascular disease ([place])    Prostate cancer ([place])    Torn rotator cuff      Family History  Problem Relation Age of Onset   Heart attack Mother    Diabetes Mother    Heart disease Mother    Hypertension Mother    Heart attack Father    Heart disease Father    Hypertension Father    Diabetes Brother    Diabetes Sister    Diabetes Daughter    Heart disease Daughter    Hypertension Daughter    Diabetes Sister    Breast cancer Neg Hx    Prostate cancer Neg Hx    Colon cancer Neg Hx    Pancreatic cancer Neg Hx      Past Surgical History:  Procedure Laterality Date   ABDOMINAL AORTAGRAM N/A 12/21/2013   Procedure: ABDOMINAL Maxcine Ham;  Surgeon: Elam Dutch, MD;  Location: [facility] C[facility] LAB;  Service: Cardiovascular;  Laterality: N/A;   ABDOMINAL AORTOGRAM N/A 11/13/2017   Procedure: ABDOMINAL AORTOGRAM;  Surgeon: Elam Dutch, MD;  Location: [facility] CV LAB;  Service: Cardiovascular;  Laterality: N/A;   APPENDECTOMY     ARCH AORTOGRAM  08/26/2013   Procedure: ARCH AORTOGRAM;  Surgeon: Elam Dutch, MD;  Location: [facility] CATH  LAB;  Service: Cardiovascular;;   CARDIAC CATHETERIZATION  05/03/2002   EF 40-45%   CARDIOVASCULAR STRESS TEST  08/10/2006   EF 65%   CAROTID ENDARTERECTOMY     CAROTID STENT INSERTION Left 08/26/2013   Procedure: CAROTID STENT INSERTION;  Surgeon: Elam Dutch, MD;  Location: [facility] CATH LAB;  Service: Cardiovascular;  Laterality: Left;  internal carotid   CORONARY ARTERY BYPASS GRAFT  04/2002   DOPPLER ECHOCARDIOGRAPHY  08/12/2002   EF 80-85%   ENDARTERECTOMY Left 07/20/2013   Procedure: ATTEMPTED ENDARTERECTOMY CAROTID;  Surgeon: Conrad Hendry, MD;  Location: [facility];  Service: Vascular;  Laterality: Left;   HERNIA REPAIR     LOWER EXTREMITY ANGIOGRAPHY  11/13/2017   Procedure: Lower Extremity Angiography;  Surgeon: Elam Dutch, MD;  Location: [facility] CV LAB;  Service: Cardiovascular;;   PERIPHERAL VASCULAR INTERVENTION Right 11/13/2017   Procedure: PERIPHERAL VASCULAR INTERVENTION;  Surgeon: Elam Dutch, MD;  Location: [facility] CV LAB;  Service: Cardiovascular;  Laterality: Right;  superficial femoral   RIGHT HEART CATH N/A 09/23/2022   Procedure: RIGHT HEART CATH;  Surgeon: Jolaine Artist, MD;  Location: [place] CV LAB;  Service: Cardiovascular;  Laterality: N/A;  SHOULDER SURGERY      Social History   Socioeconomic History   Marital status: Married    Spouse name: Not on file   Number of children: 2   Years of education: Not on file   Highest education level: Not on file  Occupational History   Not on file  Tobacco Use   Smoking status: Former    Packs/day: 1.00    Years: 20.00    Total pack years: 20.00    Types: Cigarettes    Quit date: 10/27/2001    Years since quitting: 21.0    Passive exposure: Never   Smokeless tobacco: Never  Vaping Use   Vaping Use: Never used  Substance and Sexual Activity   Alcohol use: No    Alcohol/week: 0.0 standard drinks of alcohol   Drug use: No   Sexual activity: Not Currently  Other Topics Concern   Not on  file  Social History Narrative   Not on file   Social Determinants of Health   Financial Resource Strain: Not on file  Food Insecurity: No Food Insecurity (09/21/2022)   Hunger Vital Sign    Worried About Running Out of Food in the Last Year: Never true    Ran Out of Food in the Last Year: Never true  Transportation Needs: No Transportation Needs (09/21/2022)   PRAPARE - Hydrologist (Medical): No    Lack of Transportation (Non-Medical): No  Physical Activity: Not on file  Stress: Not on file  Social Connections: Not on file  Intimate Partner Violence: Not At Risk (09/21/2022)   Humiliation, Afraid, Rape, and Kick questionnaire    Fear of Current or Ex-Partner: No    Emotionally Abused: No    Physically Abused: No    Sexually Abused: No     Allergies  Allergen Reactions   Aldactone [Spironolactone] Other (See Comments)    Swollen breast   Lipitor [Atorvastatin Calcium] Other (See Comments)    Muscle ache   Sulfa Drugs Cross Reactors Other (See Comments)    "raw spots"   Sulfamethoxazole-Trimethoprim     Other reaction(s): Rash   Codeine Rash     Outpatient Medications Prior to Visit  Medication Sig Dispense Refill   albuterol (VENTOLIN HFA) 108 (90 Base) MCG/ACT inhaler Inhale 2 puffs into the lungs every 6 (six) hours as needed for wheezing or shortness of breath.     amiodarone (PACERONE) 200 MG tablet Take 1 tablet (200 mg total) by mouth daily. 30 tablet 0   apixaban (ELIQUIS) 5 MG TABS tablet Take 1 tablet (5 mg total) by mouth 2 (two) times daily. 60 tablet 8   Cholecalciferol (VITAMIN D-3) 125 MCG (5000 UT) TABS Take 5,000 Units by mouth daily.     clopidogrel (PLAVIX) 75 MG tablet Take 75 mg by mouth daily. Plavix was stopped on the 11th and removed from his medication list.  Pt was then placed on eliquis     dapagliflozin propanediol (FARXIGA) 10 MG TABS tablet Take 10 mg by mouth daily.     eplerenone (INSPRA) 25 MG tablet Take 25  mg by mouth 2 (two) times daily.     furosemide (LASIX) 40 MG tablet Take 40 mg by mouth.     insulin NPH Human (NOVOLIN N) 100 UNIT/ML injection Inject 35-40 Units into the skin See admin instructions. Inject 40 units into the skin in the morning and 35 units at night     insulin regular (NOVOLIN R)  100 units/mL injection Inject 6-15 Units into the skin 3 (three) times daily before meals. Per sliding scale     metformin (FORTAMET) 500 MG (OSM) 24 hr tablet Take 500 mg by mouth 2 (two) times daily with a meal.     nitroGLYCERIN (NITROSTAT) 0.4 MG SL tablet Place 1 tablet (0.4 mg total) under the tongue every 5 (five) minutes as needed for chest pain. 25 tablet 6   pantoprazole (PROTONIX) 40 MG tablet Take 40 mg by mouth daily.     rosuvastatin (CRESTOR) 20 MG tablet Take 1 tablet by mouth once daily (Patient taking differently: Take 20 mg by mouth daily.) 90 tablet 3   sodium zirconium cyclosilicate (LOKELMA) 10 g PACK packet Take 10 g by mouth daily for 3 days. 3 packet 0   No facility-administered medications prior to visit.       Objective:   Physical Exam:  General appearance: 76 y.o., male, NAD, conversant  Eyes: anicteric sclerae; PERRL, tracking appropriately HENT: NCAT; MMM Neck: Trachea midline; no lymphadenopathy, no JVD Lungs: CTAB, no crackles, no wheeze, with normal respiratory effort CV: RRR, no murmur  Abdomen: Soft, non-tender; non-distended, BS present  Extremities: No peripheral edema, warm Skin: Normal turgor and texture; no rash Psych: Appropriate affect Neuro: Alert and oriented to person and place, no focal deficit     There were no vitals filed for this visit.   on *** LPM *** RA BMI Readings from Last 3 Encounters:  10/22/22 23.43 kg/m  10/18/22 23.45 kg/m  10/07/22 23.93 kg/m   Wt Readings from Last 3 Encounters:  10/22/22 167 lb 15.9 oz (76.2 kg)  10/18/22 168 lb 1.6 oz (76.2 kg)  10/07/22 171 lb 9.6 oz (77.8 kg)     CBC    Component  Value Date/Time   WBC 7.3 10/18/2022 0130   RBC 4.21 (L) 10/18/2022 0130   HGB 13.9 10/22/2022 0717   HCT 41.0 10/22/2022 0717   PLT 186 10/18/2022 0130   MCV 88.6 10/18/2022 0130   MCH 28.0 10/18/2022 0130   MCHC 31.6 10/18/2022 0130   RDW 18.0 (H) 10/18/2022 0130   LYMPHSABS 1.4 10/17/2022 1510   MONOABS 0.5 10/17/2022 1510   EOSABS 0.2 10/17/2022 1510   BASOSABS 0.1 10/17/2022 1510    ***  Chest Imaging: CXR 09/20/22 with cardiomegaly, small effusions   Pulmonary Functions Testing Results:     No data to display            Echocardiogram 08/2022:    1. Left ventricular ejection fraction, by estimation, is 30 to 35%. The  left ventricle has moderately decreased function. The left ventricle  demonstrates regional wall motion abnormalities (see scoring  diagram/findings for description). There is mild  concentric left ventricular hypertrophy. Left ventricular diastolic  parameters are consistent with Grade II diastolic dysfunction  (pseudonormalization). There is the interventricular septum is flattened  in systole and diastole, consistent with right  ventricular pressure and volume overload.   2. Right ventricular systolic function is mildly reduced. The right  ventricular size is normal. There is mildly elevated pulmonary artery  systolic pressure. The estimated right ventricular systolic pressure is  12.2 mmHg.   3. Moderate pleural effusion in the left lateral region.   4. The mitral valve is grossly normal. Mild mitral valve regurgitation.   5. Tricuspid valve regurgitation is mild to moderate.   6. The aortic valve is tricuspid. There is moderate calcification of the  aortic valve. Aortic valve regurgitation is  mild. Aortic regurgitation PHT  measures 709 msec. Aortic valve mean gradient measures 3.5 mmHg.   7. The inferior vena cava is normal in size with <50% respiratory  variability, suggesting right atrial pressure of 8 mmHg.   Heart Catheterization  09/23/22:    RA = 8 RV = 48/10 PA = 45/20 (34) PCW = 13 Fick cardiac output/index = 6.0/3.0 Thermo CO/CI = 4.8/2.4  PVR = 3.3 WU (Fick) Ao sat = 90% PA sat = 55%, 58%     Assessment & Plan:    Plan:      Maryjane Hurter, MD [facility] 10/22/2022 5:57 PM

## 2022-10-22 NOTE — Discharge Instructions (Signed)

## 2022-10-23 ENCOUNTER — Ambulatory Visit: Payer: Medicare HMO | Admitting: Student

## 2022-10-23 ENCOUNTER — Encounter: Payer: Self-pay | Admitting: Student

## 2022-10-23 VITALS — BP 130/50 | HR 61 | Temp 97.8°F | Ht 71.0 in | Wt 173.8 lb

## 2022-10-23 DIAGNOSIS — R0609 Other forms of dyspnea: Secondary | ICD-10-CM

## 2022-10-23 NOTE — Patient Instructions (Signed)
-   Agree with getting RSV vaccine in a week or two - Let me know if you'd like [place] to order overnight oxygen testing to see if you still need O2 - Breathing tests and clinic visit in 8 weeks

## 2022-10-25 ENCOUNTER — Encounter (HOSPITAL_COMMUNITY): Payer: Self-pay | Admitting: Internal Medicine

## 2022-10-26 DIAGNOSIS — J9601 Acute respiratory failure with hypoxia: Secondary | ICD-10-CM | POA: Diagnosis not present

## 2022-10-27 DIAGNOSIS — E1142 Type 2 diabetes mellitus with diabetic polyneuropathy: Secondary | ICD-10-CM | POA: Diagnosis not present

## 2022-10-31 ENCOUNTER — Encounter (HOSPITAL_COMMUNITY): Payer: Self-pay | Admitting: Internal Medicine

## 2022-11-05 NOTE — Progress Notes (Incomplete)
ADVANCED HF CLINIC CONSULT NOTE  Primary Care: [name], [name], MD HF Cardiologist: Dr. Haroldine Laws  HPI: [name] is a 77 y.o. male with CAD c/p CABG 4098, chronic diastolic CHF, chronic DOE, Rt CEA >13yr ago, Lt CEA 14', HTN, DM2, HLD, PAD w/ Rt SFA and Rt politeal artery vascular stents 15', prostate cancer.    Admitted 111/91with a/c systolic heart failure, newly reduced EF. Echo this admission showed EF 30-35%, mild concentric LVH, GIIDD, RV function mildly reduced, mild MR, mild to mod TR, mild aortic valve regurg. Diuresed with IV lasix. Underwent RHC showing low filling pressures and preserved CO. GDMT started with Farxiga and Lasix. Other GDMT held with AKI. [name] was discharged home with oxygen, weight 172 lbs.  Today she returns for HF follow up. Overall feeling fine. [name] has mild SOB walking on flat ground. Poor energy. [name] snores but does not want a sleep study. Denies increasing SOB, CP, dizziness, edema, or PND/Orthopnea. Appetite ok. No fever or chills. Weight at home 168 pounds. Taking all medications. No tobacco/ETOH/drugs. Wearing 2L oxygen, has BiPap at home. Does not want to use it.  Cardiac Studies:   - RHC 11/23   RA = 8 RV = 48/10 PA = 45/20 (34) PCW = 13 Fick cardiac output/index = 6.0/3.0 Thermo CO/CI = 4.8/2.4  PVR = 3.3 WU (Fick) Ao sat = 90% PA sat = 55%, 58%    - Echo (11/23): EF 30-35%, mild concentric LVH, GIIDD, RV function mildly reduced, mild MR, mild to mod TR, mild aortic valve regurg  - Echo (9/22): EF 60-65%, normal RV function, LA mildly elevated, trivial MR/TR, mild-mod aortic valve regurg   Review of Systems: [y] = yes, '[ ]'$  = no   General: Weight gain '[ ]'$ ; Weight loss '[ ]'$ ; Anorexia '[ ]'$ ; Fatigue '[ ]'$ ; Fever '[ ]'$ ; Chills '[ ]'$ ; Weakness '[ ]'$   Cardiac: Chest pain/pressure '[ ]'$ ; Resting SOB '[ ]'$ ; Exertional SOB '[ ]'$ ; Orthopnea '[ ]'$ ; Pedal Edema '[ ]'$ ; Palpitations '[ ]'$ ; Syncope '[ ]'$ ; Presyncope '[ ]'$ ; Paroxysmal nocturnal dyspnea'[ ]'$   Pulmonary: Cough '[ ]'$ ;  Wheezing'[ ]'$ ; Hemoptysis'[ ]'$ ; Sputum '[ ]'$ ; Snoring '[ ]'$   GI: Vomiting'[ ]'$ ; Dysphagia'[ ]'$ ; Melena'[ ]'$ ; Hematochezia '[ ]'$ ; Heartburn'[ ]'$ ; Abdominal pain '[ ]'$ ; Constipation '[ ]'$ ; Diarrhea '[ ]'$ ; BRBPR '[ ]'$   GU: Hematuria'[ ]'$ ; Dysuria '[ ]'$ ; Nocturia'[ ]'$   Vascular: Pain in legs with walking '[ ]'$ ; Pain in feet with lying flat '[ ]'$ ; Non-healing sores '[ ]'$ ; Stroke '[ ]'$ ; TIA '[ ]'$ ; Slurred speech '[ ]'$ ;  Neuro: Headaches'[ ]'$ ; Vertigo'[ ]'$ ; Seizures'[ ]'$ ; Paresthesias'[ ]'$ ;Blurred vision '[ ]'$ ; Diplopia '[ ]'$ ; Vision changes '[ ]'$   Ortho/Skin: Arthritis '[ ]'$ ; Joint pain '[ ]'$ ; Muscle pain '[ ]'$ ; Joint swelling '[ ]'$ ; Back Pain '[ ]'$ ; Rash '[ ]'$   Psych: Depression'[ ]'$ ; Anxiety'[ ]'$   Heme: Bleeding problems '[ ]'$ ; Clotting disorders '[ ]'$ ; Anemia '[ ]'$   Endocrine: Diabetes [Blue.Reese]; Thyroid dysfunction'[ ]'$   Past Medical History:  Diagnosis Date   Arthritis    CAD (coronary artery disease)    Carotid artery occlusion    Diabetes mellitus    fasting 100-120s   Dizziness    Dysrhythmia    skips beats   GERD (gastroesophageal reflux disease)    History of kidney stones    Hyperlipidemia    Hypertension    IHD (ischemic heart disease)    Prior CABG in 2003   Normal nuclear stress test June 2012  Peripheral vascular disease (HCC)    Prostate cancer ([place])    Torn rotator cuff     Current Outpatient Medications  Medication Sig Dispense Refill   albuterol (VENTOLIN HFA) 108 (90 Base) MCG/ACT inhaler Inhale 2 puffs into the lungs every 6 (six) hours as needed for wheezing or shortness of breath.     amiodarone (PACERONE) 200 MG tablet Take 1 tablet (200 mg total) by mouth daily. 30 tablet 0   apixaban (ELIQUIS) 5 MG TABS tablet Take 1 tablet (5 mg total) by mouth 2 (two) times daily. 60 tablet 8   Cholecalciferol (VITAMIN D-3) 125 MCG (5000 UT) TABS Take 5,000 Units by mouth daily.     dapagliflozin propanediol (FARXIGA) 10 MG TABS tablet Take 10 mg by mouth daily. (Patient not taking: Reported on 10/23/2022)     eplerenone (INSPRA) 25 MG tablet Take 25 mg by  mouth 2 (two) times daily. (Patient not taking: Reported on 10/23/2022)     furosemide (LASIX) 40 MG tablet Take 40 mg by mouth daily as needed for edema.     insulin NPH Human (NOVOLIN N) 100 UNIT/ML injection Inject 35-40 Units into the skin See admin instructions. Inject 40 units into the skin in the morning and 35 units at night     insulin regular (NOVOLIN R) 100 units/mL injection Inject 6-15 Units into the skin 3 (three) times daily before meals. Per sliding scale     metformin (FORTAMET) 500 MG (OSM) 24 hr tablet Take 500 mg by mouth 2 (two) times daily with a meal. (Patient not taking: Reported on 10/23/2022)     nitroGLYCERIN (NITROSTAT) 0.4 MG SL tablet Place 1 tablet (0.4 mg total) under the tongue every 5 (five) minutes as needed for chest pain. 25 tablet 6   pantoprazole (PROTONIX) 40 MG tablet Take 40 mg by mouth daily.     rosuvastatin (CRESTOR) 20 MG tablet Take 1 tablet by mouth once daily (Patient taking differently: Take 20 mg by mouth daily.) 90 tablet 3   No current facility-administered medications for this visit.   Allergies  Allergen Reactions   Aldactone [Spironolactone] Other (See Comments)    Swollen breast   Lipitor [Atorvastatin Calcium] Other (See Comments)    Muscle ache   Sulfa Drugs Cross Reactors Other (See Comments)    "raw spots"   Sulfamethoxazole-Trimethoprim     Other reaction(s): Rash   Codeine Rash   Social History   Socioeconomic History   Marital status: Married    Spouse name: Not on file   Number of children: 2   Years of education: Not on file   Highest education level: Not on file  Occupational History   Not on file  Tobacco Use   Smoking status: Former    Packs/day: 1.00    Years: 20.00    Total pack years: 20.00    Types: Cigarettes    Quit date: 10/27/2001    Years since quitting: 21.0    Passive exposure: Never   Smokeless tobacco: Never  Vaping Use   Vaping Use: Never used  Substance and Sexual Activity   Alcohol use: No     Alcohol/week: 0.0 standard drinks of alcohol   Drug use: No   Sexual activity: Not Currently  Other Topics Concern   Not on file  Social History Narrative   Not on file   Social Determinants of Health   Financial Resource Strain: Not on file  Food Insecurity: No Food Insecurity (09/21/2022)  Hunger Vital Sign    Worried About Running Out of Food in the Last Year: Never true    Ran Out of Food in the Last Year: Never true  Transportation Needs: No Transportation Needs (09/21/2022)   PRAPARE - Hydrologist (Medical): No    Lack of Transportation (Non-Medical): No  Physical Activity: Not on file  Stress: Not on file  Social Connections: Not on file  Intimate Partner Violence: Not At Risk (09/21/2022)   Humiliation, Afraid, Rape, and Kick questionnaire    Fear of Current or Ex-Partner: No    Emotionally Abused: No    Physically Abused: No    Sexually Abused: No   Family History  Problem Relation Age of Onset   Heart attack Mother    Diabetes Mother    Heart disease Mother    Hypertension Mother    Heart attack Father    Heart disease Father    Hypertension Father    Diabetes Brother    Diabetes Sister    Diabetes Daughter    Heart disease Daughter    Hypertension Daughter    Diabetes Sister    Breast cancer Neg Hx    Prostate cancer Neg Hx    Colon cancer Neg Hx    Pancreatic cancer Neg Hx    There were no vitals taken for this visit.  Wt Readings from Last 3 Encounters:  10/23/22 78.8 kg (173 lb 12.8 oz)  10/22/22 76.2 kg (167 lb 15.9 oz)  10/18/22 76.2 kg (168 lb 1.6 oz)   PHYSICAL EXAM: General:  NAD. No resp difficulty, frail HEENT: Normal Neck: Supple. No JVD. Carotids 2+ bilat; no bruits. No lymphadenopathy or thryomegaly appreciated. Cor: PMI nondisplaced. Irregular rate & rhythm. No rubs, gallops or murmurs. Lungs: Clear Abdomen: Soft, nontender, nondistended. No hepatosplenomegaly. No bruits or masses. Good bowel  sounds. Extremities: No cyanosis, clubbing, rash, edema Neuro: Alert & oriented x 3, cranial nerves grossly intact. Moves all 4 extremities w/o difficulty. Affect pleasant.  ECG (personally reviewed): AFL 102 bpm  ASSESSMENT & PLAN: Chronic combined systolic and diastolic CHF, previously HFpEF now HFrEF - Echo 9/22: EF 60-65%, normal RV function, LA mildly elevated, trivial MR/TR, mild-mod aortic valve regurg  - Echo (11/23): EF 30-35%, mild concentric LVH, GIIDD, RV function mildly reduced, mild MR, mild to mod TR, mild aortic valve regurg - volume overloaded on admission suspect low output 2/2 ischemia?, recently treated PNA?, PVCs?  - RHC (11/23) with low filling pressures and preserved cardiac output.  - Today, NYHA II-early III, limited mostly by fatigue. Volume looks good today. - Continue Farxiga 10 mg daily. A1c 7.0% - Continue Lasix 40 mg daily. - Continue eplerenone 25 mg daily. - Continue amlodipine/benazepril for now. Plan to switch to [facility] soon. We discussed this today. - hold BB for now with recent acute exacerbation - Needs sleep study but [name] declines today. - Labs today.  2. AFL - New, rate controlled on ECG today. - ? If reason for newly decreased EF. - Asymptomatic - Stop ASA + Plavix. - Start Eliquis 5 mg bid. - Suspect [name] will not hold sinus w/o anti-arrhythmic.  - Start amiodarone 200 mg bid x 2 weeks, then decrease to 200 mg daily. Not ideal with COPD, CKD prohibits Tikosyn. - Arrange TEE/DCCV with Dr. Haroldine Laws next week. - Labs today. - Eventual sleep study if [name] will agree.  3. CAD s/p CABG  - Hx CABG 2003 - No chest pain -  As above, stop ASA and Plavix in setting of starting AC. - Continue statin - If EF does not improve on f/u echo, may need to consider outpatient coronary/CABG angiogram  4. CKD 3a - likely cardiorenal, baseline SCr. 1 - Avoid hypotension. - Add GDMT back slowly. - Labs today  5. Anemia - Iron deficient - Received  feraheme inpatient.  6. PAD - s/p intervention 15' Rt SFA and Rt politeal artery vascular stents - Denies claudication. - Stopping Plavix.  7. HTN - BP stable.  - Meds as above.  8. DM 2 - Hgb A1c 7.0 - [name] is on insulin. - Per PCP - Continue Wilder Glade.  9. COPD - has [place] new patient appointment scheduled for 12/28 - Recently treated for PNA - Continue home oxygen  10. Carotid artery stenosis - S/p b/l CEA - Continue statin.   Follow up with APP 2 weeks after TEE/DCCV. Plan to titrated GDMT and repeat echo in 3 months.  Allena Katz, FNP-BC 11/05/22  Patient seen and examined with the above-signed Advanced Practice Provider and/or Housestaff. I personally reviewed laboratory data, imaging studies and relevant notes. I independently examined the patient and formulated the important aspects of the plan. I have edited the note to reflect any of my changes or salient points. I have personally discussed the plan with the patient and/or family.  [name] is here for post-hospital f/u. Feels better. On home O2 now. NYHA III. Volume status ok. Found to be in new onset AF today  General:  Elderly man on O2. No resp difficulty HEENT: normal Neck: supple. no JVD. Carotids 2+ bilat; no bruits. No lymphadenopathy or thryomegaly appreciated. Cor: PMI nondisplaced. Irregular rate & rhythm. No rubs, gallops or murmurs. Lungs: decreased throughout Abdomen: soft, nontender, nondistended. No hepatosplenomegaly. No bruits or masses. Good bowel sounds. Extremities: no cyanosis, clubbing, rash, edema Neuro: alert & orientedx3, cranial nerves grossly intact. moves all 4 extremities w/o difficulty. Affect pleasant  Volume status improved. Now with new onset AF. Discussed need for [facility]. Will start amio and plan TEE/DC-CV next week.   If EF not improving will need to consider ischemic eval.   Rafael Bihari, FNP  9:56 AM

## 2022-11-07 ENCOUNTER — Ambulatory Visit (HOSPITAL_COMMUNITY)
Admission: RE | Admit: 2022-11-07 | Discharge: 2022-11-07 | Disposition: A | Discharge status: Home / Self Care | Payer: Medicare HMO | Source: Ambulatory Visit | Attending: Family Medicine | Admitting: Family Medicine

## 2022-11-07 VITALS — BP 150/70 | HR 57 | Wt 175.0 lb

## 2022-11-07 DIAGNOSIS — E875 Hyperkalemia: Secondary | ICD-10-CM | POA: Insufficient documentation

## 2022-11-07 DIAGNOSIS — I5022 Chronic systolic (congestive) heart failure: Secondary | ICD-10-CM | POA: Diagnosis not present

## 2022-11-07 DIAGNOSIS — Z951 Presence of aortocoronary bypass graft: Secondary | ICD-10-CM | POA: Diagnosis not present

## 2022-11-07 DIAGNOSIS — Z7984 Long term (current) use of oral hypoglycemic drugs: Secondary | ICD-10-CM | POA: Insufficient documentation

## 2022-11-07 DIAGNOSIS — J449 Chronic obstructive pulmonary disease, unspecified: Secondary | ICD-10-CM | POA: Insufficient documentation

## 2022-11-07 DIAGNOSIS — I10 Essential (primary) hypertension: Secondary | ICD-10-CM

## 2022-11-07 DIAGNOSIS — D631 Anemia in chronic kidney disease: Secondary | ICD-10-CM | POA: Diagnosis not present

## 2022-11-07 DIAGNOSIS — Z794 Long term (current) use of insulin: Secondary | ICD-10-CM | POA: Diagnosis not present

## 2022-11-07 DIAGNOSIS — I2510 Atherosclerotic heart disease of native coronary artery without angina pectoris: Secondary | ICD-10-CM | POA: Diagnosis not present

## 2022-11-07 DIAGNOSIS — N1831 Chronic kidney disease, stage 3a: Secondary | ICD-10-CM | POA: Insufficient documentation

## 2022-11-07 DIAGNOSIS — I25118 Atherosclerotic heart disease of native coronary artery with other forms of angina pectoris: Secondary | ICD-10-CM | POA: Diagnosis not present

## 2022-11-07 DIAGNOSIS — I480 Paroxysmal atrial fibrillation: Secondary | ICD-10-CM | POA: Diagnosis not present

## 2022-11-07 DIAGNOSIS — Z79899 Other long term (current) drug therapy: Secondary | ICD-10-CM | POA: Insufficient documentation

## 2022-11-07 DIAGNOSIS — Z7901 Long term (current) use of anticoagulants: Secondary | ICD-10-CM | POA: Insufficient documentation

## 2022-11-07 DIAGNOSIS — I6529 Occlusion and stenosis of unspecified carotid artery: Secondary | ICD-10-CM | POA: Insufficient documentation

## 2022-11-07 DIAGNOSIS — E1151 Type 2 diabetes mellitus with diabetic peripheral angiopathy without gangrene: Secondary | ICD-10-CM | POA: Diagnosis not present

## 2022-11-07 DIAGNOSIS — E1122 Type 2 diabetes mellitus with diabetic chronic kidney disease: Secondary | ICD-10-CM | POA: Diagnosis not present

## 2022-11-07 DIAGNOSIS — I5042 Chronic combined systolic (congestive) and diastolic (congestive) heart failure: Secondary | ICD-10-CM | POA: Diagnosis not present

## 2022-11-07 DIAGNOSIS — I130 Hypertensive heart and chronic kidney disease with heart failure and stage 1 through stage 4 chronic kidney disease, or unspecified chronic kidney disease: Secondary | ICD-10-CM | POA: Insufficient documentation

## 2022-11-07 MED ORDER — AMIODARONE HCL 200 MG PO TABS: 100.0000 mg | ORAL_TABLET | Freq: Every day | ORAL | 11 refills | Status: DC

## 2022-11-07 MED ORDER — AMLODIPINE BESYLATE 5 MG PO TABS: 5.0000 mg | ORAL_TABLET | Freq: Every day | ORAL | 11 refills | Status: DC

## 2022-11-07 NOTE — Progress Notes (Signed)
ADVANCED HF CLINIC CONSULT NOTE  Primary Care: [name], [name], MD HF Cardiologist: Dr. Haroldine Laws Pulmonary: Dr Verlee Monte  HPI: Mr. Brands is a 77 y.o. male with CAD c/p CABG 9735, chronic diastolic CHF, chronic DOE, Rt CEA >61yr ago, Lt CEA 14', HTN, DM2, HLD, PAD w/ Rt SFA and Rt politeal artery vascular stents 15', prostate cancer.    Admitted 132/99with a/c systolic heart failure, newly reduced EF. Echo this admission showed EF 30-35%, mild concentric LVH, GIIDD, RV function mildly reduced, mild MR, mild to mod TR, mild aortic valve regurg. Diuresed with IV lasix. Underwent RHC showing low filling pressures and preserved CO. GDMT started with Farxiga and Lasix. Other GDMT held with AKI. He was discharged home with oxygen, weight 172 lbs.  He was seen in the HF clinic 10/07/22. EKG showed new  aflutter. Started on eliquis and placed on amiodarone. Plan for TEE/DC-CV 10/22/22. Had blood work but it was not processed.   Had blood work with K >7.5.  Sent to ED and admitted on 10/17/22 with hyperkalemia and worsening renal function. Renal [place] for hydronephrosis. Lotrel, faxiga, eplerenone, lasix stopped. Discharged on 10/19/22.   10/22/22 S/P TEE/DC-CV--> EF 30-35% conversion to SR  Today he returns for HF follow up with his daughter. Feels the best  he has felt  in a long time. Feels much better after cardioversion. Denies SOB/PND/Orthopnea. Has been going to the grocery store. SBP at home 150-170. No bleeding issues. Appetite ok. No fever or chills. Taking all medications.   Cardiac Studies:  - RHC 11/23   RA = 8 RV = 48/10 PA = 45/20 (34) PCW = 13 Fick cardiac output/index = 6.0/3.0 Thermo CO/CI = 4.8/2.4  PVR = 3.3 WU (Fick) Ao sat = 90% PA sat = 55%, 58%   TEE 10/22/22 EF 30-35%    - Echo (11/23): EF 30-35%, mild concentric LVH, GIIDD, RV function mildly reduced, mild MR, mild to mod TR, mild aortic valve regurg  - Echo (9/22): EF 60-65%, normal RV function, LA mildly  elevated, trivial MR/TR, mild-mod aortic valve regurg    Past Medical History:  Diagnosis Date   Arthritis    CAD (coronary artery disease)    Carotid artery occlusion    Diabetes mellitus    fasting 100-120s   Dizziness    Dysrhythmia    skips beats   GERD (gastroesophageal reflux disease)    History of kidney stones    Hyperlipidemia    Hypertension    IHD (ischemic heart disease)    Prior CABG in 2003   Normal nuclear stress test June 2012   [place]ipheral vascular disease ([place]    Prostate cancer ([place]    Torn rotator cuff     Current Outpatient Medications  Medication Sig Dispense Refill   albuterol (VENTOLIN HFA) 108 (90 Base) MCG/ACT inhaler Inhale 2 puffs into the lungs every 6 (six) hours as needed for wheezing or shortness of breath.     amiodarone (PACERONE) 200 MG tablet Take 1 tablet (200 mg total) by mouth daily. 30 tablet 0   apixaban (ELIQUIS) 5 MG TABS tablet Take 1 tablet (5 mg total) by mouth 2 (two) times daily. 60 tablet 8   Cholecalciferol (VITAMIN D-3) 125 MCG (5000 UT) TABS Take 5,000 Units by mouth daily.     dapagliflozin propanediol (FARXIGA) 10 MG TABS tablet Take 10 mg by mouth daily.     furosemide (LASIX) 40 MG tablet Take 40 mg by mouth  daily as needed for edema.     insulin NPH Human (NOVOLIN N) 100 UNIT/ML injection Inject 35-40 Units into the skin See admin instructions. Inject 40 units into the skin in the morning and 35 units at night     insulin regular (NOVOLIN R) 100 units/mL injection Inject 6-15 Units into the skin 3 (three) times daily before meals. Per sliding scale     nitroGLYCERIN (NITROSTAT) 0.4 MG SL tablet Place 1 tablet (0.4 mg total) under the tongue every 5 (five) minutes as needed for chest pain. 25 tablet 6   pantoprazole (PROTONIX) 40 MG tablet Take 40 mg by mouth daily.     rosuvastatin (CRESTOR) 20 MG tablet Take 1 tablet by mouth once daily (Patient taking differently: Take 20 mg by mouth daily.) 90 tablet 3   No current  facility-administered medications for this encounter.   Allergies  Allergen Reactions   Aldactone [Spironolactone] Other (See Comments)    Swollen breast   Lipitor [Atorvastatin Calcium] Other (See Comments)    Muscle ache   Sulfa Drugs Cross Reactors Other (See Comments)    "raw spots"   Sulfamethoxazole-Trimethoprim     Other reaction(s): Rash   Codeine Rash   Social History   Socioeconomic History   Marital status: Married    Spouse name: Not on file   Number of children: 2   Years of education: Not on file   Highest education level: Not on file  Occupational History   Not on file  Tobacco Use   Smoking status: Former    Packs/day: 1.00    Years: 20.00    Total pack years: 20.00    Types: Cigarettes    Quit date: 10/27/2001    Years since quitting: 21.0    Passive exposure: Never   Smokeless tobacco: Never  Vaping Use   Vaping Use: Never used  Substance and Sexual Activity   Alcohol use: No    Alcohol/week: 0.0 standard drinks of alcohol   Drug use: No   Sexual activity: Not Currently  Other Topics Concern   Not on file  Social History Narrative   Not on file   Social Determinants of Health   Financial Resource Strain: Not on file  Food Insecurity: No Food Insecurity (09/21/2022)   Hunger Vital Sign    Worried About Running Out of Food in the Last Year: Never true    Ran Out of Food in the Last Year: Never true  Transportation Needs: No Transportation Needs (09/21/2022)   PRAPARE - Hydrologist (Medical): No    Lack of Transportation (Non-Medical): No  Physical Activity: Not on file  Stress: Not on file  Social Connections: Not on file  Intimate Partner Violence: Not At Risk (09/21/2022)   Humiliation, Afraid, Rape, and Kick questionnaire    Fear of Current or Ex-Partner: No    Emotionally Abused: No    Physically Abused: No    Sexually Abused: No   Family History  Problem Relation Age of Onset   Heart attack Mother     Diabetes Mother    Heart disease Mother    Hypertension Mother    Heart attack Father    Heart disease Father    Hypertension Father    Diabetes Brother    Diabetes Sister    Diabetes Daughter    Heart disease Daughter    Hypertension Daughter    Diabetes Sister    Breast cancer Neg Hx  Prostate cancer Neg Hx    Colon cancer Neg Hx    Pancreatic cancer Neg Hx    BP (!) 150/70   Pulse (!) 57   Wt 79.4 kg (175 lb)   SpO2 96%   BMI 24.41 kg/m   Wt Readings from Last 3 Encounters:  11/07/22 79.4 kg (175 lb)  10/23/22 78.8 kg (173 lb 12.8 oz)  10/22/22 76.2 kg (167 lb 15.9 oz)   PHYSICAL EXAM: General:  Walked in the clinic. Marland Kitchen No resp difficulty HEENT: normal Neck: supple. no JVD. Carotids 2+ bilat; no bruits. No lymphadenopathy or thryomegaly appreciated. Cor: PMI nondisplaced. Regular rate & rhythm. No rubs, gallops or murmurs. Lungs: clear Abdomen: soft, nontender, nondistended. No hepatosplenomegaly. No bruits or masses. Good bowel sounds. Extremities: no cyanosis, clubbing, rash, edema Neuro: alert & orientedx3, cranial nerves grossly intact. moves all 4 extremities w/o difficulty. Affect pleasant  ECG : Junctional Rhythm 55 bpm   ASSESSMENT & PLAN: Chronic combined systolic and diastolic CHF, previously HFpEF now HFrEF - Echo 9/22: EF 60-65%, normal RV function, LA mildly elevated, trivial MR/TR, mild-mod aortic valve regurg  - Echo (11/23): EF 30-35%, mild concentric LVH, GIIDD, RV function mildly reduced, mild MR, mild to mod TR, mild aortic valve regurg - volume overloaded on admission suspect low output 2/2 ischemia?, recently treated PNA?, PVCs?  - RHC (11/23) with low filling pressures and preserved cardiac output.  - NYHA II. Volume status stable Continue lasix as needed.  - Continue Farxiga 10 mg daily.  - No MRA or Acei/ARNi with hyperkalemia.  -  hold BB for now with recent acute exacerbation - Needs sleep study but he declines today.  2. AFL -  10/22/22 TEE/DC-CV ---> SR EKG today he remains in a regular rhythm.  - Cut back amiodarone to 100 mg daily. No ideal with COPD and will eventually place on beta selective BB---> Bisoprolol  - Continue  Eliquis 5 mg bid. - He does not want to pursue sleep study.    3. CAD s/p CABG  - Hx CABG 2003 - No chest pain - No aspirin and plavix.  On eliquis and crestor.  - If EF does not improve on f/u echo, may need to consider outpatient coronary/CABG angiogram  4. CKD 3a - likely cardiorenal, baseline recently has been 2SCr. 1 - Avoid hypotension. - 5. Anemia - Iron deficient - Received feraheme 08/2022 .  6. PAD - s/p intervention 15' Rt SFA and Rt politeal artery vascular stents - Denies claudication. - Stopping Plavix.  7. HTN - Elevated.  -Home BP also running high. 150-170s - Add 5 mg amlodipine daily. Previously on 10 mg amlodipine. Dicussed possible side effects.   8. DM 2 - Hgb A1c 7.0 - He is on insulin. - Continue Farxiga.  9. COPD - has [place] new patient appointment scheduled for 12/28 - Recently treated for PNA - Continue home oxygen  10. Carotid artery stenosis - S/p b/l CEA - Continue statin.  11. Hyperkalemia  Off MRA/ACEi due to recent hyperkalemia. Will keep off.   Recent BMET from 10/22/22 K 4.7    Follow up with pharmacy in 3 weeks.  He has follow up with Dr Haroldine Laws in March.   Johnwesley Lederman NP-C   11/07/22

## 2022-11-07 NOTE — Patient Instructions (Signed)
DECREASE Amiodarone to 100 mg (one half tab) daily START Norvasc 5 mg one tab daily   Your physician recommends that you schedule a follow-up appointment in: 4 weeks with the pharmacy team   Do the following things EVERYDAY: Weigh yourself in the morning before breakfast. Write it down and keep it in a log. Take your medicines as prescribed Eat low salt foods--Limit salt (sodium) to 2000 mg per day.  Stay as active as you can everyday Limit all fluids for the day to less than 2 liters  At the [facility], you and your health needs are our priority. As part of our continuing mission to provide you with exceptional heart care, we have created designated Provider Care Teams. These Care Teams include your primary Cardiologist (physician) and Advanced Practice Providers (APPs- Physician Assistants and Nurse Practitioners) who all work together to provide you with the care you need, when you need it.   You may see any of the following providers on your designated Care Team at your next follow up: Dr Glori Bickers Dr Loralie Champagne Dr. Roxana Hires, NP Lyda Jester, [place] [facility] [place], [place] Forestine Na, NP Audry Riles, PharmD   Please be sure to bring in all your medications bottles to every appointment.   If you have any questions or concerns before your next appointment please send [place] a message through [place] or call our office at [phone].    TO LEAVE A MESSAGE FOR THE NURSE SELECT OPTION 2, PLEASE LEAVE A MESSAGE INCLUDING: YOUR NAME DATE OF BIRTH CALL BACK NUMBER REASON FOR CALL**this is important as we prioritize the call backs  YOU WILL RECEIVE A CALL BACK THE SAME DAY AS LONG AS YOU CALL BEFORE 4:00 PM

## 2022-11-18 DIAGNOSIS — E1151 Type 2 diabetes mellitus with diabetic peripheral angiopathy without gangrene: Secondary | ICD-10-CM | POA: Diagnosis not present

## 2022-11-18 DIAGNOSIS — L03039 Cellulitis of unspecified toe: Secondary | ICD-10-CM | POA: Diagnosis not present

## 2022-11-19 ENCOUNTER — Other Ambulatory Visit (HOSPITAL_COMMUNITY): Payer: Self-pay

## 2022-11-26 DIAGNOSIS — J9601 Acute respiratory failure with hypoxia: Secondary | ICD-10-CM | POA: Diagnosis not present

## 2022-11-27 DIAGNOSIS — Z794 Long term (current) use of insulin: Secondary | ICD-10-CM | POA: Diagnosis not present

## 2022-11-27 DIAGNOSIS — I10 Essential (primary) hypertension: Secondary | ICD-10-CM | POA: Diagnosis not present

## 2022-11-27 DIAGNOSIS — Z7984 Long term (current) use of oral hypoglycemic drugs: Secondary | ICD-10-CM | POA: Diagnosis not present

## 2022-11-27 DIAGNOSIS — E785 Hyperlipidemia, unspecified: Secondary | ICD-10-CM | POA: Diagnosis not present

## 2022-11-27 DIAGNOSIS — E1142 Type 2 diabetes mellitus with diabetic polyneuropathy: Secondary | ICD-10-CM | POA: Diagnosis not present

## 2022-12-04 ENCOUNTER — Ambulatory Visit (HOSPITAL_COMMUNITY)
Admission: RE | Admit: 2022-12-04 | Discharge: 2022-12-04 | Disposition: A | Discharge status: Home / Self Care | Payer: Medicare HMO | Source: Ambulatory Visit | Attending: Cardiology | Admitting: Cardiology

## 2022-12-04 VITALS — BP 168/58 | HR 56 | Wt 181.0 lb

## 2022-12-04 DIAGNOSIS — E1122 Type 2 diabetes mellitus with diabetic chronic kidney disease: Secondary | ICD-10-CM | POA: Insufficient documentation

## 2022-12-04 DIAGNOSIS — E119 Type 2 diabetes mellitus without complications: Secondary | ICD-10-CM | POA: Diagnosis not present

## 2022-12-04 DIAGNOSIS — Z8546 Personal history of malignant neoplasm of prostate: Secondary | ICD-10-CM | POA: Diagnosis not present

## 2022-12-04 DIAGNOSIS — Z794 Long term (current) use of insulin: Secondary | ICD-10-CM | POA: Insufficient documentation

## 2022-12-04 DIAGNOSIS — I2510 Atherosclerotic heart disease of native coronary artery without angina pectoris: Secondary | ICD-10-CM | POA: Insufficient documentation

## 2022-12-04 DIAGNOSIS — I130 Hypertensive heart and chronic kidney disease with heart failure and stage 1 through stage 4 chronic kidney disease, or unspecified chronic kidney disease: Secondary | ICD-10-CM | POA: Insufficient documentation

## 2022-12-04 DIAGNOSIS — E875 Hyperkalemia: Secondary | ICD-10-CM | POA: Insufficient documentation

## 2022-12-04 DIAGNOSIS — R600 Localized edema: Secondary | ICD-10-CM | POA: Diagnosis not present

## 2022-12-04 DIAGNOSIS — Z951 Presence of aortocoronary bypass graft: Secondary | ICD-10-CM | POA: Diagnosis not present

## 2022-12-04 DIAGNOSIS — I739 Peripheral vascular disease, unspecified: Secondary | ICD-10-CM | POA: Insufficient documentation

## 2022-12-04 DIAGNOSIS — D509 Iron deficiency anemia, unspecified: Secondary | ICD-10-CM | POA: Diagnosis not present

## 2022-12-04 DIAGNOSIS — E785 Hyperlipidemia, unspecified: Secondary | ICD-10-CM | POA: Diagnosis not present

## 2022-12-04 DIAGNOSIS — J449 Chronic obstructive pulmonary disease, unspecified: Secondary | ICD-10-CM | POA: Insufficient documentation

## 2022-12-04 DIAGNOSIS — Z7984 Long term (current) use of oral hypoglycemic drugs: Secondary | ICD-10-CM | POA: Insufficient documentation

## 2022-12-04 DIAGNOSIS — R0602 Shortness of breath: Secondary | ICD-10-CM | POA: Insufficient documentation

## 2022-12-04 DIAGNOSIS — Z791 Long term (current) use of non-steroidal anti-inflammatories (NSAID): Secondary | ICD-10-CM | POA: Insufficient documentation

## 2022-12-04 DIAGNOSIS — N1831 Chronic kidney disease, stage 3a: Secondary | ICD-10-CM | POA: Diagnosis not present

## 2022-12-04 DIAGNOSIS — I5042 Chronic combined systolic (congestive) and diastolic (congestive) heart failure: Secondary | ICD-10-CM

## 2022-12-04 DIAGNOSIS — I6529 Occlusion and stenosis of unspecified carotid artery: Secondary | ICD-10-CM | POA: Insufficient documentation

## 2022-12-04 DIAGNOSIS — Z7901 Long term (current) use of anticoagulants: Secondary | ICD-10-CM | POA: Diagnosis not present

## 2022-12-04 DIAGNOSIS — Z79899 Other long term (current) drug therapy: Secondary | ICD-10-CM | POA: Insufficient documentation

## 2022-12-04 LAB — BASIC METABOLIC PANEL
Anion gap: 9 (ref 5–15)
BUN: 27 mg/dL — ABNORMAL HIGH (ref 8–23)
CO2: 23 mmol/L (ref 22–32)
Calcium: 9.2 mg/dL (ref 8.9–10.3)
Chloride: 106 mmol/L (ref 98–111)
Creatinine, Ser: 1.42 mg/dL — ABNORMAL HIGH (ref 0.61–1.24)
GFR, Estimated: 52 mL/min — ABNORMAL LOW (ref >60)
Glucose, Bld: 127 mg/dL — ABNORMAL HIGH (ref 70–99)
Potassium: 4.2 mmol/L (ref 3.5–5.1)
Sodium: 138 mmol/L (ref 135–145)

## 2022-12-04 MED ORDER — HYDRALAZINE HCL 50 MG PO TABS: 50.0000 mg | ORAL_TABLET | Freq: Three times a day (TID) | ORAL | 3 refills | Status: DC

## 2022-12-04 MED ORDER — ISOSORBIDE MONONITRATE ER 30 MG PO TB24: 30.0000 mg | ORAL_TABLET | Freq: Every day | ORAL | 3 refills | Status: DC

## 2022-12-04 NOTE — Progress Notes (Signed)
Advanced Heart Failure Clinic Note   Primary Care: [name], [name], MD HF Cardiologist: Dr. Haroldine Laws Pulmonary: Dr Verlee Monte  HPI:  [name] is a 77 y.o. male with CAD c/p CABG 8182, chronic diastolic CHF, chronic DOE, Rt CEA >33yr ago, Lt CEA 14', HTN, DM2, HLD, PAD w/ Rt SFA and Rt popliteal artery vascular stents 15', prostate cancer.    Admitted 199/3716with a/c systolic heart failure, newly reduced EF. Echo this admission showed EF 30-35%, mild concentric LVH, GIIDD, RV function mildly reduced, mild MR, mild to mod TR, mild aortic valve regurg. Diuresed with IV Lasix. Underwent RHC showing low filling pressures and preserved CO. GDMT started with Farxiga and Lasix. Other GDMT held with AKI. He was discharged home with oxygen, weight 172 lbs.   He was seen in the HF clinic 10/07/22. EKG showed new aflutter. Started on eliquis and placed on amiodarone. Plan for TEE/DC-CV 10/22/22. Had blood work but it was not processed.    Had blood work with K >7.5.  Sent to ED and admitted on 10/17/22 with hyperkalemia and worsening renal function. Renal [place]eg[place] for hydronephrosis. Lotrel, Faxiga, eplerenone, Lasix stopped. Discharged on 10/19/22.    10/22/22 S/P TEE/DC-CV --> EF 30-35% conversion to SR   Last seen by HF clinic on 11/07/22. Felt much better after cardioversion. No symptoms. SBP at home 150-170. Amlodipine 5 mg daily was started and amiodarone was decreased.  Today he returns with his daughter to HF clinic for pharmacist medication titration. Overall feeling decent and slightly improved in the last 2 weeks and significantly since his hospitalization. Denies dizziness and lightheadedness. Reports chronic fatigue, unchanged from previous. Denies chest pain and palpitations. Breathing has been fine. SOB usually 2-3 times per week. Able to walk flat surfaces without symptoms. Able to complete all ADLs. Not very active at home, likes to do jigsaw puzzles. Weight at home normally 171 lbs,  but up recently to 176 lbs this AM. He takes Lasix 40 mg prn, usually 2 times per week. No overt LEE, he reports mild swelling on top of his feet. Notable abdominal edema. Denies PND/Orthopnea. Appetite is great. Adheres strictly to low-salt diet:    Home BP:   BP HR  11/27/2022 152/67 52  11/28/2022 157/52 56  11/29/2022 155/70 55  11/30/2022 159/63 50  12/01/2022 152/51 53  12/02/2022 156/57 54  12/03/2022 148/58 62  12/04/2022 174/55 47   HF Medications: Farxiga 10 mg daily Lasix 40 mg PRN  Has the patient been experiencing any side effects to the medications prescribed?  No  Does the patient have any problems obtaining medications due to transportation or finances?   No; [employer] Medicare  Understanding of regimen: good Understanding of indications: good Potential of compliance: good Patient understands to avoid NSAIDs. Patient understands to avoid decongestants.    Pertinent Lab Values (10/21/22): Serum creatinine 1.67, BUN 32, Potassium 5, Sodium 140  Vital Signs: Weight: 181 lbs (last clinic weight: 175 lbs) Blood pressure: 168/58 mmHg  Heart rate: 56 bpm   Assessment/Plan: Chronic combined systolic and diastolic CHF, previously HFpEF now HFrEF - Echo 06/2021: EF 60-65%, normal RV function, LA mildly elevated, trivial MR/TR, mild-mod aortic valve regurg  - Echo (08/2022): EF 30-35%, mild concentric LVH, GIIDD, RV function mildly reduced, mild MR, mild to mod TR, mild aortic valve regurg - Volume overloaded on admission suspect low output 2/2 ischemia?, recently treated PNA?, PVCs?  - RHC (08/2022) with low filling pressures and preserved cardiac output.  -  NYHA II. Volume mildly up today. Take Lasix 40 mg today and tomorrow, then continue Lasix as needed.  - Hold BB for now with recent exacerbation and bradycardia. If started, bisoprolol may be ideal to prevent COPD exacerbations. - No MRA or ACEi/ARNi with hyperkalemia. If ACE/ARB is initiated in the future, may require potassium  binder. [place] copay $47. [place] copay $100, but would likely qualify for patient assistance. BMET today pending, will need close monitoring. - Continue Farxiga 10 mg daily.  - Start isosorbide mononitrate 30 mg daily + hydralazine 50 mg three times daily. BP up, taking amlodipine without much results. Patient wants to use home supply of hydralazine 100 mg tablets. Instructed to take 1/2 tablet three times per day of the old supply or 1 tablet of the new supply.    2. AFL - 10/22/22 TEE/DC-CV ---> SR - Continue amiodarone 100 mg daily. Not ideal with COPD  - Continue Eliquis 5 mg BID.   3. CAD s/p CABG  - Hx CABG 2003 - No chest pain - No aspirin and plavix.  On Eliquis and Crestor.  - If EF does not improve on f/u echo, may need to consider outpatient coronary/CABG angiogram   4. CKD 3a - Llikely cardiorenal. - Avoid hypotension and hypertension. -BMET today to monitor creatinine and potassium  5. Anemia - Iron deficient - Received feraheme 08/2022 .   6. PAD - s/p intervention 15' Rt SFA and Rt politeal artery vascular stents - Denies claudication.   7. HTN - BP elevated. Start hydralazine/Imdur added as above..  - Home BP continues to running high in 150-170s despite amlodipine   8. DM 2 - Hgb A1c 7.0 - He is on insulin. - Continue Farxiga.   9. COPD - Recently treated for PNA - Followed with [facility].   10. Carotid artery stenosis - S/p b/l CEA - Continue statin.   11. Hyperkalemia  - Off MRA/ACEi due to recent hyperkalemia. Will keep off.  - BMET today   He has follow up with Dr Haroldine Laws in March.    Audry Riles, PharmD, BCPS, BCCP, CPP Heart Failure Clinic Pharmacist [phone]

## 2022-12-04 NOTE — Patient Instructions (Addendum)
It was a pleasure seeing you today!  MEDICATIONS: -We are changing your medications today -Start hydralazine 50 mg three times daily + isosorbide mononitrate (Imdur) 30 mg daily - can use 1/2 tablet of your old hydralazine 100 mg tablet -Call if you have questions about your medications.  LABS: -We will call you if your labs need attention.  NEXT APPOINTMENT: Return to clinic 3/8 with Dr. Haroldine Laws.  In general, to take care of your heart failure: -Limit your fluid intake to 2 Liters (half-gallon) per day.   -Limit your salt intake to ideally 2-3 grams (2000-3000 mg) per day. -Weigh yourself daily and record, and bring that "weight diary" to your next appointment.  (Weight gain of 2-3 pounds in 1 day typically means fluid weight.) -The medications for your heart are to help your heart and help you live longer.   -Please contact us before stopping any of your heart medications.  Call the clinic at [phone] with questions or to reschedule future appointments.

## 2022-12-05 ENCOUNTER — Telehealth (HOSPITAL_COMMUNITY): Payer: Self-pay | Admitting: Pharmacist

## 2022-12-05 NOTE — Telephone Encounter (Signed)
Spoke with patient's daughter per his request to discuss lab results. Potassium and creatinine are trending down. Continue current medications.

## 2022-12-08 ENCOUNTER — Other Ambulatory Visit: Payer: Self-pay | Admitting: *Deleted

## 2022-12-08 DIAGNOSIS — I6523 Occlusion and stenosis of bilateral carotid arteries: Secondary | ICD-10-CM

## 2022-12-08 DIAGNOSIS — I739 Peripheral vascular disease, unspecified: Secondary | ICD-10-CM

## 2022-12-10 ENCOUNTER — Encounter (HOSPITAL_COMMUNITY): Payer: Self-pay

## 2022-12-15 ENCOUNTER — Ambulatory Visit (HOSPITAL_COMMUNITY)
Admission: RE | Admit: 2022-12-15 | Discharge: 2022-12-15 | Disposition: A | Discharge status: Home / Self Care | Payer: Medicare HMO | Source: Ambulatory Visit | Attending: Vascular Surgery | Admitting: Vascular Surgery

## 2022-12-15 ENCOUNTER — Ambulatory Visit (INDEPENDENT_AMBULATORY_CARE_PROVIDER_SITE_OTHER)
Admission: RE | Admit: 2022-12-15 | Discharge: 2022-12-15 | Disposition: A | Discharge status: Home / Self Care | Payer: Medicare HMO | Source: Ambulatory Visit | Attending: Vascular Surgery | Admitting: Vascular Surgery

## 2022-12-15 DIAGNOSIS — I6523 Occlusion and stenosis of bilateral carotid arteries: Secondary | ICD-10-CM | POA: Insufficient documentation

## 2022-12-15 DIAGNOSIS — I739 Peripheral vascular disease, unspecified: Secondary | ICD-10-CM | POA: Insufficient documentation

## 2022-12-16 ENCOUNTER — Ambulatory Visit: Payer: Medicare HMO | Admitting: Physician Assistant

## 2022-12-16 ENCOUNTER — Encounter: Payer: Self-pay | Admitting: Physician Assistant

## 2022-12-16 VITALS — BP 175/65 | HR 77 | Temp 97.5°F | Resp 20 | Ht 71.0 in | Wt 181.7 lb

## 2022-12-16 DIAGNOSIS — I739 Peripheral vascular disease, unspecified: Secondary | ICD-10-CM

## 2022-12-16 DIAGNOSIS — I6523 Occlusion and stenosis of bilateral carotid arteries: Secondary | ICD-10-CM

## 2022-12-16 LAB — VAS US ABI WITH/WO TBI
Left ABI: 0.49
Right ABI: 0.57

## 2022-12-16 NOTE — Progress Notes (Signed)
Office Note     CC:  follow up Requesting Provider:  Haywood Pao, MD  HPI: [name] is a 77 y.o. (July 05, 1946) male who presents for routine follow up of PAD and carotid artery stenosis.  He has remote history of left ICA stent by Dr. Oneida Alar on 08/26/13. He has no history of TIA or stroke. Today he denies amaurosis fugax or visual changes. He says he actually feels like his eye sight is getting better. He denies any slurred speech, facial drooping, unilateral upper or lower extremity weakness or numbness. His cardiologist recently discontinued his Plavix and Aspirin and he is now on Eliquis.   Regarding his PAD he has remote history of right SFA stent 12/21/13 for CLI with non healing wound. He did have to have Angiogram of the RLE in 2019 for in stent restenosis. He has had claudication bilaterally but not lifestyle limiting. Left has been worse than the right. Today this is  essentially unchanged. He says he has to walk a pretty good distance before he gets discomfort and if he stops and rests just 30 seconds it goes away and it will not reoccur. He has not had any rest pain or non healing wounds. He usually wears compression stockings daily with intermittent swelling. He also takes Furosemide as needed if swelling is increased.   The pt is on a statin for cholesterol management The pt is not on a daily aspirin.   Other AC:  Eliquis The pt is on CCB, BB for hypertension.   The pt is diabetic.   Tobacco hx:  former  Past Medical History:  Diagnosis Date   Arthritis    CAD (coronary artery disease)    Carotid artery occlusion    Diabetes mellitus    fasting 100-120s   Dizziness    Dysrhythmia    skips beats   GERD (gastroesophageal reflux disease)    History of kidney stones    Hyperlipidemia    Hypertension    IHD (ischemic heart disease)    Prior CABG in 2003   Normal nuclear stress test June 2012   Peripheral vascular disease ([place])    Prostate cancer ([place])    Torn  rotator cuff     Past Surgical History:  Procedure Laterality Date   ABDOMINAL AORTAGRAM N/A 12/21/2013   Procedure: ABDOMINAL Maxcine Ham;  Surgeon: Elam Dutch, MD;  Location: [facility] CATH LAB;  Service: Cardiovascular;  Laterality: N/A;   ABDOMINAL AORTOGRAM N/A 11/13/2017   Procedure: ABDOMINAL AORTOGRAM;  Surgeon: Elam Dutch, MD;  Location: [place] CV LAB;  Service: Cardiovascular;  Laterality: N/A;   APPENDECTOMY     ARCH AORTOGRAM  08/26/2013   Procedure: ARCH AORTOGRAM;  Surgeon: Elam Dutch, MD;  Location[facility] CATH LAB;  Service: Cardiovascular;;   CARDIAC CATHETERIZATION  05/03/2002   EF 40-45%   CARDIOVASCULAR STRESS TEST  08/10/2006   EF 65%   CARDIOVERSION N/A 10/22/2022   Procedure: CARDIOVERSION;  Surgeon: Jolaine Artist, MD;  Location: [place];  Service: Cardiovascular;  Laterality: N/A;   CAROTID ENDARTERECTOMY     CAROTID STENT INSERTION Left 08/26/2013   Procedure: CAROTID STENT INSERTION;  Surgeon: Elam Dutch, MD;  Location[facility] CATH LAB;  Service: Cardiovascular;  Laterality: Left;  internal carotid   CORONARY ARTERY BYPASS GRAFT  04/2002   DOPPLER ECHOCARDIOGRAPHY  08/12/2002   EF 80-85%   ENDARTERECTOMY Left 07/20/2013   Procedure: ATTEMPTED ENDARTERECTOMY CAROTID;  Surgeon: Conrad Astoria, MD;  Location: [facility] OR;  Service: Vascular;  Laterality: Left;   HERNIA REPAIR     LOWER EXTREMITY ANGIOGRAPHY  11/13/2017   Procedure: Lower Extremity Angiography;  Surgeon: Elam Dutch, MD;  Location: [place] CV LAB;  Service: Cardiovascular;;   PERIPHERAL VASCULAR INTERVENTION Right 11/13/2017   Procedure: PERIPHERAL VASCULAR INTERVENTION;  Surgeon: Elam Dutch, MD;  Location: [place] CV LAB;  Service: Cardiovascular;  Laterality: Right;  superficial femoral   RIGHT HEART CATH N/A 09/23/2022   Procedure: RIGHT HEART CATH;  Surgeon: Jolaine Artist, MD;  Location: [place] CV LAB;  Service: Cardiovascular;  Laterality: N/A;    SHOULDER SURGERY     TEE WITHOUT CARDIOVERSION N/A 10/22/2022   Procedure: TRANSESOPHAGEAL ECHOCARDIOGRAM (TEE);  Surgeon: Jolaine Artist, MD;  Location: [facility] ENDOSCOPY;  Service: Cardiovascular;  Laterality: N/A;    Social History   Socioeconomic History   Marital status: Married    Spouse name: Not on file   Number of children: 2   Years of education: Not on file   Highest education level: Not on file  Occupational History   Not on file  Tobacco Use   Smoking status: Former    Packs/day: 1.00    Years: 20.00    Total pack years: 20.00    Types: Cigarettes    Quit date: 10/27/2001    Years since quitting: 21.1    Passive exposure: Never   Smokeless tobacco: Never  Vaping Use   Vaping Use: Never used  Substance and Sexual Activity   Alcohol use: No    Alcohol/week: 0.0 standard drinks of alcohol   Drug use: No   Sexual activity: Not Currently  Other Topics Concern   Not on file  Social History Narrative   Not on file   Social Determinants of Health   Financial Resource Strain: Not on file  Food Insecurity: No Food Insecurity (09/21/2022)   Hunger Vital Sign    Worried About Running Out of Food in the Last Year: Never true    Ran Out of Food in the Last Year: Never true  Transportation Needs: No Transportation Needs (09/21/2022)   PRAPARE - Hydrologist (Medical): No    Lack of Transportation (Non-Medical): No  Physical Activity: Not on file  Stress: Not on file  Social Connections: Not on file  Intimate Partner Violence: Not At Risk (09/21/2022)   Humiliation, Afraid, Rape, and Kick questionnaire    Fear of Current or Ex-Partner: No    Emotionally Abused: No    Physically Abused: No    Sexually Abused: No    Family History  Problem Relation Age of Onset   Heart attack Mother    Diabetes Mother    Heart disease Mother    Hypertension Mother    Heart attack Father    Heart disease Father    Hypertension Father    Diabetes  Brother    Diabetes Sister    Diabetes Daughter    Heart disease Daughter    Hypertension Daughter    Diabetes Sister    Breast cancer Neg Hx    Prostate cancer Neg Hx    Colon cancer Neg Hx    Pancreatic cancer Neg Hx     Current Outpatient Medications  Medication Sig Dispense Refill   albuterol (VENTOLIN HFA) 108 (90 Base) MCG/ACT inhaler Inhale 2 puffs into the lungs every 6 (six) hours as needed for wheezing or shortness of breath.  amiodarone (PACERONE) 200 MG tablet Take 0.5 tablets (100 mg total) by mouth daily. 15 tablet 11   amLODipine (NORVASC) 5 MG tablet Take 1 tablet (5 mg total) by mouth daily. 30 tablet 11   apixaban (ELIQUIS) 5 MG TABS tablet Take 1 tablet (5 mg total) by mouth 2 (two) times daily. 60 tablet 8   Cholecalciferol (VITAMIN D-3) 125 MCG (5000 UT) TABS Take 5,000 Units by mouth daily.     dapagliflozin propanediol (FARXIGA) 10 MG TABS tablet Take 10 mg by mouth daily.     furosemide (LASIX) 40 MG tablet Take 40 mg by mouth daily as needed for edema.     hydrALAZINE (APRESOLINE) 50 MG tablet Take 1 tablet (50 mg total) by mouth 3 (three) times daily. 270 tablet 3   insulin NPH Human (NOVOLIN N) 100 UNIT/ML injection Inject 35-40 Units into the skin See admin instructions. Inject 40 units into the skin in the morning and 35 units at night     insulin regular (NOVOLIN R) 100 units/mL injection Inject 6-15 Units into the skin 3 (three) times daily before meals. Per sliding scale     isosorbide mononitrate (IMDUR) 30 MG 24 hr tablet Take 1 tablet (30 mg total) by mouth daily. 90 tablet 3   nitroGLYCERIN (NITROSTAT) 0.4 MG SL tablet Place 1 tablet (0.4 mg total) under the tongue every 5 (five) minutes as needed for chest pain. 25 tablet 6   pantoprazole (PROTONIX) 40 MG tablet Take 20 mg by mouth daily.     rosuvastatin (CRESTOR) 20 MG tablet Take 1 tablet by mouth once daily 90 tablet 3   No current facility-administered medications for this visit.     Allergies  Allergen Reactions   Aldactone [Spironolactone] Other (See Comments)    Swollen breast   Lipitor [Atorvastatin Calcium] Other (See Comments)    Muscle ache   Sulfa Drugs Cross Reactors Other (See Comments)    "raw spots"   Sulfamethoxazole-Trimethoprim     Other reaction(s): Rash   Codeine Rash     REVIEW OF SYSTEMS:   [X]$  denotes positive finding, [ ]$  denotes negative finding Cardiac  Comments:  Chest pain or chest pressure:    Shortness of breath upon exertion:    Short of breath when lying flat:    Irregular heart rhythm:        Vascular    Pain in calf, thigh, or hip brought on by ambulation:    Pain in feet at night that wakes you up from your sleep:     Blood clot in your veins:    Leg swelling:         Pulmonary    Oxygen at home:    Productive cough:     Wheezing:         Neurologic    Sudden weakness in arms or legs:     Sudden numbness in arms or legs:     Sudden onset of difficulty speaking or slurred speech:    Temporary loss of vision in one eye:     Problems with dizziness:         Gastrointestinal    Blood in stool:     Vomited blood:         Genitourinary    Burning when urinating:     Blood in urine:        Psychiatric    Major depression:         Hematologic    Bleeding  problems:    Problems with blood clotting too easily:        Skin    Rashes or ulcers:        Constitutional    Fever or chills:      PHYSICAL EXAMINATION:  Vitals:   12/16/22 1333 12/16/22 1336  BP: (!) 187/70 (!) 175/65  Pulse: 77   Resp: 20   Temp: (!) 97.5 F (36.4 C)   TempSrc: Temporal   SpO2: 97%   Weight: 181 lb 11.2 oz (82.4 kg)   Height: 5' 11"$  (1.803 m)     General:  WDWN in NAD; vital signs documented above Gait: Normal HENT: WNL, normocephalic Pulmonary: normal non-labored breathing , without wheezing Cardiac: regular HR, without  Murmurs without carotid bruit Abdomen: soft, NT/ ND Vascular Exam/Pulses:  Right Left   Radial 2+ (normal) 2+ (normal)  Femoral 2+ (normal) 2+ (normal)  Popliteal Not palpable Not palpable  DP absent absent  PT 1+ (weak) absent   Extremities: without ischemic changes, without Gangrene , without cellulitis; without open wounds;  Musculoskeletal: no muscle wasting or atrophy  Neurologic: A&O X 3;  No focal weakness or paresthesias are detected Psychiatric:  The pt has Normal affect.   Non-Invasive Vascular Imaging:   +-------+-----------+-----------+------------+------------+  ABI/TBIToday's ABIToday's TBIPrevious ABIPrevious TBI  +-------+-----------+-----------+------------+------------+  Right 0.57       0.27       0.65        0.12          +-------+-----------+-----------+------------+------------+  Left  0.49       0.15       0.49        0             +-------+-----------+-----------+------------+------------+   VAS US Carotid duplex: Summary:  Right Carotid:  Right ICA PSV > 200cm/s suggests 60-79% stenosis. Innominate artery PSV 316cm/s suggestive of stenosis. Non-hemodynamically significant plaque <50% noted in the  CCA. The ECA appears >50% stenosed.   Left Carotid: Non-hemodynamically significant plaque <50% noted in the CCA. The ECA appears >50% stenosed. Left ICA Stent PSV >300 suggests >75% stenosis however ICA stent EDV <125cm/s suggests 50-75% stenosis.   Vertebrals: Bilateral vertebral arteries demonstrate antegrade flow. Subclavians: Right subclavian artery was stenotic. Normal flow hemodynamics were seen in the left subclavian artery.   ASSESSMENT/PLAN:: 77 y.o. male here for follow up for routine follow up of PAD and carotid artery stenosis.   #1 Carotid artery disease - There continues to be moderate in stent restenosis of left carotid stent on duplex today.The velocities are essentially unchanged from 6 months ago. Right ICA now with 60-79% stenosis based on duplex today. He remains without associated symptoms.  - Continue statin  and Eliquis - Will have him return in 6 months with repeat carotid duplex - reviewed signs and symptoms of TIA/ Stroke and he understands should these occur that he will seek immediate medical attention   #2 PAD - he is having claudication symptoms BLE , Left > Right. This is not lifestyle limiting. I have encouraged him to continue to ambulate as much as possible - His ABIs are essentially unchanged from 1 year ago - he understands to call for earlier follow up if he has new or worsening symptoms - He will follow up with ABI in 6 months - Continue Statin and Eliquis    Karoline Caldwell, PA-C [facility] [phone]  Clinic MD:   Roxanne Mins

## 2022-12-20 ENCOUNTER — Other Ambulatory Visit: Payer: Self-pay

## 2022-12-20 DIAGNOSIS — I6523 Occlusion and stenosis of bilateral carotid arteries: Secondary | ICD-10-CM

## 2022-12-20 DIAGNOSIS — I739 Peripheral vascular disease, unspecified: Secondary | ICD-10-CM

## 2022-12-22 DIAGNOSIS — I110 Hypertensive heart disease with heart failure: Secondary | ICD-10-CM | POA: Diagnosis not present

## 2022-12-22 DIAGNOSIS — E7800 Pure hypercholesterolemia, unspecified: Secondary | ICD-10-CM | POA: Diagnosis not present

## 2022-12-22 DIAGNOSIS — Z1212 Encounter for screening for malignant neoplasm of rectum: Secondary | ICD-10-CM | POA: Diagnosis not present

## 2022-12-22 DIAGNOSIS — D649 Anemia, unspecified: Secondary | ICD-10-CM | POA: Diagnosis not present

## 2022-12-22 DIAGNOSIS — E1151 Type 2 diabetes mellitus with diabetic peripheral angiopathy without gangrene: Secondary | ICD-10-CM | POA: Diagnosis not present

## 2022-12-22 DIAGNOSIS — E559 Vitamin D deficiency, unspecified: Secondary | ICD-10-CM | POA: Diagnosis not present

## 2022-12-22 DIAGNOSIS — D509 Iron deficiency anemia, unspecified: Secondary | ICD-10-CM | POA: Diagnosis not present

## 2022-12-22 DIAGNOSIS — E538 Deficiency of other specified B group vitamins: Secondary | ICD-10-CM | POA: Diagnosis not present

## 2022-12-22 DIAGNOSIS — R7989 Other specified abnormal findings of blood chemistry: Secondary | ICD-10-CM | POA: Diagnosis not present

## 2022-12-25 DIAGNOSIS — J9601 Acute respiratory failure with hypoxia: Secondary | ICD-10-CM | POA: Diagnosis not present

## 2022-12-26 DIAGNOSIS — E1142 Type 2 diabetes mellitus with diabetic polyneuropathy: Secondary | ICD-10-CM | POA: Diagnosis not present

## 2022-12-29 DIAGNOSIS — Z1331 Encounter for screening for depression: Secondary | ICD-10-CM | POA: Diagnosis not present

## 2022-12-29 DIAGNOSIS — I6529 Occlusion and stenosis of unspecified carotid artery: Secondary | ICD-10-CM | POA: Diagnosis not present

## 2022-12-29 DIAGNOSIS — I130 Hypertensive heart and chronic kidney disease with heart failure and stage 1 through stage 4 chronic kidney disease, or unspecified chronic kidney disease: Secondary | ICD-10-CM | POA: Diagnosis not present

## 2022-12-29 DIAGNOSIS — N1832 Chronic kidney disease, stage 3b: Secondary | ICD-10-CM | POA: Diagnosis not present

## 2022-12-29 DIAGNOSIS — I272 Pulmonary hypertension, unspecified: Secondary | ICD-10-CM | POA: Diagnosis not present

## 2022-12-29 DIAGNOSIS — J449 Chronic obstructive pulmonary disease, unspecified: Secondary | ICD-10-CM | POA: Diagnosis not present

## 2022-12-29 DIAGNOSIS — Z9981 Dependence on supplemental oxygen: Secondary | ICD-10-CM | POA: Diagnosis not present

## 2022-12-29 DIAGNOSIS — I5043 Acute on chronic combined systolic (congestive) and diastolic (congestive) heart failure: Secondary | ICD-10-CM | POA: Diagnosis not present

## 2022-12-29 DIAGNOSIS — I25810 Atherosclerosis of coronary artery bypass graft(s) without angina pectoris: Secondary | ICD-10-CM | POA: Diagnosis not present

## 2022-12-29 DIAGNOSIS — I739 Peripheral vascular disease, unspecified: Secondary | ICD-10-CM | POA: Diagnosis not present

## 2022-12-29 DIAGNOSIS — E1151 Type 2 diabetes mellitus with diabetic peripheral angiopathy without gangrene: Secondary | ICD-10-CM | POA: Diagnosis not present

## 2022-12-29 DIAGNOSIS — E7800 Pure hypercholesterolemia, unspecified: Secondary | ICD-10-CM | POA: Diagnosis not present

## 2022-12-29 DIAGNOSIS — Z0000 Encounter for general adult medical examination without abnormal findings: Secondary | ICD-10-CM | POA: Diagnosis not present

## 2022-12-29 DIAGNOSIS — Z1339 Encounter for screening examination for other mental health and behavioral disorders: Secondary | ICD-10-CM | POA: Diagnosis not present

## 2022-12-30 ENCOUNTER — Other Ambulatory Visit: Payer: Self-pay | Admitting: *Deleted

## 2022-12-30 DIAGNOSIS — I5022 Chronic systolic (congestive) heart failure: Secondary | ICD-10-CM

## 2022-12-31 ENCOUNTER — Encounter: Payer: Self-pay | Admitting: Podiatry

## 2022-12-31 ENCOUNTER — Ambulatory Visit: Payer: Medicare HMO | Admitting: Podiatry

## 2022-12-31 DIAGNOSIS — M79674 Pain in right toe(s): Secondary | ICD-10-CM

## 2022-12-31 DIAGNOSIS — M79675 Pain in left toe(s): Secondary | ICD-10-CM

## 2022-12-31 DIAGNOSIS — I739 Peripheral vascular disease, unspecified: Secondary | ICD-10-CM

## 2022-12-31 DIAGNOSIS — B351 Tinea unguium: Secondary | ICD-10-CM | POA: Diagnosis not present

## 2022-12-31 DIAGNOSIS — E1159 Type 2 diabetes mellitus with other circulatory complications: Secondary | ICD-10-CM | POA: Diagnosis not present

## 2022-12-31 NOTE — Progress Notes (Signed)
This patient returns to my office for at risk foot care.  This patient requires this care by a professional since this patient will be at risk due to having diabetes.  This patient is unable to cut nails himself since the patient cannot reach his nails.These nails are painful walking and wearing shoes.  This patient presents for at risk foot care today.  General Appearance  Alert, conversant and in no acute stress.  Vascular  Dorsalis pedis and posterior tibial  pulses are  weakly palpable  bilaterally.  Capillary return is within normal limits  bilaterally. Temperature is within normal limits  bilaterally.  Neurologic  Senn-Weinstein monofilament wire test within normal limits  bilaterally. Muscle power within normal limits bilaterally.  Nails Thick disfigured discolored nails with subungual debris  from hallux to fifth toes bilaterally. No evidence of bacterial infection or drainage bilaterally.  Orthopedic  No limitations of motion  feet .  No crepitus or effusions noted.  No bony pathology or digital deformities noted.  Skin  normotropic skin with no porokeratosis noted bilaterally.  No signs of infections or ulcers noted.     Onychomycosis  Pain in right toes  Pain in left toes  Consent was obtained for treatment procedures.   Mechanical debridement of nails 1-5  bilaterally performed with a nail nipper.  Filed with dremel without incident.    Return office visit     3 months                 Told patient to return for periodic foot care and evaluation due to potential at risk complications.   Gardiner Barefoot DPM

## 2022-12-31 NOTE — Addendum Note (Signed)
Addended by: Gardiner Barefoot on: 12/31/2022 10:59 AM   Modules accepted: Level of Service

## 2023-01-02 ENCOUNTER — Encounter (HOSPITAL_COMMUNITY): Payer: Self-pay | Admitting: Internal Medicine

## 2023-01-02 ENCOUNTER — Ambulatory Visit (HOSPITAL_COMMUNITY)
Admission: RE | Admit: 2023-01-02 | Discharge: 2023-01-02 | Disposition: A | Discharge status: Home / Self Care | Payer: Medicare HMO | Source: Ambulatory Visit | Attending: Internal Medicine | Admitting: Internal Medicine

## 2023-01-02 ENCOUNTER — Ambulatory Visit (HOSPITAL_BASED_OUTPATIENT_CLINIC_OR_DEPARTMENT_OTHER)
Admission: RE | Admit: 2023-01-02 | Discharge: 2023-01-02 | Disposition: A | Discharge status: Home / Self Care | Payer: Medicare HMO | Source: Ambulatory Visit

## 2023-01-02 VITALS — BP 140/50 | HR 72 | Wt 177.2 lb

## 2023-01-02 DIAGNOSIS — Z7901 Long term (current) use of anticoagulants: Secondary | ICD-10-CM | POA: Diagnosis not present

## 2023-01-02 DIAGNOSIS — N1831 Chronic kidney disease, stage 3a: Secondary | ICD-10-CM | POA: Insufficient documentation

## 2023-01-02 DIAGNOSIS — Z7984 Long term (current) use of oral hypoglycemic drugs: Secondary | ICD-10-CM | POA: Diagnosis not present

## 2023-01-02 DIAGNOSIS — E785 Hyperlipidemia, unspecified: Secondary | ICD-10-CM | POA: Diagnosis not present

## 2023-01-02 DIAGNOSIS — I5042 Chronic combined systolic (congestive) and diastolic (congestive) heart failure: Secondary | ICD-10-CM | POA: Diagnosis not present

## 2023-01-02 DIAGNOSIS — I6529 Occlusion and stenosis of unspecified carotid artery: Secondary | ICD-10-CM | POA: Diagnosis not present

## 2023-01-02 DIAGNOSIS — E1151 Type 2 diabetes mellitus with diabetic peripheral angiopathy without gangrene: Secondary | ICD-10-CM | POA: Diagnosis not present

## 2023-01-02 DIAGNOSIS — E1122 Type 2 diabetes mellitus with diabetic chronic kidney disease: Secondary | ICD-10-CM | POA: Diagnosis not present

## 2023-01-02 DIAGNOSIS — Z794 Long term (current) use of insulin: Secondary | ICD-10-CM | POA: Insufficient documentation

## 2023-01-02 DIAGNOSIS — Z951 Presence of aortocoronary bypass graft: Secondary | ICD-10-CM | POA: Diagnosis not present

## 2023-01-02 DIAGNOSIS — I2510 Atherosclerotic heart disease of native coronary artery without angina pectoris: Secondary | ICD-10-CM | POA: Insufficient documentation

## 2023-01-02 DIAGNOSIS — I480 Paroxysmal atrial fibrillation: Secondary | ICD-10-CM

## 2023-01-02 DIAGNOSIS — Z79899 Other long term (current) drug therapy: Secondary | ICD-10-CM | POA: Diagnosis not present

## 2023-01-02 DIAGNOSIS — E875 Hyperkalemia: Secondary | ICD-10-CM | POA: Diagnosis not present

## 2023-01-02 DIAGNOSIS — I739 Peripheral vascular disease, unspecified: Secondary | ICD-10-CM

## 2023-01-02 DIAGNOSIS — J449 Chronic obstructive pulmonary disease, unspecified: Secondary | ICD-10-CM | POA: Insufficient documentation

## 2023-01-02 DIAGNOSIS — I5022 Chronic systolic (congestive) heart failure: Secondary | ICD-10-CM | POA: Diagnosis not present

## 2023-01-02 DIAGNOSIS — I130 Hypertensive heart and chronic kidney disease with heart failure and stage 1 through stage 4 chronic kidney disease, or unspecified chronic kidney disease: Secondary | ICD-10-CM | POA: Diagnosis not present

## 2023-01-02 DIAGNOSIS — I083 Combined rheumatic disorders of mitral, aortic and tricuspid valves: Secondary | ICD-10-CM | POA: Insufficient documentation

## 2023-01-02 LAB — ECHOCARDIOGRAM COMPLETE
AR max vel: 1.96 cm2
AV Area VTI: 1.9 cm2
AV Area mean vel: 1.8 cm2
AV Mean grad: 9 mmHg
AV Peak grad: 18 mmHg
Ao pk vel: 2.12 m/s
Area-P 1/2: 6.14 cm2
Calc EF: 41.5 %
MV VTI: 1.77 cm2
P 1/2 time: 467 msec
Single Plane A2C EF: 41.4 %
Single Plane A4C EF: 40.1 %

## 2023-01-02 MED ORDER — PERFLUTREN LIPID MICROSPHERE
1.0000 mL | INTRAVENOUS | Status: DC | PRN
  Administered 2023-01-02: 4 mL via INTRAVENOUS

## 2023-01-02 MED ORDER — AMLODIPINE BESYLATE 10 MG PO TABS: 10.0000 mg | ORAL_TABLET | Freq: Every day | ORAL | 11 refills | Status: DC

## 2023-01-02 NOTE — Progress Notes (Addendum)
ADVANCED HF CLINIC CONSULT NOTE  Primary Care: [name], [name], MD HF Cardiologist: Dr. Haroldine Laws Pulmonary: Dr Verlee Monte  HPI: [name] is a 77 y.o. male with CAD c/p CABG 123456, chronic diastolic CHF, chronic DOE, Rt CEA >75yr ago, Lt CEA 14', HTN, DM2, HLD, PAD w/ Rt SFA and Rt politeal artery vascular stents 15', prostate cancer.    Admitted [id number] a/c systolic heart failure, newly reduced EF. Echo EF 30-35%, mild concentric LVH, GIIDD, RV function mildly reduced, mild MR, mild to mod TR, mild aortic valve regurg. Diuresed with IV lasix. Underwent RHC showing low filling pressures and preserved CO. GDMT started with Farxiga and Lasix. Other GDMT held with AKI. He was discharged home with oxygen, weight 172 lbs.  He was seen in the HF clinic 10/07/22. EKG showed new  aflutter. Started on eliquis and placed on amiodarone. Plan for TEE/DC-CV 10/22/22. Had blood work but it was not processed.   Had blood work with K >7.5.  Sent to ED and admitted on 10/17/22 with hyperkalemia and worsening renal function. Renal [place] for hydronephrosis. Lotrel, faxiga, eplerenone, lasix stopped. Discharged on 10/19/22.   10/22/22 S/P TEE/DC-CV--> EF 30-35% conversion to SR  Echo today 01/02/23 EF 35-40% mild AS/mod AI mild MR  Today he returns for HF follow up with his daughter. Feels good. Denies CP. Says can do anything he wants to do. Goes to store no problem. Cooks, vacuums and mops floors. Compliant with meds.    Cardiac Studies:  - RHC 11/23   RA = 8 RV = 48/10 PA = 45/20 (34) PCW = 13 Fick cardiac output/index = 6.0/3.0 Thermo CO/CI = 4.8/2.4  PVR = 3.3 WU (Fick) Ao sat = 90% PA sat = 55%, 58%   TEE 10/22/22 EF 30-35%    - Echo (11/23): EF 30-35%, mild concentric LVH, GIIDD, RV function mildly reduced, mild MR, mild to mod TR, mild aortic valve regurg  - Echo (9/22): EF 60-65%, normal RV function, LA mildly elevated, trivial MR/TR, mild-mod aortic valve regurg    Past  Medical History:  Diagnosis Date   Arthritis    CAD (coronary artery disease)    Carotid artery occlusion    Diabetes mellitus    fasting 100-120s   Dizziness    Dysrhythmia    skips beats   GERD (gastroesophageal reflux disease)    History of kidney stones    Hyperlipidemia    Hypertension    IHD (ischemic heart disease)    Prior CABG in 2003   Normal nuclear stress test June 2012   [place] [place]    [place]    Torn rotator cuff     Current Outpatient Medications  Medication Sig Dispense Refill   albuterol (VENTOLIN HFA) 108 (90 Base) MCG/ACT inhaler Inhale 2 puffs into the lungs every 6 (six) hours as needed for wheezing or shortness of breath.     amiodarone (PACERONE) 200 MG tablet Take 0.5 tablets (100 mg total) by mouth daily. 15 tablet 11   amLODipine (NORVASC) 5 MG tablet Take 1 tablet (5 mg total) by mouth daily. 30 tablet 11   apixaban (ELIQUIS) 5 MG TABS tablet Take 1 tablet (5 mg total) by mouth 2 (two) times daily. 60 tablet 8   Cholecalciferol (VITAMIN D-3) 125 MCG (5000 UT) TABS Take 5,000 Units by mouth daily.     cyanocobalamin (VITAMIN B12) 1000 MCG tablet Take 1,000 mcg by mouth daily.  dapagliflozin propanediol (FARXIGA) 10 MG TABS tablet Take 10 mg by mouth daily.     ferrous sulfate 325 (65 FE) MG tablet Take 325 mg by mouth daily with breakfast.     furosemide (LASIX) 40 MG tablet Take 40 mg by mouth daily as needed for edema.     hydrALAZINE (APRESOLINE) 50 MG tablet Take 1 tablet (50 mg total) by mouth 3 (three) times daily. 270 tablet 3   insulin NPH Human (NOVOLIN N) 100 UNIT/ML injection Inject 35-40 Units into the skin See admin instructions. Inject 40 units into the skin in the morning and 35 units at night     insulin regular (NOVOLIN R) 100 units/mL injection Inject 6-15 Units into the skin 3 (three) times daily before meals. Per sliding scale     isosorbide mononitrate (IMDUR) 30 MG 24 hr tablet Take 1 tablet (30  mg total) by mouth daily. 90 tablet 3   nitroGLYCERIN (NITROSTAT) 0.4 MG SL tablet Place 1 tablet (0.4 mg total) under the tongue every 5 (five) minutes as needed for chest pain. 25 tablet 6   pantoprazole (PROTONIX) 40 MG tablet Take 40 mg by mouth daily.     rosuvastatin (CRESTOR) 20 MG tablet Take 1 tablet by mouth once daily 90 tablet 3   No current facility-administered medications for this encounter.   Facility-Administered Medications Ordered in Other Encounters  Medication Dose Route Frequency Provider Last Rate Last Admin   perflutren lipid microspheres (DEFINITY) IV suspension  1-10 mL Intravenous PRN [name], Shaune Pascal, MD   4 mL at 01/02/23 1357   Allergies  Allergen Reactions   Aldactone [Spironolactone] Other (See Comments)    Swollen breast   Lipitor [Atorvastatin Calcium] Other (See Comments)    Muscle ache   Sulfa Drugs Cross Reactors Other (See Comments)    "raw spots"   Sulfamethoxazole-Trimethoprim     Other reaction(s): Rash   Codeine Rash   Social History   Socioeconomic History   Marital status: Married    Spouse name: Not on file   Number of children: 2   Years of education: Not on file   Highest education level: Not on file  Occupational History   Not on file  Tobacco Use   Smoking status: Former    Packs/day: 1.00    Years: 20.00    Total pack years: 20.00    Types: Cigarettes    Quit date: 10/27/2001    Years since quitting: 21.1    Passive exposure: Never   Smokeless tobacco: Never  Vaping Use   Vaping Use: Never used  Substance and Sexual Activity   Alcohol use: No    Alcohol/week: 0.0 standard drinks of alcohol   Drug use: No   Sexual activity: Not Currently  Other Topics Concern   Not on file  Social History Narrative   Not on file   Social Determinants of Health   Financial Resource Strain: Not on file  Food Insecurity: No Food Insecurity (09/21/2022)   Hunger Vital Sign    Worried About Running Out of Food in the Last Year:  Never true    Ran Out of Food in the Last Year: Never true  Transportation Needs: No Transportation Needs (09/21/2022)   PRAPARE - Hydrologist (Medical): No    Lack of Transportation (Non-Medical): No  Physical Activity: Not on file  Stress: Not on file  Social Connections: Not on file  Intimate Partner Violence: Not At Risk (09/21/2022)  Humiliation, Afraid, Rape, and Kick questionnaire    Fear of Current or Ex-Partner: No    Emotionally Abused: No    Physically Abused: No    Sexually Abused: No   Family History  Problem Relation Age of Onset   Heart attack Mother    Diabetes Mother    Heart disease Mother    Hypertension Mother    Heart attack Father    Heart disease Father    Hypertension Father    Diabetes Brother    Diabetes Sister    Diabetes Daughter    Heart disease Daughter    Hypertension Daughter    Diabetes Sister    Breast cancer Neg Hx    Prostate cancer Neg Hx    Colon cancer Neg Hx    Pancreatic cancer Neg Hx    BP (!) 140/50   Pulse 72   Wt 80.4 kg (177 lb 3.2 oz)   SpO2 97%   BMI 24.71 kg/m   Wt Readings from Last 3 Encounters:  01/02/23 80.4 kg (177 lb 3.2 oz)  12/16/22 82.4 kg (181 lb 11.2 oz)  12/04/22 82.1 kg (181 lb)   PHYSICAL EXAM: General:  Well appearing. No resp difficulty HEENT: normal Neck: supple. no JVD. Carotids 2+ bilat; no bruits. No lymphadenopathy or thryomegaly appreciated. Cor: PMI nondisplaced. Regular rate & rhythm. No rubs, gallops or murmurs. Lungs: clear Abdomen: soft, nontender, nondistended. No hepatosplenomegaly. No bruits or masses. Good bowel sounds. Extremities: no cyanosis, clubbing, rash, edema Neuro: alert & orientedx3, cranial nerves grossly intact. moves all 4 extremities w/o difficulty. Affect pleasant   ECG : NSR 64 1AVB 281m. IVCD Personally reviewed   ASSESSMENT & PLAN:  Chronic combined systolic and diastolic CHF, previously HFpEF now HFrEF - Echo 9/22: EF  60-65%, normal RV function, LA mildly elevated, trivial MR/TR, mild-mod aortic valve regurg  - Echo (11/23): EF 30-35%, mild concentric LVH, GIIDD, RV function mildly reduced, mild MR, mild to mod TR, mild aortic valve regurg - RHC (11/23) with low filling pressures and preserved cardiac output.  - Echo today 01/02/23 EF 35-40% mild AS/mod AI mild MR - Doing well/ NYHA II. Volume status stable Continue lasix as needed.  - Continue Farxiga 10 mg daily.  - Continue hydral/Imdur - No MRA or Acei/ARNi with hyperkalemia.  - Off BB for now with recent acute exacerbation - Needs sleep study but he declines today. - Initially felt that reduction in EF due to AF but echo today c/w inferior/inferolateral MI. Suspect probable SVG failure. Given CKD and lack of angina will not pursue cath at this time  - Repeat labs and continue to titrate GDMT as tolerated  2. AFL - 10/22/22 TEE/DC-CV ---> SR - Remains in NSR - Cut back amiodarone to 100 mg daily. No ideal with COPD and will eventually place on beta selective BB---> Bisoprolol  - Continue  Eliquis 5 mg bid. - He does not want to pursue sleep study.    3. CAD s/p CABG  - Hx CABG 2003 - No s/s angina - No aspirin and plavix.  On eliquis and crestor.  - Echo c/w inferior/inferolateral MI. Plan as above -> medical rx unless develops angina  4. Mild AS/moderate AI on echo - stable - follow with echo  5. CKD 3a - likely cardiorenal, baseline recently has been 1.4-2.3 - SCr on 12/04/22 1.4 and 12/22/22 SCr 1.8 k 4.8 - Recheck labs today  6. PAD - s/p intervention 2015 Rt SFA and Rt  politeal artery vascular stents - Denies claudication.  7. HTN - Elevated.  - Home BP also running high. SBP 140-160s - Increase amlodipine to 10. If SBP still > 140 in 1 week, in crease hydral to 75 tid  8. DM 2 - Hgb A1c 7.0 - He is on insulin. - Continue Farxiga.  9. COPD - has  new patient appointment scheduled for 12/28 - Recently treated for PNA -  Continue home oxygen  10. Carotid artery stenosis - S/p b/l CEA - Continue statin.  11. Hyperkalemia  - Off MRA/ACEi due to recent hyperkalemia. Will keep off for now.  - he has been using a salt substitute at times. Asked him to stop this.  - repeat labs   Glori Bickers, MD  2:57 PM

## 2023-01-02 NOTE — Patient Instructions (Signed)
INCREASE Amlodipine to 10 mg daily.  ONLY increase Hydralazine to 75 mg Three times a day if your blood pressure stays above 140 (TOP NUMBER) regularly.  Your physician recommends that you schedule a follow-up appointment as scheduled.  If you have any questions or concerns before your next appointment please send [place] a message through [place] or call our office at [phone].    TO LEAVE A MESSAGE FOR THE NURSE SELECT OPTION 2, PLEASE LEAVE A MESSAGE INCLUDING: YOUR NAME DATE OF BIRTH CALL BACK NUMBER REASON FOR CALL**this is important as we prioritize the call backs  YOU WILL RECEIVE A CALL BACK THE SAME DAY AS LONG AS YOU CALL BEFORE 4:00 PM  At the [facility], you and your health needs are our priority. As part of our continuing mission to provide you with exceptional heart care, we have created designated Provider Care Teams. These Care Teams include your primary Cardiologist (physician) and Advanced Practice Providers (APPs- Physician Assistants and Nurse Practitioners) who all work together to provide you with the care you need, when you need it.   You may see any of the following providers on your designated Care Team at your next follow up: Dr Glori Bickers Dr Loralie Champagne Dr. Roxana Hires, NP Lyda Jester, [place] [facility] [place], [place] Forestine Na, NP Audry Riles, PharmD   Please be sure to bring in all your medications bottles to every appointment.    Thank you for choosing [facility]

## 2023-01-14 DIAGNOSIS — I130 Hypertensive heart and chronic kidney disease with heart failure and stage 1 through stage 4 chronic kidney disease, or unspecified chronic kidney disease: Secondary | ICD-10-CM | POA: Diagnosis not present

## 2023-01-14 DIAGNOSIS — M79641 Pain in right hand: Secondary | ICD-10-CM | POA: Diagnosis not present

## 2023-01-14 DIAGNOSIS — M79642 Pain in left hand: Secondary | ICD-10-CM | POA: Diagnosis not present

## 2023-01-14 DIAGNOSIS — J449 Chronic obstructive pulmonary disease, unspecified: Secondary | ICD-10-CM | POA: Diagnosis not present

## 2023-01-14 DIAGNOSIS — M7989 Other specified soft tissue disorders: Secondary | ICD-10-CM | POA: Diagnosis not present

## 2023-01-14 DIAGNOSIS — E1151 Type 2 diabetes mellitus with diabetic peripheral angiopathy without gangrene: Secondary | ICD-10-CM | POA: Diagnosis not present

## 2023-01-14 DIAGNOSIS — M791 Myalgia, unspecified site: Secondary | ICD-10-CM | POA: Diagnosis not present

## 2023-01-15 DIAGNOSIS — Z8546 Personal history of malignant neoplasm of prostate: Secondary | ICD-10-CM | POA: Diagnosis not present

## 2023-01-16 ENCOUNTER — Ambulatory Visit (INDEPENDENT_AMBULATORY_CARE_PROVIDER_SITE_OTHER): Payer: Medicare HMO | Admitting: Student

## 2023-01-16 DIAGNOSIS — R0609 Other forms of dyspnea: Secondary | ICD-10-CM

## 2023-01-16 LAB — PULMONARY FUNCTION TEST
DL/VA % pred: 72 %
DL/VA: 2.86 ml/min/mmHg/L
DLCO cor % pred: 50 %
DLCO cor: 12.76 ml/min/mmHg
DLCO unc % pred: 50 %
DLCO unc: 12.76 ml/min/mmHg
FEF 25-75 Post: 1.84 L/sec
FEF 25-75 Pre: 2.07 L/sec
FEF2575-%Change-Post: -11 %
FEF2575-%Pred-Post: 82 %
FEF2575-%Pred-Pre: 92 %
FEV1-%Change-Post: -2 %
FEV1-%Pred-Post: 70 %
FEV1-%Pred-Pre: 72 %
FEV1-Post: 2.18 L
FEV1-Pre: 2.24 L
FEV1FVC-%Change-Post: -1 %
FEV1FVC-%Pred-Pre: 108 %
FEV6-%Change-Post: -2 %
FEV6-%Pred-Post: 69 %
FEV6-%Pred-Pre: 71 %
FEV6-Post: 2.76 L
FEV6-Pre: 2.82 L
FEV6FVC-%Change-Post: 0 %
FEV6FVC-%Pred-Post: 105 %
FEV6FVC-%Pred-Pre: 106 %
FVC-%Change-Post: -1 %
FVC-%Pred-Post: 65 %
FVC-%Pred-Pre: 66 %
FVC-Post: 2.78 L
FVC-Pre: 2.83 L
Post FEV1/FVC ratio: 78 %
Post FEV6/FVC ratio: 99 %
Pre FEV1/FVC ratio: 79 %
Pre FEV6/FVC Ratio: 100 %
RV % pred: 68 %
RV: 1.74 L
TLC % pred: 65 %
TLC: 4.58 L

## 2023-01-16 NOTE — Patient Instructions (Signed)
Full PFT performed today. °

## 2023-01-16 NOTE — Progress Notes (Signed)
Full PFT performed today. °

## 2023-01-20 ENCOUNTER — Encounter (HOSPITAL_BASED_OUTPATIENT_CLINIC_OR_DEPARTMENT_OTHER): Payer: Self-pay

## 2023-01-20 ENCOUNTER — Emergency Department (HOSPITAL_BASED_OUTPATIENT_CLINIC_OR_DEPARTMENT_OTHER): Payer: Medicare HMO | Admitting: Radiology

## 2023-01-20 ENCOUNTER — Other Ambulatory Visit: Payer: Self-pay

## 2023-01-20 ENCOUNTER — Encounter (HOSPITAL_COMMUNITY): Payer: Self-pay

## 2023-01-20 ENCOUNTER — Emergency Department (HOSPITAL_BASED_OUTPATIENT_CLINIC_OR_DEPARTMENT_OTHER): Payer: Medicare HMO

## 2023-01-20 ENCOUNTER — Inpatient Hospital Stay (HOSPITAL_BASED_OUTPATIENT_CLINIC_OR_DEPARTMENT_OTHER)
Admission: EM | Admit: 2023-01-20 | Discharge: 2023-01-24 | DRG: 291 | Disposition: A | Discharge status: Home / Self Care | Payer: Medicare HMO | Attending: Internal Medicine | Admitting: Internal Medicine

## 2023-01-20 DIAGNOSIS — Z8249 Family history of ischemic heart disease and other diseases of the circulatory system: Secondary | ICD-10-CM

## 2023-01-20 DIAGNOSIS — Z9981 Dependence on supplemental oxygen: Secondary | ICD-10-CM | POA: Diagnosis not present

## 2023-01-20 DIAGNOSIS — Z794 Long term (current) use of insulin: Secondary | ICD-10-CM | POA: Diagnosis not present

## 2023-01-20 DIAGNOSIS — N189 Chronic kidney disease, unspecified: Secondary | ICD-10-CM | POA: Diagnosis present

## 2023-01-20 DIAGNOSIS — J439 Emphysema, unspecified: Secondary | ICD-10-CM | POA: Diagnosis not present

## 2023-01-20 DIAGNOSIS — I152 Hypertension secondary to endocrine disorders: Secondary | ICD-10-CM | POA: Diagnosis present

## 2023-01-20 DIAGNOSIS — I440 Atrioventricular block, first degree: Secondary | ICD-10-CM | POA: Diagnosis present

## 2023-01-20 DIAGNOSIS — Z951 Presence of aortocoronary bypass graft: Secondary | ICD-10-CM | POA: Diagnosis not present

## 2023-01-20 DIAGNOSIS — R188 Other ascites: Secondary | ICD-10-CM | POA: Diagnosis not present

## 2023-01-20 DIAGNOSIS — I4892 Unspecified atrial flutter: Secondary | ICD-10-CM | POA: Diagnosis present

## 2023-01-20 DIAGNOSIS — Z79899 Other long term (current) drug therapy: Secondary | ICD-10-CM

## 2023-01-20 DIAGNOSIS — I5043 Acute on chronic combined systolic (congestive) and diastolic (congestive) heart failure: Secondary | ICD-10-CM | POA: Diagnosis not present

## 2023-01-20 DIAGNOSIS — N1832 Chronic kidney disease, stage 3b: Secondary | ICD-10-CM | POA: Diagnosis present

## 2023-01-20 DIAGNOSIS — E1151 Type 2 diabetes mellitus with diabetic peripheral angiopathy without gangrene: Secondary | ICD-10-CM | POA: Diagnosis not present

## 2023-01-20 DIAGNOSIS — E1165 Type 2 diabetes mellitus with hyperglycemia: Secondary | ICD-10-CM | POA: Diagnosis not present

## 2023-01-20 DIAGNOSIS — I5041 Acute combined systolic (congestive) and diastolic (congestive) heart failure: Secondary | ICD-10-CM | POA: Diagnosis not present

## 2023-01-20 DIAGNOSIS — E1122 Type 2 diabetes mellitus with diabetic chronic kidney disease: Secondary | ICD-10-CM | POA: Diagnosis present

## 2023-01-20 DIAGNOSIS — I5023 Acute on chronic systolic (congestive) heart failure: Secondary | ICD-10-CM | POA: Diagnosis not present

## 2023-01-20 DIAGNOSIS — Z87891 Personal history of nicotine dependence: Secondary | ICD-10-CM | POA: Diagnosis not present

## 2023-01-20 DIAGNOSIS — J9601 Acute respiratory failure with hypoxia: Secondary | ICD-10-CM

## 2023-01-20 DIAGNOSIS — E785 Hyperlipidemia, unspecified: Secondary | ICD-10-CM | POA: Diagnosis not present

## 2023-01-20 DIAGNOSIS — R0602 Shortness of breath: Secondary | ICD-10-CM | POA: Diagnosis not present

## 2023-01-20 DIAGNOSIS — Z8546 Personal history of malignant neoplasm of prostate: Secondary | ICD-10-CM | POA: Diagnosis not present

## 2023-01-20 DIAGNOSIS — I480 Paroxysmal atrial fibrillation: Secondary | ICD-10-CM | POA: Diagnosis not present

## 2023-01-20 DIAGNOSIS — I130 Hypertensive heart and chronic kidney disease with heart failure and stage 1 through stage 4 chronic kidney disease, or unspecified chronic kidney disease: Secondary | ICD-10-CM | POA: Diagnosis not present

## 2023-01-20 DIAGNOSIS — J432 Centrilobular emphysema: Secondary | ICD-10-CM | POA: Diagnosis not present

## 2023-01-20 DIAGNOSIS — Z885 Allergy status to narcotic agent status: Secondary | ICD-10-CM

## 2023-01-20 DIAGNOSIS — R0603 Acute respiratory distress: Secondary | ICD-10-CM

## 2023-01-20 DIAGNOSIS — N179 Acute kidney failure, unspecified: Secondary | ICD-10-CM | POA: Diagnosis present

## 2023-01-20 DIAGNOSIS — I255 Ischemic cardiomyopathy: Secondary | ICD-10-CM | POA: Diagnosis present

## 2023-01-20 DIAGNOSIS — E1159 Type 2 diabetes mellitus with other circulatory complications: Secondary | ICD-10-CM | POA: Diagnosis present

## 2023-01-20 DIAGNOSIS — I4510 Unspecified right bundle-branch block: Secondary | ICD-10-CM | POA: Diagnosis present

## 2023-01-20 DIAGNOSIS — K219 Gastro-esophageal reflux disease without esophagitis: Secondary | ICD-10-CM | POA: Diagnosis not present

## 2023-01-20 DIAGNOSIS — J90 Pleural effusion, not elsewhere classified: Secondary | ICD-10-CM | POA: Diagnosis not present

## 2023-01-20 DIAGNOSIS — J810 Acute pulmonary edema: Secondary | ICD-10-CM | POA: Diagnosis not present

## 2023-01-20 DIAGNOSIS — E1169 Type 2 diabetes mellitus with other specified complication: Secondary | ICD-10-CM | POA: Diagnosis present

## 2023-01-20 DIAGNOSIS — I10 Essential (primary) hypertension: Secondary | ICD-10-CM | POA: Diagnosis not present

## 2023-01-20 DIAGNOSIS — J9621 Acute and chronic respiratory failure with hypoxia: Secondary | ICD-10-CM | POA: Diagnosis present

## 2023-01-20 DIAGNOSIS — I2510 Atherosclerotic heart disease of native coronary artery without angina pectoris: Secondary | ICD-10-CM | POA: Diagnosis not present

## 2023-01-20 DIAGNOSIS — Z7901 Long term (current) use of anticoagulants: Secondary | ICD-10-CM

## 2023-01-20 DIAGNOSIS — I110 Hypertensive heart disease with heart failure: Secondary | ICD-10-CM | POA: Diagnosis not present

## 2023-01-20 DIAGNOSIS — Z833 Family history of diabetes mellitus: Secondary | ICD-10-CM

## 2023-01-20 DIAGNOSIS — J9811 Atelectasis: Secondary | ICD-10-CM | POA: Diagnosis not present

## 2023-01-20 DIAGNOSIS — J449 Chronic obstructive pulmonary disease, unspecified: Secondary | ICD-10-CM | POA: Diagnosis present

## 2023-01-20 HISTORY — DX: Acute respiratory failure with hypoxia: J96.01

## 2023-01-20 LAB — I-STAT VENOUS BLOOD GAS, ED
Acid-base deficit: 8 mmol/L — ABNORMAL HIGH (ref 0.0–2.0)
Bicarbonate: 15.3 mmol/L — ABNORMAL LOW (ref 20.0–28.0)
Calcium, Ion: 1.18 mmol/L (ref 1.15–1.40)
HCT: 34 % — ABNORMAL LOW (ref 39.0–52.0)
Hemoglobin: 11.6 g/dL — ABNORMAL LOW (ref 13.0–17.0)
O2 Saturation: 83 %
Potassium: 3.3 mmol/L — ABNORMAL LOW (ref 3.5–5.1)
Sodium: 139 mmol/L (ref 135–145)
TCO2: 16 mmol/L — ABNORMAL LOW (ref 22–32)
pCO2, Ven: 24.4 mmHg — ABNORMAL LOW (ref 44–60)
pH, Ven: 7.405 (ref 7.25–7.43)
pO2, Ven: 46 mmHg — ABNORMAL HIGH (ref 32–45)

## 2023-01-20 LAB — CBC WITH DIFFERENTIAL/PLATELET
Abs Immature Granulocytes: 0.03 10*3/uL (ref 0.00–0.07)
Basophils Absolute: 0 10*3/uL (ref 0.0–0.1)
Basophils Relative: 0 %
Eosinophils Absolute: 0.1 10*3/uL (ref 0.0–0.5)
Eosinophils Relative: 1 %
HCT: 44.1 % (ref 39.0–52.0)
Hemoglobin: 13.4 g/dL (ref 13.0–17.0)
Immature Granulocytes: 0 %
Lymphocytes Relative: 10 %
Lymphs Abs: 1 10*3/uL (ref 0.7–4.0)
MCH: 26.7 pg (ref 26.0–34.0)
MCHC: 30.4 g/dL (ref 30.0–36.0)
MCV: 87.8 fL (ref 80.0–100.0)
Monocytes Absolute: 0.7 10*3/uL (ref 0.1–1.0)
Monocytes Relative: 7 %
Neutro Abs: 7.8 10*3/uL — ABNORMAL HIGH (ref 1.7–7.7)
Neutrophils Relative %: 82 %
Platelets: 252 10*3/uL (ref 150–400)
RBC: 5.02 MIL/uL (ref 4.22–5.81)
RDW: 21 % — ABNORMAL HIGH (ref 11.5–15.5)
WBC: 9.6 10*3/uL (ref 4.0–10.5)
nRBC: 0 % (ref 0.0–0.2)

## 2023-01-20 LAB — BRAIN NATRIURETIC PEPTIDE: B Natriuretic Peptide: 1939.2 pg/mL — ABNORMAL HIGH (ref 0.0–100.0)

## 2023-01-20 LAB — BASIC METABOLIC PANEL
Anion gap: 12 (ref 5–15)
BUN: 52 mg/dL — ABNORMAL HIGH (ref 8–23)
CO2: 18 mmol/L — ABNORMAL LOW (ref 22–32)
Calcium: 9.8 mg/dL (ref 8.9–10.3)
Chloride: 107 mmol/L (ref 98–111)
Creatinine, Ser: 1.98 mg/dL — ABNORMAL HIGH (ref 0.61–1.24)
GFR, Estimated: 35 mL/min — ABNORMAL LOW (ref >60)
Glucose, Bld: 162 mg/dL — ABNORMAL HIGH (ref 70–99)
Potassium: 3.6 mmol/L (ref 3.5–5.1)
Sodium: 137 mmol/L (ref 135–145)

## 2023-01-20 LAB — TROPONIN I (HIGH SENSITIVITY)
Troponin I (High Sensitivity): 714 ng/L (ref <18)
Troponin I (High Sensitivity): 547 ng/L (ref <18)

## 2023-01-20 MED ORDER — IPRATROPIUM-ALBUTEROL 0.5-2.5 (3) MG/3ML IN SOLN
3.0000 mL | Freq: Once | RESPIRATORY_TRACT | Status: AC
  Administered 2023-01-20: 3 mL via RESPIRATORY_TRACT
  Filled 2023-01-20: qty 3

## 2023-01-20 MED ORDER — ALBUTEROL SULFATE HFA 108 (90 BASE) MCG/ACT IN AERS: 2.0000 | INHALATION_SPRAY | RESPIRATORY_TRACT | Status: DC | PRN

## 2023-01-20 MED ORDER — METHYLPREDNISOLONE SODIUM SUCC 125 MG IJ SOLR
62.5000 mg | Freq: Once | INTRAMUSCULAR | Status: AC
  Administered 2023-01-20: 62.5 mg via INTRAVENOUS
  Filled 2023-01-20: qty 2

## 2023-01-20 MED ORDER — ASPIRIN 81 MG PO CHEW
324.0000 mg | CHEWABLE_TABLET | Freq: Once | ORAL | Status: AC
  Administered 2023-01-20: 324 mg via ORAL
  Filled 2023-01-20: qty 4

## 2023-01-20 MED ORDER — IOHEXOL 350 MG/ML SOLN
100.0000 mL | Freq: Once | INTRAVENOUS | Status: AC | PRN
  Administered 2023-01-20: 60 mL via INTRAVENOUS

## 2023-01-20 MED ORDER — FUROSEMIDE 10 MG/ML IJ SOLN
40.0000 mg | Freq: Once | INTRAMUSCULAR | Status: AC
  Administered 2023-01-20: 40 mg via INTRAVENOUS
  Filled 2023-01-20: qty 4

## 2023-01-20 NOTE — Progress Notes (Addendum)
Patient's oxygen saturation was decreasing on the 3L [place] to 87%. RT encouraged patient to try the [facility] again to relieve the WOB. Patient agreed. He is currently on 50L / 38% and tolerating at this time with less WOB . Saturation increased to 95%

## 2023-01-20 NOTE — Progress Notes (Signed)
VBG collected while patient was on 3L [place]

## 2023-01-20 NOTE — ED Provider Notes (Signed)
**Connor Connor** Connor Connor  CSN: [id number] Arrival date & time: 01/20/23 1342  Chief Complaint(s) No chief complaint on file.  HPI Connor Connor is a 77 y.o. male with past medical history as below, significant for CAD, CKD, combined heart failure Echo 3/24, EF 35-40%, follows w/ Dr Connor Connor, COPD, HTN who presents to the ED with complaint of dyspnea.  Dyspnea acutely worsened this morning around 4 to 5 AM.  He uses 2 to 3 L oxygen as needed, has not had uses 3 L oxygen since this morning.  No chest pain.  He has orthopnea, exertional dyspnea.  Difficulty ambulating secondary to dyspnea and becomes fatigued very quickly.  Reports he had pulmonary function test done a week ago and since then his breathing has did been deteriorating.  No fevers or chills, no nausea or vomiting, no significant chest pain.  Reports worsening swelling to his lower extremities, feet.  Compliant with Lasix.  Compliant with home medications otherwise.  Past Medical History Past Medical History:  Diagnosis Date   Arthritis    CAD (coronary artery disease)    Carotid artery occlusion    Diabetes mellitus    fasting 100-120s   Dizziness    Dysrhythmia    skips beats   GERD (gastroesophageal reflux disease)    History of kidney stones    Hyperlipidemia    Hypertension    IHD (ischemic heart disease)    Prior CABG in 2003   Normal nuclear stress test June 2012   Peripheral vascular disease ([place])    Prostate cancer ([place])    Torn rotator cuff    Patient Active Problem List   Diagnosis Date Noted   Pain due to onychomycosis of toenails of both feet 12/31/2022   Diabetic vasculopathy (Connor Connor) 12/31/2022   Acute kidney injury superimposed on chronic kidney disease ([name]) 10/19/2022   Hyperkalemia 10/17/2022   Acute renal failure superimposed on stage 3a chronic kidney disease (Connor Connor) 10/17/2022   Chronic combined systolic and diastolic CHF (congestive heart failure) (Connor Connor)  10/17/2022   Atrial flutter (Connor Connor) 10/17/2022   COPD (chronic obstructive pulmonary disease) ([place]) 10/17/2022   Acute on chronic diastolic (congestive) heart failure (Connor Connor) 09/20/2022   Acute CHF (congestive heart failure) (Connor Connor) 11/30/2021   Acute on chronic diastolic CHF (congestive heart failure) (Connor Connor) 07/08/2021   Malignant neoplasm of prostate (Connor Connor) 11/29/2019   Chronic multifocal osteomyelitis, right ankle and foot (Connor Connor) 03/23/2018   Diabetic ulcer of toe of right foot associated with type 2 diabetes mellitus, with fat layer exposed ([name]) 03/05/2018   Cellulitis of extremity 11/19/2017   GERD (gastroesophageal reflux disease) 02/05/2016   Benign essential hypertension 01/28/2016   Insulin dependent type 2 diabetes mellitus (Connor Connor) 01/28/2016   Ischemic heart disease 05/19/2014   Peripheral vascular disease, unspecified (Connor Connor) 01/12/2014   Atherosclerosis of native arteries of the extremities with ulceration(440.23) 12/08/2013   Wound drainage 07/25/2013   Carotid artery stenosis 07/15/2013   Right leg claudication ([place]) 07/08/2013   Right carotid bruit 07/08/2013   CAD (coronary artery disease) 06/19/2011   Hypertension associated with diabetes (Connor Connor) 06/19/2011   Hyperlipidemia associated with type 2 diabetes mellitus (Connor Connor) 06/19/2011   Atherosclerotic heart disease of native coronary artery without angina pectoris 06/19/2011   Home Medication(s) Prior to Admission medications   Medication Sig Start Date End Date Taking? Authorizing Provider  albuterol (VENTOLIN HFA) 108 (90 Base) MCG/ACT inhaler Inhale 2 puffs into the lungs every 6 (six) hours as needed  for wheezing or shortness of breath. 07/02/21  Yes [provider]  amiodarone (PACERONE) 200 MG tablet Take 0.5 tablets (100 mg total) by mouth daily. 11/07/22  Yes Clegg, [name] D, NP  amLODipine (NORVASC) 10 MG tablet Take 1 tablet (10 mg total) by mouth daily. 01/02/23  Yes Bensimhon, Shaune Pascal, MD  apixaban (ELIQUIS) 5 MG TABS  tablet Take 1 tablet (5 mg total) by mouth 2 (two) times daily. 10/07/22  Yes Milford, Maricela Bo, FNP  Cholecalciferol (VITAMIN D-3) 125 MCG (5000 UT) TABS Take 5,000 Units by mouth daily.   Yes [provider]  cyanocobalamin (VITAMIN B12) 1000 MCG tablet Take 1,000 mcg by mouth daily.   Yes [provider]  dapagliflozin propanediol (FARXIGA) 10 MG TABS tablet Take 10 mg by mouth daily.   Yes [provider]  ferrous sulfate 325 (65 FE) MG tablet Take 325 mg by mouth daily with breakfast.   Yes [provider]  furosemide (LASIX) 40 MG tablet Take 40 mg by mouth daily as needed for edema.   Yes [provider]  hydrALAZINE (APRESOLINE) 50 MG tablet Take 1 tablet (50 mg total) by mouth 3 (three) times daily. 12/04/22  Yes Bensimhon, Shaune Pascal, MD  insulin NPH Human (NOVOLIN N) 100 UNIT/ML injection Inject 35-40 Units into the skin See admin instructions. Inject 40 units into the skin in the morning and 35 units at night 09/08/19  Yes [provider]  insulin regular (NOVOLIN R) 100 units/mL injection Inject 6-15 Units into the skin 3 (three) times daily before meals. Per sliding scale 09/08/19  Yes [provider]  isosorbide mononitrate (IMDUR) 30 MG 24 hr tablet Take 1 tablet (30 mg total) by mouth daily. 12/04/22  Yes Bensimhon, Shaune Pascal, MD  pantoprazole (PROTONIX) 40 MG tablet Take 40 mg by mouth daily.   Yes [provider]  rosuvastatin (CRESTOR) 20 MG tablet Take 1 tablet by mouth once daily 08/18/22  Yes Nahser, Wonda Cheng, MD  nitroGLYCERIN (NITROSTAT) 0.4 MG SL tablet Place 1 tablet (0.4 mg total) under the tongue every 5 (five) minutes as needed for chest pain. 08/31/20   Nahser, Wonda Cheng, MD                                                                                                                                    Past Surgical History Past Surgical History:  Procedure Laterality Date   ABDOMINAL AORTAGRAM N/A  12/21/2013   Procedure: ABDOMINAL Maxcine Ham;  Surgeon: Elam Dutch, MD;  Location[facility] CATH LAB;  Service: Cardiovascular;  Laterality: N/A;   ABDOMINAL AORTOGRAM N/A 11/13/2017   Procedure: ABDOMINAL AORTOGRAM;  Surgeon: Elam Dutch, MD;  Location: [facility] CV LAB;  Service: Cardiovascular;  Laterality: N/A;   APPENDECTOMY     ARCH AORTOGRAM  08/26/2013   Procedure: ARCH AORTOGRAM;  Surgeon: Elam Dutch, MD;  Location: [facility] CATH LAB;  Service: Cardiovascular;;   CARDIAC CATHETERIZATION  05/03/2002   EF 40-45%   CARDIOVASCULAR STRESS TEST  08/10/2006   EF 65%   CARDIOVERSION N/A 10/22/2022   Procedure: CARDIOVERSION;  Surgeon: Jolaine Artist, MD;  Location: [facility];  Service: Cardiovascular;  Laterality: N/A;   CAROTID ENDARTERECTOMY     CAROTID STENT INSERTION Left 08/26/2013   Procedure: CAROTID STENT INSERTION;  Surgeon: Elam Dutch, MD;  Location: [facility] CATH LAB;  Service: Cardiovascular;  Laterality: Left;  internal carotid   CORONARY ARTERY BYPASS GRAFT  04/2002   DOPPLER ECHOCARDIOGRAPHY  08/12/2002   EF 80-85%   ENDARTERECTOMY Left 07/20/2013   Procedure: ATTEMPTED ENDARTERECTOMY CAROTID;  Surgeon: Conrad Castleberry, MD;  Location: [facility];  Service: Vascular;  Laterality: Left;   HERNIA REPAIR     LOWER EXTREMITY ANGIOGRAPHY  11/13/2017   Procedure: Lower Extremity Angiography;  Surgeon: Elam Dutch, MD;  Location: [facility] CV LAB;  Service: Cardiovascular;;   PERIPHERAL VASCULAR INTERVENTION Right 11/13/2017   Procedure: PERIPHERAL VASCULAR INTERVENTION;  Surgeon: Elam Dutch, MD;  Location: [place] CV LAB;  Service: Cardiovascular;  Laterality: Right;  superficial femoral   RIGHT HEART CATH N/A 09/23/2022   Procedure: RIGHT HEART CATH;  Surgeon: Jolaine Artist, MD;  Location: [place] CV LAB;  Service: Cardiovascular;  Laterality: N/A;   SHOULDER SURGERY     TEE WITHOUT CARDIOVERSION N/A 10/22/2022   Procedure: TRANSESOPHAGEAL  ECHOCARDIOGRAM (TEE);  Surgeon: Jolaine Artist, MD;  Location: [facility] ENDOSCOPY;  Service: Cardiovascular;  Laterality: N/A;   Family History Family History  Problem Relation Age of Onset   Heart attack Mother    Diabetes Mother    Heart disease Mother    Hypertension Mother    Heart attack Father    Heart disease Father    Hypertension Father    Diabetes Brother    Diabetes Sister    Diabetes Daughter    Heart disease Daughter    Hypertension Daughter    Diabetes Sister    Breast cancer Neg Hx    Prostate cancer Neg Hx    Colon cancer Neg Hx    Pancreatic cancer Neg Hx     Social History Social History   Tobacco Use   Smoking status: Former    Packs/day: 1.00    Years: 20.00    Additional pack years: 0.00    Total pack years: 20.00    Types: Cigarettes    Quit date: 10/27/2001    Years since quitting: 21.2    Passive exposure: Never   Smokeless tobacco: Never  Vaping Use   Vaping Use: Never used  Substance Use Topics   Alcohol use: No    Alcohol/week: 0.0 standard drinks of alcohol   Drug use: No   Allergies Aldactone [spironolactone], Lipitor [atorvastatin calcium], Sulfa drugs cross reactors, Sulfamethoxazole-trimethoprim, and Codeine  Review of Systems Review of Systems  Constitutional:  Positive for fatigue. Negative for chills and fever.  HENT:  Negative for facial swelling and trouble swallowing.   Eyes:  Negative for photophobia and visual disturbance.  Respiratory:  Positive for chest tightness and shortness of breath. Negative for cough.   Cardiovascular:  Positive for leg swelling. Negative for chest pain and palpitations.  Gastrointestinal:  Negative for abdominal pain, nausea and vomiting.  Endocrine: Negative for polydipsia and polyuria.  Genitourinary:  Negative for difficulty urinating and hematuria.  Musculoskeletal:  Negative for gait problem and joint swelling.  Skin:  Negative for  pallor and rash.  Neurological:  Negative for syncope and  headaches.  Psychiatric/Behavioral:  Negative for agitation and confusion.     Physical Exam Vital Signs  I have reviewed the triage vital signs BP 134/79 (BP Location: Left Arm)   Pulse 78   Temp (!) 97.1 F (36.2 C)   Resp (!) 21   SpO2 96%  Physical Exam Vitals and nursing Connor reviewed.  Constitutional:      General: He is not in acute distress.    Appearance: He is well-developed. He is not toxic-appearing.  HENT:     Head: Normocephalic and atraumatic.     Right Ear: External ear normal.     Left Ear: External ear normal.     Mouth/Throat:     Mouth: Mucous membranes are moist.  Eyes:     General: No scleral icterus. Cardiovascular:     Rate and Rhythm: Normal rate and regular rhythm.     Pulses: Normal pulses.     Heart sounds: Normal heart sounds.  Pulmonary:     Effort: Pulmonary effort is normal. Tachypnea present. No respiratory distress.     Breath sounds: Decreased breath sounds present.     Comments: Pursed lip breathing  Abdominal:     General: Abdomen is flat.     Palpations: Abdomen is soft.     Tenderness: There is no abdominal tenderness.  Musculoskeletal:     Right lower leg: Edema (pedal 1+) present.     Left lower leg: Edema (pedal 1+) present.  Skin:    General: Skin is warm and dry.     Capillary Refill: Capillary refill takes less than 2 seconds.  Neurological:     Mental Status: He is alert and oriented to person, place, and time.  Psychiatric:        Mood and Affect: Mood normal.        Behavior: Behavior normal.     ED Results and Treatments Labs (all labs ordered are listed, but only abnormal results are displayed) Labs Reviewed  BASIC METABOLIC PANEL - Abnormal; Notable for the following components:      Result Value   CO2 18 (*)    Glucose, Bld 162 (*)    BUN 52 (*)    Creatinine, Ser 1.98 (*)    GFR, Estimated 35 (*)    All other components within normal limits  CBC WITH DIFFERENTIAL/PLATELET - Abnormal; Notable for the  following components:   RDW 21.0 (*)    Neutro Abs 7.8 (*)    All other components within normal limits  BRAIN NATRIURETIC PEPTIDE - Abnormal; Notable for the following components:   B Natriuretic Peptide 1,939.2 (*)    All other components within normal limits  I-STAT VENOUS BLOOD GAS, ED - Abnormal; Notable for the following components:   pCO2, Ven 24.4 (*)    pO2, Ven 46 (*)    Bicarbonate 15.3 (*)    TCO2 16 (*)    Acid-base deficit 8.0 (*)    Potassium 3.3 (*)    HCT 34.0 (*)    Hemoglobin 11.6 (*)    All other components within normal limits  TROPONIN I (HIGH SENSITIVITY) - Abnormal; Notable for the following components:   Troponin I (High Sensitivity) 714 (*)    All other components within normal limits  TROPONIN I (HIGH SENSITIVITY)  Radiology DG Chest Port 1 View  Result Date: 01/20/2023 CLINICAL DATA:  Shortness of breath EXAM: PORTABLE CHEST 1 VIEW COMPARISON:  Chest x-ray dated September 18, 2022 FINDINGS: Cardiac and mediastinal contours are unchanged status post median sternotomy. Mild diffuse interstitial opacities, similar to prior. Trace bilateral pleural effusions. No evidence of pneumothorax. IMPRESSION: 1. Mild diffuse interstitial opacities, likely due to pulmonary edema. 2. Trace bilateral pleural effusions. Electronically Signed   By: Yetta Glassman M.D.   On: 01/20/2023 14:28    Pertinent labs & imaging results that were available during my care of the patient were reviewed by me and considered in my medical decision making (see MDM for details).  Medications Ordered in ED Medications  albuterol (VENTOLIN HFA) 108 (90 Base) MCG/ACT inhaler 2 puff (has no administration in time range)  ipratropium-albuterol (DUONEB) 0.5-2.5 (3) MG/3ML nebulizer solution 3 mL (3 mLs Nebulization Given 01/20/23 1424)  methylPREDNISolone sodium succinate  (SOLU-MEDROL) 125 mg/2 mL injection 62.5 mg (62.5 mg Intravenous Given 01/20/23 1453)  furosemide (LASIX) injection 40 mg (40 mg Intravenous Given 01/20/23 1447)  aspirin chewable tablet 324 mg (324 mg Oral Given 01/20/23 1523)                                                                                                                                     Procedures .Critical Care  Performed by: Jeanell Sparrow, DO Authorized by: Jeanell Sparrow, DO   Critical care provider statement:    Critical care time (minutes):  44   Critical care time was exclusive of:  Separately billable procedures and treating other patients   Critical care was necessary to treat or prevent imminent or life-threatening deterioration of the following conditions:  Respiratory failure and cardiac failure   Critical care was time spent personally by me on the following activities:  Development of treatment plan with patient or surrogate, discussions with consultants, evaluation of patient's response to treatment, examination of patient, ordering and review of laboratory studies, ordering and review of radiographic studies, ordering and performing treatments and interventions, pulse oximetry, re-evaluation of patient's condition, review of old charts and obtaining history from patient or surrogate   (including critical care time)  Medical Decision Making / ED Course    Medical Decision Making:    Connor Connor is a 77 y.o. male with past medical history as below, significant for CAD, CKD, combined heart failure Echo 3/24, EF 35-40%, follows w/ Dr Connor Connor, COPD, HTN who presents to the ED with complaint of dyspnea.  Dyspnea acutely worsened this morning around 4 to 5 AM. . The complaint involves an extensive differential diagnosis and also carries with it a high risk of complications and morbidity.  Serious etiology was considered. Ddx includes but is not limited to: In my evaluation of this patient's dyspnea my DDx  includes, but is not limited to, pneumonia, pulmonary embolism, pneumothorax, pulmonary edema, metabolic acidosis,  asthma, COPD, cardiac cause, anemia, anxiety, etc.    Complete initial physical exam performed, notably the patient  was tachypnea noted, no tachycardia.  He is not hypoxic.  On 3 L nasal cannula.  Diminished breath sounds bilateral.    Reviewed and confirmed nursing documentation for past medical history, family history, social history.  Vital signs reviewed.    Clinical Course as of 01/20/23 1531  Tue Jan 20, 2023  1431 Tachypnea, diminished breath sounds bilaterally, given DuoNebs, Solu-Medrol.  Will obtain screening labs, chest x-ray [SG]  1431 Bedside wet read of x-ray concern for pulmonary edema.  Confirmed by radiology [SG]  1455 Patient with increased work of breathing on 3 L nasal cannula, he adamantly refuses BiPAP. [SG]  1457 B Natriuretic Peptide(!): 1,939.2 Pulmonary edema on chest x-ray, lower extremity edema increased work of breathing.  Concern for acute CHF exacerbation.  hx HFrEF.  Give Lasix.  Recommend admission. [SG]  1517 Increased WOB, does not want BIPAP, will trial HFNC  [SG]  1528 WOB improved with HFNC 50L [SG]  1529 Troponin I (High Sensitivity)(!!): 714 No chest pain currently, EKG w/o stemi. Give ASA, likely demand ischemia  [SG]  1531 Creatinine(!): 1.98 worse from prior, baseline around 1.5. [SG]    Clinical Course User Index [SG] Wynona Dove A, DO   Patient given Lasix, aspirin, nebulized breathing treatment and steroids.  Placed on high flow nasal cannula improvement respiratory status.  He is pending CT PE at time of shift change.  Patient will require admission for acute CHF exacerbation, pulm edema, respiratory distress.  Pending CT results.  Signed out to incoming EDP pending CT and eventual admission.       Additional history obtained: -Additional history obtained from family -External records from outside source obtained and  reviewed including: Chart review including previous notes, labs, imaging, consultation notes including care documentation, home medications, prior labs and imaging, prior admission, prior echo   Lab Tests: -I ordered, reviewed, and interpreted labs.   The pertinent results include:   Labs Reviewed  BASIC METABOLIC PANEL - Abnormal; Notable for the following components:      Result Value   CO2 18 (*)    Glucose, Bld 162 (*)    BUN 52 (*)    Creatinine, Ser 1.98 (*)    GFR, Estimated 35 (*)    All other components within normal limits  CBC WITH DIFFERENTIAL/PLATELET - Abnormal; Notable for the following components:   RDW 21.0 (*)    Neutro Abs 7.8 (*)    All other components within normal limits  BRAIN NATRIURETIC PEPTIDE - Abnormal; Notable for the following components:   B Natriuretic Peptide 1,939.2 (*)    All other components within normal limits  I-STAT VENOUS BLOOD GAS, ED - Abnormal; Notable for the following components:   pCO2, Ven 24.4 (*)    pO2, Ven 46 (*)    Bicarbonate 15.3 (*)    TCO2 16 (*)    Acid-base deficit 8.0 (*)    Potassium 3.3 (*)    HCT 34.0 (*)    Hemoglobin 11.6 (*)    All other components within normal limits  TROPONIN I (HIGH SENSITIVITY) - Abnormal; Notable for the following components:   Troponin I (High Sensitivity) 714 (*)    All other components within normal limits  TROPONIN I (HIGH SENSITIVITY)    Notable for as above  EKG   EKG Interpretation  Date/Time:  Tuesday January 20 2023 13:56:01 EDT Ventricular Rate:  87 PR Interval:  240 QRS Duration: 152 QT Interval:  426 QTC Calculation: 512 R Axis:   159 Text Interpretation: Sinus rhythm with 1st degree A-V block Right bundle branch block Anteroseptal infarct (cited on or before 02-Jan-2023) T wave abnormality, consider inferolateral ischemia Abnormal ECG When compared with ECG of 02-Jan-2023 15:16, Right bundle branch block is now Present Confirmed by Wynona Dove ([id number]) on 01/20/2023  3:09:01 PM         Imaging Studies ordered: I ordered imaging studies including CXR CTPE I independently visualized the following imaging with scope of interpretation limited to determining acute life threatening conditions related to emergency care; findings noted above, significant for Cxr with pulm edema, ctpe pending I independently visualized and interpreted imaging. I agree with the radiologist interpretation   Medicines ordered and prescription drug management: Meds ordered this encounter  Medications   albuterol (VENTOLIN HFA) 108 (90 Base) MCG/ACT inhaler 2 puff   ipratropium-albuterol (DUONEB) 0.5-2.5 (3) MG/3ML nebulizer solution 3 mL   methylPREDNISolone sodium succinate (SOLU-MEDROL) 125 mg/2 mL injection 62.5 mg   furosemide (LASIX) injection 40 mg   aspirin chewable tablet 324 mg    -I have reviewed the patients home medicines and have made adjustments as needed   Consultations Obtained: na   Cardiac Monitoring: The patient was maintained on a cardiac monitor.  I personally viewed and interpreted the cardiac monitored which showed an underlying rhythm of: SR  Social Determinants of Health:  Diagnosis or treatment significantly limited by social determinants of health: former smoker   Reevaluation: After the interventions noted above, I reevaluated the patient and found that they have improved  Co morbidities that complicate the patient evaluation  Past Medical History:  Diagnosis Date   Arthritis    CAD (coronary artery disease)    Carotid artery occlusion    Diabetes mellitus    fasting 100-120s   Dizziness    Dysrhythmia    skips beats   GERD (gastroesophageal reflux disease)    History of kidney stones    Hyperlipidemia    Hypertension    IHD (ischemic heart disease)    Prior CABG in 2003   Normal nuclear stress test June 2012   Peripheral vascular disease ([place])    Prostate cancer ([place])    Torn rotator cuff        Dispostion: Disposition decision including need for hospitalization was considered, and patient disposition pending at time of sign out.    Final Clinical Impression(s) / ED Diagnoses Final diagnoses:  Acute pulmonary edema (HCC)  Acute combined systolic and diastolic congestive heart failure (HCC)  Respiratory distress  AKI (acute kidney injury) ([place])     This chart was dictated using voice recognition software.  Despite best efforts to proofread,  errors can occur which can change the documentation meaning.    Jeanell Sparrow, DO 01/20/23 1531

## 2023-01-20 NOTE — ED Provider Notes (Signed)
Care transferred to me.  CTA shows CHF but no PE.  He denies chest pain.  I think this troponin elevation, while at high, is probably secondary to renal dysfunction and CHF.  He has urinated over 400 cc after the IV Lasix.  Will admit to the hospitalist service.  4:42 PM Patient had some hypoxia on the 3L.  He was put back on high flow and seems to be doing better.  Discussed with Dr.Ghirmire, advises another 40 mg of Lasix.  At this point, patient's respiratory rate seems to be improved with the high flow.  For now we will put him in progressive.  He does not have any chest pain, I think MI is unlikely.  11:11 PM Patient has been reevaluated multiple times while in the emergency department.  He has remained well on the high flow oxygen.  He has diuresed quite well as well.  He is wanting to try to come off the high flow which I think is reasonable.  He is still waiting on transport to [facility].  Otherwise, he remained stable for admission to the hospital service.   Sherwood Gambler, MD 01/20/23 [phone]

## 2023-01-20 NOTE — ED Notes (Signed)
Per EDP, patient transitioned to low flow [place]. Patient placed on 3LNC at this time. Patient tolerating well at this time. SpO2 95%. Patient's SpO2 95% at this time. Patient states he feels like is work of breathing is the same, "I feel like I can breathe better on the regular oxygen".

## 2023-01-20 NOTE — Progress Notes (Signed)
Patient placed on HHFNC 50L and 38% FIO2. Patient appears to be tolerating well at this time with less distress.

## 2023-01-20 NOTE — ED Notes (Signed)
Report given to 3 Financial controller.  All questions answered

## 2023-01-20 NOTE — ED Triage Notes (Signed)
Pt c/o SHOB onset 4a. Associated leg swelling, distended abd. Hx CHF, COPD. RR 22, endorses increased work of breathing, increased usage of PRN lasix  On 2L at home, needs 3L [place] during triage.

## 2023-01-20 NOTE — Progress Notes (Signed)
Daughter asked for patient to be placed back on 3L [place]. She stated the patient felt like he couldn't breath as well on it and would rather have the 3l [place] back on. Patient agreed. Patient is tolerating the 3L [place] well with a saturation 93%

## 2023-01-21 DIAGNOSIS — N179 Acute kidney failure, unspecified: Secondary | ICD-10-CM | POA: Diagnosis not present

## 2023-01-21 DIAGNOSIS — N189 Chronic kidney disease, unspecified: Secondary | ICD-10-CM

## 2023-01-21 DIAGNOSIS — I4892 Unspecified atrial flutter: Secondary | ICD-10-CM

## 2023-01-21 DIAGNOSIS — I5023 Acute on chronic systolic (congestive) heart failure: Secondary | ICD-10-CM

## 2023-01-21 DIAGNOSIS — J9601 Acute respiratory failure with hypoxia: Secondary | ICD-10-CM | POA: Diagnosis not present

## 2023-01-21 DIAGNOSIS — E1169 Type 2 diabetes mellitus with other specified complication: Secondary | ICD-10-CM

## 2023-01-21 DIAGNOSIS — E1159 Type 2 diabetes mellitus with other circulatory complications: Secondary | ICD-10-CM

## 2023-01-21 DIAGNOSIS — J439 Emphysema, unspecified: Secondary | ICD-10-CM

## 2023-01-21 DIAGNOSIS — E785 Hyperlipidemia, unspecified: Secondary | ICD-10-CM

## 2023-01-21 DIAGNOSIS — I152 Hypertension secondary to endocrine disorders: Secondary | ICD-10-CM

## 2023-01-21 HISTORY — DX: Acute on chronic systolic (congestive) heart failure: I50.23

## 2023-01-21 LAB — GLUCOSE, CAPILLARY
Glucose-Capillary: 210 mg/dL — ABNORMAL HIGH (ref 70–99)
Glucose-Capillary: 233 mg/dL — ABNORMAL HIGH (ref 70–99)
Glucose-Capillary: 252 mg/dL — ABNORMAL HIGH (ref 70–99)
Glucose-Capillary: 254 mg/dL — ABNORMAL HIGH (ref 70–99)
Glucose-Capillary: 279 mg/dL — ABNORMAL HIGH (ref 70–99)

## 2023-01-21 LAB — MAGNESIUM: Magnesium: 2.5 mg/dL — ABNORMAL HIGH (ref 1.7–2.4)

## 2023-01-21 LAB — BASIC METABOLIC PANEL
Anion gap: 11 (ref 5–15)
BUN: 45 mg/dL — ABNORMAL HIGH (ref 8–23)
CO2: 18 mmol/L — ABNORMAL LOW (ref 22–32)
Calcium: 8.6 mg/dL — ABNORMAL LOW (ref 8.9–10.3)
Chloride: 105 mmol/L (ref 98–111)
Creatinine, Ser: 2 mg/dL — ABNORMAL HIGH (ref 0.61–1.24)
GFR, Estimated: 34 mL/min — ABNORMAL LOW (ref >60)
Glucose, Bld: 205 mg/dL — ABNORMAL HIGH (ref 70–99)
Potassium: 4.3 mmol/L (ref 3.5–5.1)
Sodium: 134 mmol/L — ABNORMAL LOW (ref 135–145)

## 2023-01-21 MED ORDER — APIXABAN 5 MG PO TABS
5.0000 mg | ORAL_TABLET | Freq: Two times a day (BID) | ORAL | Status: DC
  Administered 2023-01-21 – 2023-01-24 (×8): 5 mg via ORAL
  Filled 2023-01-21 (×8): qty 1

## 2023-01-21 MED ORDER — SENNOSIDES-DOCUSATE SODIUM 8.6-50 MG PO TABS: 1.0000 | ORAL_TABLET | Freq: Every evening | ORAL | Status: DC | PRN

## 2023-01-21 MED ORDER — PANTOPRAZOLE SODIUM 40 MG PO TBEC
40.0000 mg | DELAYED_RELEASE_TABLET | Freq: Every day | ORAL | Status: DC
  Administered 2023-01-21 – 2023-01-24 (×4): 40 mg via ORAL
  Filled 2023-01-21 (×4): qty 1

## 2023-01-21 MED ORDER — FUROSEMIDE 10 MG/ML IJ SOLN
60.0000 mg | Freq: Two times a day (BID) | INTRAMUSCULAR | Status: DC
  Administered 2023-01-21 – 2023-01-22 (×2): 60 mg via INTRAVENOUS
  Filled 2023-01-21 (×2): qty 6

## 2023-01-21 MED ORDER — POTASSIUM CHLORIDE 20 MEQ PO PACK
40.0000 meq | PACK | Freq: Two times a day (BID) | ORAL | Status: AC
  Administered 2023-01-21 – 2023-01-22 (×4): 40 meq via ORAL
  Filled 2023-01-21 (×4): qty 2

## 2023-01-21 MED ORDER — MAGNESIUM SULFATE 2 GM/50ML IV SOLN
2.0000 g | Freq: Once | INTRAVENOUS | Status: AC
  Administered 2023-01-21: 2 g via INTRAVENOUS
  Filled 2023-01-21: qty 50

## 2023-01-21 MED ORDER — OXYCODONE HCL 5 MG PO TABS: 5.0000 mg | ORAL_TABLET | ORAL | Status: DC | PRN

## 2023-01-21 MED ORDER — INSULIN ASPART 100 UNIT/ML IJ SOLN
0.0000 [IU] | Freq: Three times a day (TID) | INTRAMUSCULAR | Status: DC
  Administered 2023-01-21 (×3): 8 [IU] via SUBCUTANEOUS
  Administered 2023-01-22 (×2): 3 [IU] via SUBCUTANEOUS
  Administered 2023-01-22 – 2023-01-23 (×3): 8 [IU] via SUBCUTANEOUS
  Administered 2023-01-23 – 2023-01-24 (×2): 5 [IU] via SUBCUTANEOUS

## 2023-01-21 MED ORDER — ONDANSETRON HCL 4 MG PO TABS: 4.0000 mg | ORAL_TABLET | Freq: Four times a day (QID) | ORAL | Status: DC | PRN

## 2023-01-21 MED ORDER — ROSUVASTATIN CALCIUM 20 MG PO TABS
20.0000 mg | ORAL_TABLET | Freq: Every day | ORAL | Status: DC
  Administered 2023-01-21: 20 mg via ORAL
  Filled 2023-01-21: qty 1

## 2023-01-21 MED ORDER — ISOSORBIDE MONONITRATE ER 30 MG PO TB24
30.0000 mg | ORAL_TABLET | Freq: Every day | ORAL | Status: DC
  Administered 2023-01-21 – 2023-01-24 (×4): 30 mg via ORAL
  Filled 2023-01-21 (×4): qty 1

## 2023-01-21 MED ORDER — INSULIN DETEMIR 100 UNIT/ML ~~LOC~~ SOLN
15.0000 [IU] | Freq: Every day | SUBCUTANEOUS | Status: DC
  Administered 2023-01-21 – 2023-01-22 (×2): 15 [IU] via SUBCUTANEOUS
  Filled 2023-01-21 (×4): qty 0.15

## 2023-01-21 MED ORDER — AMIODARONE HCL 100 MG PO TABS
100.0000 mg | ORAL_TABLET | Freq: Every day | ORAL | Status: DC
  Administered 2023-01-21 – 2023-01-24 (×4): 100 mg via ORAL
  Filled 2023-01-21 (×4): qty 1

## 2023-01-21 MED ORDER — FUROSEMIDE 10 MG/ML IJ SOLN
80.0000 mg | Freq: Once | INTRAMUSCULAR | Status: AC
  Administered 2023-01-21: 80 mg via INTRAVENOUS
  Filled 2023-01-21: qty 8

## 2023-01-21 MED ORDER — ONDANSETRON HCL 4 MG/2ML IJ SOLN: 4.0000 mg | Freq: Four times a day (QID) | INTRAMUSCULAR | Status: DC | PRN

## 2023-01-21 MED ORDER — AMLODIPINE BESYLATE 10 MG PO TABS
10.0000 mg | ORAL_TABLET | Freq: Every day | ORAL | Status: DC
  Administered 2023-01-21 – 2023-01-22 (×2): 10 mg via ORAL
  Filled 2023-01-21 (×2): qty 1

## 2023-01-21 MED ORDER — INSULIN DETEMIR 100 UNIT/ML ~~LOC~~ SOLN
30.0000 [IU] | Freq: Every day | SUBCUTANEOUS | Status: DC
  Administered 2023-01-21: 30 [IU] via SUBCUTANEOUS
  Filled 2023-01-21 (×2): qty 0.3

## 2023-01-21 MED ORDER — FUROSEMIDE 40 MG PO TABS: 40.0000 mg | ORAL_TABLET | Freq: Every day | ORAL | Status: DC | PRN

## 2023-01-21 MED ORDER — HYDRALAZINE HCL 50 MG PO TABS
50.0000 mg | ORAL_TABLET | Freq: Three times a day (TID) | ORAL | Status: DC
  Administered 2023-01-21 – 2023-01-22 (×6): 50 mg via ORAL
  Filled 2023-01-21 (×6): qty 1

## 2023-01-21 MED ORDER — ALBUTEROL SULFATE (2.5 MG/3ML) 0.083% IN NEBU
2.5000 mg | INHALATION_SOLUTION | RESPIRATORY_TRACT | Status: DC | PRN
  Administered 2023-01-22: 2.5 mg via RESPIRATORY_TRACT
  Filled 2023-01-21: qty 3

## 2023-01-21 MED ORDER — INSULIN ASPART 100 UNIT/ML IJ SOLN: 0.0000 [IU] | Freq: Three times a day (TID) | INTRAMUSCULAR | Status: DC

## 2023-01-21 MED ORDER — ACETAMINOPHEN 325 MG PO TABS
650.0000 mg | ORAL_TABLET | Freq: Four times a day (QID) | ORAL | Status: DC | PRN
  Administered 2023-01-22: 650 mg via ORAL
  Filled 2023-01-21: qty 2

## 2023-01-21 MED ORDER — ACETAMINOPHEN 650 MG RE SUPP: 650.0000 mg | Freq: Four times a day (QID) | RECTAL | Status: DC | PRN

## 2023-01-21 NOTE — Assessment & Plan Note (Signed)
CKD stage 3b  Renal function with serum cr at 2,0 with K at 4,3 and serum bicarbonate at 18, Na 138, Mg 2.5   Plan to continue diuresis with furosemide and follow up renal function in am.

## 2023-01-21 NOTE — Progress Notes (Signed)
   01/20/23 2358  Vitals  Temp 98.4 F (36.9 C)  Temp Source Oral  BP 138/76  MAP (mmHg) 80  BP Location Right Arm  Patient Position (if appropriate) Sitting  Pulse Rate 87  ECG Heart Rate 87  Resp 18  Level of Consciousness  Level of Consciousness Alert  MEWS COLOR  MEWS Score Color Green  Oxygen Therapy  SpO2 98 %  O2 Device Nasal Cannula  O2 Flow Rate (L/min) 3 L/min  Pain Assessment  Pain Scale 0-10  Pain Score 0  MEWS Score  MEWS Temp 0  MEWS Systolic 0  MEWS Pulse 0  MEWS RR 0  MEWS LOC 0  MEWS Score 0   Admitted pt to rm 3E23 from DB via carelink, pt is alert and oriented, denies pain at this time, not in respiratory distress, placed on cardiac monitor, CCMD made aware.

## 2023-01-21 NOTE — Hospital Course (Addendum)
[name] was admitted to the hospital with the working diagnosis of heart failure decompensation.   77 yo male with the past medical history of coronary artery disease, T2DM, hypertension, heart failure and COPD who presented with worsening dyspnea. He follows with the heart failure clinic and currently taking furosemide only as needed.  Patient developed acute dyspnea 48 hrs ago that rapidly progressed to orthopnea, PND and lower extremity edema. Positive weight gain 4 lbs from baseline, he took furosemide 40 mg, and then called EMS. No chest pain or angina, no recent change in his medications or life style.  On his initial physical examination his blood pressure was 151/66, HR 79, RR 19 and 02 saturation 95%, he was in respiratory distress, lungs with rales bilaterally with no wheezing, heart with S1 and S2 present and rhythmic with no gallops, or murmurs, abdomen with no distention and positive lower extremity edema.   Na 137, K 3,6 Cl 107 bicarbonate 18 glucose 162 bun 52 cr 1,98 BNP 1,932 High sensitive troponin 714 and 547  Wbc 9,6 hgb 13,4 plt 252  Chest radiograph with cardiomegaly with bilateral hilar vascular congestion, fluid in the right fissure and bilateral pleural effusions,   CT chest with no pulmonary embolism. Bilateral ground glass opacities, with bilateral pleural effusions, more right than left. Centrilobular emphysema.  3,0 cm upper pole right renal lesion indeterminate density, enlarged compared to abdominal CT from 2020, (needs follow up non emergent MRI or CT).   EKG 87 bpm, normal axis, right bundle branch block, 1st degree AV block, sinus rhythm with no significant ST segment changes, negative T wave lead III and AvF. (Not new changes).   Patient was placed on IV furosemide for diuresis.

## 2023-01-21 NOTE — Assessment & Plan Note (Addendum)
Continue glucose cover and monitoring with insulin sliding scale.  Uncontrolled T2Dm with hyperglycemia.  Continue basal insulin, decrease to 15 units to prevent hypoglycemia.

## 2023-01-21 NOTE — Assessment & Plan Note (Signed)
No clinical signs of exacerbation, continue oxymetry monitoring.  ?

## 2023-01-21 NOTE — Assessment & Plan Note (Signed)
Echocardiogram with reduced LV systolic function 35 to [id number], hypokinesis of the left ventricular, entire infero septal wall, inferior wall and inferior lateral wall. RV systolic function with preserved, RVSP 45.5 mmHg, mild to moderate TR, mild aortic stenosis.   Urine output [id number] ml Systolic blood pressure 123456 to 111 mmHg.  Plan to continue diuresis with furosemide 60 mg IV q12 After load reduction with hydralazine and isosorbide.  Blood pressure control with amlodipine. No RAAS inhibition due to low GFR.

## 2023-01-21 NOTE — Progress Notes (Signed)
Heart Failure Navigator Progress Note  Assessed for Heart & Vascular TOC clinic readiness.  Patient does not meet criteria due to Advanced Heart Failure Team patient.   Navigator will sign off at this time.    Michal Callicott, BSN, RN Heart Failure Nurse Navigator Secure Chat Only   

## 2023-01-21 NOTE — TOC Initial Note (Signed)
Transition of Care [facility]) - Initial/Assessment Note    Patient Details  Name: [name] MRN: [id number] Date of Birth: [birth year]  [facility] CM/SW Contact:    Zenon Mayo, RN Phone Number: 01/21/2023, 4:24 PM  Clinical Narrative:                 NCM spoke with patient at the bedside, he lives at home with wife. His daughter , [name] is at the bedside.  He gets medications from [facility] on [address], he still drives, has PCP.  TOC following.  Expected Discharge Plan: Home/Self Care Barriers to Discharge: Continued Medical Work up   Patient Goals and CMS Choice Patient states their goals for this hospitalization and ongoing recovery are:: return home   Choice offered to / list presented to : NA      Expected Discharge Plan and Services In-house Referral: NA Discharge Planning Services: CM Consult Post Acute Care Choice: NA Living arrangements for the past 2 months: Single Family Home                 DME Arranged: N/A DME Agency: NA       HH Arranged: NA          Prior Living Arrangements/Services Living arrangements for the past 2 months: Single Family Home Lives with:: Spouse Patient language and need for interpreter reviewed:: Yes Do you feel safe going back to the place where you live?: Yes      Need for Family Participation in Patient Care: Yes (Comment) Care giver support system in place?: Yes (comment) Current home services: DME (home oxygen- [employer], scale,) Criminal Activity/Legal Involvement Pertinent to Current Situation/Hospitalization: No - Comment as needed  Activities of Daily Living Home Assistive Devices/Equipment: CBG Meter ADL Screening (condition at time of admission) Patient's cognitive ability adequate to safely complete daily activities?: Yes Is the patient deaf or have difficulty hearing?: No Does the patient have difficulty seeing, even when wearing glasses/contacts?: No Does the patient have  difficulty concentrating, remembering, or making decisions?: No Patient able to express need for assistance with ADLs?: Yes Does the patient have difficulty dressing or bathing?: No Independently performs ADLs?: Yes (appropriate for developmental age) Does the patient have difficulty walking or climbing stairs?: Yes Weakness of Legs: None Weakness of Arms/Hands: None  Permission Sought/Granted                  Emotional Assessment Appearance:: Appears stated age Attitude/Demeanor/Rapport: Engaged Affect (typically observed): Appropriate Orientation: : Oriented to Self, Oriented to Place, Oriented to  Time, Oriented to Situation Alcohol / Substance Use: Not Applicable Psych Involvement: No (comment)  Admission diagnosis:  Acute pulmonary edema (HCC) [J81.0] Respiratory distress [R06.03] Acute combined systolic and diastolic congestive heart failure (HCC) [I50.41] AKI (acute kidney injury) ([place]) [N17.9] Acute hypoxemic respiratory failure ([place]) [J96.01] Acute hypoxic respiratory failure (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[place]) 12/31/2022   Acute kidney injury superimposed on chronic kidney disease ([place]) 10/19/2022   Hyperkalemia 10/17/2022   Acute renal failure superimposed on stage 3a chronic kidney disease ([place]) 10/17/2022   Chronic combined systolic and diastolic CHF (congestive heart failure) ([place]) 10/17/2022   Atrial flutter ([place]) 10/17/2022   COPD (chronic obstructive pulmonary disease) ([place]) 10/17/2022   Acute  on chronic diastolic (congestive) heart failure ([place]) 09/20/2022   Acute CHF (congestive heart failure) ([place]) 11/30/2021   Acute on chronic diastolic CHF (congestive heart failure) ([place]) 07/08/2021   Malignant neoplasm of prostate ([place])  11/29/2019   Chronic multifocal osteomyelitis, right ankle and foot ([place]) 03/23/2018   Diabetic ulcer of toe of right foot associated with type 2 diabetes mellitus, with fat layer exposed ([place]) 03/05/2018   Cellulitis of extremity 11/19/2017   GERD (gastroesophageal reflux disease) 02/05/2016   Benign essential hypertension 01/28/2016   Insulin dependent type 2 diabetes mellitus ([place]) 01/28/2016   Ischemic heart disease 05/19/2014   Peripheral vascular disease, unspecified ([place]) 01/12/2014   Atherosclerosis of native arteries of the extremities with ulceration(440.23) 12/08/2013   Wound drainage 07/25/2013   Carotid artery stenosis 07/15/2013   Right leg claudication ([place]) 07/08/2013   Right carotid bruit 07/08/2013   CAD (coronary artery disease) 06/19/2011   Hypertension associated with diabetes ([place]) 06/19/2011   Hyperlipidemia associated with type 2 diabetes mellitus ([place]) 06/19/2011   Atherosclerotic heart disease of native coronary artery without angina pectoris 06/19/2011   PCP:  Haywood Pao, MD Pharmacy:   [facility] [address] [facility] ORDER) [place], [place] [address] Phone: [phone] Fax: [place] [address] [place] - [address] [place] [address] Phone: [phone] Fax: [place], [place] - [place] [address] [place] [address] Phone: [phone] Fax: [phone]     Social Determinants of Health (SDOH) Social History: SDOH Screenings   Food Insecurity: No Food Insecurity (01/21/2023)  Housing: Low Risk  (01/21/2023)  Transportation Needs: No Transportation Needs (01/21/2023)  Utilities: Not At Risk (01/21/2023)  Tobacco Use: Medium Risk (01/20/2023)   SDOH Interventions: Housing Interventions: Intervention Not  Indicated   Readmission Risk Interventions    01/21/2023    4:21 PM  Readmission Risk Prevention Plan  Transportation Screening Complete  Medication Review ([place]) Complete  HRI or [place] Complete  [place] Not Applicable

## 2023-01-21 NOTE — Assessment & Plan Note (Signed)
Currently patient sinus rhythm, plan to continue amiodarone for rate control and rhythm control and anticoagulation with apixaban. Continue telemetry monitoring.

## 2023-01-21 NOTE — Assessment & Plan Note (Addendum)
Patient had CBAG in the past.  No angina Troponin elevation, but trending down, (possible demand ischemia).  No EKG changes.  He has wall motion abnormalities in his echocardiogram from 12/2022.  Outpatient records personally reviewed, and plan was for medical therapy unless patient develops angina. Continue with statin therapy. No antiplatelet therapy due to full anticoagulation.

## 2023-01-21 NOTE — H&P (Signed)
History and Physical    [name] [id number] DOB: [birth year] DOA: 01/20/2023  PCP: Haywood Pao, MD   Chief Complaint: sob  HPI: [name] is a 77 y.o. male with medical history significant of CAD, type 2 diabetes, GERD, hypertension, hyperlipidemia, prior CABG CHF, COPD presents to the emergency department due to worsening shortness of breath.  Patient typically uses 2 to 3 L of oxygen at home as needed.  This morning he had acute onset shortness of breath.  He developed orthopnea and paroxysmal nocturnal dyspnea.  He became tired.  He called EMS for further assessment.  He was transferred to [place] emergency department where he was found to be afebrile and hemodynamically stable.  He was tachypneic with increased work of breathing.  Labs were obtained and on presentation which revealed sodium 137, bicarb 18, creatinine 1.9, WBC 9.6, hemoglobin 13.4, BNP 1939, troponin 714, 547.  Chest x-ray was obtained which showed diffuse interstitial opacities likely pulmonary edema and bilateral pleural effusions.  CT pulmonary embolism study was obtained which demonstrated no evidence of PE however bilateral pleural effusions consistent with volume overload.  There was also a 3 cm right renal lesion enlarged compared to prior with recommended outpatient follow-up.  Patient was placed on high flow nasal cannula.  He was given IV Lasix.  Patient was refusing BiPAP.  His respiratory status improved he was transferred to [facility] for further assessment.  On arrival he was resting comfortably.  He had received 2 doses of IV Lasix with improvement in his respiratory status.  He denied other complaints.   Review of Systems: Review of Systems  All other systems reviewed and are negative.    As per HPI otherwise 10 point review of systems negative.   Allergies  Allergen Reactions   Aldactone [Spironolactone] Other (See Comments)    Swollen breast   Lipitor [Atorvastatin Calcium] Other (See  Comments)    Muscle ache   Sulfa Drugs Cross Reactors Other (See Comments)    "raw spots"   Sulfamethoxazole-Trimethoprim     Other reaction(s): Rash   Codeine Rash    Past Medical History:  Diagnosis Date   Arthritis    CAD (coronary artery disease)    Carotid artery occlusion    Diabetes mellitus    fasting 100-120s   Dizziness    Dysrhythmia    skips beats   GERD (gastroesophageal reflux disease)    History of kidney stones    Hyperlipidemia    Hypertension    IHD (ischemic heart disease)    Prior CABG in 2003   Normal nuclear stress test June 2012   Peripheral vascular disease ([place])    Prostate cancer ([place])    Torn rotator cuff     Past Surgical History:  Procedure Laterality Date   ABDOMINAL AORTAGRAM N/A 12/21/2013   Procedure: ABDOMINAL Maxcine Ham;  Surgeon: Elam Dutch, MD;  Location: [facility] CATH LAB;  Service: Cardiovascular;  Laterality: N/A;   ABDOMINAL AORTOGRAM N/A 11/13/2017   Procedure: ABDOMINAL AORTOGRAM;  Surgeon: Elam Dutch, MD;  Location: [place] CV LAB;  Service: Cardiovascular;  Laterality: N/A;   APPENDECTOMY     ARCH AORTOGRAM  08/26/2013   Procedure: ARCH AORTOGRAM;  Surgeon: Elam Dutch, MD;  Location: [facility] CATH LAB;  Service: Cardiovascular;;   CARDIAC CATHETERIZATION  05/03/2002   EF 40-45%   CARDIOVASCULAR STRESS TEST  08/10/2006   EF 65%   CARDIOVERSION N/A 10/22/2022   Procedure: CARDIOVERSION;  Surgeon:  Bensimhon, Shaune Pascal, MD;  Location: [facility] ENDOSCOPY;  Service: Cardiovascular;  Laterality: N/A;   CAROTID ENDARTERECTOMY     CAROTID STENT INSERTION Left 08/26/2013   Procedure: CAROTID STENT INSERTION;  Surgeon: Elam Dutch, MD;  Location: [facility] CATH LAB;  Service: Cardiovascular;  Laterality: Left;  internal carotid   CORONARY ARTERY BYPASS GRAFT  04/2002   DOPPLER ECHOCARDIOGRAPHY  08/12/2002   EF 80-85%   ENDARTERECTOMY Left 07/20/2013   Procedure: ATTEMPTED ENDARTERECTOMY CAROTID;  Surgeon: Conrad Beckley, MD;  Location:  [place];  Service: Vascular;  Laterality: Left;   HERNIA REPAIR     LOWER EXTREMITY ANGIOGRAPHY  11/13/2017   Procedure: Lower Extremity Angiography;  Surgeon: Elam Dutch, MD;  Location: [place] CV LAB;  Service: Cardiovascular;;   PERIPHERAL VASCULAR INTERVENTION Right 11/13/2017   Procedure: PERIPHERAL VASCULAR INTERVENTION;  Surgeon: Elam Dutch, MD;  Location: [place] CV LAB;  Service: Cardiovascular;  Laterality: Right;  superficial femoral   RIGHT HEART CATH N/A 09/23/2022   Procedure: RIGHT HEART CATH;  Surgeon: Jolaine Artist, MD;  Location: [place] [facility] CV LAB;  Service: Cardiovascular;  Laterality: N/A;   SHOULDER SURGERY     TEE WITHOUT CARDIOVERSION N/A 10/22/2022   Procedure: TRANSESOPHAGEAL ECHOCARDIOGRAM (TEE);  Surgeon: Jolaine Artist, MD;  Location: [facility] ENDOSCOPY;  Service: Cardiovascular;  Laterality: N/A;     reports that he quit smoking about 21 years ago. His smoking use included cigarettes. He has a 20.00 pack-year smoking history. He has never been exposed to tobacco smoke. He has never used smokeless tobacco. He reports that he does not drink alcohol and does not use drugs.  Family History  Problem Relation Age of Onset   Heart attack Mother    Diabetes Mother    Heart disease Mother    Hypertension Mother    Heart attack Father    Heart disease Father    Hypertension Father    Diabetes Brother    Diabetes Sister    Diabetes Daughter    Heart disease Daughter    Hypertension Daughter    Diabetes Sister    Breast cancer Neg Hx    Prostate cancer Neg Hx    Colon cancer Neg Hx    Pancreatic cancer Neg Hx     Prior to Admission medications   Medication Sig Start Date End Date Taking? Authorizing Provider  albuterol (VENTOLIN HFA) 108 (90 Base) MCG/ACT inhaler Inhale 2 puffs into the lungs every 6 (six) hours as needed for wheezing or shortness of breath. 07/02/21  Yes [provider]  amiodarone (PACERONE) 200 MG tablet Take  0.5 tablets (100 mg total) by mouth daily. 11/07/22  Yes Clegg, [name] D, NP  amLODipine (NORVASC) 10 MG tablet Take 1 tablet (10 mg total) by mouth daily. 01/02/23  Yes Bensimhon, Shaune Pascal, MD  apixaban (ELIQUIS) 5 MG TABS tablet Take 1 tablet (5 mg total) by mouth 2 (two) times daily. 10/07/22  Yes [place], Maricela Bo, FNP  Cholecalciferol (VITAMIN D-3) 125 MCG (5000 UT) TABS Take 5,000 Units by mouth daily.   Yes [provider]  cyanocobalamin (VITAMIN B12) 1000 MCG tablet Take 1,000 mcg by mouth daily.   Yes [provider]  dapagliflozin propanediol (FARXIGA) 10 MG TABS tablet Take 10 mg by mouth daily.   Yes [provider]  ferrous sulfate 325 (65 FE) MG tablet Take 325 mg by mouth daily with breakfast.   Yes [provider]  furosemide (LASIX) 40  MG tablet Take 40 mg by mouth daily as needed for edema.   Yes [provider]  hydrALAZINE (APRESOLINE) 50 MG tablet Take 1 tablet (50 mg total) by mouth 3 (three) times daily. 12/04/22  Yes Bensimhon, Shaune Pascal, MD  insulin NPH Human (NOVOLIN N) 100 UNIT/ML injection Inject 35-40 Units into the skin See admin instructions. Inject 40 units into the skin in the morning and 35 units at night 09/08/19  Yes [provider]  insulin regular (NOVOLIN R) 100 units/mL injection Inject 6-15 Units into the skin 3 (three) times daily before meals. Per sliding scale 09/08/19  Yes [provider]  isosorbide mononitrate (IMDUR) 30 MG 24 hr tablet Take 1 tablet (30 mg total) by mouth daily. 12/04/22  Yes Bensimhon, Shaune Pascal, MD  pantoprazole (PROTONIX) 40 MG tablet Take 40 mg by mouth daily.   Yes [provider]  rosuvastatin (CRESTOR) 20 MG tablet Take 1 tablet by mouth once daily 08/18/22  Yes Nahser, Wonda Cheng, MD  nitroGLYCERIN (NITROSTAT) 0.4 MG SL tablet Place 1 tablet (0.4 mg total) under the tongue every 5 (five) minutes as needed for chest pain. 08/31/20   Nahser, Wonda Cheng, MD    Physical  Exam: Vitals:   01/20/23 2245 01/20/23 2256 01/20/23 2300 01/20/23 2358  BP:   (!) 151/66 138/76  Pulse: 80 80 79 87  Resp: 13 17 19 18   Temp:    98.4 F (36.9 C)  TempSrc:    Oral  SpO2: 100% 95% 95% 98%   Physical Exam Vitals reviewed.  Constitutional:      Appearance: He is normal weight.  HENT:     Nose: Nose normal.     Mouth/Throat:     Mouth: Mucous membranes are moist.     Pharynx: Oropharynx is clear.  Eyes:     Conjunctiva/sclera: Conjunctivae normal.     Pupils: Pupils are equal, round, and reactive to light.  Cardiovascular:     Rate and Rhythm: Normal rate.     Pulses: Normal pulses.     Heart sounds: Normal heart sounds.  Pulmonary:     Effort: Respiratory distress present.     Breath sounds: Normal breath sounds.  Abdominal:     General: Abdomen is flat. Bowel sounds are normal.  Musculoskeletal:        General: Normal range of motion.  Skin:    General: Skin is warm.  Neurological:     General: No focal deficit present.     Mental Status: He is alert. Mental status is at baseline.  Psychiatric:        Mood and Affect: Mood normal.        Labs on Admission: I have personally reviewed the patients's labs and imaging studies.  Assessment/Plan Principal Problem:   Acute hypoxemic respiratory failure (HCC) Active Problems:   Acute hypoxic respiratory failure (HCC)   # Acute on chronic hypoxic respiratory failure secondary to CHF exacerbation, POA, active  -patient last had echocardiogram performed 3/8 which demonstrated EF 35 to 40%.  There was elevated pulmonary artery systolic pressure as well. -Patient floridly volume overloaded -now on [place] Plan: Repeat IV Lasix 80 mg daily Check labs in the morning Continue goal-directed medical therapy with hydralazine, Imdur Patient likely needs beta-blocker  Paroxysmal A-fib, POA, active - Continue home amiodarone 100 mg daily and anticoagulation with Eliquis  Hypertension-continue amlodipine  Type  2 diabetes-patient uses 7030 at home.  Will plan to place on 30 units Lantus  Hyperlipidemia-continue Crestor  GERD-continue Protonix   Admission status: Inpatient Progressive  Certification: The appropriate patient status for this patient is INPATIENT. Inpatient status is judged to be reasonable and necessary in order to provide the required intensity of service to ensure the patient's safety. The patient's presenting symptoms, physical exam findings, and initial radiographic and laboratory data in the context of their chronic comorbidities is felt to place them at high risk for further clinical deterioration. Furthermore, it is not anticipated that the patient will be medically stable for discharge from the hospital within 2 midnights of admission.   * I certify that at the point of admission it is my clinical judgment that the patient will require inpatient hospital care spanning beyond 2 midnights from the point of admission due to high intensity of service, high risk for further deterioration and high frequency of surveillance required.Emilee Hero MD [facility] If 7PM-7AM, please cont,A-fibApixaban for A-fib, amiodarone 4 takes home regimen ofPatientact night-coverage [email or web address]  01/21/2023, 12:13 AM    40 mg of Lasix.

## 2023-01-21 NOTE — Assessment & Plan Note (Signed)
Continue blood pressure control with amlodipine.  

## 2023-01-21 NOTE — Progress Notes (Signed)
Inpatient Diabetes Program Recommendations  AACE/ADA: New Consensus Statement on Inpatient Glycemic Control (2015)  Target Ranges:  Prepandial:   less than 140 mg/dL      Peak postprandial:   less than 180 mg/dL (1-2 hours)      Critically ill patients:  140 - 180 mg/dL   Lab Results  Component Value Date   GLUCAP 254 (H) 01/21/2023   HGBA1C 7.0 (H) 09/20/2022    Review of Glycemic Control  Latest Reference Range & Units 01/21/23 00:20 01/21/23 06:14 01/21/23 11:31  Glucose-Capillary 70 - 99 mg/dL 210 (H) 279 (H) 254 (H)   Diabetes history: DM 2 Outpatient Diabetes medications:  Novolin N 40 unit q AM and 35 units q PM Novolin R 6-15 units tid with meals Current orders for Inpatient glycemic control:  Levemir 30 units q HS Novolog 0-15 units tid with meals Inpatient Diabetes Program Recommendations:   May consider changing Levemir to 25 units bid (first dose this morning).    Thanks,  Adah Perl, RN, BC-ADM Inpatient Diabetes Coordinator Pager [phone]  (8a-5p)

## 2023-01-21 NOTE — Progress Notes (Addendum)
Progress Note   Patient: [name] [id number] DOB: [birth year] DOA: 01/20/2023     1 DOS: the patient was seen and examined on 01/21/2023   Brief hospital course: [name] was admitted to the hospital with the working diagnosis of heart failure decompensation.   77 yo male with the past medical history of coronary artery disease, T2DM, hypertension, heart failure and COPD who presented with worsening dyspnea. He follows with the heart failure clinic and currently taking furosemide only as needed.  Patient developed acute dyspnea 48 hrs ago that rapidly progressed to orthopnea, PND and lower extremity edema. Positive weight gain 4 lbs from baseline, he took furosemide 40 mg, and then called EMS. No chest pain or angina, no recent change in his medications or life style.  On his initial physical examination his blood pressure was 151/66, HR 79, RR 19 and 02 saturation 95%, he was in respiratory distress, lungs with rales bilaterally with no wheezing, heart with S1 and S2 present and rhythmic with no gallops, or murmurs, abdomen with no distention and positive lower extremity edema.   Na 137, K 3,6 Cl 107 bicarbonate 18 glucose 162 bun 52 cr 1,98 BNP 1,932 High sensitive troponin 714 and 547  Wbc 9,6 hgb 13,4 plt 252  Chest radiograph with cardiomegaly with bilateral hilar vascular congestion, fluid in the right fissure and bilateral pleural effusions,   CT chest with no pulmonary embolism. Bilateral ground glass opacities, with bilateral pleural effusions, more right than left. Centrilobular emphysema.  3,0 cm upper pole right renal lesion indeterminate density, enlarged compared to abdominal CT from 2020, (needs follow up non emergent MRI or CT).   EKG 87 bpm, normal axis, right bundle branch block, 1st degree AV block, sinus rhythm with no significant ST segment changes, negative T wave lead III and AvF. (Not new changes).   Patient was placed on IV furosemide for diuresis.    Assessment and Plan: Acute on chronic systolic CHF (congestive heart failure) (HCC) Echocardiogram with reduced LV systolic function 35 to [id number], hypokinesis of the left ventricular, entire infero septal wall, inferior wall and inferior lateral wall. RV systolic function with preserved, RVSP 45.5 mmHg, mild to moderate TR, mild aortic stenosis.   Urine output [id number] ml Systolic blood pressure 123456 to 111 mmHg.  Plan to continue diuresis with furosemide 60 mg IV q12 After load reduction with hydralazine and isosorbide.  Blood pressure control with amlodipine. No RAAS inhibition due to low GFR.   Hypertension associated with diabetes ([name]) Continue blood pressure control with amlodipine.   Acute kidney injury superimposed on chronic kidney disease ([name]) CKD stage 3b  Renal function with serum cr at 2,0 with K at 4,3 and serum bicarbonate at 18, Na 138, Mg 2.5   Plan to continue diuresis with furosemide and follow up renal function in am.   Atrial flutter (HCC) Currently patient sinus rhythm, plan to continue amiodarone for rate control and rhythm control and anticoagulation with apixaban. Continue telemetry monitoring.    CAD (coronary artery disease) Patient had CBAG in the past.  No angina Troponin elevation, but trending down, (possible demand ischemia).  No EKG changes.  He has wall motion abnormalities in his echocardiogram from 12/2022.  Outpatient records personally reviewed, and plan was for medical therapy unless patient develops angina. Continue with statin therapy. No antiplatelet therapy due to full anticoagulation.   Hyperlipidemia associated with type 2 diabetes mellitus ([name]) Continue glucose cover and monitoring with insulin sliding scale.  Uncontrolled  T2Dm with hyperglycemia.  Continue basal insulin, decrease to 15 units to prevent hypoglycemia.   COPD (chronic obstructive pulmonary disease) (HCC) No clinical signs of exacerbation, continue oxymetry  monitoring.         Subjective: Patient is feeling better but not back to his baseline, continue to have dyspnea and lower extremity edema.   Physical Exam: Vitals:   01/21/23 0015 01/21/23 0310 01/21/23 0748 01/21/23 1133  BP:  131/70 (!) 146/74 (!) 111/47  Pulse:  81 80 (!) 59  Resp:  18 18 20   Temp:  98.4 F (36.9 C) 98.4 F (36.9 C)   TempSrc:  Oral Oral   SpO2:  96% 98% 96%  Weight: 81.7 kg     Height: 5\' 10"  (1.778 m)      Neurology awake and alert ENT with mild pallor Cardiovascular with S1 and S2 present and regular with positive murmur at the right lower sternal border, no gallops, or rubs Positive moderate JVD Positive lower extremity edema + Respiratory with rales bilaterally with no wheezing or rhonchi Abdomen with no distention  Data Reviewed:    Family Communication: I spoke with patient's daughter at the bedside, we talked in detail about patient's condition, plan of care and prognosis and all questions were addressed.   Disposition: Status is: Inpatient Remains inpatient appropriate because: heart failure on IV furosemide.   Planned Discharge Destination: Home    Author: Tawni Millers, MD 01/21/2023 6:17 PM  For on call review [email or web address].

## 2023-01-21 NOTE — Progress Notes (Signed)
PROGRESS NOTE    [name]  [id number] DOB: August 15, 1946 DOA: 01/20/2023 PCP: Haywood Pao, MD  76/M w CAD, type 2 diabetes, COPD/resp failure on 2-3L Home O2, GERD, hypertension, hyperlipidemia, prior CABG CHF, COPD presented to the ED w shortness of breath. 3/26 developed acute onset shortness of breath w/orthopnea and paroxysmal nocturnal dyspnea.  In ED,tachypneic with increased work of breathing.  Labs creatinine 1.9, BNP 1939, troponin 714, 547.  CXR w/  diffuse interstitial opacities likely pulmonary edema and bilateral pleural effusions.  CTA no evidence of PE however bilateral pleural effusions consistent with volume overload.  There was also a 3 cm right renal lesion enlarged compared to prior with recommended outpatient follow-up.  -placed on high flow nasal cannula.   given IV Lasix,   Subjective:  Assessment and Plan:  Acute on chronic hypoxic respiratory failure  ACute on chronic systolic CHF -last ECHO 3/8 which demonstrated EF 35 to 40%.  There was elevated pulmonary artery systolic pressure as well. -Diuresed with IV Lasix he is 5.6 L negative, creatinine has trended up to 2.5 today, hold further diuretics -Cards following -continue  hydralazine, Imdur  AKI on CKD 3a -Baseline creatinine 1.5-2, now trended up to 2.5 overnight -Diuresed aggressively and got contrast for CTA -Avoid hypotension, BMP in a.m.   Paroxysmal A-fib, atrial flutter -Now in sinus rhythm - Continue home amiodarone 100 mg daily and anticoagulation with Eliquis  CAD/CABG 2003 -Recent echo consistent with inferolateral MI, medical management recommended unless develops angina  R Renal lesion -noted on CT, needs FU   Hypertension -continue amlodipine   Type 2 diabetes -patient uses 7030 at home.   - placed on 30 units Lantus   Hyperlipidemia-continue Crestor   GERD-continue Protonix  DVT prophylaxis: apixaban Code Status: Full code Family Communication: None  present Disposition Plan: Home in 1 to 2 days  Consultants:    Procedures:   Antimicrobials:    Objective: Vitals:   01/21/23 0015 01/21/23 0310 01/21/23 0748 01/21/23 1133  BP:  131/70 (!) 146/74 (!) 111/47  Pulse:  81 80 (!) 59  Resp:  18 18 20   Temp:  98.4 F (36.9 C) 98.4 F (36.9 C)   TempSrc:  Oral Oral   SpO2:  96% 98% 96%  Weight: 81.7 kg     Height: 5\' 10"  (1.778 m)       Intake/Output Summary (Last 24 hours) at 01/21/2023 1745 Last data filed at 01/21/2023 1700 Gross per 24 hour  Intake 720 ml  Output 4830 ml  Net -4110 ml   Filed Weights   01/21/23 0015  Weight: 81.7 kg    Examination:   Data Reviewed:   CBC: Recent Labs  Lab 01/20/23 1357 01/20/23 1452  WBC 9.6  --   NEUTROABS 7.8*  --   HGB 13.4 11.6*  HCT 44.1 34.0*  MCV 87.8  --   PLT 252  --    Basic Metabolic Panel: Recent Labs  Lab 01/20/23 1357 01/20/23 1452 01/21/23 0047  NA 137 139 134*  K 3.6 3.3* 4.3  CL 107  --  105  CO2 18*  --  18*  GLUCOSE 162*  --  205*  BUN 52*  --  45*  CREATININE 1.98*  --  2.00*  CALCIUM 9.8  --  8.6*  MG  --   --  2.5*   GFR: Estimated Creatinine Clearance: 33 mL/min (A) (by C-G formula based on SCr of 2 mg/dL (H)). Liver Function Tests:  No results for input(s): "AST", "ALT", "ALKPHOS", "BILITOT", "PROT", "ALBUMIN" in the last 168 hours. No results for input(s): "LIPASE", "AMYLASE" in the last 168 hours. No results for input(s): "AMMONIA" in the last 168 hours. Coagulation Profile: No results for input(s): "INR", "PROTIME" in the last 168 hours. Cardiac Enzymes: No results for input(s): "CKTOTAL", "CKMB", "CKMBINDEX", "TROPONINI" in the last 168 hours. BNP (last 3 results) No results for input(s): "PROBNP" in the last 8760 hours. HbA1C: No results for input(s): "HGBA1C" in the last 72 hours. CBG: Recent Labs  Lab 01/21/23 0020 01/21/23 0614 01/21/23 1131 01/21/23 1551  GLUCAP 210* 279* 254* 252*   Lipid Profile: No results  for input(s): "CHOL", "HDL", "LDLCALC", "TRIG", "CHOLHDL", "LDLDIRECT" in the last 72 hours. Thyroid Function Tests: No results for input(s): "TSH", "T4TOTAL", "FREET4", "T3FREE", "THYROIDAB" in the last 72 hours. Anemia Panel: No results for input(s): "VITAMINB12", "FOLATE", "FERRITIN", "TIBC", "IRON", "RETICCTPCT" in the last 72 hours. Urine analysis:    Component Value Date/Time   COLORURINE STRAW (A) 10/19/2022 0628   APPEARANCEUR CLEAR 10/19/2022 0628   LABSPEC 1.009 10/19/2022 0628   PHURINE 5.0 10/19/2022 0628   GLUCOSEU >=500 (A) 10/19/2022 0628   HGBUR NEGATIVE 10/19/2022 0628   BILIRUBINUR NEGATIVE 10/19/2022 0628   KETONESUR NEGATIVE 10/19/2022 0628   PROTEINUR NEGATIVE 10/19/2022 0628   UROBILINOGEN 1.0 07/19/2013 1223   NITRITE NEGATIVE 10/19/2022 0628   LEUKOCYTESUR NEGATIVE 10/19/2022 0628   Sepsis Labs: @LABRCNTIP (procalcitonin:4,lacticidven:4)  )No results found for this or any previous visit (from the past 240 hour(s)).   Radiology Studies: CT Angio Chest PE W and/or Wo Contrast  Result Date: 01/20/2023 CLINICAL DATA:  Shortness of breath beginning this morning. Leg swelling. History of CHF. COPD. Prostate cancer. EXAM: CT ANGIOGRAPHY CHEST WITH CONTRAST TECHNIQUE: Multidetector CT imaging of the chest was performed using the standard protocol during bolus administration of intravenous contrast. Multiplanar CT image reconstructions and MIPs were obtained to evaluate the vascular anatomy. RADIATION DOSE REDUCTION: This exam was performed according to the departmental dose-optimization program which includes automated exposure control, adjustment of the mA and/or kV according to patient size and/or use of iterative reconstruction technique. CONTRAST:  54mL OMNIPAQUE IOHEXOL 350 MG/ML SOLN COMPARISON:  Chest radiograph of earlier today chest CT 11/16/2022 FINDINGS: Cardiovascular: The quality of this exam for evaluation of pulmonary embolism is good. No evidence of  pulmonary embolism. Aortic atherosclerosis. Tortuous thoracic aorta. Mild cardiomegaly, without pericardial effusion. Median sternotomy for CABG. Mediastinum/Nodes: Low right paratracheal node measures 1.3 cm on 56/5 versus 1.0 cm on the prior. Right hilar 1.3 cm node on 67/5. Tiny hiatal hernia. Lungs/Pleura: Small bilateral pleural effusions. Mild centrilobular and paraseptal emphysema. Mild compressive bibasilar atelectasis. Upper Abdomen: Trace perihepatic ascites. Normal imaged portions of the spleen, adrenal glands. An upper pole right renal 3.0 cm lesion on 150/5 has indeterminate density measurements and is enlarged from 2.2 cm back on 12/16/2018. Musculoskeletal: No acute osseous abnormality. Review of the MIP images confirms the above findings. IMPRESSION: 1.  No evidence of pulmonary embolism. 2. Bilateral pleural effusions and small volume perihepatic ascites, favoring fluid overload. 3. Aortic atherosclerosis (ICD10-I70.0) and emphysema (ICD10-J43.9). No other explanation for shortness of breath. 4. Borderline to mild thoracic adenopathy, most likely reactive and can be seen in the setting of fluid overload/congestive heart failure. 5. 3.0 cm upper pole right renal lesion has indeterminate density measurements and has enlarged compared to an abdominal CT of 12/16/2018. Cannot exclude indolent renal cell carcinoma. Consider further evaluation with nonemergent, outpatient  pre and post-contrast MRI or CT. MRI is in general preferred but may be challenging if the patient's shortness of breath is chronic. 6.  Tiny hiatal hernia. Electronically Signed   By: Abigail Miyamoto M.D.   On: 01/20/2023 16:05   DG Chest Port 1 View  Result Date: 01/20/2023 CLINICAL DATA:  Shortness of breath EXAM: PORTABLE CHEST 1 VIEW COMPARISON:  Chest x-ray dated September 18, 2022 FINDINGS: Cardiac and mediastinal contours are unchanged status post median sternotomy. Mild diffuse interstitial opacities, similar to prior. Trace  bilateral pleural effusions. No evidence of pneumothorax. IMPRESSION: 1. Mild diffuse interstitial opacities, likely due to pulmonary edema. 2. Trace bilateral pleural effusions. Electronically Signed   By: Yetta Glassman M.D.   On: 01/20/2023 14:28     Scheduled Meds:  amiodarone  100 mg Oral Daily   amLODipine  10 mg Oral Daily   apixaban  5 mg Oral BID   furosemide  60 mg Intravenous Q12H   hydrALAZINE  50 mg Oral TID   insulin aspart  0-15 Units Subcutaneous TID WC   insulin detemir  30 Units Subcutaneous QHS   isosorbide mononitrate  30 mg Oral Daily   pantoprazole  40 mg Oral Daily   potassium chloride  40 mEq Oral BID   rosuvastatin  20 mg Oral Daily   Continuous Infusions:   LOS: 1 day    Time spent: 67min    Domenic Polite, MD [facility]   01/21/2023, 5:45 PM

## 2023-01-22 DIAGNOSIS — I5043 Acute on chronic combined systolic (congestive) and diastolic (congestive) heart failure: Secondary | ICD-10-CM

## 2023-01-22 DIAGNOSIS — I10 Essential (primary) hypertension: Secondary | ICD-10-CM | POA: Diagnosis not present

## 2023-01-22 DIAGNOSIS — I5041 Acute combined systolic (congestive) and diastolic (congestive) heart failure: Secondary | ICD-10-CM | POA: Diagnosis not present

## 2023-01-22 LAB — GLUCOSE, CAPILLARY
Glucose-Capillary: 163 mg/dL — ABNORMAL HIGH (ref 70–99)
Glucose-Capillary: 175 mg/dL — ABNORMAL HIGH (ref 70–99)
Glucose-Capillary: 263 mg/dL — ABNORMAL HIGH (ref 70–99)
Glucose-Capillary: 323 mg/dL — ABNORMAL HIGH (ref 70–99)
Glucose-Capillary: 343 mg/dL — ABNORMAL HIGH (ref 70–99)
Glucose-Capillary: 349 mg/dL — ABNORMAL HIGH (ref 70–99)

## 2023-01-22 LAB — BASIC METABOLIC PANEL
Anion gap: 6 (ref 5–15)
BUN: 57 mg/dL — ABNORMAL HIGH (ref 8–23)
CO2: 24 mmol/L (ref 22–32)
Calcium: 8.7 mg/dL — ABNORMAL LOW (ref 8.9–10.3)
Chloride: 102 mmol/L (ref 98–111)
Creatinine, Ser: 2.56 mg/dL — ABNORMAL HIGH (ref 0.61–1.24)
GFR, Estimated: 25 mL/min — ABNORMAL LOW (ref >60)
Glucose, Bld: 260 mg/dL — ABNORMAL HIGH (ref 70–99)
Potassium: 4.9 mmol/L (ref 3.5–5.1)
Sodium: 132 mmol/L — ABNORMAL LOW (ref 135–145)

## 2023-01-22 LAB — MAGNESIUM: Magnesium: 2.5 mg/dL — ABNORMAL HIGH (ref 1.7–2.4)

## 2023-01-22 MED ORDER — INSULIN ASPART 100 UNIT/ML IJ SOLN
15.0000 [IU] | Freq: Once | INTRAMUSCULAR | Status: AC
  Administered 2023-01-22: 15 [IU] via SUBCUTANEOUS

## 2023-01-22 MED ORDER — UMECLIDINIUM BROMIDE 62.5 MCG/ACT IN AEPB
1.0000 | INHALATION_SPRAY | Freq: Every day | RESPIRATORY_TRACT | Status: DC
  Filled 2023-01-22: qty 7

## 2023-01-22 MED ORDER — UMECLIDINIUM BROMIDE 62.5 MCG/ACT IN AEPB
1.0000 | INHALATION_SPRAY | Freq: Every day | RESPIRATORY_TRACT | Status: DC
  Administered 2023-01-23 – 2023-01-24 (×2): 1 via RESPIRATORY_TRACT
  Filled 2023-01-22: qty 7

## 2023-01-22 NOTE — Consult Note (Signed)
Advanced Heart Failure Team Consult Note   Primary Physician: [name], [name], MD PCP-Cardiologist:  Mertie Moores, MD  Reason for Consultation: Acute on chronic combined systolic and diastolic CHF  HPI:    [name] is seen today for evaluation of acute on chronic combined systolic and diastolic CHF at the request of Dr. Broadus [name], hospital medicine.   Mr. Sum is a 77 y.o. male with CAD c/p CABG 123456, chronic diastolic CHF, chronic DOE, Rt CEA >12yrs ago, Lt CEA 14', HTN, DM2, HLD, PAD w/ Rt SFA and Rt politeal artery vascular stents 15', prostate cancer.    Admitted [id number] with a/c systolic heart failure, newly reduced EF. Echo EF 30-35%, mild concentric LVH, GIIDD, RV function mildly reduced, mild MR, mild to mod TR, mild aortic valve regurg. Diuresed with IV lasix. Underwent RHC showing low filling pressures and preserved CO. GDMT started with Farxiga and Lasix. Other GDMT held with AKI. He was discharged home with oxygen, weight 172 lbs.   He was seen in the HF clinic 10/07/22. EKG showed new aflutter. Started on eliquis and placed on amiodarone. Plan for TEE/DC-CV 10/22/22. 10/22/22 S/P TEE/DC-CV--> EF 30-35% conversion to SR  Mr. Yori presented to the ED 3/26 with dyspnea, fatigue, BLE edema and orthopnea that began Monday. He had PFTs done 3/22 and since then he has felt "worn out" and he can't catch his breath. Labs significant for BNP 1939, HsTrop 714>547, SCr 1.98, K 3.6, CXR: Mild diffuse interstitial opacities, likely due to pulmonary edema. Trace bilateral pleural effusions.   Since admitted he has diuresed well with IV lasix. Put out over 6L UOP last 2 days. Weight stable, close to baseline. Denies CP. Significant conversational dyspnea with pursed lip breathing. Appetite stable.    Cardiac studies reviewed:  Echo 01/02/23 EF 35-40% mild AS/mod AI mild MR 10/22/22 S/P TEE/DC-CV--> EF 30-35% conversion to SR. [place] 11/23   RA = 8 RV = 48/10 PA = 45/20 (34) PCW =  13 Fick cardiac output/index = 6.0/3.0 Thermo CO/CI = 4.8/2.4  PVR = 3.3 WU (Fick) Ao sat = 90% PA sat = 55%, 58%  TEE 10/22/22 EF 30-35%   Echo (11/23): EF 30-35%, mild concentric LVH, GIIDD, RV function mildly reduced, mild MR, mild to mod TR, mild aortic valve regurg Echo (9/22): EF 60-65%, normal RV function, LA mildly elevated, trivial MR/TR, mild-mod aortic valve regurg   Home Medications Prior to Admission medications   Medication Sig Start Date End Date Taking? Authorizing Provider  albuterol (VENTOLIN HFA) 108 (90 Base) MCG/ACT inhaler Inhale 2 puffs into the lungs every 6 (six) hours as needed for wheezing or shortness of breath. 07/02/21  Yes [provider]  amiodarone (PACERONE) 200 MG tablet Take 0.5 tablets (100 mg total) by mouth daily. 11/07/22  Yes Clegg, [name] D, NP  amLODipine (NORVASC) 10 MG tablet Take 1 tablet (10 mg total) by mouth daily. 01/02/23  Yes Bensimhon, Shaune Pascal, MD  apixaban (ELIQUIS) 5 MG TABS tablet Take 1 tablet (5 mg total) by mouth 2 (two) times daily. 10/07/22  Yes [place]ford, Maricela Bo, FNP  Cholecalciferol (VITAMIN D-3) 125 MCG (5000 UT) TABS Take 5,000 Units by mouth daily.   Yes [provider]  cyanocobalamin (VITAMIN B12) 1000 MCG tablet Take 1,000 mcg by mouth daily.   Yes [provider]  dapagliflozin propanediol (FARXIGA) 10 MG TABS tablet Take 10 mg by mouth daily.   Yes [provider]  ferrous sulfate 325 (65  FE) MG tablet Take 325 mg by mouth daily with breakfast.   Yes [provider]  furosemide (LASIX) 40 MG tablet Take 40 mg by mouth daily as needed for edema.   Yes [provider]  hydrALAZINE (APRESOLINE) 50 MG tablet Take 1 tablet (50 mg total) by mouth 3 (three) times daily. 12/04/22  Yes Bensimhon, Shaune Pascal, MD  insulin NPH Human (NOVOLIN N) 100 UNIT/ML injection Inject 35-40 Units into the skin See admin instructions. Inject 40 units into the skin in the morning and 35 units at night  09/08/19  Yes [provider]  insulin regular (NOVOLIN R) 100 units/mL injection Inject 6-15 Units into the skin 3 (three) times daily before meals. Per sliding scale 09/08/19  Yes [provider]  isosorbide mononitrate (IMDUR) 30 MG 24 hr tablet Take 1 tablet (30 mg total) by mouth daily. 12/04/22  Yes Bensimhon, Shaune Pascal, MD  pantoprazole (PROTONIX) 40 MG tablet Take 40 mg by mouth daily.   Yes [provider]  rosuvastatin (CRESTOR) 20 MG tablet Take 1 tablet by mouth once daily 08/18/22  Yes Nahser, Wonda Cheng, MD  nitroGLYCERIN (NITROSTAT) 0.4 MG SL tablet Place 1 tablet (0.4 mg total) under the tongue every 5 (five) minutes as needed for chest pain. 08/31/20   Nahser, Wonda Cheng, MD    Past Medical History: Past Medical History:  Diagnosis Date   Arthritis    CAD (coronary artery disease)    Carotid artery occlusion    Diabetes mellitus    fasting 100-120s   Dizziness    Dysrhythmia    skips beats   GERD (gastroesophageal reflux disease)    History of kidney stones    Hyperlipidemia    Hypertension    IHD (ischemic heart disease)    Prior CABG in 2003   Normal nuclear stress test June 2012   Peripheral vascular disease ([place])    Prostate cancer ([place])    Torn rotator cuff     Past Surgical History: Past Surgical History:  Procedure Laterality Date   ABDOMINAL AORTAGRAM N/A 12/21/2013   Procedure: ABDOMINAL Maxcine Ham;  Surgeon: Elam Dutch, MD;  Location: [facility] CATH LAB;  Service: Cardiovascular;  Laterality: N/A;   ABDOMINAL AORTOGRAM N/A 11/13/2017   Procedure: ABDOMINAL AORTOGRAM;  Surgeon: Elam Dutch, MD;  Location: [place] CV LAB;  Service: Cardiovascular;  Laterality: N/A;   APPENDECTOMY     ARCH AORTOGRAM  08/26/2013   Procedure: ARCH AORTOGRAM;  Surgeon: Elam Dutch, MD;  Location: [facility] CATH LAB;  Service: Cardiovascular;;   CARDIAC CATHETERIZATION  05/03/2002   EF 40-45%   CARDIOVASCULAR STRESS TEST  08/10/2006   EF 65%    CARDIOVERSION N/A 10/22/2022   Procedure: CARDIOVERSION;  Surgeon: Jolaine Artist, MD;  Location: [facility];  Service: Cardiovascular;  Laterality: N/A;   CAROTID ENDARTERECTOMY     CAROTID STENT INSERTION Left 08/26/2013   Procedure: CAROTID STENT INSERTION;  Surgeon: Elam Dutch, MD;  Location: [facility] CATH LAB;  Service: Cardiovascular;  Laterality: Left;  internal carotid   CORONARY ARTERY BYPASS GRAFT  04/2002   DOPPLER ECHOCARDIOGRAPHY  08/12/2002   EF 80-85%   ENDARTERECTOMY Left 07/20/2013   Procedure: ATTEMPTED ENDARTERECTOMY CAROTID;  Surgeon: Conrad Thornton, MD;  Location: [place];  Service: Vascular;  Laterality: Left;   HERNIA REPAIR     LOWER EXTREMITY ANGIOGRAPHY  11/13/2017   Procedure: Lower Extremity Angiography;  Surgeon: Elam Dutch, MD;  Location: [facility] INVASIVE CV  LAB;  Service: Cardiovascular;;   PERIPHERAL VASCULAR INTERVENTION Right 11/13/2017   Procedure: PERIPHERAL VASCULAR INTERVENTION;  Surgeon: Elam Dutch, MD;  Location: [place] CV LAB;  Service: Cardiovascular;  Laterality: Right;  superficial femoral   RIGHT HEART CATH N/A 09/23/2022   Procedure: RIGHT HEART CATH;  Surgeon: Jolaine Artist, MD;  Location: [place] CV LAB;  Service: Cardiovascular;  Laterality: N/A;   SHOULDER SURGERY     TEE WITHOUT CARDIOVERSION N/A 10/22/2022   Procedure: TRANSESOPHAGEAL ECHOCARDIOGRAM (TEE);  Surgeon: Jolaine Artist, MD;  Location: [facility] ENDOSCOPY;  Service: Cardiovascular;  Laterality: N/A;    Family History: Family History  Problem Relation Age of Onset   Heart attack Mother    Diabetes Mother    Heart disease Mother    Hypertension Mother    Heart attack Father    Heart disease Father    Hypertension Father    Diabetes Brother    Diabetes Sister    Diabetes Daughter    Heart disease Daughter    Hypertension Daughter    Diabetes Sister    Breast cancer Neg Hx    Prostate cancer Neg Hx    Colon cancer Neg Hx    Pancreatic cancer Neg  Hx     Social History: Social History   Socioeconomic History   Marital status: Married    Spouse name: Not on file   Number of children: 2   Years of education: Not on file   Highest education level: Not on file  Occupational History   Not on file  Tobacco Use   Smoking status: Former    Packs/day: 1.00    Years: 20.00    Additional pack years: 0.00    Total pack years: 20.00    Types: Cigarettes    Quit date: 10/27/2001    Years since quitting: 21.2    Passive exposure: Never   Smokeless tobacco: Never  Vaping Use   Vaping Use: Never used  Substance and Sexual Activity   Alcohol use: No    Alcohol/week: 0.0 standard drinks of alcohol   Drug use: No   Sexual activity: Not Currently  Other Topics Concern   Not on file  Social History Narrative   Not on file   Social Determinants of Health   Financial Resource Strain: Not on file  Food Insecurity: No Food Insecurity (01/21/2023)   Hunger Vital Sign    Worried About Running Out of Food in the Last Year: Never true    Ran Out of Food in the Last Year: Never true  Transportation Needs: No Transportation Needs (01/21/2023)   PRAPARE - Hydrologist (Medical): No    Lack of Transportation (Non-Medical): No  Physical Activity: Not on file  Stress: Not on file  Social Connections: Not on file    Allergies:  Allergies  Allergen Reactions   Aldactone [Spironolactone] Other (See Comments)    Swollen breast   Lipitor [Atorvastatin Calcium] Other (See Comments)    Muscle ache   Sulfa Drugs Cross Reactors Other (See Comments)    "raw spots"   Sulfamethoxazole-Trimethoprim     Other reaction(s): Rash   Codeine Rash    Objective:    Vital Signs:   Temp:  [97.5 F (36.4 C)-98.1 F (36.7 C)] 97.5 F (36.4 C) (03/28 0757) Pulse Rate:  [59-70] 70 (03/28 0757) Resp:  [20] 20 (03/28 0757) BP: (111-149)/(47-64) 141/64 (03/28 0757) SpO2:  [90 %-96 %] 94 % (  03/28 0757) Weight:  [82.2 kg]  82.2 kg (03/28 0131) Last BM Date :  (PTA)  Weight change: Filed Weights   01/21/23 0015 01/22/23 0131  Weight: 81.7 kg 82.2 kg    Intake/Output:   Intake/Output Summary (Last 24 hours) at 01/22/2023 1131 Last data filed at 01/22/2023 1000 Gross per 24 hour  Intake 477 ml  Output 3100 ml  Net -2623 ml    Physical Exam  General:  elderly appearing. conversational dyspnea and pursed lip breathing HEENT: normal Neck: supple. JVD ~10 cm. Carotids 2+ bilat; no bruits. No lymphadenopathy or thyromegaly appreciated. Cor: PMI nondisplaced. Regular rate & rhythm. No rubs, gallops or murmurs. Lungs: diminished Abdomen: soft, nontender, nondistended. No hepatosplenomegaly. No bruits or masses. Good bowel sounds. Extremities: no cyanosis, clubbing, rash, trace BLE edema  Neuro: alert & oriented x 3, cranial nerves grossly intact. moves all 4 extremities w/o difficulty. Affect pleasant.  Telemetry   NSR 80s (Personally reviewed)    EKG    NSR, 1st degree AVB. RBBB 87 bpm   Labs   Basic Metabolic Panel: Recent Labs  Lab 01/20/23 1357 01/20/23 1452 01/21/23 0047 01/22/23 0039  NA 137 139 134* 132*  K 3.6 3.3* 4.3 4.9  CL 107  --  105 102  CO2 18*  --  18* 24  GLUCOSE 162*  --  205* 260*  BUN 52*  --  45* 57*  CREATININE 1.98*  --  2.00* 2.56*  CALCIUM 9.8  --  8.6* 8.7*  MG  --   --  2.5* 2.5*    Liver Function Tests: No results for input(s): "AST", "ALT", "ALKPHOS", "BILITOT", "PROT", "ALBUMIN" in the last 168 hours. No results for input(s): "LIPASE", "AMYLASE" in the last 168 hours. No results for input(s): "AMMONIA" in the last 168 hours.  CBC: Recent Labs  Lab 01/20/23 1357 01/20/23 1452  WBC 9.6  --   NEUTROABS 7.8*  --   HGB 13.4 11.6*  HCT 44.1 34.0*  MCV 87.8  --   PLT 252  --    Cardiac Enzymes: No results for input(s): "CKTOTAL", "CKMB", "CKMBINDEX", "TROPONINI" in the last 168 hours.  BNP: BNP (last 3 results) Recent Labs    09/20/22 1129  10/16/22 1409 01/20/23 1357  BNP 1,408.9* 528.1* 1,939.2*   ProBNP (last 3 results) No results for input(s): "PROBNP" in the last 8760 hours.  CBG: Recent Labs  Lab 01/21/23 1131 01/21/23 1551 01/21/23 2113 01/22/23 0609 01/22/23 1115  GLUCAP 254* 252* 233* 163* 175*   Coagulation Studies: No results for input(s): "LABPROT", "INR" in the last 72 hours.  Imaging   No results found.  Medications:   Current Medications:  amiodarone  100 mg Oral Daily   amLODipine  10 mg Oral Daily   apixaban  5 mg Oral BID   hydrALAZINE  50 mg Oral TID   insulin aspart  0-15 Units Subcutaneous TID WC   insulin detemir  15 Units Subcutaneous QHS   isosorbide mononitrate  30 mg Oral Daily   pantoprazole  40 mg Oral Daily    Infusions:   Patient Profile   Mr. Waldner is a 77 y.o. male with CAD c/p CABG 123456, chronic diastolic CHF, chronic DOE, Rt CEA >57yrs ago, Lt CEA 14', HTN, DM2, HLD, PAD w/ Rt SFA and Rt politeal artery vascular stents 15', prostate cancer.   Assessment/Plan  Acute on chronic combined systolic and diastolic CHF, previously HFpEF now HFrEF  - Echo 01/02/23 EF 35-40% mild  AS/mod AI mild MR - NYHA IV on admission, BNP 1939 - volume slightly elevated, has diuresed well with IV lasix. Weight around baseline. Got 60 IV lasix this morning. No further diuretics today  - hold Farxiga 10 mg daily with AKI.  - Continue hydral/Imdur (50/30) - No MRA or Acei/ARNi with hyperkalemia.  - Off BB with recent acute exacerbation - Initially felt that reduction in EF due to AF but echo c/w inferior/inferolateral MI. Suspect probable SVG failure. Given CKD and lack of angina will not pursue cath at this time  - Strict I&O, daily weights  HTN - on hydral/ imdur. - SBP in 140s - Continue amlodipine 10 mg daily - Continue hydral 50 mg TID - avoid hypotension  AKI on CKD stage 3b - SCr 2.56 today, baseline (1.5-2) - SCr 2>2.56 overnight - contrast induced? - avoid  hypotension  Atrial flutter - 10/22/22 TEE/DC-CV ---> SR  - remains in NSR - Continue amiodarone 100 mg daily - Continue Eliquis 5 mg daily  CAD s/p CABG - CABG 2003 - HsTrop elevated on admission, demand ischemia? Recent echo c/w inferior/inferolateral MI. Plan for medical management unless he develops angina - no chest pain on exam - Continue statin? (Stopped yesterday), eliquis  COPD - follows at [facility] - on home O2 - Sats in high 80s low 90s on 3L [place] - has appt for f/u Monday - PFTs completed 3/22: Mild restriction, moderately reduced diffusing capacity  7. PAD - s/p intervention 2015 Rt SFA and Rt politeal artery vascular stents - Denies claudication.  8.  DM 2 - Hgb A1c 7.0 - SGLT2i on hold with AKI  -SSI per primary  9. Carotid artery stenosis - S/p b/l CEA - Continue statin.  56. GOC - had lengthy discussion with patient about his wishes today as he expressed being "done" and "close to the end" - suggested palliative care, he declined for now - to remain full code for now, wants to discuss with family.   Length of Stay: [place], NP  01/22/2023, 11:31 AM  Advanced Heart Failure Team Pager [phone] (M-F; 7a - 5p)  Please contact [facility] for night-coverage after hours (4p -7a ) and weekends on [email or web address]

## 2023-01-22 NOTE — Care Management Important Message (Signed)
Important Message  Patient Details  Name: [name] MRN: [id number] Date of Birth: [birth year]   Medicare Important Message Given:  Yes     Orbie Pyo 01/22/2023, 3:05 PM

## 2023-01-22 NOTE — Progress Notes (Signed)
Pt called out stating he was feeling "off" and states he feels this way when his blood sugar is over 300. Checked a CBG - 323. Spoke to Dr. Broadus [name] who agreed to give bedtime levemir early.

## 2023-01-23 DIAGNOSIS — I5041 Acute combined systolic (congestive) and diastolic (congestive) heart failure: Secondary | ICD-10-CM | POA: Diagnosis not present

## 2023-01-23 DIAGNOSIS — I10 Essential (primary) hypertension: Secondary | ICD-10-CM | POA: Diagnosis not present

## 2023-01-23 DIAGNOSIS — I5043 Acute on chronic combined systolic (congestive) and diastolic (congestive) heart failure: Secondary | ICD-10-CM | POA: Diagnosis not present

## 2023-01-23 LAB — BASIC METABOLIC PANEL
Anion gap: 12 (ref 5–15)
Anion gap: 12 (ref 5–15)
BUN: 59 mg/dL — ABNORMAL HIGH (ref 8–23)
BUN: 59 mg/dL — ABNORMAL HIGH (ref 8–23)
CO2: 18 mmol/L — ABNORMAL LOW (ref 22–32)
CO2: 18 mmol/L — ABNORMAL LOW (ref 22–32)
Calcium: 9.1 mg/dL (ref 8.9–10.3)
Calcium: 8.9 mg/dL (ref 8.9–10.3)
Chloride: 103 mmol/L (ref 98–111)
Chloride: 102 mmol/L (ref 98–111)
Creatinine, Ser: 2.59 mg/dL — ABNORMAL HIGH (ref 0.61–1.24)
Creatinine, Ser: 2.25 mg/dL — ABNORMAL HIGH (ref 0.61–1.24)
GFR, Estimated: 25 mL/min — ABNORMAL LOW (ref >60)
GFR, Estimated: 30 mL/min — ABNORMAL LOW (ref >60)
Glucose, Bld: 234 mg/dL — ABNORMAL HIGH (ref 70–99)
Glucose, Bld: 350 mg/dL — ABNORMAL HIGH (ref 70–99)
Potassium: 5.2 mmol/L — ABNORMAL HIGH (ref 3.5–5.1)
Potassium: 5.3 mmol/L — ABNORMAL HIGH (ref 3.5–5.1)
Sodium: 133 mmol/L — ABNORMAL LOW (ref 135–145)
Sodium: 132 mmol/L — ABNORMAL LOW (ref 135–145)

## 2023-01-23 LAB — GLUCOSE, CAPILLARY
Glucose-Capillary: 212 mg/dL — ABNORMAL HIGH (ref 70–99)
Glucose-Capillary: 269 mg/dL — ABNORMAL HIGH (ref 70–99)
Glucose-Capillary: 273 mg/dL — ABNORMAL HIGH (ref 70–99)
Glucose-Capillary: 221 mg/dL — ABNORMAL HIGH (ref 70–99)

## 2023-01-23 MED ORDER — INSULIN DETEMIR 100 UNIT/ML ~~LOC~~ SOLN
25.0000 [IU] | Freq: Every day | SUBCUTANEOUS | Status: DC
  Administered 2023-01-23: 25 [IU] via SUBCUTANEOUS
  Filled 2023-01-23 (×2): qty 0.25

## 2023-01-23 MED ORDER — HYDRALAZINE HCL 25 MG PO TABS
25.0000 mg | ORAL_TABLET | Freq: Three times a day (TID) | ORAL | Status: DC
  Administered 2023-01-23 – 2023-01-24 (×4): 25 mg via ORAL
  Filled 2023-01-23 (×4): qty 1

## 2023-01-23 MED ORDER — SODIUM ZIRCONIUM CYCLOSILICATE 5 G PO PACK
5.0000 g | PACK | Freq: Once | ORAL | Status: AC
  Administered 2023-01-23: 5 g via ORAL
  Filled 2023-01-23: qty 1

## 2023-01-23 MED ORDER — AMLODIPINE BESYLATE 10 MG PO TABS
10.0000 mg | ORAL_TABLET | Freq: Every day | ORAL | Status: DC
  Administered 2023-01-23 – 2023-01-24 (×2): 10 mg via ORAL
  Filled 2023-01-23 (×2): qty 1

## 2023-01-23 MED ORDER — MAGNESIUM HYDROXIDE 400 MG/5ML PO SUSP
15.0000 mL | Freq: Once | ORAL | Status: AC
  Administered 2023-01-23: 15 mL via ORAL
  Filled 2023-01-23: qty 30

## 2023-01-23 MED ORDER — INSULIN ASPART 100 UNIT/ML IJ SOLN
3.0000 [IU] | Freq: Three times a day (TID) | INTRAMUSCULAR | Status: DC
  Administered 2023-01-23 (×2): 3 [IU] via SUBCUTANEOUS

## 2023-01-23 NOTE — Progress Notes (Signed)
Mobility Specialist Progress Note:   01/23/23 1150  Mobility  Activity Ambulated with assistance in hallway  Level of Assistance Standby assist, set-up cues, supervision of patient - no hands on  Assistive Device None  Distance Ambulated (ft) 350 ft  Activity Response Tolerated well  Mobility Referral Yes  $Mobility charge 1 Mobility   Pt received ambulating back from BR on RA. SpO2 84%, recovered with 2LO2. Pt ambulated in hallway at supervision level. 2-3LO2 required to maintain SpO2 WFL. Pt left sitting EOB with all needs met.   Nelta Numbers Mobility Specialist Please contact via SecureChat or  Rehab office at [phone]

## 2023-01-23 NOTE — Progress Notes (Signed)
Advanced Heart Failure Rounding Note  PCP-Cardiologist: Mertie Moores, MD   Subjective:    Feels ok. Wants to go home. No chest pain.   Objective:   Weight Range: 82 kg Body mass index is 25.94 kg/m.   Vital Signs:   Temp:  [97 F (36.1 C)-98.3 F (36.8 C)] 98 F (36.7 C) (03/29 0803) Pulse Rate:  [71-109] 77 (03/29 0803) Resp:  [18-20] 18 (03/29 0803) BP: (122-150)/(53-72) 140/54 (03/29 0803) SpO2:  [91 %-96 %] 93 % (03/29 0803) Weight:  [82 kg] 82 kg (03/29 0156) Last BM Date :  (PTA)  Weight change: Filed Weights   01/21/23 0015 01/22/23 0131 01/23/23 0156  Weight: 81.7 kg 82.2 kg 82 kg    Intake/Output:   Intake/Output Summary (Last 24 hours) at 01/23/2023 [id number] Last data filed at 01/23/2023 0600 Gross per 24 hour  Intake --  Output 2400 ml  Net -2400 ml      Physical Exam    General:  Sitting on the side of the bed.  No resp difficulty HEENT: Normal Neck: Supple. JVP 6-7 . Carotids 2+ bilat; no bruits. No lymphadenopathy or thyromegaly appreciated. Cor: PMI nondisplaced. Regular rate & rhythm. No rubs, gallops or murmurs. Lungs: Clear Abdomen: Soft, nontender, nondistended. No hepatosplenomegaly. No bruits or masses. Good bowel sounds. Extremities: No cyanosis, clubbing, rash, edema Neuro: Alert & orientedx3, cranial nerves grossly intact. moves all 4 extremities w/o difficulty. Affect pleasant   Telemetry   SR EKG    N/A  Labs    CBC Recent Labs    01/20/23 1357 01/20/23 1452  WBC 9.6  --   NEUTROABS 7.8*  --   HGB 13.4 11.6*  HCT 44.1 34.0*  MCV 87.8  --   PLT 252  --    Basic Metabolic Panel Recent Labs    01/21/23 0047 01/22/23 0039 01/23/23 0011  NA 134* 132* 133*  K 4.3 4.9 5.2*  CL 105 102 103  CO2 18* 24 18*  GLUCOSE 205* 260* 234*  BUN 45* 57* 59*  CREATININE 2.00* 2.56* 2.59*  CALCIUM 8.6* 8.7* 9.1  MG 2.5* 2.5*  --    Liver Function Tests No results for input(s): "AST", "ALT", "ALKPHOS", "BILITOT", "PROT",  "ALBUMIN" in the last 72 hours. No results for input(s): "LIPASE", "AMYLASE" in the last 72 hours. Cardiac Enzymes No results for input(s): "CKTOTAL", "CKMB", "CKMBINDEX", "TROPONINI" in the last 72 hours.  BNP: BNP (last 3 results) Recent Labs    09/20/22 1129 10/16/22 1409 01/20/23 1357  BNP 1,408.9* 528.1* 1,939.2*    ProBNP (last 3 results) No results for input(s): "PROBNP" in the last 8760 hours.   D-Dimer No results for input(s): "DDIMER" in the last 72 hours. Hemoglobin A1C No results for input(s): "HGBA1C" in the last 72 hours. Fasting Lipid Panel No results for input(s): "CHOL", "HDL", "LDLCALC", "TRIG", "CHOLHDL", "LDLDIRECT" in the last 72 hours. Thyroid Function Tests No results for input(s): "TSH", "T4TOTAL", "T3FREE", "THYROIDAB" in the last 72 hours.  Invalid input(s): "FREET3"  Other results:   Imaging    No results found.   Medications:     Scheduled Medications:  amiodarone  100 mg Oral Daily   apixaban  5 mg Oral BID   hydrALAZINE  25 mg Oral TID   insulin aspart  0-15 Units Subcutaneous TID WC   insulin detemir  15 Units Subcutaneous QHS   isosorbide mononitrate  30 mg Oral Daily   pantoprazole  40 mg Oral Daily  umeclidinium bromide  1 puff Inhalation Daily    Infusions:   PRN Medications: acetaminophen **OR** acetaminophen, albuterol, ondansetron **OR** ondansetron (ZOFRAN) IV, oxyCODONE, senna-docusate    Patient Profile   77 YO WM w/ hx of ischemic cardiomyopathy (CABG in 2003), HFrEF with LVEF of 35% currently admitted with acute on chronic HFrEF exacerbation.   Assessment/Plan   Acute on chronic combined systolic and diastolic CHF, previously HFpEF now HFrEF  - Echo 01/02/23 EF 35-40% mild AS/mod AI mild MR - NYHA IV on admission, BNP 1939 - Volume status stable.  - Continue to hold Farxiga 10 mg daily with AKI.  - Continue hydral/Imdur - No MRA or Acei/ARNi with hyperkalemia.  Last dose of potassium 3/28.  - Off BB  with recent acute exacerbation - Initially felt that reduction in EF due to AF but echo c/w inferior/inferolateral MI. Suspect probable SVG failure. Given CKD and lack of angina will not pursue cath at this time  - Strict I&O, daily weights   HTN -  Stable.  - Continue hydral 25 mg tid + imdur 30 mg daily.    AKI on CKD stage 3b - SCr 2.56 today, baseline (1.5-2) - SCr 2>2.56 >2.6 - Renal US- cyst otherwise unremarkable.  - contrast induced? - avoid hypotension   Atrial flutter - 10/22/22 TEE/DC-CV ---> SR  - remains in NSR - Continue amiodarone 100 mg daily - Continue Eliquis 5 mg daily   CAD s/p CABG - CABG 2003 - HsTrop elevated on admission, demand ischemia? Recent echo c/w inferior/inferolateral MI. Plan for medical management unless he develops angina and AKI.   -No chest pain. - Continue statin, eliquis   COPD - follows at [facility] - on home O2 -  PFTs completed 3/22: Mild restriction, moderately reduced diffusing capacity - Sats stable on 3 liters    7. PAD - s/p intervention 2015 Rt SFA and Rt politeal artery vascular stents - Denies claudication.   8.  DM 2 - Hgb A1c 7.0 - SGLT2i on hold with AKI  -SSI per primary   9. Carotid artery stenosis - S/p b/l CEA - Continue statin.   36. GOC - had lengthy discussion with patient about his wishes today as he expressed being "done" and "close to the end" - suggested palliative care, he declined for now - to remain full code for now, wants to discuss with family.    Length of Stay: 2   Length of Stay: 3  Connor Kuhnle, NP  01/23/2023, 8:12 AM  Advanced Heart Failure Team Pager [phone] (M-F; 7a - 5p)  Please contact [facility] for night-coverage after hours (5p -7a ) and weekends on [email or web address]

## 2023-01-23 NOTE — Progress Notes (Signed)
PROGRESS NOTE    [name]  [id number] DOB: 1946/10/20 DOA: 01/20/2023 PCP: Haywood Pao, MD  76/M w CAD, type 2 diabetes, COPD/resp failure on 2-3L Home O2, GERD, hypertension, hyperlipidemia, prior CABG CHF, COPD presented to the ED w shortness of breath. 3/26 developed acute onset shortness of breath w/orthopnea and paroxysmal nocturnal dyspnea.  In ED,tachypneic with increased work of breathing.  Labs creatinine 1.9, BNP 1939, troponin 714, 547.  CXR w/  diffuse interstitial opacities likely pulmonary edema and bilateral pleural effusions.  CTA no evidence of PE however bilateral pleural effusions consistent with volume overload.  There was also a 3 cm right renal lesion enlarged compared to prior with recommended outpatient follow-up.  -placed on high flow nasal cannula, IV Lasix   Subjective: Feels better overall, breathing close to baseline  Assessment and Plan:  Acute on chronic hypoxic respiratory failure  ACute on chronic systolic CHF -last ECHO 3/8 which demonstrated EF 35 to 40%.  There was elevated pulmonary artery systolic pressure as well. -Improving with diuretics, he is 8 L negative, creatinine trended up to 2.5 yesterday, further diuretics on hold -Continue hydralazine, Imdur, Farxiga on hold as well -CHF team following, BMP in a.m -Increase activity  AKI on CKD 3a -Baseline creatinine 1.5-2, now trended up to 2.5 yesterday -Diuresed aggressively and got contrast for CTA -Avoid hypotension, BMP in a.m.   Paroxysmal A-fib, atrial flutter -Now in sinus rhythm - Continue home amiodarone 100 mg daily and anticoagulation with Eliquis  CAD/CABG 2003 -Recent echo consistent with inferolateral MI, medical management recommended unless develops angina  COPD Chronic respiratory failure on home O2 prn -Usually 1 to 2 L  R Renal lesion -noted on CT, needs FU   Hypertension -continue amlodipine   Type 2 diabetes -patient uses 7030 at home.   -Increase  Levemir dose, add meal coverage   Hyperlipidemia-continue Crestor   GERD-continue Protonix  DVT prophylaxis: apixaban Code Status: Full code Family Communication: Daughter at site Disposition Plan: Home in 1 to 2 days  Consultants: Cards   Procedures:   Antimicrobials:    Objective: Vitals:   01/22/23 2100 01/23/23 0156 01/23/23 0500 01/23/23 0803  BP: (!) 150/54 137/64 (!) 122/53 (!) 140/54  Pulse: 72 77 71 77  Resp: 20 20 20 18   Temp: (!) 97.3 F (36.3 C) (!) 97 F (36.1 C) 98 F (36.7 C) 98 F (36.7 C)  TempSrc: Oral Oral Oral Oral  SpO2: 93% 96% 92% 93%  Weight:  82 kg    Height:        Intake/Output Summary (Last 24 hours) at 01/23/2023 1207 Last data filed at 01/23/2023 1100 Gross per 24 hour  Intake 360 ml  Output 1950 ml  Net -1590 ml   Filed Weights   01/21/23 0015 01/22/23 0131 01/23/23 0156  Weight: 81.7 kg 82.2 kg 82 kg    Examination: Chronically ill male sitting up in bed, AAOx3, no distress ENT: No JVD CVS: S1-S2, regular rhythm Lungs: Clear bilaterally Abdomen: Soft, nontender, bowel sounds present Extremities: Trace edema  Data Reviewed:   CBC: Recent Labs  Lab 01/20/23 1357 01/20/23 1452  WBC 9.6  --   NEUTROABS 7.8*  --   HGB 13.4 11.6*  HCT 44.1 34.0*  MCV 87.8  --   PLT 252  --    Basic Metabolic Panel: Recent Labs  Lab 01/20/23 1357 01/20/23 1452 01/21/23 0047 01/22/23 0039 01/23/23 0011  NA 137 139 134* 132* 133*  K 3.6  3.3* 4.3 4.9 5.2*  CL 107  --  105 102 103  CO2 18*  --  18* 24 18*  GLUCOSE 162*  --  205* 260* 234*  BUN 52*  --  45* 57* 59*  CREATININE 1.98*  --  2.00* 2.56* 2.59*  CALCIUM 9.8  --  8.6* 8.7* 9.1  MG  --   --  2.5* 2.5*  --    GFR: Estimated Creatinine Clearance: 25.4 mL/min (A) (by C-G formula based on SCr of 2.59 mg/dL (H)). Liver Function Tests: No results for input(s): "AST", "ALT", "ALKPHOS", "BILITOT", "PROT", "ALBUMIN" in the last 168 hours. No results for input(s): "LIPASE",  "AMYLASE" in the last 168 hours. No results for input(s): "AMMONIA" in the last 168 hours. Coagulation Profile: No results for input(s): "INR", "PROTIME" in the last 168 hours. Cardiac Enzymes: No results for input(s): "CKTOTAL", "CKMB", "CKMBINDEX", "TROPONINI" in the last 168 hours. BNP (last 3 results) No results for input(s): "PROBNP" in the last 8760 hours. HbA1C: No results for input(s): "HGBA1C" in the last 72 hours. CBG: Recent Labs  Lab 01/22/23 1845 01/22/23 2032 01/22/23 2105 01/23/23 0610 01/23/23 1128  GLUCAP 323* 343* 349* 212* 269*   Lipid Profile: No results for input(s): "CHOL", "HDL", "LDLCALC", "TRIG", "CHOLHDL", "LDLDIRECT" in the last 72 hours. Thyroid Function Tests: No results for input(s): "TSH", "T4TOTAL", "FREET4", "T3FREE", "THYROIDAB" in the last 72 hours. Anemia Panel: No results for input(s): "VITAMINB12", "FOLATE", "FERRITIN", "TIBC", "IRON", "RETICCTPCT" in the last 72 hours. Urine analysis:    Component Value Date/Time   COLORURINE STRAW (A) 10/19/2022 0628   APPEARANCEUR CLEAR 10/19/2022 0628   LABSPEC 1.009 10/19/2022 0628   PHURINE 5.0 10/19/2022 0628   GLUCOSEU >=500 (A) 10/19/2022 0628   HGBUR NEGATIVE 10/19/2022 0628   BILIRUBINUR NEGATIVE 10/19/2022 0628   KETONESUR NEGATIVE 10/19/2022 0628   PROTEINUR NEGATIVE 10/19/2022 0628   UROBILINOGEN 1.0 07/19/2013 1223   NITRITE NEGATIVE 10/19/2022 0628   LEUKOCYTESUR NEGATIVE 10/19/2022 0628   Sepsis Labs: @LABRCNTIP (procalcitonin:4,lacticidven:4)  )No results found for this or any previous visit (from the past 240 hour(s)).   Radiology Studies: No results found.   Scheduled Meds:  amiodarone  100 mg Oral Daily   apixaban  5 mg Oral BID   hydrALAZINE  25 mg Oral TID   insulin aspart  0-15 Units Subcutaneous TID WC   insulin detemir  15 Units Subcutaneous QHS   isosorbide mononitrate  30 mg Oral Daily   pantoprazole  40 mg Oral Daily   umeclidinium bromide  1 puff Inhalation  Daily   Continuous Infusions:   LOS: 3 days    Time spent: 56min    Domenic Polite, MD [facility]   01/23/2023, 12:07 PM

## 2023-01-23 NOTE — Progress Notes (Signed)
Inpatient Diabetes Program Recommendations  AACE/ADA: New Consensus Statement on Inpatient Glycemic Control (2015)  Target Ranges:  Prepandial:   less than 140 mg/dL      Peak postprandial:   less than 180 mg/dL (1-2 hours)      Critically ill patients:  140 - 180 mg/dL   Lab Results  Component Value Date   GLUCAP 212 (H) 01/23/2023   HGBA1C 7.0 (H) 09/20/2022    Review of Glycemic Control  Latest Reference Range & Units 01/22/23 06:09 01/22/23 11:15 01/22/23 16:49 01/22/23 18:45 01/22/23 20:32 01/22/23 21:05 01/23/23 06:10  Glucose-Capillary 70 - 99 mg/dL 163 (H) 175 (H) 263 (H) 323 (H) 343 (H) 349 (H) 212 (H)   Diabetes history: DM 2 Outpatient Diabetes medications:  Novolin N 40 unit q AM and 35 units q PM Novolin R 6-15 units tid with meals Current orders for Inpatient glycemic control:  Levemir 15 units q HS Novolog 0-15 units tid with meals  Inpatient Diabetes Program Recommendations:    -   May consider adding Novolog 4 units tid meal coverage if eating >50% of meals  Thanks,  Tama Headings RN, MSN, BC-ADM Inpatient Diabetes Coordinator Team Pager [phone] (8a-5p)

## 2023-01-23 NOTE — TOC Progression Note (Signed)
Transition of Care [facility]) - Progression Note    Patient Details  Name: [name] MRN: [id number] Date of Birth: Sep 14, 1946  Transition of Care [facility]) CM/SW Contact  Erenest Rasher, RN Phone Number: 01/23/2023, 3:41 PM  Clinical Narrative:     CM spoke to pt and has oxygen at home with [employer]. He has a portable to use at dc. Pt states he has scale at home for daily weights. Dtr to provide transportation home.   Expected Discharge Plan: Home/Self Care Barriers to Discharge: No Barriers Identified  Expected Discharge Plan and Services In-house Referral: NA Discharge Planning Services: CM Consult Post Acute Care Choice: NA Living arrangements for the past 2 months: Single Family Home                 DME Arranged: N/A DME Agency: NA       HH Arranged: NA           Social Determinants of Health (SDOH) Interventions SDOH Screenings   Food Insecurity: No Food Insecurity (01/21/2023)  Housing: Low Risk  (01/21/2023)  Transportation Needs: No Transportation Needs (01/21/2023)  Utilities: Not At Risk (01/21/2023)  Tobacco Use: Medium Risk (01/20/2023)    Readmission Risk Interventions    01/21/2023    4:21 PM  Readmission Risk Prevention Plan  Transportation Screening Complete  Medication Review ([place]) Complete  HRI or [place] Complete  [place] Not Applicable

## 2023-01-24 DIAGNOSIS — I5041 Acute combined systolic (congestive) and diastolic (congestive) heart failure: Secondary | ICD-10-CM | POA: Diagnosis not present

## 2023-01-24 LAB — GLUCOSE, CAPILLARY: Glucose-Capillary: 212 mg/dL — ABNORMAL HIGH (ref 70–99)

## 2023-01-24 LAB — BASIC METABOLIC PANEL
Anion gap: 13 (ref 5–15)
BUN: 69 mg/dL — ABNORMAL HIGH (ref 8–23)
CO2: 13 mmol/L — ABNORMAL LOW (ref 22–32)
Calcium: 9 mg/dL (ref 8.9–10.3)
Chloride: 105 mmol/L (ref 98–111)
Creatinine, Ser: 2.16 mg/dL — ABNORMAL HIGH (ref 0.61–1.24)
GFR, Estimated: 31 mL/min — ABNORMAL LOW (ref >60)
Glucose, Bld: 177 mg/dL — ABNORMAL HIGH (ref 70–99)
Potassium: 4.9 mmol/L (ref 3.5–5.1)
Sodium: 131 mmol/L — ABNORMAL LOW (ref 135–145)

## 2023-01-24 MED ORDER — HYDRALAZINE HCL 50 MG PO TABS: 25.0000 mg | ORAL_TABLET | Freq: Three times a day (TID) | ORAL | Status: DC

## 2023-01-24 MED ORDER — FUROSEMIDE 40 MG PO TABS: 20.0000 mg | ORAL_TABLET | Freq: Every day | ORAL | Status: DC

## 2023-01-24 NOTE — Discharge Summary (Signed)
Physician Discharge Summary  [name] [id number] DOB: [birth year] DOA: 01/20/2023  PCP: Haywood Pao, MD  Admit date: 01/20/2023 Discharge date: 01/24/2023  Time spent: 35 minutes  Recommendations for Outpatient Follow-up:  CHF heart failure clinic in 2 weeks PCP in 1 to 2 weeks, right renal lesion on imaging needs follow-up   Discharge Diagnoses:  Active Problems:   Acute on chronic systolic CHF (congestive heart failure) (HCC)   Hypertension associated with diabetes ([place])   Acute kidney injury superimposed on chronic kidney disease (HCC)   Atrial flutter (HCC)   CAD (coronary artery disease)   Hyperlipidemia associated with type 2 diabetes mellitus (HCC)   COPD (chronic obstructive pulmonary disease) ([place]) Right renal lesion  Discharge Condition: Improved  Diet recommendation: Low-sodium heart healthy  Filed Weights   01/22/23 0131 01/23/23 0156 01/24/23 0131  Weight: 82.2 kg 82 kg 82.7 kg    History of present illness:  77/M w CAD, type 2 diabetes, COPD/resp failure on 2-3L Home O2, GERD, hypertension, hyperlipidemia, prior CABG CHF, COPD presented to the ED w shortness of breath. 3/26 developed acute onset shortness of breath w/orthopnea and paroxysmal nocturnal dyspnea.  In ED,tachypneic with increased work of breathing.  Labs creatinine 1.9, BNP 1939, troponin 714, 547.  CXR w/  diffuse interstitial opacities likely pulmonary edema and bilateral pleural effusions.  CTA no evidence of PE however bilateral pleural effusions consistent with volume overload.  There was also a 3 cm right renal lesion enlarged compared to prior with recommended outpatient follow-up  Hospital Course:   Acute on chronic hypoxic respiratory failure  ACute on chronic systolic CHF -last ECHO 3/8 which demonstrated EF 35 to 40%.  There was elevated pulmonary artery systolic pressure as well. -Improving with diuretics, he is 8 L negative, creatinine trended up to 2.5, further diuretics  held, Wilder Glade was discontinued -Followed with heart failure team, since creatinine has improved to baseline of 2.1, I discussed with Dr.Sabharwal today, recommended Lasix 20 Mg daily, continue to hold Farxiga at discharge and he will continue Imdur and hydralazine as well -follow-up with Dr. Haroldine Laws in CHF clinic   AKI on CKD 3a -Baseline creatinine 1.5-2, now trended up to 2.5 yesterday -Diuresed aggressively and got contrast for CTA -Improving, creatinine down to 2.1 at discharge   Paroxysmal A-fib, atrial flutter -Now in sinus rhythm - Continue home amiodarone 100 mg daily and anticoagulation with Eliquis   CAD/CABG 2003 -Recent echo consistent with inferolateral MI, medical management recommended unless develops angina   COPD Chronic respiratory failure on home O2 prn -Usually 1 to 2 L   R Renal lesion -noted on CT, needs FU   Hypertension -continue amlodipine   Type 2 diabetes -patient uses 7030 at home.   -Increase Levemir dose, add meal coverage   Hyperlipidemia-continue Crestor   GERD-continue Protonix  Consultations: CHF team Discharge Exam: Vitals:   01/24/23 0744 01/24/23 0807  BP:  (!) 140/65  Pulse: 76 66  Resp: 15 18  Temp:  98 F (36.7 C)  SpO2: 96% 94%   Gen: Awake, Alert, Oriented X 3,  HEENT: no JVD Lungs: Good air movement bilaterally, CTAB CVS: S1S2/RRR Abd: soft, Non tender, non distended, BS present Extremities: No edema Skin: no new rashes on exposed skin   Discharge Instructions   Discharge Instructions     Diet - low sodium heart healthy   Complete by: As directed    Increase activity slowly   Complete by: As directed  Allergies as of 01/24/2023       Reactions   Aldactone [spironolactone] Other (See Comments)   Swollen breast   Lipitor [atorvastatin Calcium] Other (See Comments)   Muscle ache   Sulfa Drugs Cross Reactors Other (See Comments)   "raw spots"   Sulfamethoxazole-trimethoprim    Other reaction(s):  Rash   Codeine Rash        Medication List     STOP taking these medications    Farxiga 10 MG Tabs tablet Generic drug: dapagliflozin propanediol       TAKE these medications    albuterol 108 (90 Base) MCG/ACT inhaler Commonly known as: VENTOLIN HFA Inhale 2 puffs into the lungs every 6 (six) hours as needed for wheezing or shortness of breath.   amiodarone 200 MG tablet Commonly known as: Pacerone Take 0.5 tablets (100 mg total) by mouth daily.   amLODipine 10 MG tablet Commonly known as: NORVASC Take 1 tablet (10 mg total) by mouth daily.   apixaban 5 MG Tabs tablet Commonly known as: ELIQUIS Take 1 tablet (5 mg total) by mouth 2 (two) times daily.   cyanocobalamin 1000 MCG tablet Commonly known as: VITAMIN B12 Take 1,000 mcg by mouth daily.   ferrous sulfate 325 (65 FE) MG tablet Take 325 mg by mouth daily with breakfast.   furosemide 40 MG tablet Commonly known as: LASIX Take 0.5 tablets (20 mg total) by mouth daily. What changed:  how much to take when to take this reasons to take this   hydrALAZINE 50 MG tablet Commonly known as: APRESOLINE Take 0.5 tablets (25 mg total) by mouth 3 (three) times daily. What changed: how much to take   insulin NPH Human 100 UNIT/ML injection Commonly known as: NOVOLIN N Inject 35-40 Units into the skin See admin instructions. Inject 40 units into the skin in the morning and 35 units at night   insulin regular 100 units/mL injection Commonly known as: NOVOLIN R Inject 6-15 Units into the skin 3 (three) times daily before meals. Per sliding scale   isosorbide mononitrate 30 MG 24 hr tablet Commonly known as: IMDUR Take 1 tablet (30 mg total) by mouth daily.   nitroGLYCERIN 0.4 MG SL tablet Commonly known as: NITROSTAT Place 1 tablet (0.4 mg total) under the tongue every 5 (five) minutes as needed for chest pain.   pantoprazole 40 MG tablet Commonly known as: PROTONIX Take 40 mg by mouth daily.    rosuvastatin 20 MG tablet Commonly known as: CRESTOR Take 1 tablet by mouth once daily   Vitamin D-3 125 MCG (5000 UT) Tabs Take 5,000 Units by mouth daily.       Allergies  Allergen Reactions   Aldactone [Spironolactone] Other (See Comments)    Swollen breast   Lipitor [Atorvastatin Calcium] Other (See Comments)    Muscle ache   Sulfa Drugs Cross Reactors Other (See Comments)    "raw spots"   Sulfamethoxazole-Trimethoprim     Other reaction(s): Rash   Codeine Rash    Follow-up Information     [name], [name], MD Follow up.   Specialty: Internal Medicine Contact information: [place] [id number] [facility]. Go in 2 week(s).   Specialty: Cardiology Contact information: [address] [id number] [place] [phone]                 The results of significant diagnostics  from this hospitalization (including imaging, microbiology, ancillary and laboratory) are listed below for reference.    Significant Diagnostic Studies: CT Angio Chest PE W and/or Wo Contrast  Result Date: 01/20/2023 CLINICAL DATA:  Shortness of breath beginning this morning. Leg swelling. History of CHF. COPD. Prostate cancer. EXAM: CT ANGIOGRAPHY CHEST WITH CONTRAST TECHNIQUE: Multidetector CT imaging of the chest was performed using the standard protocol during bolus administration of intravenous contrast. Multiplanar CT image reconstructions and MIPs were obtained to evaluate the vascular anatomy. RADIATION DOSE REDUCTION: This exam was performed according to the departmental dose-optimization program which includes automated exposure control, adjustment of the mA and/or kV according to patient size and/or use of iterative reconstruction technique. CONTRAST:  86mL OMNIPAQUE IOHEXOL 350 MG/ML SOLN COMPARISON:  Chest radiograph of earlier today chest CT 11/16/2022 FINDINGS:  Cardiovascular: The quality of this exam for evaluation of pulmonary embolism is good. No evidence of pulmonary embolism. Aortic atherosclerosis. Tortuous thoracic aorta. Mild cardiomegaly, without pericardial effusion. Median sternotomy for CABG. Mediastinum/Nodes: Low right paratracheal node measures 1.3 cm on 56/5 versus 1.0 cm on the prior. Right hilar 1.3 cm node on 67/5. Tiny hiatal hernia. Lungs/Pleura: Small bilateral pleural effusions. Mild centrilobular and paraseptal emphysema. Mild compressive bibasilar atelectasis. Upper Abdomen: Trace perihepatic ascites. Normal imaged portions of the spleen, adrenal glands. An upper pole right renal 3.0 cm lesion on 150/5 has indeterminate density measurements and is enlarged from 2.2 cm back on 12/16/2018. Musculoskeletal: No acute osseous abnormality. Review of the MIP images confirms the above findings. IMPRESSION: 1.  No evidence of pulmonary embolism. 2. Bilateral pleural effusions and small volume perihepatic ascites, favoring fluid overload. 3. Aortic atherosclerosis (ICD10-I70.0) and emphysema (ICD10-J43.9). No other explanation for shortness of breath. 4. Borderline to mild thoracic adenopathy, most likely reactive and can be seen in the setting of fluid overload/congestive heart failure. 5. 3.0 cm upper pole right renal lesion has indeterminate density measurements and has enlarged compared to an abdominal CT of 12/16/2018. Cannot exclude indolent renal cell carcinoma. Consider further evaluation with nonemergent, outpatient pre and post-contrast MRI or CT. MRI is in general preferred but may be challenging if the patient's shortness of breath is chronic. 6.  Tiny hiatal hernia. Electronically Signed   By: Abigail Miyamoto M.D.   On: 01/20/2023 16:05   DG Chest Port 1 View  Result Date: 01/20/2023 CLINICAL DATA:  Shortness of breath EXAM: PORTABLE CHEST 1 VIEW COMPARISON:  Chest x-ray dated September 18, 2022 FINDINGS: Cardiac and mediastinal contours are  unchanged status post median sternotomy. Mild diffuse interstitial opacities, similar to prior. Trace bilateral pleural effusions. No evidence of pneumothorax. IMPRESSION: 1. Mild diffuse interstitial opacities, likely due to pulmonary edema. 2. Trace bilateral pleural effusions. Electronically Signed   By: Yetta Glassman M.D.   On: 01/20/2023 14:28   ECHOCARDIOGRAM COMPLETE  Result Date: 01/02/2023    ECHOCARDIOGRAM REPORT   Patient Name:   MADELINE RENTSCH Date of Exam: 01/02/2023 Medical Rec #:  [id number]     Height:       71.0 in Accession #:    [id number]    Weight:       181.7 lb Date of Birth:  1946/10/18     BSA:          2.024 m Patient Age:    3 years      BP:           166/70 mmHg Patient Gender: M  HR:           64 bpm. Exam Location:  [facility] Procedure: 2D Echo, Color Doppler, Cardiac Doppler and Intracardiac            Opacification Agent Indications:    Chronic Systolic Heart Failure [id number]  History:        Patient has prior history of Echocardiogram examinations.                 Ischemic Heart Disease, Prior CABG; Risk Factors:Diabetes and                 Hypertension.  Sonographer:    L. Thornton-Maynard Referring Phys: [id number] DANIEL R BENSIMHON IMPRESSIONS  1. Left ventricular ejection fraction, by estimation, is 35 to 40%. The left ventricle has moderately decreased function. The left ventricle has no regional wall motion abnormalities. Left ventricular diastolic parameters are indeterminate. There is hypokinesis of the left ventricular, entire inferoseptal wall, inferior wall and inferolateral wall.  2. Right ventricular systolic function is normal. The right ventricular size is normal. There is moderately elevated pulmonary artery systolic pressure. The estimated right ventricular systolic pressure is [id number] mmHg.  3. Left atrial size was mildly dilated.  4. The mitral valve is normal in structure. Mild mitral valve regurgitation. No evidence of mitral stenosis.  5. Tricuspid valve  regurgitation is mild to moderate.  6. The aortic valve is tricuspid. There is mild calcification of the aortic valve. Aortic valve regurgitation is moderate. Mild aortic valve stenosis. Aortic regurgitation PHT measures 467 msec. Aortic valve area, by VTI measures 1.90 cm. Aortic valve mean gradient measures 9.0 mmHg. Aortic valve Vmax measures 2.12 m/s.  7. The inferior vena cava is normal in size with greater than 50% respiratory variability, suggesting right atrial pressure of 3 mmHg. FINDINGS  Left Ventricle: Left ventricular ejection fraction, by estimation, is 35 to 40%. The left ventricle has moderately decreased function. The left ventricle has no regional wall motion abnormalities. The left ventricular internal cavity size was normal in size. There is no left ventricular hypertrophy. Left ventricular diastolic parameters are indeterminate. The ratio of pulmonic flow to systemic flow (Qp/Qs ratio) is 0.87. Right Ventricle: The right ventricular size is normal. No increase in right ventricular wall thickness. Right ventricular systolic function is normal. There is moderately elevated pulmonary artery systolic pressure. The tricuspid regurgitant velocity is 3.26 m/s, and with an assumed right atrial pressure of 3 mmHg, the estimated right ventricular systolic pressure is [id number] mmHg. Left Atrium: Left atrial size was mildly dilated. Right Atrium: Right atrial size was normal in size. Pericardium: There is no evidence of pericardial effusion. Mitral Valve: The mitral valve is normal in structure. Mild mitral valve regurgitation. No evidence of mitral valve stenosis. MV peak gradient, 12.4 mmHg. The mean mitral valve gradient is 3.0 mmHg. Tricuspid Valve: The tricuspid valve is normal in structure. Tricuspid valve regurgitation is mild to moderate. No evidence of tricuspid stenosis. Aortic Valve: The aortic valve is tricuspid. There is mild calcification of the aortic valve. Aortic valve regurgitation is  moderate. Aortic regurgitation PHT measures 467 msec. Mild aortic stenosis is present. Aortic valve mean gradient measures 9.0 mmHg. Aortic valve peak gradient measures 18.0 mmHg. Aortic valve area, by VTI measures 1.90 cm. Pulmonic Valve: The pulmonic valve was normal in structure. Pulmonic valve regurgitation is mild. No evidence of pulmonic stenosis. Aorta: The aortic root is normal in size and structure. Venous: The inferior vena cava is normal in size with greater  than 50% respiratory variability, suggesting right atrial pressure of 3 mmHg. IAS/Shunts: No atrial level shunt detected by color flow Doppler. The ratio of pulmonic flow to systemic flow (Qp/Qs ratio) is 0.87.  LEFT VENTRICLE PLAX 2D LVOT diam:     2.10 cm      Diastology LV SV:         82           LV e' medial:    6.74 cm/s LV SV Index:   41           LV E/e' medial:  22.8 LVOT Area:     3.46 cm     LV e' lateral:   6.42 cm/s                             LV E/e' lateral: 23.9  LV Volumes (MOD) LV vol d, MOD A2C: 215.0 ml LV vol d, MOD A4C: 172.0 ml LV vol s, MOD A2C: 126.0 ml LV vol s, MOD A4C: 103.0 ml LV SV MOD A2C:     89.0 ml LV SV MOD A4C:     172.0 ml LV SV MOD BP:      81.7 ml RIGHT VENTRICLE          IVC RVOT diam:      2.10 cm  IVC diam: 1.80 cm TAPSE (M-mode): 1.4 cm LEFT ATRIUM             Index        RIGHT ATRIUM           Index LA Vol (A2C):   75.1 ml 37.10 ml/m  RA Area:     19.80 cm LA Vol (A4C):   64.7 ml 31.96 ml/m  RA Volume:   58.10 ml  28.70 ml/m LA Biplane Vol: 71.1 ml 35.12 ml/m  AORTIC VALVE                     PULMONIC VALVE AV Area (Vmax):    1.96 cm      PV Area (Vmax):   2.62 cm AV Area (Vmean):   1.80 cm      PV Area (Vmean):  2.39 cm AV Area (VTI):     1.90 cm      PV Area (VTI):    2.49 cm AV Vmax:           212.00 cm/s   PV Vmax:          1.72 m/s AV Vmean:          141.000 cm/s  PV Vmean:         110.500 cm/s AV VTI:            0.435 m       PV VTI:           0.287 m AV Peak Grad:      18.0 mmHg     PV  Peak grad:     11.8 mmHg AV Mean Grad:      9.0 mmHg      PV Mean grad:     5.5 mmHg LVOT Vmax:         120.00 cm/s   PR End Diast Vel: 9.24 msec LVOT Vmean:        73.300 cm/s   RVOT Peak grad:   7 mmHg LVOT VTI:          0.238  m LVOT/AV VTI ratio: 0.55 AI PHT:            467 msec  AORTA Ao Root diam: 3.10 cm Ao Asc diam:  3.40 cm Ao Arch diam: 2.9 cm MITRAL VALVE                TRICUSPID VALVE MV Area (PHT): 6.14 cm     TR Peak grad:   42.5 mmHg MV Area VTI:   1.77 cm     TR Vmax:        326.00 cm/s MV Peak grad:  12.4 mmHg MV Mean grad:  3.0 mmHg     SHUNTS MV Vmax:       1.76 m/s     Systemic VTI:  0.24 m MV Vmean:      83.4 cm/s    Systemic Diam: 2.10 cm MV Decel Time: 124 msec     Pulmonic VTI:  0.206 m MV E velocity: 153.50 cm/s  Pulmonic Diam: 2.10 cm                             Qp/Qs:         0.87 Glori Bickers MD Electronically signed by Glori Bickers MD Signature Date/Time: 01/02/2023/2:42:00 PM    Final     Microbiology: No results found for this or any previous visit (from the past 240 hour(s)).   Labs: Basic Metabolic Panel: Recent Labs  Lab 01/21/23 0047 01/22/23 0039 01/23/23 0011 01/23/23 1252 01/24/23 0800  NA 134* 132* 133* 132* 131*  K 4.3 4.9 5.2* 5.3* 4.9  CL 105 102 103 102 105  CO2 18* 24 18* 18* 13*  GLUCOSE 205* 260* 234* 350* 177*  BUN 45* 57* 59* 59* 69*  CREATININE 2.00* 2.56* 2.59* 2.25* 2.16*  CALCIUM 8.6* 8.7* 9.1 8.9 9.0  MG 2.5* 2.5*  --   --   --    Liver Function Tests: No results for input(s): "AST", "ALT", "ALKPHOS", "BILITOT", "PROT", "ALBUMIN" in the last 168 hours. No results for input(s): "LIPASE", "AMYLASE" in the last 168 hours. No results for input(s): "AMMONIA" in the last 168 hours. CBC: Recent Labs  Lab 01/20/23 1357 01/20/23 1452  WBC 9.6  --   NEUTROABS 7.8*  --   HGB 13.4 11.6*  HCT 44.1 34.0*  MCV 87.8  --   PLT 252  --    Cardiac Enzymes: No results for input(s): "CKTOTAL", "CKMB", "CKMBINDEX", "TROPONINI" in the last  168 hours. BNP: BNP (last 3 results) Recent Labs    09/20/22 1129 10/16/22 1409 01/20/23 1357  BNP 1,408.9* 528.1* 1,939.2*    ProBNP (last 3 results) No results for input(s): "PROBNP" in the last 8760 hours.  CBG: Recent Labs  Lab 01/23/23 0610 01/23/23 1128 01/23/23 1626 01/23/23 2107 01/24/23 0617  GLUCAP 212* 269* 273* 221* 212*       Signed:  Domenic Polite MD.  [facility] 01/24/2023, 1:29 PM

## 2023-01-25 DIAGNOSIS — J9601 Acute respiratory failure with hypoxia: Secondary | ICD-10-CM | POA: Diagnosis not present

## 2023-01-25 NOTE — Progress Notes (Signed)
Synopsis: Referred for copd by [name], Adelfa Koh, MD  Subjective:   PATIENT ID: [name] GENDER: male DOB: 1946/04/04, MRN: [id number]  Chief Complaint  Patient presents with   Follow-up    Breathing is overall doing well. He has occ swelling in his feet and ankles.    76yM with history of CAD s/p CABG 2003, PAD s/p stents, R CEA, L CEA, GERD, prostate cancer, atrial flutter s/p TEE/DCCV 12/27 on amio, chronic combined systolic and diastolic CHF, OSA on BiPAP not using referred for COPD  DOE for about a year. He feels overall much better after DCCV however. To extent he thought about canceling this appt. No cough.   Recent admission for acute on chronic systolic and diastolic heart failure, IV diuresed and discharged on O2 2L continuous.   Never did use PAP for OSA.   Smoked 20 py quit in 2003. He worked as Haematologist.   Interval HPI: Discharged 3/22 after admission for volume overload. Does feel better since hospitalization. Some swelling in his BLE.   He weighs 181lb today, same as at discharge.   He has no cough.   He is using incruse 1 puff once daily since hospitalization and he does think it's helpful.   Otherwise pertinent review of systems is negative.  Past Medical History:  Diagnosis Date   Arthritis    CAD (coronary artery disease)    Carotid artery occlusion    Diabetes mellitus    fasting 100-120s   Dizziness    Dysrhythmia    skips beats   GERD (gastroesophageal reflux disease)    History of kidney stones    Hyperlipidemia    Hypertension    IHD (ischemic heart disease)    Prior CABG in 2003   Normal nuclear stress test June 2012   Peripheral vascular disease    Prostate cancer    Torn rotator cuff      Family History  Problem Relation Age of Onset   Heart attack Mother    Diabetes Mother    Heart disease Mother    Hypertension Mother    Heart attack Father    Heart disease Father    Hypertension Father     Diabetes Brother    Diabetes Sister    Diabetes Daughter    Heart disease Daughter    Hypertension Daughter    Diabetes Sister    Breast cancer Neg Hx    Prostate cancer Neg Hx    Colon cancer Neg Hx    Pancreatic cancer Neg Hx      Past Surgical History:  Procedure Laterality Date   ABDOMINAL AORTAGRAM N/A 12/21/2013   Procedure: ABDOMINAL Ronny Flurry;  Surgeon: Sherren Kerns, MD;  Location: [facility] CATH LAB;  Service: Cardiovascular;  Laterality: N/A;   ABDOMINAL AORTOGRAM N/A 11/13/2017   Procedure: ABDOMINAL AORTOGRAM;  Surgeon: Sherren Kerns, MD;  Location: [facility] INVASIVE CV LAB;  Service: Cardiovascular;  Laterality: N/A;   APPENDECTOMY     ARCH AORTOGRAM  08/26/2013   Procedure: ARCH AORTOGRAM;  Surgeon: Sherren Kerns, MD;  Location: [facility] CATH LAB;  Service: Cardiovascular;;   CARDIAC CATHETERIZATION  05/03/2002   EF 40-45%   CARDIOVASCULAR STRESS TEST  08/10/2006   EF 65%   CARDIOVERSION N/A 10/22/2022   Procedure: CARDIOVERSION;  Surgeon: Dolores Patty, MD;  Location: [facility] ENDOSCOPY;  Service: Cardiovascular;  Laterality: N/A;   CAROTID ENDARTERECTOMY     CAROTID STENT INSERTION Left 08/26/2013  Procedure: CAROTID STENT INSERTION;  Surgeon: Sherren Kerns, MD;  Location: [facility] CATH LAB;  Service: Cardiovascular;  Laterality: Left;  internal carotid   CORONARY ARTERY BYPASS GRAFT  04/2002   DOPPLER ECHOCARDIOGRAPHY  08/12/2002   EF 80-85%   ENDARTERECTOMY Left 07/20/2013   Procedure: ATTEMPTED ENDARTERECTOMY CAROTID;  Surgeon: Fransisco Hertz, MD;  Location: [facility] OR;  Service: Vascular;  Laterality: Left;   HERNIA REPAIR     LOWER EXTREMITY ANGIOGRAPHY  11/13/2017   Procedure: Lower Extremity Angiography;  Surgeon: Sherren Kerns, MD;  Location: [facility] INVASIVE CV LAB;  Service: Cardiovascular;;   PERIPHERAL VASCULAR INTERVENTION Right 11/13/2017   Procedure: PERIPHERAL VASCULAR INTERVENTION;  Surgeon: Sherren Kerns, MD;  Location: [facility] INVASIVE CV LAB;  Service: Cardiovascular;   Laterality: Right;  superficial femoral   RIGHT HEART CATH N/A 09/23/2022   Procedure: RIGHT HEART CATH;  Surgeon: Dolores Patty, MD;  Location: [facility] INVASIVE CV LAB;  Service: Cardiovascular;  Laterality: N/A;   SHOULDER SURGERY     TEE WITHOUT CARDIOVERSION N/A 10/22/2022   Procedure: TRANSESOPHAGEAL ECHOCARDIOGRAM (TEE);  Surgeon: Dolores Patty, MD;  Location: [facility] ENDOSCOPY;  Service: Cardiovascular;  Laterality: N/A;    Social History   Socioeconomic History   Marital status: Married    Spouse name: Not on file   Number of children: 2   Years of education: Not on file   Highest education level: Not on file  Occupational History   Not on file  Tobacco Use   Smoking status: Former    Packs/day: 1.00    Years: 20.00    Additional pack years: 0.00    Total pack years: 20.00    Types: Cigarettes    Quit date: 10/27/2001    Years since quitting: 21.2    Passive exposure: Never   Smokeless tobacco: Never  Vaping Use   Vaping Use: Never used  Substance and Sexual Activity   Alcohol use: No    Alcohol/week: 0.0 standard drinks of alcohol   Drug use: No   Sexual activity: Not Currently  Other Topics Concern   Not on file  Social History Narrative   Not on file   Social Determinants of Health   Financial Resource Strain: Not on file  Food Insecurity: No Food Insecurity (01/21/2023)   Hunger Vital Sign    Worried About Running Out of Food in the Last Year: Never true    Ran Out of Food in the Last Year: Never true  Transportation Needs: No Transportation Needs (01/21/2023)   PRAPARE - Administrator, Civil Service (Medical): No    Lack of Transportation (Non-Medical): No  Physical Activity: Not on file  Stress: Not on file  Social Connections: Not on file  Intimate Partner Violence: Not At Risk (01/21/2023)   Humiliation, Afraid, Rape, and Kick questionnaire    Fear of Current or Ex-Partner: No    Emotionally Abused: No    Physically Abused: No     Sexually Abused: No     Allergies  Allergen Reactions   Aldactone [Spironolactone] Other (See Comments)    Swollen breast   Lipitor [Atorvastatin Calcium] Other (See Comments)    Muscle ache   Sulfa Drugs Cross Reactors Other (See Comments)    "raw spots"   Sulfamethoxazole-Trimethoprim     Other reaction(s): Rash   Codeine Rash     Outpatient Medications Prior to Visit  Medication Sig Dispense Refill   albuterol (VENTOLIN HFA) 108 (90 Base)  MCG/ACT inhaler Inhale 2 puffs into the lungs every 6 (six) hours as needed for wheezing or shortness of breath.     amiodarone (PACERONE) 200 MG tablet Take 0.5 tablets (100 mg total) by mouth daily. 15 tablet 11   amLODipine (NORVASC) 10 MG tablet Take 1 tablet (10 mg total) by mouth daily. 30 tablet 11   apixaban (ELIQUIS) 5 MG TABS tablet Take 1 tablet (5 mg total) by mouth 2 (two) times daily. 60 tablet 8   Cholecalciferol (VITAMIN D-3) 125 MCG (5000 UT) TABS Take 5,000 Units by mouth daily.     cyanocobalamin (VITAMIN B12) 1000 MCG tablet Take 1,000 mcg by mouth daily.     ferrous sulfate 325 (65 FE) MG tablet Take 325 mg by mouth daily with breakfast.     furosemide (LASIX) 40 MG tablet Take 0.5 tablets (20 mg total) by mouth daily. 30 tablet    hydrALAZINE (APRESOLINE) 50 MG tablet Take 0.5 tablets (25 mg total) by mouth 3 (three) times daily.     insulin NPH Human (NOVOLIN N) 100 UNIT/ML injection Inject 35-40 Units into the skin See admin instructions. Inject 40 units into the skin in the morning and 35 units at night     insulin regular (NOVOLIN R) 100 units/mL injection Inject 6-15 Units into the skin 3 (three) times daily before meals. Per sliding scale     isosorbide mononitrate (IMDUR) 30 MG 24 hr tablet Take 1 tablet (30 mg total) by mouth daily. 90 tablet 3   nitroGLYCERIN (NITROSTAT) 0.4 MG SL tablet Place 1 tablet (0.4 mg total) under the tongue every 5 (five) minutes as needed for chest pain. 25 tablet 6   pantoprazole  (PROTONIX) 40 MG tablet Take 40 mg by mouth daily.     rosuvastatin (CRESTOR) 20 MG tablet Take 1 tablet by mouth once daily 90 tablet 3   No facility-administered medications prior to visit.       Objective:   Physical Exam:  General appearance: 77 y.o., male, NAD, conversant  Eyes: anicteric sclerae; PERRL, tracking appropriately HENT: NCAT; MMM Neck: Trachea midline; no lymphadenopathy, no JVD Lungs: diminished bl, with normal respiratory effort CV: RRR, no murmur  Abdomen: Soft, non-tender; non-distended, BS present  Extremities: No peripheral edema, warm Skin: Normal turgor and texture; no rash Psych: Appropriate affect Neuro: Alert and oriented to person and place, no focal deficit     Vitals:   01/26/23 1534  BP: 130/72  Pulse: 78  Temp: 98 F (36.7 C)  TempSrc: Oral  SpO2: 93%  Weight: 181 lb (82.1 kg)  Height: 5\' 10"  (1.778 m)    93% on RA BMI Readings from Last 3 Encounters:  01/26/23 25.97 kg/m  01/24/23 26.16 kg/m  01/02/23 24.71 kg/m   Wt Readings from Last 3 Encounters:  01/26/23 181 lb (82.1 kg)  01/24/23 182 lb 4.8 oz (82.7 kg)  01/02/23 177 lb 3.2 oz (80.4 kg)     CBC    Component Value Date/Time   WBC 9.6 01/20/2023 1357   RBC 5.02 01/20/2023 1357   HGB 11.6 (L) 01/20/2023 1452   HCT 34.0 (L) 01/20/2023 1452   PLT 252 01/20/2023 1357   MCV 87.8 01/20/2023 1357   MCH 26.7 01/20/2023 1357   MCHC 30.4 01/20/2023 1357   RDW 21.0 (H) 01/20/2023 1357   LYMPHSABS 1.0 01/20/2023 1357   MONOABS 0.7 01/20/2023 1357   EOSABS 0.1 01/20/2023 1357   BASOSABS 0.0 01/20/2023 1357  Chest Imaging: CXR 09/20/22 with cardiomegaly, small effusions  CTA Chest 01/20/23: volume overloaded with borderline enlarged mediastinal LNs, growing 3cm R renal lesion   Pulmonary Functions Testing Results:    Latest Ref Rng & Units 01/16/2023    9:32 AM  PFT Results  FVC-Pre L 2.83   FVC-Predicted Pre % 66   FVC-Post L 2.78   FVC-Predicted Post  % 65   Pre FEV1/FVC % % 79   Post FEV1/FCV % % 78   FEV1-Pre L 2.24   FEV1-Predicted Pre % 72   FEV1-Post L 2.18   DLCO uncorrected ml/min/mmHg 12.76   DLCO UNC% % 50   DLCO corrected ml/min/mmHg 12.76   DLCO COR %Predicted % 50   DLVA Predicted % 72   TLC L 4.58   TLC % Predicted % 65   RV % Predicted % 68     PFT 01/16/23 with mild restriction, moderately reduced diffusing capacity  Echocardiogram 08/2022:    1. Left ventricular ejection fraction, by estimation, is 30 to 35%. The  left ventricle has moderately decreased function. The left ventricle  demonstrates regional wall motion abnormalities (see scoring  diagram/findings for description). There is mild  concentric left ventricular hypertrophy. Left ventricular diastolic  parameters are consistent with Grade II diastolic dysfunction  (pseudonormalization). There is the interventricular septum is flattened  in systole and diastole, consistent with right  ventricular pressure and volume overload.   2. Right ventricular systolic function is mildly reduced. The right  ventricular size is normal. There is mildly elevated pulmonary artery  systolic pressure. The estimated right ventricular systolic pressure is  42.3 mmHg.   3. Moderate pleural effusion in the left lateral region.   4. The mitral valve is grossly normal. Mild mitral valve regurgitation.   5. Tricuspid valve regurgitation is mild to moderate.   6. The aortic valve is tricuspid. There is moderate calcification of the  aortic valve. Aortic valve regurgitation is mild. Aortic regurgitation PHT  measures 709 msec. Aortic valve mean gradient measures 3.5 mmHg.   7. The inferior vena cava is normal in size with <50% respiratory  variability, suggesting right atrial pressure of 8 mmHg.   Heart Catheterization 09/23/22:    RA = 8 RV = 48/10 PA = 45/20 (34) PCW = 13 Fick cardiac output/index = 6.0/3.0 Thermo CO/CI = 4.8/2.4  PVR = 3.3 WU (Fick) Ao sat =  90% PA sat = 55%, 58%     Assessment & Plan:   # DOE # chronic systolic and diastolic heart failure # multifactorial pulmonary hypertension # Emphysema, bronchial wall thickening # mild restriction (likely related to effusions), moderately reduced diffusing capacity likely related to PH Markedly improved after DCCV but still not quite back to baseline. He asks to try breztri again-  perhaps there is an overlap of emphysema/asthma.   # OSA not on CPAP  Plan: - can try breztri 2 puffs twice daily to see if this helps your shortness of breath with exertion - if shortness of breath with exertion worsening despite best efforts at taking care of excess fluid with lasix, ensuring heart rate under good control then we would be glad to see you again in clinic to help   RTC as needed  Omar Person, MD [facility] 01/26/2023 4:05 PM

## 2023-01-26 ENCOUNTER — Encounter: Payer: Self-pay | Admitting: Student

## 2023-01-26 ENCOUNTER — Other Ambulatory Visit: Payer: Self-pay | Admitting: Student

## 2023-01-26 ENCOUNTER — Ambulatory Visit: Payer: Medicare HMO | Admitting: Student

## 2023-01-26 VITALS — BP 130/72 | HR 78 | Temp 98.0°F | Ht 70.0 in | Wt 181.0 lb

## 2023-01-26 DIAGNOSIS — R0609 Other forms of dyspnea: Secondary | ICD-10-CM

## 2023-01-26 DIAGNOSIS — J432 Centrilobular emphysema: Secondary | ICD-10-CM

## 2023-01-26 DIAGNOSIS — E1142 Type 2 diabetes mellitus with diabetic polyneuropathy: Secondary | ICD-10-CM | POA: Diagnosis not present

## 2023-01-26 MED ORDER — BREZTRI AEROSPHERE 160-9-4.8 MCG/ACT IN AERO: 2.0000 | INHALATION_SPRAY | Freq: Two times a day (BID) | RESPIRATORY_TRACT | 0 refills | Status: DC

## 2023-01-26 NOTE — Patient Instructions (Signed)
-   can try breztri 2 puffs twice daily to see if this helps your shortness of breath with exertion. If it's worsening despite best efforts at taking care of excess fluid with lasix, ensuring heart rate under good control then we would be glad to see you again in clinic to help

## 2023-01-30 NOTE — Progress Notes (Signed)
ADVANCED HF CLINIC NOTE  Primary Care: [name], Adelfa Koh, MD Pulmonary: Dr Thora Lance HF Cardiologist: Dr. Gala Romney  HPI: Mr. Economos is a 77 y.o. male with CAD c/p CABG 2003, chronic diastolic CHF, chronic DOE, Rt CEA >75yrs ago, Lt CEA 14', HTN, DM2, HLD, PAD w/ Rt SFA and Rt politeal artery vascular stents 15', prostate cancer.    Admitted 11/23 with a/c systolic heart failure, newly reduced EF. Echo EF 30-35%, mild concentric LVH, GIIDD, RV function mildly reduced, mild MR, mild to mod TR, mild aortic valve regurg. Diuresed with IV lasix. Underwent RHC showing low filling pressures and preserved CO. GDMT started with Farxiga and Lasix. Other GDMT held with AKI. He was discharged home with oxygen, weight 172 lbs.  He was seen in the HF clinic 10/07/22. EKG showed new  aflutter. Started on eliquis and placed on amiodarone. Plan for TEE/DC-CV 10/22/22. Had blood work but it was not processed.   Had blood work with K >7.5.  Sent to ED and admitted on 10/17/22 with hyperkalemia and worsening renal function. Renal US negative for hydronephrosis. Lotrel, faxiga, eplerenone, lasix stopped. Discharged on 10/19/22.   10/22/22 S/P TEE/DC-CV--> EF 30-35% conversion to SR  Echo 01/02/23 EF 35-40% mild AS/mod AI mild MR.  Admitted 01/20/23 with a/c HF. Diuresed with IV lasix. GDMT limited by AKI. Discharged home weight 180 lbs.  Today he returns for post hospital HF follow up with his daughter (who is a Engineer, civil (consulting)). Overall feeling fine. He has SOB walking further distances on flat ground. He wears 2L oxygen at home. Main complaint is R foot bruising, no recent trauma to area. Abdomen is tight. Denies palpitations, CP, dizziness, or PND/Orthopnea. Appetite ok. No fever or chills. Weight at home 183 pounds. Taking all medications.   Cardiac Studies:  - Echo (3/24): EF 35-40% mild AS/mod AI mild MR   - RHC 11/23   RA = 8 RV = 48/10 PA = 45/20 (34) PCW = 13 Fick cardiac output/index = 6.0/3.0 Thermo CO/CI  = 4.8/2.4  PVR = 3.3 WU (Fick) Ao sat = 90% PA sat = 55%, 58%   TEE 10/22/22 EF 30-35%    - Echo (11/23): EF 30-35%, mild concentric LVH, GIIDD, RV function mildly reduced, mild MR, mild to mod TR, mild aortic valve regurg  - Echo (9/22): EF 60-65%, normal RV function, LA mildly elevated, trivial MR/TR, mild-mod aortic valve regurg   Past Medical History:  Diagnosis Date   Arthritis    CAD (coronary artery disease)    Carotid artery occlusion    Diabetes mellitus    fasting 100-120s   Dizziness    Dysrhythmia    skips beats   GERD (gastroesophageal reflux disease)    History of kidney stones    Hyperlipidemia    Hypertension    IHD (ischemic heart disease)    Prior CABG in 2003   Normal nuclear stress test June 2012   Peripheral vascular disease    Prostate cancer    Torn rotator cuff     Current Outpatient Medications  Medication Sig Dispense Refill   albuterol (VENTOLIN HFA) 108 (90 Base) MCG/ACT inhaler Inhale 2 puffs into the lungs every 6 (six) hours as needed for wheezing or shortness of breath.     amiodarone (PACERONE) 200 MG tablet Take 0.5 tablets (100 mg total) by mouth daily. 15 tablet 11   amLODipine (NORVASC) 10 MG tablet Take 1 tablet (10 mg total) by mouth daily. 30 tablet 11  apixaban (ELIQUIS) 5 MG TABS tablet Take 1 tablet (5 mg total) by mouth 2 (two) times daily. 60 tablet 8   Budeson-Glycopyrrol-Formoterol (BREZTRI AEROSPHERE) 160-9-4.8 MCG/ACT AERO Inhale 2 puffs into the lungs in the morning and at bedtime. 5.9 g 0   Budeson-Glycopyrrol-Formoterol (BREZTRI AEROSPHERE) 160-9-4.8 MCG/ACT AERO Inhale 2 puffs into the lungs in the morning and at bedtime. 5.9 g 0   Cholecalciferol (VITAMIN D-3) 125 MCG (5000 UT) TABS Take 5,000 Units by mouth daily.     cyanocobalamin (VITAMIN B12) 1000 MCG tablet Take 1,000 mcg by mouth daily.     ferrous sulfate 325 (65 FE) MG tablet Take 325 mg by mouth daily with breakfast.     furosemide (LASIX) 40 MG tablet Take  0.5 tablets (20 mg total) by mouth daily. 30 tablet    hydrALAZINE (APRESOLINE) 50 MG tablet Take 0.5 tablets (25 mg total) by mouth 3 (three) times daily.     insulin NPH Human (NOVOLIN N) 100 UNIT/ML injection Inject 35-40 Units into the skin See admin instructions. Inject 40 units into the skin in the morning and 35 units at night     insulin regular (NOVOLIN R) 100 units/mL injection Inject 6-15 Units into the skin 3 (three) times daily before meals. Per sliding scale     isosorbide mononitrate (IMDUR) 30 MG 24 hr tablet Take 1 tablet (30 mg total) by mouth daily. 90 tablet 3   nitroGLYCERIN (NITROSTAT) 0.4 MG SL tablet Place 1 tablet (0.4 mg total) under the tongue every 5 (five) minutes as needed for chest pain. 25 tablet 6   pantoprazole (PROTONIX) 40 MG tablet Take 40 mg by mouth daily.     rosuvastatin (CRESTOR) 20 MG tablet Take 1 tablet by mouth once daily 90 tablet 3   No current facility-administered medications for this encounter.   Allergies  Allergen Reactions   Aldactone [Spironolactone] Other (See Comments)    Swollen breast   Lipitor [Atorvastatin Calcium] Other (See Comments)    Muscle ache   Sulfa Drugs Cross Reactors Other (See Comments)    "raw spots"   Sulfamethoxazole-Trimethoprim     Other reaction(s): Rash   Codeine Rash   Social History   Socioeconomic History   Marital status: Married    Spouse name: Not on file   Number of children: 2   Years of education: Not on file   Highest education level: Not on file  Occupational History   Not on file  Tobacco Use   Smoking status: Former    Packs/day: 1.00    Years: 20.00    Additional pack years: 0.00    Total pack years: 20.00    Types: Cigarettes    Quit date: 10/27/2001    Years since quitting: 21.2    Passive exposure: Never   Smokeless tobacco: Never  Vaping Use   Vaping Use: Never used  Substance and Sexual Activity   Alcohol use: No    Alcohol/week: 0.0 standard drinks of alcohol   Drug use:  No   Sexual activity: Not Currently  Other Topics Concern   Not on file  Social History Narrative   Not on file   Social Determinants of Health   Financial Resource Strain: Not on file  Food Insecurity: No Food Insecurity (01/21/2023)   Hunger Vital Sign    Worried About Running Out of Food in the Last Year: Never true    Ran Out of Food in the Last Year: Never true  Transportation Needs:  No Transportation Needs (01/21/2023)   PRAPARE - Administrator, Civil Service (Medical): No    Lack of Transportation (Non-Medical): No  Physical Activity: Not on file  Stress: Not on file  Social Connections: Not on file  Intimate Partner Violence: Not At Risk (01/21/2023)   Humiliation, Afraid, Rape, and Kick questionnaire    Fear of Current or Ex-Partner: No    Emotionally Abused: No    Physically Abused: No    Sexually Abused: No   Family History  Problem Relation Age of Onset   Heart attack Mother    Diabetes Mother    Heart disease Mother    Hypertension Mother    Heart attack Father    Heart disease Father    Hypertension Father    Diabetes Brother    Diabetes Sister    Diabetes Daughter    Heart disease Daughter    Hypertension Daughter    Diabetes Sister    Breast cancer Neg Hx    Prostate cancer Neg Hx    Colon cancer Neg Hx    Pancreatic cancer Neg Hx    BP (!) 140/70   Pulse 77   Wt 84.8 kg (187 lb)   SpO2 94%   BMI 26.83 kg/m   Wt Readings from Last 3 Encounters:  02/02/23 84.8 kg (187 lb)  01/26/23 82.1 kg (181 lb)  01/24/23 82.7 kg (182 lb 4.8 oz)   PHYSICAL EXAM: General:  NAD. No resp difficulty, walked into clinic on oxygen HEENT: Normal Neck: Supple. JVP to ear. Carotids 2+ bilat; no bruits. No lymphadenopathy or thryomegaly appreciated. Cor: PMI nondisplaced. Regular rate & rhythm. No rubs, gallops or murmurs. Lungs: Clear Abdomen: Soft, nontender, +distended. No hepatosplenomegaly. No bruits or masses. Good bowel sounds. Extremities: No  cyanosis, clubbing, rash, 1-2+ BLE pre-tibial edema R>L with ecchymosis to lateral R foot. Neuro: Alert & oriented x 3, cranial nerves grossly intact. Moves all 4 extremities w/o difficulty. Affect pleasant.  ECG (personally reviewed): SR 1AVB 70 bpm  ASSESSMENT & PLAN: Chronic combined systolic and diastolic CHF, previously HFpEF now HFrEF - Echo 9/22: EF 60-65%, normal RV function, LA mildly elevated, trivial MR/TR, mild-mod aortic valve regurg  - Echo (11/23): EF 30-35%, mild concentric LVH, GIIDD, RV function mildly reduced, mild MR, mild to mod TR, mild aortic valve regurg - RHC (11/23) with low filling pressures and preserved cardiac output.  - Echo (01/02/23): EF 35-40% mild AS/mod AI mild MR - NYHA II-III, symptoms confound by lung disease. Volume up today, weight up 7 lbs. - Restart Farxiga 10 mg daily. - Continue hydralazine 25 mg tid + Imdur 30 mg daily. - Continue Lasix 20 mg daily. - No MRA or Acei/ARNi with hyperkalemia.  - Off BB for now with recent acute exacerbation. Add back after diuresis. - Needs sleep study but he declines today. - Initially felt that reduction in EF due to AF but recent echo c/w inferior/inferolateral MI. Suspect probable SVG failure. Given CKD and lack of angina will not pursue cath at this time  - Labs today.  2. AFL - 10/22/22 TEE/DC-CV ---> SR - Remains in NSR - Continue amiodarone 100 mg daily. No ideal with COPD and will eventually place on beta selective BB---> Bisoprolol  - Continue Eliquis 5 mg bid. No bleeding issues. - He does not want to pursue sleep study.    3. CAD s/p CABG  - Hx CABG 2003 - No s/s angina - No aspirin and plavix.   -  On Eliquis and Crestor.  - Echo c/w inferior/inferolateral MI. Plan as above -> medical rx unless develops angina  4. Mild AS/moderate AI on echo - Stable - Follow with echo  5. CKD 3a - SCR baseline 1.4-2.3 - Restart SGLT2i - BMET today.  6. PAD - s/p intervention 2015 Rt SFA and Rt politeal  artery vascular stents - Denies claudication.  7. HTN - Elevated.  - Diurese as above. - Plan to add beta blocker next.  8. DM 2 - Hgb A1c 7.0 - He is on insulin. - Restart Farxiga.  9. COPD/emphysema - Continue home oxygen - He is followed by Dr. Thora Lance  10. Carotid artery stenosis - S/p b/l CEA - Continue statin.  11. Hyperkalemia  - Off MRA/ACEi due to recent hyperkalemia. Will keep off for now.  - No longer using Mrs. Dash - Labs today.  Follow up with APP as scheduled in 4 weeks (add bisoprolol), 4 months with Dr. Gala Romney + echo.   Jacklynn Ganong, FNP  10:32 AM

## 2023-02-02 ENCOUNTER — Encounter (HOSPITAL_COMMUNITY): Payer: Self-pay

## 2023-02-02 ENCOUNTER — Ambulatory Visit (HOSPITAL_COMMUNITY)
Admit: 2023-02-02 | Discharge: 2023-02-02 | Disposition: A | Discharge status: Home / Self Care | Payer: Medicare HMO | Attending: Family Medicine | Admitting: Family Medicine

## 2023-02-02 VITALS — BP 140/70 | HR 77 | Wt 187.0 lb

## 2023-02-02 DIAGNOSIS — R0609 Other forms of dyspnea: Secondary | ICD-10-CM

## 2023-02-02 DIAGNOSIS — I480 Paroxysmal atrial fibrillation: Secondary | ICD-10-CM | POA: Diagnosis not present

## 2023-02-02 DIAGNOSIS — I083 Combined rheumatic disorders of mitral, aortic and tricuspid valves: Secondary | ICD-10-CM | POA: Diagnosis not present

## 2023-02-02 DIAGNOSIS — Z79899 Other long term (current) drug therapy: Secondary | ICD-10-CM | POA: Insufficient documentation

## 2023-02-02 DIAGNOSIS — I739 Peripheral vascular disease, unspecified: Secondary | ICD-10-CM

## 2023-02-02 DIAGNOSIS — R0602 Shortness of breath: Secondary | ICD-10-CM | POA: Insufficient documentation

## 2023-02-02 DIAGNOSIS — E785 Hyperlipidemia, unspecified: Secondary | ICD-10-CM | POA: Insufficient documentation

## 2023-02-02 DIAGNOSIS — N183 Chronic kidney disease, stage 3 unspecified: Secondary | ICD-10-CM | POA: Diagnosis not present

## 2023-02-02 DIAGNOSIS — E1151 Type 2 diabetes mellitus with diabetic peripheral angiopathy without gangrene: Secondary | ICD-10-CM | POA: Insufficient documentation

## 2023-02-02 DIAGNOSIS — I4892 Unspecified atrial flutter: Secondary | ICD-10-CM | POA: Insufficient documentation

## 2023-02-02 DIAGNOSIS — Z794 Long term (current) use of insulin: Secondary | ICD-10-CM | POA: Insufficient documentation

## 2023-02-02 DIAGNOSIS — J449 Chronic obstructive pulmonary disease, unspecified: Secondary | ICD-10-CM | POA: Insufficient documentation

## 2023-02-02 DIAGNOSIS — I130 Hypertensive heart and chronic kidney disease with heart failure and stage 1 through stage 4 chronic kidney disease, or unspecified chronic kidney disease: Secondary | ICD-10-CM | POA: Insufficient documentation

## 2023-02-02 DIAGNOSIS — C61 Malignant neoplasm of prostate: Secondary | ICD-10-CM | POA: Insufficient documentation

## 2023-02-02 DIAGNOSIS — E1122 Type 2 diabetes mellitus with diabetic chronic kidney disease: Secondary | ICD-10-CM | POA: Diagnosis not present

## 2023-02-02 DIAGNOSIS — I25118 Atherosclerotic heart disease of native coronary artery with other forms of angina pectoris: Secondary | ICD-10-CM

## 2023-02-02 DIAGNOSIS — I6523 Occlusion and stenosis of bilateral carotid arteries: Secondary | ICD-10-CM

## 2023-02-02 DIAGNOSIS — I2510 Atherosclerotic heart disease of native coronary artery without angina pectoris: Secondary | ICD-10-CM | POA: Diagnosis not present

## 2023-02-02 DIAGNOSIS — I5042 Chronic combined systolic (congestive) and diastolic (congestive) heart failure: Secondary | ICD-10-CM | POA: Insufficient documentation

## 2023-02-02 DIAGNOSIS — M79671 Pain in right foot: Secondary | ICD-10-CM | POA: Diagnosis not present

## 2023-02-02 DIAGNOSIS — Z951 Presence of aortocoronary bypass graft: Secondary | ICD-10-CM | POA: Diagnosis not present

## 2023-02-02 DIAGNOSIS — Z955 Presence of coronary angioplasty implant and graft: Secondary | ICD-10-CM | POA: Insufficient documentation

## 2023-02-02 DIAGNOSIS — N1831 Chronic kidney disease, stage 3a: Secondary | ICD-10-CM | POA: Diagnosis not present

## 2023-02-02 DIAGNOSIS — I6529 Occlusion and stenosis of unspecified carotid artery: Secondary | ICD-10-CM | POA: Diagnosis not present

## 2023-02-02 DIAGNOSIS — Z7901 Long term (current) use of anticoagulants: Secondary | ICD-10-CM | POA: Diagnosis not present

## 2023-02-02 DIAGNOSIS — E875 Hyperkalemia: Secondary | ICD-10-CM | POA: Diagnosis not present

## 2023-02-02 DIAGNOSIS — I10 Essential (primary) hypertension: Secondary | ICD-10-CM

## 2023-02-02 DIAGNOSIS — Z7984 Long term (current) use of oral hypoglycemic drugs: Secondary | ICD-10-CM | POA: Diagnosis not present

## 2023-02-02 DIAGNOSIS — I351 Nonrheumatic aortic (valve) insufficiency: Secondary | ICD-10-CM

## 2023-02-02 LAB — BASIC METABOLIC PANEL
Anion gap: 12 (ref 5–15)
BUN: 52 mg/dL — ABNORMAL HIGH (ref 8–23)
CO2: 19 mmol/L — ABNORMAL LOW (ref 22–32)
Calcium: 8.8 mg/dL — ABNORMAL LOW (ref 8.9–10.3)
Chloride: 105 mmol/L (ref 98–111)
Creatinine, Ser: 2.24 mg/dL — ABNORMAL HIGH (ref 0.61–1.24)
GFR, Estimated: 30 mL/min — ABNORMAL LOW (ref >60)
Glucose, Bld: 239 mg/dL — ABNORMAL HIGH (ref 70–99)
Potassium: 4.6 mmol/L (ref 3.5–5.1)
Sodium: 136 mmol/L (ref 135–145)

## 2023-02-02 LAB — BRAIN NATRIURETIC PEPTIDE: B Natriuretic Peptide: 2275.8 pg/mL — ABNORMAL HIGH (ref 0.0–100.0)

## 2023-02-02 MED ORDER — DAPAGLIFLOZIN PROPANEDIOL 10 MG PO TABS: 10.0000 mg | ORAL_TABLET | Freq: Every day | ORAL | 8 refills | Status: DC

## 2023-02-02 NOTE — Patient Instructions (Addendum)
Thank you for coming in today  Labs were done today, if any labs are abnormal the clinic will call you No news is good news  Medications: START Farxiga 10 mg 1 tablet daily   Follow up appointments:  Your physician recommends that you schedule a follow-up appointment in:  Keep appointment on 03/06/2023    Do the following things EVERYDAY: Weigh yourself in the morning before breakfast. Write it down and keep it in a log. Take your medicines as prescribed Eat low salt foods--Limit salt (sodium) to 2000 mg per day.  Stay as active as you can everyday Limit all fluids for the day to less than 2 liters   At the [facility], you and your health needs are our priority. As part of our continuing mission to provide you with exceptional heart care, we have created designated Provider Care Teams. These Care Teams include your primary Cardiologist (physician) and Advanced Practice Providers (APPs- Physician Assistants and Nurse Practitioners) who all work together to provide you with the care you need, when you need it.   You may see any of the following providers on your designated Care Team at your next follow up: Dr Arvilla Meres Dr Marca Ancona Dr. Marcos Eke, NP Robbie Lis, [place] [facility] [place], [place] Brynda Peon, NP Karle Plumber, PharmD   Please be sure to bring in all your medications bottles to every appointment.    Thank you for choosing [facility]  If you have any questions or concerns before your next appointment please send [place] a message through [place] or call our office at [phone].    TO LEAVE A MESSAGE FOR THE NURSE SELECT OPTION 2, PLEASE LEAVE A MESSAGE INCLUDING: YOUR NAME DATE OF BIRTH CALL BACK NUMBER REASON FOR CALL**this is important as we prioritize the call backs  YOU WILL RECEIVE A CALL BACK THE SAME DAY AS LONG AS YOU CALL BEFORE 4:00 PM

## 2023-02-03 ENCOUNTER — Ambulatory Visit: Payer: Medicare HMO | Admitting: Podiatry

## 2023-02-05 ENCOUNTER — Other Ambulatory Visit (HOSPITAL_COMMUNITY): Payer: Self-pay

## 2023-02-06 DIAGNOSIS — N289 Disorder of kidney and ureter, unspecified: Secondary | ICD-10-CM | POA: Diagnosis not present

## 2023-02-06 DIAGNOSIS — E1151 Type 2 diabetes mellitus with diabetic peripheral angiopathy without gangrene: Secondary | ICD-10-CM | POA: Diagnosis not present

## 2023-02-06 DIAGNOSIS — I272 Pulmonary hypertension, unspecified: Secondary | ICD-10-CM | POA: Diagnosis not present

## 2023-02-06 DIAGNOSIS — I130 Hypertensive heart and chronic kidney disease with heart failure and stage 1 through stage 4 chronic kidney disease, or unspecified chronic kidney disease: Secondary | ICD-10-CM | POA: Diagnosis not present

## 2023-02-06 DIAGNOSIS — J984 Other disorders of lung: Secondary | ICD-10-CM | POA: Diagnosis not present

## 2023-02-09 ENCOUNTER — Other Ambulatory Visit (HOSPITAL_COMMUNITY): Payer: Self-pay

## 2023-02-09 ENCOUNTER — Encounter (HOSPITAL_COMMUNITY): Payer: Self-pay

## 2023-02-09 DIAGNOSIS — I5042 Chronic combined systolic (congestive) and diastolic (congestive) heart failure: Secondary | ICD-10-CM

## 2023-02-09 MED ORDER — POTASSIUM CHLORIDE CRYS ER 10 MEQ PO TBCR: 10.0000 meq | EXTENDED_RELEASE_TABLET | Freq: Two times a day (BID) | ORAL | 1 refills | Status: DC

## 2023-02-10 ENCOUNTER — Other Ambulatory Visit: Payer: Self-pay

## 2023-02-10 ENCOUNTER — Inpatient Hospital Stay (HOSPITAL_BASED_OUTPATIENT_CLINIC_OR_DEPARTMENT_OTHER)
Admission: EM | Admit: 2023-02-10 | Discharge: 2023-03-04 | DRG: 286 | Disposition: A | Discharge status: Home Health | Payer: Medicare HMO | Attending: Internal Medicine | Admitting: Internal Medicine

## 2023-02-10 ENCOUNTER — Encounter (HOSPITAL_BASED_OUTPATIENT_CLINIC_OR_DEPARTMENT_OTHER): Payer: Self-pay | Admitting: Internal Medicine

## 2023-02-10 ENCOUNTER — Other Ambulatory Visit (HOSPITAL_BASED_OUTPATIENT_CLINIC_OR_DEPARTMENT_OTHER): Payer: Self-pay

## 2023-02-10 ENCOUNTER — Emergency Department (HOSPITAL_BASED_OUTPATIENT_CLINIC_OR_DEPARTMENT_OTHER): Payer: Medicare HMO | Admitting: Radiology

## 2023-02-10 ENCOUNTER — Telehealth (HOSPITAL_COMMUNITY): Payer: Self-pay

## 2023-02-10 DIAGNOSIS — J984 Other disorders of lung: Secondary | ICD-10-CM | POA: Diagnosis present

## 2023-02-10 DIAGNOSIS — E1165 Type 2 diabetes mellitus with hyperglycemia: Secondary | ICD-10-CM | POA: Diagnosis present

## 2023-02-10 DIAGNOSIS — Z515 Encounter for palliative care: Secondary | ICD-10-CM

## 2023-02-10 DIAGNOSIS — E1151 Type 2 diabetes mellitus with diabetic peripheral angiopathy without gangrene: Secondary | ICD-10-CM | POA: Diagnosis present

## 2023-02-10 DIAGNOSIS — R040 Epistaxis: Secondary | ICD-10-CM | POA: Diagnosis present

## 2023-02-10 DIAGNOSIS — E1122 Type 2 diabetes mellitus with diabetic chronic kidney disease: Secondary | ICD-10-CM | POA: Diagnosis not present

## 2023-02-10 DIAGNOSIS — I4892 Unspecified atrial flutter: Secondary | ICD-10-CM | POA: Diagnosis not present

## 2023-02-10 DIAGNOSIS — I082 Rheumatic disorders of both aortic and tricuspid valves: Secondary | ICD-10-CM | POA: Diagnosis not present

## 2023-02-10 DIAGNOSIS — J439 Emphysema, unspecified: Secondary | ICD-10-CM | POA: Diagnosis not present

## 2023-02-10 DIAGNOSIS — Z951 Presence of aortocoronary bypass graft: Secondary | ICD-10-CM

## 2023-02-10 DIAGNOSIS — I272 Pulmonary hypertension, unspecified: Secondary | ICD-10-CM | POA: Diagnosis present

## 2023-02-10 DIAGNOSIS — N179 Acute kidney failure, unspecified: Secondary | ICD-10-CM | POA: Diagnosis not present

## 2023-02-10 DIAGNOSIS — Z9981 Dependence on supplemental oxygen: Secondary | ICD-10-CM | POA: Diagnosis not present

## 2023-02-10 DIAGNOSIS — I255 Ischemic cardiomyopathy: Secondary | ICD-10-CM | POA: Diagnosis present

## 2023-02-10 DIAGNOSIS — Z794 Long term (current) use of insulin: Secondary | ICD-10-CM | POA: Diagnosis not present

## 2023-02-10 DIAGNOSIS — Z66 Do not resuscitate: Secondary | ICD-10-CM | POA: Diagnosis not present

## 2023-02-10 DIAGNOSIS — K219 Gastro-esophageal reflux disease without esophagitis: Secondary | ICD-10-CM | POA: Diagnosis present

## 2023-02-10 DIAGNOSIS — D6832 Hemorrhagic disorder due to extrinsic circulating anticoagulants: Secondary | ICD-10-CM | POA: Diagnosis not present

## 2023-02-10 DIAGNOSIS — I5043 Acute on chronic combined systolic (congestive) and diastolic (congestive) heart failure: Secondary | ICD-10-CM | POA: Diagnosis present

## 2023-02-10 DIAGNOSIS — J9611 Chronic respiratory failure with hypoxia: Secondary | ICD-10-CM | POA: Insufficient documentation

## 2023-02-10 DIAGNOSIS — E119 Type 2 diabetes mellitus without complications: Secondary | ICD-10-CM

## 2023-02-10 DIAGNOSIS — Y92239 Unspecified place in hospital as the place of occurrence of the external cause: Secondary | ICD-10-CM | POA: Diagnosis not present

## 2023-02-10 DIAGNOSIS — Z885 Allergy status to narcotic agent status: Secondary | ICD-10-CM

## 2023-02-10 DIAGNOSIS — E875 Hyperkalemia: Secondary | ICD-10-CM | POA: Diagnosis present

## 2023-02-10 DIAGNOSIS — N62 Hypertrophy of breast: Secondary | ICD-10-CM | POA: Diagnosis present

## 2023-02-10 DIAGNOSIS — E876 Hypokalemia: Secondary | ICD-10-CM | POA: Diagnosis present

## 2023-02-10 DIAGNOSIS — Z882 Allergy status to sulfonamides status: Secondary | ICD-10-CM

## 2023-02-10 DIAGNOSIS — Z87442 Personal history of urinary calculi: Secondary | ICD-10-CM

## 2023-02-10 DIAGNOSIS — I5023 Acute on chronic systolic (congestive) heart failure: Secondary | ICD-10-CM | POA: Diagnosis present

## 2023-02-10 DIAGNOSIS — Z7901 Long term (current) use of anticoagulants: Secondary | ICD-10-CM

## 2023-02-10 DIAGNOSIS — E1169 Type 2 diabetes mellitus with other specified complication: Secondary | ICD-10-CM | POA: Diagnosis not present

## 2023-02-10 DIAGNOSIS — J9621 Acute and chronic respiratory failure with hypoxia: Secondary | ICD-10-CM | POA: Diagnosis present

## 2023-02-10 DIAGNOSIS — E871 Hypo-osmolality and hyponatremia: Secondary | ICD-10-CM | POA: Diagnosis not present

## 2023-02-10 DIAGNOSIS — Z79899 Other long term (current) drug therapy: Secondary | ICD-10-CM

## 2023-02-10 DIAGNOSIS — I509 Heart failure, unspecified: Principal | ICD-10-CM

## 2023-02-10 DIAGNOSIS — I959 Hypotension, unspecified: Secondary | ICD-10-CM | POA: Diagnosis not present

## 2023-02-10 DIAGNOSIS — R0602 Shortness of breath: Secondary | ICD-10-CM | POA: Diagnosis not present

## 2023-02-10 DIAGNOSIS — Z8249 Family history of ischemic heart disease and other diseases of the circulatory system: Secondary | ICD-10-CM

## 2023-02-10 DIAGNOSIS — F419 Anxiety disorder, unspecified: Secondary | ICD-10-CM | POA: Diagnosis present

## 2023-02-10 DIAGNOSIS — Z888 Allergy status to other drugs, medicaments and biological substances status: Secondary | ICD-10-CM

## 2023-02-10 DIAGNOSIS — Z87891 Personal history of nicotine dependence: Secondary | ICD-10-CM

## 2023-02-10 DIAGNOSIS — I130 Hypertensive heart and chronic kidney disease with heart failure and stage 1 through stage 4 chronic kidney disease, or unspecified chronic kidney disease: Secondary | ICD-10-CM | POA: Diagnosis not present

## 2023-02-10 DIAGNOSIS — E785 Hyperlipidemia, unspecified: Secondary | ICD-10-CM | POA: Diagnosis not present

## 2023-02-10 DIAGNOSIS — N1831 Chronic kidney disease, stage 3a: Secondary | ICD-10-CM | POA: Diagnosis present

## 2023-02-10 DIAGNOSIS — T45515A Adverse effect of anticoagulants, initial encounter: Secondary | ICD-10-CM | POA: Diagnosis not present

## 2023-02-10 DIAGNOSIS — Z833 Family history of diabetes mellitus: Secondary | ICD-10-CM

## 2023-02-10 DIAGNOSIS — I4510 Unspecified right bundle-branch block: Secondary | ICD-10-CM | POA: Diagnosis present

## 2023-02-10 DIAGNOSIS — Z8546 Personal history of malignant neoplasm of prostate: Secondary | ICD-10-CM

## 2023-02-10 DIAGNOSIS — I4891 Unspecified atrial fibrillation: Secondary | ICD-10-CM | POA: Diagnosis present

## 2023-02-10 DIAGNOSIS — N189 Chronic kidney disease, unspecified: Secondary | ICD-10-CM | POA: Insufficient documentation

## 2023-02-10 DIAGNOSIS — J90 Pleural effusion, not elsewhere classified: Secondary | ICD-10-CM | POA: Diagnosis not present

## 2023-02-10 DIAGNOSIS — Z95820 Peripheral vascular angioplasty status with implants and grafts: Secondary | ICD-10-CM | POA: Diagnosis not present

## 2023-02-10 DIAGNOSIS — I110 Hypertensive heart disease with heart failure: Secondary | ICD-10-CM | POA: Diagnosis not present

## 2023-02-10 DIAGNOSIS — J449 Chronic obstructive pulmonary disease, unspecified: Secondary | ICD-10-CM | POA: Diagnosis present

## 2023-02-10 DIAGNOSIS — Z881 Allergy status to other antibiotic agents status: Secondary | ICD-10-CM

## 2023-02-10 DIAGNOSIS — I4729 Other ventricular tachycardia: Secondary | ICD-10-CM | POA: Diagnosis present

## 2023-02-10 DIAGNOSIS — I440 Atrioventricular block, first degree: Secondary | ICD-10-CM | POA: Diagnosis present

## 2023-02-10 DIAGNOSIS — I2510 Atherosclerotic heart disease of native coronary artery without angina pectoris: Secondary | ICD-10-CM | POA: Diagnosis present

## 2023-02-10 LAB — FOLATE: Folate: 16.1 ng/mL (ref >5.9)

## 2023-02-10 LAB — COMPREHENSIVE METABOLIC PANEL
ALT: 26 U/L (ref 0–44)
AST: 30 U/L (ref 15–41)
Albumin: 3.4 g/dL — ABNORMAL LOW (ref 3.5–5.0)
Alkaline Phosphatase: 86 U/L (ref 38–126)
Anion gap: 12 (ref 5–15)
BUN: 45 mg/dL — ABNORMAL HIGH (ref 8–23)
CO2: 22 mmol/L (ref 22–32)
Calcium: 8.8 mg/dL — ABNORMAL LOW (ref 8.9–10.3)
Chloride: 100 mmol/L (ref 98–111)
Creatinine, Ser: 2.31 mg/dL — ABNORMAL HIGH (ref 0.61–1.24)
GFR, Estimated: 29 mL/min — ABNORMAL LOW (ref >60)
Glucose, Bld: 69 mg/dL — ABNORMAL LOW (ref 70–99)
Potassium: 3.8 mmol/L (ref 3.5–5.1)
Sodium: 134 mmol/L — ABNORMAL LOW (ref 135–145)
Total Bilirubin: 0.7 mg/dL (ref 0.3–1.2)
Total Protein: 6.8 g/dL (ref 6.5–8.1)

## 2023-02-10 LAB — CBC
HCT: 38 % — ABNORMAL LOW (ref 39.0–52.0)
Hemoglobin: 11.8 g/dL — ABNORMAL LOW (ref 13.0–17.0)
MCH: 27.6 pg (ref 26.0–34.0)
MCHC: 31.1 g/dL (ref 30.0–36.0)
MCV: 88.8 fL (ref 80.0–100.0)
Platelets: 184 10*3/uL (ref 150–400)
RBC: 4.28 MIL/uL (ref 4.22–5.81)
RDW: 20.8 % — ABNORMAL HIGH (ref 11.5–15.5)
WBC: 5.9 10*3/uL (ref 4.0–10.5)
nRBC: 0 % (ref 0.0–0.2)

## 2023-02-10 LAB — HEPARIN LEVEL (UNFRACTIONATED): Heparin Unfractionated: >1.1 IU/mL — ABNORMAL HIGH (ref 0.30–0.70)

## 2023-02-10 LAB — BASIC METABOLIC PANEL
Anion gap: 9 (ref 5–15)
BUN: 49 mg/dL — ABNORMAL HIGH (ref 8–23)
CO2: 24 mmol/L (ref 22–32)
Calcium: 8.8 mg/dL — ABNORMAL LOW (ref 8.9–10.3)
Chloride: 102 mmol/L (ref 98–111)
Creatinine, Ser: 2.32 mg/dL — ABNORMAL HIGH (ref 0.61–1.24)
GFR, Estimated: 29 mL/min — ABNORMAL LOW (ref >60)
Glucose, Bld: 169 mg/dL — ABNORMAL HIGH (ref 70–99)
Potassium: 4 mmol/L (ref 3.5–5.1)
Sodium: 135 mmol/L (ref 135–145)

## 2023-02-10 LAB — VITAMIN B12: Vitamin B-12: 682 pg/mL (ref 180–914)

## 2023-02-10 LAB — RETICULOCYTES
Immature Retic Fract: 21.8 % — ABNORMAL HIGH (ref 2.3–15.9)
RBC.: 4.6 MIL/uL (ref 4.22–5.81)
Retic Count, Absolute: 115 10*3/uL (ref 19.0–186.0)
Retic Ct Pct: 2.5 % (ref 0.4–3.1)

## 2023-02-10 LAB — IRON AND TIBC
Iron: 24 ug/dL — ABNORMAL LOW (ref 45–182)
Saturation Ratios: 6 % — ABNORMAL LOW (ref 17.9–39.5)
TIBC: 374 ug/dL (ref 250–450)
UIBC: 350 ug/dL

## 2023-02-10 LAB — BRAIN NATRIURETIC PEPTIDE: B Natriuretic Peptide: 2508.7 pg/mL — ABNORMAL HIGH (ref 0.0–100.0)

## 2023-02-10 LAB — APTT: aPTT: 45 seconds — ABNORMAL HIGH (ref 24–36)

## 2023-02-10 LAB — FERRITIN: Ferritin: 48 ng/mL (ref 24–336)

## 2023-02-10 MED ORDER — UMECLIDINIUM BROMIDE 62.5 MCG/ACT IN AEPB
1.0000 | INHALATION_SPRAY | Freq: Every day | RESPIRATORY_TRACT | Status: DC
  Administered 2023-02-11 – 2023-03-04 (×19): 1 via RESPIRATORY_TRACT
  Filled 2023-02-10 (×4): qty 7

## 2023-02-10 MED ORDER — BUDESON-GLYCOPYRROL-FORMOTEROL 160-9-4.8 MCG/ACT IN AERO: 2.0000 | INHALATION_SPRAY | Freq: Two times a day (BID) | RESPIRATORY_TRACT | Status: DC

## 2023-02-10 MED ORDER — LINACLOTIDE 72 MCG PO CAPS
72.0000 ug | ORAL_CAPSULE | Freq: Every morning | ORAL | Status: DC
  Administered 2023-02-11 – 2023-03-03 (×19): 72 ug via ORAL
  Filled 2023-02-10 (×22): qty 1

## 2023-02-10 MED ORDER — PANTOPRAZOLE SODIUM 40 MG PO TBEC
40.0000 mg | DELAYED_RELEASE_TABLET | Freq: Every day | ORAL | Status: DC
  Administered 2023-02-11 – 2023-03-04 (×22): 40 mg via ORAL
  Filled 2023-02-10 (×22): qty 1

## 2023-02-10 MED ORDER — INSULIN ASPART 100 UNIT/ML IJ SOLN
0.0000 [IU] | Freq: Every day | INTRAMUSCULAR | Status: DC
  Administered 2023-02-12: 2 [IU] via SUBCUTANEOUS
  Administered 2023-02-13 – 2023-02-14 (×2): 3 [IU] via SUBCUTANEOUS
  Administered 2023-02-15: 2 [IU] via SUBCUTANEOUS
  Administered 2023-02-16: 3 [IU] via SUBCUTANEOUS
  Administered 2023-02-17: 2 [IU] via SUBCUTANEOUS
  Administered 2023-02-18: 3 [IU] via SUBCUTANEOUS
  Administered 2023-02-19: 2 [IU] via SUBCUTANEOUS
  Administered 2023-02-20: 4 [IU] via SUBCUTANEOUS
  Administered 2023-02-23: 2 [IU] via SUBCUTANEOUS
  Administered 2023-02-24: 3 [IU] via SUBCUTANEOUS
  Administered 2023-02-27: 2 [IU] via SUBCUTANEOUS

## 2023-02-10 MED ORDER — IPRATROPIUM-ALBUTEROL 0.5-2.5 (3) MG/3ML IN SOLN
3.0000 mL | RESPIRATORY_TRACT | Status: DC | PRN
  Administered 2023-02-11 – 2023-02-24 (×4): 3 mL via RESPIRATORY_TRACT
  Filled 2023-02-10 (×5): qty 3

## 2023-02-10 MED ORDER — FLUTICASONE FUROATE-VILANTEROL 200-25 MCG/ACT IN AEPB
1.0000 | INHALATION_SPRAY | Freq: Every day | RESPIRATORY_TRACT | Status: DC
  Administered 2023-02-11 – 2023-03-04 (×18): 1 via RESPIRATORY_TRACT
  Filled 2023-02-10 (×2): qty 28

## 2023-02-10 MED ORDER — INSULIN ASPART 100 UNIT/ML IJ SOLN
0.0000 [IU] | Freq: Three times a day (TID) | INTRAMUSCULAR | Status: DC
  Administered 2023-02-11 – 2023-02-12 (×3): 2 [IU] via SUBCUTANEOUS
  Administered 2023-02-12: 1 [IU] via SUBCUTANEOUS
  Administered 2023-02-12: 2 [IU] via SUBCUTANEOUS
  Administered 2023-02-13: 3 [IU] via SUBCUTANEOUS
  Administered 2023-02-13 (×2): 1 [IU] via SUBCUTANEOUS
  Administered 2023-02-14: 7 [IU] via SUBCUTANEOUS
  Administered 2023-02-14: 3 [IU] via SUBCUTANEOUS
  Administered 2023-02-14: 7 [IU] via SUBCUTANEOUS
  Administered 2023-02-15: 3 [IU] via SUBCUTANEOUS
  Administered 2023-02-15: 7 [IU] via SUBCUTANEOUS
  Administered 2023-02-15: 9 [IU] via SUBCUTANEOUS
  Administered 2023-02-16: 7 [IU] via SUBCUTANEOUS
  Administered 2023-02-16 – 2023-02-17 (×3): 3 [IU] via SUBCUTANEOUS
  Administered 2023-02-17 – 2023-02-18 (×4): 2 [IU] via SUBCUTANEOUS
  Administered 2023-02-18: 7 [IU] via SUBCUTANEOUS
  Administered 2023-02-19: 9 [IU] via SUBCUTANEOUS
  Administered 2023-02-19 (×2): 1 [IU] via SUBCUTANEOUS
  Administered 2023-02-20: 9 [IU] via SUBCUTANEOUS
  Administered 2023-02-20: 5 [IU] via SUBCUTANEOUS
  Administered 2023-02-20: 3 [IU] via SUBCUTANEOUS
  Administered 2023-02-21: 7 [IU] via SUBCUTANEOUS
  Administered 2023-02-21 (×2): 3 [IU] via SUBCUTANEOUS
  Administered 2023-02-22 – 2023-02-23 (×4): 2 [IU] via SUBCUTANEOUS
  Administered 2023-02-24: 3 [IU] via SUBCUTANEOUS
  Administered 2023-02-25: 5 [IU] via SUBCUTANEOUS
  Administered 2023-02-25 (×2): 1 [IU] via SUBCUTANEOUS
  Administered 2023-02-26: 2 [IU] via SUBCUTANEOUS
  Administered 2023-02-26: 1 [IU] via SUBCUTANEOUS
  Administered 2023-02-26: 3 [IU] via SUBCUTANEOUS
  Administered 2023-02-27: 5 [IU] via SUBCUTANEOUS
  Administered 2023-02-27: 1 [IU] via SUBCUTANEOUS
  Administered 2023-02-27: 3 [IU] via SUBCUTANEOUS
  Administered 2023-02-28 (×2): 2 [IU] via SUBCUTANEOUS
  Administered 2023-03-01 – 2023-03-02 (×2): 3 [IU] via SUBCUTANEOUS
  Administered 2023-03-02: 1 [IU] via SUBCUTANEOUS
  Administered 2023-03-02: 2 [IU] via SUBCUTANEOUS
  Administered 2023-03-03: 1 [IU] via SUBCUTANEOUS
  Administered 2023-03-03: 2 [IU] via SUBCUTANEOUS
  Administered 2023-03-04: 7 [IU] via SUBCUTANEOUS

## 2023-02-10 MED ORDER — ROSUVASTATIN CALCIUM 20 MG PO TABS
20.0000 mg | ORAL_TABLET | Freq: Every day | ORAL | Status: DC
  Administered 2023-02-11 – 2023-03-03 (×22): 20 mg via ORAL
  Filled 2023-02-10 (×22): qty 1

## 2023-02-10 MED ORDER — DAPAGLIFLOZIN PROPANEDIOL 10 MG PO TABS
10.0000 mg | ORAL_TABLET | Freq: Every day | ORAL | Status: DC
  Administered 2023-02-11 – 2023-02-15 (×5): 10 mg via ORAL
  Filled 2023-02-10 (×5): qty 1

## 2023-02-10 MED ORDER — HEPARIN (PORCINE) 25000 UT/250ML-% IV SOLN
1100.0000 [IU]/h | INTRAVENOUS | Status: DC
  Administered 2023-02-10: 1250 [IU]/h via INTRAVENOUS
  Administered 2023-02-11: 1100 [IU]/h via INTRAVENOUS
  Filled 2023-02-10 (×2): qty 250

## 2023-02-10 MED ORDER — INSULIN GLARGINE-YFGN 100 UNIT/ML ~~LOC~~ SOLN
10.0000 [IU] | Freq: Every day | SUBCUTANEOUS | Status: DC
  Administered 2023-02-11 – 2023-02-15 (×5): 10 [IU] via SUBCUTANEOUS
  Filled 2023-02-10 (×5): qty 0.1

## 2023-02-10 MED ORDER — FUROSEMIDE 10 MG/ML IJ SOLN
80.0000 mg | Freq: Once | INTRAMUSCULAR | Status: AC
  Administered 2023-02-10: 80 mg via INTRAVENOUS
  Filled 2023-02-10: qty 8

## 2023-02-10 MED ORDER — AMIODARONE HCL 100 MG PO TABS
100.0000 mg | ORAL_TABLET | Freq: Every day | ORAL | Status: DC
  Administered 2023-02-11 – 2023-02-14 (×4): 100 mg via ORAL
  Filled 2023-02-10 (×4): qty 1

## 2023-02-10 MED ORDER — HYDRALAZINE HCL 25 MG PO TABS
25.0000 mg | ORAL_TABLET | Freq: Three times a day (TID) | ORAL | Status: DC
  Administered 2023-02-11: 25 mg via ORAL
  Filled 2023-02-10: qty 1

## 2023-02-10 MED ORDER — ISOSORBIDE MONONITRATE ER 30 MG PO TB24
30.0000 mg | ORAL_TABLET | Freq: Every day | ORAL | Status: DC
  Administered 2023-02-11 – 2023-02-15 (×5): 30 mg via ORAL
  Filled 2023-02-10 (×5): qty 1

## 2023-02-10 NOTE — Telephone Encounter (Signed)
Daughter called and stated he is continuing with swelling. He has had 3 pb weight gain overnight - 193 yesterday and 196 today. Some shortness of breath also noted.  She is considering ED, but wanted to call us first.

## 2023-02-10 NOTE — Subjective & Objective (Signed)
CC: worsening LE edema HPI: 78 year old male with a history of chronic combined systolic/diastolic heart failure with an EF of 35 to 40%.,  Type 2 diabetes on long-term insulin, CKD stage IIIa, hypertension, reflux, chronic hypoxic respiratory failure on home oxygen as needed, COPD, hyperlipidemia, history of atrial flutter on chronic Eliquis who presents from the ER due to worsening weight gain and shortness of breath.  Patient had  been admitted to the hospital on January 20, 2023.  And discharged on January 24, 2023.  He was diuresed approximately net -8.7 L.  When he was discharged from the hospital he weighed 182 pounds.  He was seen in cardiology clinic in follow-up.  He weighed 187 pounds.  Cardiology notation shows that patient gained around 5 pounds.  They wanted him to restart his Farxiga 10 mg daily and continued him on Lasix 20 mg 1 daily. There is no ACE or Entresto due to hyperkalemia. He was continued on hydralazine 3 times daily and Imdur. His beta-blockers were held due to his recent exacerbation. He did not have a heart catheterization due to his CKD. . For the last week and a half, he has been having increasing lower extremity edema.  He has been taking his Lasix.  His weight has steadily climbed.  He weighed himself today is 196 pounds.  On arrival to the ER, temp 97.9 heart rate 113 blood pressure 125/50 satting 94% on 2 L. His weight in the emergency department was 88.9 kilograms(195.5 lbs)  Patient given 80 mg of IV Lasix and transferred to [facility].  Cardiology has seen the patient and given another dose of 80 mg of Lasix and placed him on a heparin drip.  [facility] hospital[facility] contacted for admission.  Patient denies any leg pain.  He states that he does all the cooking at home.  Does not use any extra salt.  He stopped using Mrs. Dash seasoning due to hyperkalemia.  He drinks about 60 ounces of water a day.  Not more than this.  He takes his medications as  prescribed.

## 2023-02-10 NOTE — Assessment & Plan Note (Signed)
Continue crestor 20 mg 

## 2023-02-10 NOTE — Assessment & Plan Note (Addendum)
-  Uncontrolled T2DM with hyperglycemia.   Patient was treated with sliding scale insulin and  basal long-acting regimen Pre meal insulin 6 units.   Continue with statin therapy.

## 2023-02-10 NOTE — ED Notes (Signed)
Report called and given to Pt's IP RN receiving Pt at [facility].

## 2023-02-10 NOTE — Assessment & Plan Note (Addendum)
-  No clinical signs of exacerbation. -Continue current medical management.

## 2023-02-10 NOTE — Telephone Encounter (Signed)
I called his daughter back, but they are already checking into ED. They are having tests run to see if HF related or kidney she said.

## 2023-02-10 NOTE — Assessment & Plan Note (Signed)
Admit to telemetry bed. Cardiology consult reviewed and appreciated. Cardiology will be running the show with his diuresis and HTN management. TRH will mange his diabetes.

## 2023-02-10 NOTE — Assessment & Plan Note (Addendum)
04/29. Sp direct current cardioversion, returning to sinus rhythm.   Transitioned to oral amiodarone.  Anticoagulation with apixaban.   Epistaxis, has improved. Required packing per ENT.

## 2023-02-10 NOTE — ED Notes (Signed)
Called lab to add BNP, spoke with Roxanne.

## 2023-02-10 NOTE — ED Notes (Signed)
Quianna at [facility] will send transport to [facility] at [facility] [address]ABB(NS)

## 2023-02-10 NOTE — ED Triage Notes (Signed)
SOB, swelling to legs and abd x 5 days. Lasix was increased yesterday.

## 2023-02-10 NOTE — Assessment & Plan Note (Addendum)
Continue protonix 40mg daily.  

## 2023-02-10 NOTE — H&P (Signed)
History and Physical    [name]:[id number] DOB: 1946-05-20 DOA: 02/10/2023  DOS: the patient was seen and examined on 02/10/2023  PCP: [name], Adelfa Koh, MD   Patient coming from: Home  I have personally briefly reviewed patient's old medical records in [place] [facility]  CC: worsening LE edema HPI: 77 year old male with a history of chronic combined systolic/diastolic heart failure with an EF of 35 to 40%.,  Type 2 diabetes on long-term insulin, CKD stage IIIa, hypertension, reflux, chronic hypoxic respiratory failure on home oxygen as needed, COPD, hyperlipidemia, history of atrial flutter on chronic Eliquis who presents from the ER due to worsening weight gain and shortness of breath.  Patient had  been admitted to the hospital on January 20, 2023.  And discharged on January 24, 2023.  He was diuresed approximately net -8.7 L.  When he was discharged from the hospital he weighed 182 pounds.  He was seen in cardiology clinic in follow-up.  He weighed 187 pounds.  Cardiology notation shows that patient gained around 5 pounds.  They wanted him to restart his Farxiga 10 mg daily and continued him on Lasix 20 mg 1 daily. There is no ACE or Entresto due to hyperkalemia. He was continued on hydralazine 3 times daily and Imdur. His beta-blockers were held due to his recent exacerbation. He did not have a heart catheterization due to his CKD. . For the last week and a half, he has been having increasing lower extremity edema.  He has been taking his Lasix.  His weight has steadily climbed.  He weighed himself today is 196 pounds.  On arrival to the ER, temp 97.9 heart rate 113 blood pressure 125/50 satting 94% on 2 L. His weight in the emergency department was 88.9 kilograms(195.5 lbs)  Patient given 80 mg of IV Lasix and transferred to [facility].  Cardiology has seen the patient and given another dose of 80 mg of Lasix and placed him on a heparin drip.  [facility]  hospitalist contacted for admission.  Patient denies any leg pain.  He states that he does all the cooking at home.  Does not use any extra salt.  He stopped using Mrs. Dash seasoning due to hyperkalemia.  He drinks about 60 ounces of water a day.  Not more than this.  He takes his medications as prescribed.   ED Course: BNP 2508. On admission 01-20-2023, BNP was 1939. CXR small bilateral pleural effusions. Mild pulm edema   Review of Systems:  Review of Systems  Constitutional:  Positive for malaise/fatigue.  HENT: Negative.    Eyes: Negative.   Respiratory:  Positive for shortness of breath.   Cardiovascular:  Positive for leg swelling.  Gastrointestinal: Negative.   Genitourinary: Negative.   Musculoskeletal: Negative.   Skin: Negative.   Neurological: Negative.   Endo/Heme/Allergies: Negative.   Psychiatric/Behavioral: Negative.    All other systems reviewed and are negative.   Past Medical History:  Diagnosis Date   Acute hypoxemic respiratory failure 01/20/2023   Acute kidney injury superimposed on chronic kidney disease 10/19/2022   Acute on chronic systolic CHF (congestive heart failure) 01/21/2023   Arthritis    CAD (coronary artery disease)    Carotid artery occlusion    Diabetes mellitus    fasting 100-120s   Dizziness    Dysrhythmia    skips beats   GERD (gastroesophageal reflux disease)    History of kidney stones    Hyperlipidemia    Hypertension  IHD (ischemic heart disease)    Prior CABG in 2003   Normal nuclear stress test 03/2011   Peripheral vascular disease    Prostate cancer    Torn rotator cuff     Past Surgical History:  Procedure Laterality Date   ABDOMINAL AORTAGRAM N/A 12/21/2013   Procedure: ABDOMINAL Ronny Flurry;  Surgeon: Sherren Kerns, MD;  Location: [facility] CATH LAB;  Service: Cardiovascular;  Laterality: N/A;   ABDOMINAL AORTOGRAM N/A 11/13/2017   Procedure: ABDOMINAL AORTOGRAM;  Surgeon: Sherren Kerns, MD;  Location: [facility] INVASIVE CV  LAB;  Service: Cardiovascular;  Laterality: N/A;   APPENDECTOMY     ARCH AORTOGRAM  08/26/2013   Procedure: ARCH AORTOGRAM;  Surgeon: Sherren Kerns, MD;  Location: [facility] CATH LAB;  Service: Cardiovascular;;   CARDIAC CATHETERIZATION  05/03/2002   EF 40-45%   CARDIOVASCULAR STRESS TEST  08/10/2006   EF 65%   CARDIOVERSION N/A 10/22/2022   Procedure: CARDIOVERSION;  Surgeon: Dolores Patty, MD;  Location: [facility] ENDOSCOPY;  Service: Cardiovascular;  Laterality: N/A;   CAROTID ENDARTERECTOMY     CAROTID STENT INSERTION Left 08/26/2013   Procedure: CAROTID STENT INSERTION;  Surgeon: Sherren Kerns, MD;  Location: [facility] CATH LAB;  Service: Cardiovascular;  Laterality: Left;  internal carotid   CORONARY ARTERY BYPASS GRAFT  04/2002   DOPPLER ECHOCARDIOGRAPHY  08/12/2002   EF 80-85%   ENDARTERECTOMY Left 07/20/2013   Procedure: ATTEMPTED ENDARTERECTOMY CAROTID;  Surgeon: Fransisco Hertz, MD;  Location: [facility] OR;  Service: Vascular;  Laterality: Left;   HERNIA REPAIR     LOWER EXTREMITY ANGIOGRAPHY  11/13/2017   Procedure: Lower Extremity Angiography;  Surgeon: Sherren Kerns, MD;  Location: [facility] INVASIVE CV LAB;  Service: Cardiovascular;;   PERIPHERAL VASCULAR INTERVENTION Right 11/13/2017   Procedure: PERIPHERAL VASCULAR INTERVENTION;  Surgeon: Sherren Kerns, MD;  Location: [facility] INVASIVE CV LAB;  Service: Cardiovascular;  Laterality: Right;  superficial femoral   RIGHT HEART CATH N/A 09/23/2022   Procedure: RIGHT HEART CATH;  Surgeon: Dolores Patty, MD;  Location: [facility] INVASIVE CV LAB;  Service: Cardiovascular;  Laterality: N/A;   SHOULDER SURGERY     TEE WITHOUT CARDIOVERSION N/A 10/22/2022   Procedure: TRANSESOPHAGEAL ECHOCARDIOGRAM (TEE);  Surgeon: Dolores Patty, MD;  Location: [facility] ENDOSCOPY;  Service: Cardiovascular;  Laterality: N/A;     reports that he quit smoking about 21 years ago. His smoking use included cigarettes. He has a 20.00 pack-year smoking history. He has never been  exposed to tobacco smoke. He has never used smokeless tobacco. He reports that he does not drink alcohol and does not use drugs.  Allergies  Allergen Reactions   Aldactone [Spironolactone] Other (See Comments)    Swollen breast   Lipitor [Atorvastatin Calcium] Other (See Comments)    Muscle ache   Codeine Rash   Sulfa Drugs Cross Reactors Other (See Comments)    "raw spots"   Sulfamethoxazole-Trimethoprim Rash    Other reaction(s): Rash    Family History  Problem Relation Age of Onset   Heart attack Mother    Diabetes Mother    Heart disease Mother    Hypertension Mother    Heart attack Father    Heart disease Father    Hypertension Father    Diabetes Brother    Diabetes Sister    Diabetes Daughter    Heart disease Daughter    Hypertension Daughter    Diabetes Sister    Breast cancer Neg Hx    Prostate cancer  Neg Hx    Colon cancer Neg Hx    Pancreatic cancer Neg Hx     Prior to Admission medications   Medication Sig Start Date End Date Taking? Authorizing Provider  albuterol (VENTOLIN HFA) 108 (90 Base) MCG/ACT inhaler Inhale 2 puffs into the lungs every 6 (six) hours as needed for wheezing or shortness of breath. 07/02/21  Yes [provider]  amiodarone (PACERONE) 200 MG tablet Take 0.5 tablets (100 mg total) by mouth daily. 11/07/22  Yes Clegg, [name] D, NP  amLODipine (NORVASC) 10 MG tablet Take 1 tablet (10 mg total) by mouth daily. 01/02/23  Yes Bensimhon, Bevelyn Buckles, MD  apixaban (ELIQUIS) 5 MG TABS tablet Take 1 tablet (5 mg total) by mouth 2 (two) times daily. 10/07/22  Yes Milford, Anderson Malta, FNP  Budeson-Glycopyrrol-Formoterol (BREZTRI AEROSPHERE) 160-9-4.8 MCG/ACT AERO Inhale 2 puffs into the lungs in the morning and at bedtime. 01/26/23  Yes Omar Person, MD  Cholecalciferol (VITAMIN D-3) 125 MCG (5000 UT) TABS Take 5,000 Units by mouth daily.   Yes [provider]  cyanocobalamin (VITAMIN B12) 1000 MCG tablet Take 1,000 mcg by mouth daily.   Yes  [provider]  dapagliflozin propanediol (FARXIGA) 10 MG TABS tablet Take 1 tablet (10 mg total) by mouth daily before breakfast. 02/02/23  Yes Milford, Anderson Malta, FNP  furosemide (LASIX) 40 MG tablet Take 0.5 tablets (20 mg total) by mouth daily. Patient taking differently: Take 20 mg by mouth 2 (two) times daily. 20 mg every morning and 20 mg every evening 01/24/23  Yes Zannie Cove, MD  hydrALAZINE (APRESOLINE) 50 MG tablet Take 0.5 tablets (25 mg total) by mouth 3 (three) times daily. 01/24/23  Yes Zannie Cove, MD  insulin NPH Human (NOVOLIN N) 100 UNIT/ML injection Inject 35-40 Units into the skin See admin instructions. Inject 40 units into the skin in the morning and 35 units at night 09/08/19  Yes [provider]  insulin regular (NOVOLIN R) 100 units/mL injection Inject 6-15 Units into the skin 3 (three) times daily before meals. Per sliding scale 09/08/19  Yes [provider]  isosorbide mononitrate (IMDUR) 30 MG 24 hr tablet Take 1 tablet (30 mg total) by mouth daily. 12/04/22  Yes Bensimhon, Bevelyn Buckles, MD  LINZESS 72 MCG capsule Take 72 mcg by mouth every morning. 02/09/23  Yes [provider]  pantoprazole (PROTONIX) 40 MG tablet Take 40 mg by mouth daily.   Yes [provider]  potassium chloride (KLOR-CON M) 10 MEQ tablet Take 1 tablet (10 mEq total) by mouth 2 (two) times daily. Patient taking differently: Take 10 mEq by mouth daily. 02/09/23  Yes Milford, Anderson Malta, FNP  potassium chloride (KLOR-CON) 10 MEQ tablet Take 10 mEq by mouth daily. 02/09/23  Yes [provider]  rosuvastatin (CRESTOR) 20 MG tablet Take 1 tablet by mouth once daily Patient taking differently: Take 20 mg by mouth at bedtime. 08/18/22  Yes Nahser, Deloris Ping, MD  nitroGLYCERIN (NITROSTAT) 0.4 MG SL tablet Place 1 tablet (0.4 mg total) under the tongue every 5 (five) minutes as needed for chest pain. 08/31/20   Nahser, Deloris Ping, MD    Physical Exam: Vitals:    02/10/23 1602 02/10/23 1855 02/10/23 1910 02/10/23 2014  BP:   129/82 134/62  Pulse:   86 73  Resp:  Temp: 98.2 F (36.8 C) 97.9 F (36.6 C) 97.9 F (36.6 C) 97.7 F (36.5 C)  TempSrc: Oral Oral Oral  Oral  SpO2:   92% 94%  Weight:   88.9 kg   Height:  5\' 10"  (1.778 m) 5\' 10"  (1.778 m)     Physical Exam Vitals and nursing note reviewed.  Constitutional:      General: He is not in acute distress.    Appearance: Normal appearance. He is not toxic-appearing or diaphoretic.  HENT:     Head: Normocephalic and atraumatic.     Nose: Nose normal.  Cardiovascular:     Rate and Rhythm: Normal rate and regular rhythm.     Comments: JVD to angle of jaw sitting upright 90 degress Pulmonary:     Effort: No respiratory distress.     Breath sounds: Examination of the right-lower field reveals rales. Examination of the left-lower field reveals rales. Rales present.     Comments: Rales L base > right base Abdominal:     General: Bowel sounds are normal. There is no distension.     Palpations: Abdomen is soft.     Tenderness: There is no abdominal tenderness.  Musculoskeletal:     Right lower leg: 2+ Edema present.     Left lower leg: 2+ Edema present.     Comments: +2 pitting bilateral lower leg edema up to knees +1 pitting posterior thigh edema bilaterally  Skin:    General: Skin is warm and dry.     Capillary Refill: Capillary refill takes less than 2 seconds.  Neurological:     General: No focal deficit present.     Mental Status: He is alert and oriented to person, place, and time.      Labs on Admission: I have personally reviewed following labs and imaging studies  CBC: Recent Labs  Lab 02/10/23 1302  WBC 5.9  HGB 11.8*  HCT 38.0*  MCV 88.8  PLT 184   Basic Metabolic Panel: Recent Labs  Lab 02/10/23 1302 02/10/23 2007  NA 135 134*  K 4.0 3.8  CL 102 100  CO2 24 22  GLUCOSE 169* 69*  BUN 49* 45*  CREATININE 2.32* 2.31*  CALCIUM 8.8* 8.8*    GFR: Estimated Creatinine Clearance: 31 mL/min (A) (by C-G formula based on SCr of 2.31 mg/dL (H)). Liver Function Tests: Recent Labs  Lab 02/10/23 2007  AST 30  ALT 26  ALKPHOS 86  BILITOT 0.7  PROT 6.8  ALBUMIN 3.4*   No results for input(s): "LIPASE", "AMYLASE" in the last 168 hours. No results for input(s): "AMMONIA" in the last 168 hours. Coagulation Profile: No results for input(s): "INR", "PROTIME" in the last 168 hours. Cardiac Enzymes: No results for input(s): "CKTOTAL", "CKMB", "CKMBINDEX", "TROPONINI", "TROPONINIHS" in the last 168 hours. BNP (last 3 results) Recent Labs    01/20/23 1357 02/02/23 1130 02/10/23 1302  BNP 1,939.2* 2,275.8* 2,508.7*   HbA1C: No results for input(s): "HGBA1C" in the last 72 hours. CBG: No results for input(s): "GLUCAP" in the last 168 hours. Lipid Profile: No results for input(s): "CHOL", "HDL", "LDLCALC", "TRIG", "CHOLHDL", "LDLDIRECT" in the last 72 hours. Thyroid Function Tests: No results for input(s): "TSH", "T4TOTAL", "FREET4", "T3FREE", "THYROIDAB" in the last 72 hours. Anemia Panel: Recent Labs    02/10/23 2007  VITAMINB12 682  FOLATE 16.1  FERRITIN 48  TIBC 374  IRON 24*  RETICCTPCT 2.5   Urine analysis:    Component Value Date/Time   COLORURINE STRAW (A) 10/19/2022 0628   APPEARANCEUR CLEAR 10/19/2022 0628   LABSPEC 1.009 10/19/2022 0628   PHURINE 5.0 10/19/2022 1610  GLUCOSEU >=500 (A) 10/19/2022 0628   HGBUR NEGATIVE 10/19/2022 0628   BILIRUBINUR NEGATIVE 10/19/2022 0628   KETONESUR NEGATIVE 10/19/2022 0628   PROTEINUR NEGATIVE 10/19/2022 0628   UROBILINOGEN 1.0 07/19/2013 1223   NITRITE NEGATIVE 10/19/2022 0628   LEUKOCYTESUR NEGATIVE 10/19/2022 1610    Radiological Exams on Admission: I have personally reviewed images DG Chest 2 View  Result Date: 02/10/2023 CLINICAL DATA:  Shortness of breath, swelling, lower extremity edema EXAM: CHEST - 2 VIEW COMPARISON:  01/20/2023 FINDINGS: Status post  median sternotomy. Unchanged cardiac and mediastinal contours. Small bilateral pleural effusions, which appear increased from the prior exam. Mild diffuse interstitial opacities, similar to prior. No acute osseous abnormality. No evidence of pneumothorax. IMPRESSION: 1. Small bilateral pleural effusions, which appear increased from the prior exam. 2. Mild diffuse interstitial opacities, similar to prior, likely pulmonary edema. Electronically Signed   By: Wiliam Ke M.D.   On: 02/10/2023 12:48    EKG: My personal interpretation of EKG shows: NSR, IVCD    Assessment/Plan Principal Problem:   Acute on chronic combined systolic (congestive) and diastolic (congestive) heart failure Active Problems:   Hyperlipidemia associated with type 2 diabetes mellitus   GERD (gastroesophageal reflux disease)   Insulin dependent type 2 diabetes mellitus   Atrial flutter   COPD (chronic obstructive pulmonary disease)   CKD stage 3a, GFR 45-59 ml/min   Chronic respiratory failure with hypoxia, on home O2 therapy - on 2 L/min PRN. has concentrator at home.   DNR (do not resuscitate)    Assessment and Plan: * Acute on chronic combined systolic (congestive) and diastolic (congestive) heart failure Admit to telemetry bed. Cardiology consult reviewed and appreciated. Cardiology will be running the show with his diuresis and HTN management. TRH will mange his diabetes.  Chronic respiratory failure with hypoxia, on home O2 therapy - on 2 L/min PRN. has concentrator at home. Continue with supplemental O2 as needed.  CKD stage 3a, GFR 45-59 ml/min Stable. Monitor Scr during diuresis.  COPD (chronic obstructive pulmonary disease) Stable. Prn nebs. Not currently exacerbated.  Atrial flutter In NSR. On Eliquis at home. Cardiology has started IV heparin gtts. Continue amiodarone per cards.  Insulin dependent type 2 diabetes mellitus Continue farxiga per cards note. Add ssi. Change to lantus for longer  coverage.  GERD (gastroesophageal reflux disease) Continue protonix 40 mg daily.  Hyperlipidemia associated with type 2 diabetes mellitus Continue crestor 20 mg  DNR (do not resuscitate) Verified pt is a DNR. Pt is emphatic that he is a DNR/DNI.   DVT prophylaxis: IV heparin gtts Code Status: DNR/DNI(Do NOT Intubate). Verified with pt that he wants to be DNR. He was very emphatic that he is a DNR/DNI. Family Communication: no family at bedside  Disposition Plan: return home  Consults called: cardiology has already seen the patient  Admission status: Observation, Telemetry bed   Carollee Herter, DO [facility] 02/10/2023, 11:11 PM

## 2023-02-10 NOTE — ED Notes (Signed)
Patient transported to X-ray 

## 2023-02-10 NOTE — Assessment & Plan Note (Addendum)
-  Verified pt is a DNR. Pt is emphatic that he is a DNR/DNI. -Daughter at bedside also confirming code Status.

## 2023-02-10 NOTE — ED Provider Notes (Signed)
[place] EMERGENCY DEPARTMENT AT [facility] Provider Note   CSN: [id number] Arrival date & time: 02/10/23  1146     History  Chief Complaint  Patient presents with   Shortness of Breath    [name] is a 77 y.o. male.  HPI     77 yo male chf,  recent admission for chf d/c'd 2 weeks ago.  Taking meds as rx'd lasix to .  Weight increased by 20 lbs.  Now with increased dyspnea and leg swelling.  NO pain, fevers.  Home Medications Prior to Admission medications   Medication Sig Start Date End Date Taking? Authorizing Provider  albuterol (VENTOLIN HFA) 108 (90 Base) MCG/ACT inhaler Inhale 2 puffs into the lungs every 6 (six) hours as needed for wheezing or shortness of breath. 07/02/21  Yes [provider]  amiodarone (PACERONE) 200 MG tablet Take 0.5 tablets (100 mg total) by mouth daily. 11/07/22  Yes Clegg, [name] D, NP  amLODipine (NORVASC) 10 MG tablet Take 1 tablet (10 mg total) by mouth daily. 01/02/23  Yes Bensimhon, Bevelyn Buckles, MD  apixaban (ELIQUIS) 5 MG TABS tablet Take 1 tablet (5 mg total) by mouth 2 (two) times daily. 10/07/22  Yes Milford, Anderson Malta, FNP  Budeson-Glycopyrrol-Formoterol (BREZTRI AEROSPHERE) 160-9-4.8 MCG/ACT AERO Inhale 2 puffs into the lungs in the morning and at bedtime. 01/26/23  Yes Omar Person, MD  Cholecalciferol (VITAMIN D-3) 125 MCG (5000 UT) TABS Take 5,000 Units by mouth daily.   Yes [provider]  cyanocobalamin (VITAMIN B12) 1000 MCG tablet Take 1,000 mcg by mouth daily.   Yes [provider]  dapagliflozin propanediol (FARXIGA) 10 MG TABS tablet Take 1 tablet (10 mg total) by mouth daily before breakfast. 02/02/23  Yes Milford, Anderson Malta, FNP  furosemide (LASIX) 40 MG tablet Take 0.5 tablets (20 mg total) by mouth daily. Patient taking differently: Take 20 mg by mouth 2 (two) times daily. 20 mg every morning and 20 mg every evening 01/24/23  Yes Zannie Cove, MD  hydrALAZINE (APRESOLINE) 50 MG  tablet Take 0.5 tablets (25 mg total) by mouth 3 (three) times daily. 01/24/23  Yes Zannie Cove, MD  insulin NPH Human (NOVOLIN N) 100 UNIT/ML injection Inject 35-40 Units into the skin See admin instructions. Inject 40 units into the skin in the morning and 35 units at night 09/08/19  Yes [provider]  insulin regular (NOVOLIN R) 100 units/mL injection Inject 6-15 Units into the skin 3 (three) times daily before meals. Per sliding scale 09/08/19  Yes [provider]  isosorbide mononitrate (IMDUR) 30 MG 24 hr tablet Take 1 tablet (30 mg total) by mouth daily. 12/04/22  Yes Bensimhon, Bevelyn Buckles, MD  LINZESS 72 MCG capsule Take 72 mcg by mouth every morning. 02/09/23  Yes [provider]  pantoprazole (PROTONIX) 40 MG tablet Take 40 mg by mouth daily.   Yes [provider]  potassium chloride (KLOR-CON M) 10 MEQ tablet Take 1 tablet (10 mEq total) by mouth 2 (two) times daily. Patient taking differently: Take 10 mEq by mouth daily. 02/09/23  Yes Milford, Anderson Malta, FNP  potassium chloride (KLOR-CON) 10 MEQ tablet Take 10 mEq by mouth daily. 02/09/23  Yes [provider]  rosuvastatin (CRESTOR) 20 MG tablet Take 1 tablet by mouth once daily Patient taking differently: Take 20 mg by mouth at bedtime. 08/18/22  Yes Nahser, Deloris Ping, MD  nitroGLYCERIN (NITROSTAT) 0.4 MG SL tablet Place 1 tablet (0.4 mg total) under  the tongue every 5 (five) minutes as needed for chest pain. 08/31/20   Nahser, Deloris Ping, MD      Allergies    Aldactone [spironolactone], Lipitor [atorvastatin calcium], Sulfa drugs cross reactors, Sulfamethoxazole-trimethoprim, and Codeine    Review of Systems   Review of Systems  Physical Exam Updated Vital Signs BP 133/69   Pulse 80   Temp 97.9 F (36.6 C) (Oral)   Resp 20   Ht 1.778 m ( )   Wt 88.9 kg   SpO2 91%   BMI 28.12 kg/m  Physical Exam Vitals reviewed.  Constitutional:      General: He is not in acute distress.     Appearance: He is well-developed. He is ill-appearing.  HENT:     Head: Normocephalic.     Mouth/Throat:     Pharynx: Oropharynx is clear.  Eyes:     Pupils: Pupils are equal, round, and reactive to light.  Cardiovascular:     Rate and Rhythm: Regular rhythm. Tachycardia present.  Pulmonary:     Effort: Tachypnea present.     Breath sounds: Examination of the right-lower field reveals decreased breath sounds. Examination of the left-lower field reveals decreased breath sounds. Decreased breath sounds present.  Abdominal:     General: Bowel sounds are normal.     Comments: Diffuse distention  Musculoskeletal:     Cervical back: Normal range of motion.     Right lower leg: Edema present.     Left lower leg: Edema present.  Skin:    General: Skin is warm and dry.  Neurological:     General: No focal deficit present.     Mental Status: He is alert.  Psychiatric:        Mood and Affect: Mood normal.     ED Results / Procedures / Treatments   Labs (all labs ordered are listed, but only abnormal results are displayed) Labs Reviewed  BASIC METABOLIC PANEL - Abnormal; Notable for the following components:      Result Value   Glucose, Bld 169 (*)    BUN 49 (*)    Creatinine, Ser 2.32 (*)    Calcium 8.8 (*)    GFR, Estimated 29 (*)    All other components within normal limits  CBC - Abnormal; Notable for the following components:   Hemoglobin 11.8 (*)    HCT 38.0 (*)    RDW 20.8 (*)    All other components within normal limits  BRAIN NATRIURETIC PEPTIDE - Abnormal; Notable for the following components:   B Natriuretic Peptide 2,508.7 (*)    All other components within normal limits    EKG EKG Interpretation  Date/Time:  Tuesday February 10 2023 12:14:27 EDT Ventricular Rate:  82 PR Interval:  263 QRS Duration: 170 QT Interval:  426 QTC Calculation: 498 R Axis:   146 Text Interpretation: Sinus rhythm Prolonged PR interval Nonspecific intraventricular conduction delay  Probable anteroseptal infarct, old Confirmed by Margarita Grizzle [phone]) on 02/10/2023 12:57:42 PM  Radiology DG Chest 2 View  Result Date: 02/10/2023 CLINICAL DATA:  Shortness of breath, swelling, lower extremity edema EXAM: CHEST - 2 VIEW COMPARISON:  01/20/2023 FINDINGS: Status post median sternotomy. Unchanged cardiac and mediastinal contours. Small bilateral pleural effusions, which appear increased from the prior exam. Mild diffuse interstitial opacities, similar to prior. No acute osseous abnormality. No evidence of pneumothorax. IMPRESSION: 1. Small bilateral pleural effusions, which appear increased from the prior exam. 2. Mild diffuse interstitial opacities, similar to prior, likely  pulmonary edema. Electronically Signed   By: Wiliam Ke M.D.   On: 02/10/2023 12:48    Procedures .Critical Care  Performed by: Margarita Grizzle, MD Authorized by: Margarita Grizzle, MD   Critical care provider statement:    Critical care time (minutes):  45   Critical care was necessary to treat or prevent imminent or life-threatening deterioration of the following conditions:  Cardiac failure   Critical care was time spent personally by me on the following activities:  Development of treatment plan with patient or surrogate, discussions with consultants, evaluation of patient's response to treatment, examination of patient, ordering and review of laboratory studies, ordering and review of radiographic studies, ordering and performing treatments and interventions, pulse oximetry, re-evaluation of patient's condition and review of old charts     Medications Ordered in ED Medications  furosemide (LASIX) injection 80 mg (80 mg Intravenous Given 02/10/23 1313)    ED Course/ Medical Decision Making/ A&P Clinical Course as of 02/10/23 1456  Tue Feb 10, 2023  1336 Patient metabolic panel reviewed interpreted significant for elevated BUN and creatinine of 49 and 2.32 with creatinine fairly stable from last  several Glucose is elevated at 69 [DR]  1336 X-Lourene Hoston reviewed interpreted significant for small bilateral pleural effusions and mild diffuse interstitial opacities likely pulmonary edema radiologist interpretation concurs [DR]  1444 Brain natriuretic peptide(!) [SK]    Clinical Course User Index [DR] Margarita Grizzle, MD [SK] Sherlyn Lick, Student-PA                             Medical Decision Making Amount and/or Complexity of Data Reviewed Labs: ordered. Radiology: ordered.  Risk Prescription drug management. Decision regarding hospitalization.   77 year old male history of CHF with recent hospitalization for same presents today with 20 pound weight gain and increased dyspnea with desaturations on his home oxygen of 2 L.  Has known CKD and creatinine is stable.  He has been taking his home medications which have been increased to 40 Lasix a day.  He denies any fever or chills Differential diagnosis includes but is not limited to worsening congestive heart failure, acute coronary syndrome, infection, COPD exacerbation Patient evaluated here with chest x-Kyna Blahnik, CBC, be met.  EKG without acutely ischemic changes and normal rhythm Chest x-Nael Petrosyan shows increased markings consistent with pulmonary edema Patient is given Lasix 80 mg IV here in the ED. Plan consult to cardiology for further evaluation and treatment Discussed with Dr. Elease Hashimoto on for cardiology Discussed with Dr. Alinda Money on call for hospitalist will accept        Final Clinical Impression(s) / ED Diagnoses Final diagnoses:  Acute on chronic congestive heart failure, unspecified heart failure type    Rx / DC Orders ED Discharge Orders     None         Margarita Grizzle, MD 02/10/23 1456

## 2023-02-10 NOTE — Progress Notes (Addendum)
ANTICOAGULATION CONSULT NOTE - Initial Consult  Pharmacy Consult for Eliquis >> IV heparin Indication: atrial fibrillation  Allergies  Allergen Reactions   Aldactone [Spironolactone] Other (See Comments)    Swollen breast   Lipitor [Atorvastatin Calcium] Other (See Comments)    Muscle ache   Sulfa Drugs Cross Reactors Other (See Comments)    "raw spots"   Sulfamethoxazole-Trimethoprim     Other reaction(s): Rash   Codeine Rash    Patient Measurements: Height: 5\' 10"  (177.8 cm) Weight: 88.9 kg (195 lb 15.8 oz) IBW/kg (Calculated) : 73 Heparin Dosing Weight: 88.9kg  Vital Signs: Temp: 97.9 F (36.6 C) (04/16 1910) Temp Source: Oral (04/16 1910) BP: 129/82 (04/16 1910) Pulse Rate: 86 (04/16 1910)  Labs: Recent Labs    02/10/23 1302  HGB 11.8*  HCT 38.0*  PLT 184  CREATININE 2.32*    Estimated Creatinine Clearance: 30.9 mL/min (A) (by C-G formula based on SCr of 2.32 mg/dL (H)).   Medical History: Past Medical History:  Diagnosis Date   Acute hypoxemic respiratory failure 01/20/2023   Acute kidney injury superimposed on chronic kidney disease 10/19/2022   Acute on chronic systolic CHF (congestive heart failure) 01/21/2023   Arthritis    CAD (coronary artery disease)    Carotid artery occlusion    Diabetes mellitus    fasting 100-120s   Dizziness    Dysrhythmia    skips beats   GERD (gastroesophageal reflux disease)    History of kidney stones    Hyperlipidemia    Hypertension    IHD (ischemic heart disease)    Prior CABG in 2003   Normal nuclear stress test 03/2011   Peripheral vascular disease    Prostate cancer    Torn rotator cuff     Medications:  Scheduled:   furosemide  80 mg Intravenous Once    Assessment: 77 yo male w/ PMH Afib on Eliquis PTA - last dose 4/16 @ 0730 AM. Potential need for RHC so cardiology switching Eliquis to IV heparin.   Baseline aPTT and heparin level ordered  Goal of Therapy:  Heparin level 0.3-0.7  units/ml aPTT 66-102 seconds Monitor platelets by anticoagulation protocol: Yes   Plan:  Begin heparin drip at 1250 units/hr tonight No bolus since recent Eliquis admin aPTT 8 hours after drip initiation Daily heparin level, aPTT, and CBC F/u plans for RHC and ability to transition back to Eliquis  Rexford Maus, PharmD, BCPS 02/10/2023 7:27 PM

## 2023-02-10 NOTE — Assessment & Plan Note (Signed)
Continue with supplemental O2 as needed.

## 2023-02-10 NOTE — Assessment & Plan Note (Addendum)
-  CKD stage 3a at baseline Hyponatremia. Hypokalemia.  Volume status is improving. Renal function with serum cr at 2,77 with K at 3,4 and serum bicarbonate at 24. Na 132. Mg 2.2   Plan to continue diuresis (furosemide and metolazone) and K supplementation.  Follow up renal function in am.

## 2023-02-10 NOTE — Consult Note (Signed)
Cardiology Consult:   Patient ID: [name]; MRN: [id number]; DOB: Apr 01, 1946   Admission date: 02/10/2023  Primary Care Provider: Gaspar Garbe, MD Primary Cardiologist: Dr. Teressa Lower  Chief Complaint:  SOB and LE edema (acute on chronic decompensated heart failure  Patient Profile:   [name] is a 77 y.o. male with a history of CAD (s/p CABG in 2003 [place] to LAD, SVG to diag, VG to LCx marginal, seq VG to PDA and PLA [with potential graft failure]), CAS  PAD s/p R SFA and Popliteal stens, HLD, THM with DM, with HFrEF (EF 30-35%, Mod AI, MR, Mild RV dysfunction), CKD stage IIIa, AFL s/p 10/22/2022 DCCV.  He presents for worsening SOB  History of Present Illness:   Mr. [name] had been feeling well in 02/02/2023.  At discharge, we left on lasix  40 mg PRN due to AKI.  At that time we have no symptoms.  And felt well.  At his 02/02/2023 visit he had a 7 lbs weight gain.  At this time he was restarted on his SGLT2i.  At that visit he had noted some abdominal tightness but otherwise was at baseline.  He notes that since that visit he has gained 20 lbs of weight.  Had an additional 3 lbs weight gain overnight and called in.  This, coupled with worsening swelling and new SOB at rest prompted ED visit.  Has had no chest pain, chest pressure, chest tightness, chest stinging .  He has had no chest pain since in 2003 CABG.  No change in diet.  He cooks his own food, he has had no dietary indiscretions.    Has noted worsening abdominal swelling.  No syncope or near syncope . Notes  no palpitations or funny heart beats.   In talking with him, it does not appear he was symptomatic of his prior atrial flutter.  He has been compliant with his medications.  He notes his painful gynecomastia on spironolactone.  We discussed his CKD; he is not planned for dialysis any time soon.  In the ED - creatinine 2.23 with K of 4.  BNP 2509 HS troponin 714-> 547 - given 80 IV lasix at ~1 pm.  - Hgb  11.8 and stable  Past Medical History:  Diagnosis Date   Acute hypoxemic respiratory failure 01/20/2023   Acute kidney injury superimposed on chronic kidney disease 10/19/2022   Acute on chronic systolic CHF (congestive heart failure) 01/21/2023   Arthritis    CAD (coronary artery disease)    Carotid artery occlusion    Diabetes mellitus    fasting 100-120s   Dizziness    Dysrhythmia    skips beats   GERD (gastroesophageal reflux disease)    History of kidney stones    Hyperlipidemia    Hypertension    IHD (ischemic heart disease)    Prior CABG in 2003   Normal nuclear stress test 03/2011   Peripheral vascular disease    Prostate cancer    Torn rotator cuff     Past Surgical History:  Procedure Laterality Date   ABDOMINAL AORTAGRAM N/A 12/21/2013   Procedure: ABDOMINAL Ronny Flurry;  Surgeon: Sherren Kerns, MD;  Location[facility] CATH LAB;  Service: Cardiovascular;  Laterality: N/A;   ABDOMINAL AORTOGRAM N/A 11/13/2017   Procedure: ABDOMINAL AORTOGRAM;  Surgeon: Sherren Kerns, MD;  Location: [facility] INVASIVE CV LAB;  Service: Cardiovascular;  Laterality: N/A;   APPENDECTOMY     ARCH AORTOGRAM  08/26/2013  Procedure: ARCH AORTOGRAM;  Surgeon: Sherren Kerns, MD;  Location: [facility] CATH LAB;  Service: Cardiovascular;;   CARDIAC CATHETERIZATION  05/03/2002   EF 40-45%   CARDIOVASCULAR STRESS TEST  08/10/2006   EF 65%   CARDIOVERSION N/A 10/22/2022   Procedure: CARDIOVERSION;  Surgeon: Dolores Patty, MD;  Location: [facility] ENDOSCOPY;  Service: Cardiovascular;  Laterality: N/A;   CAROTID ENDARTERECTOMY     CAROTID STENT INSERTION Left 08/26/2013   Procedure: CAROTID STENT INSERTION;  Surgeon: Sherren Kerns, MD;  Location: [facility] CATH LAB;  Service: Cardiovascular;  Laterality: Left;  internal carotid   CORONARY ARTERY BYPASS GRAFT  04/2002   DOPPLER ECHOCARDIOGRAPHY  08/12/2002   EF 80-85%   ENDARTERECTOMY Left 07/20/2013   Procedure: ATTEMPTED ENDARTERECTOMY CAROTID;  Surgeon: Fransisco Hertz, MD;  Location: [facility] OR;  Service: Vascular;  Laterality: Left;   HERNIA REPAIR     LOWER EXTREMITY ANGIOGRAPHY  11/13/2017   Procedure: Lower Extremity Angiography;  Surgeon: Sherren Kerns, MD;  Location: [facility] INVASIVE CV LAB;  Service: Cardiovascular;;   PERIPHERAL VASCULAR INTERVENTION Right 11/13/2017   Procedure: PERIPHERAL VASCULAR INTERVENTION;  Surgeon: Sherren Kerns, MD;  Location: [facility] INVASIVE CV LAB;  Service: Cardiovascular;  Laterality: Right;  superficial femoral   RIGHT HEART CATH N/A 09/23/2022   Procedure: RIGHT HEART CATH;  Surgeon: Dolores Patty, MD;  Location: [facility] INVASIVE CV LAB;  Service: Cardiovascular;  Laterality: N/A;   SHOULDER SURGERY     TEE WITHOUT CARDIOVERSION N/A 10/22/2022   Procedure: TRANSESOPHAGEAL ECHOCARDIOGRAM (TEE);  Surgeon: Dolores Patty, MD;  Location: [facility] ENDOSCOPY;  Service: Cardiovascular;  Laterality: N/A;     Medications Prior to Admission: Prior to Admission medications   Medication Sig Start Date End Date Taking? Authorizing Provider  albuterol (VENTOLIN HFA) 108 (90 Base) MCG/ACT inhaler Inhale 2 puffs into the lungs every 6 (six) hours as needed for wheezing or shortness of breath. 07/02/21  Yes [provider]  amiodarone (PACERONE) 200 MG tablet Take 0.5 tablets (100 mg total) by mouth daily. 11/07/22  Yes Clegg, [name] D, NP  amLODipine (NORVASC) 10 MG tablet Take 1 tablet (10 mg total) by mouth daily. 01/02/23  Yes Bensimhon, Bevelyn Buckles, MD  apixaban (ELIQUIS) 5 MG TABS tablet Take 1 tablet (5 mg total) by mouth 2 (two) times daily. 10/07/22  Yes Milford, Anderson Malta, FNP  Budeson-Glycopyrrol-Formoterol (BREZTRI AEROSPHERE) 160-9-4.8 MCG/ACT AERO Inhale 2 puffs into the lungs in the morning and at bedtime. 01/26/23  Yes Omar Person, MD  Cholecalciferol (VITAMIN D-3) 125 MCG (5000 UT) TABS Take 5,000 Units by mouth daily.   Yes [provider]  cyanocobalamin (VITAMIN B12) 1000 MCG tablet Take 1,000 mcg by mouth  daily.   Yes [provider]  dapagliflozin propanediol (FARXIGA) 10 MG TABS tablet Take 1 tablet (10 mg total) by mouth daily before breakfast. 02/02/23  Yes Milford, Anderson Malta, FNP  furosemide (LASIX) 40 MG tablet Take 0.5 tablets (20 mg total) by mouth daily. Patient taking differently: Take 20 mg by mouth 2 (two) times daily. 20 mg every morning and 20 mg every evening 01/24/23  Yes Zannie Cove, MD  hydrALAZINE (APRESOLINE) 50 MG tablet Take 0.5 tablets (25 mg total) by mouth 3 (three) times daily. 01/24/23  Yes Zannie Cove, MD  insulin NPH Human (NOVOLIN N) 100 UNIT/ML injection Inject 35-40 Units into the skin See admin instructions. Inject 40 units into the skin in the morning and 35 units at night 09/08/19  Yes [provider]  insulin regular (NOVOLIN R) 100 units/mL injection Inject 6-15 Units into the skin 3 (three) times daily before meals. Per sliding scale 09/08/19  Yes [provider]  isosorbide mononitrate (IMDUR) 30 MG 24 hr tablet Take 1 tablet (30 mg total) by mouth daily. 12/04/22  Yes Bensimhon, Bevelyn Buckles, MD  LINZESS 72 MCG capsule Take 72 mcg by mouth every morning. 02/09/23  Yes [provider]  pantoprazole (PROTONIX) 40 MG tablet Take 40 mg by mouth daily.   Yes [provider]  potassium chloride (KLOR-CON M) 10 MEQ tablet Take 1 tablet (10 mEq total) by mouth 2 (two) times daily. Patient taking differently: Take 10 mEq by mouth daily. 02/09/23  Yes Milford, Anderson Malta, FNP  potassium chloride (KLOR-CON) 10 MEQ tablet Take 10 mEq by mouth daily. 02/09/23  Yes [provider]  rosuvastatin (CRESTOR) 20 MG tablet Take 1 tablet by mouth once daily Patient taking differently: Take 20 mg by mouth at bedtime. 08/18/22  Yes Nahser, Deloris Ping, MD  nitroGLYCERIN (NITROSTAT) 0.4 MG SL tablet Place 1 tablet (0.4 mg total) under the tongue every 5 (five) minutes as needed for chest pain. 08/31/20   Nahser, Deloris Ping, MD     Allergies:     Allergies  Allergen Reactions   Aldactone [Spironolactone] Other (See Comments)    Swollen breast   Lipitor [Atorvastatin Calcium] Other (See Comments)    Muscle ache   Sulfa Drugs Cross Reactors Other (See Comments)    "raw spots"   Sulfamethoxazole-Trimethoprim     Other reaction(s): Rash   Codeine Rash    Social History:   Social History   Socioeconomic History   Marital status: Married    Spouse name: Not on file   Number of children: 2   Years of education: Not on file   Highest education level: Not on file  Occupational History   Not on file  Tobacco Use   Smoking status: Former    Packs/day: 1.00    Years: 20.00    Additional pack years: 0.00    Total pack years: 20.00    Types: Cigarettes    Quit date: 10/27/2001    Years since quitting: 21.3    Passive exposure: Never   Smokeless tobacco: Never  Vaping Use   Vaping Use: Never used  Substance and Sexual Activity   Alcohol use: No    Alcohol/week: 0.0 standard drinks of alcohol   Drug use: No   Sexual activity: Not Currently  Other Topics Concern   Not on file  Social History Narrative   Not on file   Social Determinants of Health   Financial Resource Strain: Not on file  Food Insecurity: No Food Insecurity (01/21/2023)   Hunger Vital Sign    Worried About Running Out of Food in the Last Year: Never true    Ran Out of Food in the Last Year: Never true  Transportation Needs: No Transportation Needs (01/21/2023)   PRAPARE - Administrator, Civil Service (Medical): No    Lack of Transportation (Non-Medical): No  Physical Activity: Not on file  Stress: Not on file  Social Connections: Not on file  Intimate Partner Violence: Not At Risk (01/21/2023)   Humiliation, Afraid, Rape, and Kick questionnaire    Fear of Current or Ex-Partner: No    Emotionally Abused: No    Physically Abused: No    Sexually Abused: No    Family History:  The patient's family history includes Diabetes in his  brother, daughter, mother, sister, and sister; Heart attack in his father and mother; Heart disease in his daughter, father, and mother; Hypertension in his daughter, father, and mother. There is no history of Breast cancer, Prostate cancer, Colon cancer, or Pancreatic cancer.    ROS:  Please see the history of present illness.   Physical Exam/Data:   Vitals:   02/10/23 1530 02/10/23 1545 02/10/23 1600 02/10/23 1602  BP: 139/65 137/60 (!) 140/98   Pulse: 79 79    Resp: (!) 22 (!) 25 (!) 28   Temp:    98.2 F (36.8 C)  TempSrc:    Oral  SpO2: 94% 94%    Weight:      Height:       No intake or output data in the 24 hours ending 02/10/23 1852 Filed Weights   02/10/23 1202  Weight: 88.9 kg   Body mass index is 28.12 kg/m.   Gen: Mild distress  Neck: JVD into his neck at 90 degrees Ears: Bilateral  Homero Fellers Sign Cardiac: No Rubs or Gallops, holosystolic and diastolic murmurs, RRR +2 radial pulses Respiratory: Bilateral crackles, normal effort, increased  respiratory rate GI: Soft, nontenderly distended  MS: +2 bilateral  edema;  moves all extremities Integument: Skin feels slightly cool Neuro:  At time of evaluation, alert and oriented to person/place/time/situation  Psych: Normal affect    EKG:  The ECG that was done  was personally reviewed and demonstrates SR 1st HB Rate 82 with RBBB and PR 263 (Bifasicular block)  Relevant CV Studies:  Cardiac Studies & Procedures   CARDIAC CATHETERIZATION  CARDIAC CATHETERIZATION 09/23/2022  Narrative Findings:  RA = 8 RV = 48/10 PA = 45/20 (34) PCW = 13 Fick cardiac output/index = 6.0/3.0 Thermo CO/CI = 4.8/2.4 PVR = 3.3 WU (Fick) Ao sat = 90% PA sat = 55%, 58%  Assessment: 1. Mild PAH with normal left-sided filling pressures and normal output  Plan/Discussion:  Medical therapy. Hold diuretics. Likely needs home O2.  Arvilla Meres, MD 5:01 PM     ECHOCARDIOGRAM  ECHOCARDIOGRAM COMPLETE  01/02/2023  Narrative ECHOCARDIOGRAM REPORT    Patient Name:   SAYID MOLL Date of Exam: 01/02/2023 Medical Rec #:  [id number]     Height:       71.0 in Accession #:    [id number]    Weight:       181.7 lb Date of Birth:  28-Feb-1946     BSA:          2.024 m Patient Age:    76 years      BP:           166/70 mmHg Patient Gender: M             HR:           64 bpm. Exam Location:  [facility]  Procedure: 2D Echo, Color Doppler, Cardiac Doppler and Intracardiac Opacification Agent  Indications:    Chronic Systolic Heart Failure I50.22  History:        Patient has prior history of Echocardiogram examinations. Ischemic Heart Disease, Prior CABG; Risk Factors:Diabetes and Hypertension.  Sonographer:    L. Thornton-Maynard Referring Phys: [id number] DANIEL R BENSIMHON  IMPRESSIONS   1. Left ventricular ejection fraction, by estimation, is 35 to 40%. The left ventricle has moderately decreased function. The left ventricle has no regional wall motion abnormalities. Left ventricular diastolic parameters are indeterminate. There is hypokinesis of  the left ventricular, entire inferoseptal wall, inferior wall and inferolateral wall. 2. Right ventricular systolic function is normal. The right ventricular size is normal. There is moderately elevated pulmonary artery systolic pressure. The estimated right ventricular systolic pressure is 45.5 mmHg. 3. Left atrial size was mildly dilated. 4. The mitral valve is normal in structure. Mild mitral valve regurgitation. No evidence of mitral stenosis. 5. Tricuspid valve regurgitation is mild to moderate. 6. The aortic valve is tricuspid. There is mild calcification of the aortic valve. Aortic valve regurgitation is moderate. Mild aortic valve stenosis. Aortic regurgitation PHT measures 467 msec. Aortic valve area, by VTI measures 1.90 cm. Aortic valve mean gradient measures 9.0 mmHg. Aortic valve Vmax measures 2.12 m/s. 7. The inferior vena cava is normal in size  with greater than 50% respiratory variability, suggesting right atrial pressure of 3 mmHg.  FINDINGS Left Ventricle: Left ventricular ejection fraction, by estimation, is 35 to 40%. The left ventricle has moderately decreased function. The left ventricle has no regional wall motion abnormalities. The left ventricular internal cavity size was normal in size. There is no left ventricular hypertrophy. Left ventricular diastolic parameters are indeterminate. The ratio of pulmonic flow to systemic flow (Qp/Qs ratio) is 0.87.  Right Ventricle: The right ventricular size is normal. No increase in right ventricular wall thickness. Right ventricular systolic function is normal. There is moderately elevated pulmonary artery systolic pressure. The tricuspid regurgitant velocity is 3.26 m/s, and with an assumed right atrial pressure of 3 mmHg, the estimated right ventricular systolic pressure is 45.5 mmHg.  Left Atrium: Left atrial size was mildly dilated.  Right Atrium: Right atrial size was normal in size.  Pericardium: There is no evidence of pericardial effusion.  Mitral Valve: The mitral valve is normal in structure. Mild mitral valve regurgitation. No evidence of mitral valve stenosis. MV peak gradient, 12.4 mmHg. The mean mitral valve gradient is 3.0 mmHg.  Tricuspid Valve: The tricuspid valve is normal in structure. Tricuspid valve regurgitation is mild to moderate. No evidence of tricuspid stenosis.  Aortic Valve: The aortic valve is tricuspid. There is mild calcification of the aortic valve. Aortic valve regurgitation is moderate. Aortic regurgitation PHT measures 467 msec. Mild aortic stenosis is present. Aortic valve mean gradient measures 9.0 mmHg. Aortic valve peak gradient measures 18.0 mmHg. Aortic valve area, by VTI measures 1.90 cm.  Pulmonic Valve: The pulmonic valve was normal in structure. Pulmonic valve regurgitation is mild. No evidence of pulmonic stenosis.  Aorta: The aortic  root is normal in size and structure.  Venous: The inferior vena cava is normal in size with greater than 50% respiratory variability, suggesting right atrial pressure of 3 mmHg.  IAS/Shunts: No atrial level shunt detected by color flow Doppler. The ratio of pulmonic flow to systemic flow (Qp/Qs ratio) is 0.87.   LEFT VENTRICLE PLAX 2D LVOT diam:     2.10 cm      Diastology LV SV:         82           LV e' medial:    6.74 cm/s LV SV Index:   41           LV E/e' medial:  22.8 LVOT Area:     3.46 cm     LV e' lateral:   6.42 cm/s LV E/e' lateral: 23.9  LV Volumes (MOD) LV vol d, MOD A2C: 215.0 ml LV vol d, MOD A4C: 172.0 ml LV vol s, MOD A2C: 126.0 ml LV  vol s, MOD A4C: 103.0 ml LV SV MOD A2C:     89.0 ml LV SV MOD A4C:     172.0 ml LV SV MOD BP:      81.7 ml  RIGHT VENTRICLE          IVC RVOT diam:      2.10 cm  IVC diam: 1.80 cm TAPSE (M-mode): 1.4 cm  LEFT ATRIUM             Index        RIGHT ATRIUM           Index LA Vol (A2C):   75.1 ml 37.10 ml/m  RA Area:     19.80 cm LA Vol (A4C):   64.7 ml 31.96 ml/m  RA Volume:   58.10 ml  28.70 ml/m LA Biplane Vol: 71.1 ml 35.12 ml/m AORTIC VALVE                     PULMONIC VALVE AV Area (Vmax):    1.96 cm      PV Area (Vmax):   2.62 cm AV Area (Vmean):   1.80 cm      PV Area (Vmean):  2.39 cm AV Area (VTI):     1.90 cm      PV Area (VTI):    2.49 cm AV Vmax:           212.00 cm/s   PV Vmax:          1.72 m/s AV Vmean:          141.000 cm/s  PV Vmean:         110.500 cm/s AV VTI:            0.435 m       PV VTI:           0.287 m AV Peak Grad:      18.0 mmHg     PV Peak grad:     11.8 mmHg AV Mean Grad:      9.0 mmHg      PV Mean grad:     5.5 mmHg LVOT Vmax:         120.00 cm/s   PR End Diast Vel: 9.24 msec LVOT Vmean:        73.300 cm/s   RVOT Peak grad:   7 mmHg LVOT VTI:          0.238 m LVOT/AV VTI ratio: 0.55 AI PHT:            467 msec  AORTA Ao Root diam: 3.10 cm Ao Asc diam:  3.40 cm Ao Arch diam:  2.9 cm  MITRAL VALVE                TRICUSPID VALVE MV Area (PHT): 6.14 cm     TR Peak grad:   42.5 mmHg MV Area VTI:   1.77 cm     TR Vmax:        326.00 cm/s MV Peak grad:  12.4 mmHg MV Mean grad:  3.0 mmHg     SHUNTS MV Vmax:       1.76 m/s     Systemic VTI:  0.24 m MV Vmean:      83.4 cm/s    Systemic Diam: 2.10 cm MV Decel Time: 124 msec     Pulmonic VTI:  0.206 m MV E velocity: 153.50 cm/s  Pulmonic Diam: 2.10 cm Qp/Qs:  0.87  Arvilla Meres MD Electronically signed by Arvilla Meres MD Signature Date/Time: 01/02/2023/2:42:00 PM    Final   TEE  ECHO TEE 10/22/2022  Narrative TRANSESOPHOGEAL ECHO REPORT    Patient Name:   LENZIE BARBIERI Date of Exam: 10/22/2022 Medical Rec #:  [id number]     Height:       71.0 in Accession #:    [id number]    Weight:       168.0 lb Date of Birth:  16-Feb-1946     BSA:          1.958 m Patient Age:    76 years      BP:           101/49 mmHg Patient Gender: M             HR:           79 bpm. Exam Location:  Inpatient  Procedure: Transesophageal Echo, Cardiac Doppler and Color Doppler  Indications:     Atrial flutter, unspecified type (HCC) [X64.68 (ICD-10-CM)]  History:         Patient has prior history of Echocardiogram examinations, most recent 09/21/2022. CAD, PAD; Risk Factors:Hypertension, Diabetes and Dyslipidemia.  Sonographer:     Leta Jungling RDCS Referring Phys:  [id number] DANIEL R BENSIMHON Diagnosing Phys: Arvilla Meres MD  PROCEDURE: After discussion of the risks and benefits of a TEE, an informed consent was obtained from the patient. TEE procedure time was 7 minutes. The transesophogeal probe was passed without difficulty through the esophogus of the patient. Imaged were obtained with the patient in a left lateral decubitus position. Sedation performed by different physician. The patient was monitored while under deep sedation. Anesthestetic sedation was provided intravenously by Anesthesiology: 163.26mg   of Propofol, 100mg  of Lidocaine. Image quality was good. The patient's vital signs; including heart rate, blood pressure, and oxygen saturation; remained stable throughout the procedure. The patient developed no complications during the procedure. A successful direct current cardioversion was performed at 150 joules with 1 attempt.  IMPRESSIONS   1. Left ventricular ejection fraction, by estimation, is 30 to 35%. The left ventricle has moderately decreased function. The left ventricular internal cavity size was mildly dilated. 2. Right ventricular systolic function is normal. The right ventricular size is normal. 3. Left atrial size was moderately dilated. No left atrial/left atrial appendage thrombus was detected. 4. Right atrial size was moderately dilated. 5. The mitral valve is grossly normal. Mild mitral valve regurgitation. No evidence of mitral stenosis. 6. The aortic valve is tricuspid. There is mild calcification of the aortic valve. Aortic valve regurgitation is mild. No aortic stenosis is present. 7. There is mild (Grade II) plaque involving the descending aorta.  FINDINGS Left Ventricle: Left ventricular ejection fraction, by estimation, is 30 to 35%. The left ventricle has moderately decreased function. The left ventricular internal cavity size was mildly dilated.  Right Ventricle: The right ventricular size is normal. No increase in right ventricular wall thickness. Right ventricular systolic function is normal.  Left Atrium: Left atrial size was moderately dilated. No left atrial/left atrial appendage thrombus was detected.  Right Atrium: Right atrial size was moderately dilated.  Pericardium: There is no evidence of pericardial effusion.  Mitral Valve: The mitral valve is grossly normal. Mild mitral valve regurgitation. No evidence of mitral valve stenosis.  Tricuspid Valve: The tricuspid valve is grossly normal. Tricuspid valve regurgitation is trivial. No evidence of  tricuspid stenosis.  Aortic Valve: The aortic valve is tricuspid.  There is mild calcification of the aortic valve. Aortic valve regurgitation is mild. No aortic stenosis is present.  Pulmonic Valve: The pulmonic valve was grossly normal. Pulmonic valve regurgitation is not visualized. No evidence of pulmonic stenosis.  Aorta: The aortic root is normal in size and structure. There is mild (Grade II) plaque involving the descending aorta.  IAS/Shunts: No atrial level shunt detected by color flow Doppler.  Additional Comments: Spectral Doppler performed.  Arvilla Meres MD Electronically signed by Arvilla Meres MD Signature Date/Time: 10/22/2022/11:32:12 AM    Final                Laboratory Data:  Chemistry Recent Labs  Lab 02/10/23 1302  NA 135  K 4.0  CL 102  CO2 24  GLUCOSE 169*  BUN 49*  CREATININE 2.32*  CALCIUM 8.8*  GFRNONAA 29*  ANIONGAP 9    No results for input(s): "PROT", "ALBUMIN", "AST", "ALT", "ALKPHOS", "BILITOT" in the last 168 hours. Hematology Recent Labs  Lab 02/10/23 1302  WBC 5.9  RBC 4.28  HGB 11.8*  HCT 38.0*  MCV 88.8  MCH 27.6  MCHC 31.1  RDW 20.8*  PLT 184   Cardiac EnzymesNo results for input(s): "TROPONINI" in the last 168 hours. No results for input(s): "TROPIPOC" in the last 168 hours.  BNP Recent Labs  Lab 02/10/23 1302  BNP 2,508.7*    DDimer No results for input(s): "DDIMER" in the last 168 hours.  Radiology/Studies:  DG Chest 2 View  Result Date: 02/10/2023 CLINICAL DATA:  Shortness of breath, swelling, lower extremity edema EXAM: CHEST - 2 VIEW COMPARISON:  01/20/2023 FINDINGS: Status post median sternotomy. Unchanged cardiac and mediastinal contours. Small bilateral pleural effusions, which appear increased from the prior exam. Mild diffuse interstitial opacities, similar to prior. No acute osseous abnormality. No evidence of pneumothorax. IMPRESSION: 1. Small bilateral pleural effusions, which appear  increased from the prior exam. 2. Mild diffuse interstitial opacities, similar to prior, likely pulmonary edema. Electronically Signed   By: Wiliam Ke M.D.   On: 02/10/2023 12:48    Assessment and Plan:    Acute on chronic combined systolic and diastolic CHF - with mild MR, Moderate AI, Mild TR TR - NYHA IV, hypervolemic; etiology for HF is mixed; decompensation likely related to decrease lasix dose for balance with CKD IIIa - continue Farxiga 10 mg daily. - Continue hydralazine 25 mg tid + Imdur 30 mg daily. - Re-dosing lasix 80 IV again tonight -  Acei/ARNi with hyperkalemia and CKD - had painful gynecomastia with MRA, will not plan to add eplerenone or finereone due to above  - Off BB for now with recent acute exacerbation; peri-discharge bisoprolol start - Iron studies for AM due to mild anemia, CMP  ordered   Persistent AFL - 10/22/22 TEE/DC-CV ---> SR - Remains in NSR - Continue amiodarone 100 mg daily. 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   Riley Lam, MD FASE [facility] Cardiologist [facility]  [address] [place], [address] [place], [place] [address] [phone]  6:52 PM

## 2023-02-11 DIAGNOSIS — I5023 Acute on chronic systolic (congestive) heart failure: Secondary | ICD-10-CM

## 2023-02-11 DIAGNOSIS — Y92239 Unspecified place in hospital as the place of occurrence of the external cause: Secondary | ICD-10-CM | POA: Diagnosis not present

## 2023-02-11 DIAGNOSIS — Z7984 Long term (current) use of oral hypoglycemic drugs: Secondary | ICD-10-CM | POA: Diagnosis not present

## 2023-02-11 DIAGNOSIS — I951 Orthostatic hypotension: Secondary | ICD-10-CM | POA: Diagnosis not present

## 2023-02-11 DIAGNOSIS — I5043 Acute on chronic combined systolic (congestive) and diastolic (congestive) heart failure: Secondary | ICD-10-CM | POA: Diagnosis not present

## 2023-02-11 DIAGNOSIS — I959 Hypotension, unspecified: Secondary | ICD-10-CM | POA: Diagnosis not present

## 2023-02-11 DIAGNOSIS — R0602 Shortness of breath: Secondary | ICD-10-CM | POA: Diagnosis not present

## 2023-02-11 DIAGNOSIS — J9621 Acute and chronic respiratory failure with hypoxia: Secondary | ICD-10-CM | POA: Diagnosis not present

## 2023-02-11 DIAGNOSIS — I4729 Other ventricular tachycardia: Secondary | ICD-10-CM | POA: Diagnosis not present

## 2023-02-11 DIAGNOSIS — I088 Other rheumatic multiple valve diseases: Secondary | ICD-10-CM | POA: Diagnosis not present

## 2023-02-11 DIAGNOSIS — N189 Chronic kidney disease, unspecified: Secondary | ICD-10-CM | POA: Diagnosis not present

## 2023-02-11 DIAGNOSIS — Z452 Encounter for adjustment and management of vascular access device: Secondary | ICD-10-CM | POA: Diagnosis not present

## 2023-02-11 DIAGNOSIS — Z794 Long term (current) use of insulin: Secondary | ICD-10-CM | POA: Diagnosis not present

## 2023-02-11 DIAGNOSIS — J439 Emphysema, unspecified: Secondary | ICD-10-CM | POA: Diagnosis not present

## 2023-02-11 DIAGNOSIS — E1165 Type 2 diabetes mellitus with hyperglycemia: Secondary | ICD-10-CM | POA: Diagnosis not present

## 2023-02-11 DIAGNOSIS — Z9981 Dependence on supplemental oxygen: Secondary | ICD-10-CM | POA: Diagnosis not present

## 2023-02-11 DIAGNOSIS — N179 Acute kidney failure, unspecified: Secondary | ICD-10-CM | POA: Diagnosis not present

## 2023-02-11 DIAGNOSIS — E119 Type 2 diabetes mellitus without complications: Secondary | ICD-10-CM | POA: Diagnosis not present

## 2023-02-11 DIAGNOSIS — Z7189 Other specified counseling: Secondary | ICD-10-CM | POA: Diagnosis not present

## 2023-02-11 DIAGNOSIS — J90 Pleural effusion, not elsewhere classified: Secondary | ICD-10-CM | POA: Diagnosis not present

## 2023-02-11 DIAGNOSIS — I130 Hypertensive heart and chronic kidney disease with heart failure and stage 1 through stage 4 chronic kidney disease, or unspecified chronic kidney disease: Secondary | ICD-10-CM | POA: Diagnosis not present

## 2023-02-11 DIAGNOSIS — I509 Heart failure, unspecified: Secondary | ICD-10-CM | POA: Insufficient documentation

## 2023-02-11 DIAGNOSIS — I272 Pulmonary hypertension, unspecified: Secondary | ICD-10-CM | POA: Diagnosis not present

## 2023-02-11 DIAGNOSIS — Z951 Presence of aortocoronary bypass graft: Secondary | ICD-10-CM | POA: Diagnosis not present

## 2023-02-11 DIAGNOSIS — E871 Hypo-osmolality and hyponatremia: Secondary | ICD-10-CM | POA: Diagnosis not present

## 2023-02-11 DIAGNOSIS — I472 Ventricular tachycardia, unspecified: Secondary | ICD-10-CM | POA: Diagnosis not present

## 2023-02-11 DIAGNOSIS — I255 Ischemic cardiomyopathy: Secondary | ICD-10-CM | POA: Diagnosis not present

## 2023-02-11 DIAGNOSIS — N1831 Chronic kidney disease, stage 3a: Secondary | ICD-10-CM | POA: Diagnosis not present

## 2023-02-11 DIAGNOSIS — Z515 Encounter for palliative care: Secondary | ICD-10-CM | POA: Diagnosis not present

## 2023-02-11 DIAGNOSIS — J9811 Atelectasis: Secondary | ICD-10-CM | POA: Diagnosis not present

## 2023-02-11 DIAGNOSIS — Z95820 Peripheral vascular angioplasty status with implants and grafts: Secondary | ICD-10-CM | POA: Diagnosis not present

## 2023-02-11 DIAGNOSIS — K219 Gastro-esophageal reflux disease without esophagitis: Secondary | ICD-10-CM | POA: Diagnosis not present

## 2023-02-11 DIAGNOSIS — I082 Rheumatic disorders of both aortic and tricuspid valves: Secondary | ICD-10-CM | POA: Diagnosis not present

## 2023-02-11 DIAGNOSIS — E1151 Type 2 diabetes mellitus with diabetic peripheral angiopathy without gangrene: Secondary | ICD-10-CM | POA: Diagnosis not present

## 2023-02-11 DIAGNOSIS — I350 Nonrheumatic aortic (valve) stenosis: Secondary | ICD-10-CM | POA: Diagnosis not present

## 2023-02-11 DIAGNOSIS — J9601 Acute respiratory failure with hypoxia: Secondary | ICD-10-CM | POA: Diagnosis not present

## 2023-02-11 DIAGNOSIS — I2510 Atherosclerotic heart disease of native coronary artery without angina pectoris: Secondary | ICD-10-CM | POA: Diagnosis not present

## 2023-02-11 DIAGNOSIS — E1122 Type 2 diabetes mellitus with diabetic chronic kidney disease: Secondary | ICD-10-CM | POA: Diagnosis not present

## 2023-02-11 DIAGNOSIS — I4891 Unspecified atrial fibrillation: Secondary | ICD-10-CM | POA: Diagnosis not present

## 2023-02-11 DIAGNOSIS — I340 Nonrheumatic mitral (valve) insufficiency: Secondary | ICD-10-CM | POA: Diagnosis not present

## 2023-02-11 DIAGNOSIS — D6832 Hemorrhagic disorder due to extrinsic circulating anticoagulants: Secondary | ICD-10-CM | POA: Diagnosis not present

## 2023-02-11 DIAGNOSIS — Z66 Do not resuscitate: Secondary | ICD-10-CM | POA: Diagnosis not present

## 2023-02-11 DIAGNOSIS — E1169 Type 2 diabetes mellitus with other specified complication: Secondary | ICD-10-CM | POA: Diagnosis not present

## 2023-02-11 DIAGNOSIS — Z87891 Personal history of nicotine dependence: Secondary | ICD-10-CM | POA: Diagnosis not present

## 2023-02-11 DIAGNOSIS — E1142 Type 2 diabetes mellitus with diabetic polyneuropathy: Secondary | ICD-10-CM | POA: Diagnosis not present

## 2023-02-11 DIAGNOSIS — R040 Epistaxis: Secondary | ICD-10-CM | POA: Diagnosis not present

## 2023-02-11 DIAGNOSIS — I4892 Unspecified atrial flutter: Secondary | ICD-10-CM | POA: Diagnosis not present

## 2023-02-11 DIAGNOSIS — I110 Hypertensive heart disease with heart failure: Secondary | ICD-10-CM | POA: Diagnosis not present

## 2023-02-11 DIAGNOSIS — E785 Hyperlipidemia, unspecified: Secondary | ICD-10-CM | POA: Diagnosis not present

## 2023-02-11 LAB — CBC
HCT: 37.4 % — ABNORMAL LOW (ref 39.0–52.0)
Hemoglobin: 11.7 g/dL — ABNORMAL LOW (ref 13.0–17.0)
MCH: 27.5 pg (ref 26.0–34.0)
MCHC: 31.3 g/dL (ref 30.0–36.0)
MCV: 88 fL (ref 80.0–100.0)
Platelets: 173 10*3/uL (ref 150–400)
RBC: 4.25 MIL/uL (ref 4.22–5.81)
RDW: 20.6 % — ABNORMAL HIGH (ref 11.5–15.5)
WBC: 5.2 10*3/uL (ref 4.0–10.5)
nRBC: 0 % (ref 0.0–0.2)

## 2023-02-11 LAB — GLUCOSE, CAPILLARY
Glucose-Capillary: 91 mg/dL (ref 70–99)
Glucose-Capillary: 74 mg/dL (ref 70–99)
Glucose-Capillary: 236 mg/dL — ABNORMAL HIGH (ref 70–99)
Glucose-Capillary: 200 mg/dL — ABNORMAL HIGH (ref 70–99)
Glucose-Capillary: 172 mg/dL — ABNORMAL HIGH (ref 70–99)
Glucose-Capillary: 193 mg/dL — ABNORMAL HIGH (ref 70–99)

## 2023-02-11 LAB — HEPARIN LEVEL (UNFRACTIONATED): Heparin Unfractionated: >1.1 IU/mL — ABNORMAL HIGH (ref 0.30–0.70)

## 2023-02-11 LAB — COMPREHENSIVE METABOLIC PANEL
ALT: 25 U/L (ref 0–44)
AST: 29 U/L (ref 15–41)
Albumin: 3 g/dL — ABNORMAL LOW (ref 3.5–5.0)
Alkaline Phosphatase: 85 U/L (ref 38–126)
Anion gap: 7 (ref 5–15)
BUN: 44 mg/dL — ABNORMAL HIGH (ref 8–23)
CO2: 22 mmol/L (ref 22–32)
Calcium: 8.5 mg/dL — ABNORMAL LOW (ref 8.9–10.3)
Chloride: 102 mmol/L (ref 98–111)
Creatinine, Ser: 2.32 mg/dL — ABNORMAL HIGH (ref 0.61–1.24)
GFR, Estimated: 29 mL/min — ABNORMAL LOW (ref >60)
Glucose, Bld: 116 mg/dL — ABNORMAL HIGH (ref 70–99)
Potassium: 3.6 mmol/L (ref 3.5–5.1)
Sodium: 131 mmol/L — ABNORMAL LOW (ref 135–145)
Total Bilirubin: 0.5 mg/dL (ref 0.3–1.2)
Total Protein: 6.3 g/dL — ABNORMAL LOW (ref 6.5–8.1)

## 2023-02-11 LAB — MAGNESIUM: Magnesium: 3.1 mg/dL — ABNORMAL HIGH (ref 1.7–2.4)

## 2023-02-11 LAB — APTT
aPTT: 82 seconds — ABNORMAL HIGH (ref 24–36)
aPTT: 109 seconds — ABNORMAL HIGH (ref 24–36)
aPTT: 94 seconds — ABNORMAL HIGH (ref 24–36)

## 2023-02-11 MED ORDER — POTASSIUM CHLORIDE CRYS ER 20 MEQ PO TBCR
40.0000 meq | EXTENDED_RELEASE_TABLET | Freq: Two times a day (BID) | ORAL | Status: DC
  Administered 2023-02-11: 40 meq via ORAL
  Filled 2023-02-11: qty 2

## 2023-02-11 MED ORDER — HYDRALAZINE HCL 25 MG PO TABS
37.5000 mg | ORAL_TABLET | Freq: Three times a day (TID) | ORAL | Status: DC
  Administered 2023-02-12 – 2023-02-14 (×7): 37.5 mg via ORAL
  Filled 2023-02-11 (×7): qty 2

## 2023-02-11 MED ORDER — POTASSIUM CHLORIDE CRYS ER 20 MEQ PO TBCR
30.0000 meq | EXTENDED_RELEASE_TABLET | Freq: Once | ORAL | Status: AC
  Administered 2023-02-11: 30 meq via ORAL
  Filled 2023-02-11: qty 1

## 2023-02-11 MED ORDER — METOLAZONE 5 MG PO TABS
5.0000 mg | ORAL_TABLET | Freq: Once | ORAL | Status: AC
  Administered 2023-02-11: 5 mg via ORAL
  Filled 2023-02-11: qty 1

## 2023-02-11 MED ORDER — ALPRAZOLAM 0.5 MG PO TABS
0.5000 mg | ORAL_TABLET | Freq: Three times a day (TID) | ORAL | Status: DC | PRN
  Administered 2023-02-11 – 2023-02-12 (×3): 0.5 mg via ORAL
  Filled 2023-02-11 (×3): qty 1

## 2023-02-11 MED ORDER — FUROSEMIDE 10 MG/ML IJ SOLN
80.0000 mg | Freq: Two times a day (BID) | INTRAMUSCULAR | Status: DC
  Administered 2023-02-11: 80 mg via INTRAVENOUS
  Filled 2023-02-11: qty 8

## 2023-02-11 NOTE — Progress Notes (Signed)
ANTICOAGULATION CONSULT NOTE  Pharmacy Consult for Eliquis >> IV heparin Indication: atrial fibrillation  Allergies  Allergen Reactions   Aldactone [Spironolactone] Other (See Comments)    Swollen breast   Lipitor [Atorvastatin Calcium] Other (See Comments)    Muscle ache   Codeine Rash   Sulfa Drugs Cross Reactors Other (See Comments)    "raw spots"   Sulfamethoxazole-Trimethoprim Rash    Other reaction(s): Rash    Patient Measurements: Height: 5\' 10"  (177.8 cm) Weight: 89 kg (196 lb 1.6 oz) IBW/kg (Calculated) : 73 Heparin Dosing Weight: 88.9kg  Vital Signs: Temp: 97.8 F (36.6 C) (04/17 1620) Temp Source: Oral (04/17 1620) BP: 139/84 (04/17 1620) Pulse Rate: 71 (04/17 1620)  Labs: Recent Labs    02/10/23 1302 02/10/23 2007 02/10/23 2007 02/11/23 0106 02/11/23 0607 02/11/23 1737  HGB 11.8*  --   --  11.7*  --   --   HCT 38.0*  --   --  37.4*  --   --   PLT 184  --   --  173  --   --   APTT  --  45*   < > 82* 109* 94*  HEPARINUNFRC  --  >1.10*  --  >1.10*  --   --   CREATININE 2.32* 2.31*  --  2.32*  --   --    < > = values in this interval not displayed.     Estimated Creatinine Clearance: 30.9 mL/min (A) (by C-G formula based on SCr of 2.32 mg/dL (H)).   Medical History: Past Medical History:  Diagnosis Date   Acute hypoxemic respiratory failure 01/20/2023   Acute kidney injury superimposed on chronic kidney disease 10/19/2022   Acute on chronic systolic CHF (congestive heart failure) 01/21/2023   Arthritis    CAD (coronary artery disease)    Carotid artery occlusion    Diabetes mellitus    fasting 100-120s   Dizziness    Dysrhythmia    skips beats   GERD (gastroesophageal reflux disease)    History of kidney stones    Hyperlipidemia    Hypertension    IHD (ischemic heart disease)    Prior CABG in 2003   Normal nuclear stress test 03/2011   Peripheral vascular disease    Prostate cancer    Torn rotator cuff     Medications:   Scheduled:   amiodarone  100 mg Oral Daily   dapagliflozin propanediol  10 mg Oral QAC breakfast   fluticasone furoate-vilanterol  1 puff Inhalation Daily   furosemide  80 mg Intravenous BID   hydrALAZINE  37.5 mg Oral TID   insulin aspart  0-5 Units Subcutaneous QHS   insulin aspart  0-9 Units Subcutaneous TID WC   insulin glargine-yfgn  10 Units Subcutaneous Daily   isosorbide mononitrate  30 mg Oral Daily   linaclotide  72 mcg Oral q morning   pantoprazole  40 mg Oral Daily   potassium chloride  40 mEq Oral BID   rosuvastatin  20 mg Oral QHS   umeclidinium bromide  1 puff Inhalation Daily    Assessment: 77 yo male w/ PMH Afib on Eliquis PTA - last dose 4/16 @ 0730 AM. Potential need for RHC so cardiology switching Eliquis to IV heparin.   aPTT therapeutic at 94 seconds after rate decrease this AM. No issues with bleeding noted.  Goal of Therapy:  Heparin level 0.3-0.7 units/ml aPTT 66-102 seconds Monitor platelets by anticoagulation protocol: Yes   Plan:  Continue heparin  drip at 1100 units/hr Daily heparin level, aPTT, and CBC Current plan is to resume Eliquis 4/18  Rexford Maus, PharmD, BCPS 02/11/2023 7:53 PM

## 2023-02-11 NOTE — Assessment & Plan Note (Addendum)
-  Echocardiogram with reduced LV systolic function with EF 35 to 40%, no LVH, RV systolic function preserved, RVSP 45,5 mid dilatation of LA, no pericardial effusion, mild to moderate TR, mild aortic stenosis.  Low output heart failure.   Urine output is 3000 ml  Systolic blood pressure is 117 to 121 mmHg.  SV02 53.9   Continue diuresis with furosemide drip 20 mg/hr.  05/03 metolazone 2,5 mg   Continue after load reduction with hydralazine and isosorbide.  Resumed amiodarone for signs of recurrent low output failure.   Acute on chronic hypoxemic respiratory failure due to cardiogenic pulmonary edema.  02 saturation today 97% on 3 L/min per [place]. \  Patient may need inotropic home infusion at the time of his discharge.

## 2023-02-11 NOTE — Progress Notes (Signed)
Heart Failure Navigator Progress Note  Assessed for Heart & Vascular TOC clinic readiness.  Patient does not meet criteria due to Advanced Heart Failure Team patient.   Navigator will sign off at this time.    Frankey Botting, BSN, RN Heart Failure Nurse Navigator Secure Chat Only   

## 2023-02-11 NOTE — Hospital Course (Addendum)
[name] was admitted to the hospital with the working diagnosis of heart failure exacerbation.   77 yo male with the past medical history of heart failure, T2DM, CKD, COPD chronic hypoxemic respiratory failure and hypertension who presented with lower extremity edema. Recent hospitalization 03/26 to 01/24/23 for heart failure exacerbation. He was discharged with 20 mg furosemide. At home he developed worsening lower extremity edema. Cardiology outpatient follow up he had gain 5 lbs from the time of his discharge. At home he continue to have worsening edema, and weight gain for 9 lbs more. On his initial physical examination his blood pressure was 129/82, HR 86, RR 20 and 02 saturation 92%, lungs with bilateral rales, more left than right, positive JVD, heart with S1 and S2 present and rhythmic, abdomen with no distention, positive lower extremity edema pitting.   Na 135, K 4,0 Cl 102 bicarbonate 24 glucose 169, bun 49 cr 2,32  BNP 2,508  Wbc 5,9 hgb 11,8 plt 184   Chest radiograph with cardiomegaly, bilateral hilar vascular congestion, with bilateral interstitial infiltrates, small bilateral pleural effusions, and fluid in the right fissure.  Sternotomy wires in place.   EKG 82 bpm, normal axis, right bundle branch block, sinus rhythm with 1st degree AV block, with no significant ST segment or  T wave changes.   Patient has been placed on furosemide for diuresis.   04/19 volume status is improving but not yet back to baseline.  Patient placed on dobutamine infusion.  04/20 patient developed VT and was transferred to the intensive care unit 2H per cardiology recommendations.   04/21: CVP 14, still expressing dyspnea on exertion and demonstrating signs of fluid overload.  Rate controlled with amiodarone drip.  Following cardiology/heart failure service recommendation milrinone has been started.  Continue IV diuresis. 04/22 patient on milrinone infusion.  04/23 patient with improvement in volume  status but not back to baseline. Plan for right heart catheterization.  4/24: RHC with elevated filling pressures, wedge of 24, cardiac output 4.7 on milrinone -4/24: Recurrent epistaxis, treated with Afrin and tranexamic acid, Eliquis held -4/26: Persistent epistaxis ENT consulted anterior/posterior nasal packing performed by [name] -4/27, amiodarone changed to p.o. -4/29, TEE/DCCV> NSR  05/03 recurrent volume overload and signs of low output failure. Placed back on milrinone infusion.  05/04 volume status improving with inotropic support.  05/05 symptoms continue to improve with inotropic support.  05/06 continue on milrinone, transitioned back to oral loop diuretic therapy with torsemide.  05/07 not able to wean off milrinone infusion, patient with poor prognosis and now labeled as end stage heart failure.  Plan for home milrinone infusion for palliative treatment. Patient will have home health services and palliative care, transition to hospice if continue to deteriorate on milrinone infusion.  Right IJV tunneled PICC placement today.

## 2023-02-11 NOTE — Progress Notes (Signed)
Progress Note   Patient: [name]:[id number] DOB: 1946/09/27 DOA: 02/10/2023     0 DOS: the patient was seen and examined on 02/11/2023   Brief hospital course: [name] was admitted to the hospital with the working diagnosis of heart failure exacerbation.   77 yo male with the past medical history of heart failure, T2DM, CKD, COPD chronic hypoxemic respiratory failure and hypertension who presented with lower extremity edema. Recent hospitalization 03/26 to 01/24/23 for heart failure exacerbation. He was discharged with 20 mg furosemide. At home he developed worsening lower extremity edema. Cardiology outpatient follow up he had gain 5 lbs from the time of his discharge. At home he continue to have worsening edema, and weight gain for 9 lbs more. On his initial physical examination his blood pressure was 129/82, HR 86, RR 20 and 02 saturation 92%, lungs with bilateral rales, more left than right, positive JVD, heart with S1 and S2 present and rhythmic, abdomen with no distention, positive lower extremity edema pitting.   Patient has been placed on furosemide for diuresis.   Assessment and Plan: * Acute on chronic systolic CHF (congestive heart failure) Echocardiogram with reduced LV systolic function with EF 35 to 40%, no LVH, RV systolic function preserved, RVSP 45,5 mid dilatation of LA, no pericardial effusion, mild to moderate TR, mild aortic stenosis,   Patient continue hypervolemic. Urine output documented 750 ml Systolic blood pressure 126 to 139 mmHg.  Plan to continue diuresis with furosemide 80 mg IV q12 hrs  Dapagliflozin, hydralazine and isosorbide.  Limited pharmacology therapy due to reduced GFR Add Unna boots bilaterally.  Metolazone today 5 mg.   CKD stage 3a, GFR 45-59 ml/min Hyponatremia.   Renal function with serum cr at 2,32 with K at 3,6 and serum bicarbonate at 22. Na 131 and Mg 3,1   Plan to continue diuresis and follow up renal function and  electrolytes in am.   Atrial flutter Continue amiodarone for rate and rhythm control.  Heparin drip for anticoagulation.  Continue telemetry monitoring.    GERD (gastroesophageal reflux disease) Continue protonix 40 mg daily.  Hyperlipidemia associated with type 2 diabetes mellitus Continue crestor 20 mg  Insulin dependent type 2 diabetes mellitus Continue insulin therapy with siding scale and basal 10 units. Fasting glucose this am 116   COPD (chronic obstructive pulmonary disease) Stable. Prn nebs. Not currently exacerbated.  Chronic respiratory failure with hypoxia, on home O2 therapy - on 2 L/min PRN. has concentrator at home. Continue with supplemental O2 as needed.  DNR (do not resuscitate) Verified pt is a DNR. Pt is emphatic that he is a DNR/DNI.        Subjective: Patient with no chest pain, continue to have dyspnea, positive lower extremity edema and positive anxiety, reports pnd and orthopnea   Physical Exam: Vitals:   02/11/23 0510 02/11/23 0804 02/11/23 1242 02/11/23 1620  BP: 128/72 (!) 140/63  139/84  Pulse: 76  62 71  Resp: 20   16  Temp: 97.7 F (36.5 C) 97.8 F (36.6 C) 97.8 F (36.6 C) 97.8 F (36.6 C)  TempSrc: Oral Oral Oral Oral  SpO2: 93%  93% 95%  Weight: 89 kg     Height:       Neurology awake and alert ENT with mild pallor Cardiovascular with S1 and S2 present and regular with no gallops Positive JVD Positive pitting bilateral lower extremity edema +++ Respiratory with rales bilaterally Abdomen with no distention  Data Reviewed:  Family Communication: no family at the bedside   Disposition: Status is: Inpatient Remains inpatient appropriate because: heart failure   Planned Discharge Destination: Home      Author: Coralie Keens, MD 02/11/2023 5:49 PM  For on call review [email or web address].

## 2023-02-11 NOTE — Inpatient Diabetes Management (Signed)
Inpatient Diabetes Program Recommendations  AACE/ADA: New Consensus Statement on Inpatient Glycemic Control (2015)  Target Ranges:  Prepandial:   less than 140 mg/dL      Peak postprandial:   less than 180 mg/dL (1-2 hours)      Critically ill patients:  140 - 180 mg/dL   Lab Results  Component Value Date   GLUCAP 200 (H) 02/11/2023   HGBA1C 7.0 (H) 09/20/2022    Latest Reference Range & Units 02/11/23 01:47 02/11/23 06:06 02/11/23 08:39 02/11/23 12:40  Glucose-Capillary 70 - 99 mg/dL 91 74 161 (H) 096 (H)  (H): Data is abnormally high  Diabetes history: DM2 Outpatient Diabetes medications: Novolin N 40 units am, 35 units hs, Novolin R 6-15 units tid meal coverage Current orders for Inpatient glycemic control: Semglee 10 units, Novolog 0-9 units tid, 0-5 units hs correction, Farxiga 10 mg qd  Inpatient Diabetes Program Recommendations:   Please consider: -Add carb mod to current diet order -Add Novolog 3 units tid meal coverage if eats 50%  Thank you, Darel Hong E. Theresa Dohrman, RN, MSN, CDE  Diabetes Coordinator Inpatient Glycemic Control Team Team Pager [phone] (8am-5pm) 02/11/2023 12:57 PM

## 2023-02-11 NOTE — Progress Notes (Addendum)
ANTICOAGULATION CONSULT NOTE  Pharmacy Consult for Eliquis >> IV heparin Indication: atrial fibrillation  Allergies  Allergen Reactions   Aldactone [Spironolactone] Other (See Comments)    Swollen breast   Lipitor [Atorvastatin Calcium] Other (See Comments)    Muscle ache   Codeine Rash   Sulfa Drugs Cross Reactors Other (See Comments)    "raw spots"   Sulfamethoxazole-Trimethoprim Rash    Other reaction(s): Rash    Patient Measurements: Height:  (177.8 cm) Weight: 89 kg (196 lb 1.6 oz) IBW/kg (Calculated) : 73 Heparin Dosing Weight: 88.9kg  Vital Signs: Temp: 97.7 F (36.5 C) (04/17 0510) Temp Source: Oral (04/17 0510) BP: 128/72 (04/17 0510) Pulse Rate: 76 (04/17 0510)  Labs: Recent Labs    02/10/23 1302 02/10/23 2007 02/11/23 0106 02/11/23 0607  HGB 11.8*  --  11.7*  --   HCT 38.0*  --  37.4*  --   PLT 184  --  173  --   APTT  --  45* 82* 109*  HEPARINUNFRC  --  >1.10* >1.10*  --   CREATININE 2.32* 2.31* 2.32*  --      Estimated Creatinine Clearance: 30.9 mL/min (A) (by C-G formula based on SCr of 2.32 mg/dL (H)).   Medical History: Past Medical History:  Diagnosis Date   Acute hypoxemic respiratory failure 01/20/2023   Acute kidney injury superimposed on chronic kidney disease 10/19/2022   Acute on chronic systolic CHF (congestive heart failure) 01/21/2023   Arthritis    CAD (coronary artery disease)    Carotid artery occlusion    Diabetes mellitus    fasting 100-120s   Dizziness    Dysrhythmia    skips beats   GERD (gastroesophageal reflux disease)    History of kidney stones    Hyperlipidemia    Hypertension    IHD (ischemic heart disease)    Prior CABG in 2003   Normal nuclear stress test 03/2011   Peripheral vascular disease    Prostate cancer    Torn rotator cuff     Medications:  Scheduled:   amiodarone  100 mg Oral Daily   dapagliflozin propanediol  10 mg Oral QAC breakfast   fluticasone furoate-vilanterol  1 puff  Inhalation Daily   hydrALAZINE  25 mg Oral TID   insulin aspart  0-5 Units Subcutaneous QHS   insulin aspart  0-9 Units Subcutaneous TID WC   insulin glargine-yfgn  10 Units Subcutaneous Daily   isosorbide mononitrate  30 mg Oral Daily   linaclotide  72 mcg Oral q morning   pantoprazole  40 mg Oral Daily   rosuvastatin  20 mg Oral QHS   umeclidinium bromide  1 puff Inhalation Daily    Assessment: 77 yo male w/ PMH Afib on Eliquis PTA - last dose 4/16 @ 0730 AM. Potential need for RHC so cardiology switching Eliquis to IV heparin.   Heparin level is undetectably high due to recent DOAC, aPTT is supratherapeutic at 109, on heparin 1250 units/hr. Hgb 11.7, plt 173. No infusion issues. Slight bleeding at IV site - will monitor.   Goal of Therapy:  Heparin level 0.3-0.7 units/ml aPTT 66-102 seconds Monitor platelets by anticoagulation protocol: Yes   Plan:  Reduce heparin drip to 1150 units/hr  aPTT 8 hours  Daily heparin level, aPTT, and CBC F/u plans for RHC and ability to transition back to Eliquis  Thank you for allowing pharmacy to participate in this patient's care,  Sherron Monday, PharmD, BCCCP Clinical Pharmacist  Phone: [phone]  02/11/2023 8:26 AM  Please check AMION for all [facility] Pharmacy phone numbers After 10:00 PM, call Main Pharmacy [phone]

## 2023-02-11 NOTE — Progress Notes (Deleted)
ANTICOAGULATION CONSULT NOTE  Pharmacy Consult for Eliquis >> IV heparin Indication: atrial fibrillation  Allergies  Allergen Reactions   Aldactone [Spironolactone] Other (See Comments)    Swollen breast   Lipitor [Atorvastatin Calcium] Other (See Comments)    Muscle ache   Codeine Rash   Sulfa Drugs Cross Reactors Other (See Comments)    "raw spots"   Sulfamethoxazole-Trimethoprim Rash    Other reaction(s): Rash    Patient Measurements: Height: 5\' 10"  (177.8 cm) Weight: 89 kg (196 lb 1.6 oz) IBW/kg (Calculated) : 73 Heparin Dosing Weight: 88.9kg  Vital Signs: Temp: 97.7 F (36.5 C) (04/17 0510) Temp Source: Oral (04/17 0510) BP: 128/72 (04/17 0510) Pulse Rate: 76 (04/17 0510)  Labs: Recent Labs    02/10/23 1302 02/10/23 2007 02/11/23 0106 02/11/23 0607  HGB 11.8*  --  11.7*  --   HCT 38.0*  --  37.4*  --   PLT 184  --  173  --   APTT  --  45* 82* 109*  HEPARINUNFRC  --  >1.10* >1.10*  --   CREATININE 2.32* 2.31* 2.32*  --      Estimated Creatinine Clearance: 30.9 mL/min (A) (by C-G formula based on SCr of 2.32 mg/dL (H)).   Medical History: Past Medical History:  Diagnosis Date   Acute hypoxemic respiratory failure 01/20/2023   Acute kidney injury superimposed on chronic kidney disease 10/19/2022   Acute on chronic systolic CHF (congestive heart failure) 01/21/2023   Arthritis    CAD (coronary artery disease)    Carotid artery occlusion    Diabetes mellitus    fasting 100-120s   Dizziness    Dysrhythmia    skips beats   GERD (gastroesophageal reflux disease)    History of kidney stones    Hyperlipidemia    Hypertension    IHD (ischemic heart disease)    Prior CABG in 2003   Normal nuclear stress test 03/2011   Peripheral vascular disease    Prostate cancer    Torn rotator cuff     Medications:  Scheduled:   amiodarone  100 mg Oral Daily   dapagliflozin propanediol  10 mg Oral QAC breakfast   fluticasone furoate-vilanterol  1 puff  Inhalation Daily   hydrALAZINE  25 mg Oral TID   insulin aspart  0-5 Units Subcutaneous QHS   insulin aspart  0-9 Units Subcutaneous TID WC   insulin glargine-yfgn  10 Units Subcutaneous Daily   isosorbide mononitrate  30 mg Oral Daily   linaclotide  72 mcg Oral q morning   pantoprazole  40 mg Oral Daily   rosuvastatin  20 mg Oral QHS   umeclidinium bromide  1 puff Inhalation Daily    Assessment: 77 yo male w/ PMH Afib on Eliquis PTA - last dose 4/16 @ 0730 AM. Potential need for ischemic workup so cardiology switching Eliquis to IV heparin.   aPTT is slightly above goal at 109 seconds, heparin level is >1.1 due to DOAC, H/H and pltc stable.  Goal of Therapy:  Heparin level 0.3-0.7 units/ml aPTT 66-102 seconds Monitor platelets by anticoagulation protocol: Yes   Plan:  Reduce heparin to 1100 units/h Recheck aPTT in 8h  Fredonia Highland, PharmD, [place], [facility] Clinical Pharmacist [phone] Please check AMION for all [facility] Pharmacy numbers 02/11/2023

## 2023-02-11 NOTE — Consult Note (Signed)
ADVANCED HEART FAILURE CONSULT NOTE  Referring Physician: No ref. provider found  Primary Care: [name], Adelfa Koh, MD Primary Cardiologist: Dr. Gala Romney  HPI: [name] is a 77 y.o. male with CAD c/p CABG 2003, HFrEF, chronic DOE, Rt CEA >70yrs ago, Lt CEA 14', HTN, DM2, HLD, PAD w/ Rt SFA and Rt politeal artery vascular stents 15', prostate cancer. Admitted 11/23 with a/c systolic heart failure, newly reduced EF. Echo EF 30-35%, mild concentric LVH, GIIDD, RV function mildly reduced, mild MR, mild to mod TR, mild aortic valve regurg. Diuresed with IV lasix. Underwent RHC showing low filling pressures and preserved CO. GDMT started with Farxiga and Lasix. Other GDMT held with AKI. He was discharged home with oxygen, weight 172 lbs. He was seen in the HF clinic 10/07/22. EKG showed new aflutter. Started on eliquis and placed on amiodarone. Plan for TEE/DC-CV 10/22/22.  Admitted once more in December 2023 with hyperkalemia and acute renal failure.  Underwent TEE cardioversion in December 2023 for atrial flutter with conversion to sinus rhythm.  Most recently admitted to [facility] in March 29 for decompensated heart failure requiring IV diuretics.  He was then seen in heart failure clinic on 02/02/2023.  At that time your was reportedly feeling fine with stable O2 requirement at 2 L and was continued on Lasix 20 mg daily along with hydralazine 25 mg 3 times daily and Imdur 30 mg daily.  Today presents to the hospital with a 20 pound weight gain, worsening shortness of breath and abdominal swelling.  In the ER lab work was significant for creatinine of 2.23, BNP of 2500 and high-sensitivity troponin of 714 now 540.  He was given 1 dose of IV Lasix 80 mg.  Past Medical History:  Diagnosis Date   Acute hypoxemic respiratory failure 01/20/2023   Acute kidney injury superimposed on chronic kidney disease 10/19/2022   Acute on chronic systolic CHF (congestive heart failure) 01/21/2023   Arthritis    CAD  (coronary artery disease)    Carotid artery occlusion    Diabetes mellitus    fasting 100-120s   Dizziness    Dysrhythmia    skips beats   GERD (gastroesophageal reflux disease)    History of kidney stones    Hyperlipidemia    Hypertension    IHD (ischemic heart disease)    Prior CABG in 2003   Normal nuclear stress test 03/2011   Peripheral vascular disease    Prostate cancer    Torn rotator cuff     Current Facility-Administered Medications  Medication Dose Route Frequency Provider Last Rate Last Admin   amiodarone (PACERONE) tablet 100 mg  100 mg Oral Daily Carollee Herter, DO   100 mg at 02/11/23 0933   dapagliflozin propanediol (FARXIGA) tablet 10 mg  10 mg Oral QAC breakfast Carollee Herter, DO   10 mg at 02/11/23 0620   fluticasone furoate-vilanterol (BREO ELLIPTA) 200-25 MCG/ACT 1 puff  1 puff Inhalation Daily Carollee Herter, DO   1 puff at 02/11/23 0942   furosemide (LASIX) injection 80 mg  80 mg Intravenous BID Chandrasekhar, Mahesh A, MD       heparin ADULT infusion 100 units/mL (25000 units/289mL)  1,100 Units/hr Intravenous Continuous Coralie Keens, MD 11 mL/hr at 02/11/23 0934 1,100 Units/hr at 02/11/23 0934   hydrALAZINE (APRESOLINE) tablet 25 mg  25 mg Oral TID Carollee Herter, DO   25 mg at 02/11/23 0934   insulin aspart (novoLOG) injection 0-5 Units  0-5 Units Subcutaneous QHS  Carollee Herter, DO       insulin aspart (novoLOG) injection 0-9 Units  0-9 Units Subcutaneous TID WC Carollee Herter, DO   2 Units at 02/11/23 1256   insulin glargine-yfgn [facility]) injection 10 Units  10 Units Subcutaneous Daily Carollee Herter, DO   10 Units at 02/11/23 6295   ipratropium-albuterol (DUONEB) 0.5-2.5 (3) MG/3ML nebulizer solution 3 mL  3 mL Nebulization Q4H PRN Carollee Herter, DO   3 mL at 02/11/23 1259   isosorbide mononitrate (IMDUR) 24 hr tablet 30 mg  30 mg Oral Daily Carollee Herter, DO   30 mg at 02/11/23 2841   linaclotide (LINZESS) capsule 72 mcg  72 mcg Oral q morning Carollee Herter, DO   72 mcg at  02/11/23 3244   pantoprazole (PROTONIX) EC tablet 40 mg  40 mg Oral Daily Carollee Herter, DO   40 mg at 02/11/23 0933   rosuvastatin (CRESTOR) tablet 20 mg  20 mg Oral QHS Carollee Herter, DO   20 mg at 02/11/23 0038   umeclidinium bromide (INCRUSE ELLIPTA) 62.5 MCG/ACT 1 puff  1 puff Inhalation Daily Carollee Herter, DO   1 puff at 02/11/23 0102    Allergies  Allergen Reactions   Aldactone [Spironolactone] Other (See Comments)    Swollen breast   Lipitor [Atorvastatin Calcium] Other (See Comments)    Muscle ache   Codeine Rash   Sulfa Drugs Cross Reactors Other (See Comments)    "raw spots"   Sulfamethoxazole-Trimethoprim Rash    Other reaction(s): Rash      Social History   Socioeconomic History   Marital status: Married    Spouse name: Not on file   Number of children: 2   Years of education: Not on file   Highest education level: Not on file  Occupational History   Not on file  Tobacco Use   Smoking status: Former    Packs/day: 1.00    Years: 20.00    Additional pack years: 0.00    Total pack years: 20.00    Types: Cigarettes    Quit date: 10/27/2001    Years since quitting: 21.3    Passive exposure: Never   Smokeless tobacco: Never  Vaping Use   Vaping Use: Never used  Substance and Sexual Activity   Alcohol use: No    Alcohol/week: 0.0 standard drinks of alcohol   Drug use: No   Sexual activity: Not Currently  Other Topics Concern   Not on file  Social History Narrative   Not on file   Social Determinants of Health   Financial Resource Strain: Not on file  Food Insecurity: No Food Insecurity (01/21/2023)   Hunger Vital Sign    Worried About Running Out of Food in the Last Year: Never true    Ran Out of Food in the Last Year: Never true  Transportation Needs: No Transportation Needs (01/21/2023)   PRAPARE - Administrator, Civil Service (Medical): No    Lack of Transportation (Non-Medical): No  Physical Activity: Not on file  Stress: Not on file  Social  Connections: Not on file  Intimate Partner Violence: Not At Risk (01/21/2023)   Humiliation, Afraid, Rape, and Kick questionnaire    Fear of Current or Ex-Partner: No    Emotionally Abused: No    Physically Abused: No    Sexually Abused: No      Family History  Problem Relation Age of Onset   Heart attack Mother    Diabetes Mother  Heart disease Mother    Hypertension Mother    Heart attack Father    Heart disease Father    Hypertension Father    Diabetes Brother    Diabetes Sister    Diabetes Daughter    Heart disease Daughter    Hypertension Daughter    Diabetes Sister    Breast cancer Neg Hx    Prostate cancer Neg Hx    Colon cancer Neg Hx    Pancreatic cancer Neg Hx     PHYSICAL EXAM: Vitals:   02/11/23 0804 02/11/23 1242  BP: (!) 140/63   Pulse:  62  Resp:    Temp: 97.8 F (36.6 C) 97.8 F (36.6 C)  SpO2:  93%   GENERAL: Chronically ill-appearing white male laying in bed HEENT: Negative for arcus senilis or xanthelasma. There is no scleral icterus.  The mucous membranes are pink and moist.   NECK: Supple, No masses. Normal carotid upstrokes without bruits. No masses or thyromegaly.    CHEST: There are no chest wall deformities. There is no chest wall tenderness. Respirations are unlabored.  Lungs-crackles at bases CARDIAC:  JVP: 12-14 cm H2O         Normal S1, S2  Normal rate with regular rhythm. No murmurs, rubs or gallops.  Pulses are 2+ and symmetrical in upper and lower extremities.  3+ edema.  ABDOMEN: Soft, non-tender, non-distended. There are no masses or hepatomegaly. There are normal bowel sounds.  EXTREMITIES: Warm and well perfused with no cyanosis, clubbing.  LYMPHATIC: No axillary or supraclavicular lymphadenopathy.  NEUROLOGIC: Patient is oriented x3 with no focal or lateralizing neurologic deficits.  PSYCH: Patients affect is appropriate, there is no evidence of anxiety or depression.  SKIN: Warm and dry; no lesions or wounds.   DATA  REVIEW  ECG: Sinus rhythm as per my personal interpretation  ECHO: 01/02/23: LVEF 35 to 40% as per my personal interpretation   ASSESSMENT & PLAN:  Acute on chronic heart failure with reduced ejection fraction exacerbation -Severely volume overloaded on exam likely due to inadequate outpatient diuresis. -Continue IV Lasix 80 mg twice daily. -Increase hydralazine to 37.5 mg 3 times daily, continue Imdur 30 mg daily. -Add metolazone  x1  -Start potassium PO BID.   2.  Persistent atrial flutter -Now normal sinus rhythm.  Will continue amiodarone 100 mg daily for the time being.  Plan to start bisoprolol once euvolemic. -Start apixaban 5 mg twice daily tomorrow  3.  CAD status post CABG -Holding off on left heart cath due to advanced CKD  4.CAS - S/p b/l CEA - continue statin   5. PAD - s/p intervention 2015 Rt SFA and Rt politeal artery vascular stents - cotninue statn    6.HTN - HF therapy as above   7.DM 2 - continue SGLT2i - appreciate TRH recs   8. COPD/emphysema - on 2 L home O2 - euvolemic bisoprolol start - appreciate TRH REcs  [name] Advanced Heart Failure Mechanical Circulatory Support

## 2023-02-11 NOTE — Progress Notes (Signed)
Orthopedic Tech Progress Note Patient Details:  [name] 02/17/1946 [id number]  Ortho Devices Type of Ortho Device: Radio broadcast assistant Ortho Device/Splint Location: Bi LE Ortho Device/Splint Interventions: Application   Post Interventions Patient Tolerated: Well  [name] 02/11/2023, 6:24 PM

## 2023-02-12 ENCOUNTER — Other Ambulatory Visit: Payer: Self-pay

## 2023-02-12 DIAGNOSIS — J439 Emphysema, unspecified: Secondary | ICD-10-CM

## 2023-02-12 DIAGNOSIS — I5023 Acute on chronic systolic (congestive) heart failure: Secondary | ICD-10-CM | POA: Diagnosis not present

## 2023-02-12 DIAGNOSIS — N189 Chronic kidney disease, unspecified: Secondary | ICD-10-CM

## 2023-02-12 DIAGNOSIS — N179 Acute kidney failure, unspecified: Secondary | ICD-10-CM | POA: Diagnosis not present

## 2023-02-12 DIAGNOSIS — I4892 Unspecified atrial flutter: Secondary | ICD-10-CM | POA: Diagnosis not present

## 2023-02-12 DIAGNOSIS — E1169 Type 2 diabetes mellitus with other specified complication: Secondary | ICD-10-CM | POA: Diagnosis not present

## 2023-02-12 LAB — BASIC METABOLIC PANEL
Anion gap: 9 (ref 5–15)
BUN: 45 mg/dL — ABNORMAL HIGH (ref 8–23)
CO2: 23 mmol/L (ref 22–32)
Calcium: 8.6 mg/dL — ABNORMAL LOW (ref 8.9–10.3)
Chloride: 100 mmol/L (ref 98–111)
Creatinine, Ser: 2.6 mg/dL — ABNORMAL HIGH (ref 0.61–1.24)
GFR, Estimated: 25 mL/min — ABNORMAL LOW (ref >60)
Glucose, Bld: 174 mg/dL — ABNORMAL HIGH (ref 70–99)
Potassium: 4.6 mmol/L (ref 3.5–5.1)
Sodium: 132 mmol/L — ABNORMAL LOW (ref 135–145)

## 2023-02-12 LAB — GLUCOSE, CAPILLARY
Glucose-Capillary: 136 mg/dL — ABNORMAL HIGH (ref 70–99)
Glucose-Capillary: 198 mg/dL — ABNORMAL HIGH (ref 70–99)
Glucose-Capillary: 175 mg/dL — ABNORMAL HIGH (ref 70–99)
Glucose-Capillary: 206 mg/dL — ABNORMAL HIGH (ref 70–99)

## 2023-02-12 LAB — CBC
HCT: 39.8 % (ref 39.0–52.0)
Hemoglobin: 12.5 g/dL — ABNORMAL LOW (ref 13.0–17.0)
MCH: 27.6 pg (ref 26.0–34.0)
MCHC: 31.4 g/dL (ref 30.0–36.0)
MCV: 87.9 fL (ref 80.0–100.0)
Platelets: 181 10*3/uL (ref 150–400)
RBC: 4.53 MIL/uL (ref 4.22–5.81)
RDW: 20.7 % — ABNORMAL HIGH (ref 11.5–15.5)
WBC: 5.5 10*3/uL (ref 4.0–10.5)
nRBC: 0 % (ref 0.0–0.2)

## 2023-02-12 LAB — MAGNESIUM: Magnesium: 2.9 mg/dL — ABNORMAL HIGH (ref 1.7–2.4)

## 2023-02-12 MED ORDER — APIXABAN 5 MG PO TABS
5.0000 mg | ORAL_TABLET | Freq: Two times a day (BID) | ORAL | Status: DC
  Administered 2023-02-12 – 2023-02-18 (×13): 5 mg via ORAL
  Filled 2023-02-12 (×13): qty 1

## 2023-02-12 MED ORDER — PHENOL 1.4 % MT LIQD: 1.0000 | OROMUCOSAL | Status: DC | PRN

## 2023-02-12 MED ORDER — HEPARIN SODIUM (PORCINE) 5000 UNIT/ML IJ SOLN
5000.0000 [IU] | Freq: Three times a day (TID) | INTRAMUSCULAR | Status: DC
Start: 2023-02-12 — End: 2023-02-12

## 2023-02-12 MED ORDER — FUROSEMIDE 10 MG/ML IJ SOLN
80.0000 mg | Freq: Two times a day (BID) | INTRAMUSCULAR | Status: DC
  Administered 2023-02-12 – 2023-02-17 (×12): 80 mg via INTRAVENOUS
  Filled 2023-02-12 (×13): qty 8

## 2023-02-12 MED ORDER — ACETAMINOPHEN 325 MG PO TABS
650.0000 mg | ORAL_TABLET | Freq: Four times a day (QID) | ORAL | Status: DC | PRN
  Administered 2023-02-12 – 2023-03-01 (×23): 650 mg via ORAL
  Filled 2023-02-12 (×23): qty 2

## 2023-02-12 MED ORDER — POTASSIUM CHLORIDE CRYS ER 20 MEQ PO TBCR
30.0000 meq | EXTENDED_RELEASE_TABLET | Freq: Once | ORAL | Status: AC
  Administered 2023-02-12: 30 meq via ORAL
  Filled 2023-02-12: qty 1

## 2023-02-12 MED ORDER — METOLAZONE 5 MG PO TABS
5.0000 mg | ORAL_TABLET | Freq: Once | ORAL | Status: AC
  Administered 2023-02-12: 5 mg via ORAL
  Filled 2023-02-12: qty 1

## 2023-02-12 NOTE — Progress Notes (Signed)
Mobility Specialist Progress Note:   02/12/23 1055  Mobility  Activity Ambulated with assistance in hallway  Level of Assistance Contact guard assist, steadying assist  Assistive Device None;Other (Comment) (hallway rails)  Distance Ambulated (ft) 120 ft  Activity Response Tolerated fair  Mobility Referral Yes  $Mobility charge 1 Mobility   Pt agreeable to mobility session. Required steadying assist at times during ambulation. Pt c/o L foot pain d/t swelling. No other complaints. Desat to 82% on 3LO2, requiring 5L and seated rest to recover to 91%. Pt back on 3LO2, sitting EOB. Left with all needs met.   Addison Lank Mobility Specialist Please contact via SecureChat or  Rehab office at [phone]

## 2023-02-12 NOTE — TOC Initial Note (Addendum)
Transition of Care [facility]) - Initial/Assessment Note    Patient Details  Name: [name] MRN: [id number] Date of Birth: May 20, 1946  [facility]) CM/SW Contact:    Elliot Cousin, RN Phone Number: 02/12/2023, 12:25 PM  Clinical Narrative:     readmission, Patient my need HH RN for disease management or HF Paramedicine team.  CM spoke to pt and has oxygen at home with [employer]. He has a portable to use at dc. Pt states he has scale at home for daily weights. Dtr to provide transportation home.     4/19 1244 Sent referral to HF CSW for screening for HF Paramedicine.            Expected Discharge Plan: Home w Home Health Services Barriers to Discharge: Continued Medical Work up   Patient Goals and CMS Choice Patient states their goals for this hospitalization and ongoing recovery are:: wants to get better and return home          Expected Discharge Plan and Services   Discharge Planning Services: CM Consult   Living arrangements for the past 2 months: Single Family Home                                      Prior Living Arrangements/Services Living arrangements for the past 2 months: Single Family Home Lives with:: Spouse Patient language and need for interpreter reviewed:: Yes Do you feel safe going back to the place where you live?: Yes      Need for Family Participation in Patient Care: No (Comment) Care giver support system in place?: Yes (comment) Current home services: DME (oxygen-[employer], scale)    Activities of Daily Living      Permission Sought/Granted Permission sought to share information with : Case Manager, Family Supports, PCP    Share Information with NAME: [name]     Permission granted to share info w Relationship: wife  Permission granted to share info w Contact Information: [phone]  Emotional Assessment       Orientation: : Oriented to Self, Oriented to Place, Oriented to  Time, Oriented to Situation   Psych  Involvement: No (comment)  Admission diagnosis:  Acute on chronic combined systolic (congestive) and diastolic (congestive) heart failure [I50.43] Acute on chronic congestive heart failure, unspecified heart failure type [I50.9] Heart failure [I50.9] Patient Active Problem List   Diagnosis Date Noted   Heart failure 02/11/2023   Acute on chronic systolic CHF (congestive heart failure) 02/10/2023   Acute kidney injury superimposed on chronic kidney disease 02/10/2023   Chronic respiratory failure with hypoxia, on home O2 therapy - on 2 L/min PRN. has concentrator at home. 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/31/2022   Chronic combined systolic and diastolic CHF (congestive heart failure) 10/17/2022   Atrial flutter 10/17/2022   COPD (chronic obstructive pulmonary disease) 10/17/2022   Malignant neoplasm of prostate 11/29/2019   Chronic multifocal osteomyelitis, right ankle and foot 03/23/2018   Diabetic ulcer of toe of right foot associated with type 2 diabetes mellitus, with fat layer exposed 03/05/2018   GERD (gastroesophageal reflux disease) 02/05/2016   Benign essential hypertension 01/28/2016   Type 2 diabetes mellitus with hyperlipidemia 01/28/2016   Peripheral vascular disease, unspecified 01/12/2014   Wound drainage 07/25/2013   Carotid artery  stenosis 07/15/2013   Right leg claudication 07/08/2013   CAD (coronary artery disease) 06/19/2011   Hyperlipidemia associated with type 2 diabetes mellitus 06/19/2011   PCP:  Gaspar Garbe, MD Pharmacy:   [facility]) ELECTRONIC - [place], [place] - [address] [place] [address] Phone: [phone] Fax: [phone]  [facility] [address] [place] - [address] [place] [address] Phone: [phone] Fax:  [phone]  [facility] - [place], [place] - [address] [place] [address] Phone: [phone] Fax: [phone]     Social Determinants of Health (SDOH) Social History: SDOH Screenings   Food Insecurity: No Food Insecurity (01/21/2023)  Housing: Low Risk  (01/21/2023)  Transportation Needs: No Transportation Needs (01/21/2023)  Utilities: Not At Risk (01/21/2023)  Tobacco Use: Medium Risk (02/10/2023)   SDOH Interventions:     Readmission Risk Interventions    01/21/2023    4:21 PM  Readmission Risk Prevention Plan  Transportation Screening Complete  Medication Review (RN Care Manager) Complete  HRI or Home Care Consult Complete  Palliative Care Screening Not Applicable  Skilled Nursing Facility Not Applicable

## 2023-02-12 NOTE — Progress Notes (Signed)
Advanced Heart Failure Rounding Note  PCP-Cardiologist: Kristeen Miss, MD   Subjective:    Diuresed yesterday with 80 IV lasix BID + 5 mg metolazone x1. - 1.8 L UOP. Weight unchanged.   Sitting on EOB. Feels fine. Denies CP.   Objective:   Weight Range: 89 kg Body mass index is 28.14 kg/m.   Vital Signs:   Temp:  [97.6 F (36.4 C)-98.2 F (36.8 C)] 98.2 F (36.8 C) (04/18 0833) Pulse Rate:  [62-79] 77 (04/18 0833) Resp:  [16-24] 18 (04/18 0833) BP: (118-139)/(50-84) 131/50 (04/18 0833) SpO2:  [89 %-95 %] 95 % (04/18 0833) Last BM Date : 02/09/23  Weight change: Filed Weights   02/10/23 1202 02/10/23 1910 02/11/23 0510  Weight: 88.9 kg 88.9 kg 89 kg    Intake/Output:   Intake/Output Summary (Last 24 hours) at 02/12/2023 0922 Last data filed at 02/12/2023 0619 Gross per 24 hour  Intake 693.46 ml  Output 1885 ml  Net -1191.54 ml    Physical Exam  General:  chronically ill appearing.   HEENT: +Johnson, + glasses Neck: supple. JVD to jaw. Carotids 2+ bilat; no bruits. No lymphadenopathy or thyromegaly appreciated. Cor: PMI nondisplaced. Regular rate & rhythm. No rubs, gallops or murmurs. Lungs: diminished Abdomen: soft, nontender, nondistended. No hepatosplenomegaly. No bruits or masses. Good bowel sounds. Extremities: no cyanosis, clubbing, rash, +3 BLE edema. + UNNA boots Neuro: alert & oriented x 3, cranial nerves grossly intact. moves all 4 extremities w/o difficulty. Affect pleasant.   Telemetry   NSR 70s (Personally reviewed)    EKG    No new EKG to review  Labs    CBC Recent Labs    02/11/23 0106 02/12/23 0035  WBC 5.2 5.5  HGB 11.7* 12.5*  HCT 37.4* 39.8  MCV 88.0 87.9  PLT 173 181   Basic Metabolic Panel Recent Labs    16/10/96 0106 02/12/23 0035  NA 131* 132*  K 3.6 4.6  CL 102 100  CO2 22 23  GLUCOSE 116* 174*  BUN 44* 45*  CREATININE 2.32* 2.60*  CALCIUM 8.5* 8.6*  MG 3.1* 2.9*   Liver Function Tests Recent Labs     02/10/23 2007 02/11/23 0106  AST 30 29  ALT 26 25  ALKPHOS 86 85  BILITOT 0.7 0.5  PROT 6.8 6.3*  ALBUMIN 3.4* 3.0*   No results for input(s): "LIPASE", "AMYLASE" in the last 72 hours. Cardiac Enzymes No results for input(s): "CKTOTAL", "CKMB", "CKMBINDEX", "TROPONINI" in the last 72 hours.  BNP: BNP (last 3 results) Recent Labs    01/20/23 1357 02/02/23 1130 02/10/23 1302  BNP 1,939.2* 2,275.8* 2,508.7*    ProBNP (last 3 results) No results for input(s): "PROBNP" in the last 8760 hours.   D-Dimer No results for input(s): "DDIMER" in the last 72 hours. Hemoglobin A1C No results for input(s): "HGBA1C" in the last 72 hours. Fasting Lipid Panel No results for input(s): "CHOL", "HDL", "LDLCALC", "TRIG", "CHOLHDL", "LDLDIRECT" in the last 72 hours. Thyroid Function Tests No results for input(s): "TSH", "T4TOTAL", "T3FREE", "THYROIDAB" in the last 72 hours.  Invalid input(s): "FREET3"  Other results:   Imaging    No results found.   Medications:     Scheduled Medications:  amiodarone  100 mg Oral Daily   apixaban  5 mg Oral BID   dapagliflozin propanediol  10 mg Oral QAC breakfast   fluticasone furoate-vilanterol  1 puff Inhalation Daily   furosemide  80 mg Intravenous BID   hydrALAZINE  37.5 mg Oral TID   insulin aspart  0-5 Units Subcutaneous QHS   insulin aspart  0-9 Units Subcutaneous TID WC   insulin glargine-yfgn  10 Units Subcutaneous Daily   isosorbide mononitrate  30 mg Oral Daily   linaclotide  72 mcg Oral q morning   pantoprazole  40 mg Oral Daily   potassium chloride  30 mEq Oral Once   rosuvastatin  20 mg Oral QHS   umeclidinium bromide  1 puff Inhalation Daily    Infusions:   PRN Medications: ALPRAZolam, ipratropium-albuterol   Patient Profile   Mr. Lofstrom is a 77 y.o. male with CAD c/p CABG 2003, HFrEF, chronic DOE, Rt CEA >71yrs ago, Lt CEA 14', HTN, DM2, HLD, PAD w/ Rt SFA and Rt politeal artery vascular stents 15', prostate  cancer. Admitted with a/c HFrEF.  Assessment/Plan  Acute on chronic heart failure with reduced ejection fraction exacerbation -Severely volume overloaded on exam likely due to inadequate outpatient diuresis. -Continue IV Lasix 80 mg twice daily. Consider inotropic support? -Repeat metolazone  x1  -Continue hydralazine 37.5 mg 3 times daily, continue Imdur 30 mg daily. -Continue Farxiga 10 mg daily -Strict I&O, daily weights  2.  Persistent atrial flutter -Now normal sinus rhythm.  Will continue amiodarone 100 mg daily for the time being.  Plan to start bisoprolol once euvolemic. -Start apixaban 5 mg BID   3.  CAD status post CABG -Holding off on left heart cath due to advanced CKD   4.CAS - S/p b/l CEA - continue statin   5. PAD - s/p intervention 2015 Rt SFA and Rt politeal artery vascular stents - continue statin    6.HTN - therapy as above   7.DM 2 - continue SGLT2i - appreciate TRH recs   8. COPD/emphysema - on 2 L home O2 - once euvolemic start bisoprolol  - appreciate TRH recs  Length of Stay: 1  Alen Bleacher, NP  02/12/2023, 9:22 AM  Advanced Heart Failure Team Pager [phone] (M-F; 7a - 5p)  Please contact [facility] for night-coverage after hours (5p -7a ) and weekends on [email or web address]

## 2023-02-12 NOTE — Progress Notes (Signed)
Progress Note   Patient: [name] MQK:[id number] DOB: 01/23/1946 DOA: 02/10/2023     1 DOS: the patient was seen and examined on 02/12/2023   Brief hospital course: [name] was admitted to the hospital with the working diagnosis of heart failure exacerbation.   77 yo male with the past medical history of heart failure, T2DM, CKD, COPD chronic hypoxemic respiratory failure and hypertension who presented with lower extremity edema. Recent hospitalization 03/26 to 01/24/23 for heart failure exacerbation. He was discharged with 20 mg furosemide. At home he developed worsening lower extremity edema. Cardiology outpatient follow up he had gain 5 lbs from the time of his discharge. At home he continue to have worsening edema, and weight gain for 9 lbs more. On his initial physical examination his blood pressure was 129/82, HR 86, RR 20 and 02 saturation 92%, lungs with bilateral rales, more left than right, positive JVD, heart with S1 and S2 present and rhythmic, abdomen with no distention, positive lower extremity edema pitting.   Na 135, K 4,0 Cl 102 bicarbonate 24 glucose 169, bun 49 cr 2,32  BNP 2,508  Wbc 5,9 hgb 11,8 plt 184   Chest radiograph with cardiomegaly, bilateral hilar vascular congestion, with bilateral interstitial infiltrates, small bilateral pleural effusions, and fluid in the right fissure.  Sternotomy wires in place.   EKG 82 bpm, normal axis, right bundle branch block, sinus rhythm with 1st degree AV block, with no significant ST segment or  T wave changes.   Patient has been placed on furosemide for diuresis.   Assessment and Plan: * Acute on chronic systolic CHF (congestive heart failure) Echocardiogram with reduced LV systolic function with EF 35 to 40%, no LVH, RV systolic function preserved, RVSP 45,5 mid dilatation of LA, no pericardial effusion, mild to moderate TR, mild aortic stenosis,   Unna boots in place with improvement of peripheral edema.  Urine output  1,885 ml. Systolic blood pressure 126 to 131 mmHg.  Plan to continue diuresis with furosemide 80 mg IV q12 hrs  Dapagliflozin, hydralazine and isosorbide.  Limited pharmacology therapy due to reduced GFR  04/17 and 04/18 Metolazone 5 mg.   Acute kidney injury superimposed on chronic kidney disease CKD stage 3a, Hyponatremia.   Continue to have hypervolemia, but improved compared to yesterday. Renal function today with serum cr at 2,60 with K at 4,6 and serum bicarbonate at 23. Na 132 and mg 2,9  On furosemide and intermittent metolazone for diuresis.  Follow up renal function in am.  30 meq Kcl today, caution with repletion due to risk of hyperkalemia.   Atrial flutter Continue amiodarone for rate and rhythm control.  Anticoagulation with apixaban.  Continue telemetry monitoring.    GERD (gastroesophageal reflux disease) Continue protonix 40 mg daily.  Type 2 diabetes mellitus with hyperlipidemia Continue insulin therapy with siding scale and basal 10 units. Fasting glucose today is 174  Continue statin therapy.   COPD (chronic obstructive pulmonary disease) No clinical signs of exacerbation. Continue with bronchodilator therapy.  Chronic hypoxemic respiratory failure, continue with supplemental 02 per [name].   DNR (do not resuscitate) Verified pt is a DNR. Pt is emphatic that he is a DNR/DNI.        Subjective: Patient with improvement in edema and dyspnea but not back to baseline, no chest pain.   Physical Exam: Vitals:   02/12/23 0422 02/12/23 0757 02/12/23 0833 02/12/23 1013  BP: 139/62  (!) 131/50 126/60  Pulse: 78 79 77 75  Resp: 20 (!) Temp: 97.7 F (36.5 C)  98.2 F (36.8 C) 98.2 F (36.8 C)  TempSrc: Oral  Oral Oral  SpO2: 93% 92% 95% 99%  Weight:      Height:       Neurology awake and alert ENT with mild pallor Cardiovascular with S1 and S2 present with no gallops, rubs or murmurs No JVD Positive lower extremity edema pitting  ++ Respiratory with rales at bases with no wheezing or rhonchi,  Abdomen with no distention  Data Reviewed:    Family Communication: no family at the bedside   Disposition: Status is: Inpatient Remains inpatient appropriate because: heart failure   Planned Discharge Destination: Home     Author: Coralie Keens, MD 02/12/2023 11:51 AM  For on call review [email or web address].

## 2023-02-13 ENCOUNTER — Inpatient Hospital Stay (HOSPITAL_COMMUNITY): Payer: Medicare HMO

## 2023-02-13 DIAGNOSIS — I5023 Acute on chronic systolic (congestive) heart failure: Secondary | ICD-10-CM | POA: Diagnosis not present

## 2023-02-13 DIAGNOSIS — J439 Emphysema, unspecified: Secondary | ICD-10-CM | POA: Diagnosis not present

## 2023-02-13 DIAGNOSIS — N179 Acute kidney failure, unspecified: Secondary | ICD-10-CM | POA: Diagnosis not present

## 2023-02-13 DIAGNOSIS — I4892 Unspecified atrial flutter: Secondary | ICD-10-CM | POA: Diagnosis not present

## 2023-02-13 LAB — BASIC METABOLIC PANEL
Anion gap: 10 (ref 5–15)
BUN: 54 mg/dL — ABNORMAL HIGH (ref 8–23)
CO2: 22 mmol/L (ref 22–32)
Calcium: 8.8 mg/dL — ABNORMAL LOW (ref 8.9–10.3)
Chloride: 99 mmol/L (ref 98–111)
Creatinine, Ser: 2.75 mg/dL — ABNORMAL HIGH (ref 0.61–1.24)
GFR, Estimated: 23 mL/min — ABNORMAL LOW (ref >60)
Glucose, Bld: 161 mg/dL — ABNORMAL HIGH (ref 70–99)
Potassium: 4.6 mmol/L (ref 3.5–5.1)
Sodium: 131 mmol/L — ABNORMAL LOW (ref 135–145)

## 2023-02-13 LAB — CBC
HCT: 40.5 % (ref 39.0–52.0)
Hemoglobin: 12.1 g/dL — ABNORMAL LOW (ref 13.0–17.0)
MCH: 27.1 pg (ref 26.0–34.0)
MCHC: 29.9 g/dL — ABNORMAL LOW (ref 30.0–36.0)
MCV: 90.6 fL (ref 80.0–100.0)
Platelets: 149 10*3/uL — ABNORMAL LOW (ref 150–400)
RBC: 4.47 MIL/uL (ref 4.22–5.81)
RDW: 20.3 % — ABNORMAL HIGH (ref 11.5–15.5)
WBC: 4.8 10*3/uL (ref 4.0–10.5)
nRBC: 0 % (ref 0.0–0.2)

## 2023-02-13 LAB — MAGNESIUM: Magnesium: 2.6 mg/dL — ABNORMAL HIGH (ref 1.7–2.4)

## 2023-02-13 LAB — GLUCOSE, CAPILLARY
Glucose-Capillary: 141 mg/dL — ABNORMAL HIGH (ref 70–99)
Glucose-Capillary: 213 mg/dL — ABNORMAL HIGH (ref 70–99)
Glucose-Capillary: 136 mg/dL — ABNORMAL HIGH (ref 70–99)
Glucose-Capillary: 267 mg/dL — ABNORMAL HIGH (ref 70–99)

## 2023-02-13 LAB — COOXEMETRY PANEL
Carboxyhemoglobin: 1.8 % — ABNORMAL HIGH (ref 0.5–1.5)
Methemoglobin: <0.7 % (ref 0.0–1.5)
O2 Saturation: 57.1 %
Total hemoglobin: 11.8 g/dL — ABNORMAL LOW (ref 12.0–16.0)

## 2023-02-13 MED ORDER — SODIUM CHLORIDE 0.9% FLUSH
10.0000 mL | Freq: Two times a day (BID) | INTRAVENOUS | Status: DC
  Administered 2023-02-13 – 2023-02-16 (×7): 10 mL
  Administered 2023-02-17: 20 mL
  Administered 2023-02-17 – 2023-03-04 (×26): 10 mL

## 2023-02-13 MED ORDER — SODIUM CHLORIDE 0.9% FLUSH: 10.0000 mL | INTRAVENOUS | Status: DC | PRN

## 2023-02-13 MED ORDER — DOBUTAMINE-DEXTROSE 4-5 MG/ML-% IV SOLN
2.5000 ug/kg/min | INTRAVENOUS | Status: DC
  Administered 2023-02-13: 2.5 ug/kg/min via INTRAVENOUS
  Filled 2023-02-13: qty 250

## 2023-02-13 MED ORDER — CHLORHEXIDINE GLUCONATE CLOTH 2 % EX PADS
6.0000 | MEDICATED_PAD | Freq: Every day | CUTANEOUS | Status: DC
  Administered 2023-02-13 – 2023-03-04 (×21): 6 via TOPICAL

## 2023-02-13 MED ORDER — ALPRAZOLAM 0.5 MG PO TABS
0.5000 mg | ORAL_TABLET | Freq: Two times a day (BID) | ORAL | Status: DC | PRN
  Administered 2023-02-18 – 2023-02-25 (×6): 0.5 mg via ORAL
  Filled 2023-02-13 (×8): qty 1

## 2023-02-13 MED ORDER — METOLAZONE 5 MG PO TABS
5.0000 mg | ORAL_TABLET | Freq: Once | ORAL | Status: AC
  Administered 2023-02-13: 5 mg via ORAL
  Filled 2023-02-13: qty 1

## 2023-02-13 NOTE — Progress Notes (Signed)
Progress Note   Patient: ALEXANDE SHEERIN [id number]:[id number] DOB: [birth year] DOA: 02/10/2023     2 DOS: the patient was seen and examined on 02/13/2023   Brief hospital course: Mr. Dohrman was admitted to the hospital with the working diagnosis of heart failure exacerbation.   77 yo male with the past medical history of heart failure, T2DM, CKD, COPD chronic hypoxemic respiratory failure and hypertension who presented with lower extremity edema. Recent hospitalization 03/26 to 01/24/23 for heart failure exacerbation. He was discharged with 20 mg furosemide. At home he developed worsening lower extremity edema. Cardiology outpatient follow up he had gain 5 lbs from the time of his discharge. At home he continue to have worsening edema, and weight gain for 9 lbs more. On his initial physical examination his blood pressure was 129/82, HR 86, RR 20 and 02 saturation 92%, lungs with bilateral rales, more left than right, positive JVD, heart with S1 and S2 present and rhythmic, abdomen with no distention, positive lower extremity edema pitting.   Na 135, K 4,0 Cl 102 bicarbonate 24 glucose 169, bun 49 cr 2,32  BNP 2,508  Wbc 5,9 hgb 11,8 plt 184   Chest radiograph with cardiomegaly, bilateral hilar vascular congestion, with bilateral interstitial infiltrates, small bilateral pleural effusions, and fluid in the right fissure.  Sternotomy wires in place.   EKG 82 bpm, normal axis, right bundle branch block, sinus rhythm with 1st degree AV block, with no significant ST segment or  T wave changes.   Patient has been placed on furosemide for diuresis.   04/19 volume status is improving but not yet back to baseline.   Assessment and Plan: * Acute on chronic systolic CHF (congestive heart failure) Echocardiogram with reduced LV systolic function with EF 35 to 40%, no LVH, RV systolic function preserved, RVSP 45,5 mid dilatation of LA, no pericardial effusion, mild to moderate TR, mild aortic stenosis,   Unna  boots in place with improvement of peripheral edema.  Urine output 4,100  ml. Systolic blood pressure 115 to 132 mmHg. SVO2 57,2%   Plan to continue diuresis with furosemide 80 mg IV q12 hrs  Dapagliflozin, hydralazine and isosorbide.  Limited pharmacology therapy due to reduced GFR  04/17, 4/18, 4/19  Metolazone 5 mg.   Acute kidney injury superimposed on chronic kidney disease CKD stage 3a, Hyponatremia.   Renal function with serum cr at 2,75 with K at 4,6 and serum bicarbonate at 22.  Na 131.   On furosemide and intermittent metolazone for diuresis.  Follow up renal function in am.   Atrial flutter Continue amiodarone for rate and rhythm control.  Anticoagulation with apixaban.  Continue telemetry monitoring.    GERD (gastroesophageal reflux disease) Continue protonix 40 mg daily.  Type 2 diabetes mellitus with hyperlipidemia Continue insulin therapy with siding scale and basal 10 units. Fasting glucose today is 161  Continue statin therapy.   COPD (chronic obstructive pulmonary disease) No clinical signs of exacerbation. Continue with bronchodilator therapy.  Chronic hypoxemic respiratory failure, continue with supplemental 02 per [place].   DNR (do not resuscitate) Verified pt is a DNR. Pt is emphatic that he is a DNR/DNI.        Subjective: Patient with improvement in edema but not back to his baseline, dyspnea is improving, this am has mild somnolence, his daughter is at the bedside   Physical Exam: Vitals:   02/13/23 0006 02/13/23 0555 02/13/23 1003 02/13/23 1143  BP: (!) 112/44 (!) 114/54 132/62 (!) 115/50  Pulse: 75 83  83  Resp: 18 (!) 21  18  Temp: 98.2 F (36.8 C) 98.2 F (36.8 C)  98 F (36.7 C)  TempSrc: Oral Oral  Oral  SpO2: 93% 92%  93%  Weight:  86.5 kg    Height:       Neurology awake and alert, mild somnolence.  ENT with mild pallor Cardiovascular with S1 and S2 present and rhythmic with no gallops, rubs or murmurs Mild  JVD Positive lower extremity edema ++ unna boots in place. Respiratory with rales at bases with no wheezing or rhonchi Abdomen with no distention  Data Reviewed:    Family Communication: I spoke with patient's daughter at the bedside, we talked in detail about patient's condition, plan of care and prognosis and all questions were addressed.   Disposition: Status is: Inpatient Remains inpatient appropriate because: heart failure and IV diuresis   Planned Discharge Destination: Home  Author: Coralie Keens, MD 02/13/2023 12:47 PM  For on call review [email or web address].

## 2023-02-13 NOTE — Progress Notes (Signed)
Mobility Specialist Progress Note:   02/13/23 1000  Mobility  Activity Ambulated with assistance in hallway  Level of Assistance Minimal assist, patient does 75% or more  Assistive Device None  Distance Ambulated (ft) 200 ft  Activity Response Tolerated well  Mobility Referral Yes  $Mobility charge 1 Mobility   Pt agreeable to mobility session. Required up to minA during ambulation d/t x1 LOB. 6LO2 needed to maintain SpO2 WFL, titrated back down to 3LO2. Pt left sitting in chair with all needs met.   Addison Lank Mobility Specialist Please contact via SecureChat or  Rehab office at [phone]

## 2023-02-13 NOTE — Progress Notes (Addendum)
Advanced Heart Failure Rounding Note  PCP-Cardiologist: Connor Miss, MD   Subjective:    Diuresed yesterday with 80 IV lasix BID + 5 mg metolazone x1. Weight down 6lbs. -4.1L UOP  Looks good this morning. Daughter at bedside. Ambulated down the hall. Denies CP/SOB.   Objective:   Weight Range: 86.5 kg Body mass index is 27.35 kg/m.   Vital Signs:   Temp:  [98.2 F (36.8 C)] 98.2 F (36.8 C) (04/19 0555) Pulse Rate:  [73-83] 83 (04/19 0555) Resp:  [18-21] 21 (04/19 0555) BP: (112-140)/(44-64) 114/54 (04/19 0555) SpO2:  [92 %-99 %] 92 % (04/19 0555) Weight:  [86.5 kg] 86.5 kg (04/19 0555) Last BM Date : 02/11/23  Weight change: Filed Weights   02/10/23 1910 02/11/23 0510 02/13/23 0555  Weight: 88.9 kg 89 kg 86.5 kg    Intake/Output:   Intake/Output Summary (Last 24 hours) at 02/13/2023 0808 Last data filed at 02/13/2023 2956 Gross per 24 hour  Intake 780 ml  Output 4100 ml  Net -3320 ml    Physical Exam  General:  elderly appearing.  No respiratory difficulty HEENT: normal Neck: supple. JVD ~16 cm. Carotids 2+ bilat; no bruits. No lymphadenopathy or thyromegaly appreciated. Cor: PMI nondisplaced. Regular rate & rhythm. No rubs, gallops or murmurs. Lungs: diminished Abdomen: soft, nontender, nondistended. No hepatosplenomegaly. No bruits or masses. Good bowel sounds. Extremities: no cyanosis, clubbing, rash, +2 BLE edema + UNNA boots PICC RUE Neuro: alert & oriented x 3, cranial nerves grossly intact. moves all 4 extremities w/o difficulty. Affect pleasant.   Telemetry   NSR 70s (Personally reviewed)    EKG    No new EKG to review  Labs    CBC Recent Labs    02/12/23 0035 02/13/23 0059  WBC 5.5 4.8  HGB 12.5* 12.1*  HCT 39.8 40.5  MCV 87.9 90.6  PLT 181 149*   Basic Metabolic Panel Recent Labs    21/30/86 0035 02/13/23 0059  NA 132* 131*  K 4.6 4.6  CL 100 99  CO2 23 22  GLUCOSE 174* 161*  BUN 45* 54*  CREATININE 2.60* 2.75*   CALCIUM 8.6* 8.8*  MG 2.9* 2.6*   Liver Function Tests Recent Labs    02/10/23 2007 02/11/23 0106  AST 30 29  ALT 26 25  ALKPHOS 86 85  BILITOT 0.7 0.5  PROT 6.8 6.3*  ALBUMIN 3.4* 3.0*   No results for input(s): "LIPASE", "AMYLASE" in the last 72 hours. Cardiac Enzymes No results for input(s): "CKTOTAL", "CKMB", "CKMBINDEX", "TROPONINI" in the last 72 hours.  BNP: BNP (last 3 results) Recent Labs    01/20/23 1357 02/02/23 1130 02/10/23 1302  BNP 1,939.2* 2,275.8* 2,508.7*    ProBNP (last 3 results) No results for input(s): "PROBNP" in the last 8760 hours.   D-Dimer No results for input(s): "DDIMER" in the last 72 hours. Hemoglobin A1C No results for input(s): "HGBA1C" in the last 72 hours. Fasting Lipid Panel No results for input(s): "CHOL", "HDL", "LDLCALC", "TRIG", "CHOLHDL", "LDLDIRECT" in the last 72 hours. Thyroid Function Tests No results for input(s): "TSH", "T4TOTAL", "T3FREE", "THYROIDAB" in the last 72 hours.  Invalid input(s): "FREET3"  Other results:   Imaging    [place] EKG SITE RITE  Result Date: 02/12/2023 If Site Rite image not attached, placement could not be confirmed due to current cardiac rhythm.    Medications:     Scheduled Medications:  amiodarone  100 mg Oral Daily   apixaban  5 mg Oral  BID   dapagliflozin propanediol  10 mg Oral QAC breakfast   fluticasone furoate-vilanterol  1 puff Inhalation Daily   furosemide  80 mg Intravenous BID   hydrALAZINE  37.5 mg Oral TID   insulin aspart  0-5 Units Subcutaneous QHS   insulin aspart  0-9 Units Subcutaneous TID WC   insulin glargine-yfgn  10 Units Subcutaneous Daily   isosorbide mononitrate  30 mg Oral Daily   linaclotide  72 mcg Oral q morning   pantoprazole  40 mg Oral Daily   rosuvastatin  20 mg Oral QHS   umeclidinium bromide  1 puff Inhalation Daily    Infusions:   PRN Medications: acetaminophen, ALPRAZolam, ipratropium-albuterol, phenol   Patient Profile   [name] is a 77 y.o. male with CAD c/p CABG 2003, HFrEF, chronic DOE, Rt CEA >22yrs ago, Lt CEA 14', HTN, DM2, HLD, PAD w/ Rt SFA and Rt politeal artery vascular stents 15', prostate cancer. Admitted with a/c HFrEF.  Assessment/Plan  Acute on chronic heart failure with reduced ejection fraction exacerbation -Severely volume overloaded on exam likely due to inadequate outpatient diuresis. - Continue IV Lasix 80 mg twice daily.  - Repeat metolazone  x1 after CVP - Continue hydralazine 37.5 mg 3 times daily, continue Imdur 30 mg daily. - PICC just placed follow CVP and co-ox - Continue Farxiga 10 mg daily - Strict I&O, daily weights  2.  Persistent atrial flutter -Now normal sinus rhythm.  Will continue amiodarone 100 mg daily for the time being.  Plan to start bisoprolol once euvolemic. -Continue apixaban 5 mg BID   3.  CAD status post CABG -Holding off on left heart cath due to advanced CKD   4.CAS - S/p b/l CEA - continue statin   5. PAD - s/p intervention 2015 Rt SFA and Rt politeal artery vascular stents - continue statin    6.HTN - therapy as above   7.DM 2 - continue SGLT2i - appreciate TRH recs   8. COPD/emphysema - on 2 L home O2 - once euvolemic start bisoprolol  - appreciate TRH recs  9. AKI on CKD stage 4 - SCr 2.3>2.3>2.6>2.75 - baseline SCr ~1.5-2 - avoid hypotension  Addendum 02/13/23 11:28 AM  PICC placed. CVP 18. Co-ox marginal 57%. Still making good urine, will hold off on inotropic support for now.  Adding metolazone.   Length of Stay: 2  Connor Bleacher, NP  02/13/2023, 8:08 AM  Advanced Heart Failure Team Pager [phone] (M-F; 7a - 5p)  Please contact [facility] for night-coverage after hours (5p -7a ) and weekends on [email or web address]

## 2023-02-13 NOTE — Progress Notes (Signed)
Peripherally Inserted Central Catheter Placement  The IV Nurse has discussed with the patient and/or persons authorized to consent for the patient, the purpose of this procedure and the potential benefits and risks involved with this procedure.  The benefits include less needle sticks, lab draws from the catheter, and the patient may be discharged home with the catheter. Risks include, but not limited to, infection, bleeding, blood clot (thrombus formation), and puncture of an artery; nerve damage and irregular heartbeat and possibility to perform a PICC exchange if needed/ordered by physician.  Alternatives to this procedure were also discussed.  Bard Power PICC patient education guide, fact sheet on infection prevention and patient information card has been provided to patient /or left at bedside.    PICC Placement Documentation  PICC Double Lumen 02/13/23 Right Basilic 39 cm 0 cm (Active)  Indication for Insertion or Continuance of Line Chronic illness with exacerbations (CF, Sickle Cell, etc.) 02/13/23 0900  Exposed Catheter (cm) 0 cm 02/13/23 0900  Site Assessment Clean, Dry, Intact 02/13/23 0900  Lumen #1 Status Flushed;Saline locked;Blood return noted 02/13/23 0900  Lumen #2 Status Flushed;Saline locked;Blood return noted 02/13/23 0900  Dressing Type Transparent;Securing device 02/13/23 0900  Dressing Status Antimicrobial disc in place 02/13/23 0900  Safety Lock Not Applicable 02/13/23 0900  Line Care Connections checked and tightened 02/13/23 0900  Line Adjustment (NICU/IV Team Only) No 02/13/23 0900  Dressing Intervention New dressing 02/13/23 0900  Dressing Change Due 02/20/23 02/13/23 0900       Vernona Rieger  Briarrose Shor 02/13/2023, 9:38 AM

## 2023-02-14 DIAGNOSIS — I472 Ventricular tachycardia, unspecified: Secondary | ICD-10-CM | POA: Diagnosis not present

## 2023-02-14 DIAGNOSIS — I4892 Unspecified atrial flutter: Secondary | ICD-10-CM | POA: Diagnosis not present

## 2023-02-14 DIAGNOSIS — E1169 Type 2 diabetes mellitus with other specified complication: Secondary | ICD-10-CM | POA: Diagnosis not present

## 2023-02-14 DIAGNOSIS — N179 Acute kidney failure, unspecified: Secondary | ICD-10-CM | POA: Diagnosis not present

## 2023-02-14 DIAGNOSIS — I951 Orthostatic hypotension: Secondary | ICD-10-CM

## 2023-02-14 DIAGNOSIS — I5023 Acute on chronic systolic (congestive) heart failure: Secondary | ICD-10-CM | POA: Diagnosis not present

## 2023-02-14 LAB — BASIC METABOLIC PANEL
Anion gap: 14 (ref 5–15)
Anion gap: 17 — ABNORMAL HIGH (ref 5–15)
BUN: 52 mg/dL — ABNORMAL HIGH (ref 8–23)
BUN: 54 mg/dL — ABNORMAL HIGH (ref 8–23)
CO2: 27 mmol/L (ref 22–32)
CO2: 28 mmol/L (ref 22–32)
Calcium: 9.1 mg/dL (ref 8.9–10.3)
Calcium: 8.8 mg/dL — ABNORMAL LOW (ref 8.9–10.3)
Chloride: 89 mmol/L — ABNORMAL LOW (ref 98–111)
Chloride: 87 mmol/L — ABNORMAL LOW (ref 98–111)
Creatinine, Ser: 2.66 mg/dL — ABNORMAL HIGH (ref 0.61–1.24)
Creatinine, Ser: 2.67 mg/dL — ABNORMAL HIGH (ref 0.61–1.24)
GFR, Estimated: 24 mL/min — ABNORMAL LOW (ref >60)
GFR, Estimated: 24 mL/min — ABNORMAL LOW (ref >60)
Glucose, Bld: 361 mg/dL — ABNORMAL HIGH (ref 70–99)
Glucose, Bld: 336 mg/dL — ABNORMAL HIGH (ref 70–99)
Potassium: 3.6 mmol/L (ref 3.5–5.1)
Potassium: 3.4 mmol/L — ABNORMAL LOW (ref 3.5–5.1)
Sodium: 130 mmol/L — ABNORMAL LOW (ref 135–145)
Sodium: 132 mmol/L — ABNORMAL LOW (ref 135–145)

## 2023-02-14 LAB — GLUCOSE, CAPILLARY
Glucose-Capillary: 224 mg/dL — ABNORMAL HIGH (ref 70–99)
Glucose-Capillary: 346 mg/dL — ABNORMAL HIGH (ref 70–99)
Glucose-Capillary: 261 mg/dL — ABNORMAL HIGH (ref 70–99)

## 2023-02-14 LAB — CBC
HCT: 36.4 % — ABNORMAL LOW (ref 39.0–52.0)
Hemoglobin: 11.6 g/dL — ABNORMAL LOW (ref 13.0–17.0)
MCH: 27.4 pg (ref 26.0–34.0)
MCHC: 31.9 g/dL (ref 30.0–36.0)
MCV: 85.8 fL (ref 80.0–100.0)
Platelets: 153 10*3/uL (ref 150–400)
RBC: 4.24 MIL/uL (ref 4.22–5.81)
RDW: 19.8 % — ABNORMAL HIGH (ref 11.5–15.5)
WBC: 4.1 10*3/uL (ref 4.0–10.5)
nRBC: 0 % (ref 0.0–0.2)

## 2023-02-14 LAB — COOXEMETRY PANEL
Carboxyhemoglobin: 2.1 % — ABNORMAL HIGH (ref 0.5–1.5)
Methemoglobin: <0.7 % (ref 0.0–1.5)
O2 Saturation: 67 %
Total hemoglobin: 11.9 g/dL — ABNORMAL LOW (ref 12.0–16.0)

## 2023-02-14 LAB — MAGNESIUM
Magnesium: 2.2 mg/dL (ref 1.7–2.4)
Magnesium: 2 mg/dL (ref 1.7–2.4)

## 2023-02-14 LAB — MRSA NEXT GEN BY PCR, NASAL: MRSA by PCR Next Gen: NOT DETECTED

## 2023-02-14 MED ORDER — POTASSIUM CHLORIDE CRYS ER 20 MEQ PO TBCR
20.0000 meq | EXTENDED_RELEASE_TABLET | Freq: Once | ORAL | Status: AC
  Administered 2023-02-14: 20 meq via ORAL
  Filled 2023-02-14: qty 1

## 2023-02-14 MED ORDER — MELATONIN 3 MG PO TABS
3.0000 mg | ORAL_TABLET | Freq: Every evening | ORAL | Status: DC | PRN
  Administered 2023-02-14: 3 mg via ORAL
  Filled 2023-02-14: qty 1

## 2023-02-14 MED ORDER — AMIODARONE HCL IN DEXTROSE 360-4.14 MG/200ML-% IV SOLN
30.0000 mg/h | INTRAVENOUS | Status: DC
  Administered 2023-02-14 – 2023-02-16 (×5): 30 mg/h via INTRAVENOUS
  Filled 2023-02-14 (×4): qty 200

## 2023-02-14 MED ORDER — AMIODARONE HCL IN DEXTROSE 360-4.14 MG/200ML-% IV SOLN
60.0000 mg/h | INTRAVENOUS | Status: AC
  Administered 2023-02-14: 60 mg/h via INTRAVENOUS
  Filled 2023-02-14 (×2): qty 200

## 2023-02-14 MED ORDER — AMIODARONE LOAD VIA INFUSION
150.0000 mg | Freq: Once | INTRAVENOUS | Status: AC
  Administered 2023-02-14: 150 mg via INTRAVENOUS
  Filled 2023-02-14: qty 83.34

## 2023-02-14 NOTE — Progress Notes (Signed)
   02/14/23 1311  Vitals  Temp 97.9 F (36.6 C)  BP 124/62  MAP (mmHg) 81  Pulse Rate 69  ECG Heart Rate (!) 112  Resp (!) 21  MEWS COLOR  MEWS Score Color Yellow  Oxygen Therapy  SpO2 98 %  MEWS Score  MEWS Temp 0  MEWS Systolic 0  MEWS Pulse 2  MEWS RR 1  MEWS LOC 0  MEWS Score 3   MD noftified, patient having PVCs and tachy. EKG done. Dobutamine drip stopped. Schyler Counsell Lynnell Dike

## 2023-02-14 NOTE — Progress Notes (Signed)
Progress Note   Patient: [name] ZOX:[id number] DOB: November 01, 1945 DOA: 02/10/2023     3 DOS: the patient was seen and examined on 02/14/2023   Brief hospital course: Mr. Leh was admitted to the hospital with the working diagnosis of heart failure exacerbation.   77 yo male with the past medical history of heart failure, T2DM, CKD, COPD chronic hypoxemic respiratory failure and hypertension who presented with lower extremity edema. Recent hospitalization 03/26 to 01/24/23 for heart failure exacerbation. He was discharged with 20 mg furosemide. At home he developed worsening lower extremity edema. Cardiology outpatient follow up he had gain 5 lbs from the time of his discharge. At home he continue to have worsening edema, and weight gain for 9 lbs more. On his initial physical examination his blood pressure was 129/82, HR 86, RR 20 and 02 saturation 92%, lungs with bilateral rales, more left than right, positive JVD, heart with S1 and S2 present and rhythmic, abdomen with no distention, positive lower extremity edema pitting.   Na 135, K 4,0 Cl 102 bicarbonate 24 glucose 169, bun 49 cr 2,32  BNP 2,508  Wbc 5,9 hgb 11,8 plt 184   Chest radiograph with cardiomegaly, bilateral hilar vascular congestion, with bilateral interstitial infiltrates, small bilateral pleural effusions, and fluid in the right fissure.  Sternotomy wires in place.   EKG 82 bpm, normal axis, right bundle branch block, sinus rhythm with 1st degree AV block, with no significant ST segment or  T wave changes.   Patient has been placed on furosemide for diuresis.   04/19 volume status is improving but not yet back to baseline.  Patient placed on dobutamine infusion.  04/20 patient developed VT and was transferred to the intensive care unit 2H per cardiology recommendations.   Assessment and Plan: * Acute on chronic systolic CHF (congestive heart failure) Echocardiogram with reduced LV systolic function with EF 35 to 40%,  no LVH, RV systolic function preserved, RVSP 45,5 mid dilatation of LA, no pericardial effusion, mild to moderate TR, mild aortic stenosis,   Unna boots in place with improvement of peripheral edema.  Urine output 5,750  ml. Systolic blood pressure 105 mmHg. SVO2 67%   Plan to continue diuresis with furosemide 80 mg IV q12 hrs  Dapagliflozin, hydralazine and isosorbide.  Limited pharmacology therapy due to reduced GFR  04/17, 4/18, 4/19  Metolazone 5 mg.   Patient has developed monomorphic ventricular tachycardia, multiple episodes. Dobutamine will be discontinued and patient will be placed on IV amiodarone.  Transfer to intensive care unit.   Acute kidney injury superimposed on chronic kidney disease CKD stage 3a, Hyponatremia.   Volume status is improving.  Renal function with serum cr at 2,6 with K at 3,6 and serum bicarbonate at 27,  On furosemide and intermittent metolazone for diuresis.  Follow up renal function in am.   Atrial flutter Now patient on amiodarone drip for VT  Anticoagulation with apixaban.  Continue telemetry monitoring.    GERD (gastroesophageal reflux disease) Continue protonix 40 mg daily.  Type 2 diabetes mellitus with hyperlipidemia Uncontrolled T2DM with hyperglycemia.   Continue insulin therapy with siding scale and basal 10 units. Fasting glucose today is 361 Continue statin therapy.   COPD (chronic obstructive pulmonary disease) No clinical signs of exacerbation. Continue with bronchodilator therapy.  Chronic hypoxemic respiratory failure, continue with supplemental 02 per [place].   DNR (do not resuscitate) Verified pt is a DNR. Pt is emphatic that he is a DNR/DNI.  Subjective: patient with fatigue with no dyspnea or chest pain, edema is improving, no nausea or vomiting, no chest pain   Physical Exam: Vitals:   02/14/23 0718 02/14/23 0847 02/14/23 1311 02/14/23 1415  BP: 139/62  124/62 105/63  Pulse: 88  69 (!) 134  Resp:  18  (!) 21 19  Temp: 98.2 F (36.8 C)  97.9 F (36.6 C) 99.6 F (37.6 C)  TempSrc: Oral  Oral Oral  SpO2: 93% 97% 98% 98%  Weight:      Height:       Neurology awake and alert ENT with mild pallor Cardiovascular with S1 and S2 present, irregularly irregular with no gallops, rubs or murmurs No jvd Positive lower extremity edema +  Respiratory with no rales or wheezing on anterior auscultation  Abdomen with no distention  Data Reviewed:    Family Communication: I spoke with patient's daughter at the bedside, we talked in detail about patient's condition, plan of care and prognosis and all questions were addressed.   Disposition: Status is: Inpatient Remains inpatient appropriate because: heart failure   Planned Discharge Destination: Home    Author: Coralie Keens, MD 02/14/2023 2:55 PM  For on call review [email or web address].

## 2023-02-14 NOTE — Progress Notes (Addendum)
Advanced Heart Failure Rounding Note  PCP-Cardiologist: Kristeen Miss, MD   Subjective:    Continues to diurese well.  He put out 1.6 L yesterday and is net -11.7 L since admission.  Weight down 15 pounds since admission and 9 pounds since yesterday.  He 130 and serum creatinine slightly improved from 2.75->>2.66  Denies any chest pain or shortness of breath but feels tired and fatigued and is gone back into atrial flutter with RVR and also having 10-12 beat runs of wide-complex tachycardia consistent with monomorphic ventricular tachycardia.  Dobutamine has been turned off but he continues to have VT and hypotension.  Objective:   Weight Range: 82.5 kg Body mass index is 26.1 kg/m.   Vital Signs:   Temp:  [97.5 F (36.4 C)-98.4 F (36.9 C)] 98.2 F (36.8 C) (04/20 0718) Pulse Rate:  [81-88] 88 (04/20 0718) Resp:  [15-19] 18 (04/20 0718) BP: (116-139)/(45-62) 139/62 (04/20 0718) SpO2:  [93 %-98 %] 97 % (04/20 0847) Weight:  [82.5 kg] 82.5 kg (04/20 0448) Last BM Date : 02/11/23  Weight change: Filed Weights   02/11/23 0510 02/13/23 0555 02/14/23 0448  Weight: 89 kg 86.5 kg 82.5 kg    Intake/Output:   Intake/Output Summary (Last 24 hours) at 02/14/2023 1322 Last data filed at 02/14/2023 1300 Gross per 24 hour  Intake 287.14 ml  Output 6300 ml  Net -6012.86 ml     Physical Exam  GEN: Well nourished, well developed in no acute distress HEENT: Normal NECK: No JVD; No carotid bruits LYMPHATICS: No lymphadenopathy CARDIAC:RRR, no murmurs, rubs, gallops RESPIRATORY:  Clear to auscultation without rales, wheezing or rhonchi  ABDOMEN: Soft, non-tender, non-distended MUSCULOSKELETAL:  No edema; No deformity  SKIN: Warm and dry NEUROLOGIC:  Alert and oriented x 3 PSYCHIATRIC:  Normal affect  Telemetry   Normal sinus rhythm (Personally reviewed)    EKG    No new EKG to review  Labs    CBC Recent Labs    02/13/23 0059 02/14/23 0912  WBC 4.8 4.1  HGB  12.1* 11.6*  HCT 40.5 36.4*  MCV 90.6 85.8  PLT 149* 153    Basic Metabolic Panel Recent Labs    16/10/96 0059 02/14/23 0912  NA 131* 130*  K 4.6 3.6  CL 99 89*  CO2 22 27  GLUCOSE 161* 361*  BUN 54* 52*  CREATININE 2.75* 2.66*  CALCIUM 8.8* 9.1  MG 2.6* 2.2    Liver Function Tests No results for input(s): "AST", "ALT", "ALKPHOS", "BILITOT", "PROT", "ALBUMIN" in the last 72 hours.  No results for input(s): "LIPASE", "AMYLASE" in the last 72 hours. Cardiac Enzymes No results for input(s): "CKTOTAL", "CKMB", "CKMBINDEX", "TROPONINI" in the last 72 hours.  BNP: BNP (last 3 results) Recent Labs    01/20/23 1357 02/02/23 1130 02/10/23 1302  BNP 1,939.2* 2,275.8* 2,508.7*     ProBNP (last 3 results) No results for input(s): "PROBNP" in the last 8760 hours.   D-Dimer No results for input(s): "DDIMER" in the last 72 hours. Hemoglobin A1C No results for input(s): "HGBA1C" in the last 72 hours. Fasting Lipid Panel No results for input(s): "CHOL", "HDL", "LDLCALC", "TRIG", "CHOLHDL", "LDLDIRECT" in the last 72 hours. Thyroid Function Tests No results for input(s): "TSH", "T4TOTAL", "T3FREE", "THYROIDAB" in the last 72 hours.  Invalid input(s): "FREET3"  Other results:   Imaging    No results found.   Medications:     Scheduled Medications:  amiodarone  100 mg Oral Daily   apixaban  5 mg Oral BID   Chlorhexidine Gluconate Cloth  6 each Topical Daily   dapagliflozin propanediol  10 mg Oral QAC breakfast   fluticasone furoate-vilanterol  1 puff Inhalation Daily   furosemide  80 mg Intravenous BID   hydrALAZINE  37.5 mg Oral TID   insulin aspart  0-5 Units Subcutaneous QHS   insulin aspart  0-9 Units Subcutaneous TID WC   insulin glargine-yfgn  10 Units Subcutaneous Daily   isosorbide mononitrate  30 mg Oral Daily   linaclotide  72 mcg Oral q morning   pantoprazole  40 mg Oral Daily   rosuvastatin  20 mg Oral QHS   sodium chloride flush  10-40 mL  Intracatheter Q12H   umeclidinium bromide  1 puff Inhalation Daily    Infusions:  DOBUTamine 2.5 mcg/kg/min (02/13/23 1259)    PRN Medications: acetaminophen, ALPRAZolam, ipratropium-albuterol, phenol, sodium chloride flush   Patient Profile   [name] is a 77 y.o. male with CAD c/p CABG 2003, HFrEF, chronic DOE, Rt CEA >11yrs ago, Lt CEA 14', HTN, DM2, HLD, PAD w/ Rt SFA and Rt politeal artery vascular stents 15', prostate cancer. Admitted with a/c HFrEF.  Assessment/Plan  Acute on chronic heart failure with reduced ejection fraction exacerbation - Severely volume overloaded on exam likely due to inadequate outpatient diuresis. - Currently on Lasix 80 mg IV twice daily  - He put out 1.6 L yesterday and is net -11.7 L since admission.  Weight down 15 pounds since admission and 9 pounds since yesterday. - Has been getting intermittent metolazone - Co ox this morning is 67 and CVP 10 - Now back in atrial flutter with RVR and also having runs of ventricular tachycardia up to 12 beats in a row and is very frequent on telemetry despite stopping dobutamine and patient is symptomatic with dizziness and hypotension>> I made Dr. Gasper Lloyd aware - Transfer patient to 2 heart for monitoring due to VT and hypotension - Continue Lasix 80 mg IV twice daily - replete K+ (3.6) with Kdur now - Mag ok at 2.2 - BMET at 6pm - Will keep off of dobutamine due to DVT - hold Farxiga, Hydralazine and Imdur for now due to hypotension - ACE/ARB/ARNI/MRA due to AKI - Strict I&O, daily weights - Keep K+>4 and Mag>2  2.  Persistent atrial flutter/VT -currently appears to be back in aflutter with RVR as well as intermittent episodes of monomorphic VT -will change PO Amio to IV gtt 150 mg IV bolus and then drip at 10 mg/h x 6 hours then 30 mg/h thereafter -Plan to start bisoprolol once euvolemic BP stable -Continue apixaban 5 mg BID   3.  CAD status post CABG -Holding off on left heart cath due to  advanced CKD   4.CAS - S/p b/l CEA - continue statin   5. PAD - s/p intervention 2015 Rt SFA and Rt politeal artery vascular stents - continue statin    6.HTN -BP  had been stable at 124/62 mmHg until he went back into atrial flutter with RVR with multiple episodes of nonsustained monomorphic VT now hypotensive -Hold Imdur and hydralazine for now until blood pressure improves   7.DM 2 - continue SGLT2i - appreciate TRH recs   8. COPD/emphysema - on 2 L home O2 - once euvolemic start bisoprolol  - appreciate TRH recs  9. AKI on CKD stage 4 - baseline SCr ~1.5>2.66 - avoid hypotension  Performed by: Armanda Magic, MD   Total critical care time:  45 minutes   Critical care time was exclusive of separately billable procedures and treating other patients.   Critical care was necessary to treat or prevent imminent or life-threatening deterioration.   Critical care was time spent personally by me (independent of APPs or residents) on the following activities: development of treatment plan with patient and/or surrogate as well as nursing, discussions with consultants, evaluation of patient's response to treatment, examination of patient, obtaining history from patient or surrogate, ordering and performing treatments and interventions, ordering and review of laboratory studies, ordering and review of radiographic studies, pulse oximetry and re-evaluation of patient's condition. Length of Stay: 3  Armanda Magic, MD  02/14/2023, 1:22 PM

## 2023-02-14 NOTE — Plan of Care (Signed)
  Problem: Education: Goal: Knowledge of General Education information will improve Description Including pain rating scale, medication(s)/side effects and non-pharmacologic comfort measures Outcome: Progressing   Problem: Health Behavior/Discharge Planning: Goal: Ability to manage health-related needs will improve Outcome: Progressing   

## 2023-02-15 DIAGNOSIS — Z66 Do not resuscitate: Secondary | ICD-10-CM | POA: Diagnosis not present

## 2023-02-15 DIAGNOSIS — I5023 Acute on chronic systolic (congestive) heart failure: Secondary | ICD-10-CM | POA: Diagnosis not present

## 2023-02-15 DIAGNOSIS — E1169 Type 2 diabetes mellitus with other specified complication: Secondary | ICD-10-CM | POA: Diagnosis not present

## 2023-02-15 DIAGNOSIS — I4892 Unspecified atrial flutter: Secondary | ICD-10-CM | POA: Diagnosis not present

## 2023-02-15 LAB — BASIC METABOLIC PANEL
Anion gap: 15 (ref 5–15)
BUN: 60 mg/dL — ABNORMAL HIGH (ref 8–23)
CO2: 27 mmol/L (ref 22–32)
Calcium: 8.6 mg/dL — ABNORMAL LOW (ref 8.9–10.3)
Chloride: 88 mmol/L — ABNORMAL LOW (ref 98–111)
Creatinine, Ser: 2.8 mg/dL — ABNORMAL HIGH (ref 0.61–1.24)
GFR, Estimated: 23 mL/min — ABNORMAL LOW (ref >60)
Glucose, Bld: 261 mg/dL — ABNORMAL HIGH (ref 70–99)
Potassium: 3.6 mmol/L (ref 3.5–5.1)
Sodium: 130 mmol/L — ABNORMAL LOW (ref 135–145)

## 2023-02-15 LAB — CBC
HCT: 36.3 % — ABNORMAL LOW (ref 39.0–52.0)
Hemoglobin: 11.8 g/dL — ABNORMAL LOW (ref 13.0–17.0)
MCH: 27.5 pg (ref 26.0–34.0)
MCHC: 32.5 g/dL (ref 30.0–36.0)
MCV: 84.6 fL (ref 80.0–100.0)
Platelets: 170 10*3/uL (ref 150–400)
RBC: 4.29 MIL/uL (ref 4.22–5.81)
RDW: 19.3 % — ABNORMAL HIGH (ref 11.5–15.5)
WBC: 5.3 10*3/uL (ref 4.0–10.5)
nRBC: 0 % (ref 0.0–0.2)

## 2023-02-15 LAB — GLUCOSE, CAPILLARY
Glucose-Capillary: 209 mg/dL — ABNORMAL HIGH (ref 70–99)
Glucose-Capillary: 329 mg/dL — ABNORMAL HIGH (ref 70–99)
Glucose-Capillary: 357 mg/dL — ABNORMAL HIGH (ref 70–99)
Glucose-Capillary: 306 mg/dL — ABNORMAL HIGH (ref 70–99)
Glucose-Capillary: 203 mg/dL — ABNORMAL HIGH (ref 70–99)

## 2023-02-15 LAB — COOXEMETRY PANEL
Carboxyhemoglobin: 1.4 % (ref 0.5–1.5)
Methemoglobin: <0.7 % (ref 0.0–1.5)
O2 Saturation: 52.3 %
Total hemoglobin: 12 g/dL (ref 12.0–16.0)

## 2023-02-15 LAB — MAGNESIUM: Magnesium: 2.1 mg/dL (ref 1.7–2.4)

## 2023-02-15 MED ORDER — POTASSIUM CHLORIDE CRYS ER 20 MEQ PO TBCR
40.0000 meq | EXTENDED_RELEASE_TABLET | Freq: Once | ORAL | Status: AC
  Administered 2023-02-15: 40 meq via ORAL
  Filled 2023-02-15: qty 2

## 2023-02-15 MED ORDER — MILRINONE LACTATE IN DEXTROSE 20-5 MG/100ML-% IV SOLN
0.1250 ug/kg/min | INTRAVENOUS | Status: DC
  Administered 2023-02-15: 0.125 ug/kg/min via INTRAVENOUS
  Administered 2023-02-16 – 2023-02-20 (×7): 0.25 ug/kg/min via INTRAVENOUS
  Filled 2023-02-15 (×9): qty 100

## 2023-02-15 MED ORDER — POTASSIUM CHLORIDE CRYS ER 20 MEQ PO TBCR
20.0000 meq | EXTENDED_RELEASE_TABLET | Freq: Once | ORAL | Status: AC
  Administered 2023-02-15: 20 meq via ORAL
  Filled 2023-02-15: qty 1

## 2023-02-15 MED ORDER — INSULIN GLARGINE-YFGN 100 UNIT/ML ~~LOC~~ SOLN
13.0000 [IU] | Freq: Every day | SUBCUTANEOUS | Status: DC
  Administered 2023-02-16: 13 [IU] via SUBCUTANEOUS
  Filled 2023-02-15 (×2): qty 0.13

## 2023-02-15 MED ORDER — TRAZODONE HCL 50 MG PO TABS
50.0000 mg | ORAL_TABLET | Freq: Every evening | ORAL | Status: DC | PRN
  Administered 2023-02-15 – 2023-02-17 (×3): 50 mg via ORAL
  Filled 2023-02-15 (×3): qty 1

## 2023-02-15 MED ORDER — METOLAZONE 5 MG PO TABS
5.0000 mg | ORAL_TABLET | Freq: Once | ORAL | Status: AC
  Administered 2023-02-15: 5 mg via ORAL
  Filled 2023-02-15: qty 1

## 2023-02-15 NOTE — Progress Notes (Signed)
Advanced Heart Failure Rounding Note  PCP-Cardiologist: Kristeen Miss, MD   Subjective:   -Yesterday while on dobutamine patient went into atrial flutter with RVR, became hypotensive.  Decision made to start amiodarone and transferred to CVICU. -Net -1.6 L over the past 24 hours, CVP 14 this morning.  Objective:   Weight Range: 82.5 kg Body mass index is 26.1 kg/m.   Vital Signs:   Temp:  [97.7 F (36.5 C)-99.7 F (37.6 C)] 97.9 F (36.6 C) (04/21 0800) Pulse Rate:  [59-134] 59 (04/21 0900) Resp:  [13-32] 21 (04/21 0900) BP: (93-125)/(50-102) 111/54 (04/21 0900) SpO2:  [87 %-98 %] 92 % (04/21 0900) Last BM Date : 02/15/23  Weight change: Filed Weights   02/11/23 0510 02/13/23 0555 02/14/23 0448  Weight: 89 kg 86.5 kg 82.5 kg    Intake/Output:   Intake/Output Summary (Last 24 hours) at 02/15/2023 0949 Last data filed at 02/15/2023 0925 Gross per 24 hour  Intake 1836.27 ml  Output 2950 ml  Net -1113.73 ml     Physical Exam  General:  elderly appearing.  No respiratory difficulty HEENT: normal Neck: supple.  JVP 14 cm. Carotids 2+ bilat; no bruits. No lymphadenopathy or thyromegaly appreciated. Cor: PMI nondisplaced. Regular rate & rhythm. No rubs, gallops or murmurs. Lungs: diminished Abdomen: soft, nontender, nondistended. No hepatosplenomegaly. No bruits or masses. Good bowel sounds. Extremities: no cyanosis, clubbing, rash, +2 BLE edema + UNNA boots PICC RUE Neuro: alert & oriented x 3, cranial nerves grossly intact. moves all 4 extremities w/o difficulty. Affect pleasant.   Telemetry   Normal sinus rhythm in the 80s this morning  EKG    No new EKG to review  Labs    CBC Recent Labs    02/14/23 0912 02/15/23 0445  WBC 4.1 5.3  HGB 11.6* 11.8*  HCT 36.4* 36.3*  MCV 85.8 84.6  PLT 153 170    Basic Metabolic Panel Recent Labs    40/98/11 1205 02/15/23 0445  NA 132* 130*  K 3.4* 3.6  CL 87* 88*  CO2 28 27  GLUCOSE 336* 261*  BUN 54*  60*  CREATININE 2.67* 2.80*  CALCIUM 8.8* 8.6*  MG 2.0 2.1    BNP (last 3 results) Recent Labs    01/20/23 1357 02/02/23 1130 02/10/23 1302  BNP 1,939.2* 2,275.8* 2,508.7*       Imaging    No results found.   Medications:     Scheduled Medications:  apixaban  5 mg Oral BID   Chlorhexidine Gluconate Cloth  6 each Topical Daily   fluticasone furoate-vilanterol  1 puff Inhalation Daily   furosemide  80 mg Intravenous BID   insulin aspart  0-5 Units Subcutaneous QHS   insulin aspart  0-9 Units Subcutaneous TID WC   insulin glargine-yfgn  10 Units Subcutaneous Daily   isosorbide mononitrate  30 mg Oral Daily   linaclotide  72 mcg Oral q morning   pantoprazole  40 mg Oral Daily   rosuvastatin  20 mg Oral QHS   sodium chloride flush  10-40 mL Intracatheter Q12H   umeclidinium bromide  1 puff Inhalation Daily    Infusions:  amiodarone 30 mg/hr (02/15/23 0900)   milrinone 0.125 mcg/kg/min (02/15/23 0940)    PRN Medications: acetaminophen, ALPRAZolam, ipratropium-albuterol, melatonin, phenol, sodium chloride flush   Patient Profile   [name] is a 77 y.o. male with CAD c/p CABG 2003, HFrEF, chronic DOE, Rt CEA >30yrs ago, Lt CEA 14', HTN, DM2, HLD, PAD w/  Rt SFA and Rt politeal artery vascular stents 15', prostate cancer. Admitted with a/c HFrEF.  Assessment/Plan  Acute on chronic heart failure with reduced ejection fraction exacerbation -Severely volume overloaded on exam likely due to inadequate outpatient diuresis. -Continue IV Lasix 80 mg twice daily, metolazone 5 mg; holding afterload reduction due to hypotension. -Mixed venous low this morning, dobutamine yesterday discontinued due to atrial flutter.  Will start milrinone 0.125 with repeat mixed venous afterwards.  If blood pressure tolerates increase to 0.25. -CVP 14 this morning  2.  Persistent atrial flutter -Started on amiodarone yesterday.  Now back in normal sinus rhythm.  Will discontinue amiodarone  tomorrow morning due to underlying restrictive lung disease -Continue apixaban 5 mg BID   3.  CAD status post CABG -Holding off on left heart cath due to advanced CKD   4.CAS - S/p b/l CEA - continue statin   5. PAD - s/p intervention 2015 Rt SFA and Rt politeal artery vascular stents - continue statin    6.HTN - therapy as above   7.DM 2 - continue SGLT2i - appreciate TRH recs   8. COPD/emphysema - on 2 L home O2 - once euvolemic start bisoprolol  - appreciate TRH recs  9. AKI on CKD stage 4 -Serum creatinine 2.8 today.  Creatinine and started improving with dobutamine, start milrinone.  Length of Stay: 4  Connor Mathers, DO  02/15/2023, 9:49 AM  Advanced Heart Failure Team Pager [phone] (M-F; 7a - 5p)  Please contact [facility] for night-coverage after hours (5p -7a ) and weekends on [email or web address]  CRITICAL CARE Performed by: Dorthula Nettles   Total critical care time: 35 minutes  Critical care time was exclusive of separately billable procedures and treating other patients.  Critical care was necessary to treat or prevent imminent or life-threatening deterioration.  Critical care was time spent personally by me on the following activities: development of treatment plan with patient and/or surrogate as well as nursing, discussions with consultants, evaluation of patient's response to treatment, examination of patient, obtaining history from patient or surrogate, ordering and performing treatments and interventions, ordering and review of laboratory studies, ordering and review of radiographic studies, pulse oximetry and re-evaluation of patient's condition.

## 2023-02-15 NOTE — Progress Notes (Addendum)
Diet switch from heart healthy to heart healthy/carb modified. May need adjustment to SSI.

## 2023-02-15 NOTE — Progress Notes (Signed)
Progress Note   Patient: [name] ZOX:[id number] DOB: [birth year] DOA: 02/10/2023     4 DOS: the patient was seen and examined on 02/15/2023   Brief hospital course: Mr. Stuard was admitted to the hospital with the working diagnosis of heart failure exacerbation.   77 yo male with the past medical history of heart failure, T2DM, CKD, COPD chronic hypoxemic respiratory failure and hypertension who presented with lower extremity edema. Recent hospitalization 03/26 to 01/24/23 for heart failure exacerbation. He was discharged with 20 mg furosemide. At home he developed worsening lower extremity edema. Cardiology outpatient follow up he had gain 5 lbs from the time of his discharge. At home he continue to have worsening edema, and weight gain for 9 lbs more. On his initial physical examination his blood pressure was 129/82, HR 86, RR 20 and 02 saturation 92%, lungs with bilateral rales, more left than right, positive JVD, heart with S1 and S2 present and rhythmic, abdomen with no distention, positive lower extremity edema pitting.   Na 135, K 4,0 Cl 102 bicarbonate 24 glucose 169, bun 49 cr 2,32  BNP 2,508  Wbc 5,9 hgb 11,8 plt 184   Chest radiograph with cardiomegaly, bilateral hilar vascular congestion, with bilateral interstitial infiltrates, small bilateral pleural effusions, and fluid in the right fissure.  Sternotomy wires in place.   EKG 82 bpm, normal axis, right bundle branch block, sinus rhythm with 1st degree AV block, with no significant ST segment or  T wave changes.   Patient has been placed on furosemide for diuresis.   04/19 volume status is improving but not yet back to baseline.  Patient placed on dobutamine infusion.  04/20 patient developed VT and was transferred to the intensive care unit 2H per cardiology recommendations.   04/21: CVP 14, still expressing dyspnea on exertion and demonstrating signs of fluid overload.  Rate controlled with amiodarone drip.  Following  cardiology/heart failure service recommendation milrinone has been started.  Continue IV diuresis.  Assessment and Plan: * Acute on chronic systolic CHF (congestive heart failure) -Echocardiogram with reduced LV systolic function with EF 35 to 40%, no LVH, RV systolic function preserved, RVSP 45,5 mid dilatation of LA, no pericardial effusion, mild to moderate TR, mild aortic stenosis. -Continue to demonstrate signs of fluid overload on examination; experiencing dyspnea on exertion -Good urine output reported. -Following cardiology service recommendation milrinone has been started. -Continue IV diuresis -Continue to follow daily weights, strict I's and O's and low-sodium diet. -Continue to wean off oxygen supplementation as tolerated. -Metolazone, hydralazine, Farxiga and Imdur will be held at the moment.  Acute kidney injury superimposed on chronic kidney disease -CKD stage 3a at baseline -In the setting of decreased perfusion with heart failure exacerbation, hypotension and active diuresis. -Continue to follow renal function trend -Follow electrolytes and replete as needed -Maintain adequate hydration. -Continue to avoid nephrotoxic agents as much as possible; avoid the use of contrast and hypotension.  Atrial flutter -Now patient on amiodarone drip for VT -Continue Eliquis for secondary prevention -Continue to follow electrolytes and replete as needed; goal is for potassium above 4 and magnesium above 2. -Continue to follow cardiology service recommendations.   GERD (gastroesophageal reflux disease) -Continue protonix 40 mg daily.  Type 2 diabetes mellitus with hyperlipidemia -Uncontrolled T2DM with hyperglycemia.  -Continue sliding scale insulin and adjusted dose of basal long-acting regimen. -Follow CBGs fluctuation and further adjust hypoglycemic regimen as needed. -Continue statin therapy.   COPD (chronic obstructive pulmonary disease) -No clinical signs  of  exacerbation. -Continue current medical management.   DNR (do not resuscitate) -Verified pt is a DNR. Pt is emphatic that he is a DNR/DNI. -Daughter at bedside also confirming code Status.   Subjective: Afebrile, no chest pain, no nausea or vomiting.  Reports feeling tired and easily fatigue.  Good urine output in the last 24 hours appreciated (1.6-1.8 L).  Patient's CVP 14.  Physical Exam: Vitals:   02/15/23 0800 02/15/23 0822 02/15/23 0900 02/15/23 1000  BP: 110/62  (!) 111/54 (!) 103/56  Pulse:   (!) 59 (!) 58  Resp: 18  (!) 21 15  Temp: 97.9 F (36.6 C)     TempSrc: Oral     SpO2: 96% 96% 92% 96%  Weight:      Height:       General exam: Alert, awake, oriented x 3; reported no chest pain, no short winded with minimal activity and demonstrating signs of fluid overload on exam. Respiratory system: Decreased breath sounds at the bases; no using accessory muscle. No rales. Cardiovascular system: Rate controlled, no rubs, no gallops, no JVD.  Gastrointestinal system: Abdomen is nondistended, soft and no guarding. No organomegaly or masses felt. Normal bowel sounds heard.  Patient expressed soreness from an insulin injection therapy in his abdomen. Central nervous system: Alert and oriented. No focal neurological deficits. Extremities: No cyanosis or clubbing.  Positive edema appreciated bilaterally. Skin: No petechiae. Psychiatry: Judgement and insight appear normal. Mood & affect appropriate.   Data Reviewed: CBC: WBC 5.3, hemoglobin 11.8 and platelet count 170 K Oximetry panel total hemoglobin 12.0 O2 saturation 52.3, carboxyhemoglobin 1.4  and methemoglobin < 0.7 Basic metabolic panel: Sodium 130, potassium 3.6, chloride 88, bicarb 27, BUN 60, creatinine 2.80 and GFR 23.  Anion gap 15 Magnesium:2.1    Family Communication: I spoke with patient's daughter at the bedside (02/15/23)   Disposition: Status is: Inpatient Remains inpatient appropriate because: heart failure     Planned Discharge Destination: Home  Author: Vassie Loll, MD 02/15/2023 10:37 AM  For on call review [email or web address].

## 2023-02-16 ENCOUNTER — Inpatient Hospital Stay (HOSPITAL_COMMUNITY): Payer: Medicare HMO

## 2023-02-16 DIAGNOSIS — N179 Acute kidney failure, unspecified: Secondary | ICD-10-CM | POA: Diagnosis not present

## 2023-02-16 DIAGNOSIS — I4892 Unspecified atrial flutter: Secondary | ICD-10-CM | POA: Diagnosis not present

## 2023-02-16 DIAGNOSIS — K219 Gastro-esophageal reflux disease without esophagitis: Secondary | ICD-10-CM | POA: Diagnosis not present

## 2023-02-16 DIAGNOSIS — I5023 Acute on chronic systolic (congestive) heart failure: Secondary | ICD-10-CM | POA: Diagnosis not present

## 2023-02-16 LAB — MAGNESIUM: Magnesium: 2.1 mg/dL (ref 1.7–2.4)

## 2023-02-16 LAB — BASIC METABOLIC PANEL
Anion gap: 16 — ABNORMAL HIGH (ref 5–15)
BUN: 66 mg/dL — ABNORMAL HIGH (ref 8–23)
CO2: 28 mmol/L (ref 22–32)
Calcium: 9 mg/dL (ref 8.9–10.3)
Chloride: 86 mmol/L — ABNORMAL LOW (ref 98–111)
Creatinine, Ser: 3.08 mg/dL — ABNORMAL HIGH (ref 0.61–1.24)
GFR, Estimated: 20 mL/min — ABNORMAL LOW (ref >60)
Glucose, Bld: 226 mg/dL — ABNORMAL HIGH (ref 70–99)
Potassium: 3.9 mmol/L (ref 3.5–5.1)
Sodium: 130 mmol/L — ABNORMAL LOW (ref 135–145)

## 2023-02-16 LAB — GLUCOSE, CAPILLARY
Glucose-Capillary: 221 mg/dL — ABNORMAL HIGH (ref 70–99)
Glucose-Capillary: 318 mg/dL — ABNORMAL HIGH (ref 70–99)
Glucose-Capillary: 219 mg/dL — ABNORMAL HIGH (ref 70–99)
Glucose-Capillary: 234 mg/dL — ABNORMAL HIGH (ref 70–99)

## 2023-02-16 LAB — ECHOCARDIOGRAM COMPLETE
AR max vel: 2.16 cm2
AV Area VTI: 2.18 cm2
AV Area mean vel: 2.26 cm2
AV Mean grad: 7 mmHg
AV Peak grad: 13 mmHg
AV Vena cont: 0.4 cm
Ao pk vel: 1.81 m/s
Area-P 1/2: 3.37 cm2
Calc EF: 35.4 %
Est EF: 30
Height: 70 in
MV VTI: 2.52 cm2
P 1/2 time: 243 msec
S' Lateral: 4.5 cm
Single Plane A2C EF: 36.3 %
Single Plane A4C EF: 36.2 %
Weight: 2874.8 oz

## 2023-02-16 LAB — COOXEMETRY PANEL
Carboxyhemoglobin: 1.2 % (ref 0.5–1.5)
Carboxyhemoglobin: 1.5 % (ref 0.5–1.5)
Carboxyhemoglobin: 1.5 % (ref 0.5–1.5)
Methemoglobin: <0.7 % (ref 0.0–1.5)
Methemoglobin: <0.7 % (ref 0.0–1.5)
Methemoglobin: <0.7 % (ref 0.0–1.5)
O2 Saturation: 55.4 %
O2 Saturation: 38.9 %
O2 Saturation: 63.4 %
Total hemoglobin: 11.7 g/dL — ABNORMAL LOW (ref 12.0–16.0)
Total hemoglobin: 11.1 g/dL — ABNORMAL LOW (ref 12.0–16.0)
Total hemoglobin: 10.8 g/dL — ABNORMAL LOW (ref 12.0–16.0)

## 2023-02-16 MED ORDER — POTASSIUM CHLORIDE CRYS ER 20 MEQ PO TBCR
40.0000 meq | EXTENDED_RELEASE_TABLET | Freq: Once | ORAL | Status: AC
  Administered 2023-02-16: 40 meq via ORAL
  Filled 2023-02-16: qty 2

## 2023-02-16 MED ORDER — METOLAZONE 5 MG PO TABS: 5.0000 mg | ORAL_TABLET | Freq: Once | ORAL | Status: DC

## 2023-02-16 MED ORDER — AMIODARONE HCL 200 MG PO TABS: 200.0000 mg | ORAL_TABLET | Freq: Two times a day (BID) | ORAL | Status: DC

## 2023-02-16 MED ORDER — AMIODARONE HCL IN DEXTROSE 360-4.14 MG/200ML-% IV SOLN
30.0000 mg/h | INTRAVENOUS | Status: DC
  Administered 2023-02-16 – 2023-02-21 (×10): 30 mg/h via INTRAVENOUS
  Filled 2023-02-16 (×9): qty 200

## 2023-02-16 NOTE — Progress Notes (Addendum)
Progress Note   Patient: [name] MWU:[id number] DOB: January 20, 1946 DOA: 02/10/2023     5 DOS: the patient was seen and examined on 02/16/2023   Brief hospital course: [name] was admitted to the hospital with the working diagnosis of heart failure exacerbation.   77 yo male with the past medical history of heart failure, T2DM, CKD, COPD chronic hypoxemic respiratory failure and hypertension who presented with lower extremity edema. Recent hospitalization 03/26 to 01/24/23 for heart failure exacerbation. He was discharged with 20 mg furosemide. At home he developed worsening lower extremity edema. Cardiology outpatient follow up he had gain 5 lbs from the time of his discharge. At home he continue to have worsening edema, and weight gain for 9 lbs more. On his initial physical examination his blood pressure was 129/82, HR 86, RR 20 and 02 saturation 92%, lungs with bilateral rales, more left than right, positive JVD, heart with S1 and S2 present and rhythmic, abdomen with no distention, positive lower extremity edema pitting.   Na 135, K 4,0 Cl 102 bicarbonate 24 glucose 169, bun 49 cr 2,32  BNP 2,508  Wbc 5,9 hgb 11,8 plt 184   Chest radiograph with cardiomegaly, bilateral hilar vascular congestion, with bilateral interstitial infiltrates, small bilateral pleural effusions, and fluid in the right fissure.  Sternotomy wires in place.   EKG 82 bpm, normal axis, right bundle branch block, sinus rhythm with 1st degree AV block, with no significant ST segment or  T wave changes.   Patient has been placed on furosemide for diuresis.   04/19 volume status is improving but not yet back to baseline.  Patient placed on dobutamine infusion.  04/20 patient developed VT and was transferred to the intensive care unit 2H per cardiology recommendations.   04/21: CVP 14, still expressing dyspnea on exertion and demonstrating signs of fluid overload.  Rate controlled with amiodarone drip.  Following  cardiology/heart failure service recommendation milrinone has been started.  Continue IV diuresis. 04/22 patient on milrinone infusion.   Assessment and Plan: * Acute on chronic systolic CHF (congestive heart failure) -Echocardiogram with reduced LV systolic function with EF 35 to 40%, no LVH, RV systolic function preserved, RVSP 45,5 mid dilatation of LA, no pericardial effusion, mild to moderate TR, mild aortic stenosis.  Low output heart failure.   Urine output is 2,350 ml Systolic blood pressure is 129 to 133 mmHg.  SV02 55,4   Continue diuresis with furosemide 80 mg IV q12 hrs Milrinone for inotropic support.  IV amiodarone for non sustained ventricular tachycardia.   Acute kidney injury superimposed on chronic kidney disease -CKD stage 3a at baseline  Renal function with serum cr at 3,0 with K at 3,9 and serum bicarbonate at 28. Na is 130 and Mg 2.1   Plan to continue diuresis with furosemide 80 mg IV q12. Follow up renal function in am.   Atrial flutter Rate control with amiodarone.  Ectopy has improved, with amiodarone. Anticoagulation with apixaban Continue telemetry monitoring.    GERD (gastroesophageal reflux disease) -Continue protonix 40 mg daily.  Type 2 diabetes mellitus with hyperlipidemia -Uncontrolled T2DM with hyperglycemia.   Continue sliding scale insulin and adjusted dose of basal long-acting regimen. Fasting glucose this am 266 mg/dl.   Continue with statin therapy.   COPD (chronic obstructive pulmonary disease) -No clinical signs of exacerbation. -Continue current medical management.   DNR (do not resuscitate) -Verified pt is a DNR. Pt is emphatic that he is a DNR/DNI. -Daughter at bedside  also confirming code Status.        Subjective: Patient with no chest pain, his dyspnea has improved and he is feeling better today.   Physical Exam: Vitals:   02/16/23 0420 02/16/23 0500 02/16/23 0530 02/16/23 0759  BP: (!) 121/51 (!) 130/57  (!) 117/52   Pulse: 70 70 65   Resp: Temp: 99.2 F (37.3 C)     TempSrc: Oral     SpO2: 94% 96% 95% 93%  Weight: 81.5 kg     Height:       Neurology awake and alert ENT with mild pallor Cardiovascular with S1 and S2 present, irregularly irregular with no gallops, rubs or murmurs Mild to moderate JVD Lower extremity with + to ++ edema, unna boots in place Respiratory with no wheezing no rhonchi, scattered rales\ Abdomen with no distention  Data Reviewed:    Family Communication: no family at the bedside   Disposition: Status is: Inpatient Remains inpatient appropriate because: IV amiodarone, IV milrinone and IV furosemide   Planned Discharge Destination: Home     Author: Coralie Keens, MD 02/16/2023 11:04 AM  For on call review [email or web address].

## 2023-02-16 NOTE — Inpatient Diabetes Management (Signed)
Inpatient Diabetes Program Recommendations  AACE/ADA: New Consensus Statement on Inpatient Glycemic Control (2015)  Target Ranges:  Prepandial:   less than 140 mg/dL      Peak postprandial:   less than 180 mg/dL (1-2 hours)      Critically ill patients:  140 - 180 mg/dL   Lab Results  Component Value Date   GLUCAP 221 (H) 02/16/2023   HGBA1C 7.0 (H) 09/20/2022    Review of Glycemic Control  Latest Reference Range & Units 02/15/23 07:00 02/15/23 09:22 02/15/23 11:48 02/15/23 16:51 02/15/23 20:59 02/16/23 06:39  Glucose-Capillary 70 - 99 mg/dL 161 (H) 096 (H) 045 (H) 306 (H) 203 (H) 221 (H)  (H): Data is abnormally high Diabetes history: DM2 Outpatient Diabetes medications: Novolin N 40 units am, 35 units hs, Novolin R 6-15 units tid meal coverage Current orders for Inpatient glycemic control: Semglee 10 units QD, Novolog 0-9 units tid, 0-5 units hs correction   Inpatient Diabetes Program Recommendations:   Consider: -Add Novolog 3 units tid meal coverage if eats 50% - Increasing Semglee to 16 units QD   Thanks, Lujean Rave, MSN, RNC-OB Diabetes Coordinator [phone] (8a-5p)

## 2023-02-16 NOTE — Evaluation (Signed)
Physical Therapy Evaluation/ Discharge Patient Details Name: [name] MRN: [id number] DOB: [birth year] Today's Date: 02/16/2023  History of Present Illness  77 yo male admitted 4/16 with SOB, edema and CHF exacerbation. 4/20 Aflutter with RVR and hypotension with tranfer to ICU. PMHx: CHF, T2DM, CKD, HTN, resp fail on home O2, COPD, HLD  Clinical Impression  Pt very pleasant, lives with wife and enjoys jigsaw puzzles. Pt required 3L during gait this session to maintain SPO2 >91% and reports he checks pulse ox at home daily and rarely uses O2. Pt with cane and RW at home as needed, reports no falls and able to walk long hall distance. Pt does not currently require acute therapy intervention and encouraged daily ambulation with nursing staff.  Will sign off with pt aware and agreeable.       Recommendations for follow up therapy are one component of a multi-disciplinary discharge planning process, led by the attending physician.  Recommendations may be updated based on patient status, additional functional criteria and insurance authorization.  Follow Up Recommendations       Assistance Recommended at Discharge None  Patient can return home with the following       Equipment Recommendations None recommended by PT  Recommendations for Other Services       Functional Status Assessment Patient has not had a recent decline in their functional status     Precautions / Restrictions Precautions Precautions: Fall;Other (comment) Precaution Comments: watch sats      Mobility  Bed Mobility Overal bed mobility: Modified Independent                  Transfers Overall transfer level: Modified independent                      Ambulation/Gait Ambulation/Gait assistance: Modified independent (Device/Increase time) Gait Distance (Feet): 400 Feet Assistive device: None Gait Pattern/deviations: Step-through pattern, Decreased stride length   Gait velocity  interpretation: 1.31 - 2.62 ft/sec, indicative of limited community ambulator   General Gait Details: 2 standing rest breaks with slight sway with gait without need for physical assist, cues for direction to room  Stairs            Wheelchair Mobility    Modified Rankin (Stroke Patients Only)       Balance Overall balance assessment: Mild deficits observed, not formally tested                                           Pertinent Vitals/Pain Pain Assessment Pain Assessment: No/denies pain    Home Living Family/patient expects to be discharged to:: Private residence Living Arrangements: Spouse/significant other Available Help at Discharge: Family;Available 24 hours/day Type of Home: House Home Access: Level entry       Home Layout: One level Home Equipment: Cane - single point      Prior Function Prior Level of Function : Independent/Modified Independent                     Hand Dominance        Extremity/Trunk Assessment   Upper Extremity Assessment Upper Extremity Assessment: Generalized weakness    Lower Extremity Assessment Lower Extremity Assessment: Generalized weakness    Cervical / Trunk Assessment Cervical / Trunk Assessment: Normal  Communication   Communication: No difficulties  Cognition Arousal/Alertness: Awake/alert Behavior During Therapy:  WFL for tasks assessed/performed Overall Cognitive Status: Within Functional Limits for tasks assessed                                          General Comments      Exercises     Assessment/Plan    PT Assessment Patient does not need any further PT services  PT Problem List         PT Treatment Interventions      PT Goals (Current goals can be found in the Care Plan section)  Acute Rehab PT Goals PT Goal Formulation: All assessment and education complete, DC therapy    Frequency       Co-evaluation               AM-PAC PT "6  Clicks" Mobility  Outcome Measure Help needed turning from your back to your side while in a flat bed without using bedrails?: None Help needed moving from lying on your back to sitting on the side of a flat bed without using bedrails?: None Help needed moving to and from a bed to a chair (including a wheelchair)?: None Help needed standing up from a chair using your arms (e.g., wheelchair or bedside chair)?: None Help needed to walk in hospital room?: A Little Help needed climbing 3-5 steps with a railing? : A Little 6 Click Score: 22    End of Session Equipment Utilized During Treatment: Oxygen Activity Tolerance: Patient tolerated treatment well Patient left: in chair;with call bell/phone within reach;with chair alarm set Nurse Communication: Mobility status PT Visit Diagnosis: Other abnormalities of gait and mobility (R26.89)    Time: 1610-9604 PT Time Calculation (min) (ACUTE ONLY): 23 min   Charges:   PT Evaluation $PT Eval Moderate Complexity: 1 Mod          Sarahgrace Broman P, PT Acute Rehabilitation Services Office: [phone]   Enedina Finner Elandra Powell 02/16/2023, 12:19 PM

## 2023-02-16 NOTE — Progress Notes (Signed)
  Milrinone increased 0.25 mcg. CO-OX now 63%.    Continue current regimen.   Khayree Delellis NP-C  2:28 PM

## 2023-02-16 NOTE — Progress Notes (Addendum)
Advanced Heart Failure Rounding Note  PCP-Cardiologist: Kristeen Miss, MD   Subjective:   -4/20 while on dobutamine patient went into atrial flutter with RVR, became hypotensive.  Decision made to start amiodarone and transferred to CVICU.  On amio 30 mg per hour.   - CO-OX 55%. On milrinone 0.125 mcg.   Creatinine 2.8>3.08.   Wants to get OOB. Denies SOB.   Objective:   Weight Range: 81.5 kg Body mass index is 25.78 kg/m.   Vital Signs:   Temp:  [97.7 F (36.5 C)-99.2 F (37.3 C)] 99.2 F (37.3 C) (04/22 0420) Pulse Rate:  [55-83] 65 (04/22 0530) Resp:  [12-32] 16 (04/22 0530) BP: (100-130)/(46-68) 117/52 (04/22 0530) SpO2:  [90 %-98 %] 93 % (04/22 0759) Weight:  [81.5 kg] 81.5 kg (04/22 0420) Last BM Date : 02/15/23  Weight change: Filed Weights   02/13/23 0555 02/14/23 0448 02/16/23 0420  Weight: 86.5 kg 82.5 kg 81.5 kg    Intake/Output:   Intake/Output Summary (Last 24 hours) at 02/16/2023 0814 Last data filed at 02/16/2023 0600 Gross per 24 hour  Intake 1156.61 ml  Output 2100 ml  Net -943.39 ml   CVP 9-10  Physical Exam  General:  Elderly. No resp difficulty.  HEENT: normal Neck: supple. JVP 9-10 . Carotids 2+ bilat; no bruits. No lymphadenopathy or thryomegaly appreciated. Cor: PMI nondisplaced. Regular rate & rhythm. No rubs, gallops or murmurs. Lungs: clear on 5 liters Amoret Abdomen: soft, nontender, nondistended. No hepatosplenomegaly. No bruits or masses. Good bowel sounds. Extremities: no cyanosis, clubbing, rash, R and LLE 1+ edema. RUE PICC Neuro: alert & orientedx3, cranial nerves grossly intact. moves all 4 extremities w/o difficulty. Affect pleasant  Telemetry  SR 60s   EKG    No new EKG to review  Labs    CBC Recent Labs    02/14/23 0912 02/15/23 0445  WBC 4.1 5.3  HGB 11.6* 11.8*  HCT 36.4* 36.3*  MCV 85.8 84.6  PLT 153 170   Basic Metabolic Panel Recent Labs    21/30/86 0445 02/16/23 0413  NA 130* 130*  K 3.6 3.9   CL 88* 86*  CO2 27 28  GLUCOSE 261* 226*  BUN 60* 66*  CREATININE 2.80* 3.08*  CALCIUM 8.6* 9.0  MG 2.1 2.1   BNP (last 3 results) Recent Labs    01/20/23 1357 02/02/23 1130 02/10/23 1302  BNP 1,939.2* 2,275.8* 2,508.7*      Imaging    No results found.   Medications:     Scheduled Medications:  apixaban  5 mg Oral BID   Chlorhexidine Gluconate Cloth  6 each Topical Daily   fluticasone furoate-vilanterol  1 puff Inhalation Daily   furosemide  80 mg Intravenous BID   insulin aspart  0-5 Units Subcutaneous QHS   insulin aspart  0-9 Units Subcutaneous TID WC   insulin glargine-yfgn  13 Units Subcutaneous Daily   linaclotide  72 mcg Oral q morning   pantoprazole  40 mg Oral Daily   rosuvastatin  20 mg Oral QHS   sodium chloride flush  10-40 mL Intracatheter Q12H   umeclidinium bromide  1 puff Inhalation Daily    Infusions:  amiodarone 30 mg/hr (02/16/23 0727)   milrinone 0.125 mcg/kg/min (02/16/23 0600)    PRN Medications: acetaminophen, ALPRAZolam, ipratropium-albuterol, phenol, sodium chloride flush, traZODone   Patient Profile   [name] is a 77 y.o. male with CAD c/p CABG 2003, HFrEF, chronic DOE, Rt CEA >97yrs ago, Lt CEA  14', HTN, DM2, HLD, PAD w/ Rt SFA and Rt politeal artery vascular stents 15', prostate cancer. Admitted with a/c HFrEF.  Assessment/Plan  Acute on chronic heart failure with reduced ejection fraction exacerbation - Echo in 3/24 with EF 35-40%, RV normal.  -Severely volume overloaded on exam likely due to inadequate outpatient  Yesterday CO-OX low, switched from DBA to milrinone.  - Remains on milrinone 0.125 mcg. CO-OX 55%. Increase milrinone 0.25 mcg.  - CVP 9-10. Continue IV lasix. No metolazone today  - GDMT limited by hypotension/AKI.  - RHC later this week. Repeat ECHO today.   2.  Persistent atrial flutter - Now back in normal sinus rhythm.  Will continue IV amio while on milrinone.  - Convert to PO laer -Continue  apixaban 5 mg BID   3.  CAD status post CABG -Holding off on left heart cath due to advanced CKD   4.Carotid Disease - S/p b/l CEA - continue statin   5. PAD - s/p intervention 2015 Rt SFA and Rt politeal artery vascular stents - continue statin    6.HTN - therapy as above   7.DM 2 - continue SGLT2i - appreciate TRH recs   8. COPD/emphysema - Uses oxygen as needed---> wean as tolerated - once euvolemic start bisoprolol   9. AKI on CKD stage 4 Baseline creatinine ~ 2.2  -Creatinine trending up 2.8>3.   Length of Stay: 5  [name] Clegg, NP  02/16/2023, 8:14 AM  Advanced Heart Failure Team Pager [phone] (M-F; 7a - 5p)  Please contact [facility] for night-coverage after hours (5p -7a ) and weekends on [email or web address]  Patient seen with NP, agree with the above note.   Good diuresis yesterday, weight down 2 lbs.   However, creatinine up 2.8 => 3.08.  Co-ox 55% on milrinone 0.125.  He is in NSR today on amiodarone 30 mg/hr.    CVP 13 on my read.   No complaints.   General: NAD Neck: JVP 12-14 cm, no thyromegaly or thyroid nodule.  Lungs: Clear to auscultation bilaterally with normal respiratory effort. CV: Nondisplaced PMI.  Heart regular S1/S2, no S3/S4, no murmur.  1+ edema to knees.  Abdomen: Soft, nontender, no hepatosplenomegaly, no distention.  Skin: Intact without lesions or rashes.  Neurologic: Alert and oriented x 3.  Psych: Normal affect. Extremities: No clubbing or cyanosis.  HEENT: Normal.   He remains in NSR, continue amiodarone gtt while on milrinone. Apixaban for anticoagulation.   With marginal co-ox and rise in creatinine, will increase milrinone to 0.25 today.  Still volume overloaded, however, with CVP 13.  Will continue Lasix 80 mg IV bid today but will not give metolazone. Will reassess creatinine/volume status tomorrow, will likely need formal RHC some time this week.   CRITICAL CARE Performed by: Marca Ancona  Total critical care time: 35  minutes  Critical care time was exclusive of separately billable procedures and treating other patients.  Critical care was necessary to treat or prevent imminent or life-threatening deterioration.  Critical care was time spent personally by me on the following activities: development of treatment plan with patient and/or surrogate as well as nursing, discussions with consultants, evaluation of patient's response to treatment, examination of patient, obtaining history from patient or surrogate, ordering and performing treatments and interventions, ordering and review of laboratory studies, ordering and review of radiographic studies, pulse oximetry and re-evaluation of patient's condition.  Marca Ancona 02/16/2023 8:42 AM

## 2023-02-16 NOTE — Progress Notes (Signed)
Orthopedic Tech Progress Note Patient Details:  [name] November 23, 1945 [id number]  Called floor around 1323 to have RN remove UNNA BOOTS so icould come and apply a new pair and its 1537 and patient still has on UNNA BOOTS. Will try to come and apply later   Patient ID: [name], male   DOB: 1946/06/30, 77 y.o.   MRN: [id number]  [name] 02/16/2023, 3:36 PM

## 2023-02-17 DIAGNOSIS — I5023 Acute on chronic systolic (congestive) heart failure: Secondary | ICD-10-CM | POA: Diagnosis not present

## 2023-02-17 DIAGNOSIS — I4892 Unspecified atrial flutter: Secondary | ICD-10-CM | POA: Diagnosis not present

## 2023-02-17 DIAGNOSIS — E1169 Type 2 diabetes mellitus with other specified complication: Secondary | ICD-10-CM | POA: Diagnosis not present

## 2023-02-17 DIAGNOSIS — N179 Acute kidney failure, unspecified: Secondary | ICD-10-CM | POA: Diagnosis not present

## 2023-02-17 LAB — BASIC METABOLIC PANEL
Anion gap: 14 (ref 5–15)
BUN: 64 mg/dL — ABNORMAL HIGH (ref 8–23)
CO2: 26 mmol/L (ref 22–32)
Calcium: 8.3 mg/dL — ABNORMAL LOW (ref 8.9–10.3)
Chloride: 87 mmol/L — ABNORMAL LOW (ref 98–111)
Creatinine, Ser: 2.75 mg/dL — ABNORMAL HIGH (ref 0.61–1.24)
GFR, Estimated: 23 mL/min — ABNORMAL LOW (ref >60)
Glucose, Bld: 268 mg/dL — ABNORMAL HIGH (ref 70–99)
Potassium: 3.3 mmol/L — ABNORMAL LOW (ref 3.5–5.1)
Sodium: 127 mmol/L — ABNORMAL LOW (ref 135–145)

## 2023-02-17 LAB — COOXEMETRY PANEL
Carboxyhemoglobin: 1.9 % — ABNORMAL HIGH (ref 0.5–1.5)
Methemoglobin: <0.7 % (ref 0.0–1.5)
O2 Saturation: 75.8 %
Total hemoglobin: 10.8 g/dL — ABNORMAL LOW (ref 12.0–16.0)

## 2023-02-17 LAB — GLUCOSE, CAPILLARY
Glucose-Capillary: 191 mg/dL — ABNORMAL HIGH (ref 70–99)
Glucose-Capillary: 243 mg/dL — ABNORMAL HIGH (ref 70–99)
Glucose-Capillary: 211 mg/dL — ABNORMAL HIGH (ref 70–99)
Glucose-Capillary: 169 mg/dL — ABNORMAL HIGH (ref 70–99)
Glucose-Capillary: 209 mg/dL — ABNORMAL HIGH (ref 70–99)
Glucose-Capillary: 232 mg/dL — ABNORMAL HIGH (ref 70–99)

## 2023-02-17 LAB — MAGNESIUM: Magnesium: 2.2 mg/dL (ref 1.7–2.4)

## 2023-02-17 MED ORDER — ORAL CARE MOUTH RINSE: 15.0000 mL | OROMUCOSAL | Status: DC | PRN

## 2023-02-17 MED ORDER — SODIUM CHLORIDE 0.9% FLUSH: 3.0000 mL | INTRAVENOUS | Status: DC | PRN

## 2023-02-17 MED ORDER — SODIUM CHLORIDE 0.9% FLUSH
3.0000 mL | Freq: Two times a day (BID) | INTRAVENOUS | Status: DC
  Administered 2023-02-18 – 2023-03-04 (×25): 3 mL via INTRAVENOUS

## 2023-02-17 MED ORDER — POTASSIUM CHLORIDE CRYS ER 20 MEQ PO TBCR
40.0000 meq | EXTENDED_RELEASE_TABLET | ORAL | Status: AC
  Administered 2023-02-17 (×2): 40 meq via ORAL
  Filled 2023-02-17 (×2): qty 2

## 2023-02-17 MED ORDER — SODIUM CHLORIDE 0.9 % IV SOLN: 250.0000 mL | INTRAVENOUS | Status: DC | PRN

## 2023-02-17 MED ORDER — SODIUM CHLORIDE 0.9 % IV SOLN: INTRAVENOUS | Status: DC

## 2023-02-17 MED ORDER — INSULIN GLARGINE-YFGN 100 UNIT/ML ~~LOC~~ SOLN
20.0000 [IU] | Freq: Every day | SUBCUTANEOUS | Status: DC
  Administered 2023-02-17: 20 [IU] via SUBCUTANEOUS
  Filled 2023-02-17 (×2): qty 0.2

## 2023-02-17 MED ORDER — ASPIRIN 81 MG PO CHEW
81.0000 mg | CHEWABLE_TABLET | ORAL | Status: AC
  Administered 2023-02-18: 81 mg via ORAL
  Filled 2023-02-17: qty 1

## 2023-02-17 NOTE — Progress Notes (Incomplete)
PROGRESS NOTE    [name]  XBM:[id number] DOB: 26-Jan-1946 DOA: 02/10/2023 PCP: Gaspar Garbe, MD  77/M w chronic systolic CHF, T2DM, CKD, COPD chronic hypoxemic respiratory failure and hypertension who presented with lower extremity edema. Recent hospitalization 3/26-3/30 for CHF. Cardiology outpatient follow up he had gain 5 lbs from the time of his discharge. At home he continue to have worsening edema, and weight gain for 9 lbs more. In the ED, + edema and rales, labs w/ cr 2,32, BNP 2,508, CXR noted cardiomegaly, bilateral hilar vascular congestion, with bilateral interstitial infiltrates, small bilateral pleural effusions, -Admitted, started on diuretics -4/19 -started on dobutamine infusion.  -4/20 developed VT TX to ICU -4/21: CVP 14, diuresing, continued amio,  Started milrinone -4/23 improvement in volume status  Plan for right heart catheterization.    Subjective:   Assessment and Plan:  Acute on chronic systolic CHF (congestive heart failure) -Echo with EF 35 to 40%, no LVH, RV systolic function preserved,  -Low output heart failure.  -CHF team following, off Dobutamine, now on milrinone -IV amiodarone for non sustained ventricular tachycardia.  Plan for right heart catheterization.   AKI on CKD3b -baseline creat around 2.2 -now 2.7, monitor  Persistent Atrial flutter Rate control with amiodarone.  Ectopy has improved, with amiodarone. -continue apixaban  CAD CABG -ischemic workup deferred w/ CKD   PAD - s/p intervention 2015 Rt SFA and Rt politeal artery vascular stents, continue statin   GERD (gastroesophageal reflux disease) -Continue protonix 40 mg daily.  Type 2 diabetes mellitus with hyperlipidemia -Uncontrolled T2DM with hyperglycemia.  -continue Galrgine  COPD (chronic obstructive pulmonary disease) -No clinical signs of exacerbation. -Continue current medical management.   DNR (do not resuscitate)  DVT prophylaxis: apixaban Code  Status: DNR Family Communication: Disposition Plan:   Consultants: CHF team   Procedures:   Antimicrobials:    Objective: Vitals:   02/17/23 1100 02/17/23 1149 02/17/23 1200 02/17/23 1300  BP: (!) 141/61  (!) 127/55 (!) 131/47  Pulse: 73  73 73  Resp: 20  (!) 21 18  Temp:  98.9 F (37.2 C)    TempSrc:  Oral    SpO2: 94%  95% 96%  Weight:      Height:        Intake/Output Summary (Last 24 hours) at 02/17/2023 1339 Last data filed at 02/17/2023 1300 Gross per 24 hour  Intake 788.5 ml  Output 2200 ml  Net -1411.5 ml   Filed Weights   02/14/23 0448 02/16/23 0420 02/17/23 0817  Weight: 82.5 kg 81.5 kg 81 kg    Examination:  General exam: Appears calm and comfortable  Respiratory system: Clear to auscultation Cardiovascular system: S1 & S2 heard, RRR.  Abd: nondistended, soft and nontender.Normal bowel sounds heard. Central nervous system: Alert and oriented. No focal neurological deficits. Extremities: no edema Skin: No rashes Psychiatry:  Mood & affect appropriate.     Data Reviewed:   CBC: Recent Labs  Lab 02/11/23 0106 02/12/23 0035 02/13/23 0059 02/14/23 0912 02/15/23 0445  WBC 5.2 5.5 4.8 4.1 5.3  HGB 11.7* 12.5* 12.1* 11.6* 11.8*  HCT 37.4* 39.8 40.5 36.4* 36.3*  MCV 88.0 87.9 90.6 85.8 84.6  PLT 173 181 149* 153 170   Basic Metabolic Panel: Recent Labs  Lab 02/14/23 0912 02/14/23 1205 02/15/23 0445 02/16/23 0413 02/17/23 0358  NA 130* 132* 130* 130* 127*  K 3.6 3.4* 3.6 3.9 3.3*  CL 89* 87* 88* 86* 87*  CO2 28  26  GLUCOSE 361* 336* 261* 226* 268*  BUN 52* 54* 60* 66* 64*  CREATININE 2.66* 2.67* 2.80* 3.08* 2.75*  CALCIUM 9.1 8.8* 8.6* 9.0 8.3*  MG 2.2 2.0 2.1 2.1 2.2   GFR: Estimated Creatinine Clearance: 24 mL/min (A) (by C-G formula based on SCr of 2.75 mg/dL (H)). Liver Function Tests: Recent Labs  Lab 02/10/23 2007 02/11/23 0106  AST 30 29  ALT 26 25  ALKPHOS 86 85  BILITOT 0.7 0.5  PROT 6.8 6.3*  ALBUMIN  3.4* 3.0*   No results for input(s): "LIPASE", "AMYLASE" in the last 168 hours. No results for input(s): "AMMONIA" in the last 168 hours. Coagulation Profile: No results for input(s): "INR", "PROTIME" in the last 168 hours. Cardiac Enzymes: No results for input(s): "CKTOTAL", "CKMB", "CKMBINDEX", "TROPONINI" in the last 168 hours. BNP (last 3 results) No results for input(s): "PROBNP" in the last 8760 hours. HbA1C: No results for input(s): "HGBA1C" in the last 72 hours. CBG: Recent Labs  Lab 02/16/23 2134 02/17/23 0634 02/17/23 0813 02/17/23 1038 02/17/23 1146  GLUCAP 234* 191* 243* 211* 169*   Lipid Profile: No results for input(s): "CHOL", "HDL", "LDLCALC", "TRIG", "CHOLHDL", "LDLDIRECT" in the last 72 hours. Thyroid Function Tests: No results for input(s): "TSH", "T4TOTAL", "FREET4", "T3FREE", "THYROIDAB" in the last 72 hours. Anemia Panel: No results for input(s): "VITAMINB12", "FOLATE", "FERRITIN", "TIBC", "IRON", "RETICCTPCT" in the last 72 hours. Urine analysis:    Component Value Date/Time   COLORURINE STRAW (A) 10/19/2022 0628   APPEARANCEUR CLEAR 10/19/2022 0628   LABSPEC 1.009 10/19/2022 0628   PHURINE 5.0 10/19/2022 0628   GLUCOSEU >=500 (A) 10/19/2022 0628   HGBUR NEGATIVE 10/19/2022 0628   BILIRUBINUR NEGATIVE 10/19/2022 0628   KETONESUR NEGATIVE 10/19/2022 0628   PROTEINUR NEGATIVE 10/19/2022 0628   UROBILINOGEN 1.0 07/19/2013 1223   NITRITE NEGATIVE 10/19/2022 0628   LEUKOCYTESUR NEGATIVE 10/19/2022 0628   Sepsis Labs: @LABRCNTIP (procalcitonin:4,lacticidven:4)  ) Recent Results (from the past 240 hour(s))  MRSA Next Gen by PCR, Nasal     Status: None   Collection Time: 02/14/23  2:46 PM   Specimen: Nasal Mucosa; Nasal Swab  Result Value Ref Range Status   MRSA by PCR Next Gen NOT DETECTED NOT DETECTED Final    Comment: (NOTE) The GeneXpert MRSA Assay (FDA approved for NASAL specimens only), is one component of a comprehensive MRSA colonization  surveillance program. It is not intended to diagnose MRSA infection nor to guide or monitor treatment for MRSA infections. Test performance is not FDA approved in patients less than 43 years old. Performed at [facility], [address] [place], [place] [address]      Radiology Studies: ECHOCARDIOGRAM COMPLETE  Result Date: 02/16/2023    ECHOCARDIOGRAM REPORT   Patient Name:   TRAVEION RUDDOCK Date of Exam: 02/16/2023 Medical Rec #:  [id number]     Height:       70.0 in Accession #:    [id number]    Weight:       179.7 lb Date of Birth:  01/14/1946     BSA:          1.994 m Patient Age:    76 years      BP:           117/52 mmHg Patient Gender: M             HR:           73 bpm. Exam Location:  Inpatient Procedure: 2D  Echo, Cardiac Doppler and Color Doppler Indications:    Copngestive Heart Failure  History:        Patient has prior history of Echocardiogram examinations, most                 recent 01/02/2023. CHF, CAD, COPD, Carotid Disease and PAD,                 Arrythmias:Atrial Flutter; Risk Factors:Diabetes, Dyslipidemia                 and Hypertension.  Sonographer:    Wallie Char Referring Phys: [phone] [name] D CLEGG IMPRESSIONS  1. Left ventricular ejection fraction, by estimation, is 30%. The left ventricle has moderate to severely decreased function. The left ventricle demonstrates regional wall motion abnormalities with severe hypokinesis of the septal, anterior and inferior  walls. There was relative preservation of anterolateral/inferolateral wall motion. Left ventricular diastolic parameters are consistent with Grade II diastolic dysfunction (pseudonormalization).  2. Right ventricular systolic function is moderately reduced. The right ventricular size is mildly enlarged. There is severely elevated pulmonary artery systolic pressure. The estimated right ventricular systolic pressure is 63.2 mmHg.  3. Left atrial size was mildly dilated.  4. Right atrial size was mildly dilated.  5.  The mitral valve is normal in structure. Mild mitral valve regurgitation. No evidence of mitral stenosis.  6. Tricuspid valve regurgitation is mild to moderate.  7. The aortic valve is tricuspid. There is moderate calcification of the aortic valve. Aortic valve regurgitation is moderate. No aortic stenosis is present.  8. The inferior vena cava is dilated in size with <50% respiratory variability, suggesting right atrial pressure of 15 mmHg. FINDINGS  Left Ventricle: Left ventricular ejection fraction, by estimation, is 30%. The left ventricle has moderate to severely decreased function. The left ventricle demonstrates regional wall motion abnormalities. The left ventricular internal cavity size was normal in size. There is no left ventricular hypertrophy. Left ventricular diastolic parameters are consistent with Grade II diastolic dysfunction (pseudonormalization). Right Ventricle: The right ventricular size is mildly enlarged. No increase in right ventricular wall thickness. Right ventricular systolic function is moderately reduced. There is severely elevated pulmonary artery systolic pressure. The tricuspid regurgitant velocity is 3.47 m/s, and with an assumed right atrial pressure of 15 mmHg, the estimated right ventricular systolic pressure is 63.2 mmHg. Left Atrium: Left atrial size was mildly dilated. Right Atrium: Right atrial size was mildly dilated. Pericardium: There is no evidence of pericardial effusion. Mitral Valve: The mitral valve is normal in structure. Mild to moderate mitral annular calcification. Mild mitral valve regurgitation. No evidence of mitral valve stenosis. MV peak gradient, 7.7 mmHg. The mean mitral valve gradient is 2.0 mmHg. Tricuspid Valve: The tricuspid valve is normal in structure. Tricuspid valve regurgitation is mild to moderate. Aortic Valve: The aortic valve is tricuspid. There is moderate calcification of the aortic valve. Aortic valve regurgitation is moderate. Aortic  regurgitation PHT measures 243 msec. No aortic stenosis is present. Aortic valve mean gradient measures 7.0 mmHg. Aortic valve peak gradient measures 13.0 mmHg. Aortic valve area, by VTI measures 2.18 cm. Pulmonic Valve: The pulmonic valve was normal in structure. Pulmonic valve regurgitation is trivial. Aorta: The aortic root is normal in size and structure. Venous: The inferior vena cava is dilated in size with less than 50% respiratory variability, suggesting right atrial pressure of 15 mmHg. IAS/Shunts: No atrial level shunt detected by color flow Doppler.  LEFT VENTRICLE PLAX 2D LVIDd:  5.40 cm      Diastology LVIDs:         4.50 cm      LV e' medial:    6.19 cm/s LV PW:         0.90 cm      LV E/e' medial:  20.0 LV IVS:        0.90 cm      LV e' lateral:   12.70 cm/s LVOT diam:     1.90 cm      LV E/e' lateral: 9.8 LV SV:         80 LV SV Index:   40 LVOT Area:     2.84 cm  LV Volumes (MOD) LV vol d, MOD A2C: 152.0 ml LV vol d, MOD A4C: 151.0 ml LV vol s, MOD A2C: 96.9 ml LV vol s, MOD A4C: 96.4 ml LV SV MOD A2C:     55.1 ml LV SV MOD A4C:     151.0 ml LV SV MOD BP:      55.5 ml RIGHT VENTRICLE            IVC RV Basal diam:  4.20 cm    IVC diam: 2.40 cm RV S prime:     5.80 cm/s TAPSE (M-mode): 1.3 cm LEFT ATRIUM             Index        RIGHT ATRIUM           Index LA diam:        4.50 cm 2.26 cm/m   RA Area:     18.00 cm LA Vol (A2C):   64.4 ml 32.29 ml/m  RA Volume:   47.10 ml  23.62 ml/m LA Vol (A4C):   53.7 ml 26.93 ml/m LA Biplane Vol: 61.8 ml 30.99 ml/m  AORTIC VALVE                     PULMONIC VALVE AV Area (Vmax):    2.16 cm      PR End Diast Vel: 9.99 msec AV Area (Vmean):   2.26 cm AV Area (VTI):     2.18 cm AV Vmax:           180.50 cm/s AV Vmean:          121.000 cm/s AV VTI:            0.365 m AV Peak Grad:      13.0 mmHg AV Mean Grad:      7.0 mmHg LVOT Vmax:         137.50 cm/s LVOT Vmean:        96.350 cm/s LVOT VTI:          0.280 m LVOT/AV VTI ratio: 0.77 AI PHT:             243 msec AR Vena Contracta: 0.40 cm  AORTA Ao Root diam: 3.20 cm Ao Asc diam:  3.50 cm MITRAL VALVE                TRICUSPID VALVE MV Area (PHT): 3.37 cm     TR Peak grad:   48.2 mmHg MV Area VTI:   2.52 cm     TR Vmax:        347.00 cm/s MV Peak grad:  7.7 mmHg MV Mean grad:  2.0 mmHg     SHUNTS MV Vmax:       1.39 m/s     Systemic  VTI:  0.28 m MV Vmean:      67.2 cm/s    Systemic Diam: 1.90 cm MV Decel Time: 225 msec MV E velocity: 124.00 cm/s MV A velocity: 57.20 cm/s MV E/A ratio:  2.17 Dalton McleanMD Electronically signed by Wilfred Lacy Signature Date/Time: 02/16/2023/4:28:23 PM    Final      Scheduled Meds:  apixaban  5 mg Oral BID   Chlorhexidine Gluconate Cloth  6 each Topical Daily   fluticasone furoate-vilanterol  1 puff Inhalation Daily   furosemide  80 mg Intravenous BID   insulin aspart  0-5 Units Subcutaneous QHS   insulin aspart  0-9 Units Subcutaneous TID WC   insulin glargine-yfgn  20 Units Subcutaneous Daily   linaclotide  72 mcg Oral q morning   pantoprazole  40 mg Oral Daily   rosuvastatin  20 mg Oral QHS   sodium chloride flush  10-40 mL Intracatheter Q12H   sodium chloride flush  3 mL Intravenous Q12H   umeclidinium bromide  1 puff Inhalation Daily   Continuous Infusions:  amiodarone 30 mg/hr (02/17/23 1300)   milrinone 0.25 mcg/kg/min (02/17/23 1300)     LOS: 6 days    Time spent:    Zannie Cove, MD [facility]   02/17/2023, 1:39 PM

## 2023-02-17 NOTE — H&P (View-Only) (Signed)
  Advanced Heart Failure Rounding Note  PCP-Cardiologist: Philip Nahser, MD   Subjective:   -4/20 while on dobutamine patient went into atrial flutter with RVR, became hypotensive.  Decision made to start amiodarone and transferred to CVICU. 4/22 CO-OX marginal. Milrinone increased to 0.25 mcg. Diuresed with IV lasix.  Echo EF 30%. RV moderately reduced. RVSP 63.   Negative 1.1. liters.   On amio 30 mg per hour.   Creatinine 2.8>3.08.>2.75   Feels ok. Denies SOB.   Objective:   Weight Range: 81.5 kg Body mass index is 25.78 kg/m.   Vital Signs:   Temp:  [97.4 F (36.3 C)-99.5 F (37.5 C)] 99.5 F (37.5 C) (04/23 0817) Pulse Rate:  [61-78] 65 (04/23 0600) Resp:  [13-27] 17 (04/23 0600) BP: (112-149)/(48-107) 112/48 (04/23 0600) SpO2:  [83 %-95 %] 94 % (04/23 0811) Last BM Date : 02/15/23  Weight change: Filed Weights   02/13/23 0555 02/14/23 0448 02/16/23 0420  Weight: 86.5 kg 82.5 kg 81.5 kg    Intake/Output:   Intake/Output Summary (Last 24 hours) at 02/17/2023 0822 Last data filed at 02/17/2023 0818 Gross per 24 hour  Intake 756.07 ml  Output 2475 ml  Net -1718.93 ml   CVP 9 Physical Exam  General:   No resp difficulty HEENT: normal Neck: supple. JVP 8-9 . Carotids 2+ bilat; no bruits. No lymphadenopathy or thryomegaly appreciated. Cor: PMI nondisplaced. Regular rate & rhythm. No rubs, gallops or murmurs. Lungs: clear Abdomen: soft, nontender, nondistended. No hepatosplenomegaly. No bruits or masses. Good bowel sounds. Extremities: no cyanosis, clubbing, rash, R and LLE 1+edema RUE PICC Neuro: alert & orientedx3, cranial nerves grossly intact. moves all 4 extremities w/o difficulty. Affect pleasant   Telemetry  SR 60-70s   EKG    No new EKG to review  Labs    CBC Recent Labs    02/14/23 0912 02/15/23 0445  WBC 4.1 5.3  HGB 11.6* 11.8*  HCT 36.4* 36.3*  MCV 85.8 84.6  PLT 153 170   Basic Metabolic Panel Recent Labs     02/16/23 0413 02/17/23 0358  NA 130* 127*  K 3.9 3.3*  CL 86* 87*  CO2 28 26  GLUCOSE 226* 268*  BUN 66* 64*  CREATININE 3.08* 2.75*  CALCIUM 9.0 8.3*  MG 2.1 2.2   BNP (last 3 results) Recent Labs    01/20/23 1357 02/02/23 1130 02/10/23 1302  BNP 1,939.2* 2,275.8* 2,508.7*      Imaging    ECHOCARDIOGRAM COMPLETE  Result Date: 02/16/2023    ECHOCARDIOGRAM REPORT   Patient Name:   [name] Date of Exam: 02/16/2023 Medical Rec #:  [id number]     Height:       70.0 in Accession #:    [id number]    Weight:       179.7 lb Date of Birth:  06/13/1946     BSA:          1.994 m Patient Age:    77 years      BP:           117/52 mmHg Patient Gender: M             HR:           73 bpm. Exam Location:  Inpatient Procedure: 2D Echo, Cardiac Doppler and Color Doppler Indications:    Copngestive Heart Failure  History:        Patient has prior history of Echocardiogram examinations, most                   recent 01/02/2023. CHF, CAD, COPD, Carotid Disease and PAD,                 Arrythmias:Atrial Flutter; Risk Factors:Diabetes, Dyslipidemia                 and Hypertension.  Sonographer:    Molly Halloran Referring Phys: [id number] [name] D CLEGG IMPRESSIONS  1. Left ventricular ejection fraction, by estimation, is 30%. The left ventricle has moderate to severely decreased function. The left ventricle demonstrates regional wall motion abnormalities with severe hypokinesis of the septal, anterior and inferior  walls. There was relative preservation of anterolateral/inferolateral wall motion. Left ventricular diastolic parameters are consistent with Grade II diastolic dysfunction (pseudonormalization).  2. Right ventricular systolic function is moderately reduced. The right ventricular size is mildly enlarged. There is severely elevated pulmonary artery systolic pressure. The estimated right ventricular systolic pressure is 63.2 mmHg.  3. Left atrial size was mildly dilated.  4. Right atrial size was mildly  dilated.  5. The mitral valve is normal in structure. Mild mitral valve regurgitation. No evidence of mitral stenosis.  6. Tricuspid valve regurgitation is mild to moderate.  7. The aortic valve is tricuspid. There is moderate calcification of the aortic valve. Aortic valve regurgitation is moderate. No aortic stenosis is present.  8. The inferior vena cava is dilated in size with <50% respiratory variability, suggesting right atrial pressure of 15 mmHg. FINDINGS  Left Ventricle: Left ventricular ejection fraction, by estimation, is 30%. The left ventricle has moderate to severely decreased function. The left ventricle demonstrates regional wall motion abnormalities. The left ventricular internal cavity size was normal in size. There is no left ventricular hypertrophy. Left ventricular diastolic parameters are consistent with Grade II diastolic dysfunction (pseudonormalization). Right Ventricle: The right ventricular size is mildly enlarged. No increase in right ventricular wall thickness. Right ventricular systolic function is moderately reduced. There is severely elevated pulmonary artery systolic pressure. The tricuspid regurgitant velocity is 3.47 m/s, and with an assumed right atrial pressure of 15 mmHg, the estimated right ventricular systolic pressure is 63.2 mmHg. Left Atrium: Left atrial size was mildly dilated. Right Atrium: Right atrial size was mildly dilated. Pericardium: There is no evidence of pericardial effusion. Mitral Valve: The mitral valve is normal in structure. Mild to moderate mitral annular calcification. Mild mitral valve regurgitation. No evidence of mitral valve stenosis. MV peak gradient, 7.7 mmHg. The mean mitral valve gradient is 2.0 mmHg. Tricuspid Valve: The tricuspid valve is normal in structure. Tricuspid valve regurgitation is mild to moderate. Aortic Valve: The aortic valve is tricuspid. There is moderate calcification of the aortic valve. Aortic valve regurgitation is moderate.  Aortic regurgitation PHT measures 243 msec. No aortic stenosis is present. Aortic valve mean gradient measures 7.0 mmHg. Aortic valve peak gradient measures 13.0 mmHg. Aortic valve area, by VTI measures 2.18 cm. Pulmonic Valve: The pulmonic valve was normal in structure. Pulmonic valve regurgitation is trivial. Aorta: The aortic root is normal in size and structure. Venous: The inferior vena cava is dilated in size with less than 50% respiratory variability, suggesting right atrial pressure of 15 mmHg. IAS/Shunts: No atrial level shunt detected by color flow Doppler.  LEFT VENTRICLE PLAX 2D LVIDd:         5.40 cm      Diastology LVIDs:         4.50 cm      LV e' medial:    6.19 cm/s LV PW:           0.90 cm      LV E/e' medial:  20.0 LV IVS:        0.90 cm      LV e' lateral:   12.70 cm/s LVOT diam:     1.90 cm      LV E/e' lateral: 9.8 LV SV:         80 LV SV Index:   40 LVOT Area:     2.84 cm  LV Volumes (MOD) LV vol d, MOD A2C: 152.0 ml LV vol d, MOD A4C: 151.0 ml LV vol s, MOD A2C: 96.9 ml LV vol s, MOD A4C: 96.4 ml LV SV MOD A2C:     55.1 ml LV SV MOD A4C:     151.0 ml LV SV MOD BP:      55.5 ml RIGHT VENTRICLE            IVC RV Basal diam:  4.20 cm    IVC diam: 2.40 cm RV S prime:     5.80 cm/s TAPSE (M-mode): 1.3 cm LEFT ATRIUM             Index        RIGHT ATRIUM           Index LA diam:        4.50 cm 2.26 cm/m   RA Area:     18.00 cm LA Vol (A2C):   64.4 ml 32.29 ml/m  RA Volume:   47.10 ml  23.62 ml/m LA Vol (A4C):   53.7 ml 26.93 ml/m LA Biplane Vol: 61.8 ml 30.99 ml/m  AORTIC VALVE                     PULMONIC VALVE AV Area (Vmax):    2.16 cm      PR End Diast Vel: 9.99 msec AV Area (Vmean):   2.26 cm AV Area (VTI):     2.18 cm AV Vmax:           180.50 cm/s AV Vmean:          121.000 cm/s AV VTI:            0.365 m AV Peak Grad:      13.0 mmHg AV Mean Grad:      7.0 mmHg LVOT Vmax:         137.50 cm/s LVOT Vmean:        96.350 cm/s LVOT VTI:          0.280 m LVOT/AV VTI ratio: 0.77 AI PHT:             243 msec AR Vena Contracta: 0.40 cm  AORTA Ao Root diam: 3.20 cm Ao Asc diam:  3.50 cm MITRAL VALVE                TRICUSPID VALVE MV Area (PHT): 3.37 cm     TR Peak grad:   48.2 mmHg MV Area VTI:   2.52 cm     TR Vmax:        347.00 cm/s MV Peak grad:  7.7 mmHg MV Mean grad:  2.0 mmHg     SHUNTS MV Vmax:       1.39 m/s     Systemic VTI:  0.28 m MV Vmean:      67.2 cm/s    Systemic Diam: 1.90 cm MV Decel Time: 225 msec MV E velocity: 124.00 cm/s MV A velocity: 57.20 cm/s MV E/A ratio:  2.17 Shamyia Grandpre McleanMD   Electronically signed by Shepard Keltz McleanMD Signature Date/Time: 02/16/2023/4:28:23 PM    Final      Medications:     Scheduled Medications:  apixaban  5 mg Oral BID   Chlorhexidine Gluconate Cloth  6 each Topical Daily   fluticasone furoate-vilanterol  1 puff Inhalation Daily   furosemide  80 mg Intravenous BID   insulin aspart  0-5 Units Subcutaneous QHS   insulin aspart  0-9 Units Subcutaneous TID WC   insulin glargine-yfgn  13 Units Subcutaneous Daily   linaclotide  72 mcg Oral q morning   pantoprazole  40 mg Oral Daily   potassium chloride  40 mEq Oral Q4H   rosuvastatin  20 mg Oral QHS   sodium chloride flush  10-40 mL Intracatheter Q12H   umeclidinium bromide  1 puff Inhalation Daily    Infusions:  amiodarone 30 mg/hr (02/17/23 0643)   milrinone 0.25 mcg/kg/min (02/17/23 0643)    PRN Medications: acetaminophen, ALPRAZolam, ipratropium-albuterol, phenol, sodium chloride flush, traZODone   Patient Profile   [name] is a 76 y.o. male with CAD c/p CABG 2003, HFrEF, chronic DOE, Rt CEA >18yrs ago, Lt CEA 14', HTN, DM2, HLD, PAD w/ Rt SFA and Rt politeal artery vascular stents 15', prostate cancer. Admitted with a/c HFrEF.  Assessment/Plan  Acute on chronic heart failure with reduced ejection fraction exacerbation - Echo in 3/24 with EF 35-40%, RV normal.  - Echo repeated lower 30% RV moderately reduced.  -Severely volume overloaded on exam likely due to inadequate  outpatient  Over the weekend  switched from DBA to milrinone.  -Yesterday milrinone increased to 0.25 mcg. CO-OX 76%.  - CVP 9 . Give 2 doses of IV lasix today.  - GDMT limited by hypotension/AKI.  - RHC tomorrow to further assess.   2.  Persistent atrial flutter - Now back in normal sinus rhythm.  Continue IV amio while on milrinone.  - Convert to PO amio after he is off milrinone.  -Continue apixaban 5 mg BID   3.  CAD status post CABG -Holding off on left heart cath due to advanced CKD   4.Carotid Disease - S/p b/l CEA - continue statin   5. PAD - s/p intervention 2015 Rt SFA and Rt politeal artery vascular stents - continue statin    6.HTN - therapy as above   7.DM 2 - continue SGLT2i - Per primary    8. COPD/emphysema - Uses oxygen as needed---> wean as tolerated - once euvolemic start bisoprolol   9. AKI on CKD stage 4 Baseline creatinine ~ 2.2  -Creatinine 2.8>3>2.75.   RHC tomorrow. NPO after MN.   Length of Stay: 6  [name] Clegg, NP  02/17/2023, 8:22 AM  Advanced Heart Failure Team Pager [phone] (M-F; 7a - 5p)  Please contact [facility] for night-coverage after hours (5p -7a ) and weekends on [email or web address]  Patient seen with NP, agree with the above note.   Echo yesterday showed EF 30%, moderately decreased RV dysfunction with mild RVE, PASP 63 mmHg, IVC dilated.   I/Os net negative 1153, co-ox 76%.  Milrinone 0.25 mcg/kg/min, CVP around 10 on my read. Creatinine lower at 2.75.   General: NAD Neck: JVP 10-12 cm, no thyromegaly or thyroid nodule.  Lungs: Clear to auscultation bilaterally with normal respiratory effort. CV: Nondisplaced PMI.  Heart regular S1/S2, no S3/S4, no murmur.  1+ edema to knees.  Abdomen: Soft, nontender, no hepatosplenomegaly, no distention.  Skin: Intact without lesions or rashes.  Neurologic: Alert and oriented   x 3.  Psych: Normal affect. Extremities: No clubbing or cyanosis.  HEENT: Normal.   Needs further diuresis,  continue Lasix 80 mg IV bid today.  Will continue milrinone for now.  Creatinine trending down. Plan for RHC tomorrow to assess filling pressures/PA pressure. Discussed risks/benefits and patient agrees to procedure.   He remains in NSR, continue amiodarone gtt while on milrinone gtt. Anticoagulation with apixaban.   Malcolm Hetz 02/17/2023 1:57 PM   

## 2023-02-17 NOTE — Inpatient Diabetes Management (Signed)
Inpatient Diabetes Program Recommendations  AACE/ADA: New Consensus Statement on Inpatient Glycemic Control (2015)  Target Ranges:  Prepandial:   less than 140 mg/dL      Peak postprandial:   less than 180 mg/dL (1-2 hours)      Critically ill patients:  140 - 180 mg/dL   Lab Results  Component Value Date   GLUCAP 211 (H) 02/17/2023   HGBA1C 7.0 (H) 09/20/2022    Review of Glycemic Control  Latest Reference Range & Units 02/16/23 16:19 02/16/23 21:34 02/17/23 06:34 02/17/23 08:13 02/17/23 10:38  Glucose-Capillary 70 - 99 mg/dL 161 (H) 096 (H) 045 (H) 243 (H) 211 (H)  (H): Data is abnormally high Diabetes history: DM2 Outpatient Diabetes medications: Novolin N 40 units am, 35 units hs, Novolin R 6-15 units tid meal coverage Current orders for Inpatient glycemic control: Semglee 10 units QD, Novolog 0-9 units tid, 0-5 units hs correction   Inpatient Diabetes Program Recommendations:   Consider: -Add Novolog 3 units tid meal coverage if eats 50%  Thanks, Lujean Rave, MSN, RNC-OB Diabetes Coordinator [phone] (8a-5p)

## 2023-02-17 NOTE — Progress Notes (Signed)
Progress Note   Patient: [name] ZOX:[id number] DOB: 05/18/1946 DOA: 02/10/2023     6 DOS: the patient was seen and examined on 02/17/2023   Brief hospital course: Mr. Lahaie was admitted to the hospital with the working diagnosis of heart failure exacerbation.   77 yo male with the past medical history of heart failure, T2DM, CKD, COPD chronic hypoxemic respiratory failure and hypertension who presented with lower extremity edema. Recent hospitalization 03/26 to 01/24/23 for heart failure exacerbation. He was discharged with 20 mg furosemide. At home he developed worsening lower extremity edema. Cardiology outpatient follow up he had gain 5 lbs from the time of his discharge. At home he continue to have worsening edema, and weight gain for 9 lbs more. On his initial physical examination his blood pressure was 129/82, HR 86, RR 20 and 02 saturation 92%, lungs with bilateral rales, more left than right, positive JVD, heart with S1 and S2 present and rhythmic, abdomen with no distention, positive lower extremity edema pitting.   Na 135, K 4,0 Cl 102 bicarbonate 24 glucose 169, bun 49 cr 2,32  BNP 2,508  Wbc 5,9 hgb 11,8 plt 184   Chest radiograph with cardiomegaly, bilateral hilar vascular congestion, with bilateral interstitial infiltrates, small bilateral pleural effusions, and fluid in the right fissure.  Sternotomy wires in place.   EKG 82 bpm, normal axis, right bundle branch block, sinus rhythm with 1st degree AV block, with no significant ST segment or  T wave changes.   Patient has been placed on furosemide for diuresis.   04/19 volume status is improving but not yet back to baseline.  Patient placed on dobutamine infusion.  04/20 patient developed VT and was transferred to the intensive care unit 2H per cardiology recommendations.   04/21: CVP 14, still expressing dyspnea on exertion and demonstrating signs of fluid overload.  Rate controlled with amiodarone drip.  Following  cardiology/heart failure service recommendation milrinone has been started.  Continue IV diuresis. 04/22 patient on milrinone infusion.  04/23 patient with improvement in volume status but not back to baseline. Plan for right heart catheterization.   Assessment and Plan: * Acute on chronic systolic CHF (congestive heart failure) -Echocardiogram with reduced LV systolic function with EF 35 to 40%, no LVH, RV systolic function preserved, RVSP 45,5 mid dilatation of LA, no pericardial effusion, mild to moderate TR, mild aortic stenosis.  Low output heart failure.   Urine output is 2,175 ml Systolic blood pressure is 123 to 125 mmHg.  SV02 75,8   Continue diuresis with furosemide 80 mg IV q12 hrs Milrinone for inotropic support.  IV amiodarone for non sustained ventricular tachycardia.  Plan for right heart catheterization.   Acute kidney injury superimposed on chronic kidney disease -CKD stage 3a at baseline Hyponatremia.   Renal function with serum cr at 2,75 with K at 3,3 and serum bicarbonate at 26. Na is 127 and Mg 2.2 BUN 64.   Plan to continue diuresis with furosemide 80 mg IV q12. Kcl to correct hypokalemia.  Follow up renal function in am.   Atrial flutter Rate control with amiodarone.  Ectopy has improved, with amiodarone. Anticoagulation with apixaban Continue telemetry monitoring.    GERD (gastroesophageal reflux disease) -Continue protonix 40 mg daily.  Type 2 diabetes mellitus with hyperlipidemia -Uncontrolled T2DM with hyperglycemia.   Continue sliding scale insulin and adjusted dose of basal long-acting regimen, increased to 20 units of glargine daily.  If persistent high glucose will increase dose of sliding  scale.   Fasting glucose this am 268 mg/dl.   Continue with statin therapy.   COPD (chronic obstructive pulmonary disease) -No clinical signs of exacerbation. -Continue current medical management.   DNR (do not resuscitate) -Verified pt is a  DNR. Pt is emphatic that he is a DNR/DNI. -Daughter at bedside also confirming code Status.        Subjective: Patient is feeling better, dyspnea and edema are improving but not back to baseline.   Physical Exam: Vitals:   02/17/23 0900 02/17/23 1000 02/17/23 1100 02/17/23 1149  BP:   (!) 141/61   Pulse: 74 71 73   Resp: Temp:    98.9 F (37.2 C)  TempSrc:    Oral  SpO2: 91% 92% 94%   Weight:      Height:       Neurology awake and alert ENT with mild pallor Cardiovascular with S1 and S2 present, irregularly irregular with no gallops, rubs or murmurs Moderate JVD + lower extremity edema Respiratory with mild rales at bases with no wheezing or rhonchi Abdomen with no distention   Data Reviewed:    Family Communication: no family at the bedside   Disposition: Status is: Inpatient Remains inpatient appropriate because: heart failure, IV inotropic support and IV diuresis   Planned Discharge Destination: Home    Author: Coralie Keens, MD 02/17/2023 12:06 PM  For on call review [email or web address].

## 2023-02-17 NOTE — Progress Notes (Signed)
Pt sats keep dropping into the 80's while sleeping. Pt placed on HFNC while sleeping to keep sats in normal range.

## 2023-02-17 NOTE — Progress Notes (Signed)
Advanced Heart Failure Rounding Note  PCP-Cardiologist: Kristeen Miss, MD   Subjective:   -4/20 while on dobutamine patient went into atrial flutter with RVR, became hypotensive.  Decision made to start amiodarone and transferred to CVICU. 4/22 CO-OX marginal. Milrinone increased to 0.25 mcg. Diuresed with IV lasix.  Echo EF 30%. RV moderately reduced. RVSP 63.   Negative 1.1. liters.   On amio 30 mg per hour.   Creatinine 2.8>3.08.>2.75   Feels ok. Denies SOB.   Objective:   Weight Range: 81.5 kg Body mass index is 25.78 kg/m.   Vital Signs:   Temp:  [97.4 F (36.3 C)-99.5 F (37.5 C)] 99.5 F (37.5 C) (04/23 0817) Pulse Rate:  [61-78] 65 (04/23 0600) Resp:  [13-27] 17 (04/23 0600) BP: (112-149)/(48-107) 112/48 (04/23 0600) SpO2:  [83 %-95 %] 94 % (04/23 0811) Last BM Date : 02/15/23  Weight change: Filed Weights   02/13/23 0555 02/14/23 0448 02/16/23 0420  Weight: 86.5 kg 82.5 kg 81.5 kg    Intake/Output:   Intake/Output Summary (Last 24 hours) at 02/17/2023 4132 Last data filed at 02/17/2023 0818 Gross per 24 hour  Intake 756.07 ml  Output 2475 ml  Net -1718.93 ml   CVP 9 Physical Exam  General:   No resp difficulty HEENT: normal Neck: supple. JVP 8-9 . Carotids 2+ bilat; no bruits. No lymphadenopathy or thryomegaly appreciated. Cor: PMI nondisplaced. Regular rate & rhythm. No rubs, gallops or murmurs. Lungs: clear Abdomen: soft, nontender, nondistended. No hepatosplenomegaly. No bruits or masses. Good bowel sounds. Extremities: no cyanosis, clubbing, rash, R and LLE 1+edema RUE PICC Neuro: alert & orientedx3, cranial nerves grossly intact. moves all 4 extremities w/o difficulty. Affect pleasant   Telemetry  SR 60-70s   EKG    No new EKG to review  Labs    CBC Recent Labs    02/14/23 0912 02/15/23 0445  WBC 4.1 5.3  HGB 11.6* 11.8*  HCT 36.4* 36.3*  MCV 85.8 84.6  PLT 153 170   Basic Metabolic Panel Recent Labs     02/16/23 0413 02/17/23 0358  NA 130* 127*  K 3.9 3.3*  CL 86* 87*  CO2 28 26  GLUCOSE 226* 268*  BUN 66* 64*  CREATININE 3.08* 2.75*  CALCIUM 9.0 8.3*  MG 2.1 2.2   BNP (last 3 results) Recent Labs    01/20/23 1357 02/02/23 1130 02/10/23 1302  BNP 1,939.2* 2,275.8* 2,508.7*      Imaging    ECHOCARDIOGRAM COMPLETE  Result Date: 02/16/2023    ECHOCARDIOGRAM REPORT   Patient Name:   CALEL PISARSKI Date of Exam: 02/16/2023 Medical Rec #:  [id number]     Height:       70.0 in Accession #:    [id number]    Weight:       179.7 lb Date of Birth:  01/03/1946     BSA:          1.994 m Patient Age:    77 years      BP:           117/52 mmHg Patient Gender: M             HR:           73 bpm. Exam Location:  Inpatient Procedure: 2D Echo, Cardiac Doppler and Color Doppler Indications:    Copngestive Heart Failure  History:        Patient has prior history of Echocardiogram examinations, most  recent 01/02/2023. CHF, CAD, COPD, Carotid Disease and PAD,                 Arrythmias:Atrial Flutter; Risk Factors:Diabetes, Dyslipidemia                 and Hypertension.  Sonographer:    Wallie Char Referring Phys: [phone] [name] D CLEGG IMPRESSIONS  1. Left ventricular ejection fraction, by estimation, is 30%. The left ventricle has moderate to severely decreased function. The left ventricle demonstrates regional wall motion abnormalities with severe hypokinesis of the septal, anterior and inferior  walls. There was relative preservation of anterolateral/inferolateral wall motion. Left ventricular diastolic parameters are consistent with Grade II diastolic dysfunction (pseudonormalization).  2. Right ventricular systolic function is moderately reduced. The right ventricular size is mildly enlarged. There is severely elevated pulmonary artery systolic pressure. The estimated right ventricular systolic pressure is 63.2 mmHg.  3. Left atrial size was mildly dilated.  4. Right atrial size was mildly  dilated.  5. The mitral valve is normal in structure. Mild mitral valve regurgitation. No evidence of mitral stenosis.  6. Tricuspid valve regurgitation is mild to moderate.  7. The aortic valve is tricuspid. There is moderate calcification of the aortic valve. Aortic valve regurgitation is moderate. No aortic stenosis is present.  8. The inferior vena cava is dilated in size with <50% respiratory variability, suggesting right atrial pressure of 15 mmHg. FINDINGS  Left Ventricle: Left ventricular ejection fraction, by estimation, is 30%. The left ventricle has moderate to severely decreased function. The left ventricle demonstrates regional wall motion abnormalities. The left ventricular internal cavity size was normal in size. There is no left ventricular hypertrophy. Left ventricular diastolic parameters are consistent with Grade II diastolic dysfunction (pseudonormalization). Right Ventricle: The right ventricular size is mildly enlarged. No increase in right ventricular wall thickness. Right ventricular systolic function is moderately reduced. There is severely elevated pulmonary artery systolic pressure. The tricuspid regurgitant velocity is 3.47 m/s, and with an assumed right atrial pressure of 15 mmHg, the estimated right ventricular systolic pressure is 63.2 mmHg. Left Atrium: Left atrial size was mildly dilated. Right Atrium: Right atrial size was mildly dilated. Pericardium: There is no evidence of pericardial effusion. Mitral Valve: The mitral valve is normal in structure. Mild to moderate mitral annular calcification. Mild mitral valve regurgitation. No evidence of mitral valve stenosis. MV peak gradient, 7.7 mmHg. The mean mitral valve gradient is 2.0 mmHg. Tricuspid Valve: The tricuspid valve is normal in structure. Tricuspid valve regurgitation is mild to moderate. Aortic Valve: The aortic valve is tricuspid. There is moderate calcification of the aortic valve. Aortic valve regurgitation is moderate.  Aortic regurgitation PHT measures 243 msec. No aortic stenosis is present. Aortic valve mean gradient measures 7.0 mmHg. Aortic valve peak gradient measures 13.0 mmHg. Aortic valve area, by VTI measures 2.18 cm. Pulmonic Valve: The pulmonic valve was normal in structure. Pulmonic valve regurgitation is trivial. Aorta: The aortic root is normal in size and structure. Venous: The inferior vena cava is dilated in size with less than 50% respiratory variability, suggesting right atrial pressure of 15 mmHg. IAS/Shunts: No atrial level shunt detected by color flow Doppler.  LEFT VENTRICLE PLAX 2D LVIDd:         5.40 cm      Diastology LVIDs:         4.50 cm      LV e' medial:    6.19 cm/s LV PW:  0.90 cm      LV E/e' medial:  20.0 LV IVS:        0.90 cm      LV e' lateral:   12.70 cm/s LVOT diam:     1.90 cm      LV E/e' lateral: 9.8 LV SV:         80 LV SV Index:   40 LVOT Area:     2.84 cm  LV Volumes (MOD) LV vol d, MOD A2C: 152.0 ml LV vol d, MOD A4C: 151.0 ml LV vol s, MOD A2C: 96.9 ml LV vol s, MOD A4C: 96.4 ml LV SV MOD A2C:     55.1 ml LV SV MOD A4C:     151.0 ml LV SV MOD BP:      55.5 ml RIGHT VENTRICLE            IVC RV Basal diam:  4.20 cm    IVC diam: 2.40 cm RV S prime:     5.80 cm/s TAPSE (M-mode): 1.3 cm LEFT ATRIUM             Index        RIGHT ATRIUM           Index LA diam:        4.50 cm 2.26 cm/m   RA Area:     18.00 cm LA Vol (A2C):   64.4 ml 32.29 ml/m  RA Volume:   47.10 ml  23.62 ml/m LA Vol (A4C):   53.7 ml 26.93 ml/m LA Biplane Vol: 61.8 ml 30.99 ml/m  AORTIC VALVE                     PULMONIC VALVE AV Area (Vmax):    2.16 cm      PR End Diast Vel: 9.99 msec AV Area (Vmean):   2.26 cm AV Area (VTI):     2.18 cm AV Vmax:           180.50 cm/s AV Vmean:          121.000 cm/s AV VTI:            0.365 m AV Peak Grad:      13.0 mmHg AV Mean Grad:      7.0 mmHg LVOT Vmax:         137.50 cm/s LVOT Vmean:        96.350 cm/s LVOT VTI:          0.280 m LVOT/AV VTI ratio: 0.77 AI PHT:             243 msec AR Vena Contracta: 0.40 cm  AORTA Ao Root diam: 3.20 cm Ao Asc diam:  3.50 cm MITRAL VALVE                TRICUSPID VALVE MV Area (PHT): 3.37 cm     TR Peak grad:   48.2 mmHg MV Area VTI:   2.52 cm     TR Vmax:        347.00 cm/s MV Peak grad:  7.7 mmHg MV Mean grad:  2.0 mmHg     SHUNTS MV Vmax:       1.39 m/s     Systemic VTI:  0.28 m MV Vmean:      67.2 cm/s    Systemic Diam: 1.90 cm MV Decel Time: 225 msec MV E velocity: 124.00 cm/s MV A velocity: 57.20 cm/s MV E/A ratio:  2.17 Alfie Alderfer McleanMD  Electronically signed by Wilfred Lacy Signature Date/Time: 02/16/2023/4:28:23 PM    Final      Medications:     Scheduled Medications:  apixaban  5 mg Oral BID   Chlorhexidine Gluconate Cloth  6 each Topical Daily   fluticasone furoate-vilanterol  1 puff Inhalation Daily   furosemide  80 mg Intravenous BID   insulin aspart  0-5 Units Subcutaneous QHS   insulin aspart  0-9 Units Subcutaneous TID WC   insulin glargine-yfgn  13 Units Subcutaneous Daily   linaclotide  72 mcg Oral q morning   pantoprazole  40 mg Oral Daily   potassium chloride  40 mEq Oral Q4H   rosuvastatin  20 mg Oral QHS   sodium chloride flush  10-40 mL Intracatheter Q12H   umeclidinium bromide  1 puff Inhalation Daily    Infusions:  amiodarone 30 mg/hr (02/17/23 0643)   milrinone 0.25 mcg/kg/min (02/17/23 0643)    PRN Medications: acetaminophen, ALPRAZolam, ipratropium-albuterol, phenol, sodium chloride flush, traZODone   Patient Profile   Mr. Ganaway is a 77 y.o. male with CAD c/p CABG 2003, HFrEF, chronic DOE, Rt CEA >67yrs ago, Lt CEA 14', HTN, DM2, HLD, PAD w/ Rt SFA and Rt politeal artery vascular stents 15', prostate cancer. Admitted with a/c HFrEF.  Assessment/Plan  Acute on chronic heart failure with reduced ejection fraction exacerbation - Echo in 3/24 with EF 35-40%, RV normal.  - Echo repeated lower 30% RV moderately reduced.  -Severely volume overloaded on exam likely due to inadequate  outpatient  Over the weekend  switched from DBA to milrinone.  -Yesterday milrinone increased to 0.25 mcg. CO-OX 76%.  - CVP 9 . Give 2 doses of IV lasix today.  - GDMT limited by hypotension/AKI.  - RHC tomorrow to further assess.   2.  Persistent atrial flutter - Now back in normal sinus rhythm.  Continue IV amio while on milrinone.  - Convert to PO amio after he is off milrinone.  -Continue apixaban 5 mg BID   3.  CAD status post CABG -Holding off on left heart cath due to advanced CKD   4.Carotid Disease - S/p b/l CEA - continue statin   5. PAD - s/p intervention 2015 Rt SFA and Rt politeal artery vascular stents - continue statin    6.HTN - therapy as above   7.DM 2 - continue SGLT2i - Per primary    8. COPD/emphysema - Uses oxygen as needed---> wean as tolerated - once euvolemic start bisoprolol   9. AKI on CKD stage 4 Baseline creatinine ~ 2.2  -Creatinine 2.8>3>2.75.   RHC tomorrow. NPO after MN.   Length of Stay: 6  [name] Clegg, NP  02/17/2023, 8:22 AM  Advanced Heart Failure Team Pager [phone] (M-F; 7a - 5p)  Please contact [facility] for night-coverage after hours (5p -7a ) and weekends on [email or web address]  Patient seen with NP, agree with the above note.   Echo yesterday showed EF 30%, moderately decreased RV dysfunction with mild RVE, PASP 63 mmHg, IVC dilated.   I/Os net negative 1153, co-ox 76%.  Milrinone 0.25 mcg/kg/min, CVP around 10 on my read. Creatinine lower at 2.75.   General: NAD Neck: JVP 10-12 cm, no thyromegaly or thyroid nodule.  Lungs: Clear to auscultation bilaterally with normal respiratory effort. CV: Nondisplaced PMI.  Heart regular S1/S2, no S3/S4, no murmur.  1+ edema to knees.  Abdomen: Soft, nontender, no hepatosplenomegaly, no distention.  Skin: Intact without lesions or rashes.  Neurologic: Alert and oriented  x 3.  Psych: Normal affect. Extremities: No clubbing or cyanosis.  HEENT: Normal.   Needs further diuresis,  continue Lasix 80 mg IV bid today.  Will continue milrinone for now.  Creatinine trending down. Plan for RHC tomorrow to assess filling pressures/PA pressure. Discussed risks/benefits and patient agrees to procedure.   He remains in NSR, continue amiodarone gtt while on milrinone gtt. Anticoagulation with apixaban.   Marca Ancona 02/17/2023 1:57 PM

## 2023-02-18 ENCOUNTER — Encounter (HOSPITAL_COMMUNITY): Payer: Self-pay | Admitting: Cardiology

## 2023-02-18 ENCOUNTER — Encounter (HOSPITAL_COMMUNITY)
Admission: EM | Disposition: A | Discharge status: Home Health | Payer: Self-pay | Source: Home / Self Care | Attending: Internal Medicine

## 2023-02-18 DIAGNOSIS — I5023 Acute on chronic systolic (congestive) heart failure: Secondary | ICD-10-CM | POA: Diagnosis not present

## 2023-02-18 HISTORY — PX: RIGHT HEART CATH: CATH118263

## 2023-02-18 LAB — BASIC METABOLIC PANEL
Anion gap: 12 (ref 5–15)
BUN: 61 mg/dL — ABNORMAL HIGH (ref 8–23)
CO2: 28 mmol/L (ref 22–32)
Calcium: 8.5 mg/dL — ABNORMAL LOW (ref 8.9–10.3)
Chloride: 88 mmol/L — ABNORMAL LOW (ref 98–111)
Creatinine, Ser: 2.59 mg/dL — ABNORMAL HIGH (ref 0.61–1.24)
GFR, Estimated: 25 mL/min — ABNORMAL LOW (ref >60)
Glucose, Bld: 193 mg/dL — ABNORMAL HIGH (ref 70–99)
Potassium: 4 mmol/L (ref 3.5–5.1)
Sodium: 128 mmol/L — ABNORMAL LOW (ref 135–145)

## 2023-02-18 LAB — POCT I-STAT EG7
Acid-Base Excess: 6 mmol/L — ABNORMAL HIGH (ref 0.0–2.0)
Acid-Base Excess: 6 mmol/L — ABNORMAL HIGH (ref 0.0–2.0)
Bicarbonate: 29.8 mmol/L — ABNORMAL HIGH (ref 20.0–28.0)
Bicarbonate: 30.4 mmol/L — ABNORMAL HIGH (ref 20.0–28.0)
Calcium, Ion: 1.15 mmol/L (ref 1.15–1.40)
Calcium, Ion: 1.16 mmol/L (ref 1.15–1.40)
HCT: 36 % — ABNORMAL LOW (ref 39.0–52.0)
HCT: 36 % — ABNORMAL LOW (ref 39.0–52.0)
Hemoglobin: 12.2 g/dL — ABNORMAL LOW (ref 13.0–17.0)
Hemoglobin: 12.2 g/dL — ABNORMAL LOW (ref 13.0–17.0)
O2 Saturation: 50 %
O2 Saturation: 51 %
Potassium: 4.1 mmol/L (ref 3.5–5.1)
Potassium: 4.2 mmol/L (ref 3.5–5.1)
Sodium: 129 mmol/L — ABNORMAL LOW (ref 135–145)
Sodium: 129 mmol/L — ABNORMAL LOW (ref 135–145)
TCO2: 31 mmol/L (ref 22–32)
TCO2: 32 mmol/L (ref 22–32)
pCO2, Ven: 40.1 mmHg — ABNORMAL LOW (ref 44–60)
pCO2, Ven: 41.2 mmHg — ABNORMAL LOW (ref 44–60)
pH, Ven: 7.476 — ABNORMAL HIGH (ref 7.25–7.43)
pH, Ven: 7.479 — ABNORMAL HIGH (ref 7.25–7.43)
pO2, Ven: 25 mmHg — CL (ref 32–45)
pO2, Ven: 25 mmHg — CL (ref 32–45)

## 2023-02-18 LAB — GLUCOSE, CAPILLARY
Glucose-Capillary: 188 mg/dL — ABNORMAL HIGH (ref 70–99)
Glucose-Capillary: 195 mg/dL — ABNORMAL HIGH (ref 70–99)
Glucose-Capillary: 342 mg/dL — ABNORMAL HIGH (ref 70–99)
Glucose-Capillary: 297 mg/dL — ABNORMAL HIGH (ref 70–99)

## 2023-02-18 LAB — APTT: aPTT: 58 seconds — ABNORMAL HIGH (ref 24–36)

## 2023-02-18 LAB — COOXEMETRY PANEL
Carboxyhemoglobin: 1.8 % — ABNORMAL HIGH (ref 0.5–1.5)
Methemoglobin: 0.8 % (ref 0.0–1.5)
O2 Saturation: 69 %
Total hemoglobin: 10.9 g/dL — ABNORMAL LOW (ref 12.0–16.0)

## 2023-02-18 LAB — PROTIME-INR
INR: 1.9 — ABNORMAL HIGH (ref 0.8–1.2)
Prothrombin Time: 21.7 seconds — ABNORMAL HIGH (ref 11.4–15.2)

## 2023-02-18 SURGERY — RIGHT HEART CATH
Anesthesia: LOCAL

## 2023-02-18 MED ORDER — LIDOCAINE HCL (PF) 1 % IJ SOLN
INTRAMUSCULAR | Status: DC | PRN
  Administered 2023-02-18: 2 mL

## 2023-02-18 MED ORDER — OXYMETAZOLINE HCL 0.05 % NA SOLN
1.0000 | Freq: Once | NASAL | Status: AC
  Administered 2023-02-18: 1 via NASAL
  Filled 2023-02-18: qty 30

## 2023-02-18 MED ORDER — POTASSIUM CHLORIDE CRYS ER 20 MEQ PO TBCR
40.0000 meq | EXTENDED_RELEASE_TABLET | Freq: Once | ORAL | Status: AC
  Administered 2023-02-18: 40 meq via ORAL
  Filled 2023-02-18: qty 2

## 2023-02-18 MED ORDER — FENTANYL CITRATE (PF) 100 MCG/2ML IJ SOLN
INTRAMUSCULAR | Status: DC | PRN
  Administered 2023-02-18: 25 ug via INTRAVENOUS

## 2023-02-18 MED ORDER — FENTANYL CITRATE (PF) 100 MCG/2ML IJ SOLN
INTRAMUSCULAR | Status: AC
  Filled 2023-02-18: qty 2

## 2023-02-18 MED ORDER — FUROSEMIDE 10 MG/ML IJ SOLN
80.0000 mg | Freq: Once | INTRAMUSCULAR | Status: AC
  Administered 2023-02-18: 80 mg via INTRAVENOUS

## 2023-02-18 MED ORDER — INSULIN GLARGINE-YFGN 100 UNIT/ML ~~LOC~~ SOLN
24.0000 [IU] | Freq: Every day | SUBCUTANEOUS | Status: DC
  Administered 2023-02-18 – 2023-02-20 (×3): 24 [IU] via SUBCUTANEOUS
  Filled 2023-02-18 (×4): qty 0.24

## 2023-02-18 MED ORDER — LIDOCAINE HCL (PF) 1 % IJ SOLN
INTRAMUSCULAR | Status: AC
  Filled 2023-02-18: qty 30

## 2023-02-18 MED ORDER — TRANEXAMIC ACID FOR EPISTAXIS
500.0000 mg | Freq: Once | TOPICAL | Status: AC
  Administered 2023-02-18: 500 mg via TOPICAL
  Filled 2023-02-18: qty 10

## 2023-02-18 MED ORDER — OXYMETAZOLINE HCL 0.05 % NA SOLN
2.0000 | Freq: Once | NASAL | Status: AC
  Administered 2023-02-18: 2 via NASAL
  Filled 2023-02-18: qty 30

## 2023-02-18 MED ORDER — HEPARIN (PORCINE) IN NACL 1000-0.9 UT/500ML-% IV SOLN
INTRAVENOUS | Status: DC | PRN
  Administered 2023-02-18: 500 mL

## 2023-02-18 MED ORDER — POTASSIUM CHLORIDE CRYS ER 20 MEQ PO TBCR
40.0000 meq | EXTENDED_RELEASE_TABLET | ORAL | Status: AC
  Administered 2023-02-18 (×2): 40 meq via ORAL
  Filled 2023-02-18 (×2): qty 2

## 2023-02-18 MED ORDER — FUROSEMIDE 10 MG/ML IJ SOLN
12.0000 mg/h | INTRAVENOUS | Status: DC
  Administered 2023-02-18 – 2023-02-19 (×3): 12 mg/h via INTRAVENOUS
  Filled 2023-02-18 (×4): qty 20

## 2023-02-18 SURGICAL SUPPLY — 6 items
CATH BALLN WEDGE 5F 110CM (CATHETERS) IMPLANT
KIT HEART LEFT (KITS) ×2 IMPLANT
PACK CARDIAC CATHETERIZATION (CUSTOM PROCEDURE TRAY) ×2 IMPLANT
SHEATH GLIDE SLENDER 4/5FR (SHEATH) IMPLANT
TRANSDUCER W/STOPCOCK (MISCELLANEOUS) ×2 IMPLANT
WIRE MICROINTRODUCER 60CM (WIRE) IMPLANT

## 2023-02-18 NOTE — Progress Notes (Signed)
Patient ID: [name], male   DOB: [birth year], 77 y.o.   MRN: [id number]     Advanced Heart Failure Rounding Note  PCP-Cardiologist: Kristeen Miss, MD   Subjective:    - 4/20 while on dobutamine patient went into atrial flutter with RVR, became hypotensive.  Decision made to start amiodarone and transferred to CVICU. - 4/22 CO-OX marginal. Milrinone increased to 0.25 mcg. Diuresed with IV lasix.  Echo EF 30%. RV moderately reduced. RVSP 63.   Lasix 80 mg IV bid yesterday, I/Os even.   On amio 30 mg per hour and apixaban, remains in NSR.   Creatinine 2.8 > 3.08 > 2.75 > 2.59  RHC today as below.   RHC Procedural Findings (milrinone 0.25): Hemodynamics (mmHg) RA mean 12 RV 55/16 PA 57/22, mean 34 PCWP mean 24, v-waves to 35Oxygen saturations: PA 51% AO 86% Cardiac Output (Fick) 4.71  Cardiac Index (Fick) 2.37 PVR 2.1 WU   Objective:   Weight Range: 80.4 kg Body mass index is 25.43 kg/m.   Vital Signs:   Temp:  [97.8 F (36.6 C)-99.6 F (37.6 C)] 99.2 F (37.3 C) (04/24 0700) Pulse Rate:  [0-77] 0 (04/24 0938) Resp:  [12-33] 17 (04/24 0933) BP: (116-142)/(39-78) 117/50 (04/24 0938) SpO2:  [84 %-98 %] 85 % (04/24 0933) Weight:  [80.4 kg] 80.4 kg (04/24 0554) Last BM Date : 02/15/23  Weight change: Filed Weights   02/16/23 0420 02/17/23 0817 02/18/23 0554  Weight: 81.5 kg 81 kg 80.4 kg    Intake/Output:   Intake/Output Summary (Last 24 hours) at 02/18/2023 0939 Last data filed at 02/18/2023 0800 Gross per 24 hour  Intake 1423.7 ml  Output 1285 ml  Net 138.7 ml    Physical Exam   General: NAD Neck: JVP 14 cm, no thyromegaly or thyroid nodule.  Lungs: Clear to auscultation bilaterally with normal respiratory effort. CV: Nondisplaced PMI.  Heart regular S1/S2, no S3/S4, no murmur.  No peripheral edema.   Abdomen: Soft, nontender, no hepatosplenomegaly, no distention.  Skin: Intact without lesions or rashes.  Neurologic: Alert and oriented x 3.   Psych: Normal affect. Extremities: No clubbing or cyanosis.  HEENT: Normal.   Telemetry  SR 60-70s   EKG    No new EKG to review  Labs    CBC No results for input(s): "WBC", "NEUTROABS", "HGB", "HCT", "MCV", "PLT" in the last 72 hours.  Basic Metabolic Panel Recent Labs    40/98/11 0413 02/17/23 0358 02/18/23 0512  NA 130* 127* 128*  K 3.9 3.3* 4.0  CL 86* 87* 88*  CO2 GLUCOSE 226* 268* 193*  BUN 66* 64* 61*  CREATININE 3.08* 2.75* 2.59*  CALCIUM 9.0 8.3* 8.5*  MG 2.1 2.2  --    BNP (last 3 results) Recent Labs    01/20/23 1357 02/02/23 1130 02/10/23 1302  BNP 1,939.2* 2,275.8* 2,508.7*      Imaging    CARDIAC CATHETERIZATION  Result Date: 02/18/2023 1. Elevated filling pressures. 2. Moderate pulmonary venous hypertension. 3. Stable CO on milrinone.     Medications:     Scheduled Medications:  [MAR Hold] apixaban  5 mg Oral BID   [MAR Hold] Chlorhexidine Gluconate Cloth  6 each Topical Daily   [MAR Hold] fluticasone furoate-vilanterol  1 puff Inhalation Daily   furosemide  80 mg Intravenous Once   [MAR Hold] insulin aspart  0-5 Units Subcutaneous QHS   [MAR Hold] insulin aspart  0-9 Units Subcutaneous TID WC   [  MAR Hold] insulin glargine-yfgn  24 Units Subcutaneous Daily   [MAR Hold] linaclotide  72 mcg Oral q morning   [MAR Hold] pantoprazole  40 mg Oral Daily   [MAR Hold] rosuvastatin  20 mg Oral QHS   [MAR Hold] sodium chloride flush  10-40 mL Intracatheter Q12H   [MAR Hold] sodium chloride flush  3 mL Intravenous Q12H   [MAR Hold] umeclidinium bromide  1 puff Inhalation Daily    Infusions:  sodium chloride     sodium chloride     amiodarone 30 mg/hr (02/18/23 0800)   furosemide (LASIX) 200 mg in dextrose 5 % 100 mL (2 mg/mL) infusion     milrinone 0.25 mcg/kg/min (02/18/23 0800)    PRN Medications: sodium chloride, [MAR Hold] acetaminophen, [MAR Hold] ALPRAZolam, fentaNYL, Heparin (Porcine) in NaCl, [MAR Hold]  ipratropium-albuterol, lidocaine (PF), [MAR Hold] mouth rinse, [MAR Hold] phenol, [MAR Hold] sodium chloride flush, sodium chloride flush, [MAR Hold] traZODone   Patient Profile   [name] is a 77 y.o. male with CAD c/p CABG 2003, HFrEF, chronic DOE, Rt CEA >14yrs ago, Lt CEA 14', HTN, DM2, HLD, PAD w/ Rt SFA and Rt politeal artery vascular stents 15', prostate cancer. Admitted with a/c HFrEF.  Assessment/Plan   1. Acute on chronic systolic CHF: Ischemic cardiomyopathy. Echo in 3/24 with EF 35-40%, RV normal. Echo this admission with EF 30%, moderately decreased RV dysfunction with mild RVE, PASP 63 mmHg, IVC dilated. Low output HF this admission, has required inotrope. RHC today showed ongoing volume overload with stable cardiac output on milrinone 0.25. Prominent v-waves but only mild MR on echo (likely due to volume overload/diastolic dysfunction). GDMT limited by hypotension/AKI.   - Continue milrinone 0.25.  - Lasix 80 mg IV x 1 and Lasix gtt at 12 mg/hr today.  2.  Atrial flutter: Now in NSR.  - Continue IV amiodarone while on milrinone, convert to PO amio after he is off milrinone.  - Continue apixaban 5 mg BID 3. CAD: Status post CABG 2003.  No ACS this admission. No chest pain.  - No coronary angiography with no ACS and AKI.  4.Carotid Disease: S/p b/l CEA - continue statin 5. PAD: s/p intervention 2015 Rt SFA and Rt politeal artery vascular stents - continue statin  6. Type 2 DM: Per [facility].   7. COPD/emphysema: Uses 2 L home O2 as needed, requiring oxygen here.  8. AKI on CKD stage 4: Baseline creatinine ~ 2.2.  Creatinine 2.8>3>2.75>2.59, improving.   RHC tomorrow. NPO after MN.   Length of Stay: 7  Marca Ancona, MD  02/18/2023, 9:39 AM  Advanced Heart Failure Team Pager [phone] (M-F; 7a - 5p)  Please contact [facility] for night-coverage after hours (5p -7a ) and weekends on [email or web address]

## 2023-02-18 NOTE — TOC Progression Note (Signed)
Transition of Care [facility]) - Progression Note    Patient Details  Name: [name] MRN: [id number] Date of Birth: 16-Oct-1946  [facility]) CM/SW Contact  Graves-Bigelow, Lamar Laundry, RN Phone Number: 02/18/2023, 1:21 PM  Clinical Narrative: Patient was discussed in progression rounds. Plan for RHC-continues on Lasix gtt, IV Amio and IV Milrinone. 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 Patient has oxygen via [employer]. Previous HF Case Manager states patient may benefit from [facility] RN and HF Paramedicine Team. Case Manager will continue to follow for needs.      Expected Discharge Plan: Home w Home Health Services Barriers to Discharge: Continued Medical Work up  Expected Discharge Plan and Services   Discharge Planning Services: CM Consult   Living arrangements for the past 2 months: Single Family Home   Social Determinants of Health (SDOH) Interventions SDOH Screenings   Food Insecurity: No Food Insecurity (01/21/2023)  Housing: Low Risk  (01/21/2023)  Transportation Needs: No Transportation Needs (01/21/2023)  Utilities: Not At Risk (01/21/2023)  Tobacco Use: Medium Risk (02/18/2023)    Readmission Risk Interventions    01/21/2023    4:21 PM  Readmission Risk Prevention Plan  Transportation Screening Complete  Medication Review (RN Care Manager) Complete  HRI or Home Care Consult Complete  Palliative Care Screening Not Applicable  Skilled Nursing Facility Not Applicable

## 2023-02-18 NOTE — Progress Notes (Addendum)
Patient received topical tranexamic acid  to left nare via saturated gauze, as ordered for on-going nose bleed, at 1946. At 2030, slow bleeding resumed from nare. Per Pharmacy, remainder of TXA vial ( ) applied to affected nare via saturated gauze. Dr. Arville Care, hospitalist, notified of ongoing nose bleed. Clotting factor labs ordered and sent.   At 2130, this RN was called back to beside by daughter in room for ongoing bleeding through left nare. Cardiology paged for ongoing issue.

## 2023-02-18 NOTE — Interval H&P Note (Signed)
History and Physical Interval Note:  02/18/2023 9:06 AM  [name]  has presented today for surgery, with the diagnosis of heart failure.  The various methods of treatment have been discussed with the patient and family. After consideration of risks, benefits and other options for treatment, the patient has consented to  Procedure(s): RIGHT HEART CATH (N/A) as a surgical intervention.  The patient's history has been reviewed, patient examined, no change in status, stable for surgery.  I have reviewed the patient's chart and labs.  Questions were answered to the patient's satisfaction.     Shaheen Mende [employer]

## 2023-02-18 NOTE — Progress Notes (Signed)
Call returned from Cardiology- Per Dr. Hetty Ely, OK to Hold 2200 eliquis tablet overnight in respect to on-going nose bleeding. Recommended to this RN to continue holding pressure and monitor site and amount of bleeding. Will page again if symptoms worsen.

## 2023-02-19 ENCOUNTER — Other Ambulatory Visit (HOSPITAL_COMMUNITY): Payer: Medicare HMO

## 2023-02-19 DIAGNOSIS — I5023 Acute on chronic systolic (congestive) heart failure: Secondary | ICD-10-CM | POA: Diagnosis not present

## 2023-02-19 LAB — BASIC METABOLIC PANEL
Anion gap: 13 (ref 5–15)
BUN: 62 mg/dL — ABNORMAL HIGH (ref 8–23)
CO2: 31 mmol/L (ref 22–32)
Calcium: 9 mg/dL (ref 8.9–10.3)
Chloride: 88 mmol/L — ABNORMAL LOW (ref 98–111)
Creatinine, Ser: 2.44 mg/dL — ABNORMAL HIGH (ref 0.61–1.24)
GFR, Estimated: 27 mL/min — ABNORMAL LOW (ref >60)
Glucose, Bld: 178 mg/dL — ABNORMAL HIGH (ref 70–99)
Potassium: 3.9 mmol/L (ref 3.5–5.1)
Sodium: 132 mmol/L — ABNORMAL LOW (ref 135–145)

## 2023-02-19 LAB — CBC
HCT: 34.3 % — ABNORMAL LOW (ref 39.0–52.0)
HCT: 36 % — ABNORMAL LOW (ref 39.0–52.0)
Hemoglobin: 10.9 g/dL — ABNORMAL LOW (ref 13.0–17.0)
Hemoglobin: 11.5 g/dL — ABNORMAL LOW (ref 13.0–17.0)
MCH: 27.2 pg (ref 26.0–34.0)
MCH: 27.4 pg (ref 26.0–34.0)
MCHC: 31.8 g/dL (ref 30.0–36.0)
MCHC: 31.9 g/dL (ref 30.0–36.0)
MCV: 85.5 fL (ref 80.0–100.0)
MCV: 85.9 fL (ref 80.0–100.0)
Platelets: 139 10*3/uL — ABNORMAL LOW (ref 150–400)
Platelets: 159 10*3/uL (ref 150–400)
RBC: 4.01 MIL/uL — ABNORMAL LOW (ref 4.22–5.81)
RBC: 4.19 MIL/uL — ABNORMAL LOW (ref 4.22–5.81)
RDW: 18.4 % — ABNORMAL HIGH (ref 11.5–15.5)
RDW: 18.2 % — ABNORMAL HIGH (ref 11.5–15.5)
WBC: 4.8 10*3/uL (ref 4.0–10.5)
WBC: 5.2 10*3/uL (ref 4.0–10.5)
nRBC: 0 % (ref 0.0–0.2)
nRBC: 0 % (ref 0.0–0.2)

## 2023-02-19 LAB — GLUCOSE, CAPILLARY
Glucose-Capillary: 148 mg/dL — ABNORMAL HIGH (ref 70–99)
Glucose-Capillary: 377 mg/dL — ABNORMAL HIGH (ref 70–99)
Glucose-Capillary: 129 mg/dL — ABNORMAL HIGH (ref 70–99)
Glucose-Capillary: 216 mg/dL — ABNORMAL HIGH (ref 70–99)

## 2023-02-19 LAB — COOXEMETRY PANEL
Carboxyhemoglobin: 2 % — ABNORMAL HIGH (ref 0.5–1.5)
Methemoglobin: <0.7 % (ref 0.0–1.5)
O2 Saturation: 71.1 %
Total hemoglobin: 11.2 g/dL — ABNORMAL LOW (ref 12.0–16.0)

## 2023-02-19 MED ORDER — POTASSIUM CHLORIDE CRYS ER 20 MEQ PO TBCR
40.0000 meq | EXTENDED_RELEASE_TABLET | Freq: Once | ORAL | Status: AC
  Administered 2023-02-19: 40 meq via ORAL
  Filled 2023-02-19: qty 2

## 2023-02-19 MED ORDER — INSULIN ASPART 100 UNIT/ML IJ SOLN
3.0000 [IU] | Freq: Three times a day (TID) | INTRAMUSCULAR | Status: DC
  Administered 2023-02-19 – 2023-02-20 (×3): 3 [IU] via SUBCUTANEOUS

## 2023-02-19 MED ORDER — HYDRALAZINE HCL 10 MG PO TABS
10.0000 mg | ORAL_TABLET | Freq: Three times a day (TID) | ORAL | Status: DC
  Administered 2023-02-19 – 2023-02-20 (×3): 10 mg via ORAL
  Filled 2023-02-19 (×3): qty 1

## 2023-02-19 MED ORDER — APIXABAN 5 MG PO TABS: 5.0000 mg | ORAL_TABLET | Freq: Two times a day (BID) | ORAL | Status: DC

## 2023-02-19 MED ORDER — CEPHALEXIN 250 MG PO CAPS
250.0000 mg | ORAL_CAPSULE | Freq: Three times a day (TID) | ORAL | Status: DC
  Administered 2023-02-19 – 2023-02-23 (×12): 250 mg via ORAL
  Filled 2023-02-19 (×13): qty 1

## 2023-02-19 MED ORDER — ISOSORBIDE MONONITRATE ER 30 MG PO TB24
15.0000 mg | ORAL_TABLET | Freq: Every day | ORAL | Status: DC
  Administered 2023-02-19 – 2023-02-20 (×2): 15 mg via ORAL
  Filled 2023-02-19 (×2): qty 1

## 2023-02-19 NOTE — Consult Note (Signed)
Reason for Consult: Left-sided epistaxis  HPI:  [name] is an 77 y.o. male who was recently admitted for treatment of his congestive heart failure.  The patient has a history of chronic systolic CHF, T2DM, CKD, COPD chronic hypoxemic respiratory failure and hypertension.  He was recently started on Eliquis.  According to the patient, he started experiencing left-sided epistaxis yesterday.  He continues to have bleeding throughout the night.  He was treated with Afrin and Tranexamic.  His Eliquis is currently on hold.  Past Medical History:  Diagnosis Date   Acute hypoxemic respiratory failure 01/20/2023   Acute kidney injury superimposed on chronic kidney disease 10/19/2022   Acute on chronic systolic CHF (congestive heart failure) 01/21/2023   Arthritis    CAD (coronary artery disease)    Carotid artery occlusion    Diabetes mellitus    fasting 100-120s   Dizziness    Dysrhythmia    skips beats   GERD (gastroesophageal reflux disease)    History of kidney stones    Hyperlipidemia    Hypertension    IHD (ischemic heart disease)    Prior CABG in 2003   Normal nuclear stress test 03/2011   Peripheral vascular disease    Prostate cancer    Torn rotator cuff     Past Surgical History:  Procedure Laterality Date   ABDOMINAL AORTAGRAM N/A 12/21/2013   Procedure: ABDOMINAL Ronny Flurry;  Surgeon: Sherren Kerns, MD;  Location: [facility] CATH LAB;  Service: Cardiovascular;  Laterality: N/A;   ABDOMINAL AORTOGRAM N/A 11/13/2017   Procedure: ABDOMINAL AORTOGRAM;  Surgeon: Sherren Kerns, MD;  Location: [facility] INVASIVE CV LAB;  Service: Cardiovascular;  Laterality: N/A;   APPENDECTOMY     ARCH AORTOGRAM  08/26/2013   Procedure: ARCH AORTOGRAM;  Surgeon: Sherren Kerns, MD;  Location: [facility] CATH LAB;  Service: Cardiovascular;;   CARDIAC CATHETERIZATION  05/03/2002   EF 40-45%   CARDIOVASCULAR STRESS TEST  08/10/2006   EF 65%   CARDIOVERSION N/A 10/22/2022   Procedure: CARDIOVERSION;  Surgeon:  Dolores Patty, MD;  Location: [facility] ENDOSCOPY;  Service: Cardiovascular;  Laterality: N/A;   CAROTID ENDARTERECTOMY     CAROTID STENT INSERTION Left 08/26/2013   Procedure: CAROTID STENT INSERTION;  Surgeon: Sherren Kerns, MD;  Location: [facility] CATH LAB;  Service: Cardiovascular;  Laterality: Left;  internal carotid   CORONARY ARTERY BYPASS GRAFT  04/2002   DOPPLER ECHOCARDIOGRAPHY  08/12/2002   EF 80-85%   ENDARTERECTOMY Left 07/20/2013   Procedure: ATTEMPTED ENDARTERECTOMY CAROTID;  Surgeon: Fransisco Hertz, MD;  Location: [facility] [place] OR;  Service: Vascular;  Laterality: Left;   HERNIA REPAIR     LOWER EXTREMITY ANGIOGRAPHY  11/13/2017   Procedure: Lower Extremity Angiography;  Surgeon: Sherren Kerns, MD;  Location: [facility] INVASIVE CV LAB;  Service: Cardiovascular;;   PERIPHERAL VASCULAR INTERVENTION Right 11/13/2017   Procedure: PERIPHERAL VASCULAR INTERVENTION;  Surgeon: Sherren Kerns, MD;  Location: [facility] INVASIVE CV LAB;  Service: Cardiovascular;  Laterality: Right;  superficial femoral   RIGHT HEART CATH N/A 09/23/2022   Procedure: RIGHT HEART CATH;  Surgeon: Dolores Patty, MD;  Location: [facility] INVASIVE CV LAB;  Service: Cardiovascular;  Laterality: N/A;   RIGHT HEART CATH N/A 02/18/2023   Procedure: RIGHT HEART CATH;  Surgeon: Laurey Morale, MD;  Location: [facility] INVASIVE CV LAB;  Service: Cardiovascular;  Laterality: N/A;   SHOULDER SURGERY     TEE WITHOUT CARDIOVERSION N/A 10/22/2022   Procedure: TRANSESOPHAGEAL ECHOCARDIOGRAM (TEE);  Surgeon:  Bensimhon, Bevelyn Buckles, MD;  Location: [facility] ENDOSCOPY;  Service: Cardiovascular;  Laterality: N/A;    Family History  Problem Relation Age of Onset   Heart attack Mother    Diabetes Mother    Heart disease Mother    Hypertension Mother    Heart attack Father    Heart disease Father    Hypertension Father    Diabetes Brother    Diabetes Sister    Diabetes Daughter    Heart disease Daughter    Hypertension Daughter    Diabetes Sister    Breast  cancer Neg Hx    Prostate cancer Neg Hx    Colon cancer Neg Hx    Pancreatic cancer Neg Hx     Social History:  reports that he quit smoking about 21 years ago. His smoking use included cigarettes. He has a 20.00 pack-year smoking history. He has never been exposed to tobacco smoke. He has never used smokeless tobacco. He reports that he does not drink alcohol and does not use drugs.  Allergies:  Allergies  Allergen Reactions   Aldactone [Spironolactone] Other (See Comments)    Swollen breast   Lipitor [Atorvastatin Calcium] Other (See Comments)    Muscle ache   Codeine Rash   Sulfa Drugs Cross Reactors Other (See Comments)    "raw spots"   Sulfamethoxazole-Trimethoprim Rash    Other reaction(s): Rash    Prior to Admission medications   Medication Sig Start Date End Date Taking? Authorizing Provider  albuterol (VENTOLIN HFA) 108 (90 Base) MCG/ACT inhaler Inhale 2 puffs into the lungs every 6 (six) hours as needed for wheezing or shortness of breath. 07/02/21  Yes [provider]  amiodarone (PACERONE) 200 MG tablet Take 0.5 tablets (100 mg total) by mouth daily. 11/07/22  Yes Clegg, [name] D, NP  amLODipine (NORVASC) 10 MG tablet Take 1 tablet (10 mg total) by mouth daily. 01/02/23  Yes Bensimhon, Bevelyn Buckles, MD  apixaban (ELIQUIS) 5 MG TABS tablet Take 1 tablet (5 mg total) by mouth 2 (two) times daily. 10/07/22  Yes Milford, Anderson Malta, FNP  Budeson-Glycopyrrol-Formoterol (BREZTRI AEROSPHERE) 160-9-4.8 MCG/ACT AERO Inhale 2 puffs into the lungs in the morning and at bedtime. 01/26/23  Yes Omar Person, MD  Cholecalciferol (VITAMIN D-3) 125 MCG (5000 UT) TABS Take 5,000 Units by mouth daily.   Yes [provider]  cyanocobalamin (VITAMIN B12) 1000 MCG tablet Take 1,000 mcg by mouth daily.   Yes [provider]  dapagliflozin propanediol (FARXIGA) 10 MG TABS tablet Take 1 tablet (10 mg total) by mouth daily before breakfast. 02/02/23  Yes Milford, Anderson Malta, FNP   furosemide (LASIX) 40 MG tablet Take 0.5 tablets (20 mg total) by mouth daily. Patient taking differently: Take 20 mg by mouth 2 (two) times daily. 20 mg every morning and 20 mg every evening 01/24/23  Yes Zannie Cove, MD  hydrALAZINE (APRESOLINE) 50 MG tablet Take 0.5 tablets (25 mg total) by mouth 3 (three) times daily. 01/24/23  Yes Zannie Cove, MD  insulin NPH Human (NOVOLIN N) 100 UNIT/ML injection Inject 35-40 Units into the skin See admin instructions. Inject 40 units into the skin in the morning and 35 units at night 09/08/19  Yes [provider]  insulin regular (NOVOLIN R) 100 units/mL injection Inject 6-15 Units into the skin 3 (three) times daily before meals. Per sliding scale 09/08/19  Yes [provider]  isosorbide mononitrate (IMDUR) 30 MG 24 hr tablet Take 1 tablet (30 mg total)  by mouth daily. 12/04/22  Yes Bensimhon, Bevelyn Buckles, MD  LINZESS 72 MCG capsule Take 72 mcg by mouth every morning. 02/09/23  Yes [provider]  pantoprazole (PROTONIX) 40 MG tablet Take 40 mg by mouth daily.   Yes [provider]  potassium chloride (KLOR-CON M) 10 MEQ tablet Take 1 tablet (10 mEq total) by mouth 2 (two) times daily. Patient taking differently: Take 10 mEq by mouth daily. 02/09/23  Yes Milford, Anderson Malta, FNP  potassium chloride (KLOR-CON) 10 MEQ tablet Take 10 mEq by mouth daily. 02/09/23  Yes [provider]  rosuvastatin (CRESTOR) 20 MG tablet Take 1 tablet by mouth once daily Patient taking differently: Take 20 mg by mouth at bedtime. 08/18/22  Yes Nahser, Deloris Ping, MD  nitroGLYCERIN (NITROSTAT) 0.4 MG SL tablet Place 1 tablet (0.4 mg total) under the tongue every 5 (five) minutes as needed for chest pain. 08/31/20   Nahser, Deloris Ping, MD    Medications: I have reviewed the patient's current medications. Scheduled:  Chlorhexidine Gluconate Cloth  6 each Topical Daily   fluticasone furoate-vilanterol  1 puff Inhalation Daily   hydrALAZINE   10 mg Oral Q8H   insulin aspart  0-5 Units Subcutaneous QHS   insulin aspart  0-9 Units Subcutaneous TID WC   insulin aspart  3 Units Subcutaneous TID WC   insulin glargine-yfgn  24 Units Subcutaneous Daily   isosorbide mononitrate  15 mg Oral Daily   linaclotide  72 mcg Oral q morning   pantoprazole  40 mg Oral Daily   rosuvastatin  20 mg Oral QHS   sodium chloride flush  10-40 mL Intracatheter Q12H   sodium chloride flush  3 mL Intravenous Q12H   umeclidinium bromide  1 puff Inhalation Daily   Continuous:  amiodarone 30 mg/hr (02/19/23 0800)   furosemide (LASIX) 200 mg in dextrose 5 % 100 mL (2 mg/mL) infusion 12 mg/hr (02/19/23 0800)   milrinone 0.25 mcg/kg/min (02/19/23 0800)   ZOX:[id number], ALPRAZolam, ipratropium-albuterol, mouth rinse, phenol, sodium chloride flush, traZODone  Results for orders placed or performed during the hospital encounter of 02/10/23 (from the past 48 hour(s))  Glucose, capillary     Status: Abnormal   Collection Time: 02/17/23 10:38 AM  Result Value Ref Range   Glucose-Capillary 211 (H) 70 - 99 mg/dL    Comment: Glucose reference range applies only to samples taken after fasting for at least 8 hours.  Glucose, capillary     Status: Abnormal   Collection Time: 02/17/23 11:46 AM  Result Value Ref Range   Glucose-Capillary 169 (H) 70 - 99 mg/dL    Comment: Glucose reference range applies only to samples taken after fasting for at least 8 hours.  Glucose, capillary     Status: Abnormal   Collection Time: 02/17/23  4:42 PM  Result Value Ref Range   Glucose-Capillary 209 (H) 70 - 99 mg/dL    Comment: Glucose reference range applies only to samples taken after fasting for at least 8 hours.  Glucose, capillary     Status: Abnormal   Collection Time: 02/17/23  9:49 PM  Result Value Ref Range   Glucose-Capillary 232 (H) 70 - 99 mg/dL    Comment: Glucose reference range applies only to samples taken after fasting for at least 8 hours.  Cooxemetry  Panel (carboxy, met, total hgb, O2 sat)     Status: Abnormal   Collection Time: 02/18/23  5:12 AM  Result Value Ref Range   Total hemoglobin 10.9 (  L) 12.0 - 16.0 g/dL   O2 Saturation 69 %   Carboxyhemoglobin 1.8 (H) 0.5 - 1.5 %   Methemoglobin 0.8 0.0 - 1.5 %    Comment: Performed at [facility] Lab, [address], [place], [place] [address]  Basic metabolic panel     Status: Abnormal   Collection Time: 02/18/23  5:12 AM  Result Value Ref Range   Sodium 128 (L) 135 - 145 mmol/L   Potassium 4.0 3.5 - 5.1 mmol/L   Chloride 88 (L) 98 - 111 mmol/L   CO2 28 22 - 32 mmol/L   Glucose, Bld 193 (H) 70 - 99 mg/dL    Comment: Glucose reference range applies only to samples taken after fasting for at least 8 hours.   BUN 61 (H) 8 - 23 mg/dL   Creatinine, Ser 6.04 (H) 0.61 - 1.24 mg/dL   Calcium 8.5 (L) 8.9 - 10.3 mg/dL   GFR, Estimated 25 (L) >60 mL/min    Comment: (NOTE) Calculated using the CKD-EPI Creatinine Equation (2021)    Anion gap 12 5 - 15    Comment: Performed at [facility], [address], [place], [place] [address]  Glucose, capillary     Status: Abnormal   Collection Time: 02/18/23  6:52 AM  Result Value Ref Range   Glucose-Capillary 188 (H) 70 - 99 mg/dL    Comment: Glucose reference range applies only to samples taken after fasting for at least 8 hours.  POCT I-Stat EG7     Status: Abnormal   Collection Time: 02/18/23  9:30 AM  Result Value Ref Range   pH, Ven 7.476 (H) 7.25 - 7.43   pCO2, Ven 41.2 (L) 44 - 60 mmHg   pO2, Ven 25 (LL) 32 - 45 mmHg   Bicarbonate 30.4 (H) 20.0 - 28.0 mmol/L   TCO2 32 22 - 32 mmol/L   O2 Saturation 51 %   Acid-Base Excess 6.0 (H) 0.0 - 2.0 mmol/L   Sodium 129 (L) 135 - 145 mmol/L   Potassium 4.2 3.5 - 5.1 mmol/L   Calcium, Ion 1.15 1.15 - 1.40 mmol/L   HCT 36.0 (L) 39.0 - 52.0 %   Hemoglobin 12.2 (L) 13.0 - 17.0 g/dL   Sample type VENOUS   POCT I-Stat EG7     Status: Abnormal   Collection Time: 02/18/23  9:30 AM  Result  Value Ref Range   pH, Ven 7.479 (H) 7.25 - 7.43   pCO2, Ven 40.1 (L) 44 - 60 mmHg   pO2, Ven 25 (LL) 32 - 45 mmHg   Bicarbonate 29.8 (H) 20.0 - 28.0 mmol/L   TCO2 31 22 - 32 mmol/L   O2 Saturation 50 %   Acid-Base Excess 6.0 (H) 0.0 - 2.0 mmol/L   Sodium 129 (L) 135 - 145 mmol/L   Potassium 4.1 3.5 - 5.1 mmol/L   Calcium, Ion 1.16 1.15 - 1.40 mmol/L   HCT 36.0 (L) 39.0 - 52.0 %   Hemoglobin 12.2 (L) 13.0 - 17.0 g/dL   Sample type VENOUS   Glucose, capillary     Status: Abnormal   Collection Time: 02/18/23 11:16 AM  Result Value Ref Range   Glucose-Capillary 195 (H) 70 - 99 mg/dL    Comment: Glucose reference range applies only to samples taken after fasting for at least 8 hours.  Glucose, capillary     Status: Abnormal   Collection Time: 02/18/23  3:56 PM  Result Value Ref Range   Glucose-Capillary  342 (H) 70 - 99 mg/dL    Comment: Glucose reference range applies only to samples taken after fasting for at least 8 hours.  Protime-INR     Status: Abnormal   Collection Time: 02/18/23  8:57 PM  Result Value Ref Range   Prothrombin Time 21.7 (H) 11.4 - 15.2 seconds   INR 1.9 (H) 0.8 - 1.2    Comment: (NOTE) INR goal varies based on device and disease states. Performed at [facility], [address], [place], [place] [address]   APTT     Status: Abnormal   Collection Time: 02/18/23  8:57 PM  Result Value Ref Range   aPTT 58 (H) 24 - 36 seconds    Comment:        IF BASELINE aPTT IS ELEVATED, SUGGEST PATIENT RISK ASSESSMENT BE USED TO DETERMINE APPROPRIATE ANTICOAGULANT THERAPY. Performed at [facility], [address] [place], [place] [address]   Glucose, capillary     Status: Abnormal   Collection Time: 02/18/23 10:02 PM  Result Value Ref Range   Glucose-Capillary 297 (H) 70 - 99 mg/dL    Comment: Glucose reference range applies only to samples taken after fasting for at least 8 hours.  Cooxemetry Panel (carboxy, met, total hgb, O2 sat)     Status: Abnormal    Collection Time: 02/19/23  4:43 AM  Result Value Ref Range   Total hemoglobin 11.2 (L) 12.0 - 16.0 g/dL   O2 Saturation 09.8 %   Carboxyhemoglobin 2.0 (H) 0.5 - 1.5 %   Methemoglobin <0.7 0.0 - 1.5 %    Comment: Performed at [facility], [address], [place], [place] [address]  Basic metabolic panel     Status: Abnormal   Collection Time: 02/19/23  4:43 AM  Result Value Ref Range   Sodium 132 (L) 135 - 145 mmol/L   Potassium 3.9 3.5 - 5.1 mmol/L   Chloride 88 (L) 98 - 111 mmol/L   CO2 31 22 - 32 mmol/L   Glucose, Bld 178 (H) 70 - 99 mg/dL    Comment: Glucose reference range applies only to samples taken after fasting for at least 8 hours.   BUN 62 (H) 8 - 23 mg/dL   Creatinine, Ser 7.82 (H) 0.61 - 1.24 mg/dL   Calcium 9.0 8.9 - 95.6 mg/dL   GFR, Estimated 27 (L) >60 mL/min    Comment: (NOTE) Calculated using the CKD-EPI Creatinine Equation (2021)    Anion gap 13 5 - 15    Comment: Performed at [facility] Lab, [address], [place], [place] [address]  CBC     Status: Abnormal   Collection Time: 02/19/23  4:43 AM  Result Value Ref Range   WBC 4.8 4.0 - 10.5 K/uL   RBC 4.01 (L) 4.22 - 5.81 MIL/uL   Hemoglobin 10.9 (L) 13.0 - 17.0 g/dL   HCT 65.7 (L) 84.6 - 96.2 %   MCV 85.5 80.0 - 100.0 fL   MCH 27.2 26.0 - 34.0 pg   MCHC 31.8 30.0 - 36.0 g/dL   RDW 95.2 (H) 84.1 - 32.4 %   Platelets 139 (L) 150 - 400 K/uL   nRBC 0.0 0.0 - 0.2 %    Comment: Performed at [facility], [address] [place], [place] [address]  Glucose, capillary     Status: Abnormal   Collection Time: 02/19/23  6:21 AM  Result Value Ref Range   Glucose-Capillary 148 (H)  70 - 99 mg/dL    Comment: Glucose reference range applies only to samples taken after fasting for at least 8 hours.    CARDIAC CATHETERIZATION  Result Date: 02/18/2023 1. Elevated filling pressures. 2. Moderate pulmonary venous hypertension. 3. Stable CO on milrinone.    Blood pressure (!) 142/72, pulse 80,  temperature 98.7 F (37.1 C), temperature source Oral, resp. rate (!) 23, height  (1.778 m), weight 78.8 kg, SpO2 94 %. General appearance: alert and cooperative Head: Normocephalic, without obvious abnormality, atraumatic Eyes: Pupils are equal, round, reactive to light. Extraocular motion is intact.  Ears: Examination of the ears shows normal auricles and external auditory canals bilaterally.  Nose: Acute left-sided epistaxis.  Bleeding is noted both anteriorly and posteriorly. Face: Facial examination shows no asymmetry. Palpation of the face elicit no significant tenderness.  Mouth: Oral cavity examination shows no mucosal lacerations. No significant trismus is noted.  Neck: Palpation of the neck reveals no lymphadenopathy or mass. The trachea is midline.   Procedure: Anterior/Posterior nasal packing for control of left  epistaxis. Anesthesia: Topical xylocaine and Afrin Description: The patient is placed upright in her hospital bed.  Blood clot is suctions from the left nasal cavity. Bleeding is noted from anterior and posterior left nasal cavity.Topical xylocaine and Afrin are applied. A 10cm Merocel packing is placed in the left nasal cavity with good hemostasis.  The patient tolerated the procedure well.   Assessment/Plan: Left anterior/posterior epistaxis. -Bleeding controlled with the use of a 10 cm Merisel packing. -Hold Eliquis for today. -Applied nasal drip pad as needed. -Will leave nasal packing in place until Monday. -Gram + antibiotic coverage (e.g. Keflex or clindamycin) while the packing is in place.  Jericho Alcorn W Aubrea Meixner 02/19/2023, 10:31 AM

## 2023-02-19 NOTE — Progress Notes (Signed)
PROGRESS NOTE    [name]  ZOX:[id number] DOB: 1946/01/12 DOA: 02/10/2023 PCP: Gaspar Garbe, MD  77/M w chronic systolic CHF, T2DM, CKD, COPD chronic hypoxemic respiratory failure and hypertension who presented with lower extremity edema. Recent hospitalization 3/26-3/30 for CHF. Cardiology outpatient follow up he had gain 5 lbs from the time of his discharge. At home he continue to have worsening edema, and weight gain for 9 lbs more. In the ED, + edema and rales, labs w/ cr 2,32, BNP 2,508, CXR noted cardiomegaly, bilateral hilar vascular congestion, with bilateral interstitial infiltrates, small bilateral pleural effusions, -Admitted, started on diuretics -4/19 -started on dobutamine infusion.  -4/20 developed VT TX to ICU -4/21: CVP 14, diuresing, continued amio,  Started milrinone -4/23 improvement in volume status  -4/24: RHC with elevated filling pressures, wedge of 24, cardiac output 4.7 on milrinone -4/24: Recurrent epistaxis, treated with Afrin and tranexamic acid, Eliquis held -4/26: Persistent epistaxis ENT consulted anterior/posterior nasal packing performed by Dr. Suszanne Conners   Subjective: -Breathing is fair, persistent epistaxis  Assessment and Plan:  Acute on chronic systolic CHF (congestive heart failure) Acute hypoxic respiratory failure -Echo with EF 35 to 40%, no LVH, RV systolic function preserved,  -Admitted with low output heart failure.  -CHF team following, off Dobutamine, remains on milrinone, Lasix gtt. -Right heart cath with elevated filling pressures,  -Attempt to wean down O2, on 2 L at baseline  AKI on CKD3b -baseline creat around 2.2 -now 2.5, monitor  Persistent Atrial flutter -Rate improving, on IV amiodarone -Holding apixaban for epistaxis  Persistent epistaxis -Since 4/24 evening, attempted Afrin and TXA with temporary relief only, recurrence of epistaxis overnight -ENT consulted, completed anterior/posterior nasal packing by Dr. Rulon Sera  this morning, appreciate input, recommended to hold Eliquis for 1 day, leave nasal packing in place till Monday and gram-positive antibiotic coverage  CAD CABG -ischemic workup deferred w/ CKD   PAD - s/p intervention 2015 Rt SFA and Rt politeal artery vascular stents, continue statin   GERD (gastroesophageal reflux disease) -Continue protonix 40 mg daily.  Type 2 diabetes mellitus with hyperlipidemia -Uncontrolled T2DM with hyperglycemia.  -continue Glargine, increased dose  COPD (chronic obstructive pulmonary disease) -On 2 L home O2 -Continue current medical management.   DNR (do not resuscitate)  DVT prophylaxis: SCDs Code Status: DNR Family Communication: Daughter at bedside yesterday Disposition Plan: Home likely 48 hours  Consultants: CHF team   Procedures:   Antimicrobials:    Objective: Vitals:   02/19/23 0804 02/19/23 0923 02/19/23 1000 02/19/23 1129  BP:  (!) 149/72 (!) 144/68   Pulse:  75 70   Resp:  (!) 22 17   Temp:    (!) 96.1 F (35.6 C)  TempSrc:    Axillary  SpO2: 94% 97% 100%   Weight:      Height:        Intake/Output Summary (Last 24 hours) at 02/19/2023 1143 Last data filed at 02/19/2023 0800 Gross per 24 hour  Intake 923.91 ml  Output 3440 ml  Net -2516.09 ml   Filed Weights   02/17/23 0817 02/18/23 0554 02/19/23 0500  Weight: 81 kg 80.4 kg 78.8 kg    Examination:  General exam: Pleasant elderly male sitting up in the recliner, AAOx3 HEENT: No JVD, left nostril with bloodsoaked gauze CVS: S1-S2, irregular rhythm Lungs: Decreased breath sounds at the bases, poor air movement Abdomen: Soft, nontender, bowel sounds present Extremities: Trace edema  Skin: No rashes Psychiatry:  Mood & affect appropriate.  Data Reviewed:   CBC: Recent Labs  Lab 02/13/23 0059 02/14/23 0912 02/15/23 0445 02/18/23 0930 02/19/23 0443  WBC 4.8 4.1 5.3  --  4.8  HGB 12.1* 11.6* 11.8* 12.2*  12.2* 10.9*  HCT 40.5 36.4* 36.3* 36.0*   36.0* 34.3*  MCV 90.6 85.8 84.6  --  85.5  PLT 149* 153 170  --  139*   Basic Metabolic Panel: Recent Labs  Lab 02/14/23 0912 02/14/23 1205 02/15/23 0445 02/16/23 0413 02/17/23 0358 02/18/23 0512 02/18/23 0930 02/19/23 0443  NA 130* 132* 130* 130* 127* 128* 129*  129* 132*  K 3.6 3.4* 3.6 3.9 3.3* 4.0 4.1  4.2 3.9  CL 89* 87* 88* 86* 87* 88*  --  88*  CO2 27 28 27 28 26 28   --  31  GLUCOSE 361* 336* 261* 226* 268* 193*  --  178*  BUN 52* 54* 60* 66* 64* 61*  --  62*  CREATININE 2.66* 2.67* 2.80* 3.08* 2.75* 2.59*  --  2.44*  CALCIUM 9.1 8.8* 8.6* 9.0 8.3* 8.5*  --  9.0  MG 2.2 2.0 2.1 2.1 2.2  --   --   --    GFR: Estimated Creatinine Clearance: 27 mL/min (A) (by C-G formula based on SCr of 2.44 mg/dL (H)). Liver Function Tests: No results for input(s): "AST", "ALT", "ALKPHOS", "BILITOT", "PROT", "ALBUMIN" in the last 168 hours.  No results for input(s): "LIPASE", "AMYLASE" in the last 168 hours. No results for input(s): "AMMONIA" in the last 168 hours. Coagulation Profile: Recent Labs  Lab 02/18/23 2057  INR 1.9*   Cardiac Enzymes: No results for input(s): "CKTOTAL", "CKMB", "CKMBINDEX", "TROPONINI" in the last 168 hours. BNP (last 3 results) No results for input(s): "PROBNP" in the last 8760 hours. HbA1C: No results for input(s): "HGBA1C" in the last 72 hours. CBG: Recent Labs  Lab 02/18/23 1116 02/18/23 1556 02/18/23 2202 02/19/23 0621 02/19/23 1131  GLUCAP 195* 342* 297* 148* 377*   Lipid Profile: No results for input(s): "CHOL", "HDL", "LDLCALC", "TRIG", "CHOLHDL", "LDLDIRECT" in the last 72 hours. Thyroid Function Tests: No results for input(s): "TSH", "T4TOTAL", "FREET4", "T3FREE", "THYROIDAB" in the last 72 hours. Anemia Panel: No results for input(s): "VITAMINB12", "FOLATE", "FERRITIN", "TIBC", "IRON", "RETICCTPCT" in the last 72 hours. Urine analysis:    Component Value Date/Time   COLORURINE STRAW (A) 10/19/2022 0628   APPEARANCEUR CLEAR  10/19/2022 0628   LABSPEC 1.009 10/19/2022 0628   PHURINE 5.0 10/19/2022 0628   GLUCOSEU >=500 (A) 10/19/2022 0628   HGBUR NEGATIVE 10/19/2022 0628   BILIRUBINUR NEGATIVE 10/19/2022 0628   KETONESUR NEGATIVE 10/19/2022 0628   PROTEINUR NEGATIVE 10/19/2022 0628   UROBILINOGEN 1.0 07/19/2013 1223   NITRITE NEGATIVE 10/19/2022 0628   LEUKOCYTESUR NEGATIVE 10/19/2022 0628   Sepsis Labs: @LABRCNTIP (procalcitonin:4,lacticidven:4)  ) Recent Results (from the past 240 hour(s))  MRSA Next Gen by PCR, Nasal     Status: None   Collection Time: 02/14/23  2:46 PM   Specimen: Nasal Mucosa; Nasal Swab  Result Value Ref Range Status   MRSA by PCR Next Gen NOT DETECTED NOT DETECTED Final    Comment: (NOTE) The GeneXpert MRSA Assay (FDA approved for NASAL specimens only), is one component of a comprehensive MRSA colonization surveillance program. It is not intended to diagnose MRSA infection nor to guide or monitor treatment for MRSA infections. Test performance is not FDA approved in patients less than 68 years old. Performed at [facility], [address] [place], [place] [address]  Radiology Studies: CARDIAC CATHETERIZATION  Result Date: 02/18/2023 1. Elevated filling pressures. 2. Moderate pulmonary venous hypertension. 3. Stable CO on milrinone.     Scheduled Meds:  Chlorhexidine Gluconate Cloth  6 each Topical Daily   fluticasone furoate-vilanterol  1 puff Inhalation Daily   hydrALAZINE  10 mg Oral Q8H   insulin aspart  0-5 Units Subcutaneous QHS   insulin aspart  0-9 Units Subcutaneous TID WC   insulin aspart  3 Units Subcutaneous TID WC   insulin glargine-yfgn  24 Units Subcutaneous Daily   isosorbide mononitrate  15 mg Oral Daily   linaclotide  72 mcg Oral q morning   pantoprazole  40 mg Oral Daily   rosuvastatin  20 mg Oral QHS   sodium chloride flush  10-40 mL Intracatheter Q12H   sodium chloride flush  3 mL Intravenous Q12H   umeclidinium bromide  1 puff  Inhalation Daily   Continuous Infusions:  amiodarone 30 mg/hr (02/19/23 0800)   furosemide (LASIX) 200 mg in dextrose 5 % 100 mL (2 mg/mL) infusion 12 mg/hr (02/19/23 0800)   milrinone 0.25 mcg/kg/min (02/19/23 0800)     LOS: 8 days    Time spent:    Zannie Cove, MD [facility]   02/19/2023, 11:43 AM

## 2023-02-19 NOTE — Progress Notes (Addendum)
Patient ID: [name], male   DOB: 08/27/1946, 77 y.o.   MRN: [id number]     Advanced Heart Failure Rounding Note  PCP-Cardiologist: Kristeen Miss, MD   Subjective:    - 4/20 while on dobutamine patient went into atrial flutter with RVR, became hypotensive.  Decision made to start amiodarone and transferred to CVICU. - 4/22 CO-OX marginal. Milrinone increased to 0.25 mcg. Diuresed with IV lasix. - 4/24 Post cath started on lasix drip at 12 mg per hour.   Remains on milrinone 0.25 mcg.+ lasix drip. Diuresis improved -2.4 liters.  CO-OX stable. 71%.   On amio 30 mg per hour. Eliquis on hold with nose bleed.  Tranexamic used + afrin   Creatinine 2.8 > 3.08 > 2.75 > 2.59>2.4  4/24 RHC Procedural Findings (milrinone 0.25): Hemodynamics (mmHg) RA mean 12 RV 55/16 PA 57/22, mean 34 PCWP mean 24, v-waves to 35Oxygen saturations: PA 51% AO 86% Cardiac Output (Fick) 4.71  Cardiac Index (Fick) 2.37 PVR 2.1 WU    Denies SOB. Feels ok. Thinks nose bleed is slowing.   Objective:   Weight Range: 78.8 kg Body mass index is 24.93 kg/m.   Vital Signs:   Temp:  [97.2 F (36.2 C)-99.5 F (37.5 C)] 98.7 F (37.1 C) (04/25 0630) Pulse Rate:  [0-92] 80 (04/25 0800) Resp:  [12-25] 23 (04/25 0800) BP: (105-161)/(46-94) 142/72 (04/25 0800) SpO2:  [84 %-97 %] 94 % (04/25 0804) Weight:  [78.8 kg] 78.8 kg (04/25 0500) Last BM Date : 02/18/23  Weight change: Filed Weights   02/17/23 0817 02/18/23 0554 02/19/23 0500  Weight: 81 kg 80.4 kg 78.8 kg    Intake/Output:   Intake/Output Summary (Last 24 hours) at 02/19/2023 0834 Last data filed at 02/19/2023 0800 Gross per 24 hour  Intake 1233.52 ml  Output 3790 ml  Net -2556.48 ml   CVP 5-6  Physical Exam   General:  No resp difficulty. In the chair.  HEENT: left nare packed.   Neck: supple. JVP 5-6 . Carotids 2+ bilat; no bruits. No lymphadenopathy or thryomegaly appreciated. Cor: PMI nondisplaced. Regular rate & rhythm. No rubs,  gallops or murmurs. Lungs: clear Abdomen: soft, nontender, nondistended. No hepatosplenomegaly. No bruits or masses. Good bowel sounds. Extremities: no cyanosis, clubbing, rash, R and LLE 1-2+ edema Neuro: alert & orientedx3, cranial nerves grossly intact. moves all 4 extremities w/o difficulty. Affect pleasant    Telemetry  SR 60-70s   EKG    No new EKG to review  Labs    CBC Recent Labs    02/18/23 0930 02/19/23 0443  WBC  --  4.8  HGB 12.2*  12.2* 10.9*  HCT 36.0*  36.0* 34.3*  MCV  --  85.5  PLT  --  139*    Basic Metabolic Panel Recent Labs    78/67/67 0358 02/18/23 0512 02/18/23 0930 02/19/23 0443  NA 127* 128* 129*  129* 132*  K 3.3* 4.0 4.1  4.2 3.9  CL 87* 88*  --  88*  CO2 26 28  --  31  GLUCOSE 268* 193*  --  178*  BUN 64* 61*  --  62*  CREATININE 2.75* 2.59*  --  2.44*  CALCIUM 8.3* 8.5*  --  9.0  MG 2.2  --   --   --    BNP (last 3 results) Recent Labs    01/20/23 1357 02/02/23 1130 02/10/23 1302  BNP 1,939.2* 2,275.8* 2,508.7*      Imaging  CARDIAC CATHETERIZATION  Result Date: 02/18/2023 1. Elevated filling pressures. 2. Moderate pulmonary venous hypertension. 3. Stable CO on milrinone.     Medications:     Scheduled Medications:  Chlorhexidine Gluconate Cloth  6 each Topical Daily   fluticasone furoate-vilanterol  1 puff Inhalation Daily   insulin aspart  0-5 Units Subcutaneous QHS   insulin aspart  0-9 Units Subcutaneous TID WC   insulin glargine-yfgn  24 Units Subcutaneous Daily   linaclotide  72 mcg Oral q morning   pantoprazole  40 mg Oral Daily   rosuvastatin  20 mg Oral QHS   sodium chloride flush  10-40 mL Intracatheter Q12H   sodium chloride flush  3 mL Intravenous Q12H   umeclidinium bromide  1 puff Inhalation Daily    Infusions:  amiodarone 30 mg/hr (02/19/23 0800)   furosemide (LASIX) 200 mg in dextrose 5 % 100 mL (2 mg/mL) infusion 12 mg/hr (02/19/23 0800)   milrinone 0.25 mcg/kg/min (02/19/23 0800)     PRN Medications: acetaminophen, ALPRAZolam, ipratropium-albuterol, mouth rinse, phenol, sodium chloride flush, traZODone   Patient Profile   [name] is a 77 y.o. male with CAD c/p CABG 2003, HFrEF, chronic DOE, Rt CEA >58yrs ago, Lt CEA 14', HTN, DM2, HLD, PAD w/ Rt SFA and Rt politeal artery vascular stents 15', prostate cancer. Admitted with a/c HFrEF.  Assessment/Plan   1. Acute on chronic systolic CHF: Ischemic cardiomyopathy. Echo in 3/24 with EF 35-40%, RV normal. Echo this admission with EF 30%, moderately decreased RV dysfunction with mild RVE, PASP 63 mmHg, IVC dilated. Low output HF this admission, has required inotrope. RHC today showed ongoing volume overload with stable cardiac output on milrinone 0.25. Prominent v-waves but only mild MR on echo (likely due to volume overload/diastolic dysfunction). GDMT limited by hypotension/AKI.   - Yesterday had RHC. RA 12 PCWP 24 CO 4.7 . Started on lasix drip.  - Continue milrinone 0.25. CO-OX 71%.  - CVP 5-6 with lower extremity edema. Add compression stockings.  - Continue lasix drip 12 mg per hour at least 24 more hours.  - Renal function stable.   2.  Atrial flutter: Now in NSR.  - Continue IV amiodarone while on milrinone, convert to PO amio after he is off milrinone.  - Having ongoing nose bleed. Eliquis held. Restart tonight.   3. CAD: Status post CABG 2003.  No ACS this admission. No chest pain.  - No coronary angiography with no ACS and AKI.  4.Carotid Disease: S/p b/l CEA - continue statin 5. PAD: s/p intervention 2015 Rt SFA and Rt politeal artery vascular stents - continue statin  6. Type 2 DM: Per [facility].   7. COPD/emphysema: Uses 2 L home O2 as needed, requiring oxygen here.  8. AKI on CKD stage 4: Baseline creatinine ~ 2.2.  Creatinine 2.8>3>2.75>2.59>>2.44, improving. 9. Epistaxis -4/24 Tranexamic used + afrin  -Eliquis on hold. Restart tonight.  -May need ENT but bleeding slowing.    Length of Stay: 8  [name]  Clegg, NP  02/19/2023, 8:34 AM  Advanced Heart Failure Team Pager [phone] (M-F; 7a - 5p)  Please contact [facility] for night-coverage after hours (5p -7a ) and weekends on [email or web address]  Patient seen with NP, agree with the above note.   Nosebleed overnight, ended up having to be packed by ENT.  Eliquis put on hold.  Cath yesterday with elevated filling pressures, transitioned to Lasix gtt.  Creatinine today down to 2.44 on milrinone 0.25, Lasix gtt 12 mg/hr.  CVP 6-7 but still with significant peripheral edema.   He has gone back into atrial flutter today with controlled rate in 80s, on amiodarone 30 mg/hr.    General: NAD Neck: JVP 8 cm, no thyromegaly or thyroid nodule.  Lungs: Clear to auscultation bilaterally with normal respiratory effort. CV: Nondisplaced PMI.  Heart irregular S1/S2, no S3/S4, no murmur.  1+ edema to knees.  Abdomen: Soft, nontender, no hepatosplenomegaly, no distention.  Skin: Intact without lesions or rashes.  Neurologic: Alert and oriented x 3.  Psych: Normal affect. Extremities: No clubbing or cyanosis.  HEENT: Normal.   CVP down to 6, but PCWP elevated out of proportion on RHC yesterday.  Still with significant peripheral edema. Co-ox 71%. Creatinine lower 2.59 => 2.44.  - Will continue milrinone today at 0.25, wean when IV Lasix stopped.  - Continue Lasix gtt 12 mg/hr for 1 more day.  - Add hydralazine/Imdur at low dose for afterload reduction.  Other GDMT limited by renal dysfunction.   He is back in atrial flutter today with controlled rate.   - Continue amiodarone 30 mg/hr.  - If he is still in AFL after weaning milrinone, will need TEE-DCCV - Eliquis currently on hold with epistaxis (discussed with ENT), would like to restart in am.   CRITICAL CARE Performed by: Marca Ancona  Total critical care time: 35 minutes  Critical care time was exclusive of separately billable procedures and treating other patients.  Critical care was necessary  to treat or prevent imminent or life-threatening deterioration.  Critical care was time spent personally by me on the following activities: development of treatment plan with patient and/or surrogate as well as nursing, discussions with consultants, evaluation of patient's response to treatment, examination of patient, obtaining history from patient or surrogate, ordering and performing treatments and interventions, ordering and review of laboratory studies, ordering and review of radiographic studies, pulse oximetry and re-evaluation of patient's condition.   Marca Ancona 02/19/2023 10:25 AM

## 2023-02-20 DIAGNOSIS — I5023 Acute on chronic systolic (congestive) heart failure: Secondary | ICD-10-CM | POA: Diagnosis not present

## 2023-02-20 LAB — BASIC METABOLIC PANEL
Anion gap: 16 — ABNORMAL HIGH (ref 5–15)
BUN: 61 mg/dL — ABNORMAL HIGH (ref 8–23)
CO2: 29 mmol/L (ref 22–32)
Calcium: 9.1 mg/dL (ref 8.9–10.3)
Chloride: 86 mmol/L — ABNORMAL LOW (ref 98–111)
Creatinine, Ser: 2.55 mg/dL — ABNORMAL HIGH (ref 0.61–1.24)
GFR, Estimated: 26 mL/min — ABNORMAL LOW (ref >60)
Glucose, Bld: 328 mg/dL — ABNORMAL HIGH (ref 70–99)
Potassium: 3.5 mmol/L (ref 3.5–5.1)
Sodium: 131 mmol/L — ABNORMAL LOW (ref 135–145)

## 2023-02-20 LAB — CBC
HCT: 35 % — ABNORMAL LOW (ref 39.0–52.0)
Hemoglobin: 11.2 g/dL — ABNORMAL LOW (ref 13.0–17.0)
MCH: 27.5 pg (ref 26.0–34.0)
MCHC: 32 g/dL (ref 30.0–36.0)
MCV: 86 fL (ref 80.0–100.0)
Platelets: 159 10*3/uL (ref 150–400)
RBC: 4.07 MIL/uL — ABNORMAL LOW (ref 4.22–5.81)
RDW: 18.3 % — ABNORMAL HIGH (ref 11.5–15.5)
WBC: 5.1 10*3/uL (ref 4.0–10.5)
nRBC: 0 % (ref 0.0–0.2)

## 2023-02-20 LAB — GLUCOSE, CAPILLARY
Glucose-Capillary: 351 mg/dL — ABNORMAL HIGH (ref 70–99)
Glucose-Capillary: 282 mg/dL — ABNORMAL HIGH (ref 70–99)
Glucose-Capillary: 214 mg/dL — ABNORMAL HIGH (ref 70–99)
Glucose-Capillary: 304 mg/dL — ABNORMAL HIGH (ref 70–99)

## 2023-02-20 LAB — COOXEMETRY PANEL
Carboxyhemoglobin: 1.8 % — ABNORMAL HIGH (ref 0.5–1.5)
Methemoglobin: <0.7 % (ref 0.0–1.5)
O2 Saturation: 69.5 %
Total hemoglobin: 12.5 g/dL (ref 12.0–16.0)

## 2023-02-20 MED ORDER — ISOSORBIDE MONONITRATE ER 30 MG PO TB24
30.0000 mg | ORAL_TABLET | Freq: Every day | ORAL | Status: DC
  Administered 2023-02-21 – 2023-03-04 (×12): 30 mg via ORAL
  Filled 2023-02-20 (×12): qty 1

## 2023-02-20 MED ORDER — INSULIN GLARGINE-YFGN 100 UNIT/ML ~~LOC~~ SOLN
30.0000 [IU] | Freq: Every day | SUBCUTANEOUS | Status: DC
  Filled 2023-02-20: qty 0.3

## 2023-02-20 MED ORDER — POTASSIUM CHLORIDE CRYS ER 20 MEQ PO TBCR
40.0000 meq | EXTENDED_RELEASE_TABLET | Freq: Once | ORAL | Status: AC
  Administered 2023-02-20: 40 meq via ORAL
  Filled 2023-02-20: qty 2

## 2023-02-20 MED ORDER — HYDRALAZINE HCL 25 MG PO TABS
25.0000 mg | ORAL_TABLET | Freq: Three times a day (TID) | ORAL | Status: DC
  Administered 2023-02-20 – 2023-02-22 (×5): 25 mg via ORAL
  Filled 2023-02-20 (×6): qty 1

## 2023-02-20 MED ORDER — INSULIN ASPART 100 UNIT/ML IJ SOLN
6.0000 [IU] | Freq: Three times a day (TID) | INTRAMUSCULAR | Status: DC
  Administered 2023-02-20 – 2023-03-04 (×29): 6 [IU] via SUBCUTANEOUS

## 2023-02-20 MED ORDER — INSULIN GLARGINE-YFGN 100 UNIT/ML ~~LOC~~ SOLN
10.0000 [IU] | SUBCUTANEOUS | Status: AC
  Administered 2023-02-20: 10 [IU] via SUBCUTANEOUS
  Filled 2023-02-20: qty 0.1

## 2023-02-20 MED ORDER — INSULIN GLARGINE-YFGN 100 UNIT/ML ~~LOC~~ SOLN: 28.0000 [IU] | Freq: Every day | SUBCUTANEOUS | Status: DC

## 2023-02-20 MED ORDER — APIXABAN 5 MG PO TABS
5.0000 mg | ORAL_TABLET | Freq: Two times a day (BID) | ORAL | Status: DC
  Administered 2023-02-20 – 2023-03-04 (×25): 5 mg via ORAL
  Filled 2023-02-20 (×25): qty 1

## 2023-02-20 NOTE — Progress Notes (Signed)
Subjective: The patient complains of intermittent serosanguineous drainage from his nasal cavities.  Objective: Vital signs in last 24 hours: Temp:  [97.4 F (36.3 C)-99.2 F (37.3 C)] 98.7 F (37.1 C) (04/26 1636) Pulse Rate:  [71-103] 74 (04/26 1400) Resp:  [15-27] 17 (04/26 1400) BP: (92-146)/(47-106) 135/47 (04/26 1445) SpO2:  [89 %-98 %] 98 % (04/26 1400)  Physical exam: General appearance: alert and cooperative Head: Normocephalic, without obvious abnormality, atraumatic Eyes: Pupils are equal, round, reactive to light. Extraocular motion is intact.  Ears: Examination of the ears shows normal auricles and external auditory canals bilaterally.  Nose: The left nasal packing is in place.  No acute bleeding is noted. Face: Facial examination shows no asymmetry. Palpation of the face elicit no significant tenderness.  Mouth: Oral cavity examination shows no mucosal lacerations. No significant trismus is noted.  Neck: Palpation of the neck reveals no lymphadenopathy or mass. The trachea is midline.   Recent Labs    02/19/23 1712 02/20/23 0337  WBC 5.2 5.1  HGB 11.5* 11.2*  HCT 36.0* 35.0*  PLT 159 159   Recent Labs    02/19/23 0443 02/20/23 0337  NA 132* 131*  K 3.9 3.5  CL 88* 86*  CO2 31 29  GLUCOSE 178* 328*  BUN 62* 61*  CREATININE 2.44* 2.55*  CALCIUM 9.0 9.1    Medications: I have reviewed the patient's current medications. Scheduled:  apixaban  5 mg Oral BID   cephALEXin  250 mg Oral Q8H   Chlorhexidine Gluconate Cloth  6 each Topical Daily   fluticasone furoate-vilanterol  1 puff Inhalation Daily   hydrALAZINE  25 mg Oral Q8H   insulin aspart  0-5 Units Subcutaneous QHS   insulin aspart  0-9 Units Subcutaneous TID WC   insulin aspart  6 Units Subcutaneous TID WC   [START ON 02/21/2023] insulin glargine-yfgn  30 Units Subcutaneous Daily   [START ON 02/21/2023] isosorbide mononitrate  30 mg Oral Daily   linaclotide  72 mcg Oral q morning   pantoprazole   40 mg Oral Daily   rosuvastatin  20 mg Oral QHS   sodium chloride flush  10-40 mL Intracatheter Q12H   sodium chloride flush  3 mL Intravenous Q12H   umeclidinium bromide  1 puff Inhalation Daily   Continuous:  amiodarone 30 mg/hr (02/20/23 1629)   milrinone 0.125 mcg/kg/min (02/20/23 0935)   ZOX:[id number], ALPRAZolam, ipratropium-albuterol, mouth rinse, phenol, sodium chloride flush, traZODone  Assessment/Plan: Severe left-sided epistaxis, status post left anterior/posterior packing. -No significant bleeding over the past 24 hours. -The patient was started on Eliquis earlier today. -Nasal drip pad as needed. -Will remove nasal packing on Monday.   LOS: 9 days   Noheli Melder W Jeda Pardue 02/20/2023, 5:39 PM

## 2023-02-20 NOTE — Progress Notes (Addendum)
Patient ID: [name], male   DOB: [birth year], 77 y.o.   MRN: [id number]     Advanced Heart Failure Rounding Note  PCP-Cardiologist: Kristeen Miss, MD   Subjective:    - 4/20 while on dobutamine patient went into atrial flutter with RVR, became hypotensive.  Decision made to start amiodarone and transferred to CVICU. - 4/22 CO-OX marginal. Milrinone increased to 0.25 mcg. Diuresed with IV lasix. - 4/24 RHC with elevated filling pressures, transitioned to Lasix gtt at 12 mg per hour.  - 4/25 Epistaxis requiring packing by ENT . Went back into AFL   Today remains on milrinone 0.25 + lasix gtt at 12/hr. Co-ox 70%. 3L in UOP yesterday. Wt not charted. CVP 5. Continues w/ clear urine output.   SCr 2.44>>2.55 K 3.5   C/w nose packing. No further bleeding. Hgb stable at 11.2.   Remains in Afib, 80s-90s.   Feels much better today other than irritation from nasal packing. Breathing improved. Appetite is great. Ate all of his breakfast. No other complaints.     4/24 RHC Procedural Findings (milrinone 0.25): Hemodynamics (mmHg) RA mean 12 RV 55/16 PA 57/22, mean 34 PCWP mean 24, v-waves to 35Oxygen saturations: PA 51% AO 86% Cardiac Output (Fick) 4.71  Cardiac Index (Fick) 2.37 PVR 2.1 WU    Denies SOB. Feels ok. Thinks nose bleed is slowing.   Objective:   Weight Range: 78.8 kg Body mass index is 24.93 kg/m.   Vital Signs:   Temp:  [96.1 F (35.6 C)-99.1 F (37.3 C)] 98 F (36.7 C) (04/26 0300) Pulse Rate:  [70-91] 78 (04/26 0000) Resp:  [13-23] 19 (04/26 0000) BP: (107-172)/(55-106) 107/74 (04/26 0615) SpO2:  [89 %-100 %] 89 % (04/26 0000) Last BM Date : 02/19/23  Weight change: Filed Weights   02/17/23 0817 02/18/23 0554 02/19/23 0500  Weight: 81 kg 80.4 kg 78.8 kg    Intake/Output:   Intake/Output Summary (Last 24 hours) at 02/20/2023 0738 Last data filed at 02/20/2023 0600 Gross per 24 hour  Intake 659.43 ml  Output 2825 ml  Net -2165.57 ml     Physical Exam   CVP 5  General:  Well appearing, sitting up in chair. No respiratory difficulty HEENT: normal + lt nare nasal packing  Neck: supple. JVD not elevated. Carotids 2+ bilat; no bruits. No lymphadenopathy or thyromegaly appreciated. Cor: PMI nondisplaced. Irregularly irregular rhythm. No rubs, gallops or murmurs. Lungs: decreased BS at the bases bilaterally  Abdomen: soft, nontender, nondistended. No hepatosplenomegaly. No bruits or masses. Good bowel sounds. Extremities: no cyanosis, clubbing, rash, 1+ b/l ankle edema + RUE PICC  Neuro: alert & oriented x 3, cranial nerves grossly intact. moves all 4 extremities w/o difficulty. Affect pleasant.     Telemetry   Afib 80s-90s   EKG    No new EKG to review  Labs    CBC Recent Labs    02/19/23 1712 02/20/23 0337  WBC 5.2 5.1  HGB 11.5* 11.2*  HCT 36.0* 35.0*  MCV 85.9 86.0  PLT 159 159    Basic Metabolic Panel Recent Labs    78/29/56 0443 02/20/23 0337  NA 132* 131*  K 3.9 3.5  CL 88* 86*  CO2 31 29  GLUCOSE 178* 328*  BUN 62* 61*  CREATININE 2.44* 2.55*  CALCIUM 9.0 9.1   BNP (last 3 results) Recent Labs    01/20/23 1357 02/02/23 1130 02/10/23 1302  BNP 1,939.2* 2,275.8* 2,508.7*      Imaging  No results found.   Medications:     Scheduled Medications:  cephALEXin  250 mg Oral Q8H   Chlorhexidine Gluconate Cloth  6 each Topical Daily   fluticasone furoate-vilanterol  1 puff Inhalation Daily   hydrALAZINE  10 mg Oral Q8H   insulin aspart  0-5 Units Subcutaneous QHS   insulin aspart  0-9 Units Subcutaneous TID WC   insulin aspart  3 Units Subcutaneous TID WC   insulin glargine-yfgn  24 Units Subcutaneous Daily   isosorbide mononitrate  15 mg Oral Daily   linaclotide  72 mcg Oral q morning   pantoprazole  40 mg Oral Daily   rosuvastatin  20 mg Oral QHS   sodium chloride flush  10-40 mL Intracatheter Q12H   sodium chloride flush  3 mL Intravenous Q12H   umeclidinium  bromide  1 puff Inhalation Daily    Infusions:  amiodarone 30 mg/hr (02/20/23 0600)   furosemide (LASIX) 200 mg in dextrose 5 % 100 mL (2 mg/mL) infusion 12 mg/hr (02/20/23 0600)   milrinone 0.25 mcg/kg/min (02/20/23 0600)    PRN Medications: acetaminophen, ALPRAZolam, ipratropium-albuterol, mouth rinse, phenol, sodium chloride flush, traZODone   Patient Profile   Mr. Haring is a 77 y.o. male with CAD c/p CABG 2003, HFrEF, chronic DOE, Rt CEA >97yrs ago, Lt CEA 14', HTN, DM2, HLD, PAD w/ Rt SFA and Rt politeal artery vascular stents 15', prostate cancer. Admitted with a/c HFrEF.  Assessment/Plan   1. Acute on chronic systolic CHF: Ischemic cardiomyopathy. Echo in 3/24 with EF 35-40%, RV normal. Echo this admission with EF 30%, moderately decreased RV dysfunction with mild RVE, PASP 63 mmHg, IVC dilated. Low output HF this admission, has required inotrope. RHC today showed ongoing volume overload with stable cardiac output on milrinone 0.25. Prominent v-waves but only mild MR on echo (likely due to volume overload/diastolic dysfunction). GDMT limited by hypotension/AKI.   - RHC this admit RA 12 PCWP 24 CO 4.7 . Started on lasix drip.  - On milrinone 0.25. CO-OX 70%. Diuresing well w/ lasix gtt. CVP 5 but c/w mild peripheral edema. Switch to intermittent IV Lasix 80 mg bid. Add TED hoses, likely transition to PO diuretics tomorrow   - continue hydral/imdur for afterload reduction  2.  Atrial flutter: rate controlled  - Continue IV amiodarone while on milrinone, convert to PO amio after he is off milrinone.  - no further nose bleeding overnight. Hgb stable. Restart Eliquis today and monitor  3. CAD: Status post CABG 2003.  No ACS this admission. No chest pain.  - No coronary angiography with no ACS and AKI.  4.Carotid Disease: S/p b/l CEA - continue statin 5. PAD: s/p intervention 2015 Rt SFA and Rt politeal artery vascular stents - continue statin  6. Type 2 DM: Per [facility].   7.  COPD/emphysema: Uses 2 L home O2 as needed, requiring oxygen here.  8. AKI on CKD stage 4: Baseline creatinine ~ 2.2.  Creatinine 2.8>3>2.75>2.59>>2.44>>2.5 - diuretics per above, follow BMP  9. Epistaxis -4/24 Tranexamic used + afrin  - nasal packing 4/25 - no further visible bleeding. Hgb stable - monitor closely w/ Eliquis restart - continue cephalexin while nasal packing in place, probably remove Monday.    Length of Stay: [address] PA-C  02/20/2023, 7:38 AM  Advanced Heart Failure Team Pager [phone] (M-F; 7a - 5p)  Please contact [facility] for night-coverage after hours (5p -7a ) and weekends on [email or web address]  Patient seen with PA, agree  with the above note.   Good diuresis again yesterday, CVP 5 this morning. Co-ox 69.5% on milrinone 0.25.  Creatinine stable at 2.55.    Feels fine, nose remains packed but no further bleeding.   General: NAD Neck: No JVD, no thyromegaly or thyroid nodule.  Lungs: Clear to auscultation bilaterally with normal respiratory effort. CV: Nondisplaced PMI.  Heart regular S1/S2, no S3/S4, no murmur.  1+ ankle edema.   Abdomen: Soft, nontender, no hepatosplenomegaly, no distention.  Skin: Intact without lesions or rashes.  Neurologic: Alert and oriented x 3.  Psych: Normal affect. Extremities: No clubbing or cyanosis.  HEENT: Normal.   Good diuresis again, CVP down to 5. Creatinine fairly stable at 2.55 and good co-ox.   - I suspect residual peripheral edema may be due to venous insufficiency.  Stop IV Lasix today.  Will start po torsemide tomorrow.  - Decrease milrinone to 0.125 daily.  - Increase hydralazine to 25 mg tid and Imdur to 30 daily as we titrate off milrinone.   OK to restart Eliquis today.  He remains in atypical atrial flutter. Will continue amiodarone gtt, if he does not convert over the weekend, would arrange for TEE-DCCV after weaning of milrinone.   Marca Ancona 02/20/2023 9:33 AM

## 2023-02-20 NOTE — Progress Notes (Signed)
PROGRESS NOTE    [name]  ZOX:[id number] DOB: 12/30/1945 DOA: 02/10/2023 PCP: Gaspar Garbe, MD  76/M w chronic systolic CHF, T2DM, CKD, COPD chronic hypoxemic respiratory failure and hypertension who presented with lower extremity edema. Recent hospitalization 3/26-3/30 for CHF. Cardiology outpatient follow up he had gain 5 lbs from the time of his discharge. At home he continue to have worsening edema, and weight gain for 9 lbs more. In the ED, + edema and rales, labs w/ cr 2,32, BNP 2,508, CXR noted cardiomegaly, bilateral hilar vascular congestion, with bilateral interstitial infiltrates, small bilateral pleural effusions, -Admitted, started on diuretics -4/19 -started on dobutamine infusion.  -4/20 developed VT TX to ICU -4/21: CVP 14, diuresing, continued amio,  Started milrinone -4/23 improvement in volume status  -4/24: RHC with elevated filling pressures, wedge of 24, cardiac output 4.7 on milrinone -4/24: Recurrent epistaxis, treated with Afrin and tranexamic acid, Eliquis held -4/26: Persistent epistaxis ENT consulted anterior/posterior nasal packing performed by Dr. Suszanne Conners   Subjective: -Feels better, breathing continues to improve, epistaxis has slowed  Assessment and Plan:  Acute on chronic systolic CHF (congestive heart failure) Acute hypoxic respiratory failure -Echo with EF 35 to 40%, no LVH, RV systolic function preserved,  -Admitted with low output heart failure.  -CHF team following, diuresed well on milrinone and Lasix drip, down 23 pounds, creatinine stable -Right heart cath with elevated filling pressures,  -Transitioning to oral diuretics tomorrow, continue Imdur and hydralazine -Attempt to wean down O2, on 2 L at baseline  AKI on CKD3b -baseline creat around 2.2 -now 2.5, monitor  Persistent Atrial flutter -Rate improving, on IV amiodarone -Restarting apixaban today, plan for TEE/DCCV next week if does not convert  Persistent epistaxis -Since  4/24 evening, attempted Afrin and TXA with temporary relief only, recurrence of epistaxis overnight -ENT consulted, completed anterior/posterior nasal packing by Dr. Rulon Sera 4/25, appreciate input, restart Eliquis today, leave nasal packing in place till Monday and gram-positive antibiotic coverage  CAD CABG -ischemic workup deferred w/ CKD   PAD - s/p intervention 2015 Rt SFA and Rt politeal artery vascular stents, continue statin   GERD (gastroesophageal reflux disease) -Continue protonix 40 mg daily.  Type 2 diabetes mellitus with hyperlipidemia -Uncontrolled T2DM with hyperglycemia.  -Increase glargine, add meal coverage  COPD (chronic obstructive pulmonary disease) -On 2 L home O2 -Continue current medical management.   DNR (do not resuscitate)  DVT prophylaxis: SCDs Code Status: DNR Family Communication: None present Disposition Plan: Home likely Monday  Consultants: CHF team   Procedures:   Antimicrobials:    Objective: Vitals:   02/20/23 0840 02/20/23 0852 02/20/23 0930 02/20/23 1132  BP: (!) 126/59     Pulse:  72    Resp:  15    Temp:   98.1 F (36.7 C) 99.2 F (37.3 C)  TempSrc:   Oral Oral  SpO2:  92%    Weight:      Height:        Intake/Output Summary (Last 24 hours) at 02/20/2023 1341 Last data filed at 02/20/2023 0800 Gross per 24 hour  Intake 488.13 ml  Output 2650 ml  Net -2161.87 ml   Filed Weights   02/17/23 0817 02/18/23 0554 02/19/23 0500  Weight: 81 kg 80.4 kg 78.8 kg    Examination:  General exam: Pleasant male sitting up in the recliner, AAOx3 no JVD CVS: S1-S2, regular rhythm Lungs: Decreased breath sounds at the bases otherwise clear Abdomen: Soft, nontender, bowel sounds present Extremities: 1-2+  edema Psychiatry:  Mood & affect appropriate.     Data Reviewed:   CBC: Recent Labs  Lab 02/14/23 0912 02/15/23 0445 02/18/23 0930 02/19/23 0443 02/19/23 1712 02/20/23 0337  WBC 4.1 5.3  --  4.8 5.2 5.1  HGB 11.6*  11.8* 12.2*  12.2* 10.9* 11.5* 11.2*  HCT 36.4* 36.3* 36.0*  36.0* 34.3* 36.0* 35.0*  MCV 85.8 84.6  --  85.5 85.9 86.0  PLT 153 170  --  139* 159 159   Basic Metabolic Panel: Recent Labs  Lab 02/14/23 0912 02/14/23 1205 02/15/23 0445 02/16/23 0413 02/17/23 0358 02/18/23 0512 02/18/23 0930 02/19/23 0443 02/20/23 0337  NA 130* 132* 130* 130* 127* 128* 129*  129* 132* 131*  K 3.6 3.4* 3.6 3.9 3.3* 4.0 4.1  4.2 3.9 3.5  CL 89* 87* 88* 86* 87* 88*  --  88* 86*  CO2 27 28 27 28 26 28   --  31 29  GLUCOSE 361* 336* 261* 226* 268* 193*  --  178* 328*  BUN 52* 54* 60* 66* 64* 61*  --  62* 61*  CREATININE 2.66* 2.67* 2.80* 3.08* 2.75* 2.59*  --  2.44* 2.55*  CALCIUM 9.1 8.8* 8.6* 9.0 8.3* 8.5*  --  9.0 9.1  MG 2.2 2.0 2.1 2.1 2.2  --   --   --   --    GFR: Estimated Creatinine Clearance: 25.8 mL/min (A) (by C-G formula based on SCr of 2.55 mg/dL (H)). Liver Function Tests: No results for input(s): "AST", "ALT", "ALKPHOS", "BILITOT", "PROT", "ALBUMIN" in the last 168 hours.  No results for input(s): "LIPASE", "AMYLASE" in the last 168 hours. No results for input(s): "AMMONIA" in the last 168 hours. Coagulation Profile: Recent Labs  Lab 02/18/23 2057  INR 1.9*   Cardiac Enzymes: No results for input(s): "CKTOTAL", "CKMB", "CKMBINDEX", "TROPONINI" in the last 168 hours. BNP (last 3 results) No results for input(s): "PROBNP" in the last 8760 hours. HbA1C: No results for input(s): "HGBA1C" in the last 72 hours. CBG: Recent Labs  Lab 02/19/23 1131 02/19/23 1634 02/19/23 2135 02/20/23 0629 02/20/23 1134  GLUCAP 377* 129* 216* 351* 282*   Lipid Profile: No results for input(s): "CHOL", "HDL", "LDLCALC", "TRIG", "CHOLHDL", "LDLDIRECT" in the last 72 hours. Thyroid Function Tests: No results for input(s): "TSH", "T4TOTAL", "FREET4", "T3FREE", "THYROIDAB" in the last 72 hours. Anemia Panel: No results for input(s): "VITAMINB12", "FOLATE", "FERRITIN", "TIBC", "IRON",  "RETICCTPCT" in the last 72 hours. Urine analysis:    Component Value Date/Time   COLORURINE STRAW (A) 10/19/2022 0628   APPEARANCEUR CLEAR 10/19/2022 0628   LABSPEC 1.009 10/19/2022 0628   PHURINE 5.0 10/19/2022 0628   GLUCOSEU >=500 (A) 10/19/2022 0628   HGBUR NEGATIVE 10/19/2022 0628   BILIRUBINUR NEGATIVE 10/19/2022 0628   KETONESUR NEGATIVE 10/19/2022 0628   PROTEINUR NEGATIVE 10/19/2022 0628   UROBILINOGEN 1.0 07/19/2013 1223   NITRITE NEGATIVE 10/19/2022 0628   LEUKOCYTESUR NEGATIVE 10/19/2022 0628   Sepsis Labs: @LABRCNTIP (procalcitonin:4,lacticidven:4)  ) Recent Results (from the past 240 hour(s))  MRSA Next Gen by PCR, Nasal     Status: None   Collection Time: 02/14/23  2:46 PM   Specimen: Nasal Mucosa; Nasal Swab  Result Value Ref Range Status   MRSA by PCR Next Gen NOT DETECTED NOT DETECTED Final    Comment: (NOTE) The GeneXpert MRSA Assay (FDA approved for NASAL specimens only), is one component of a comprehensive MRSA colonization surveillance program. It is not intended to diagnose MRSA infection nor to guide or  monitor treatment for MRSA infections. Test performance is not FDA approved in patients less than 22 years old. Performed at [facility], [address], [place], [place] [address]      Radiology Studies: No results found.   Scheduled Meds:  apixaban  5 mg Oral BID   cephALEXin  250 mg Oral Q8H   Chlorhexidine Gluconate Cloth  6 each Topical Daily   fluticasone furoate-vilanterol  1 puff Inhalation Daily   hydrALAZINE  25 mg Oral Q8H   insulin aspart  0-5 Units Subcutaneous QHS   insulin aspart  0-9 Units Subcutaneous TID WC   insulin aspart  6 Units Subcutaneous TID WC   [START ON 02/21/2023] insulin glargine-yfgn  30 Units Subcutaneous Daily   [START ON 02/21/2023] isosorbide mononitrate  30 mg Oral Daily   linaclotide  72 mcg Oral q morning   pantoprazole  40 mg Oral Daily   rosuvastatin  20 mg Oral QHS   sodium chloride flush   10-40 mL Intracatheter Q12H   sodium chloride flush  3 mL Intravenous Q12H   umeclidinium bromide  1 puff Inhalation Daily   Continuous Infusions:  amiodarone 30 mg/hr (02/20/23 0600)   milrinone 0.125 mcg/kg/min (02/20/23 0935)     LOS: 9 days    Time spent:    Zannie Cove, MD [facility]   02/20/2023, 1:41 PM

## 2023-02-21 DIAGNOSIS — I5023 Acute on chronic systolic (congestive) heart failure: Secondary | ICD-10-CM | POA: Diagnosis not present

## 2023-02-21 LAB — CBC
HCT: 36.9 % — ABNORMAL LOW (ref 39.0–52.0)
Hemoglobin: 11.3 g/dL — ABNORMAL LOW (ref 13.0–17.0)
MCH: 26.8 pg (ref 26.0–34.0)
MCHC: 30.6 g/dL (ref 30.0–36.0)
MCV: 87.4 fL (ref 80.0–100.0)
Platelets: 177 10*3/uL (ref 150–400)
RBC: 4.22 MIL/uL (ref 4.22–5.81)
RDW: 18.1 % — ABNORMAL HIGH (ref 11.5–15.5)
WBC: 6.1 10*3/uL (ref 4.0–10.5)
nRBC: 0 % (ref 0.0–0.2)

## 2023-02-21 LAB — BASIC METABOLIC PANEL
Anion gap: 9 (ref 5–15)
BUN: 54 mg/dL — ABNORMAL HIGH (ref 8–23)
CO2: 30 mmol/L (ref 22–32)
Calcium: 8.8 mg/dL — ABNORMAL LOW (ref 8.9–10.3)
Chloride: 92 mmol/L — ABNORMAL LOW (ref 98–111)
Creatinine, Ser: 2.29 mg/dL — ABNORMAL HIGH (ref 0.61–1.24)
GFR, Estimated: 29 mL/min — ABNORMAL LOW (ref >60)
Glucose, Bld: 215 mg/dL — ABNORMAL HIGH (ref 70–99)
Potassium: 3.6 mmol/L (ref 3.5–5.1)
Sodium: 131 mmol/L — ABNORMAL LOW (ref 135–145)

## 2023-02-21 LAB — GLUCOSE, CAPILLARY
Glucose-Capillary: 171 mg/dL — ABNORMAL HIGH (ref 70–99)
Glucose-Capillary: 267 mg/dL — ABNORMAL HIGH (ref 70–99)
Glucose-Capillary: 113 mg/dL — ABNORMAL HIGH (ref 70–99)
Glucose-Capillary: 338 mg/dL — ABNORMAL HIGH (ref 70–99)
Glucose-Capillary: 245 mg/dL — ABNORMAL HIGH (ref 70–99)
Glucose-Capillary: 200 mg/dL — ABNORMAL HIGH (ref 70–99)

## 2023-02-21 LAB — COOXEMETRY PANEL
Carboxyhemoglobin: 1.2 % (ref 0.5–1.5)
Methemoglobin: <0.7 % (ref 0.0–1.5)
O2 Saturation: 55.9 %
Total hemoglobin: 11.7 g/dL — ABNORMAL LOW (ref 12.0–16.0)

## 2023-02-21 MED ORDER — POTASSIUM CHLORIDE CRYS ER 20 MEQ PO TBCR
40.0000 meq | EXTENDED_RELEASE_TABLET | Freq: Once | ORAL | Status: AC
  Administered 2023-02-21: 40 meq via ORAL
  Filled 2023-02-21: qty 2

## 2023-02-21 MED ORDER — TORSEMIDE 20 MG PO TABS
20.0000 mg | ORAL_TABLET | Freq: Every day | ORAL | Status: DC
  Administered 2023-02-21 – 2023-02-25 (×5): 20 mg via ORAL
  Filled 2023-02-21 (×5): qty 1

## 2023-02-21 MED ORDER — AMIODARONE HCL 200 MG PO TABS
200.0000 mg | ORAL_TABLET | Freq: Two times a day (BID) | ORAL | Status: DC
  Administered 2023-02-21 – 2023-02-27 (×12): 200 mg via ORAL
  Filled 2023-02-21 (×13): qty 1

## 2023-02-21 MED ORDER — INSULIN GLARGINE-YFGN 100 UNIT/ML ~~LOC~~ SOLN
35.0000 [IU] | Freq: Every day | SUBCUTANEOUS | Status: DC
  Administered 2023-02-21 – 2023-03-04 (×12): 35 [IU] via SUBCUTANEOUS
  Filled 2023-02-21 (×13): qty 0.35

## 2023-02-21 NOTE — Progress Notes (Signed)
Patient ID: [name], male   DOB: 01-14-1946, 77 y.o.   MRN: [id number]     Advanced Heart Failure Rounding Note  PCP-Cardiologist: Kristeen Miss, MD   Subjective:    - 4/20 while on dobutamine patient went into atrial flutter with RVR, became hypotensive.  Decision made to start amiodarone and transferred to CVICU. - 4/22 CO-OX marginal. Milrinone increased to 0.25 mcg. Diuresed with IV lasix. - 4/24 RHC with elevated filling pressures, transitioned to Lasix gtt at 12 mg per hour.  - 4/25 Epistaxis requiring packing by ENT . Went back into AFL   Today remains on milrinone 0.125 with co-ox 56%.  CVP 5.  Feels "blah."   SCr 2.44>>2.55>>2.29  C/w nose packing. No further bleeding.  Atrial flutter rate in 90s on amiodarone 30 mg/hr.   4/24 RHC Procedural Findings (milrinone 0.25): Hemodynamics (mmHg) RA mean 12 RV 55/16 PA 57/22, mean 34 PCWP mean 24, v-waves to 35Oxygen saturations: PA 51% AO 86% Cardiac Output (Fick) 4.71  Cardiac Index (Fick) 2.37 PVR 2.1 WU    Objective:   Weight Range: 76.7 kg Body mass index is 24.26 kg/m.   Vital Signs:   Temp:  [98.7 F (37.1 C)-99.2 F (37.3 C)] 99.1 F (37.3 C) (04/27 0754) Pulse Rate:  [74-103] 81 (04/27 0800) Resp:  [12-24] 16 (04/27 0800) BP: (93-148)/(41-99) 128/61 (04/27 0639) SpO2:  [89 %-100 %] 91 % (04/27 0800) Weight:  [76.7 kg] 76.7 kg (04/27 0600) Last BM Date : 02/20/23  Weight change: Filed Weights   02/18/23 0554 02/19/23 0500 02/21/23 0600  Weight: 80.4 kg 78.8 kg 76.7 kg    Intake/Output:   Intake/Output Summary (Last 24 hours) at 02/21/2023 1006 Last data filed at 02/21/2023 0800 Gross per 24 hour  Intake 842.13 ml  Output 2140 ml  Net -1297.87 ml    Physical Exam   CVP 5  General: NAD Neck: No JVD, no thyromegaly or thyroid nodule.  Lungs: Clear to auscultation bilaterally with normal respiratory effort. CV: Nondisplaced PMI.  Heart irregular S1/S2, no S3/S4, no murmur.  1+ ankle  edema.    Abdomen: Soft, nontender, no hepatosplenomegaly, no distention.  Skin: Intact without lesions or rashes.  Neurologic: Alert and oriented x 3.  Psych: Normal affect. Extremities: No clubbing or cyanosis.  HEENT: Normal.    Telemetry   Atrial flutter rate 90s   EKG    No new EKG to review  Labs    CBC Recent Labs    02/20/23 0337 02/21/23 0431  WBC 5.1 6.1  HGB 11.2* 11.3*  HCT 35.0* 36.9*  MCV 86.0 87.4  PLT 159 177    Basic Metabolic Panel Recent Labs    40/98/11 0337 02/21/23 0431  NA 131* 131*  K 3.5 3.6  CL 86* 92*  CO2 29 30  GLUCOSE 328* 215*  BUN 61* 54*  CREATININE 2.55* 2.29*  CALCIUM 9.1 8.8*   BNP (last 3 results) Recent Labs    01/20/23 1357 02/02/23 1130 02/10/23 1302  BNP 1,939.2* 2,275.8* 2,508.7*      Imaging    No results found.   Medications:     Scheduled Medications:  apixaban  5 mg Oral BID   cephALEXin  250 mg Oral Q8H   Chlorhexidine Gluconate Cloth  6 each Topical Daily   fluticasone furoate-vilanterol  1 puff Inhalation Daily   hydrALAZINE  25 mg Oral Q8H   insulin aspart  0-5 Units Subcutaneous QHS   insulin aspart  0-9  Units Subcutaneous TID WC   insulin aspart  6 Units Subcutaneous TID WC   insulin glargine-yfgn  35 Units Subcutaneous Daily   isosorbide mononitrate  30 mg Oral Daily   linaclotide  72 mcg Oral q morning   pantoprazole  40 mg Oral Daily   rosuvastatin  20 mg Oral QHS   sodium chloride flush  10-40 mL Intracatheter Q12H   sodium chloride flush  3 mL Intravenous Q12H   umeclidinium bromide  1 puff Inhalation Daily    Infusions:  amiodarone 30 mg/hr (02/21/23 0800)   milrinone 0.125 mcg/kg/min (02/21/23 0800)    PRN Medications: acetaminophen, ALPRAZolam, ipratropium-albuterol, mouth rinse, phenol, sodium chloride flush, traZODone   Patient Profile   [name] is a 77 y.o. male with CAD c/p CABG 2003, HFrEF, chronic DOE, Rt CEA >41yrs ago, Lt CEA 14', HTN, DM2, HLD, PAD w/ Rt  SFA and Rt politeal artery vascular stents 15', prostate cancer. Admitted with a/c HFrEF.  Assessment/Plan   1. Acute on chronic systolic CHF: Ischemic cardiomyopathy. Echo in 3/24 with EF 35-40%, RV normal. Echo this admission with EF 30%, moderately decreased RV dysfunction with mild RVE, PASP 63 mmHg, IVC dilated. Low output HF this admission, has required inotrope. RHC today showed ongoing volume overload with stable cardiac output on milrinone 0.25. Prominent v-waves but only mild MR on echo (likely due to volume overload/diastolic dysfunction). GDMT limited by hypotension/AKI.  Creatinine lower today at 2.29 and milrinone down to 0.125 with co-ox 56% early am.   - Start torsemide 20 mg daily today, may need to increase.  - Stop milrinone.  - Continue current hydralazine/Imdur.  - Can add additional GDMT if creatinine continues to trend down.  2.  Atrial flutter: rate controlled.   - Continue IV amiodarone.  - Stopping milrinone today, if he remains in atrial flutter on Monday would arrange for TEE-DCCV.   - Continue Eliquis.  3. CAD: Status post CABG 2003.  No ACS this admission. No chest pain.  - Continue Crestor.  - No coronary angiography with no ACS and AKI.  4.Carotid Disease: S/p b/l CEA - continue statin 5. PAD: s/p intervention 2015 Rt SFA and Rt politeal artery vascular stents - continue statin  6. Type 2 DM: Per [facility].   7. COPD/emphysema: Uses 2 L home O2 as needed, requiring oxygen here.  8. AKI on CKD stage 4: Baseline creatinine ~ 2.2.  Creatinine 2.8>3>2.75>2.59>>2.44>>2.5>>2.29.  - diuretics per above, follow BMP  9. Epistaxis: 4/24 Transexamic used + Afrin.  Nasal packing 4/25. No further visible bleeding. Hgb stable.  - monitor closely w/ Eliquis restart - continue cephalexin while nasal packing in place,  remove Monday.    Length of Stay: 10  Marca Ancona, MD  02/21/2023, 10:06 AM  Advanced Heart Failure Team Pager [phone] (M-F; 7a - 5p)  Please contact  [facility] for night-coverage after hours (5p -7a ) and weekends on [email or web address]

## 2023-02-21 NOTE — Progress Notes (Signed)
Dr Shirlee Latch notified of qtc.  Orders received.

## 2023-02-21 NOTE — Progress Notes (Signed)
PROGRESS NOTE    [name]  ZOX:[id number] DOB: 09/07/1946 DOA: 02/10/2023 PCP: Gaspar Garbe, MD  77/M w chronic systolic CHF, T2DM, CKD, COPD chronic hypoxemic respiratory failure and hypertension who presented with lower extremity edema. Recent hospitalization 3/26-3/30 for CHF. Cardiology outpatient follow up he had gain 5 lbs from the time of his discharge. At home he continue to have worsening edema, and weight gain for 9 lbs more. In the ED, + edema and rales, labs w/ cr 2,32, BNP 2,508, CXR noted cardiomegaly, bilateral hilar vascular congestion, with bilateral interstitial infiltrates, small bilateral pleural effusions, -Admitted, started on diuretics -4/19 -started on dobutamine infusion.  -4/20 developed VT TX to ICU -4/21: CVP 14, diuresing, continued amio,  Started milrinone -4/24: RHC with elevated filling pressures, wedge of 24, cardiac output 4.7 on milrinone -4/24: Recurrent epistaxis, treated with Afrin and tranexamic acid, Eliquis held -4/26: Persistent epistaxis ENT consulted anterior/posterior nasal packing performed by Dr. Suszanne Conners   Subjective: -Feels fair, no events overnight  Assessment and Plan:  Acute on chronic systolic CHF (congestive heart failure) Acute hypoxic respiratory failure -Echo with EF 35 to 40%, no LVH, RV systolic function preserved,  -Admitted with low output heart failure.  -CHF team following, diuresed well on milrinone and Lasix drip, down 23 pounds, creatinine stable -Right heart cath with elevated filling pressures,  -Transitioning to oral torsemide today, stopping milrinone, continue Imdur and hydralazine -Attempt to wean down O2, on 2 L at baseline  AKI on CKD3b -baseline creat around 2.2 -Creatinine improving, down to 2.2 today  Persistent Atrial flutter -Rate improving, on IV amiodarone -Continue apixaban, plan for TEE/DCCV next week if does not convert  Persistent epistaxis -Since 4/24 evening, attempted Afrin and TXA  with temporary relief only,  -ENT consulted, completed anterior/posterior nasal packing by Dr. Rulon Sera 4/25, appreciate input, resume Eliquis, leave nasal packing in place till Monday and gram-positive antibiotic coverage  CAD CABG -ischemic workup deferred w/ CKD   PAD - s/p intervention 2015 Rt SFA and Rt politeal artery vascular stents, continue statin   GERD (gastroesophageal reflux disease) -Continue protonix 40 mg daily.  Type 2 diabetes mellitus with hyperlipidemia -Uncontrolled T2DM with hyperglycemia.  -Increase glargine, continue meal coverage  COPD (chronic obstructive pulmonary disease) -On 2 L home O2 -Stable.   DNR (do not resuscitate)  DVT prophylaxis: SCDs Code Status: DNR Family Communication: None present Disposition Plan: Home likely Monday/Tuesday  Consultants: CHF team   Procedures:   Antimicrobials:    Objective: Vitals:   02/21/23 0700 02/21/23 0754 02/21/23 0800 02/21/23 1149  BP:      Pulse: (!) 102  81   Resp: 15  16   Temp:  99.1 F (37.3 C)  (!) 97.1 F (36.2 C)  TempSrc:  Oral  Axillary  SpO2: 90%  91%   Weight:      Height:        Intake/Output Summary (Last 24 hours) at 02/21/2023 1158 Last data filed at 02/21/2023 0800 Gross per 24 hour  Intake 842.13 ml  Output 1840 ml  Net -997.87 ml   Filed Weights   02/18/23 0554 02/19/23 0500 02/21/23 0600  Weight: 80.4 kg 78.8 kg 76.7 kg    Examination:  General exam: Pleasant male sitting up in the recliner, AAOx3 no JVD CVS: S1-S2, regular rhythm Lungs: Decreased breath sounds at the bases otherwise clear Abdomen: Soft, nontender, bowel sounds present Extremities: 1-2+ edema Psychiatry:  Mood & affect appropriate.     Data  Reviewed:   CBC: Recent Labs  Lab 02/15/23 0445 02/18/23 0930 02/19/23 0443 02/19/23 1712 02/20/23 0337 02/21/23 0431  WBC 5.3  --  4.8 5.2 5.1 6.1  HGB 11.8* 12.2*  12.2* 10.9* 11.5* 11.2* 11.3*  HCT 36.3* 36.0*  36.0* 34.3* 36.0* 35.0*  36.9*  MCV 84.6  --  85.5 85.9 86.0 87.4  PLT 170  --  139* 159 159 177   Basic Metabolic Panel: Recent Labs  Lab 02/14/23 1205 02/15/23 0445 02/16/23 0413 02/17/23 0358 02/18/23 0512 02/18/23 0930 02/19/23 0443 02/20/23 0337 02/21/23 0431  NA 132* 130* 130* 127* 128* 129*  129* 132* 131* 131*  K 3.4* 3.6 3.9 3.3* 4.0 4.1  4.2 3.9 3.5 3.6  CL 87* 88* 86* 87* 88*  --  88* 86* 92*  CO2 28 27 28 26 28   --  31 29 30   GLUCOSE 336* 261* 226* 268* 193*  --  178* 328* 215*  BUN 54* 60* 66* 64* 61*  --  62* 61* 54*  CREATININE 2.67* 2.80* 3.08* 2.75* 2.59*  --  2.44* 2.55* 2.29*  CALCIUM 8.8* 8.6* 9.0 8.3* 8.5*  --  9.0 9.1 8.8*  MG 2.0 2.1 2.1 2.2  --   --   --   --   --    GFR: Estimated Creatinine Clearance: 28.8 mL/min (A) (by C-G formula based on SCr of 2.29 mg/dL (H)). Liver Function Tests: No results for input(s): "AST", "ALT", "ALKPHOS", "BILITOT", "PROT", "ALBUMIN" in the last 168 hours.  No results for input(s): "LIPASE", "AMYLASE" in the last 168 hours. No results for input(s): "AMMONIA" in the last 168 hours. Coagulation Profile: Recent Labs  Lab 02/18/23 2057  INR 1.9*   Cardiac Enzymes: No results for input(s): "CKTOTAL", "CKMB", "CKMBINDEX", "TROPONINI" in the last 168 hours. BNP (last 3 results) No results for input(s): "PROBNP" in the last 8760 hours. HbA1C: No results for input(s): "HGBA1C" in the last 72 hours. CBG: Recent Labs  Lab 02/20/23 1637 02/20/23 2203 02/21/23 0702 02/21/23 0755 02/21/23 1141  GLUCAP 214* 304* 171* 267* 113*   Lipid Profile: No results for input(s): "CHOL", "HDL", "LDLCALC", "TRIG", "CHOLHDL", "LDLDIRECT" in the last 72 hours. Thyroid Function Tests: No results for input(s): "TSH", "T4TOTAL", "FREET4", "T3FREE", "THYROIDAB" in the last 72 hours. Anemia Panel: No results for input(s): "VITAMINB12", "FOLATE", "FERRITIN", "TIBC", "IRON", "RETICCTPCT" in the last 72 hours. Urine analysis:    Component Value Date/Time    COLORURINE STRAW (A) 10/19/2022 0628   APPEARANCEUR CLEAR 10/19/2022 0628   LABSPEC 1.009 10/19/2022 0628   PHURINE 5.0 10/19/2022 0628   GLUCOSEU >=500 (A) 10/19/2022 0628   HGBUR NEGATIVE 10/19/2022 0628   BILIRUBINUR NEGATIVE 10/19/2022 0628   KETONESUR NEGATIVE 10/19/2022 0628   PROTEINUR NEGATIVE 10/19/2022 0628   UROBILINOGEN 1.0 07/19/2013 1223   NITRITE NEGATIVE 10/19/2022 0628   LEUKOCYTESUR NEGATIVE 10/19/2022 0628   Sepsis Labs: @LABRCNTIP (procalcitonin:4,lacticidven:4)  ) Recent Results (from the past 240 hour(s))  MRSA Next Gen by PCR, Nasal     Status: None   Collection Time: 02/14/23  2:46 PM   Specimen: Nasal Mucosa; Nasal Swab  Result Value Ref Range Status   MRSA by PCR Next Gen NOT DETECTED NOT DETECTED Final    Comment: (NOTE) The GeneXpert MRSA Assay (FDA approved for NASAL specimens only), is one component of a comprehensive MRSA colonization surveillance program. It is not intended to diagnose MRSA infection nor to guide or monitor treatment for MRSA infections. Test performance is not FDA  approved in patients less than 76 years old. Performed at [facility], [address], [place], [place] [address]      Radiology Studies: No results found.   Scheduled Meds:  apixaban  5 mg Oral BID   cephALEXin  250 mg Oral Q8H   Chlorhexidine Gluconate Cloth  6 each Topical Daily   fluticasone furoate-vilanterol  1 puff Inhalation Daily   hydrALAZINE  25 mg Oral Q8H   insulin aspart  0-5 Units Subcutaneous QHS   insulin aspart  0-9 Units Subcutaneous TID WC   insulin aspart  6 Units Subcutaneous TID WC   insulin glargine-yfgn  35 Units Subcutaneous Daily   isosorbide mononitrate  30 mg Oral Daily   linaclotide  72 mcg Oral q morning   pantoprazole  40 mg Oral Daily   rosuvastatin  20 mg Oral QHS   sodium chloride flush  10-40 mL Intracatheter Q12H   sodium chloride flush  3 mL Intravenous Q12H   torsemide  20 mg Oral Daily   umeclidinium bromide   1 puff Inhalation Daily   Continuous Infusions:  amiodarone 30 mg/hr (02/21/23 0800)     LOS: 10 days    Time spent:    Zannie Cove, MD [facility]   02/21/2023, 11:58 AM

## 2023-02-22 DIAGNOSIS — I5023 Acute on chronic systolic (congestive) heart failure: Secondary | ICD-10-CM | POA: Diagnosis not present

## 2023-02-22 LAB — BASIC METABOLIC PANEL
Anion gap: 9 (ref 5–15)
BUN: 56 mg/dL — ABNORMAL HIGH (ref 8–23)
CO2: 30 mmol/L (ref 22–32)
Calcium: 9 mg/dL (ref 8.9–10.3)
Chloride: 93 mmol/L — ABNORMAL LOW (ref 98–111)
Creatinine, Ser: 2.22 mg/dL — ABNORMAL HIGH (ref 0.61–1.24)
GFR, Estimated: 30 mL/min — ABNORMAL LOW (ref >60)
Glucose, Bld: 163 mg/dL — ABNORMAL HIGH (ref 70–99)
Potassium: 4 mmol/L (ref 3.5–5.1)
Sodium: 132 mmol/L — ABNORMAL LOW (ref 135–145)

## 2023-02-22 LAB — CBC
HCT: 37 % — ABNORMAL LOW (ref 39.0–52.0)
Hemoglobin: 11.6 g/dL — ABNORMAL LOW (ref 13.0–17.0)
MCH: 27 pg (ref 26.0–34.0)
MCHC: 31.4 g/dL (ref 30.0–36.0)
MCV: 86 fL (ref 80.0–100.0)
Platelets: 189 10*3/uL (ref 150–400)
RBC: 4.3 MIL/uL (ref 4.22–5.81)
RDW: 18.3 % — ABNORMAL HIGH (ref 11.5–15.5)
WBC: 6.4 10*3/uL (ref 4.0–10.5)
nRBC: 0 % (ref 0.0–0.2)

## 2023-02-22 LAB — COOXEMETRY PANEL
Carboxyhemoglobin: 1.8 % — ABNORMAL HIGH (ref 0.5–1.5)
Carboxyhemoglobin: 1.7 % — ABNORMAL HIGH (ref 0.5–1.5)
Methemoglobin: <0.7 % (ref 0.0–1.5)
Methemoglobin: 1.2 % (ref 0.0–1.5)
O2 Saturation: 49.8 %
O2 Saturation: 48.6 %
Total hemoglobin: 11.6 g/dL — ABNORMAL LOW (ref 12.0–16.0)
Total hemoglobin: 11.8 g/dL — ABNORMAL LOW (ref 12.0–16.0)

## 2023-02-22 LAB — GLUCOSE, CAPILLARY
Glucose-Capillary: 170 mg/dL — ABNORMAL HIGH (ref 70–99)
Glucose-Capillary: 192 mg/dL — ABNORMAL HIGH (ref 70–99)

## 2023-02-22 LAB — MAGNESIUM: Magnesium: 2.1 mg/dL (ref 1.7–2.4)

## 2023-02-22 MED ORDER — POTASSIUM CHLORIDE CRYS ER 20 MEQ PO TBCR
20.0000 meq | EXTENDED_RELEASE_TABLET | Freq: Every day | ORAL | Status: DC
  Administered 2023-02-22 – 2023-03-03 (×10): 20 meq via ORAL
  Filled 2023-02-22 (×10): qty 1

## 2023-02-22 MED ORDER — HYDRALAZINE HCL 25 MG PO TABS
37.5000 mg | ORAL_TABLET | Freq: Three times a day (TID) | ORAL | Status: DC
  Administered 2023-02-22 – 2023-03-03 (×22): 37.5 mg via ORAL
  Filled 2023-02-22 (×26): qty 2

## 2023-02-22 MED ORDER — SODIUM CHLORIDE 0.9 % IV SOLN: INTRAVENOUS | Status: DC

## 2023-02-22 NOTE — Progress Notes (Signed)
PROGRESS NOTE    [name]  WUJ:[id number] DOB: 06/18/1946 DOA: 02/10/2023 PCP: Gaspar Garbe, MD  77/M w chronic systolic CHF, T2DM, CKD, COPD chronic hypoxemic respiratory failure and hypertension who presented with lower extremity edema. Recent hospitalization 3/26-3/30 for CHF. Cardiology outpatient follow up he had gain 5 lbs from the time of his discharge. At home he continue to have worsening edema, and weight gain for 9 lbs more. In the ED, + edema and rales, labs w/ cr 2,32, BNP 2,508, CXR noted cardiomegaly, bilateral hilar vascular congestion, with bilateral interstitial infiltrates, small bilateral pleural effusions, -Admitted, started on diuretics -4/19 -started on dobutamine infusion.  -4/20 developed VT TX to ICU -4/21: CVP 14, diuresing, continued amio,  Started milrinone -4/24: RHC with elevated filling pressures, wedge of 24, cardiac output 4.7 on milrinone -4/24: Recurrent epistaxis, treated with Afrin and tranexamic acid, Eliquis held -4/26: Persistent epistaxis ENT consulted anterior/posterior nasal packing performed by Dr. Suszanne Conners -4/27, amiodarone changed to p.o.  Subjective: -Feels okay, no events overnight, no overt bleeding  Assessment and Plan:  Acute on chronic systolic CHF (congestive heart failure) Acute hypoxic respiratory failure -Echo with EF 35 to 40%, no LVH, RV systolic function preserved,  -Admitted with low output heart failure.  -CHF team following, diuresed well on milrinone and Lasix drip, down 23 pounds, creatinine stable -Improving, appears euvolemic, now off milrinone, continue torsemide -Continue Imdur and hydralazine  AKI on CKD3b -baseline creat around 2.2 -Creatinine improving, creatinine stabilizing, now 2.2, continue torsemide  Persistent Atrial flutter -Rate improving, was on IV amiodarone, changed to p.o. -Continue apixaban, plan for TEE/DCCV tomorrow  Persistent epistaxis -Since 4/24 evening, attempted Afrin and TXA with  temporary relief only,  -ENT consulted, completed anterior/posterior nasal packing by Dr. Rulon Sera 4/25, appreciate input, resume Eliquis, leave nasal packing in place till Monday and gram-positive antibiotic coverage  CAD CABG -ischemic workup deferred w/ CKD   PAD - s/p intervention 2015 Rt SFA and Rt politeal artery vascular stents, continue statin   GERD (gastroesophageal reflux disease) -Continue protonix 40 mg daily.  Type 2 diabetes mellitus with hyperlipidemia -Uncontrolled T2DM with hyperglycemia.  -Improved pain  COPD (chronic obstructive pulmonary disease) -On 2 L home O2 -Stable.   DNR (do not resuscitate)  DVT prophylaxis: SCDs Code Status: DNR Family Communication: None present Disposition Plan: Home likely Monday/Tuesday  Consultants: CHF team   Procedures:   Antimicrobials:    Objective: Vitals:   02/22/23 0800 02/22/23 0807 02/22/23 0900 02/22/23 1000  BP: (!) 127/50  105/80   Pulse: 84 71 74 94  Resp: (!) 23 17 20 17   Temp: 97.8 F (36.6 C)     TempSrc: Oral     SpO2: 97% 93% 93% 92%  Weight:      Height:        Intake/Output Summary (Last 24 hours) at 02/22/2023 1038 Last data filed at 02/22/2023 0444 Gross per 24 hour  Intake 353.34 ml  Output 1540 ml  Net -1186.66 ml   Filed Weights   02/19/23 0500 02/21/23 0600 02/22/23 0530  Weight: 78.8 kg 76.7 kg 77.5 kg    Examination:  General exam: Pleasant male sitting up in recliner, AAOx3 HEENT: No JVD CVS: S1-S2, irregular rhythm Lungs: Decreased breath sounds to bases Abdomen: Soft, nontender, bowel sounds present Extremities: Trace edema Psychiatry:  Mood & affect appropriate.     Data Reviewed:   CBC: Recent Labs  Lab 02/19/23 0443 02/19/23 1712 02/20/23 9562 02/21/23 0431 02/22/23 0042  WBC 4.8 5.2 5.1 6.1 6.4  HGB 10.9* 11.5* 11.2* 11.3* 11.6*  HCT 34.3* 36.0* 35.0* 36.9* 37.0*  MCV 85.5 85.9 86.0 87.4 86.0  PLT 139* 159 159 177 189   Basic Metabolic  Panel: Recent Labs  Lab 02/16/23 0413 02/17/23 0358 02/18/23 0512 02/18/23 0930 02/19/23 0443 02/20/23 0337 02/21/23 0431 02/22/23 0042  NA 130* 127* 128* 129*  129* 132* 131* 131* 132*  K 3.9 3.3* 4.0 4.1  4.2 3.9 3.5 3.6 4.0  CL 86* 87* 88*  --  88* 86* 92* 93*  CO2 28 26 28   --  31 29 30 30   GLUCOSE 226* 268* 193*  --  178* 328* 215* 163*  BUN 66* 64* 61*  --  62* 61* 54* 56*  CREATININE 3.08* 2.75* 2.59*  --  2.44* 2.55* 2.29* 2.22*  CALCIUM 9.0 8.3* 8.5*  --  9.0 9.1 8.8* 9.0  MG 2.1 2.2  --   --   --   --   --  2.1   GFR: Estimated Creatinine Clearance: 29.7 mL/min (A) (by C-G formula based on SCr of 2.22 mg/dL (H)). Liver Function Tests: No results for input(s): "AST", "ALT", "ALKPHOS", "BILITOT", "PROT", "ALBUMIN" in the last 168 hours.  No results for input(s): "LIPASE", "AMYLASE" in the last 168 hours. No results for input(s): "AMMONIA" in the last 168 hours. Coagulation Profile: Recent Labs  Lab 02/18/23 2057  INR 1.9*   Cardiac Enzymes: No results for input(s): "CKTOTAL", "CKMB", "CKMBINDEX", "TROPONINI" in the last 168 hours. BNP (last 3 results) No results for input(s): "PROBNP" in the last 8760 hours. HbA1C: No results for input(s): "HGBA1C" in the last 72 hours. CBG: Recent Labs  Lab 02/21/23 1131 02/21/23 1141 02/21/23 1625 02/21/23 2145 02/22/23 0614  GLUCAP 338* 113* 245* 200* 170*   Lipid Profile: No results for input(s): "CHOL", "HDL", "LDLCALC", "TRIG", "CHOLHDL", "LDLDIRECT" in the last 72 hours. Thyroid Function Tests: No results for input(s): "TSH", "T4TOTAL", "FREET4", "T3FREE", "THYROIDAB" in the last 72 hours. Anemia Panel: No results for input(s): "VITAMINB12", "FOLATE", "FERRITIN", "TIBC", "IRON", "RETICCTPCT" in the last 72 hours. Urine analysis:    Component Value Date/Time   COLORURINE STRAW (A) 10/19/2022 0628   APPEARANCEUR CLEAR 10/19/2022 0628   LABSPEC 1.009 10/19/2022 0628   PHURINE 5.0 10/19/2022 0628   GLUCOSEU  >=500 (A) 10/19/2022 0628   HGBUR NEGATIVE 10/19/2022 0628   BILIRUBINUR NEGATIVE 10/19/2022 0628   KETONESUR NEGATIVE 10/19/2022 0628   PROTEINUR NEGATIVE 10/19/2022 0628   UROBILINOGEN 1.0 07/19/2013 1223   NITRITE NEGATIVE 10/19/2022 0628   LEUKOCYTESUR NEGATIVE 10/19/2022 0628   Sepsis Labs: @LABRCNTIP (procalcitonin:4,lacticidven:4)  ) Recent Results (from the past 240 hour(s))  MRSA Next Gen by PCR, Nasal     Status: None   Collection Time: 02/14/23  2:46 PM   Specimen: Nasal Mucosa; Nasal Swab  Result Value Ref Range Status   MRSA by PCR Next Gen NOT DETECTED NOT DETECTED Final    Comment: (NOTE) The GeneXpert MRSA Assay (FDA approved for NASAL specimens only), is one component of a comprehensive MRSA colonization surveillance program. It is not intended to diagnose MRSA infection nor to guide or monitor treatment for MRSA infections. Test performance is not FDA approved in patients less than 10 years old. Performed at [facility], [address], [place], [place] [address]      Radiology Studies: No results found.   Scheduled Meds:  amiodarone  200 mg Oral BID   apixaban  5  mg Oral BID   cephALEXin  250 mg Oral Q8H   Chlorhexidine Gluconate Cloth  6 each Topical Daily   fluticasone furoate-vilanterol  1 puff Inhalation Daily   hydrALAZINE  37.5 mg Oral Q8H   insulin aspart  0-5 Units Subcutaneous QHS   insulin aspart  0-9 Units Subcutaneous TID WC   insulin aspart  6 Units Subcutaneous TID WC   insulin glargine-yfgn  35 Units Subcutaneous Daily   isosorbide mononitrate  30 mg Oral Daily   linaclotide  72 mcg Oral q morning   pantoprazole  40 mg Oral Daily   rosuvastatin  20 mg Oral QHS   sodium chloride flush  10-40 mL Intracatheter Q12H   sodium chloride flush  3 mL Intravenous Q12H   torsemide  20 mg Oral Daily   umeclidinium bromide  1 puff Inhalation Daily   Continuous Infusions:     LOS: 11 days    Time spent:    Zannie Cove, MD [facility]   02/22/2023, 10:38 AM

## 2023-02-22 NOTE — Progress Notes (Signed)
Patient ID: [name], male   DOB: 1946-10-05, 77 y.o.   MRN: [id number]     Advanced Heart Failure Rounding Note  PCP-Cardiologist: Connor Miss, MD   Subjective:    - 4/20 while on dobutamine patient went into atrial flutter with RVR, became hypotensive.  Decision made to start amiodarone and transferred to CVICU. - 4/22 CO-OX marginal. Milrinone increased to 0.25 mcg. Diuresed with IV lasix. - 4/24 RHC with elevated filling pressures, transitioned to Lasix gtt at 12 mg per hour.  - 4/25 Epistaxis requiring packing by ENT. Went back into AFL   He is now off milrinone, early am co-ox 50%.  CVP 6 today.    SCr 2.44>>2.55>>2.29>>2.22  C/w nose packing. No further bleeding.  Atrial flutter rate in 70s on amiodarone 200 mg bid.   4/24 RHC Procedural Findings (milrinone 0.25): Hemodynamics (mmHg) RA mean 12 RV 55/16 PA 57/22, mean 34 PCWP mean 24, v-waves to 35Oxygen saturations: PA 51% AO 86% Cardiac Output (Fick) 4.71  Cardiac Index (Fick) 2.37 PVR 2.1 WU    Objective:   Weight Range: 77.5 kg Body mass index is 24.52 kg/m.   Vital Signs:   Temp:  [97.1 F (36.2 C)-98.5 F (36.9 C)] 97.8 F (36.6 C) (04/28 0800) Pulse Rate:  [71-107] 71 (04/28 0807) Resp:  [12-33] 17 (04/28 0807) BP: (110-141)/(50-79) 127/50 (04/28 0800) SpO2:  [83 %-98 %] 93 % (04/28 0807) Weight:  [77.5 kg] 77.5 kg (04/28 0530) Last BM Date : 02/20/23  Weight change: Filed Weights   02/19/23 0500 02/21/23 0600 02/22/23 0530  Weight: 78.8 kg 76.7 kg 77.5 kg    Intake/Output:   Intake/Output Summary (Last 24 hours) at 02/22/2023 0923 Last data filed at 02/22/2023 0444 Gross per 24 hour  Intake 373.13 ml  Output 1540 ml  Net -1166.87 ml    Physical Exam   CVP 6  General: NAD Neck: No JVD, no thyromegaly or thyroid nodule.  Lungs: Clear to auscultation bilaterally with normal respiratory effort. CV: Nondisplaced PMI.  Heart irregular S1/S2, no S3/S4, no murmur.  1+ edema to knees.   Abdomen: Soft, nontender, no hepatosplenomegaly, no distention.  Skin: Intact without lesions or rashes.  Neurologic: Alert and oriented x 3.  Psych: Normal affect. Extremities: No clubbing or cyanosis.  HEENT: Normal.   Telemetry   Atrial flutter rate 70s   EKG    No new EKG to review  Labs    CBC Recent Labs    02/21/23 0431 02/22/23 0042  WBC 6.1 6.4  HGB 11.3* 11.6*  HCT 36.9* 37.0*  MCV 87.4 86.0  PLT 177 189    Basic Metabolic Panel Recent Labs    40/98/11 0431 02/22/23 0042  NA 131* 132*  K 3.6 4.0  CL 92* 93*  CO2 30 30  GLUCOSE 215* 163*  BUN 54* 56*  CREATININE 2.29* 2.22*  CALCIUM 8.8* 9.0  MG  --  2.1   BNP (last 3 results) Recent Labs    01/20/23 1357 02/02/23 1130 02/10/23 1302  BNP 1,939.2* 2,275.8* 2,508.7*      Imaging    No results found.   Medications:     Scheduled Medications:  amiodarone  200 mg Oral BID   apixaban  5 mg Oral BID   cephALEXin  250 mg Oral Q8H   Chlorhexidine Gluconate Cloth  6 each Topical Daily   fluticasone furoate-vilanterol  1 puff Inhalation Daily   hydrALAZINE  37.5 mg Oral Q8H   insulin  aspart  0-5 Units Subcutaneous QHS   insulin aspart  0-9 Units Subcutaneous TID WC   insulin aspart  6 Units Subcutaneous TID WC   insulin glargine-yfgn  35 Units Subcutaneous Daily   isosorbide mononitrate  30 mg Oral Daily   linaclotide  72 mcg Oral q morning   pantoprazole  40 mg Oral Daily   rosuvastatin  20 mg Oral QHS   sodium chloride flush  10-40 mL Intracatheter Q12H   sodium chloride flush  3 mL Intravenous Q12H   torsemide  20 mg Oral Daily   umeclidinium bromide  1 puff Inhalation Daily    Infusions:    PRN Medications: acetaminophen, ALPRAZolam, ipratropium-albuterol, mouth rinse, phenol, sodium chloride flush, traZODone   Patient Profile   [name] is a 77 y.o. male with CAD c/p CABG 2003, HFrEF, chronic DOE, Rt CEA >33yrs ago, Lt CEA 14', HTN, DM2, HLD, PAD w/ Rt SFA and Rt  politeal artery vascular stents 15', prostate cancer. Admitted with a/c HFrEF.  Assessment/Plan   1. Acute on chronic systolic CHF: Ischemic cardiomyopathy. Echo in 3/24 with EF 35-40%, RV normal. Echo this admission with EF 30%, moderately decreased RV dysfunction with mild RVE, PASP 63 mmHg, IVC dilated. Low output HF this admission, has required inotrope. RHC today showed ongoing volume overload with stable cardiac output on milrinone 0.25. Prominent v-waves but only mild MR on echo (likely due to volume overload/diastolic dysfunction). GDMT limited by hypotension/AKI.  Creatinine lower today at 2.29 => 2.22 and off milrinone.  Co-ox lower at 50% early am, CVP 6.     - Continue torsemide 20 mg daily.  - Will leave off milrinone, recheck co-ox.  - Increase hydralazine to 37.5 mg tid and continue Imdur 30 daily.   - Can add additional GDMT if creatinine continues to trend down.  2.  Atrial flutter: Rate controlled.  Amiodarone IV stopped yesterday with prolonged QTc and po started.  QTc was shorter today on telemetry.  - Now on po amiodarone 200 mg bid, ECG for QTc today.   - Plan for TEE-guided DCCV tomorrow if we can get a slot. Discussed risks/benefits with patient and he agrees to procedure.    - Continue Eliquis.  3. CAD: Status post CABG 2003.  No ACS this admission. No chest pain.  - Continue Crestor.  - No coronary angiography with no ACS and AKI.  4.Carotid Disease: S/p b/l CEA - continue statin 5. PAD: s/p intervention 2015 Rt SFA and Rt politeal artery vascular stents - continue statin  6. Type 2 DM: Per [facility].   7. COPD/emphysema: Uses 2 L home O2 as needed, requiring oxygen here.  8. AKI on CKD stage 4: Baseline creatinine ~ 2.2.  Creatinine 2.8>3>2.75>2.59>>2.44>>2.5>>2.29>>2.22.  - diuretics per above, follow BMP  9. Epistaxis: 4/24 Transexamic used + Afrin.  Nasal packing 4/25. No further visible bleeding. Hgb stable.  - monitor closely w/ Eliquis restart - continue  cephalexin while nasal packing in place,  remove Monday.    Length of Stay: 17  Marca Ancona, MD  02/22/2023, 9:23 AM  Advanced Heart Failure Team Pager [phone] (M-F; 7a - 5p)  Please contact [facility] for night-coverage after hours (5p -7a ) and weekends on [email or web address]

## 2023-02-23 ENCOUNTER — Encounter (HOSPITAL_COMMUNITY)
Admission: EM | Disposition: A | Discharge status: Home Health | Payer: Self-pay | Source: Home / Self Care | Attending: Internal Medicine

## 2023-02-23 ENCOUNTER — Inpatient Hospital Stay (HOSPITAL_COMMUNITY): Payer: Medicare HMO | Admitting: Certified Registered Nurse Anesthetist

## 2023-02-23 ENCOUNTER — Inpatient Hospital Stay (HOSPITAL_COMMUNITY): Payer: Medicare HMO

## 2023-02-23 DIAGNOSIS — E1151 Type 2 diabetes mellitus with diabetic peripheral angiopathy without gangrene: Secondary | ICD-10-CM

## 2023-02-23 DIAGNOSIS — I4891 Unspecified atrial fibrillation: Secondary | ICD-10-CM

## 2023-02-23 DIAGNOSIS — I2510 Atherosclerotic heart disease of native coronary artery without angina pectoris: Secondary | ICD-10-CM

## 2023-02-23 DIAGNOSIS — I340 Nonrheumatic mitral (valve) insufficiency: Secondary | ICD-10-CM

## 2023-02-23 DIAGNOSIS — I509 Heart failure, unspecified: Secondary | ICD-10-CM

## 2023-02-23 DIAGNOSIS — Z87891 Personal history of nicotine dependence: Secondary | ICD-10-CM

## 2023-02-23 DIAGNOSIS — Z7984 Long term (current) use of oral hypoglycemic drugs: Secondary | ICD-10-CM

## 2023-02-23 DIAGNOSIS — Z794 Long term (current) use of insulin: Secondary | ICD-10-CM

## 2023-02-23 DIAGNOSIS — I110 Hypertensive heart disease with heart failure: Secondary | ICD-10-CM

## 2023-02-23 DIAGNOSIS — I5023 Acute on chronic systolic (congestive) heart failure: Secondary | ICD-10-CM | POA: Diagnosis not present

## 2023-02-23 DIAGNOSIS — I350 Nonrheumatic aortic (valve) stenosis: Secondary | ICD-10-CM

## 2023-02-23 HISTORY — PX: CARDIOVERSION: SHX1299

## 2023-02-23 HISTORY — PX: TEE WITHOUT CARDIOVERSION: SHX5443

## 2023-02-23 LAB — PROTIME-INR
INR: 2.1 — ABNORMAL HIGH (ref 0.8–1.2)
Prothrombin Time: 23 seconds — ABNORMAL HIGH (ref 11.4–15.2)

## 2023-02-23 LAB — CBC
HCT: 36.9 % — ABNORMAL LOW (ref 39.0–52.0)
Hemoglobin: 11.3 g/dL — ABNORMAL LOW (ref 13.0–17.0)
MCH: 26.8 pg (ref 26.0–34.0)
MCHC: 30.6 g/dL (ref 30.0–36.0)
MCV: 87.4 fL (ref 80.0–100.0)
Platelets: 200 10*3/uL (ref 150–400)
RBC: 4.22 MIL/uL (ref 4.22–5.81)
RDW: 18.4 % — ABNORMAL HIGH (ref 11.5–15.5)
WBC: 6.5 10*3/uL (ref 4.0–10.5)
nRBC: 0 % (ref 0.0–0.2)

## 2023-02-23 LAB — COOXEMETRY PANEL
Carboxyhemoglobin: 2 % — ABNORMAL HIGH (ref 0.5–1.5)
Carboxyhemoglobin: 2.1 % — ABNORMAL HIGH (ref 0.5–1.5)
Methemoglobin: <0.7 % (ref 0.0–1.5)
Methemoglobin: <0.7 % (ref 0.0–1.5)
O2 Saturation: 52.4 %
O2 Saturation: 55 %
Total hemoglobin: 11.5 g/dL — ABNORMAL LOW (ref 12.0–16.0)
Total hemoglobin: 11.5 g/dL — ABNORMAL LOW (ref 12.0–16.0)

## 2023-02-23 LAB — BASIC METABOLIC PANEL
Anion gap: 11 (ref 5–15)
BUN: 60 mg/dL — ABNORMAL HIGH (ref 8–23)
CO2: 29 mmol/L (ref 22–32)
Calcium: 9.1 mg/dL (ref 8.9–10.3)
Chloride: 94 mmol/L — ABNORMAL LOW (ref 98–111)
Creatinine, Ser: 2.36 mg/dL — ABNORMAL HIGH (ref 0.61–1.24)
GFR, Estimated: 28 mL/min — ABNORMAL LOW (ref >60)
Glucose, Bld: 179 mg/dL — ABNORMAL HIGH (ref 70–99)
Potassium: 3.9 mmol/L (ref 3.5–5.1)
Sodium: 134 mmol/L — ABNORMAL LOW (ref 135–145)

## 2023-02-23 LAB — GLUCOSE, CAPILLARY
Glucose-Capillary: 150 mg/dL — ABNORMAL HIGH (ref 70–99)
Glucose-Capillary: 166 mg/dL — ABNORMAL HIGH (ref 70–99)
Glucose-Capillary: 153 mg/dL — ABNORMAL HIGH (ref 70–99)
Glucose-Capillary: 150 mg/dL — ABNORMAL HIGH (ref 70–99)
Glucose-Capillary: 131 mg/dL — ABNORMAL HIGH (ref 70–99)
Glucose-Capillary: 114 mg/dL — ABNORMAL HIGH (ref 70–99)
Glucose-Capillary: 245 mg/dL — ABNORMAL HIGH (ref 70–99)

## 2023-02-23 SURGERY — ECHOCARDIOGRAM, TRANSESOPHAGEAL
Anesthesia: Monitor Anesthesia Care

## 2023-02-23 MED ORDER — SODIUM CHLORIDE 0.9 % IV SOLN: INTRAVENOUS | Status: DC

## 2023-02-23 MED ORDER — DEXMEDETOMIDINE HCL IN NACL 80 MCG/20ML IV SOLN
INTRAVENOUS | Status: DC | PRN
  Administered 2023-02-23 (×2): 8 ug via INTRAVENOUS

## 2023-02-23 MED ORDER — LIDOCAINE VISCOUS HCL 2 % MT SOLN
OROMUCOSAL | Status: AC
  Filled 2023-02-23: qty 15

## 2023-02-23 MED ORDER — PROPOFOL 500 MG/50ML IV EMUL
INTRAVENOUS | Status: DC | PRN
  Administered 2023-02-23: 100 ug/kg/min via INTRAVENOUS

## 2023-02-23 MED ORDER — EPHEDRINE SULFATE (PRESSORS) 50 MG/ML IJ SOLN
INTRAMUSCULAR | Status: DC | PRN
  Administered 2023-02-23: 2.5 mg via INTRAVENOUS
  Administered 2023-02-23: 5 mg via INTRAVENOUS

## 2023-02-23 SURGICAL SUPPLY — 1 items: ELECT DEFIB PAD ADLT CADENCE (PAD) ×2 IMPLANT

## 2023-02-23 NOTE — Anesthesia Preprocedure Evaluation (Addendum)
Anesthesia Evaluation  Patient identified by MRN, date of birth, ID band Patient awake    Reviewed: Allergy & Precautions, NPO status , Patient's Chart, lab work & pertinent test results  Airway Mallampati: II  TM Distance: >3 FB Neck ROM: Full    Dental  (+) Edentulous Upper, Edentulous Lower, Dental Advisory Given   Pulmonary COPD (2L O2 prn),  oxygen dependent, former smoker   Pulmonary exam normal breath sounds clear to auscultation       Cardiovascular hypertension, Pt. on medications + CAD, + CABG, + Peripheral Vascular Disease and +CHF  Normal cardiovascular exam+ dysrhythmias Atrial Fibrillation  Rhythm:Regular Rate:Normal  1. Left ventricular ejection fraction, by estimation, is 30%. The left  ventricle has moderate to severely decreased function. The left ventricle  demonstrates regional wall motion abnormalities with severe hypokinesis of  the septal, anterior and inferior   walls. There was relative preservation of anterolateral/inferolateral  wall motion. Left ventricular diastolic parameters are consistent with  Grade II diastolic dysfunction (pseudonormalization).   2. Right ventricular systolic function is moderately reduced. The right  ventricular size is mildly enlarged. There is severely elevated pulmonary  artery systolic pressure. The estimated right ventricular systolic  pressure is 63.2 mmHg.   3. Left atrial size was mildly dilated.   4. Right atrial size was mildly dilated.   5. The mitral valve is normal in structure. Mild mitral valve  regurgitation. No evidence of mitral stenosis.   6. Tricuspid valve regurgitation is mild to moderate.   7. The aortic valve is tricuspid. There is moderate calcification of the  aortic valve. Aortic valve regurgitation is moderate. No aortic stenosis  is present.   8. The inferior vena cava is dilated in size with <50% respiratory  variability, suggesting right atrial  pressure of 15 mmHg.     Neuro/Psych negative neurological ROS  negative psych ROS   GI/Hepatic Neg liver ROS,GERD  ,,  Endo/Other  diabetes, Type 2, Oral Hypoglycemic Agents, Insulin Dependent    Renal/GU Renal InsufficiencyRenal disease  negative genitourinary   Musculoskeletal  (+) Arthritis ,    Abdominal   Peds  Hematology negative hematology ROS (+)   Anesthesia Other Findings [name] is a 77 y.o. male with CAD c/p CABG 2003, HFrEF, chronic DOE, Rt CEA >62yrs ago, Lt CEA 14', HTN, DM2, HLD, PAD w/ Rt SFA and Rt politeal artery vascular stents 15', prostate cancer. Admitted with a/c HFrEF  Reproductive/Obstetrics                             Anesthesia Physical Anesthesia Plan  ASA: 3  Anesthesia Plan: MAC and General   Post-op Pain Management: Minimal or no pain anticipated   Induction: Intravenous  PONV Risk Score and Plan: 2 and Treatment may vary due to age or medical condition  Airway Management Planned: Nasal Cannula, Natural Airway and Simple Face Mask  Additional Equipment: None  Intra-op Plan:   Post-operative Plan:   Informed Consent:   Plan Discussed with:   Anesthesia Plan Comments:         Anesthesia Quick Evaluation

## 2023-02-23 NOTE — Anesthesia Procedure Notes (Signed)
Procedure Name: MAC Date/Time: 02/23/2023 2:30 PM  Performed by: Jodell Cipro, CRNAPre-anesthesia Checklist: Patient identified, Emergency Drugs available, Suction available, Patient being monitored and Timeout performed Oxygen Delivery Method: Nasal cannula Airway Equipment and Method: Bite block Placement Confirmation: positive ETCO2 Dental Injury: Teeth and Oropharynx as per pre-operative assessment

## 2023-02-23 NOTE — Progress Notes (Signed)
Subjective: No significant bleeding over the weekend.  Objective: Vital signs in last 24 hours: Temp:  [97.6 F (36.4 C)-99.4 F (37.4 C)] 99.4 F (37.4 C) (04/29 0700) Pulse Rate:  [70-100] 98 (04/29 0630) Resp:  [10-28] 20 (04/29 0630) BP: (98-144)/(51-82) 119/67 (04/29 0630) SpO2:  [80 %-98 %] 91 % (04/29 0630) Weight:  [77.4 kg] 77.4 kg (04/29 0620)  Physical exam: General appearance: alert and cooperative Head: Normocephalic, without obvious abnormality, atraumatic Eyes: Pupils are equal, round, reactive to light. Extraocular motion is intact.  Ears: Examination of the ears shows normal auricles and external auditory canals bilaterally.  Nose: The left nasal packing is in place.  No acute bleeding is noted. Face: Facial examination shows no asymmetry. Palpation of the face elicit no significant tenderness.  Mouth: Oral cavity examination shows no mucosal lacerations. No significant trismus is noted.  Neck: Palpation of the neck reveals no lymphadenopathy or mass. The trachea is midline.    Recent Labs    02/22/23 0042 02/23/23 0424  WBC 6.4 6.5  HGB 11.6* 11.3*  HCT 37.0* 36.9*  PLT 189 200   Recent Labs    02/22/23 0042 02/23/23 0424  NA 132* 134*  K 4.0 3.9  CL 93* 94*  CO2 30 29  GLUCOSE 163* 179*  BUN 56* 60*  CREATININE 2.22* 2.36*  CALCIUM 9.0 9.1    Medications: I have reviewed the patient's current medications. Scheduled:  amiodarone  200 mg Oral BID   apixaban  5 mg Oral BID   cephALEXin  250 mg Oral Q8H   Chlorhexidine Gluconate Cloth  6 each Topical Daily   fluticasone furoate-vilanterol  1 puff Inhalation Daily   hydrALAZINE  37.5 mg Oral Q8H   insulin aspart  0-5 Units Subcutaneous QHS   insulin aspart  0-9 Units Subcutaneous TID WC   insulin aspart  6 Units Subcutaneous TID WC   insulin glargine-yfgn  35 Units Subcutaneous Daily   isosorbide mononitrate  30 mg Oral Daily   linaclotide  72 mcg Oral q morning   pantoprazole  40 mg Oral  Daily   potassium chloride  20 mEq Oral Daily   rosuvastatin  20 mg Oral QHS   sodium chloride flush  10-40 mL Intracatheter Q12H   sodium chloride flush  3 mL Intravenous Q12H   torsemide  20 mg Oral Daily   umeclidinium bromide  1 puff Inhalation Daily   Continuous:  sodium chloride     ZOX:[id number], ALPRAZolam, ipratropium-albuterol, mouth rinse, phenol, sodium chloride flush, traZODone  Assessment/Plan: Severe left epistaxis last week. -Packing removed without difficulty.  No active bleeding. -Afrin nasal spray as needed if he develops more bleeding. -No other intervention is recommended at this time.   LOS: 12 days   Oniya Mandarino W Gentle Hoge 02/23/2023, 8:01 AM

## 2023-02-23 NOTE — H&P (View-Only) (Signed)
Patient ID: [name], male   DOB: 06/29/1946, 77 y.o.   MRN: [id number]     Advanced Heart Failure Rounding Note  PCP-Cardiologist: Philip Nahser, MD   Subjective:    - 4/20 while on dobutamine patient went into atrial flutter with RVR, became hypotensive.  Decision made to start amiodarone and transferred to CVICU. - 4/22 CO-OX marginal. Milrinone increased to 0.25 mcg. Diuresed with IV lasix. - 4/24 RHC with elevated filling pressures, transitioned to Lasix gtt at 12 mg per hour.  - 4/25 Epistaxis requiring packing by ENT. Went back into AFL   Remains in AFL, V-rates 90s   Off Milrinone, Co-ox 55%, CVP 5   SCr 2.44>>2.55>>2.29>>2.22>>2.36 today   Nose packing removed. No further bleeding.  OOB, sitting up in chair. Feels well. No current dyspnea. Denies CP.   4/24 RHC Procedural Findings (milrinone 0.25): Hemodynamics (mmHg) RA mean 12 RV 55/16 PA 57/22, mean 34 PCWP mean 24, v-waves to 35Oxygen saturations: PA 51% AO 86% Cardiac Output (Fick) 4.71  Cardiac Index (Fick) 2.37 PVR 2.1 WU    Objective:   Weight Range: 77.4 kg Body mass index is 24.48 kg/m.   Vital Signs:   Temp:  [97.6 F (36.4 C)-99.4 F (37.4 C)] 99.4 F (37.4 C) (04/29 0700) Pulse Rate:  [70-100] 95 (04/29 0800) Resp:  [10-28] 18 (04/29 0800) BP: (98-144)/(51-82) 117/81 (04/29 0800) SpO2:  [80 %-98 %] 93 % (04/29 0800) Weight:  [77.4 kg] 77.4 kg (04/29 0620) Last BM Date : 02/22/23  Weight change: Filed Weights   02/21/23 0600 02/22/23 0530 02/23/23 0620  Weight: 76.7 kg 77.5 kg 77.4 kg    Intake/Output:   Intake/Output Summary (Last 24 hours) at 02/23/2023 0948 Last data filed at 02/23/2023 0830 Gross per 24 hour  Intake 620 ml  Output 2100 ml  Net -1480 ml    Physical Exam   CVP 5 General:  Well appearing. No respiratory difficulty HEENT: normal Neck: supple. no JVD. Carotids 2+ bilat; no bruits. No lymphadenopathy or thyromegaly appreciated. Cor: PMI nondisplaced.  Irregularly irregular rhythm and rate. No rubs, gallops or murmurs. Lungs: clear Abdomen: soft, nontender, nondistended. No hepatosplenomegaly. No bruits or masses. Good bowel sounds. Extremities: no cyanosis, clubbing, rash, 1+ b/l ankle and pedal edema + RUE PICC  Neuro: alert & oriented x 3, cranial nerves grossly intact. moves all 4 extremities w/o difficulty. Affect pleasant.    Telemetry   Atrial flutter rate 90s   EKG    No new EKG to review  Labs    CBC Recent Labs    02/22/23 0042 02/23/23 0424  WBC 6.4 6.5  HGB 11.6* 11.3*  HCT 37.0* 36.9*  MCV 86.0 87.4  PLT 189 200    Basic Metabolic Panel Recent Labs    02/22/23 0042 02/23/23 0424  NA 132* 134*  K 4.0 3.9  CL 93* 94*  CO2 30 29  GLUCOSE 163* 179*  BUN 56* 60*  CREATININE 2.22* 2.36*  CALCIUM 9.0 9.1  MG 2.1  --    BNP (last 3 results) Recent Labs    01/20/23 1357 02/02/23 1130 02/10/23 1302  BNP 1,939.2* 2,275.8* 2,508.7*      Imaging    No results found.   Medications:     Scheduled Medications:  amiodarone  200 mg Oral BID   apixaban  5 mg Oral BID   cephALEXin  250 mg Oral Q8H   Chlorhexidine Gluconate Cloth  6 each Topical Daily   fluticasone   furoate-vilanterol  1 puff Inhalation Daily   hydrALAZINE  37.5 mg Oral Q8H   insulin aspart  0-5 Units Subcutaneous QHS   insulin aspart  0-9 Units Subcutaneous TID WC   insulin aspart  6 Units Subcutaneous TID WC   insulin glargine-yfgn  35 Units Subcutaneous Daily   isosorbide mononitrate  30 mg Oral Daily   linaclotide  72 mcg Oral q morning   pantoprazole  40 mg Oral Daily   potassium chloride  20 mEq Oral Daily   rosuvastatin  20 mg Oral QHS   sodium chloride flush  10-40 mL Intracatheter Q12H   sodium chloride flush  3 mL Intravenous Q12H   torsemide  20 mg Oral Daily   umeclidinium bromide  1 puff Inhalation Daily    Infusions:  sodium chloride       PRN Medications: acetaminophen, ALPRAZolam,  ipratropium-albuterol, mouth rinse, phenol, sodium chloride flush, traZODone   Patient Profile   [name] is a 77 y.o. male with CAD c/p CABG 2003, HFrEF, chronic DOE, Rt CEA >18yrs ago, Lt CEA 14', HTN, DM2, HLD, PAD w/ Rt SFA and Rt politeal artery vascular stents 15', prostate cancer. Admitted with a/c HFrEF.  Assessment/Plan   1. Acute on chronic systolic CHF: Ischemic cardiomyopathy. Echo in 3/24 with EF 35-40%, RV normal. Echo this admission with EF 30%, moderately decreased RV dysfunction with mild RVE, PASP 63 mmHg, IVC dilated. Low output HF this admission, has required inotrope. RHC today showed ongoing volume overload with stable cardiac output on milrinone 0.25. Prominent v-waves but only mild MR on echo (likely due to volume overload/diastolic dysfunction). GDMT limited by hypotension/AKI. Now off milrinone, Co-ox 55% today (up from 50%). CVP 5. SCr  2.2>>2.4  - Continue torsemide 20 mg daily.  - Will leave off milrinone and plan DCCV today  - continue hydralazine 37.5 mg tid and continue Imdur 30 daily.   - additional GDMT limited by renal fx  2.  Atrial flutter: Rate controlled.  Amiodarone IV stopped 4/27 with prolonged QTc and po started.  QTc was shorter on telemetry.  -continue po amiodarone 200 mg bid. Repeat 12 lead to look at QTc  - Plan for TEE-guided DCCV today  - Continue Eliquis.  3. CAD: Status post CABG 2003.  No ACS this admission. No chest pain.  - Continue Crestor.  - No coronary angiography with no ACS and AKI.  4.Carotid Disease: S/p b/l CEA - continue statin 5. PAD: s/p intervention 2015 Rt SFA and Rt politeal artery vascular stents - continue statin  6. Type 2 DM: Per [facility].   7. COPD/emphysema: Uses 2 L home O2 as needed, requiring oxygen here.  8. AKI on CKD stage 4: Baseline creatinine ~ 2.2.  Creatinine 2.8>3>2.75>2.59>>2.44>>2.5>>2.29>>2.22>2.4.  - diuretics per above, follow BMP  - follow co-ox off milrinone  9. Epistaxis: 4/24 Transexamic used +  Afrin.  Nasal packing 4/25, removed 4/29. No further visible bleeding. Hgb stable.  - monitor closely w/ Eliquis restart - appreciate ENT    Length of Stay: 12  Brittainy Simmons, PA-C  02/23/2023, 9:48 AM  Advanced Heart Failure Team Pager [phone] (M-F; 7a - 5p)  Please contact [facility] for night-coverage after hours (5p -7a ) and weekends on [email or web address]  Patient seen and examined with the above-signed Advanced Practice Provider and/or Housestaff. I personally reviewed laboratory data, imaging studies and relevant notes. I independently examined the patient and formulated the important aspects of the plan. I have edited the note   to reflect any of my changes or salient points. I have personally discussed the plan with the patient and/or family.  Remains in AF. Co-ox stable on milrinone. CVP 5. SCr 2.4.   Denies CP or SOB.  General:  Sitting in chair  No resp difficulty HEENT: normal Neck: supple. no JVD. Carotids 2+ bilat; no bruits. No lymphadenopathy or thryomegaly appreciated. Cor: PMI nondisplaced. irregular rate & rhythm. No rubs, gallops or murmurs. Lungs: clear Abdomen: soft, nontender, nondistended. No hepatosplenomegaly. No bruits or masses. Good bowel sounds. Extremities: no cyanosis, clubbing, rash, edema Neuro: alert & orientedx3, cranial nerves grossly intact. moves all 4 extremities w/o difficulty. Affect pleasant  Will plan TEE and DC-CV today. Continue Eliquis and po amio.   Kamylah Manzo, MD  10:56 AM  

## 2023-02-23 NOTE — Progress Notes (Signed)
PROGRESS NOTE    [name]  ZOX:[id number] DOB: [birth year] DOA: 02/10/2023 PCP: Gaspar Garbe, MD  77/M w chronic systolic CHF, T2DM, CKD, COPD chronic hypoxemic respiratory failure and hypertension who presented with lower extremity edema. Recent hospitalization 3/26-3/30 for CHF. Cardiology outpatient follow up he had gain 5 lbs from the time of his discharge. At home he continue to have worsening edema, and weight gain for 9 lbs more. In the ED, + edema and rales, labs w/ cr 2,32, BNP 2,508, CXR noted cardiomegaly, bilateral hilar vascular congestion, with bilateral interstitial infiltrates, small bilateral pleural effusions, -Admitted, started on diuretics -4/19 -started on dobutamine infusion.  -4/20 developed VT TX to ICU -4/21: CVP 14, diuresing, continued amio,  Started milrinone -4/24: RHC with elevated filling pressures, wedge of 24, cardiac output 4.7 on milrinone -4/24: Recurrent epistaxis, treated with Afrin and tranexamic acid, Eliquis held -4/26: Persistent epistaxis ENT consulted anterior/posterior nasal packing performed by Dr. Suszanne Conners -4/27, amiodarone changed to p.o.  Subjective: -Feels okay, no events overnight, nasal packing removed this morning  Assessment and Plan:  Acute on chronic systolic CHF (congestive heart failure) Acute hypoxic respiratory failure -Echo with EF 35 to 40%, no LVH, RV systolic function preserved,  -Admitted with low output heart failure.  -CHF team following, diuresed well on milrinone and Lasix drip, down 26 pounds, creatinine stable -Remains stable and euvolemic, off milrinone, continue torsemide -Continue Imdur and hydralazine -Discharge planning, home tomorrow if stable  AKI on CKD3b -baseline creat around 2.2 -Creatinine improving, creatinine stabilizing, now 2 3, continue torsemide  Persistent Atrial flutter -Rate improving, was on IV amiodarone, changed to p.o. -Continue apixaban, plan for TEE/DCCV today  Severe  epistaxis -Since 4/24 evening, Rx w/Afrin and TXA  -ENT consulted, completed anterior/posterior nasal packing by Dr. Suszanne Conners 4/25, appreciate input, resume Eliquis,  -Nasal packing removed, will discontinue antibiotics -Now resolved  CAD CABG -ischemic workup deferred w/ CKD   PAD - s/p intervention 2015 Rt SFA and Rt politeal artery vascular stents, continue statin   GERD (gastroesophageal reflux disease) -Continue protonix 40 mg daily.  Type 2 diabetes mellitus with hyperlipidemia -Uncontrolled T2DM with hyperglycemia.  -Improved  COPD (chronic obstructive pulmonary disease) -On 2 L home O2 -Stable.   DNR (do not resuscitate)  DVT prophylaxis: SCDs Code Status: DNR Family Communication: None present Disposition Plan: Home tomorrow  Consultants: CHF team   Procedures:   Antimicrobials:    Objective: Vitals:   02/23/23 0934 02/23/23 1000 02/23/23 1100 02/23/23 1115  BP: 107/61 (!) 121/59 (!) 112/43   Pulse: 88 98 71 78  Resp: 20 20 19  (!) 28  Temp:    99.1 F (37.3 C)  TempSrc:    Oral  SpO2: 96% 95% 96% 98%  Weight:      Height:        Intake/Output Summary (Last 24 hours) at 02/23/2023 1154 Last data filed at 02/23/2023 1000 Gross per 24 hour  Intake 260 ml  Output 2075 ml  Net -1815 ml   Filed Weights   02/21/23 0600 02/22/23 0530 02/23/23 0620  Weight: 76.7 kg 77.5 kg 77.4 kg    Examination:  Pleasant chronically ill male sitting up in bed, AAOx3, no distress HEENT: No JVD CVS: S1-S2, irregular rhythm Lungs: Decreased breath sounds to bases Abdomen: Soft, nontender, bowel sounds present Extremities: No edema Psychiatry:  Mood & affect appropriate.     Data Reviewed:   CBC: Recent Labs  Lab 02/19/23 1712 02/20/23 8119 02/21/23 0431  02/22/23 0042 02/23/23 0424  WBC 5.2 5.1 6.1 6.4 6.5  HGB 11.5* 11.2* 11.3* 11.6* 11.3*  HCT 36.0* 35.0* 36.9* 37.0* 36.9*  MCV 85.9 86.0 87.4 86.0 87.4  PLT 159 159 177 189 200   Basic Metabolic  Panel: Recent Labs  Lab 02/17/23 0358 02/18/23 0512 02/19/23 0443 02/20/23 0337 02/21/23 0431 02/22/23 0042 02/23/23 0424  NA 127*   < > 132* 131* 131* 132* 134*  K 3.3*   < > 3.9 3.5 3.6 4.0 3.9  CL 87*   < > 88* 86* 92* 93* 94*  CO2 26   < > 31 29 30 30 29   GLUCOSE 268*   < > 178* 328* 215* 163* 179*  BUN 64*   < > 62* 61* 54* 56* 60*  CREATININE 2.75*   < > 2.44* 2.55* 2.29* 2.22* 2.36*  CALCIUM 8.3*   < > 9.0 9.1 8.8* 9.0 9.1  MG 2.2  --   --   --   --  2.1  --    < > = values in this interval not displayed.   GFR: Estimated Creatinine Clearance: 27.9 mL/min (A) (by C-G formula based on SCr of 2.36 mg/dL (H)). Liver Function Tests: No results for input(s): "AST", "ALT", "ALKPHOS", "BILITOT", "PROT", "ALBUMIN" in the last 168 hours.  No results for input(s): "LIPASE", "AMYLASE" in the last 168 hours. No results for input(s): "AMMONIA" in the last 168 hours. Coagulation Profile: Recent Labs  Lab 02/18/23 2057 02/23/23 0424  INR 1.9* 2.1*   Cardiac Enzymes: No results for input(s): "CKTOTAL", "CKMB", "CKMBINDEX", "TROPONINI" in the last 168 hours. BNP (last 3 results) No results for input(s): "PROBNP" in the last 8760 hours. HbA1C: No results for input(s): "HGBA1C" in the last 72 hours. CBG: Recent Labs  Lab 02/22/23 1635 02/22/23 2126 02/23/23 0628 02/23/23 0735 02/23/23 1114  GLUCAP 150* 166* 153* 150* 131*   Lipid Profile: No results for input(s): "CHOL", "HDL", "LDLCALC", "TRIG", "CHOLHDL", "LDLDIRECT" in the last 72 hours. Thyroid Function Tests: No results for input(s): "TSH", "T4TOTAL", "FREET4", "T3FREE", "THYROIDAB" in the last 72 hours. Anemia Panel: No results for input(s): "VITAMINB12", "FOLATE", "FERRITIN", "TIBC", "IRON", "RETICCTPCT" in the last 72 hours. Urine analysis:    Component Value Date/Time   COLORURINE STRAW (A) 10/19/2022 0628   APPEARANCEUR CLEAR 10/19/2022 0628   LABSPEC 1.009 10/19/2022 0628   PHURINE 5.0 10/19/2022 0628    GLUCOSEU >=500 (A) 10/19/2022 0628   HGBUR NEGATIVE 10/19/2022 0628   BILIRUBINUR NEGATIVE 10/19/2022 0628   KETONESUR NEGATIVE 10/19/2022 0628   PROTEINUR NEGATIVE 10/19/2022 0628   UROBILINOGEN 1.0 07/19/2013 1223   NITRITE NEGATIVE 10/19/2022 0628   LEUKOCYTESUR NEGATIVE 10/19/2022 0628   Sepsis Labs: @LABRCNTIP (procalcitonin:4,lacticidven:4)  ) Recent Results (from the past 240 hour(s))  MRSA Next Gen by PCR, Nasal     Status: None   Collection Time: 02/14/23  2:46 PM   Specimen: Nasal Mucosa; Nasal Swab  Result Value Ref Range Status   MRSA by PCR Next Gen NOT DETECTED NOT DETECTED Final    Comment: (NOTE) The GeneXpert MRSA Assay (FDA approved for NASAL specimens only), is one component of a comprehensive MRSA colonization surveillance program. It is not intended to diagnose MRSA infection nor to guide or monitor treatment for MRSA infections. Test performance is not FDA approved in patients less than 39 years old. Performed at [facility], [address], [place], [place] [address]      Radiology Studies: No results found.  Scheduled Meds:  amiodarone  200 mg Oral BID   apixaban  5 mg Oral BID   Chlorhexidine Gluconate Cloth  6 each Topical Daily   fluticasone furoate-vilanterol  1 puff Inhalation Daily   hydrALAZINE  37.5 mg Oral Q8H   insulin aspart  0-5 Units Subcutaneous QHS   insulin aspart  0-9 Units Subcutaneous TID WC   insulin aspart  6 Units Subcutaneous TID WC   insulin glargine-yfgn  35 Units Subcutaneous Daily   isosorbide mononitrate  30 mg Oral Daily   linaclotide  72 mcg Oral q morning   pantoprazole  40 mg Oral Daily   potassium chloride  20 mEq Oral Daily   rosuvastatin  20 mg Oral QHS   sodium chloride flush  10-40 mL Intracatheter Q12H   sodium chloride flush  3 mL Intravenous Q12H   torsemide  20 mg Oral Daily   umeclidinium bromide  1 puff Inhalation Daily   Continuous Infusions:  sodium chloride        LOS: 12 days     Time spent:    Zannie Cove, MD [facility]   02/23/2023, 11:54 AM

## 2023-02-23 NOTE — TOC Progression Note (Signed)
Transition of Care [facility]) - Progression Note    Patient Details  Name: [name] MRN: [id number] Date of Birth: 04/10/1946  [facility]) CM/SW Contact  Elliot Cousin, RN Phone Number: [phone] 02/23/2023, 4:22 PM   Clinical Narrative:   HF TOC CM spoke to dtr, [name] at bedside. Offered choice for [facility]. Pt may need RW for home. Will wait until patient is back in room to discuss needed DME.     Expected Discharge Plan: Home w Home Health Services Barriers to Discharge: Continued Medical Work up  Expected Discharge Plan and Services   Discharge Planning Services: CM Consult   Living arrangements for the past 2 months: Single Family Home                                       Social Determinants of Health (SDOH) Interventions SDOH Screenings   Food Insecurity: No Food Insecurity (01/21/2023)  Housing: Low Risk  (01/21/2023)  Transportation Needs: No Transportation Needs (01/21/2023)  Utilities: Not At Risk (01/21/2023)  Tobacco Use: Medium Risk (02/18/2023)    Readmission Risk Interventions    01/21/2023    4:21 PM  Readmission Risk Prevention Plan  Transportation Screening Complete  Medication Review (RN Care Manager) Complete  HRI or Home Care Consult Complete  Palliative Care Screening Not Applicable  Skilled Nursing Facility Not Applicable

## 2023-02-23 NOTE — Progress Notes (Signed)
  Echocardiogram Echocardiogram Transesophageal has been performed.  Delcie Roch 02/23/2023, 3:04 PM

## 2023-02-23 NOTE — Progress Notes (Addendum)
Patient ID: LADARRELL CORNWALL, male   DOB: 09-03-1946, 77 y.o.   MRN: [id number]     Advanced Heart Failure Rounding Note  PCP-Cardiologist: Kristeen Miss, MD   Subjective:    - 4/20 while on dobutamine patient went into atrial flutter with RVR, became hypotensive.  Decision made to start amiodarone and transferred to CVICU. - 4/22 CO-OX marginal. Milrinone increased to 0.25 mcg. Diuresed with IV lasix. - 4/24 RHC with elevated filling pressures, transitioned to Lasix gtt at 12 mg per hour.  - 4/25 Epistaxis requiring packing by ENT. Went back into AFL   Remains in AFL, V-rates 90s   Off Milrinone, Co-ox 55%, CVP 5   SCr 2.44>>2.55>>2.29>>2.22>>2.36 today   Nose packing removed. No further bleeding.  OOB, sitting up in chair. Feels well. No current dyspnea. Denies CP.   4/24 RHC Procedural Findings (milrinone 0.25): Hemodynamics (mmHg) RA mean 12 RV 55/16 PA 57/22, mean 34 PCWP mean 24, v-waves to 35Oxygen saturations: PA 51% AO 86% Cardiac Output (Fick) 4.71  Cardiac Index (Fick) 2.37 PVR 2.1 WU    Objective:   Weight Range: 77.4 kg Body mass index is 24.48 kg/m.   Vital Signs:   Temp:  [97.6 F (36.4 C)-99.4 F (37.4 C)] 99.4 F (37.4 C) (04/29 0700) Pulse Rate:  [70-100] 95 (04/29 0800) Resp:  [10-28] 18 (04/29 0800) BP: (98-144)/(51-82) 117/81 (04/29 0800) SpO2:  [80 %-98 %] 93 % (04/29 0800) Weight:  [77.4 kg] 77.4 kg (04/29 0620) Last BM Date : 02/22/23  Weight change: Filed Weights   02/21/23 0600 02/22/23 0530 02/23/23 0620  Weight: 76.7 kg 77.5 kg 77.4 kg    Intake/Output:   Intake/Output Summary (Last 24 hours) at 02/23/2023 0948 Last data filed at 02/23/2023 0830 Gross per 24 hour  Intake 620 ml  Output 2100 ml  Net -1480 ml    Physical Exam   CVP 5 General:  Well appearing. No respiratory difficulty HEENT: normal Neck: supple. no JVD. Carotids 2+ bilat; no bruits. No lymphadenopathy or thyromegaly appreciated. Cor: PMI nondisplaced.  Irregularly irregular rhythm and rate. No rubs, gallops or murmurs. Lungs: clear Abdomen: soft, nontender, nondistended. No hepatosplenomegaly. No bruits or masses. Good bowel sounds. Extremities: no cyanosis, clubbing, rash, 1+ b/l ankle and pedal edema + RUE PICC  Neuro: alert & oriented x 3, cranial nerves grossly intact. moves all 4 extremities w/o difficulty. Affect pleasant.    Telemetry   Atrial flutter rate 90s   EKG    No new EKG to review  Labs    CBC Recent Labs    02/22/23 0042 02/23/23 0424  WBC 6.4 6.5  HGB 11.6* 11.3*  HCT 37.0* 36.9*  MCV 86.0 87.4  PLT 189 200    Basic Metabolic Panel Recent Labs    11/91/47 0042 02/23/23 0424  NA 132* 134*  K 4.0 3.9  CL 93* 94*  CO2 30 29  GLUCOSE 163* 179*  BUN 56* 60*  CREATININE 2.22* 2.36*  CALCIUM 9.0 9.1  MG 2.1  --    BNP (last 3 results) Recent Labs    01/20/23 1357 02/02/23 1130 02/10/23 1302  BNP 1,939.2* 2,275.8* 2,508.7*      Imaging    No results found.   Medications:     Scheduled Medications:  amiodarone  200 mg Oral BID   apixaban  5 mg Oral BID   cephALEXin  250 mg Oral Q8H   Chlorhexidine Gluconate Cloth  6 each Topical Daily   fluticasone  furoate-vilanterol  1 puff Inhalation Daily   hydrALAZINE  37.5 mg Oral Q8H   insulin aspart  0-5 Units Subcutaneous QHS   insulin aspart  0-9 Units Subcutaneous TID WC   insulin aspart  6 Units Subcutaneous TID WC   insulin glargine-yfgn  35 Units Subcutaneous Daily   isosorbide mononitrate  30 mg Oral Daily   linaclotide  72 mcg Oral q morning   pantoprazole  40 mg Oral Daily   potassium chloride  20 mEq Oral Daily   rosuvastatin  20 mg Oral QHS   sodium chloride flush  10-40 mL Intracatheter Q12H   sodium chloride flush  3 mL Intravenous Q12H   torsemide  20 mg Oral Daily   umeclidinium bromide  1 puff Inhalation Daily    Infusions:  sodium chloride       PRN Medications: acetaminophen, ALPRAZolam,  ipratropium-albuterol, mouth rinse, phenol, sodium chloride flush, traZODone   Patient Profile   Mr. Anna is a 76 y.o. male with CAD c/p CABG 2003, HFrEF, chronic DOE, Rt CEA >52yrs ago, Lt CEA 14', HTN, DM2, HLD, PAD w/ Rt SFA and Rt politeal artery vascular stents 15', prostate cancer. Admitted with a/c HFrEF.  Assessment/Plan   1. Acute on chronic systolic CHF: Ischemic cardiomyopathy. Echo in 3/24 with EF 35-40%, RV normal. Echo this admission with EF 30%, moderately decreased RV dysfunction with mild RVE, PASP 63 mmHg, IVC dilated. Low output HF this admission, has required inotrope. RHC today showed ongoing volume overload with stable cardiac output on milrinone 0.25. Prominent v-waves but only mild MR on echo (likely due to volume overload/diastolic dysfunction). GDMT limited by hypotension/AKI. Now off milrinone, Co-ox 55% today (up from 50%). CVP 5. SCr  2.2>>2.4  - Continue torsemide 20 mg daily.  - Will leave off milrinone and plan DCCV today  - continue hydralazine 37.5 mg tid and continue Imdur 30 daily.   - additional GDMT limited by renal fx  2.  Atrial flutter: Rate controlled.  Amiodarone IV stopped 4/27 with prolonged QTc and po started.  QTc was shorter on telemetry.  -continue po amiodarone 200 mg bid. Repeat 12 lead to look at QTc  - Plan for TEE-guided DCCV today  - Continue Eliquis.  3. CAD: Status post CABG 2003.  No ACS this admission. No chest pain.  - Continue Crestor.  - No coronary angiography with no ACS and AKI.  4.Carotid Disease: S/p b/l CEA - continue statin 5. PAD: s/p intervention 2015 Rt SFA and Rt politeal artery vascular stents - continue statin  6. Type 2 DM: Per [facility].   7. COPD/emphysema: Uses 2 L home O2 as needed, requiring oxygen here.  8. AKI on CKD stage 4: Baseline creatinine ~ 2.2.  Creatinine 2.8>3>2.75>2.59>>2.44>>2.5>>2.29>>2.22>2.4.  - diuretics per above, follow BMP  - follow co-ox off milrinone  9. Epistaxis: 4/24 Transexamic used +  Afrin.  Nasal packing 4/25, removed 4/29. No further visible bleeding. Hgb stable.  - monitor closely w/ Eliquis restart - appreciate ENT    Length of Stay: [address], PA-C  02/23/2023, 9:48 AM  Advanced Heart Failure Team Pager [phone] (M-F; 7a - 5p)  Please contact [facility] for night-coverage after hours (5p -7a ) and weekends on [email or web address]  Patient seen and examined with the above-signed Advanced Practice Provider and/or Housestaff. I personally reviewed laboratory data, imaging studies and relevant notes. I independently examined the patient and formulated the important aspects of the plan. I have edited the note  to reflect any of my changes or salient points. I have personally discussed the plan with the patient and/or family.  Remains in AF. Co-ox stable on milrinone. CVP 5. SCr 2.4.   Denies CP or SOB.  General:  Sitting in chair  No resp difficulty HEENT: normal Neck: supple. no JVD. Carotids 2+ bilat; no bruits. No lymphadenopathy or thryomegaly appreciated. Cor: PMI nondisplaced. irregular rate & rhythm. No rubs, gallops or murmurs. Lungs: clear Abdomen: soft, nontender, nondistended. No hepatosplenomegaly. No bruits or masses. Good bowel sounds. Extremities: no cyanosis, clubbing, rash, edema Neuro: alert & orientedx3, cranial nerves grossly intact. moves all 4 extremities w/o difficulty. Affect pleasant  Will plan TEE and DC-CV today. Continue Eliquis and po amio.   Arvilla Meres, MD  10:56 AM

## 2023-02-23 NOTE — Interval H&P Note (Signed)
History and Physical Interval Note:  02/23/2023 2:28 PM  [name]  has presented today for surgery, with the diagnosis of afib.  The various methods of treatment have been discussed with the patient and family. After consideration of risks, benefits and other options for treatment, the patient has consented to  Procedure(s): TRANSESOPHAGEAL ECHOCARDIOGRAM (N/A) CARDIOVERSION (N/A) as a surgical intervention.  The patient's history has been reviewed, patient examined, no change in status, stable for surgery.  I have reviewed the patient's chart and labs.  Questions were answered to the patient's satisfaction.     Liisa Picone

## 2023-02-23 NOTE — Transfer of Care (Signed)
Immediate Anesthesia Transfer of Care Note  Patient: [name]  Procedure(s) Performed: TRANSESOPHAGEAL ECHOCARDIOGRAM CARDIOVERSION  Patient Location: Cath Lab  Anesthesia Type:MAC  Level of Consciousness: sedated  Airway & Oxygen Therapy: Patient Spontanous Breathing and Patient connected to face mask oxygen  Post-op Assessment: Report given to RN and Post -op Vital signs reviewed and stable  Post vital signs: Reviewed and stable  Last Vitals:  Vitals Value Taken Time  BP 103/48 02/23/23 1502  Temp 36.7 C 02/23/23 1502  Pulse 66 02/23/23 1504  Resp 21 02/23/23 1502  SpO2 90 % 02/23/23 1504  Vitals shown include unvalidated device data.  Last Pain:  Vitals:   02/23/23 1502  TempSrc: Temporal  PainSc: 0-No pain      Patients Stated Pain Goal: 0 (02/21/23 1600)  Complications: No notable events documented.

## 2023-02-23 NOTE — CV Procedure (Signed)
   TRANSESOPHAGEAL ECHOCARDIOGRAM GUIDED DIRECT CURRENT CARDIOVERSION  NAME:  [name]   MRN: [id number] DOB:  [birth year]   ADMIT DATE: 02/10/2023  INDICATIONS:  Atrial fibrillation  PROCEDURE:   Informed consent was obtained prior to the procedure. The risks, benefits and alternatives for the procedure were discussed and the patient comprehended these risks.  Risks include, but are not limited to, cough, sore throat, vomiting, nausea, somnolence, esophageal and stomach trauma or perforation, bleeding, low blood pressure, aspiration, pneumonia, infection, trauma to the teeth and death.    After a procedural time-out, the oropharynx was anesthetized and the patient was sedated by the anesthesia service. The transesophageal probe was inserted in the esophagus and stomach without difficulty and multiple views were obtained.   FINDINGS:  LEFT VENTRICLE: EF = 25%  RIGHT VENTRICLE: Moderately HK  LEFT ATRIUM: Severely dilated 5.2cm  LEFT ATRIAL APPENDAGE: + smoke. No formed clot  RIGHT ATRIUM: Moderately dilated  AORTIC VALVE:  [place]. Moderately calcified Moderate AI  MITRAL VALVE:    Mild MR  TRICUSPID VALVE: Mild TR  PULMONIC VALVE: Trivial PI  INTERATRIAL SEPTUM: No PFO/ASD  PERICARDIUM: No effusion  DESCENDING AORTA: Severe plaque   CARDIOVERSION:     Indications:  Atrial Fibrillation  Procedure Details:  Once the TEE was complete, the patient had the defibrillator pads placed in the anterior and posterior position. Once an appropriate level of sedation was achieved, the patient received a single biphasic, synchronized 200J shock with prompt conversion to sinus rhythm. No apparent complications.   Arvilla Meres, MD  2:58 PM

## 2023-02-24 ENCOUNTER — Encounter (HOSPITAL_COMMUNITY): Payer: Self-pay | Admitting: Internal Medicine

## 2023-02-24 DIAGNOSIS — I5023 Acute on chronic systolic (congestive) heart failure: Secondary | ICD-10-CM | POA: Diagnosis not present

## 2023-02-24 LAB — GLUCOSE, CAPILLARY
Glucose-Capillary: 86 mg/dL (ref 70–99)
Glucose-Capillary: 113 mg/dL — ABNORMAL HIGH (ref 70–99)
Glucose-Capillary: 215 mg/dL — ABNORMAL HIGH (ref 70–99)
Glucose-Capillary: 81 mg/dL (ref 70–99)
Glucose-Capillary: 275 mg/dL — ABNORMAL HIGH (ref 70–99)

## 2023-02-24 LAB — BASIC METABOLIC PANEL
Anion gap: 12 (ref 5–15)
BUN: 71 mg/dL — ABNORMAL HIGH (ref 8–23)
CO2: 26 mmol/L (ref 22–32)
Calcium: 9.2 mg/dL (ref 8.9–10.3)
Chloride: 97 mmol/L — ABNORMAL LOW (ref 98–111)
Creatinine, Ser: 2.4 mg/dL — ABNORMAL HIGH (ref 0.61–1.24)
GFR, Estimated: 27 mL/min — ABNORMAL LOW (ref >60)
Glucose, Bld: 103 mg/dL — ABNORMAL HIGH (ref 70–99)
Potassium: 4.2 mmol/L (ref 3.5–5.1)
Sodium: 135 mmol/L (ref 135–145)

## 2023-02-24 LAB — COOXEMETRY PANEL
Carboxyhemoglobin: 1.7 % — ABNORMAL HIGH (ref 0.5–1.5)
Carboxyhemoglobin: 1.7 % — ABNORMAL HIGH (ref 0.5–1.5)
Methemoglobin: <0.7 % (ref 0.0–1.5)
Methemoglobin: <0.7 % (ref 0.0–1.5)
O2 Saturation: 47.4 %
O2 Saturation: 54.1 %
Total hemoglobin: 12.2 g/dL (ref 12.0–16.0)
Total hemoglobin: 12 g/dL (ref 12.0–16.0)

## 2023-02-24 LAB — CBC
HCT: 37.9 % — ABNORMAL LOW (ref 39.0–52.0)
Hemoglobin: 11.9 g/dL — ABNORMAL LOW (ref 13.0–17.0)
MCH: 27.2 pg (ref 26.0–34.0)
MCHC: 31.4 g/dL (ref 30.0–36.0)
MCV: 86.5 fL (ref 80.0–100.0)
Platelets: 221 10*3/uL (ref 150–400)
RBC: 4.38 MIL/uL (ref 4.22–5.81)
RDW: 18.5 % — ABNORMAL HIGH (ref 11.5–15.5)
WBC: 7.4 10*3/uL (ref 4.0–10.5)
nRBC: 0 % (ref 0.0–0.2)

## 2023-02-24 MED ORDER — FUROSEMIDE 10 MG/ML IJ SOLN
60.0000 mg | Freq: Once | INTRAMUSCULAR | Status: AC
  Administered 2023-02-24: 60 mg via INTRAVENOUS
  Filled 2023-02-24: qty 6

## 2023-02-24 NOTE — Progress Notes (Signed)
Heart and Vascular Care Navigation  02/24/2023  [name] 1946/07/19 [id number]  Reason for Referral: Outpatient Heart Failure Clinic CSW met with pt to discuss Community Paramedicine referral   Engaged with patient face to face for initial visit for Heart and Vascular Care Coordination.                                                                                                   Assessment: Pt is agreeable to referral to [employer] team to assist with managing medical needs at home.  Paramedicine Initial Assessment:  Housing:  In what kind of housing do you live? House/apt/trailer/shelter? house  Do you live with anyone? wife  Are you currently worried about losing your housing? no  Social:  What is your current marital status? married  Do you have any children? A son and a daughter  Do you have family or friends who live locally? Daughter is an Charity fundraiser and lives locally - she works from home and can be available to help as needed- is putting an addition on her house for her parents to move into when needed.  Son lives in [place] and is a Teacher, early years/pre but is very supportive and talks to pt daily.  Reports several grandchildren who all live in this area and are supportive.  Food:  Not worried about obtaining food financially but states that he is worried about what will happen when he goes home as far as preparing food.  He was the primary cook at home as wife has health concerns and isn't able to cook any longer.  He says his dtr plans to bring food for them but doesn't want to put her out.  Discussed moms meals referral but he has had this before and isn't interested at this time.  Insurance:  Are you currently insured? A[employer] Medicare  Do you have prescription coverage? Yes- most meds are affordable- gets insulin through the [place] but that's all he uses them for- states he can afford his other meds and gets assistance with farxiga   Transportation:  Do you have  transportation to your medical appointments? Normally drives himself- feels like he has sufficient support to get to appts if he isn't able to at first.   Daily Health Needs: Do you have a working scale at home? yes  Do you have any concerns with mobility at home? Worried what his mobility will be like when he returns home but is normally independent  Do you use any assistive devices at home or have PCS at home? no  Do you have a PCP? Richard [name]   Do you have any trouble reading or writing? no  Do you currently use tobacco products or have recently quit? Not assessed  Are there any additional barriers you see to getting the care you need? Not at this time                                   HRT/VAS Care Coordination     Patients  Home Cardiology Office Heart Failure Clinic   Outpatient Care Team Community Paramedicine; Social Worker   Social Worker Name: Rosetta Posner, Advanced HF clinic, [phone]   Living arrangements for the past 2 months Single Family Home   Lives with: Spouse   Patient Current Insurance Coverage Managed Medicare   Patient Has Concern With Paying Medical Bills No   Does Patient Have Prescription Coverage? Yes   Home Assistive Devices/Equipment CBG Meter   DME Agency NA   Current home services DME  oxygen-[employer], scale       Social History:                                                                             SDOH Screenings   Food Insecurity: No Food Insecurity (02/24/2023)  Housing: Low Risk  (02/24/2023)  Transportation Needs: No Transportation Needs (02/24/2023)  Utilities: Not At Risk (02/24/2023)  Financial Resource Strain: Low Risk  (02/24/2023)  Tobacco Use: Medium Risk (02/24/2023)    SDOH Interventions: Financial Resources:  Financial Strain Interventions: Intervention Not Indicated   Food Insecurity:  Food Insecurity Interventions: Intervention Not Indicated  Housing Insecurity:  Housing Interventions: Intervention Not Indicated   Transportation:   Transportation Interventions: Intervention Not Indicated   Follow-up plan:    CSW sending out referral to pt for assignment and follow up  Burna Sis, LCSW Clinical Social Worker Advanced Heart Failure Clinic Desk#: [phone] Cell#: [phone]

## 2023-02-24 NOTE — TOC Progression Note (Signed)
Transition of Care [facility]) - Progression Note    Patient Details  Name: [name] MRN: [id number] Date of Birth: 1945/12/24  Transition of [facility]) CM/SW Contact  Elliot Cousin, RN Phone Number: [phone] 02/24/2023, 2:59 PM  Clinical Narrative:   HF TOC CM spoke to pt's dtr and went over HF Paramedicine. She agrees with HF Paramedicine. Declines HH at this time. States pt did not need RW for home. Pt has scale at home for daily weights.   Expected Discharge Plan: Home w Home Health Services Barriers to Discharge: Continued Medical Work up  Expected Discharge Plan and Services   Discharge Planning Services: CM Consult Post Acute Care Choice: Home Health Living arrangements for the past 2 months: Single Family Home                           HH Arranged: Refused HH           Social Determinants of Health (SDOH) Interventions SDOH Screenings   Food Insecurity: No Food Insecurity (02/24/2023)  Housing: Low Risk  (02/24/2023)  Transportation Needs: No Transportation Needs (02/24/2023)  Utilities: Not At Risk (02/24/2023)  Financial Resource Strain: Low Risk  (02/24/2023)  Tobacco Use: Medium Risk (02/24/2023)    Readmission Risk Interventions    01/21/2023    4:21 PM  Readmission Risk Prevention Plan  Transportation Screening Complete  Medication Review (RN Care Manager) Complete  HRI or Home Care Consult Complete  Palliative Care Screening Not Applicable  Skilled Nursing Facility Not Applicable

## 2023-02-24 NOTE — Progress Notes (Signed)
PROGRESS NOTE    [name]  ZOX:[id number] DOB: May 09, 1946 DOA: 02/10/2023 PCP: Gaspar Garbe, MD  76/M w chronic systolic CHF, T2DM, CKD, COPD chronic hypoxemic respiratory failure and hypertension who presented with lower extremity edema. Recent hospitalization 3/26-3/30 for CHF. Cardiology outpatient follow up he had gain 5 lbs from the time of his discharge. At home he continue to have worsening edema, and weight gain for 9 lbs more. In the ED, + edema and rales, labs w/ cr 2,32, BNP 2,508, CXR noted cardiomegaly, bilateral hilar vascular congestion, with bilateral interstitial infiltrates, small bilateral pleural effusions, -Admitted, started on diuretics -4/19 -started on dobutamine infusion.  -4/20 developed VT TX to ICU -4/21: CVP 14, diuresing, continued amio,  Started milrinone -4/24: RHC with elevated filling pressures, wedge of 24, cardiac output 4.7 on milrinone -4/24: Recurrent epistaxis, treated with Afrin and tranexamic acid, Eliquis held -4/26: Persistent epistaxis ENT consulted anterior/posterior nasal packing performed by Dr. Suszanne Conners -4/27, amiodarone changed to p.o. -4/29, TEE/DCCV> NSR  Subjective: -Breathing okay, notice some swelling in his legs today  Assessment and Plan:  Acute on chronic systolic CHF (congestive heart failure) Acute hypoxic respiratory failure -Echo with EF 35 to 40%, no LVH, RV systolic function preserved,  -Admitted with low output heart failure.  -CHF team following, diuresed well on milrinone and Lasix drip, down 26 pounds, creatinine stable -Remains stable and euvolemic, off milrinone, continue torsemide -Continue Imdur and hydralazine -Discharge planning, cards checking Reds clip today  AKI on CKD3b -baseline creat around 2.2 -Creatinine improving, creatinine stabilizing, now 2 4, continue torsemide  Persistent Atrial flutter -Rate improving, was on IV amiodarone, changed to p.o. -Continue apixaban, sp TEE/DCCV yesterday, now  in sinus rhythm  Severe epistaxis -Since 4/24 evening, Rx w/Afrin and TXA  -ENT consulted, completed anterior/posterior nasal packing by Dr. Suszanne Conners 4/25, appreciate input, resumed Eliquis,  -Nasal packing removed, discontinued antibiotics -No further episodes  CAD CABG -ischemic workup deferred w/ CKD   PAD - s/p intervention 2015 Rt SFA and Rt politeal artery vascular stents, continue statin   GERD (gastroesophageal reflux disease) -Continue protonix 40 mg daily.  Type 2 diabetes mellitus with hyperlipidemia -Uncontrolled T2DM with hyperglycemia.  -Improved  COPD (chronic obstructive pulmonary disease) -On 2 L home O2 -Stable.   DNR (do not resuscitate)  DVT prophylaxis: SCDs Code Status: DNR Family Communication: None present Disposition Plan: Home per heart failure team, possibly later today  Consultants: CHF team   Procedures:   Antimicrobials:    Objective: Vitals:   02/24/23 0530 02/24/23 0600 02/24/23 0700 02/24/23 0805  BP: 99/84 121/70    Pulse: 76 83    Resp: (!) 26 (!) 23    Temp:   99.7 F (37.6 C)   TempSrc:   Oral   SpO2: 97% 98%  96%  Weight:   75.7 kg   Height:        Intake/Output Summary (Last 24 hours) at 02/24/2023 1111 Last data filed at 02/24/2023 0430 Gross per 24 hour  Intake 320 ml  Output 775 ml  Net -455 ml   Filed Weights   02/22/23 0530 02/23/23 0620 02/24/23 0700  Weight: 77.5 kg 77.4 kg 75.7 kg    Examination:  Pleasant elderly male sitting up in the recliner, AAOx3, no distress HEENT: No JVD CVS: S1-S2, regular rhythm Lungs: Decreased breath sounds to bases Abdomen: Soft, nontender, bowel sounds present Extremities: 1+ edema Psychiatry:  Mood & affect appropriate.     Data Reviewed:  CBC: Recent Labs  Lab 02/20/23 0337 02/21/23 0431 02/22/23 0042 02/23/23 0424 02/24/23 0514  WBC 5.1 6.1 6.4 6.5 7.4  HGB 11.2* 11.3* 11.6* 11.3* 11.9*  HCT 35.0* 36.9* 37.0* 36.9* 37.9*  MCV 86.0 87.4 86.0 87.4 86.5   PLT 159 177 189 200 221   Basic Metabolic Panel: Recent Labs  Lab 02/20/23 0337 02/21/23 0431 02/22/23 0042 02/23/23 0424 02/24/23 0514  NA 131* 131* 132* 134* 135  K 3.5 3.6 4.0 3.9 4.2  CL 86* 92* 93* 94* 97*  CO2 29 30 30 29 26   GLUCOSE 328* 215* 163* 179* 103*  BUN 61* 54* 56* 60* 71*  CREATININE 2.55* 2.29* 2.22* 2.36* 2.40*  CALCIUM 9.1 8.8* 9.0 9.1 9.2  MG  --   --  2.1  --   --    GFR: Estimated Creatinine Clearance: 27.5 mL/min (A) (by C-G formula based on SCr of 2.4 mg/dL (H)). Liver Function Tests: No results for input(s): "AST", "ALT", "ALKPHOS", "BILITOT", "PROT", "ALBUMIN" in the last 168 hours.  No results for input(s): "LIPASE", "AMYLASE" in the last 168 hours. No results for input(s): "AMMONIA" in the last 168 hours. Coagulation Profile: Recent Labs  Lab 02/18/23 2057 02/23/23 0424  INR 1.9* 2.1*   Cardiac Enzymes: No results for input(s): "CKTOTAL", "CKMB", "CKMBINDEX", "TROPONINI" in the last 168 hours. BNP (last 3 results) No results for input(s): "PROBNP" in the last 8760 hours. HbA1C: No results for input(s): "HGBA1C" in the last 72 hours. CBG: Recent Labs  Lab 02/23/23 1114 02/23/23 1503 02/23/23 1608 02/23/23 2153 02/24/23 0706  GLUCAP 131* 114* 86 245* 113*   Lipid Profile: No results for input(s): "CHOL", "HDL", "LDLCALC", "TRIG", "CHOLHDL", "LDLDIRECT" in the last 72 hours. Thyroid Function Tests: No results for input(s): "TSH", "T4TOTAL", "FREET4", "T3FREE", "THYROIDAB" in the last 72 hours. Anemia Panel: No results for input(s): "VITAMINB12", "FOLATE", "FERRITIN", "TIBC", "IRON", "RETICCTPCT" in the last 72 hours. Urine analysis:    Component Value Date/Time   COLORURINE STRAW (A) 10/19/2022 0628   APPEARANCEUR CLEAR 10/19/2022 0628   LABSPEC 1.009 10/19/2022 0628   PHURINE 5.0 10/19/2022 0628   GLUCOSEU >=500 (A) 10/19/2022 0628   HGBUR NEGATIVE 10/19/2022 0628   BILIRUBINUR NEGATIVE 10/19/2022 0628   KETONESUR NEGATIVE  10/19/2022 0628   PROTEINUR NEGATIVE 10/19/2022 0628   UROBILINOGEN 1.0 07/19/2013 1223   NITRITE NEGATIVE 10/19/2022 0628   LEUKOCYTESUR NEGATIVE 10/19/2022 0628   Sepsis Labs: @LABRCNTIP (procalcitonin:4,lacticidven:4)  ) Recent Results (from the past 240 hour(s))  MRSA Next Gen by PCR, Nasal     Status: None   Collection Time: 02/14/23  2:46 PM   Specimen: Nasal Mucosa; Nasal Swab  Result Value Ref Range Status   MRSA by PCR Next Gen NOT DETECTED NOT DETECTED Final    Comment: (NOTE) The GeneXpert MRSA Assay (FDA approved for NASAL specimens only), is one component of a comprehensive MRSA colonization surveillance program. It is not intended to diagnose MRSA infection nor to guide or monitor treatment for MRSA infections. Test performance is not FDA approved in patients less than 11 years old. Performed at [facility], [address], [place], [place] [address]      Radiology Studies: EP STUDY  Result Date: 02/23/2023 See surgical note for result.    Scheduled Meds:  amiodarone  200 mg Oral BID   apixaban  5 mg Oral BID   Chlorhexidine Gluconate Cloth  6 each Topical Daily   fluticasone furoate-vilanterol  1 puff Inhalation Daily  hydrALAZINE  37.5 mg Oral Q8H   insulin aspart  0-5 Units Subcutaneous QHS   insulin aspart  0-9 Units Subcutaneous TID WC   insulin aspart  6 Units Subcutaneous TID WC   insulin glargine-yfgn  35 Units Subcutaneous Daily   isosorbide mononitrate  30 mg Oral Daily   linaclotide  72 mcg Oral q morning   pantoprazole  40 mg Oral Daily   potassium chloride  20 mEq Oral Daily   rosuvastatin  20 mg Oral QHS   sodium chloride flush  10-40 mL Intracatheter Q12H   sodium chloride flush  3 mL Intravenous Q12H   torsemide  20 mg Oral Daily   umeclidinium bromide  1 puff Inhalation Daily   Continuous Infusions:  sodium chloride        LOS: 13 days    Time spent:    Zannie Cove, MD [facility]   02/24/2023,  11:11 AM

## 2023-02-24 NOTE — Progress Notes (Addendum)
Patient ID: [name], male   DOB: Dec 17, 1945, 77 y.o.   MRN: [id number]     Advanced Heart Failure Rounding Note  PCP-Cardiologist: Kristeen Miss, MD   Subjective:    - 4/20 while on dobutamine patient went into atrial flutter with RVR, became hypotensive.  Decision made to start amiodarone and transferred to CVICU. - 4/22 CO-OX marginal. Milrinone increased to 0.25 mcg. Diuresed with IV lasix. - 4/24 RHC with elevated filling pressures, transitioned to Lasix gtt at 12 mg per hour.  - 4/25 Epistaxis requiring packing by ENT. Went back into AFL  - 4/29 Nasal Packing removed; TEE/DCCV => NSR, EF 25%, RV mod HK. No clot. Mod AI   Maintaining NSR. HR 80s.   2L in UOP yesterday. CVP 10. Requiring 2L [place]. Wt not charted this morning. RN to obtain. Felt slightly more SOB this morning w/ walking.  Co-ox 54%   SCr 2.44>>2.55>>2.29>>2.22>>2.36>>2.40 today. BUN up-trending, 56>>60>>71  Nose packing removed. No further bleeding.    4/24 RHC Procedural Findings (milrinone 0.25): Hemodynamics (mmHg) RA mean 12 RV 55/16 PA 57/22, mean 34 PCWP mean 24, v-waves to 35Oxygen saturations: PA 51% AO 86% Cardiac Output (Fick) 4.71  Cardiac Index (Fick) 2.37 PVR 2.1 WU    Objective:   Weight Range: 77.4 kg Body mass index is 24.48 kg/m.   Vital Signs:   Temp:  [97.8 F (36.6 C)-99.7 F (37.6 C)] 99.7 F (37.6 C) (04/30 0700) Pulse Rate:  [66-98] 83 (04/30 0600) Resp:  [10-28] 23 (04/30 0600) BP: (99-140)/(43-95) 121/70 (04/30 0600) SpO2:  [84 %-100 %] 96 % (04/30 0805) Last BM Date : 02/23/23  Weight change: Filed Weights   02/21/23 0600 02/22/23 0530 02/23/23 0620  Weight: 76.7 kg 77.5 kg 77.4 kg    Intake/Output:   Intake/Output Summary (Last 24 hours) at 02/24/2023 0820 Last data filed at 02/24/2023 0430 Gross per 24 hour  Intake 363 ml  Output 1075 ml  Net -712 ml    Physical Exam   CVP 10  General:  fatigued appearing, No respiratory difficulty HEENT:  normal Neck: supple. JVD ~10-12 cm. Carotids 2+ bilat; no bruits. No lymphadenopathy or thyromegaly appreciated. Cor: PMI nondisplaced. Regular rate & rhythm. No rubs, gallops or murmurs. Lungs: Clear  Abdomen: soft, nontender, nondistended. No hepatosplenomegaly. No bruits or masses. Good bowel sounds. Extremities: no cyanosis, clubbing, rash, trace-1+ b/l LE edema + TED hoses  Neuro: alert & oriented x 3, cranial nerves grossly intact. moves all 4 extremities w/o difficulty. Affect pleasant.   Telemetry   NSR 80s   EKG    No new EKG to review  Labs    CBC Recent Labs    02/23/23 0424 02/24/23 0514  WBC 6.5 7.4  HGB 11.3* 11.9*  HCT 36.9* 37.9*  MCV 87.4 86.5  PLT 200 221    Basic Metabolic Panel Recent Labs    78/29/56 0042 02/23/23 0424 02/24/23 0514  NA 132* 134* 135  K 4.0 3.9 4.2  CL 93* 94* 97*  CO2 30 29 26   GLUCOSE 163* 179* 103*  BUN 56* 60* 71*  CREATININE 2.22* 2.36* 2.40*  CALCIUM 9.0 9.1 9.2  MG 2.1  --   --    BNP (last 3 results) Recent Labs    01/20/23 1357 02/02/23 1130 02/10/23 1302  BNP 1,939.2* 2,275.8* 2,508.7*      Imaging    EP STUDY  Result Date: 02/23/2023 See surgical note for result.    Medications:  Scheduled Medications:  amiodarone  200 mg Oral BID   apixaban  5 mg Oral BID   Chlorhexidine Gluconate Cloth  6 each Topical Daily   fluticasone furoate-vilanterol  1 puff Inhalation Daily   hydrALAZINE  37.5 mg Oral Q8H   insulin aspart  0-5 Units Subcutaneous QHS   insulin aspart  0-9 Units Subcutaneous TID WC   insulin aspart  6 Units Subcutaneous TID WC   insulin glargine-yfgn  35 Units Subcutaneous Daily   isosorbide mononitrate  30 mg Oral Daily   linaclotide  72 mcg Oral q morning   pantoprazole  40 mg Oral Daily   potassium chloride  20 mEq Oral Daily   rosuvastatin  20 mg Oral QHS   sodium chloride flush  10-40 mL Intracatheter Q12H   sodium chloride flush  3 mL Intravenous Q12H   torsemide  20  mg Oral Daily   umeclidinium bromide  1 puff Inhalation Daily    Infusions:  sodium chloride       PRN Medications: acetaminophen, ALPRAZolam, ipratropium-albuterol, mouth rinse, phenol, sodium chloride flush, traZODone   Patient Profile   [name] is a 77 y.o. male with CAD c/p CABG 2003, HFrEF, chronic DOE, Rt CEA >49yrs ago, Lt CEA 14', HTN, DM2, HLD, PAD w/ Rt SFA and Rt politeal artery vascular stents 15', prostate cancer. Admitted with a/c HFrEF.  Assessment/Plan   1. Acute on chronic systolic CHF: Ischemic cardiomyopathy. Echo in 3/24 with EF 35-40%, RV normal. Echo this admission with EF 30%, moderately decreased RV dysfunction with mild RVE, PASP 63 mmHg, IVC dilated. Low output HF this admission, has required inotrope. RHC today showed ongoing volume overload with stable cardiac output on milrinone 0.25. Prominent v-waves but only mild MR on echo (likely due to volume overload/diastolic dysfunction). GDMT limited by hypotension/AKI. Now off milrinone, Co-ox 54% today. CVP 10. SCr  2.4 (stable) but BUN up trending  - Check ReDs. If elevated, may need dose of IV Lasix.  - continue hydralazine 37.5 mg tid and continue Imdur 30 daily.   - additional GDMT limited by renal fx  2.  Atrial flutter: Rate controlled.  Amiodarone IV stopped 4/27 with prolonged QTc and po started.  QTc was shorter on telemetry. S/p TEE/DCCV 4/28. Maintaining NSR this morning. -continue po amiodarone 200 mg bid.  - Continue Eliquis.  3. CAD: Status post CABG 2003.  No ACS this admission. No chest pain.  - Continue Crestor.  - No coronary angiography with no ACS and AKI.  4.Carotid Disease: S/p b/l CEA - continue statin 5. PAD: s/p intervention 2015 Rt SFA and Rt politeal artery vascular stents - continue statin  6. Type 2 DM: Per [facility].   7. COPD/emphysema: Uses 2 L home O2 as needed, requiring oxygen here.  8. AKI on CKD stage 4: Baseline creatinine ~ 2.2.  Creatinine  2.8>3>2.75>2.59>>2.44>>2.5>>2.29>>2.22>2.4>2.4.  - diuretics per above, follow BMP  - follow co-ox off milrinone  9. Epistaxis: 4/24 Transexamic used + Afrin.  Nasal packing 4/25, removed 4/29. No further visible bleeding. Hgb stable.  - monitor closely w/ Eliquis restart - appreciate ENT    Length of Stay: [address], PA-C  02/24/2023, 8:20 AM  Advanced Heart Failure Team Pager [phone] (M-F; 7a - 5p)  Please contact [facility] for night-coverage after hours (5p -7a ) and weekends on [email or web address]  Patient seen and examined with the above-signed Advanced Practice Provider and/or Housestaff. I personally reviewed laboratory data, imaging studies and relevant  notes. I independently examined the patient and formulated the important aspects of the plan. I have edited the note to reflect any of my changes or salient points. I have personally discussed the plan with the patient and/or family.  Remains in NSR after DC-CV yesterday. Feels better walking halls. No CP or SOB. Ankles swollen. Co-ox marginal at 54% CVP 10  Scr stable at 2.4  General:  Walking hall No resp difficulty HEENT: normal Neck: supple. JVP 10  Carotids 2+ bilat; no bruits. No lymphadenopathy or thryomegaly appreciated. Cor: PMI nondisplaced. Regular rate & rhythm. No rubs, gallops or murmurs. Lungs: clear Abdomen: soft, nontender, nondistended. No hepatosplenomegaly. No bruits or masses. Good bowel sounds. Extremities: no cyanosis, clubbing, rash,1+  edema Neuro: alert & orientedx3, cranial nerves grossly intact. moves all 4 extremities w/o difficulty. Affect pleasant  Maintaining NSR after DC-CV. Continue amio and Eliquis. Will plan IV diuresis today. Can go to SDU. Follow renal function and co-ox.  Arvilla Meres, MD  5:50 PM

## 2023-02-25 DIAGNOSIS — K219 Gastro-esophageal reflux disease without esophagitis: Secondary | ICD-10-CM | POA: Diagnosis not present

## 2023-02-25 DIAGNOSIS — I4892 Unspecified atrial flutter: Secondary | ICD-10-CM | POA: Diagnosis not present

## 2023-02-25 DIAGNOSIS — N179 Acute kidney failure, unspecified: Secondary | ICD-10-CM | POA: Diagnosis not present

## 2023-02-25 DIAGNOSIS — E1142 Type 2 diabetes mellitus with diabetic polyneuropathy: Secondary | ICD-10-CM | POA: Diagnosis not present

## 2023-02-25 DIAGNOSIS — I5023 Acute on chronic systolic (congestive) heart failure: Secondary | ICD-10-CM | POA: Diagnosis not present

## 2023-02-25 LAB — BASIC METABOLIC PANEL
Anion gap: 8 (ref 5–15)
BUN: 70 mg/dL — ABNORMAL HIGH (ref 8–23)
CO2: 27 mmol/L (ref 22–32)
Calcium: 8.9 mg/dL (ref 8.9–10.3)
Chloride: 99 mmol/L (ref 98–111)
Creatinine, Ser: 2.46 mg/dL — ABNORMAL HIGH (ref 0.61–1.24)
GFR, Estimated: 27 mL/min — ABNORMAL LOW (ref >60)
Glucose, Bld: 138 mg/dL — ABNORMAL HIGH (ref 70–99)
Potassium: 4.1 mmol/L (ref 3.5–5.1)
Sodium: 134 mmol/L — ABNORMAL LOW (ref 135–145)

## 2023-02-25 LAB — CBC
HCT: 34.6 % — ABNORMAL LOW (ref 39.0–52.0)
Hemoglobin: 10.8 g/dL — ABNORMAL LOW (ref 13.0–17.0)
MCH: 26.8 pg (ref 26.0–34.0)
MCHC: 31.2 g/dL (ref 30.0–36.0)
MCV: 85.9 fL (ref 80.0–100.0)
Platelets: 212 10*3/uL (ref 150–400)
RBC: 4.03 MIL/uL — ABNORMAL LOW (ref 4.22–5.81)
RDW: 18.6 % — ABNORMAL HIGH (ref 11.5–15.5)
WBC: 6.1 10*3/uL (ref 4.0–10.5)
nRBC: 0 % (ref 0.0–0.2)

## 2023-02-25 LAB — COOXEMETRY PANEL
Carboxyhemoglobin: 2.1 % — ABNORMAL HIGH (ref 0.5–1.5)
Carboxyhemoglobin: 1.5 % (ref 0.5–1.5)
Methemoglobin: <0.7 % (ref 0.0–1.5)
Methemoglobin: <0.7 % (ref 0.0–1.5)
O2 Saturation: 48.6 %
O2 Saturation: 47.9 %
Total hemoglobin: 11.2 g/dL — ABNORMAL LOW (ref 12.0–16.0)
Total hemoglobin: 10.8 g/dL — ABNORMAL LOW (ref 12.0–16.0)

## 2023-02-25 LAB — GLUCOSE, CAPILLARY
Glucose-Capillary: 135 mg/dL — ABNORMAL HIGH (ref 70–99)
Glucose-Capillary: 261 mg/dL — ABNORMAL HIGH (ref 70–99)
Glucose-Capillary: 126 mg/dL — ABNORMAL HIGH (ref 70–99)

## 2023-02-25 LAB — MAGNESIUM: Magnesium: 2.2 mg/dL (ref 1.7–2.4)

## 2023-02-25 MED ORDER — FUROSEMIDE 10 MG/ML IJ SOLN
80.0000 mg | Freq: Once | INTRAMUSCULAR | Status: AC
  Administered 2023-02-25: 80 mg via INTRAVENOUS
  Filled 2023-02-25: qty 8

## 2023-02-25 MED ORDER — SALINE SPRAY 0.65 % NA SOLN
1.0000 | NASAL | Status: DC | PRN
  Filled 2023-02-25: qty 44

## 2023-02-25 MED ORDER — TRAZODONE HCL 50 MG PO TABS
50.0000 mg | ORAL_TABLET | Freq: Every day | ORAL | Status: AC
  Filled 2023-02-25: qty 1

## 2023-02-25 MED ORDER — TRAZODONE HCL 50 MG PO TABS: 50.0000 mg | ORAL_TABLET | Freq: Every day | ORAL | Status: DC

## 2023-02-25 MED ORDER — TRAZODONE HCL 50 MG PO TABS
50.0000 mg | ORAL_TABLET | Freq: Every evening | ORAL | Status: DC | PRN
  Administered 2023-02-25 – 2023-03-03 (×6): 50 mg via ORAL
  Filled 2023-02-25 (×6): qty 1

## 2023-02-25 MED ORDER — FUROSEMIDE 10 MG/ML IJ SOLN
80.0000 mg | Freq: Two times a day (BID) | INTRAMUSCULAR | Status: DC
  Administered 2023-02-25 – 2023-02-26 (×2): 80 mg via INTRAVENOUS
  Filled 2023-02-25 (×2): qty 8

## 2023-02-25 MED ORDER — AYR SALINE NASAL NA GEL: 1.0000 | NASAL | Status: DC | PRN

## 2023-02-25 MED ORDER — TRAZODONE HCL 50 MG PO TABS: 50.0000 mg | ORAL_TABLET | Freq: Every evening | ORAL | Status: DC | PRN

## 2023-02-25 NOTE — Consult Note (Signed)
   [facility] Inpatient Consult   02/25/2023  IGNACIO LOWDER April 24, 1946 [id number]  [facility] [[facility]]  Accountable Care Organization [ACO] Patient: [name] Medicare  Primary Care Provider:  Gaspar Garbe, MD, [facility]  Patient screened for hospitalization with noted extreme high risk score for unplanned readmission risk 15 day length of stay which included an ICU level of care stay and to assess for potential [facility]  [[facility]] Care Management service needs for post hospital transition for care coordination.  Review of patient's electronic medical record reveals patient is currently for home post hospital when medically ready.  Spoke with the patient regarding benefits with [facility] for Care Coordination.  Patient had recently declined HH follow up but he states he would mind calls for follow up.  He states he goes to the [employer] for his insulin. Verified contact information in EPIC for demographics is correct.  Plan:  Continue to follow progress and disposition to assess for post hospital community care coordination/management needs.  Referral request for community care coordination: will make referral request upon transitioning from the hospital for follow up needs.  Of note, [facility] Care Management/Population Health does not replace or interfere with any arrangements made by the Inpatient Transition of Care team.  For questions contact:   Charlesetta Shanks, RN BSN CCM [place] [facility]  [phone] business mobile phone Toll free office [phone]  *Concierge Line  [phone] Fax number: [phone] [email or web address]

## 2023-02-25 NOTE — Progress Notes (Signed)
Mobility Specialist Progress Note:   02/25/23 0930  Mobility  Activity Ambulated with assistance in hallway  Level of Assistance Standby assist, set-up cues, supervision of patient - no hands on  Assistive Device None  Distance Ambulated (ft) 400 ft  Activity Response Tolerated well  Mobility Referral Yes  $Mobility charge 1 Mobility   Pt eager for mobility session. Required no physical assistance throughout, only supervision for safety. SpO2 WFL on 2LO2. Pt back sitting EOB with all needs met.   Addison Lank Mobility Specialist Please contact via SecureChat or  Rehab office at [phone]

## 2023-02-25 NOTE — Plan of Care (Signed)
?  Problem: Clinical Measurements: ?Goal: Will remain free from infection ?Outcome: Progressing ?  ?

## 2023-02-25 NOTE — Progress Notes (Addendum)
Patient ID: [name], male   DOB: 04-07-1946, 77 y.o.   MRN: [id number]     Advanced Heart Failure Rounding Note  PCP-Cardiologist: Kristeen Miss, MD   Subjective:    - 4/20 while on dobutamine patient went into atrial flutter with RVR, became hypotensive.  Decision made to start amiodarone and transferred to CVICU. - 4/22 CO-OX marginal. Milrinone increased to 0.25 mcg. Diuresed with IV lasix. - 4/24 RHC with elevated filling pressures, transitioned to Lasix gtt at 12 mg per hour.  - 4/25 Epistaxis requiring packing by ENT. Went back into AFL  - 4/29 Nasal Packing removed; TEE/DCCV => NSR, EF 25%, RV mod HK. No clot. Mod AI   Maintaining NSR w/ 1st deg AVB. HR 80s.   Early AM co-ox 49%. Repeat 48%.   CVP  14. < 1L in UOP yesterday. Wt trending up.   Scr 2.4>>2.5   Denies dyspnea. Says he feels well.  4/24 RHC Procedural Findings (milrinone 0.25): Hemodynamics (mmHg) RA mean 12 RV 55/16 PA 57/22, mean 34 PCWP mean 24, v-waves to 35Oxygen saturations: PA 51% AO 86% Cardiac Output (Fick) 4.71  Cardiac Index (Fick) 2.37 PVR 2.1 WU    Objective:   Weight Range: 79.1 kg Body mass index is 25.02 kg/m.   Vital Signs:   Temp:  [98 F (36.7 C)-98.2 F (36.8 C)] 98 F (36.7 C) (05/01 0423) Pulse Rate:  [76-83] 81 (05/01 0423) Resp:  [15-33] 18 (05/01 0743) BP: (111-134)/(50-72) 131/62 (05/01 0423) SpO2:  [85 %-100 %] 91 % (05/01 0423) Weight:  [79.1 kg] 79.1 kg (05/01 0105) Last BM Date :  (02/25/2023)  Weight change: Filed Weights   02/23/23 0620 02/24/23 0700 02/25/23 0105  Weight: 77.4 kg 75.7 kg 79.1 kg    Intake/Output:   Intake/Output Summary (Last 24 hours) at 02/25/2023 0824 Last data filed at 02/24/2023 2000 Gross per 24 hour  Intake 360 ml  Output 600 ml  Net -240 ml    Physical Exam   CVP 14  General:  Well appearing. No respiratory difficulty HEENT: normal Neck: supple. JVD elevated to jaw. Carotids 2+ bilat; no bruits. No lymphadenopathy  or thyromegaly appreciated. Cor: PMI nondisplaced. Regular rate & rhythm. No rubs, gallops or murmurs. Lungs: decreased BS at the bases bilaterally  Abdomen: soft, nontender, nondistended. No hepatosplenomegaly. No bruits or masses. Good bowel sounds. Extremities: no cyanosis, clubbing, rash, trace-1 + b/l LEE R>L  Neuro: alert & oriented x 3, cranial nerves grossly intact. moves all 4 extremities w/o difficulty. Affect pleasant.    Telemetry   NSR 80s w/ 1st degree AVB   EKG    No new EKG to review  Labs    CBC Recent Labs    02/24/23 0514 02/25/23 0528  WBC 7.4 6.1  HGB 11.9* 10.8*  HCT 37.9* 34.6*  MCV 86.5 85.9  PLT 221 212    Basic Metabolic Panel Recent Labs    08/65/78 0514 02/25/23 0528  NA 135 134*  K 4.2 4.1  CL 97* 99  CO2 26 27  GLUCOSE 103* 138*  BUN 71* 70*  CREATININE 2.40* 2.46*  CALCIUM 9.2 8.9  MG  --  2.2   BNP (last 3 results) Recent Labs    01/20/23 1357 02/02/23 1130 02/10/23 1302  BNP 1,939.2* 2,275.8* 2,508.7*      Imaging    No results found.   Medications:     Scheduled Medications:  amiodarone  200 mg Oral BID   apixaban  5 mg Oral BID   Chlorhexidine Gluconate Cloth  6 each Topical Daily   fluticasone furoate-vilanterol  1 puff Inhalation Daily   hydrALAZINE  37.5 mg Oral Q8H   insulin aspart  0-5 Units Subcutaneous QHS   insulin aspart  0-9 Units Subcutaneous TID WC   insulin aspart  6 Units Subcutaneous TID WC   insulin glargine-yfgn  35 Units Subcutaneous Daily   isosorbide mononitrate  30 mg Oral Daily   linaclotide  72 mcg Oral q morning   pantoprazole  40 mg Oral Daily   potassium chloride  20 mEq Oral Daily   rosuvastatin  20 mg Oral QHS   sodium chloride flush  10-40 mL Intracatheter Q12H   sodium chloride flush  3 mL Intravenous Q12H   torsemide  20 mg Oral Daily   umeclidinium bromide  1 puff Inhalation Daily    Infusions:  sodium chloride       PRN Medications: acetaminophen,  ALPRAZolam, ipratropium-albuterol, mouth rinse, phenol, sodium chloride flush, traZODone   Patient Profile   [name] is a 77 y.o. male with CAD c/p CABG 2003, HFrEF, chronic DOE, Rt CEA >62yrs ago, Lt CEA 14', HTN, DM2, HLD, PAD w/ Rt SFA and Rt politeal artery vascular stents 15', prostate cancer. Admitted with a/c HFrEF.  Assessment/Plan   1. Acute on chronic systolic CHF: Ischemic cardiomyopathy. Echo in 3/24 with EF 35-40%, RV normal. Echo this admission with EF 30%, moderately decreased RV dysfunction with mild RVE, PASP 63 mmHg, IVC dilated. Low output HF this admission, has required inotrope. RHC today showed ongoing volume overload with stable cardiac output on milrinone 0.25. Prominent v-waves but only mild MR on echo (likely due to volume overload/diastolic dysfunction). GDMT limited by hypotension/AKI. Now off milrinone, Co-ox 48%. CVP 14.  - Diurese w/ IV Lasix 80 mg bid. If poor response, will add metolazone to night dose - plan to keep off milrinone for now  - GDMT limited by renal fx  - continue hydralazine 37.5 mg tid and Imdur 30 daily.   2.  Atrial flutter: Rate controlled.  Amiodarone IV stopped 4/27 with prolonged QTc and po started.  QTc was shorter on telemetry. S/p TEE/DCCV 4/28. Maintaining NSR this morning. -continue po amiodarone 200 mg bid.  -Continue Eliquis.  3. CAD: Status post CABG 2003.  No ACS this admission. No chest pain.  - Continue Crestor.  - No coronary angiography with no ACS and AKI.  4.Carotid Disease: S/p b/l CEA - continue statin 5. PAD: s/p intervention 2015 Rt SFA and Rt politeal artery vascular stents - continue statin  6. Type 2 DM: Per [facility].   7. COPD/emphysema: Uses 2 L home O2 as needed, requiring oxygen here.  8. AKI on CKD stage 4: Baseline creatinine ~ 2.2.  Creatinine 2.8>3>2.75>2.59>>2.44>>2.5>>2.29>>2.22>2.4>2.4>2.46.  - diuretics per above, follow BMP  9. Epistaxis: 4/24 Transexamic used + Afrin.  Nasal packing 4/25, removed  4/29. No further visible bleeding. Hgb stable.  - monitor closely w/ Eliquis restart - appreciate ENT    Length of Stay: [address], PA-C  02/25/2023, 8:24 AM  Advanced Heart Failure Team Pager [phone] (M-F; 7a - 5p)  Please contact [facility] for night-coverage after hours (5p -7a ) and weekends on [email or web address]  Patient seen and examined with the above-signed Advanced Practice Provider and/or Housestaff. I personally reviewed laboratory data, imaging studies and relevant notes. I independently examined the patient and formulated the important aspects of the plan. I have edited  the note to reflect any of my changes or salient points. I have personally discussed the plan with the patient and/or family.  Remains in NSR. Off milrinone co-ox low. CVP climbing now 13-13 range Scr stable.  Deneis CP or SOB. + edema  General:  Elderly  No resp difficulty HEENT: normal Neck: supple. JVP to ear with prominent v-waves. Carotids 2+ bilat; no bruits. No lymphadenopathy or thryomegaly appreciated. Cor: PMI nondisplaced. Regular rate & rhythm. 2/6 TR Lungs: clear Abdomen: soft, nontender, nondistended. No hepatosplenomegaly. No bruits or masses. Good bowel sounds. Extremities: no cyanosis, clubbing, rash, 1-2+ ankle edema Neuro: alert & orientedx3, cranial nerves grossly intact. moves all 4 extremities w/o difficulty. Affect pleasant  He remains in NSR but HF remains tenuous with low co-ox and worsening volume status. Will keep of milrinone for now. Increase diuretics. If co-ox remains low, will ned to consider re-institution of inotropes.   Arvilla Meres, MD  2:44 PM

## 2023-02-25 NOTE — Progress Notes (Signed)
Progress Note   Patient: [name] ZOX:[id number] DOB: 09-26-1946 DOA: 02/10/2023     14 DOS: the patient was seen and examined on 02/25/2023   Brief hospital course: [name] was admitted to the hospital with the working diagnosis of heart failure exacerbation.   77 yo male with the past medical history of heart failure, T2DM, CKD, COPD chronic hypoxemic respiratory failure and hypertension who presented with lower extremity edema. Recent hospitalization 03/26 to 01/24/23 for heart failure exacerbation. He was discharged with 20 mg furosemide. At home he developed worsening lower extremity edema. Cardiology outpatient follow up he had gain 5 lbs from the time of his discharge. At home he continue to have worsening edema, and weight gain for 9 lbs more. On his initial physical examination his blood pressure was 129/82, HR 86, RR 20 and 02 saturation 92%, lungs with bilateral rales, more left than right, positive JVD, heart with S1 and S2 present and rhythmic, abdomen with no distention, positive lower extremity edema pitting.   Na 135, K 4,0 Cl 102 bicarbonate 24 glucose 169, bun 49 cr 2,32  BNP 2,508  Wbc 5,9 hgb 11,8 plt 184   Chest radiograph with cardiomegaly, bilateral hilar vascular congestion, with bilateral interstitial infiltrates, small bilateral pleural effusions, and fluid in the right fissure.  Sternotomy wires in place.   EKG 82 bpm, normal axis, right bundle branch block, sinus rhythm with 1st degree AV block, with no significant ST segment or  T wave changes.   Patient has been placed on furosemide for diuresis.   04/19 volume status is improving but not yet back to baseline.  Patient placed on dobutamine infusion.  04/20 patient developed VT and was transferred to the intensive care unit 2H per cardiology recommendations.   04/21: CVP 14, still expressing dyspnea on exertion and demonstrating signs of fluid overload.  Rate controlled with amiodarone drip.  Following  cardiology/heart failure service recommendation milrinone has been started.  Continue IV diuresis. 04/22 patient on milrinone infusion.  04/23 patient with improvement in volume status but not back to baseline. Plan for right heart catheterization.  4/24: RHC with elevated filling pressures, wedge of 24, cardiac output 4.7 on milrinone -4/24: Recurrent epistaxis, treated with Afrin and tranexamic acid, Eliquis held -4/26: Persistent epistaxis ENT consulted anterior/posterior nasal packing performed by Dr. Suszanne Duke -4/27, amiodarone changed to p.o. -4/29, TEE/DCCV> NSR  Assessment and Plan: * Acute on chronic systolic CHF (congestive heart failure) (HCC) -Echocardiogram with reduced LV systolic function with EF 35 to 40%, no LVH, RV systolic function preserved, RVSP 45,5 mid dilatation of LA, no pericardial effusion, mild to moderate TR, mild aortic stenosis.  Low output heart failure.   Urine output is 700 ml ( since admission negative fluid balance 23,337 ml).  Systolic blood pressure is 111 to 123 mmHg.  SV02 48,6 and 47,9  CVP elevated.   Had furosemide IV yesterday 60 mg, today on torsemide 20 mg daily.  Continue after load reduction with hydralazine and isosorbide.  Patient of milrinone.   Today with signs of hypervolemia, may need further diuresis.   Acute on chronic hypoxemic respiratory failure due to cardiogenic pulmonary edema.  02 saturation today 95% on 2 L/min per [name].   Acute kidney injury superimposed on chronic kidney disease (HCC) -CKD stage 3a at baseline Hyponatremia.   Today renal function with serum cr at 2,46 with K at 4,1 and serum bicarbonate at 27.  Na is 134 and Mg. 2,2   Continue  with oral torsemide., May need further diuresis.  Follow up renal function in am.   Atrial flutter (HCC) Rate control with amiodarone.  Ectopy has improved,  Continue with oral amiodarone and anticoagulation with apixaban.   Epistaxis, has improved. Required packing per  ENT.    GERD (gastroesophageal reflux disease) -Continue protonix 40 mg daily.  Type 2 diabetes mellitus with hyperlipidemia (HCC) -Uncontrolled T2DM with hyperglycemia.   Continue sliding scale insulin and  basal long-acting regimen Pre meal insulin 6 units.  Fasting glucose this am 138   Continue with statin therapy.   COPD (chronic obstructive pulmonary disease) (HCC) -No clinical signs of exacerbation. -Continue current medical management.   DNR (do not resuscitate) -Verified pt is a DNR. Pt is emphatic that he is a DNR/DNI. -Daughter at bedside also confirming code Status.        Subjective: Patient with improvement in dyspnea and edema, no chest pain or palpitations. No further epistaxis.   Physical Exam: Vitals:   02/24/23 2000 02/25/23 0105 02/25/23 0423 02/25/23 0743  BP: (!) 123/56 (!) 126/55 131/62   Pulse: 80 81 81   Resp: 19 18 20 18   Temp: 98.2 F (36.8 C) 98.2 F (36.8 C) 98 F (36.7 C)   TempSrc: Oral Oral Oral   SpO2: 95% (!) 85% 91%   Weight:  79.1 kg    Height:       Neurology awake and alert ENT with mild pallor Cardiovascular with S1 and S2 present, regular with no gallops, rubs or murmurs.  Positive JVD Trace lower extremity edema Respiratory with mild rales at bases with no wheezing or rhonchi Abdomen with no distention  Data Reviewed:    Family Communication: no family at the bedside   Disposition: Status is: Inpatient Remains inpatient appropriate because: heart failure   Planned Discharge Destination: Home     Author: Coralie Keens, MD 02/25/2023 11:26 AM  For on call review [email or web address].

## 2023-02-26 ENCOUNTER — Encounter (HOSPITAL_COMMUNITY): Payer: Self-pay | Admitting: Internal Medicine

## 2023-02-26 DIAGNOSIS — E1169 Type 2 diabetes mellitus with other specified complication: Secondary | ICD-10-CM | POA: Diagnosis not present

## 2023-02-26 DIAGNOSIS — I5023 Acute on chronic systolic (congestive) heart failure: Secondary | ICD-10-CM | POA: Diagnosis not present

## 2023-02-26 DIAGNOSIS — I4892 Unspecified atrial flutter: Secondary | ICD-10-CM | POA: Diagnosis not present

## 2023-02-26 DIAGNOSIS — N179 Acute kidney failure, unspecified: Secondary | ICD-10-CM | POA: Diagnosis not present

## 2023-02-26 LAB — CBC
HCT: 34.1 % — ABNORMAL LOW (ref 39.0–52.0)
Hemoglobin: 10.5 g/dL — ABNORMAL LOW (ref 13.0–17.0)
MCH: 26.9 pg (ref 26.0–34.0)
MCHC: 30.8 g/dL (ref 30.0–36.0)
MCV: 87.2 fL (ref 80.0–100.0)
Platelets: 203 10*3/uL (ref 150–400)
RBC: 3.91 MIL/uL — ABNORMAL LOW (ref 4.22–5.81)
RDW: 18.7 % — ABNORMAL HIGH (ref 11.5–15.5)
WBC: 4.9 10*3/uL (ref 4.0–10.5)
nRBC: 0 % (ref 0.0–0.2)

## 2023-02-26 LAB — BASIC METABOLIC PANEL
Anion gap: 12 (ref 5–15)
BUN: 71 mg/dL — ABNORMAL HIGH (ref 8–23)
CO2: 25 mmol/L (ref 22–32)
Calcium: 9.2 mg/dL (ref 8.9–10.3)
Chloride: 99 mmol/L (ref 98–111)
Creatinine, Ser: 2.36 mg/dL — ABNORMAL HIGH (ref 0.61–1.24)
GFR, Estimated: 28 mL/min — ABNORMAL LOW (ref >60)
Glucose, Bld: 173 mg/dL — ABNORMAL HIGH (ref 70–99)
Potassium: 3.9 mmol/L (ref 3.5–5.1)
Sodium: 136 mmol/L (ref 135–145)

## 2023-02-26 LAB — COOXEMETRY PANEL
Carboxyhemoglobin: 1.7 % — ABNORMAL HIGH (ref 0.5–1.5)
Methemoglobin: <0.7 % (ref 0.0–1.5)
O2 Saturation: 53.6 %
Total hemoglobin: 10.8 g/dL — ABNORMAL LOW (ref 12.0–16.0)

## 2023-02-26 LAB — GLUCOSE, CAPILLARY
Glucose-Capillary: 163 mg/dL — ABNORMAL HIGH (ref 70–99)
Glucose-Capillary: 178 mg/dL — ABNORMAL HIGH (ref 70–99)
Glucose-Capillary: 239 mg/dL — ABNORMAL HIGH (ref 70–99)
Glucose-Capillary: 122 mg/dL — ABNORMAL HIGH (ref 70–99)
Glucose-Capillary: 118 mg/dL — ABNORMAL HIGH (ref 70–99)

## 2023-02-26 LAB — MAGNESIUM: Magnesium: 2.3 mg/dL (ref 1.7–2.4)

## 2023-02-26 MED ORDER — POTASSIUM CHLORIDE CRYS ER 20 MEQ PO TBCR
20.0000 meq | EXTENDED_RELEASE_TABLET | Freq: Once | ORAL | Status: AC
  Administered 2023-02-26: 20 meq via ORAL
  Filled 2023-02-26: qty 1

## 2023-02-26 MED ORDER — DICLOFENAC SODIUM 1 % EX GEL
2.0000 g | Freq: Four times a day (QID) | CUTANEOUS | Status: DC
  Administered 2023-02-27 – 2023-03-01 (×6): 2 g via TOPICAL
  Filled 2023-02-26 (×2): qty 100

## 2023-02-26 MED ORDER — FUROSEMIDE 10 MG/ML IJ SOLN
20.0000 mg/h | INTRAVENOUS | Status: DC
  Administered 2023-02-26 – 2023-03-01 (×8): 20 mg/h via INTRAVENOUS
  Filled 2023-02-26 (×10): qty 20

## 2023-02-26 NOTE — Progress Notes (Signed)
PT alert and oriented. Independent in room. CVP monitoring disconnected during the day for PT mobility. PT's urine output has been concentrated and not the volume desired by Cardiology, per Provider order, restarted Lasix drip. PT verbalized frustration with ongoing hospitalization. Daughter at bedside this afternoon, updated her on the plan of care. PT also requested using his own Voltaren Gel, requested order for same from Dr. Ella Jubilee. Dr Ella Jubilee ordered Voltaren Gel, PT states the PRN Tyenol took care of it and "send the other back".

## 2023-02-26 NOTE — Progress Notes (Signed)
Mobility Specialist Progress Note:   02/26/23 0930  Mobility  Activity Ambulated with assistance in hallway  Level of Assistance Standby assist, set-up cues, supervision of patient - no hands on  Assistive Device Other (Comment) (IV Pole)  Distance Ambulated (ft) 400 ft  Activity Response Tolerated well  Mobility Referral Yes  $Mobility charge 1 Mobility   Pt agreeable to mobility session. Required no physical assistance throughout ambulation. SpO2 WFL on 2LO2. Pt back sitting EOB with all needs met.   Addison Lank Mobility Specialist Please contact via SecureChat or  Rehab office at [phone]

## 2023-02-26 NOTE — Plan of Care (Signed)

## 2023-02-26 NOTE — Anesthesia Postprocedure Evaluation (Signed)
Anesthesia Post Note  Patient: AZURE BARRALES  Procedure(s) Performed: TRANSESOPHAGEAL ECHOCARDIOGRAM CARDIOVERSION     Patient location during evaluation: Cath Lab Anesthesia Type: General Level of consciousness: awake and patient cooperative Pain management: pain level controlled Vital Signs Assessment: post-procedure vital signs reviewed and stable Respiratory status: spontaneous breathing, nonlabored ventilation, respiratory function stable and patient connected to nasal cannula oxygen Cardiovascular status: stable Postop Assessment: no apparent nausea or vomiting Anesthetic complications: no   No notable events documented.  Last Vitals:  Vitals:   02/26/23 1114 02/26/23 1435  BP: 122/79 (!) 120/55  Pulse: 75   Resp: 20   Temp: 36.7 C   SpO2: 94%     Last Pain:  Vitals:   02/26/23 1435  TempSrc:   PainSc: 5                  Aigner Horseman

## 2023-02-26 NOTE — Progress Notes (Signed)
Progress Note   Patient: [name] IRJ:[id number] DOB: [birth year] DOA: 02/10/2023     15 DOS: the patient was seen and examined on 02/26/2023   Brief hospital course: [name] was admitted to the hospital with the working diagnosis of heart failure exacerbation.   77 yo male with the past medical history of heart failure, T2DM, CKD, COPD chronic hypoxemic respiratory failure and hypertension who presented with lower extremity edema. Recent hospitalization 03/26 to 01/24/23 for heart failure exacerbation. He was discharged with 20 mg furosemide. At home he developed worsening lower extremity edema. Cardiology outpatient follow up he had gain 5 lbs from the time of his discharge. At home he continue to have worsening edema, and weight gain for 9 lbs more. On his initial physical examination his blood pressure was 129/82, HR 86, RR 20 and 02 saturation 92%, lungs with bilateral rales, more left than right, positive JVD, heart with S1 and S2 present and rhythmic, abdomen with no distention, positive lower extremity edema pitting.   Na 135, K 4,0 Cl 102 bicarbonate 24 glucose 169, bun 49 cr 2,32  BNP 2,508  Wbc 5,9 hgb 11,8 plt 184   Chest radiograph with cardiomegaly, bilateral hilar vascular congestion, with bilateral interstitial infiltrates, small bilateral pleural effusions, and fluid in the right fissure.  Sternotomy wires in place.   EKG 82 bpm, normal axis, right bundle branch block, sinus rhythm with 1st degree AV block, with no significant ST segment or  T wave changes.   Patient has been placed on furosemide for diuresis.   04/19 volume status is improving but not yet back to baseline.  Patient placed on dobutamine infusion.  04/20 patient developed VT and was transferred to the intensive care unit 2H per cardiology recommendations.   04/21: CVP 14, still expressing dyspnea on exertion and demonstrating signs of fluid overload.  Rate controlled with amiodarone drip.  Following  cardiology/heart failure service recommendation milrinone has been started.  Continue IV diuresis. 04/22 patient on milrinone infusion.  04/23 patient with improvement in volume status but not back to baseline. Plan for right heart catheterization.  4/24: RHC with elevated filling pressures, wedge of 24, cardiac output 4.7 on milrinone -4/24: Recurrent epistaxis, treated with Afrin and tranexamic acid, Eliquis held -4/26: Persistent epistaxis ENT consulted anterior/posterior nasal packing performed by Dr. Suszanne Conners -4/27, amiodarone changed to p.o. -4/29, TEE/DCCV> NSR  Assessment and Plan: * Acute on chronic systolic CHF (congestive heart failure) (HCC) -Echocardiogram with reduced LV systolic function with EF 35 to 40%, no LVH, RV systolic function preserved, RVSP 45,5 mid dilatation of LA, no pericardial effusion, mild to moderate TR, mild aortic stenosis.  Low output heart failure.   Urine output is 1,325 ml  Systolic blood pressure is 116 to 123 mmHg.  SV02 53,6  Continue diuresis with furosemide 80 mg IV q12 hrs  Continue after load reduction with hydralazine and isosorbide.   Acute on chronic hypoxemic respiratory failure due to cardiogenic pulmonary edema.  02 saturation today 95% on 2 L/min per [place].   Acute kidney injury superimposed on chronic kidney disease (HCC) -CKD stage 3a at baseline Hyponatremia.   Renal function with serum cr at 2,36 with K at 3,9 and serum bicarbonate at 25, Na is 136  Continue diuresis, and follow up renal function in am.   Atrial flutter (HCC) 04/29. Sp direct current cardioversion, returning to sinus rhythm.   Rate control with amiodarone.  Ectopy has improved,  Continue with oral amiodarone and  anticoagulation with apixaban.   Epistaxis, has improved. Required packing per ENT.    GERD (gastroesophageal reflux disease) -Continue protonix 40 mg daily.  Type 2 diabetes mellitus with hyperlipidemia (HCC) -Uncontrolled T2DM with  hyperglycemia.   Continue sliding scale insulin and  basal long-acting regimen Pre meal insulin 6 units.  Fasting glucose this am 173   Continue with statin therapy.   COPD (chronic obstructive pulmonary disease) (HCC) -No clinical signs of exacerbation. -Continue current medical management.   DNR (do not resuscitate) -Verified pt is a DNR. Pt is emphatic that he is a DNR/DNI. -Daughter at bedside also confirming code Status.        Subjective: patient is feeling better today, improvement in edema and dyspnea.   Physical Exam: Vitals:   02/26/23 0025 02/26/23 0440 02/26/23 0810 02/26/23 1114  BP: (!) 126/57 131/62 (!) 116/53 122/79  Pulse: 76 80 81 75  Resp: 19 18 20 20   Temp: 98.7 F (37.1 C) 98.1 F (36.7 C) 98.1 F (36.7 C) 98.1 F (36.7 C)  TempSrc: Oral Oral Oral Oral  SpO2: 95% 97% 90% 94%  Weight: 79.1 kg     Height:       Neurology awake and alert ENT with mild pallor Cardiovascular with S1 and S2 present and rhythmic with no gallops, or rubs No JVD Trace lower extremity edema Respiratory with no rales or wheezing Abdomen with no distention  Data Reviewed:    Family Communication: no family at the bedside   Disposition: Status is: Inpatient Remains inpatient appropriate because: IV diuresis   Planned Discharge Destination: Home      Author: Coralie Keens, MD 02/26/2023 1:10 PM  For on call review [email or web address].

## 2023-02-26 NOTE — Plan of Care (Signed)
  Problem: Education: Goal: Knowledge of General Education information will improve Description: Including pain rating scale, medication(s)/side effects and non-pharmacologic comfort measures Outcome: Progressing   Problem: Clinical Measurements: Goal: Respiratory complications will improve Outcome: Progressing   Problem: Activity: Goal: Risk for activity intolerance will decrease Outcome: Progressing   

## 2023-02-26 NOTE — Plan of Care (Signed)
  Problem: Clinical Measurements: Goal: Respiratory complications will improve Outcome: Progressing   Problem: Activity: Goal: Risk for activity intolerance will decrease Outcome: Progressing   Problem: Elimination: Goal: Will not experience complications related to urinary retention Outcome: Progressing   Problem: Safety: Goal: Ability to remain free from injury will improve Outcome: Progressing   

## 2023-02-26 NOTE — Progress Notes (Addendum)
Patient ID: [name], male   DOB: 1946-04-08, 77 y.o.   MRN: [id number]     Advanced Heart Failure Rounding Note  PCP-Cardiologist: Kristeen Miss, MD   Subjective:    - 4/20 while on dobutamine patient went into atrial flutter with RVR, became hypotensive.  Decision made to start amiodarone and transferred to CVICU. - 4/22 CO-OX marginal. Milrinone increased to 0.25 mcg. Diuresed with IV lasix. - 4/24 RHC with elevated filling pressures, transitioned to Lasix gtt at 12 mg per hour.  - 4/25 Epistaxis requiring packing by ENT. Went back into AFL  - 4/29 Nasal Packing removed; TEE/DCCV => NSR, EF 25%, RV mod HK. No clot. Mod AI   Maintaining NSR w/ 1st deg AVB. HR 80s.   Co-ox 54% today.   1.3L in UOP yesterday w/ IV Lasix.   SCr 2.46>>2.36  Remains volume up on exam. CVP 16 on my read.   Says he feels ok. Denies any significant dyspnea. Ambulating ok. Slept well last night.   4/24 RHC Procedural Findings (milrinone 0.25): Hemodynamics (mmHg) RA mean 12 RV 55/16 PA 57/22, mean 34 PCWP mean 24, v-waves to 35Oxygen saturations: PA 51% AO 86% Cardiac Output (Fick) 4.71  Cardiac Index (Fick) 2.37 PVR 2.1 WU    Objective:   Weight Range: 79.1 kg Body mass index is 25.02 kg/m.   Vital Signs:   Temp:  [97.9 F (36.6 C)-98.7 F (37.1 C)] 98.1 F (36.7 C) (05/02 0810) Pulse Rate:  [76-81] 81 (05/02 0810) Resp:  [18-20] 20 (05/02 0810) BP: (104-131)/(47-62) 116/53 (05/02 0810) SpO2:  [90 %-97 %] 90 % (05/02 0810) Weight:  [79.1 kg] 79.1 kg (05/02 0025) Last BM Date : 02/25/23  Weight change: Filed Weights   02/24/23 0700 02/25/23 0105 02/26/23 0025  Weight: 75.7 kg 79.1 kg 79.1 kg    Intake/Output:   Intake/Output Summary (Last 24 hours) at 02/26/2023 0828 Last data filed at 02/26/2023 0810 Gross per 24 hour  Intake 1200 ml  Output 1325 ml  Net -125 ml    Physical Exam   CVP 16  General:  Well appearing. No respiratory difficulty HEENT: normal Neck:  supple. JVD elevated to jaw. Carotids 2+ bilat; no bruits. No lymphadenopathy or thyromegaly appreciated. Cor: PMI nondisplaced. Regular rate & rhythm. No rubs, gallops or murmurs. Lungs: decreased BS at the bases bilaterally, faint basilar rales  Abdomen: soft, nontender, nondistended. No hepatosplenomegaly. No bruits or masses. Good bowel sounds. Extremities: no cyanosis, clubbing, rash, 1+ b/l ankle edema + RUE PICC  Neuro: alert & oriented x 3, cranial nerves grossly intact. moves all 4 extremities w/o difficulty. Affect pleasant.   Telemetry   NSR 80s w/ 1st degree AVB   EKG    No new EKG to review  Labs    CBC Recent Labs    02/25/23 0528 02/26/23 0726  WBC 6.1 4.9  HGB 10.8* 10.5*  HCT 34.6* 34.1*  MCV 85.9 87.2  PLT 212 203    Basic Metabolic Panel Recent Labs    40/98/11 0528 02/26/23 0726  NA 134* 136  K 4.1 3.9  CL 99 99  CO2 27 25  GLUCOSE 138* 173*  BUN 70* 71*  CREATININE 2.46* 2.36*  CALCIUM 8.9 9.2  MG 2.2 2.3   BNP (last 3 results) Recent Labs    01/20/23 1357 02/02/23 1130 02/10/23 1302  BNP 1,939.2* 2,275.8* 2,508.7*      Imaging    No results found.   Medications:  Scheduled Medications:  amiodarone  200 mg Oral BID   apixaban  5 mg Oral BID   Chlorhexidine Gluconate Cloth  6 each Topical Daily   fluticasone furoate-vilanterol  1 puff Inhalation Daily   furosemide  80 mg Intravenous BID   hydrALAZINE  37.5 mg Oral Q8H   insulin aspart  0-5 Units Subcutaneous QHS   insulin aspart  0-9 Units Subcutaneous TID WC   insulin aspart  6 Units Subcutaneous TID WC   insulin glargine-yfgn  35 Units Subcutaneous Daily   isosorbide mononitrate  30 mg Oral Daily   linaclotide  72 mcg Oral q morning   pantoprazole  40 mg Oral Daily   potassium chloride  20 mEq Oral Daily   rosuvastatin  20 mg Oral QHS   sodium chloride flush  10-40 mL Intracatheter Q12H   sodium chloride flush  3 mL Intravenous Q12H   umeclidinium bromide  1  puff Inhalation Daily    Infusions:  sodium chloride       PRN Medications: acetaminophen, ALPRAZolam, ipratropium-albuterol, mouth rinse, phenol, saline, sodium chloride, sodium chloride flush, traZODone   Patient Profile   Mr. Dolinger is a 77 y.o. male with CAD c/p CABG 2003, HFrEF, chronic DOE, Rt CEA >35yrs ago, Lt CEA 14', HTN, DM2, HLD, PAD w/ Rt SFA and Rt politeal artery vascular stents 15', prostate cancer. Admitted with a/c HFrEF.  Assessment/Plan   1. Acute on chronic systolic CHF: Ischemic cardiomyopathy. Echo in 3/24 with EF 35-40%, RV normal. Echo this admission with EF 30%, moderately decreased RV dysfunction with mild RVE, PASP 63 mmHg, IVC dilated. Low output HF this admission, has required inotrope. RHC today showed ongoing volume overload with stable cardiac output on milrinone 0.25. Prominent v-waves but only mild MR on echo (likely due to volume overload/diastolic dysfunction). GDMT limited by hypotension/AKI. Now off milrinone, Co-ox 54%. CVP 16.  - Diurese w/ IV Lasix 80 mg bid + 2.5 of metolazone x 1.  - plan to keep off milrinone for now. If unable to diurese, may need to re institute   - GDMT limited by renal fx  - continue hydralazine 37.5 mg tid and Imdur 30 daily.   2.  Atrial flutter: Rate controlled.  Amiodarone IV stopped 4/27 with prolonged QTc and po started.  QTc was shorter on telemetry. S/p TEE/DCCV 4/28. Maintaining NSR this morning. -continue po amiodarone 200 mg bid.  -Continue Eliquis.  3. CAD: Status post CABG 2003.  No ACS this admission. No chest pain.  - Continue Crestor.  - No coronary angiography with no ACS and AKI.  4.Carotid Disease: S/p b/l CEA - continue statin 5. PAD: s/p intervention 2015 Rt SFA and Rt politeal artery vascular stents - continue statin  6. Type 2 DM: Per [facility].   7. COPD/emphysema: Uses 2 L home O2 as needed, requiring oxygen here.  8. AKI on CKD stage 4: Baseline creatinine ~ 2.2.  Creatinine  2.8>3>2.75>2.59>>2.44>>2.5>>2.29>>2.22>2.4>2.4>2.46>>2.36.  - diuretics per above, follow BMP  9. Epistaxis: 4/24 Transexamic used + Afrin.  Nasal packing 4/25, removed 4/29. No further visible bleeding. Hgb stable.  - monitor closely w/ Eliquis restart - appreciate ENT    Length of Stay: [address] PA-C  02/26/2023, 8:28 AM  Advanced Heart Failure Team Pager [phone] (M-F; 7a - 5p)  Please contact [facility] for night-coverage after hours (5p -7a ) and weekends on [email or web address]  Patient seen and examined with the above-signed Advanced Practice Provider and/or Housestaff. I personally  reviewed laboratory data, imaging studies and relevant notes. I independently examined the patient and formulated the important aspects of the plan. I have edited the note to reflect any of my changes or salient points. I have personally discussed the plan with the patient and/or family.  Remains in NSR on amio. On IV lasix but poor diuresis. CVP still markedly elevated. Co-ox marginal. Denies SOB, orthopnea or PND  General:  Weak appearing. No resp difficulty HEENT: normal Neck: supple. JVP to ear with prominent v-waves Carotids 2+ bilat; no bruits. No lymphadenopathy or thryomegaly appreciated. Cor: PMI nondisplaced. Regular rate & rhythm. 2/6 TR Lungs: clear Abdomen: soft, nontender, nondistended. No hepatosplenomegaly. No bruits or masses. Good bowel sounds. Extremities: no cyanosis, clubbing, rash, 1-2+ ankle edema Neuro: alert & orientedx3, cranial nerves grossly intact. moves all 4 extremities w/o difficulty. Affect pleasant  Remains in NSR but continues with low output HF and volume overload. I am hesitant to restart milrinone as it may trigger recurrent AF. Will continue to push diuretics and switch to lasix gtt and add metolazone. If unable to diurese may need to re-initiate inotropes.   Arvilla Meres, MD  6:29 PM

## 2023-02-27 DIAGNOSIS — I5023 Acute on chronic systolic (congestive) heart failure: Secondary | ICD-10-CM | POA: Diagnosis not present

## 2023-02-27 DIAGNOSIS — I4892 Unspecified atrial flutter: Secondary | ICD-10-CM | POA: Diagnosis not present

## 2023-02-27 DIAGNOSIS — Z66 Do not resuscitate: Secondary | ICD-10-CM | POA: Diagnosis not present

## 2023-02-27 DIAGNOSIS — N179 Acute kidney failure, unspecified: Secondary | ICD-10-CM | POA: Diagnosis not present

## 2023-02-27 LAB — COOXEMETRY PANEL
Carboxyhemoglobin: 0.4 % — ABNORMAL LOW (ref 0.5–1.5)
Carboxyhemoglobin: 1.6 % — ABNORMAL HIGH (ref 0.5–1.5)
Methemoglobin: <0.7 % (ref 0.0–1.5)
Methemoglobin: <0.7 % (ref 0.0–1.5)
O2 Saturation: 48.5 %
O2 Saturation: 49.8 %
Total hemoglobin: 11.2 g/dL — ABNORMAL LOW (ref 12.0–16.0)
Total hemoglobin: 10.8 g/dL — ABNORMAL LOW (ref 12.0–16.0)

## 2023-02-27 LAB — CBC
HCT: 33.8 % — ABNORMAL LOW (ref 39.0–52.0)
Hemoglobin: 10.7 g/dL — ABNORMAL LOW (ref 13.0–17.0)
MCH: 27.2 pg (ref 26.0–34.0)
MCHC: 31.7 g/dL (ref 30.0–36.0)
MCV: 85.8 fL (ref 80.0–100.0)
Platelets: 234 10*3/uL (ref 150–400)
RBC: 3.94 MIL/uL — ABNORMAL LOW (ref 4.22–5.81)
RDW: 18.4 % — ABNORMAL HIGH (ref 11.5–15.5)
WBC: 5.8 10*3/uL (ref 4.0–10.5)
nRBC: 0 % (ref 0.0–0.2)

## 2023-02-27 LAB — GLUCOSE, CAPILLARY
Glucose-Capillary: 225 mg/dL — ABNORMAL HIGH (ref 70–99)
Glucose-Capillary: 144 mg/dL — ABNORMAL HIGH (ref 70–99)
Glucose-Capillary: 252 mg/dL — ABNORMAL HIGH (ref 70–99)
Glucose-Capillary: 203 mg/dL — ABNORMAL HIGH (ref 70–99)

## 2023-02-27 LAB — RENAL FUNCTION PANEL
Albumin: 3.1 g/dL — ABNORMAL LOW (ref 3.5–5.0)
Anion gap: 12 (ref 5–15)
BUN: 80 mg/dL — ABNORMAL HIGH (ref 8–23)
CO2: 24 mmol/L (ref 22–32)
Calcium: 8.9 mg/dL (ref 8.9–10.3)
Chloride: 97 mmol/L — ABNORMAL LOW (ref 98–111)
Creatinine, Ser: 2.63 mg/dL — ABNORMAL HIGH (ref 0.61–1.24)
GFR, Estimated: 25 mL/min — ABNORMAL LOW (ref >60)
Glucose, Bld: 230 mg/dL — ABNORMAL HIGH (ref 70–99)
Phosphorus: 3.1 mg/dL (ref 2.5–4.6)
Potassium: 4.1 mmol/L (ref 3.5–5.1)
Sodium: 133 mmol/L — ABNORMAL LOW (ref 135–145)

## 2023-02-27 LAB — MAGNESIUM: Magnesium: 2.2 mg/dL (ref 1.7–2.4)

## 2023-02-27 MED ORDER — MILRINONE LACTATE IN DEXTROSE 20-5 MG/100ML-% IV SOLN
0.1250 ug/kg/min | INTRAVENOUS | Status: DC
  Administered 2023-02-27 – 2023-03-03 (×5): 0.125 ug/kg/min via INTRAVENOUS
  Filled 2023-02-27 (×5): qty 100

## 2023-02-27 MED ORDER — POTASSIUM CHLORIDE CRYS ER 20 MEQ PO TBCR
20.0000 meq | EXTENDED_RELEASE_TABLET | Freq: Once | ORAL | Status: AC
  Administered 2023-02-27: 20 meq via ORAL
  Filled 2023-02-27: qty 1

## 2023-02-27 MED ORDER — METOLAZONE 2.5 MG PO TABS
2.5000 mg | ORAL_TABLET | Freq: Once | ORAL | Status: AC
  Administered 2023-02-27: 2.5 mg via ORAL
  Filled 2023-02-27: qty 1

## 2023-02-27 MED ORDER — AMIODARONE HCL IN DEXTROSE 360-4.14 MG/200ML-% IV SOLN
30.0000 mg/h | INTRAVENOUS | Status: DC
  Administered 2023-02-27 – 2023-03-02 (×8): 30 mg/h via INTRAVENOUS
  Filled 2023-02-27 (×8): qty 200

## 2023-02-27 NOTE — Progress Notes (Addendum)
Patient ID: [name], male   DOB: [birth year], 77 y.o.   MRN: [id number]     Advanced Heart Failure Rounding Note  PCP-Cardiologist: Kristeen Miss, MD   Subjective:    - 4/20 while on dobutamine patient went into atrial flutter with RVR, became hypotensive.  Decision made to start amiodarone and transferred to CVICU. - 4/22 CO-OX marginal. Milrinone increased to 0.25 mcg. Diuresed with IV lasix. - 4/24 RHC with elevated filling pressures, transitioned to Lasix gtt at 12 mg per hour.  - 4/25 Epistaxis requiring packing by ENT. Went back into AFL  - 4/29 Nasal Packing removed; TEE/DCCV => NSR, EF 25%, RV mod HK. No clot. Mod AI  -5/2 Switched back to lasix drip and given metolazone.   Sluggish diuresis noted.   Currently on lasix drip at 20 mg per hour.   CO-OX 48.5% -->repeating  Creatinine 2.36>2.6  Feels ok. Denies SOB.    Objective:   Weight Range: 79.6 kg Body mass index is 25.17 kg/m.   Vital Signs:   Temp:  [97.3 F (36.3 C)-98.2 F (36.8 C)] 98 F (36.7 C) (05/03 0724) Pulse Rate:  [61-75] 74 (05/03 0748) Resp:  [18-20] 18 (05/03 0748) BP: (105-126)/(52-79) 113/58 (05/03 0724) SpO2:  [94 %-100 %] 95 % (05/03 0748) Weight:  [79.6 kg] 79.6 kg (05/03 0434) Last BM Date :  (02/26/2023)  Weight change: Filed Weights   02/25/23 0105 02/26/23 0025 02/27/23 0434  Weight: 79.1 kg 79.1 kg 79.6 kg    Intake/Output:   Intake/Output Summary (Last 24 hours) at 02/27/2023 0816 Last data filed at 02/27/2023 0500 Gross per 24 hour  Intake 1150 ml  Output 1550 ml  Net -400 ml   CVP 15-16  Physical Exam  General:  Sitting on the side of the bed. No resp difficulty HEENT: normal Neck: supple. JVP to jaw . Carotids 2+ bilat; no bruits. No lymphadenopathy or thryomegaly appreciated. Cor: PMI nondisplaced. Regular rate & rhythm. No rubs, gallops or murmurs. Lungs: clear Abdomen: soft, nontender, distended. No hepatosplenomegaly. No bruits or masses. Good bowel  sounds. Extremities: no cyanosis, clubbing, rash, R and LLE 1+ edema. RUE PICC  Neuro: alert & orientedx3, cranial nerves grossly intact. moves all 4 extremities w/o difficulty. Affect pleasant   Telemetry   SR 1st degree heart block  70-80s   EKG    No new EKG to review  Labs    CBC Recent Labs    02/26/23 0726 02/27/23 0635  WBC 4.9 5.8  HGB 10.5* 10.7*  HCT 34.1* 33.8*  MCV 87.2 85.8  PLT 203 234    Basic Metabolic Panel Recent Labs    19/14/78 0726 02/27/23 0635  NA 136 133*  K 3.9 4.1  CL 99 97*  CO2 25 24  GLUCOSE 173* 230*  BUN 71* 80*  CREATININE 2.36* 2.63*  CALCIUM 9.2 8.9  MG 2.3 2.2  PHOS  --  3.1   BNP (last 3 results) Recent Labs    01/20/23 1357 02/02/23 1130 02/10/23 1302  BNP 1,939.2* 2,275.8* 2,508.7*      Imaging    No results found.   Medications:     Scheduled Medications:  amiodarone  200 mg Oral BID   apixaban  5 mg Oral BID   Chlorhexidine Gluconate Cloth  6 each Topical Daily   diclofenac Sodium  2 g Topical QID   fluticasone furoate-vilanterol  1 puff Inhalation Daily   hydrALAZINE  37.5 mg Oral Q8H   insulin  aspart  0-5 Units Subcutaneous QHS   insulin aspart  0-9 Units Subcutaneous TID WC   insulin aspart  6 Units Subcutaneous TID WC   insulin glargine-yfgn  35 Units Subcutaneous Daily   isosorbide mononitrate  30 mg Oral Daily   linaclotide  72 mcg Oral q morning   pantoprazole  40 mg Oral Daily   potassium chloride  20 mEq Oral Daily   rosuvastatin  20 mg Oral QHS   sodium chloride flush  10-40 mL Intracatheter Q12H   sodium chloride flush  3 mL Intravenous Q12H   umeclidinium bromide  1 puff Inhalation Daily    Infusions:  furosemide (LASIX) 200 mg in dextrose 5 % 100 mL (2 mg/mL) infusion 20 mg/hr (02/26/23 1719)     PRN Medications: acetaminophen, ALPRAZolam, ipratropium-albuterol, mouth rinse, phenol, saline, sodium chloride, sodium chloride flush, traZODone   Patient Profile   [name] is  a 77 y.o. male with CAD c/p CABG 2003, HFrEF, chronic DOE, Rt CEA >45yrs ago, Lt CEA 14', HTN, DM2, HLD, PAD w/ Rt SFA and Rt politeal artery vascular stents 15', prostate cancer. Admitted with a/c HFrEF.  Assessment/Plan   1. Acute on chronic systolic CHF: Ischemic cardiomyopathy. Echo in 3/24 with EF 35-40%, RV normal. Echo this admission with EF 30%, moderately decreased RV dysfunction with mild RVE, PASP 63 mmHg, IVC dilated. Low output HF this admission, has required inotrope. RHC today showed ongoing volume overload with stable cardiac output on milrinone 0.25. Prominent v-waves but only mild MR on echo (likely due to volume overload/diastolic dysfunction). GDMT limited by hypotension/AKI. Milrinone stopped 02/21/23. CO-OX trending down 48.5%. Repeat now. If CO-OX remains low will need to restart.  CVP 15-17.  Continue lasix drip and give metolazone.  - GDMT limited by renal fx  - continue hydralazine 37.5 mg tid and Imdur 30 daily.   2.  Atrial flutter: Rate controlled.  Amiodarone IV stopped 4/27 with prolonged QTc and po started.  QTc was shorter on telemetry. S/p TEE/DCCV 4/29. Maintaining NSR this morning. -continue po amiodarone 200 mg bid.  -Continue Eliquis.  3. CAD: Status post CABG 2003.  No ACS this admission. No chest pain.  - Continue Crestor.  - No coronary angiography with no ACS and AKI.  4.Carotid Disease: S/p b/l CEA - continue statin 5. PAD: s/p intervention 2015 Rt SFA and Rt politeal artery vascular stents - continue statin  6. Type 2 DM: Per [facility].   On SSI  7. COPD/emphysema: Uses 2 L home O2 as needed, requiring oxygen here.  8. AKI on CKD stage 4: Baseline creatinine ~ 2.2.  Creatinine trending up off milrinone. 2.36>2.66  9. Epistaxis: 4/24 Transexamic used + Afrin.  Nasal packing 4/25, removed 4/29. No further visible bleeding. Hgb stable.  - monitor closely w/ Eliquis restart - appreciate ENT  10: GOC  DNR. If he is unable to come off milrinone will need to  involve Palliative Care for GOC.     Strict I&O. Discussed nursing staff.   CO-OX repeated  49.5-->50%. As above worsening renal function.CVP 16-17. Will start low dose milrinone. Continue to diurese. Discussed at length with him. Watch for recurrent A fib.    Length of Stay: 16  [name] Clegg, NP  02/27/2023, 8:16 AM  Advanced Heart Failure Team Pager [phone] (M-F; 7a - 5p)  Please contact [facility] for night-coverage after hours (5p -7a ) and weekends on [email or web address]   Patient seen and examined with the above-signed Advanced Practice  Provider and/or Housestaff. I personally reviewed laboratory data, imaging studies and relevant notes. I independently examined the patient and formulated the important aspects of the plan. I have edited the note to reflect any of my changes or salient points. I have personally discussed the plan with the patient and/or family.  Minimal diuresis on lasix gtt. Co-ox remains low. More bloated and SOB. Weight up 1 pound.  General:  Weak appearing. +SOB HEENT: normal Neck: supple. JVP to ear with prominent v-waves Carotids 2+ bilat; no bruits. No lymphadenopathy or thryomegaly appreciated. Cor: PMI nondisplaced. Regular rate & rhythm. +s3 Lungs: clear Abdomen: soft, nontender, + distended. No hepatosplenomegaly. No bruits or masses. Good bowel sounds. Extremities: no cyanosis, clubbing, rash, 2+ edema Neuro: alert & orientedx3, cranial nerves grossly intact. moves all 4 extremities w/o difficulty. Affect pleasant  He is back in low output HF with volume overload. Will restart milrinone. Continue lasix gtt. Watch closely for recurrent AF. Will restart IV amio while on milrinone. Continue Eliquis.   Arvilla Meres, MD  12:14 PM

## 2023-02-27 NOTE — Care Management Important Message (Signed)
Important Message  Patient Details  Name: [name] MRN: [id number] Date of Birth: [birth year]   Medicare Important Message Given:  Yes     Dorena Bodo 02/27/2023, 3:19 PM

## 2023-02-27 NOTE — Progress Notes (Signed)
Mobility Specialist Progress Note:   02/27/23 0930  Mobility  Activity Ambulated with assistance in hallway  Level of Assistance Standby assist, set-up cues, supervision of patient - no hands on  Assistive Device Other (Comment) (IV Pole)  Distance Ambulated (ft) 400 ft  Activity Response Tolerated well  Mobility Referral Yes  $Mobility charge 1 Mobility   Pt agreeable to mobility session, agitated about prolonged hospital stay. No physical assistance throughout. SpO2 WFL on 2LO2. Pt back sitting EOB with all needs met.   Addison Lank Mobility Specialist Please contact via SecureChat or  Rehab office at [phone]

## 2023-02-27 NOTE — Progress Notes (Signed)
Progress Note   Patient: [name]:[id number] DOB: Sep 27, 1946 DOA: 02/10/2023     16 DOS: the patient was seen and examined on 02/27/2023   Brief hospital course: [name] was admitted to the hospital with the working diagnosis of heart failure exacerbation.   77 yo male with the past medical history of heart failure, T2DM, CKD, COPD chronic hypoxemic respiratory failure and hypertension who presented with lower extremity edema. Recent hospitalization 03/26 to 01/24/23 for heart failure exacerbation. He was discharged with 20 mg furosemide. At home he developed worsening lower extremity edema. Cardiology outpatient follow up he had gain 5 lbs from the time of his discharge. At home he continue to have worsening edema, and weight gain for 9 lbs more. On his initial physical examination his blood pressure was 129/82, HR 86, RR 20 and 02 saturation 92%, lungs with bilateral rales, more left than right, positive JVD, heart with S1 and S2 present and rhythmic, abdomen with no distention, positive lower extremity edema pitting.   Na 135, K 4,0 Cl 102 bicarbonate 24 glucose 169, bun 49 cr 2,32  BNP 2,508  Wbc 5,9 hgb 11,8 plt 184   Chest radiograph with cardiomegaly, bilateral hilar vascular congestion, with bilateral interstitial infiltrates, small bilateral pleural effusions, and fluid in the right fissure.  Sternotomy wires in place.   EKG 82 bpm, normal axis, right bundle branch block, sinus rhythm with 1st degree AV block, with no significant ST segment or  T wave changes.   Patient has been placed on furosemide for diuresis.   04/19 volume status is improving but not yet back to baseline.  Patient placed on dobutamine infusion.  04/20 patient developed VT and was transferred to the intensive care unit 2H per cardiology recommendations.   04/21: CVP 14, still expressing dyspnea on exertion and demonstrating signs of fluid overload.  Rate controlled with amiodarone drip.  Following  cardiology/heart failure service recommendation milrinone has been started.  Continue IV diuresis. 04/22 patient on milrinone infusion.  04/23 patient with improvement in volume status but not back to baseline. Plan for right heart catheterization.  4/24: RHC with elevated filling pressures, wedge of 24, cardiac output 4.7 on milrinone -4/24: Recurrent epistaxis, treated with Afrin and tranexamic acid, Eliquis held -4/26: Persistent epistaxis ENT consulted anterior/posterior nasal packing performed by Dr. Suszanne Conners -4/27, amiodarone changed to p.o. -4/29, TEE/DCCV> NSR  05/03 recurrent volume overload and signs of low output failure. Placed back on amiodarone infusion.   Assessment and Plan: * Acute on chronic systolic CHF (congestive heart failure) (HCC) -Echocardiogram with reduced LV systolic function with EF 35 to 40%, no LVH, RV systolic function preserved, RVSP 45,5 mid dilatation of LA, no pericardial effusion, mild to moderate TR, mild aortic stenosis.  Low output heart failure.   Urine output is 1,550 ml  Systolic blood pressure is 105 to 119 mmHg.  SV02 49  Changed to furosemide drip 20 mg/hr.  One dose of metolazone 2,5 mg   Continue after load reduction with hydralazine and isosorbide.  Resumed amiodarone for signs of recurrent low output failure.   Acute on chronic hypoxemic respiratory failure due to cardiogenic pulmonary edema.  02 saturation today 97% on 2 L/min per [place].   Acute kidney injury superimposed on chronic kidney disease (HCC) -CKD stage 3a at baseline Hyponatremia.   Worsening renal function with serum cr at 2,63 with K at 4,1 and serum bicarbonate at 24.  Na 133. Mg 2.2   Kcl supplementation to  prevent hypokalemia.  Continue diuresis, and follow up renal function in am.   Atrial flutter (HCC) 04/29. Sp direct current cardioversion, returning to sinus rhythm.   Now on amiodarone drip to prevent RVR while on milrinone.  Ectopy has improved,  Continue  with oral amiodarone and anticoagulation with apixaban.   Epistaxis, has improved. Required packing per ENT.    GERD (gastroesophageal reflux disease) -Continue protonix 40 mg daily.  Type 2 diabetes mellitus with hyperlipidemia (HCC) -Uncontrolled T2DM with hyperglycemia.   Continue sliding scale insulin and  basal long-acting regimen Pre meal insulin 6 units.  Fasting glucose this am 173   Continue with statin therapy.   COPD (chronic obstructive pulmonary disease) (HCC) -No clinical signs of exacerbation. -Continue current medical management.   DNR (do not resuscitate) -Verified pt is a DNR. Pt is emphatic that he is a DNR/DNI. -[name] at bedside also confirming code Status.        Subjective: Patient with dyspnea and fatigue, no chest pain, no nausea or vomiting, no epistaxis.   Physical Exam: Vitals:   02/27/23 0434 02/27/23 0724 02/27/23 0748 02/27/23 1053  BP: 126/60 (!) 113/58  105/63  Pulse: 71 73 74   Resp: 20 18 18 18   Temp: 98.2 F (36.8 C) 98 F (36.7 C)  (!) 97.4 F (36.3 C)  TempSrc: Oral Oral  Oral  SpO2: 96% 95% 95% 97%  Weight: 79.6 kg     Height:       Neurology awake and alert ENT with mild pallor Cardiovascular with S1 and S2 present with no gallops, rubs or murmurs Positive JVD Trace lower extremity edema, compression socks in place Respiratory with no wheezing, mild rales at bases Abdomen with no distention  Data Reviewed:    Family Communication: no family at the bedside   Disposition: Status is: Inpatient Remains inpatient appropriate because: IV diuresis.   Planned Discharge Destination: Home      Author: Coralie Keens, MD 02/27/2023 1:03 PM  For on call review [email or web address].

## 2023-02-28 DIAGNOSIS — I4892 Unspecified atrial flutter: Secondary | ICD-10-CM | POA: Diagnosis not present

## 2023-02-28 DIAGNOSIS — I5023 Acute on chronic systolic (congestive) heart failure: Secondary | ICD-10-CM | POA: Diagnosis not present

## 2023-02-28 DIAGNOSIS — N179 Acute kidney failure, unspecified: Secondary | ICD-10-CM | POA: Diagnosis not present

## 2023-02-28 DIAGNOSIS — E1169 Type 2 diabetes mellitus with other specified complication: Secondary | ICD-10-CM | POA: Diagnosis not present

## 2023-02-28 LAB — GLUCOSE, CAPILLARY
Glucose-Capillary: 156 mg/dL — ABNORMAL HIGH (ref 70–99)
Glucose-Capillary: 199 mg/dL — ABNORMAL HIGH (ref 70–99)
Glucose-Capillary: 92 mg/dL (ref 70–99)
Glucose-Capillary: 101 mg/dL — ABNORMAL HIGH (ref 70–99)

## 2023-02-28 LAB — COOXEMETRY PANEL
Carboxyhemoglobin: 1.8 % — ABNORMAL HIGH (ref 0.5–1.5)
Methemoglobin: <0.7 % (ref 0.0–1.5)
O2 Saturation: 53.9 %
Total hemoglobin: 10.4 g/dL — ABNORMAL LOW (ref 12.0–16.0)

## 2023-02-28 LAB — MAGNESIUM: Magnesium: 2 mg/dL (ref 1.7–2.4)

## 2023-02-28 LAB — BASIC METABOLIC PANEL
Anion gap: 14 (ref 5–15)
BUN: 77 mg/dL — ABNORMAL HIGH (ref 8–23)
CO2: 25 mmol/L (ref 22–32)
Calcium: 9.1 mg/dL (ref 8.9–10.3)
Chloride: 93 mmol/L — ABNORMAL LOW (ref 98–111)
Creatinine, Ser: 2.61 mg/dL — ABNORMAL HIGH (ref 0.61–1.24)
GFR, Estimated: 25 mL/min — ABNORMAL LOW (ref >60)
Glucose, Bld: 161 mg/dL — ABNORMAL HIGH (ref 70–99)
Potassium: 3.4 mmol/L — ABNORMAL LOW (ref 3.5–5.1)
Sodium: 132 mmol/L — ABNORMAL LOW (ref 135–145)

## 2023-02-28 MED ORDER — METOLAZONE 2.5 MG PO TABS
2.5000 mg | ORAL_TABLET | Freq: Once | ORAL | Status: AC
  Administered 2023-02-28: 2.5 mg via ORAL
  Filled 2023-02-28: qty 1

## 2023-02-28 MED ORDER — POTASSIUM CHLORIDE CRYS ER 20 MEQ PO TBCR
40.0000 meq | EXTENDED_RELEASE_TABLET | Freq: Once | ORAL | Status: AC
  Administered 2023-02-28: 40 meq via ORAL
  Filled 2023-02-28: qty 2

## 2023-02-28 NOTE — Progress Notes (Signed)
Progress Note   Patient: [name] NFA:[id number] DOB: [birth year] DOA: 02/10/2023     17 DOS: the patient was seen and examined on 02/28/2023   Brief hospital course: [name] was admitted to the hospital with the working diagnosis of heart failure exacerbation.   77 yo male with the past medical history of heart failure, T2DM, CKD, COPD chronic hypoxemic respiratory failure and hypertension who presented with lower extremity edema. Recent hospitalization 03/26 to 01/24/23 for heart failure exacerbation. He was discharged with 20 mg furosemide. At home he developed worsening lower extremity edema. Cardiology outpatient follow up he had gain 5 lbs from the time of his discharge. At home he continue to have worsening edema, and weight gain for 9 lbs more. On his initial physical examination his blood pressure was 129/82, HR 86, RR 20 and 02 saturation 92%, lungs with bilateral rales, more left than right, positive JVD, heart with S1 and S2 present and rhythmic, abdomen with no distention, positive lower extremity edema pitting.   Na 135, K 4,0 Cl 102 bicarbonate 24 glucose 169, bun 49 cr 2,32  BNP 2,508  Wbc 5,9 hgb 11,8 plt 184   Chest radiograph with cardiomegaly, bilateral hilar vascular congestion, with bilateral interstitial infiltrates, small bilateral pleural effusions, and fluid in the right fissure.  Sternotomy wires in place.   EKG 82 bpm, normal axis, right bundle branch block, sinus rhythm with 1st degree AV block, with no significant ST segment or  T wave changes.   Patient has been placed on furosemide for diuresis.   04/19 volume status is improving but not yet back to baseline.  Patient placed on dobutamine infusion.  04/20 patient developed VT and was transferred to the intensive care unit 2H per cardiology recommendations.   04/21: CVP 14, still expressing dyspnea on exertion and demonstrating signs of fluid overload.  Rate controlled with amiodarone drip.  Following  cardiology/heart failure service recommendation milrinone has been started.  Continue IV diuresis. 04/22 patient on milrinone infusion.  04/23 patient with improvement in volume status but not back to baseline. Plan for right heart catheterization.  4/24: RHC with elevated filling pressures, wedge of 24, cardiac output 4.7 on milrinone -4/24: Recurrent epistaxis, treated with Afrin and tranexamic acid, Eliquis held -4/26: Persistent epistaxis ENT consulted anterior/posterior nasal packing performed by Dr. Suszanne Conners -4/27, amiodarone changed to p.o. -4/29, TEE/DCCV> NSR  05/03 recurrent volume overload and signs of low output failure. Placed back on amiodarone infusion.  05/04 volume status improving with inotropic support.   Assessment and Plan: * Acute on chronic systolic CHF (congestive heart failure) (HCC) -Echocardiogram with reduced LV systolic function with EF 35 to 40%, no LVH, RV systolic function preserved, RVSP 45,5 mid dilatation of LA, no pericardial effusion, mild to moderate TR, mild aortic stenosis.  Low output heart failure.   Urine output is 3000 ml  Systolic blood pressure is 117 to 121 mmHg.  SV02 53.9   Continue diuresis with furosemide drip 20 mg/hr.  05/03 metolazone 2,5 mg   Continue after load reduction with hydralazine and isosorbide.  Resumed amiodarone for signs of recurrent low output failure.   Acute on chronic hypoxemic respiratory failure due to cardiogenic pulmonary edema.  02 saturation today 97% on 3 L/min per [place]. \  Patient may need inotropic home infusion at the time of his discharge.   Acute kidney injury superimposed on chronic kidney disease (HCC) -CKD stage 3a at baseline Hyponatremia.   Volume status is improving. Renal  function today with serum cr at 2,61 with K at 3,4 and serum bicarbonate at 25.  Na 132 and Mg 2.0   Plan to continue diuresis and K supplementation.  Follow up renal function in am.   Atrial flutter (HCC) 04/29. Sp  direct current cardioversion, returning to sinus rhythm.   Now on amiodarone drip to prevent RVR while on milrinone.  Ectopy has improved,  Anticoagulation with apixaban.   Epistaxis, has improved. Required packing per ENT.    GERD (gastroesophageal reflux disease) -Continue protonix 40 mg daily.  Type 2 diabetes mellitus with hyperlipidemia (HCC) -Uncontrolled T2DM with hyperglycemia.   Continue sliding scale insulin and  basal long-acting regimen Pre meal insulin 6 units.   Continue with statin therapy.   COPD (chronic obstructive pulmonary disease) (HCC) -No clinical signs of exacerbation. -Continue current medical management.   DNR (do not resuscitate) -Verified pt is a DNR. Pt is emphatic that he is a DNR/DNI. -Daughter at bedside also confirming code Status.        Subjective: patient this morning is feeling better, dyspnea has improved, no fatigue.   Physical Exam: Vitals:   02/28/23 0335 02/28/23 0621 02/28/23 0742 02/28/23 0856  BP: (!) 120/48 (!) 121/48  (!) 117/52  Pulse: 79   75  Resp: 18   20  Temp: (!) 97.4 F (36.3 C)   98.5 F (36.9 C)  TempSrc: Oral   Oral  SpO2: 92%  96% 94%  Weight: 78.4 kg     Height:       Neurology awake and alert ENT with mild pallor Cardiovascular with S1 and S2 present and rhythmic, no gallops, or murmurs Mild JVD Trace lower extremity edema, ted hose in place,  Respiratory with no rales or wheezing, no rhonchi Abdomen with no distention   Data Reviewed:    Family Communication: I spoke with patient's daughter at the bedside, we talked in detail about patient's condition, plan of care and prognosis and all questions were addressed.   Disposition: Status is: Inpatient Remains inpatient appropriate because: Inotropic support and IV diuresis,   Planned Discharge Destination: Home      Author: Coralie Keens, MD 02/28/2023 9:17 AM  For on call review [email or web address].

## 2023-02-28 NOTE — Progress Notes (Signed)
Advanced Heart Failure Rounding Note  PCP-Cardiologist: Kristeen Miss, MD   Subjective:    - 4/20 while on dobutamine patient went into atrial flutter with RVR, became hypotensive.  Decision made to start amiodarone and transferred to CVICU. - 4/22 CO-OX marginal. Milrinone increased to 0.25 mcg. Diuresed with IV lasix. - 4/24 RHC with elevated filling pressures, transitioned to Lasix gtt at 12 mg per hour.  - 4/25 Epistaxis requiring packing by ENT. Went back into AFL  - 4/29 Nasal Packing removed; TEE/DCCV => NSR, EF 25%, RV mod HK. No clot. Mod AI  - 5/2 Switched back to lasix drip and given metolazone.  - 5/3 Milrinone 0.125 restarted.   Good diuresis yesterday.  Currently on lasix drip at 20 mg per hour. He is on milrinone 0.125, co-ox 54% today. CVP 18.   Creatinine 2.36>2.62>2.6  He remains in NSR on amiodarone gtt 30 mg/hr.   Breathing is getting better.    Objective:   Weight Range: 78.4 kg Body mass index is 24.81 kg/m.   Vital Signs:   Temp:  [97.4 F (36.3 C)-98.5 F (36.9 C)] 98.5 F (36.9 C) (05/04 0856) Pulse Rate:  [67-79] 75 (05/04 0856) Resp:  [18-20] 20 (05/04 0856) BP: (105-124)/(48-63) 117/52 (05/04 0856) SpO2:  [92 %-99 %] 94 % (05/04 0856) Weight:  [78.4 kg] 78.4 kg (05/04 0335) Last BM Date : 02/27/23  Weight change: Filed Weights   02/26/23 0025 02/27/23 0434 02/28/23 0335  Weight: 79.1 kg 79.6 kg 78.4 kg    Intake/Output:   Intake/Output Summary (Last 24 hours) at 02/28/2023 1005 Last data filed at 02/28/2023 0615 Gross per 24 hour  Intake 1018.28 ml  Output 2725 ml  Net -1706.72 ml   CVP 18 Physical Exam  General: NAD Neck: JVP 16+, no thyromegaly or thyroid nodule.  Lungs: Clear to auscultation bilaterally with normal respiratory effort. CV: Lateral PMI.  Heart regular S1/S2, no S3/S4, no murmur.  1+ edema to knees.  Abdomen: Soft, nontender, no hepatosplenomegaly, no distention.  Skin: Intact without lesions or rashes.   Neurologic: Alert and oriented x 3.  Psych: Normal affect. Extremities: No clubbing or cyanosis.  HEENT: Normal.    Telemetry   NSR 80s (personally reviewed)  EKG    No new EKG to review  Labs    CBC Recent Labs    02/26/23 0726 02/27/23 0635  WBC 4.9 5.8  HGB 10.5* 10.7*  HCT 34.1* 33.8*  MCV 87.2 85.8  PLT 203 234    Basic Metabolic Panel Recent Labs    16/10/96 0635 02/28/23 0520  NA 133* 132*  K 4.1 3.4*  CL 97* 93*  CO2 24 25  GLUCOSE 230* 161*  BUN 80* 77*  CREATININE 2.63* 2.61*  CALCIUM 8.9 9.1  MG 2.2 2.0  PHOS 3.1  --    BNP (last 3 results) Recent Labs    01/20/23 1357 02/02/23 1130 02/10/23 1302  BNP 1,939.2* 2,275.8* 2,508.7*      Imaging    No results found.   Medications:     Scheduled Medications:  apixaban  5 mg Oral BID   Chlorhexidine Gluconate Cloth  6 each Topical Daily   diclofenac Sodium  2 g Topical QID   fluticasone furoate-vilanterol  1 puff Inhalation Daily   hydrALAZINE  37.5 mg Oral Q8H   insulin aspart  0-5 Units Subcutaneous QHS   insulin aspart  0-9 Units Subcutaneous TID WC   insulin aspart  6 Units Subcutaneous  TID WC   insulin glargine-yfgn  35 Units Subcutaneous Daily   isosorbide mononitrate  30 mg Oral Daily   linaclotide  72 mcg Oral q morning   pantoprazole  40 mg Oral Daily   potassium chloride  20 mEq Oral Daily   potassium chloride  40 mEq Oral Once   rosuvastatin  20 mg Oral QHS   sodium chloride flush  10-40 mL Intracatheter Q12H   sodium chloride flush  3 mL Intravenous Q12H   umeclidinium bromide  1 puff Inhalation Daily    Infusions:  amiodarone 30 mg/hr (02/27/23 2339)   furosemide (LASIX) 200 mg in dextrose 5 % 100 mL (2 mg/mL) infusion 20 mg/hr (02/28/23 0424)   milrinone 0.125 mcg/kg/min (02/27/23 1201)     PRN Medications: acetaminophen, ALPRAZolam, ipratropium-albuterol, mouth rinse, phenol, saline, sodium chloride, sodium chloride flush, traZODone   Patient Profile    [name] is a 77 y.o. male with CAD c/p CABG 2003, HFrEF, chronic DOE, Rt CEA >85yrs ago, Lt CEA 14', HTN, DM2, HLD, PAD w/ Rt SFA and Rt politeal artery vascular stents 15', prostate cancer. Admitted with a/c HFrEF.  Assessment/Plan   1. Acute on chronic systolic CHF: Ischemic cardiomyopathy. Echo in 3/24 with EF 35-40%, RV normal. Echo this admission with EF 30%, moderately decreased RV dysfunction with mild RVE, PASP 63 mmHg, IVC dilated. Low output HF this admission, has required inotrope. RHC today showed ongoing volume overload with stable cardiac output on milrinone 0.25. Prominent v-waves but only mild MR on echo (likely due to volume overload/diastolic dysfunction). GDMT limited by hypotension/AKI. Milrinone stopped 02/21/23. CO-OX trended down 48.5%. Milrinone 0.125 restarted on 5/3.  He diuresed well yesterday on Lasix gtt 20 mg/hr, creatinine stable and BUN lower.  Co-ox still marginal at 54%.  CVP 18 today.  - Continue Lasix gtt 20 mg/hr and will give metolazone 2.5 x 1.  - GDMT limited by renal fx  - Continue hydralazine 37.5 mg tid and Imdur 30 daily.   - Long-term prognosis worrisome, not candidate for LVAD with renal dysfunction. Not tolerating milrinone wean.  2.  Atrial flutter: Rate controlled.  Amiodarone IV stopped 4/27 with prolonged QTc and po started.  QTc was shorter on telemetry. S/p TEE/DCCV 4/29. Maintaining NSR.  - Continue IV amiodarone while on milrinone.  - Continue Eliquis.  3. CAD: Status post CABG 2003.  No ACS this admission. No chest pain.  - Continue Crestor.  - No coronary angiography with no ACS and AKI.  4.Carotid Disease: S/p b/l CEA - continue statin 5. PAD: s/p intervention 2015 Rt SFA and Rt politeal artery vascular stents - continue statin  6. Type 2 DM: Per [facility].   - On SSI  7. COPD/emphysema: Uses 2 L home O2 as needed, requiring oxygen here.  8. AKI on CKD stage 4: Baseline creatinine ~ 2.2.  Creatinine trending up off milrinone, now  restarted with stabilization. 2.36>2.66>2.6. 9. Epistaxis: 4/24 Transexamic used + Afrin.  Nasal packing 4/25, removed 4/29. No further visible bleeding. Hgb stable.  - monitor closely w/ Eliquis restart - appreciate ENT  10: GOC: DNR. If he is unable to come off milrinone will need to involve Palliative Care for GOC.    Connor Ancona, MD  10:05 AM

## 2023-03-01 DIAGNOSIS — N179 Acute kidney failure, unspecified: Secondary | ICD-10-CM | POA: Diagnosis not present

## 2023-03-01 DIAGNOSIS — K219 Gastro-esophageal reflux disease without esophagitis: Secondary | ICD-10-CM | POA: Diagnosis not present

## 2023-03-01 DIAGNOSIS — I5023 Acute on chronic systolic (congestive) heart failure: Secondary | ICD-10-CM | POA: Diagnosis not present

## 2023-03-01 DIAGNOSIS — I4892 Unspecified atrial flutter: Secondary | ICD-10-CM | POA: Diagnosis not present

## 2023-03-01 LAB — GLUCOSE, CAPILLARY
Glucose-Capillary: 109 mg/dL — ABNORMAL HIGH (ref 70–99)
Glucose-Capillary: 230 mg/dL — ABNORMAL HIGH (ref 70–99)
Glucose-Capillary: 109 mg/dL — ABNORMAL HIGH (ref 70–99)
Glucose-Capillary: 162 mg/dL — ABNORMAL HIGH (ref 70–99)

## 2023-03-01 LAB — MAGNESIUM: Magnesium: 2.2 mg/dL (ref 1.7–2.4)

## 2023-03-01 LAB — COOXEMETRY PANEL
Carboxyhemoglobin: 1.6 % — ABNORMAL HIGH (ref 0.5–1.5)
Methemoglobin: <0.7 % (ref 0.0–1.5)
O2 Saturation: 59.5 %
Total hemoglobin: 10.6 g/dL — ABNORMAL LOW (ref 12.0–16.0)

## 2023-03-01 LAB — BASIC METABOLIC PANEL
Anion gap: 16 — ABNORMAL HIGH (ref 5–15)
BUN: 80 mg/dL — ABNORMAL HIGH (ref 8–23)
CO2: 24 mmol/L (ref 22–32)
Calcium: 9.1 mg/dL (ref 8.9–10.3)
Chloride: 92 mmol/L — ABNORMAL LOW (ref 98–111)
Creatinine, Ser: 2.77 mg/dL — ABNORMAL HIGH (ref 0.61–1.24)
GFR, Estimated: 23 mL/min — ABNORMAL LOW (ref >60)
Glucose, Bld: 140 mg/dL — ABNORMAL HIGH (ref 70–99)
Potassium: 3.4 mmol/L — ABNORMAL LOW (ref 3.5–5.1)
Sodium: 132 mmol/L — ABNORMAL LOW (ref 135–145)

## 2023-03-01 MED ORDER — METOLAZONE 2.5 MG PO TABS
2.5000 mg | ORAL_TABLET | Freq: Once | ORAL | Status: AC
  Administered 2023-03-01: 2.5 mg via ORAL
  Filled 2023-03-01: qty 1

## 2023-03-01 MED ORDER — POTASSIUM CHLORIDE CRYS ER 20 MEQ PO TBCR
40.0000 meq | EXTENDED_RELEASE_TABLET | Freq: Two times a day (BID) | ORAL | Status: AC
  Administered 2023-03-01 (×2): 40 meq via ORAL
  Filled 2023-03-01 (×2): qty 2

## 2023-03-01 NOTE — Progress Notes (Signed)
Patient ID: [name], male   DOB: October 22, 1946, 77 y.o.   MRN: [id number]     Advanced Heart Failure Rounding Note  PCP-Cardiologist: Connor Miss, MD   Subjective:    - 4/20 while on dobutamine patient went into atrial flutter with RVR, became hypotensive.  Decision made to start amiodarone and transferred to CVICU. - 4/22 CO-OX marginal. Milrinone increased to 0.25 mcg. Diuresed with IV lasix. - 4/24 RHC with elevated filling pressures, transitioned to Lasix gtt at 12 mg per hour.  - 4/25 Epistaxis requiring packing by ENT. Went back into AFL  - 4/29 Nasal Packing removed; TEE/DCCV => NSR, EF 25%, RV mod HK. No clot. Mod AI  - 5/2 Switched back to lasix drip and given metolazone.  - 5/3 Milrinone 0.125 restarted.   Good diuresis yesterday, weight down 2.5 lbs.  Currently on lasix drip at 20 mg per hour. He is on milrinone 0.125, co-ox 60% today. CVP lower at 13.   Creatinine 2.36>2.62>2.6>2.77  He remains in NSR on amiodarone gtt 30 mg/hr.   Breathing is getting better.    Objective:   Weight Range: 77.2 kg Body mass index is 24.44 kg/m.   Vital Signs:   Temp:  [97.4 F (36.3 C)-98.3 F (36.8 C)] 97.4 F (36.3 C) (05/05 1124) Pulse Rate:  [65-72] 72 (05/05 1124) Resp:  [18-20] 18 (05/05 1124) BP: (114-135)/(46-69) 131/58 (05/05 1124) SpO2:  [93 %-97 %] 97 % (05/05 1124) Weight:  [77.2 kg] 77.2 kg (05/05 0500) Last BM Date : 02/27/23  Weight change: Filed Weights   02/27/23 0434 02/28/23 0335 03/01/23 0500  Weight: 79.6 kg 78.4 kg 77.2 kg    Intake/Output:   Intake/Output Summary (Last 24 hours) at 03/01/2023 1157 Last data filed at 03/01/2023 1122 Gross per 24 hour  Intake 1484.9 ml  Output 3900 ml  Net -2415.1 ml   CVP 13 Physical Exam  General: NAD Neck: JVP 12, no thyromegaly or thyroid nodule.  Lungs: Clear to auscultation bilaterally with normal respiratory effort. CV: Nondisplaced PMI.  Heart regular S1/S2, no S3/S4, no murmur.  1+ ankle edema.    Abdomen: Soft, nontender, no hepatosplenomegaly, no distention.  Skin: Intact without lesions or rashes.  Neurologic: Alert and oriented x 3.  Psych: Normal affect. Extremities: No clubbing or cyanosis.  HEENT: Normal.    Telemetry   NSR 80s (personally reviewed)  EKG    No new EKG to review  Labs    CBC Recent Labs    02/27/23 0635  WBC 5.8  HGB 10.7*  HCT 33.8*  MCV 85.8  PLT 234    Basic Metabolic Panel Recent Labs    21/30/86 0635 02/28/23 0520 03/01/23 0514  NA 133* 132* 132*  K 4.1 3.4* 3.4*  CL 97* 93* 92*  CO2 24 25 24   GLUCOSE 230* 161* 140*  BUN 80* 77* 80*  CREATININE 2.63* 2.61* 2.77*  CALCIUM 8.9 9.1 9.1  MG 2.2 2.0 2.2  PHOS 3.1  --   --    BNP (last 3 results) Recent Labs    01/20/23 1357 02/02/23 1130 02/10/23 1302  BNP 1,939.2* 2,275.8* 2,508.7*      Imaging    No results found.   Medications:     Scheduled Medications:  apixaban  5 mg Oral BID   Chlorhexidine Gluconate Cloth  6 each Topical Daily   diclofenac Sodium  2 g Topical QID   fluticasone furoate-vilanterol  1 puff Inhalation Daily   hydrALAZINE  37.5 mg Oral Q8H   insulin aspart  0-5 Units Subcutaneous QHS   insulin aspart  0-9 Units Subcutaneous TID WC   insulin aspart  6 Units Subcutaneous TID WC   insulin glargine-yfgn  35 Units Subcutaneous Daily   isosorbide mononitrate  30 mg Oral Daily   linaclotide  72 mcg Oral q morning   metolazone  2.5 mg Oral Once   pantoprazole  40 mg Oral Daily   potassium chloride  20 mEq Oral Daily   potassium chloride  40 mEq Oral BID   rosuvastatin  20 mg Oral QHS   sodium chloride flush  10-40 mL Intracatheter Q12H   sodium chloride flush  3 mL Intravenous Q12H   umeclidinium bromide  1 puff Inhalation Daily    Infusions:  amiodarone 30 mg/hr (02/28/23 2303)   furosemide (LASIX) 200 mg in dextrose 5 % 100 mL (2 mg/mL) infusion 20 mg/hr (03/01/23 0126)   milrinone 0.125 mcg/kg/min (02/28/23 1637)     PRN  Medications: acetaminophen, ALPRAZolam, ipratropium-albuterol, mouth rinse, phenol, saline, sodium chloride, sodium chloride flush, traZODone   Patient Profile   [name] is a 77 y.o. male with CAD c/p CABG 2003, HFrEF, chronic DOE, Rt CEA >85yrs ago, Lt CEA 14', HTN, DM2, HLD, PAD w/ Rt SFA and Rt politeal artery vascular stents 15', prostate cancer. Admitted with a/c HFrEF.  Assessment/Plan   1. Acute on chronic systolic CHF: Ischemic cardiomyopathy. Echo in 3/24 with EF 35-40%, RV normal. Echo this admission with EF 30%, moderately decreased RV dysfunction with mild RVE, PASP 63 mmHg, IVC dilated. Low output HF this admission, has required inotrope. RHC today showed ongoing volume overload with stable cardiac output on milrinone 0.25. Prominent v-waves but only mild MR on echo (likely due to volume overload/diastolic dysfunction). GDMT limited by hypotension/AKI. Milrinone stopped 02/21/23. CO-OX trended down 48.5%. Milrinone 0.125 restarted on 5/3.  He diuresed well yesterday on Lasix gtt 20 mg/hr but creatinine higher at 2.6 => 2.77.  Co-ox 60%.  CVP 13 today.  - Continue Lasix gtt 20 mg/hr and will give metolazone 2.5 x 1 again today.  Replace K.  - GDMT limited by renal fx  - Continue hydralazine 37.5 mg tid and Imdur 30 daily.   - Long-term prognosis worrisome, not candidate for LVAD with renal dysfunction. Not tolerating milrinone wean.  He may need palliative home milrinone.  2.  Atrial flutter: Rate controlled.  Amiodarone IV stopped 4/27 with prolonged QTc and po started.  QTc was shorter on telemetry. S/p TEE/DCCV 4/29. Maintaining NSR.  - Continue IV amiodarone while on milrinone.  - Continue Eliquis.  3. CAD: Status post CABG 2003.  No ACS this admission. No chest pain.  - Continue Crestor.  - No coronary angiography with no ACS and AKI.  4.Carotid Disease: S/p b/l CEA - continue statin 5. PAD: s/p intervention 2015 Rt SFA and Rt politeal artery vascular stents - continue statin   6. Type 2 DM: Per [facility].   - On SSI  7. COPD/emphysema: Uses 2 L home O2 as needed, requiring oxygen here.  8. AKI on CKD stage 4: Baseline creatinine ~ 2.2.  Creatinine trended up off milrinone, now restarted with stabilization. 2.36>2.66>2.6>2.8. 9. Epistaxis: 4/24 Transexamic used + Afrin.  Nasal packing 4/25, removed 4/29. No further visible bleeding. Hgb stable.  - monitor closely w/ Eliquis restart - appreciate ENT  10: GOC: DNR. If he is unable to come off milrinone will need to involve Palliative Care for  GOC. May need palliative home milrinone.    Marca Ancona, MD  11:57 AM

## 2023-03-01 NOTE — Progress Notes (Signed)
Progress Note   Patient: [name] ZOX:[id number] DOB: 03-02-1946 DOA: 02/10/2023     18 DOS: the patient was seen and examined on 03/01/2023   Brief hospital course: [name] was admitted to the hospital with the working diagnosis of heart failure exacerbation.   77 yo male with the past medical history of heart failure, T2DM, CKD, COPD chronic hypoxemic respiratory failure and hypertension who presented with lower extremity edema. Recent hospitalization 03/26 to 01/24/23 for heart failure exacerbation. He was discharged with 20 mg furosemide. At home he developed worsening lower extremity edema. Cardiology outpatient follow up he had gain 5 lbs from the time of his discharge. At home he continue to have worsening edema, and weight gain for 9 lbs more. On his initial physical examination his blood pressure was 129/82, HR 86, RR 20 and 02 saturation 92%, lungs with bilateral rales, more left than right, positive JVD, heart with S1 and S2 present and rhythmic, abdomen with no distention, positive lower extremity edema pitting.   Na 135, K 4,0 Cl 102 bicarbonate 24 glucose 169, bun 49 cr 2,32  BNP 2,508  Wbc 5,9 hgb 11,8 plt 184   Chest radiograph with cardiomegaly, bilateral hilar vascular congestion, with bilateral interstitial infiltrates, small bilateral pleural effusions, and fluid in the right fissure.  Sternotomy wires in place.   EKG 82 bpm, normal axis, right bundle branch block, sinus rhythm with 1st degree AV block, with no significant ST segment or  T wave changes.   Patient has been placed on furosemide for diuresis.   04/19 volume status is improving but not yet back to baseline.  Patient placed on dobutamine infusion.  04/20 patient developed VT and was transferred to the intensive care unit 2H per cardiology recommendations.   04/21: CVP 14, still expressing dyspnea on exertion and demonstrating signs of fluid overload.  Rate controlled with amiodarone drip.  Following  cardiology/heart failure service recommendation milrinone has been started.  Continue IV diuresis. 04/22 patient on milrinone infusion.  04/23 patient with improvement in volume status but not back to baseline. Plan for right heart catheterization.  4/24: RHC with elevated filling pressures, wedge of 24, cardiac output 4.7 on milrinone -4/24: Recurrent epistaxis, treated with Afrin and tranexamic acid, Eliquis held -4/26: Persistent epistaxis ENT consulted anterior/posterior nasal packing performed by Dr. Suszanne Conners -4/27, amiodarone changed to p.o. -4/29, TEE/DCCV> NSR  05/03 recurrent volume overload and signs of low output failure. Placed back on amiodarone infusion.  05/04 volume status improving with inotropic support.  05/05 symptoms continue to improve with inotropic support.   Assessment and Plan: * Acute on chronic systolic CHF (congestive heart failure) (HCC) -Echocardiogram with reduced LV systolic function with EF 35 to 40%, no LVH, RV systolic function preserved, RVSP 45,5 mid dilatation of LA, no pericardial effusion, mild to moderate TR, mild aortic stenosis.  Low output heart failure.   Urine output is 3,525 ml  Systolic blood pressure is 120 to 130 mmHg.  SV02 59.5    Continue diuresis with furosemide drip 20 mg/hr.  05/03, 05/05  metolazone 2,5 mg   Continue after load reduction with hydralazine and isosorbide.  Resumed amiodarone for signs of recurrent low output failure.   Acute on chronic hypoxemic respiratory failure due to cardiogenic pulmonary edema.  02 saturation today 97% on 3 L/min per .  Patient may need inotropic home infusion at the time of his discharge.   Acute kidney injury superimposed on chronic kidney disease (HCC) -CKD stage  3a at baseline Hyponatremia. Hypokalemia.  Volume status is improving. Renal function with serum cr at 2,77 with K at 3,4 and serum bicarbonate at 24. Na 132. Mg 2.2   Plan to continue diuresis (furosemide and  metolazone) and K supplementation.  Follow up renal function in am.   Atrial flutter (HCC) 04/29. Sp direct current cardioversion, returning to sinus rhythm.   Now on amiodarone drip to prevent RVR while on milrinone.  Ectopy has improved,  Anticoagulation with apixaban.   Epistaxis, has improved. Required packing per ENT.    GERD (gastroesophageal reflux disease) -Continue protonix 40 mg daily.  Type 2 diabetes mellitus with hyperlipidemia (HCC) -Uncontrolled T2DM with hyperglycemia.   Continue sliding scale insulin and  basal long-acting regimen Pre meal insulin 6 units.   Continue with statin therapy.   COPD (chronic obstructive pulmonary disease) (HCC) -No clinical signs of exacerbation. -Continue current medical management.   DNR (do not resuscitate) -Verified pt is a DNR. Pt is emphatic that he is a DNR/DNI. -[name] at bedside also confirming code Status.        Subjective: Patient with no chest pain, dyspnea and edema continue to improve.   Physical Exam: Vitals:   03/01/23 0505 03/01/23 0622 03/01/23 0732 03/01/23 1124  BP: (!) 132/57 124/66 (!) 120/50 (!) 131/58  Pulse: 72  70 72  Resp: 18  18 18   Temp: 98.3 F (36.8 C)  97.7 F (36.5 C) (!) 97.4 F (36.3 C)  TempSrc: Oral  Oral Oral  SpO2: 95%  94% 97%  Weight:      Height:       Neurology awake and alert ENT with mild pallor Cardiovascular with S1 and S2 present and rhythmic with no gallops, rubs or murmurs No JVD Trace lower extremity edema (ted hose in place) Respiratory with no rales or wheezing, no rhonchi Abdomen with no distention  Data Reviewed:    Family Communication: no family at the bedside   Disposition: Status is: Inpatient Remains inpatient appropriate because: heart failure with low cardiac output   Planned Discharge Destination: Home     Author: Coralie Keens, MD 03/01/2023 1:06 PM  For on call review [email or web address].

## 2023-03-02 DIAGNOSIS — J439 Emphysema, unspecified: Secondary | ICD-10-CM | POA: Diagnosis not present

## 2023-03-02 DIAGNOSIS — I5023 Acute on chronic systolic (congestive) heart failure: Secondary | ICD-10-CM | POA: Diagnosis not present

## 2023-03-02 DIAGNOSIS — N179 Acute kidney failure, unspecified: Secondary | ICD-10-CM | POA: Diagnosis not present

## 2023-03-02 DIAGNOSIS — I4892 Unspecified atrial flutter: Secondary | ICD-10-CM | POA: Diagnosis not present

## 2023-03-02 LAB — COOXEMETRY PANEL
Carboxyhemoglobin: 1.5 % (ref 0.5–1.5)
Methemoglobin: <0.7 % (ref 0.0–1.5)
O2 Saturation: 51.7 %
Total hemoglobin: 10.5 g/dL — ABNORMAL LOW (ref 12.0–16.0)

## 2023-03-02 LAB — GLUCOSE, CAPILLARY
Glucose-Capillary: 172 mg/dL — ABNORMAL HIGH (ref 70–99)
Glucose-Capillary: 150 mg/dL — ABNORMAL HIGH (ref 70–99)
Glucose-Capillary: 178 mg/dL — ABNORMAL HIGH (ref 70–99)
Glucose-Capillary: 163 mg/dL — ABNORMAL HIGH (ref 70–99)

## 2023-03-02 LAB — BASIC METABOLIC PANEL
Anion gap: 12 (ref 5–15)
BUN: 81 mg/dL — ABNORMAL HIGH (ref 8–23)
CO2: 27 mmol/L (ref 22–32)
Calcium: 9 mg/dL (ref 8.9–10.3)
Chloride: 93 mmol/L — ABNORMAL LOW (ref 98–111)
Creatinine, Ser: 2.91 mg/dL — ABNORMAL HIGH (ref 0.61–1.24)
GFR, Estimated: 22 mL/min — ABNORMAL LOW (ref >60)
Glucose, Bld: 194 mg/dL — ABNORMAL HIGH (ref 70–99)
Potassium: 3.6 mmol/L (ref 3.5–5.1)
Sodium: 132 mmol/L — ABNORMAL LOW (ref 135–145)

## 2023-03-02 LAB — MAGNESIUM: Magnesium: 2.2 mg/dL (ref 1.7–2.4)

## 2023-03-02 MED ORDER — TORSEMIDE 20 MG PO TABS
20.0000 mg | ORAL_TABLET | Freq: Every day | ORAL | Status: DC
  Administered 2023-03-02: 20 mg via ORAL
  Filled 2023-03-02: qty 1

## 2023-03-02 NOTE — Progress Notes (Signed)
Physical Therapy Re-Evaluation Patient Details Name: [name] MRN: [id number] DOB: 25-Oct-1946 Today's Date: 03/02/2023  History of Present Illness  76 yo male admitted 4/16 with SOB, edema and CHF exacerbation. 4/20 Aflutter with RVR and hypotension with tranfer to ICU. PMHx: CHF, T2DM, CKD, HTN, resp fail on home O2, COPD, HLD   Clinical Impression  [name] is 77 y.o. male admitted with above HPI and diagnosis. Patient is currently limited by functional impairments below (see PT problem list). Patient lives with his wife and is independent at baseline. Patient evaluated on 4/22 with no acute PT needs mobilizing at mod I level. Pt reports he feels a decline in his strength and endurance. Pt not to be unsteady with no AD or IV pole only. Pt's balance with gait improved with RW and shoes. Pt required cues for positioning of RW and min guard progressing to supervision with gait. Patient will benefit from continued skilled PT interventions to address impairments and progress independence with mobility. Acute PT will follow and progress as able.        Recommendations for follow up therapy are one component of a multi-disciplinary discharge planning process, led by the attending physician.  Recommendations may be updated based on patient status, additional functional criteria and insurance authorization.  Follow Up Recommendations       Assistance Recommended at Discharge PRN  Patient can return home with the following  Help with stairs or ramp for entrance;Assist for transportation;Assistance with cooking/housework    Equipment Recommendations None recommended by PT  Recommendations for Other Services       Functional Status Assessment Patient has had a recent decline in their functional status and demonstrates the ability to make significant improvements in function in a reasonable and predictable amount of time.     Precautions / Restrictions Precautions Precautions: Fall;Other  (comment) Precaution Comments: watch sats Restrictions Weight Bearing Restrictions: No      Mobility  Bed Mobility Overal bed mobility: Modified Independent             General bed mobility comments: seated EOB at start of session    Transfers Overall transfer level: Needs assistance Equipment used: None, Rolling walker (2 wheels) Transfers: Sit to/from Stand Sit to Stand: Supervision           General transfer comment: supervision for safety    Ambulation/Gait Ambulation/Gait assistance: Min guard, Supervision Gait Distance (Feet): 450 Feet Assistive device: IV Pole, Rolling walker (2 wheels) Gait Pattern/deviations: Step-through pattern, Decreased stride length, Wide base of support, Trunk flexed Gait velocity: decr     General Gait Details: close guard for first 50' gait with no AD and IV pole. Pt with slightly flexed stance and Rt/Lt sway. balance improved with RW and shoes, pt progressed to supervision from min guard.  Stairs            Wheelchair Mobility    Modified Rankin (Stroke Patients Only)       Balance                                             Pertinent Vitals/Pain Pain Assessment Pain Assessment: No/denies pain    Home Living Family/patient expects to be discharged to:: Private residence Living Arrangements: Spouse/significant other Available Help at Discharge: Family;Available 24 hours/day Type of Home: House Home Access: Level entry  Home Layout: One level Home Equipment: Cane - single point      Prior Function Prior Level of Function : Independent/Modified Independent                     Hand Dominance   Dominant Hand: Right    Extremity/Trunk Assessment   Upper Extremity Assessment Upper Extremity Assessment: Overall WFL for tasks assessed    Lower Extremity Assessment Lower Extremity Assessment: LLE deficits/detail;RLE deficits/detail (5xSit<>Stand: 17 seconds with UE use;  pt unable to stand without UE's) RLE Deficits / Details: 4+/5 throughout hip flex, abd,add; knee ext/flex; DF/PF RLE Sensation: WNL RLE Coordination: WNL LLE Deficits / Details: 4+/5 throughout hip flex, abd,add; knee ext/flex; 4/5 for DF/PF LLE Sensation: WNL LLE Coordination: WNL    Cervical / Trunk Assessment Cervical / Trunk Assessment: Normal  Communication   Communication: No difficulties  Cognition Arousal/Alertness: Awake/alert Behavior During Therapy: WFL for tasks assessed/performed Overall Cognitive Status: Within Functional Limits for tasks assessed                                          General Comments      Exercises     Assessment/Plan    PT Assessment Patient needs continued PT services  PT Problem List Decreased strength;Decreased activity tolerance;Decreased balance;Decreased mobility;Decreased knowledge of use of DME       PT Treatment Interventions DME instruction;Gait training;Stair training;Functional mobility training;Therapeutic activities;Therapeutic exercise;Balance training;Neuromuscular re-education;Cognitive remediation;Patient/family education    PT Goals (Current goals can be found in the Care Plan section)  Acute Rehab PT Goals Patient Stated Goal: get strength back and keep mobilizing PT Goal Formulation: With patient Time For Goal Achievement: 03/16/23 Potential to Achieve Goals: Good    Frequency Min 3X/week     Co-evaluation               AM-PAC PT "6 Clicks" Mobility  Outcome Measure Help needed turning from your back to your side while in a flat bed without using bedrails?: None Help needed moving from lying on your back to sitting on the side of a flat bed without using bedrails?: None Help needed moving to and from a bed to a chair (including a wheelchair)?: A Little Help needed standing up from a chair using your arms (e.g., wheelchair or bedside chair)?: A Little Help needed to walk in hospital  room?: A Little Help needed climbing 3-5 steps with a railing? : A Little 6 Click Score: 20    End of Session Equipment Utilized During Treatment: Gait belt;Oxygen Activity Tolerance: Patient tolerated treatment well Patient left: in chair;with call bell/phone within reach;with chair alarm set Nurse Communication: Mobility status PT Visit Diagnosis: Other abnormalities of gait and mobility (R26.89)    Time: 1610-9604 PT Time Calculation (min) (ACUTE ONLY): 30 min   Charges:   PT Evaluation $PT Re-evaluation: 1 Re-eval PT Treatments $Gait Training: 8-22 mins        Wynn Maudlin, DPT Acute Rehabilitation Services Office [phone]  03/02/23 11:47 AM

## 2023-03-02 NOTE — Progress Notes (Signed)
Mobility Specialist Progress Note:   03/02/23 1115  Mobility  Activity Ambulated with assistance in hallway  Level of Assistance Standby assist, set-up cues, supervision of patient - no hands on  Assistive Device Front wheel walker  Distance Ambulated (ft) 500 ft  Activity Response Tolerated well  Mobility Referral Yes  $Mobility charge 1 Mobility   Pt eager for mobility session. No physical assistance required. Pt c/o feeling "weak" the last few days. VSS on 3LO2. Pt left sitting EOB with all needs met.   Addison Lank Mobility Specialist Please contact via SecureChat or  Rehab office at [phone]

## 2023-03-02 NOTE — Progress Notes (Signed)
Patient ID: [name], male   DOB: 21-Sep-1946, 77 y.o.   MRN: [id number]     Advanced Heart Failure Rounding Note  PCP-Cardiologist: Kristeen Miss, MD   Subjective:    - 4/20 while on dobutamine patient went into atrial flutter with RVR, became hypotensive.  Decision made to start amiodarone and transferred to CVICU. - 4/22 CO-OX marginal. Milrinone increased to 0.25 mcg. Diuresed with IV lasix. - 4/24 RHC with elevated filling pressures, transitioned to Lasix gtt at 12 mg per hour.  - 4/25 Epistaxis requiring packing by ENT. Went back into AFL  - 4/29 Nasal Packing removed; TEE/DCCV => NSR, EF 25%, RV mod HK. No clot. Mod AI  - 5/2 Switched back to lasix drip and given metolazone.  - 5/3 Milrinone 0.125 restarted.   Good diuresis yesterday, weight down 2 lbs.  Currently on lasix drip at 20 mg per hour. He is on milrinone 0.125, co-ox pending today. CVP lower at 10.   Creatinine 2.36>2.62>2.6>2.77>2.91  He remains in NSR on amiodarone gtt 30 mg/hr.   Feels ok this morning. Breathing stable. Denies CP/SOB. Feels weak from being in the hospital for over 2 weeks now.   Objective:   Weight Range: 76.3 kg Body mass index is 24.13 kg/m.   Vital Signs:   Temp:  [97.3 F (36.3 C)-98.4 F (36.9 C)] 98.4 F (36.9 C) (05/06 0355) Pulse Rate:  [56-77] 58 (05/06 0355) Resp:  [18-20] 20 (05/06 0355) BP: (99-131)/(41-58) 113/41 (05/06 0355) SpO2:  [94 %-100 %] 100 % (05/06 0355) Weight:  [76.3 kg] 76.3 kg (05/06 0355) Last BM Date : 02/27/23  Weight change: Filed Weights   02/28/23 0335 03/01/23 0500 03/02/23 0355  Weight: 78.4 kg 77.2 kg 76.3 kg    Intake/Output:   Intake/Output Summary (Last 24 hours) at 03/02/2023 0743 Last data filed at 03/02/2023 0407 Gross per 24 hour  Intake 651 ml  Output 2925 ml  Net -2274 ml   CVP 10 Physical Exam  General:  chronically ill appearing.  No respiratory difficulty HEENT: normal Neck: supple. JVD ~10 cm. Carotids 2+ bilat; no  bruits. No lymphadenopathy or thyromegaly appreciated. Cor: PMI nondisplaced. Regular rate & rhythm. No rubs, gallops or murmurs. Lungs: clear Abdomen: soft, nontender, nondistended. No hepatosplenomegaly. No bruits or masses. Good bowel sounds. Extremities: no cyanosis, clubbing, rash, +1 BLE edema. + Ted hose. PICC RUE Neuro: alert & oriented x 3, cranial nerves grossly intact. moves all 4 extremities w/o difficulty. Affect pleasant.   Telemetry   NSR 60s (personally reviewed)  EKG    No new EKG to review  Labs    CBC No results for input(s): "WBC", "NEUTROABS", "HGB", "HCT", "MCV", "PLT" in the last 72 hours.   Basic Metabolic Panel Recent Labs    40/98/11 0514 03/02/23 0641  NA 132* 132*  K 3.4* 3.6  CL 92* 93*  CO2 24 27  GLUCOSE 140* 194*  BUN 80* 81*  CREATININE 2.77* 2.91*  CALCIUM 9.1 9.0  MG 2.2 2.2   BNP (last 3 results) Recent Labs    01/20/23 1357 02/02/23 1130 02/10/23 1302  BNP 1,939.2* 2,275.8* 2,508.7*   Imaging   No results found.  Medications:     Scheduled Medications:  apixaban  5 mg Oral BID   Chlorhexidine Gluconate Cloth  6 each Topical Daily   diclofenac Sodium  2 g Topical QID   fluticasone furoate-vilanterol  1 puff Inhalation Daily   hydrALAZINE  37.5 mg Oral Q8H  insulin aspart  0-5 Units Subcutaneous QHS   insulin aspart  0-9 Units Subcutaneous TID WC   insulin aspart  6 Units Subcutaneous TID WC   insulin glargine-yfgn  35 Units Subcutaneous Daily   isosorbide mononitrate  30 mg Oral Daily   linaclotide  72 mcg Oral q morning   pantoprazole  40 mg Oral Daily   potassium chloride  20 mEq Oral Daily   rosuvastatin  20 mg Oral QHS   sodium chloride flush  10-40 mL Intracatheter Q12H   sodium chloride flush  3 mL Intravenous Q12H   umeclidinium bromide  1 puff Inhalation Daily    Infusions:  amiodarone 30 mg/hr (03/01/23 2316)   furosemide (LASIX) 200 mg in dextrose 5 % 100 mL (2 mg/mL) infusion 20 mg/hr (03/01/23  2121)   milrinone 0.125 mcg/kg/min (03/02/23 0145)     PRN Medications: acetaminophen, ALPRAZolam, ipratropium-albuterol, mouth rinse, phenol, saline, sodium chloride, sodium chloride flush, traZODone   Patient Profile   Mr. Hatten is a 77 y.o. male with CAD c/p CABG 2003, HFrEF, chronic DOE, Rt CEA >58yrs ago, Lt CEA 14', HTN, DM2, HLD, PAD w/ Rt SFA and Rt politeal artery vascular stents 15', prostate cancer. Admitted with a/c HFrEF.  Assessment/Plan  1. Acute on chronic systolic CHF: Ischemic cardiomyopathy. Echo in 3/24 with EF 35-40%, RV normal. Echo this admission with EF 30%, moderately decreased RV dysfunction with mild RVE, PASP 63 mmHg, IVC dilated. Low output HF this admission, has required inotrope. RHC today showed ongoing volume overload with stable cardiac output on milrinone 0.25. Prominent v-waves but only mild MR on echo (likely due to volume overload/diastolic dysfunction). GDMT limited by hypotension/AKI. Milrinone stopped 02/21/23. CO-OX trended down 48.5%. Milrinone 0.125 restarted on 5/3.  He diuresed well yesterday on Lasix gtt 20 mg/hr but creatinine higher at 2.77>2.91.  Co-ox pending today.  CVP 10 today.  - Stop lasix gtt today, transition to Torsemide 20 mg daily - GDMT limited by renal fx  - Continue hydralazine 37.5 mg tid and Imdur 30 daily.   - Long-term prognosis worrisome, not candidate for LVAD with renal dysfunction. Not tolerating milrinone wean.  He may need palliative home milrinone.  2.  Atrial flutter: Rate controlled.  Amiodarone IV stopped 4/27 with prolonged QTc and po started.  QTc was shorter on telemetry. S/p TEE/DCCV 4/29. Maintaining NSR.  - Continue IV amiodarone while on milrinone.  - Continue Eliquis.  3. CAD: Status post CABG 2003.  No ACS this admission. No chest pain.  - Continue Crestor.  - No coronary angiography with no ACS and AKI.  4.Carotid Disease: S/p b/l CEA - continue statin 5. PAD: s/p intervention 2015 Rt SFA and Rt politeal  artery vascular stents - continue statin  6. Type 2 DM: Per [facility].   - On SSI  7. COPD/emphysema: Uses 2 L home O2 as needed, requiring oxygen here.  8. AKI on CKD stage 4: Baseline creatinine ~ 2.2.  Creatinine trended up off milrinone, now restarted with stabilization. 2.36>2.66>2.6>2.8>2.91. - would continue milrinone to allow for renal recovery 9. Epistaxis: 4/24 Transexamic used + Afrin.  Nasal packing 4/25, removed 4/29. No further visible bleeding. Hgb stable.  - monitor closely w/ Eliquis restart - appreciate ENT  10: GOC: DNR. If he is unable to come off milrinone will need to involve Palliative Care for GOC. May need palliative home milrinone.   Alen Bleacher, NP  7:43 AM

## 2023-03-02 NOTE — Progress Notes (Signed)
Progress Note   Patient: [name] ZOX:[id number] DOB: [birth year] DOA: 02/10/2023     19 DOS: the patient was seen and examined on 03/02/2023   Brief hospital course: [name] was admitted to the hospital with the working diagnosis of heart failure exacerbation.   77 yo male with the past medical history of heart failure, T2DM, CKD, COPD chronic hypoxemic respiratory failure and hypertension who presented with lower extremity edema. Recent hospitalization 03/26 to 01/24/23 for heart failure exacerbation. He was discharged with 20 mg furosemide. At home he developed worsening lower extremity edema. Cardiology outpatient follow up he had gain 5 lbs from the time of his discharge. At home he continue to have worsening edema, and weight gain for 9 lbs more. On his initial physical examination his blood pressure was 129/82, HR 86, RR 20 and 02 saturation 92%, lungs with bilateral rales, more left than right, positive JVD, heart with S1 and S2 present and rhythmic, abdomen with no distention, positive lower extremity edema pitting.   Na 135, K 4,0 Cl 102 bicarbonate 24 glucose 169, bun 49 cr 2,32  BNP 2,508  Wbc 5,9 hgb 11,8 plt 184   Chest radiograph with cardiomegaly, bilateral hilar vascular congestion, with bilateral interstitial infiltrates, small bilateral pleural effusions, and fluid in the right fissure.  Sternotomy wires in place.   EKG 82 bpm, normal axis, right bundle branch block, sinus rhythm with 1st degree AV block, with no significant ST segment or  T wave changes.   Patient has been placed on furosemide for diuresis.   04/19 volume status is improving but not yet back to baseline.  Patient placed on dobutamine infusion.  04/20 patient developed VT and was transferred to the intensive care unit 2H per cardiology recommendations.   04/21: CVP 14, still expressing dyspnea on exertion and demonstrating signs of fluid overload.  Rate controlled with amiodarone drip.  Following  cardiology/heart failure service recommendation milrinone has been started.  Continue IV diuresis. 04/22 patient on milrinone infusion.  04/23 patient with improvement in volume status but not back to baseline. Plan for right heart catheterization.  4/24: RHC with elevated filling pressures, wedge of 24, cardiac output 4.7 on milrinone -4/24: Recurrent epistaxis, treated with Afrin and tranexamic acid, Eliquis held -4/26: Persistent epistaxis ENT consulted anterior/posterior nasal packing performed by Dr. Suszanne Conners -4/27, amiodarone changed to p.o. -4/29, TEE/DCCV> NSR  05/03 recurrent volume overload and signs of low output failure. Placed back on amiodarone infusion.  05/04 volume status improving with inotropic support.  05/05 symptoms continue to improve with inotropic support.  05/06 continue on milrinone, transitioned back to oral loop diuretic therapy with torsemide.   Assessment and Plan: * Acute on chronic systolic CHF (congestive heart failure) (HCC) -Echocardiogram with reduced LV systolic function with EF 35 to 40%, no LVH, RV systolic function preserved, RVSP 45,5 mid dilatation of LA, no pericardial effusion, mild to moderate TR, mild aortic stenosis.  Low output heart failure.   Urine output is 2,925 ml  Systolic blood pressure is 113 to 112 mmHg.   Today transitioned back to torsemide 20 mg daily.  05/03, 05/05  metolazone 2,5 mg  Milrinone 0.125 mcg/ kg/ min    Continue after load reduction with hydralazine and isosorbide.  Resumed amiodarone for signs of recurrent low output failure.   Acute on chronic hypoxemic respiratory failure due to cardiogenic pulmonary edema.  02 saturation today 97% on 3 L/min per [place].  Patient may need inotropic home infusion at  the time of his discharge.   Acute kidney injury superimposed on chronic kidney disease (HCC) -CKD stage 3a at baseline Hyponatremia. Hypokalemia.  Renal function today with serum cr at 2,91 with K at 3,6 and  serum bicarbonate at 27, Na is 132, Mg 2.2   Plan to continue diuresis (torsemide and metolazone) and K supplementation.  Follow up renal function in am.   Atrial flutter (HCC) 04/29. Sp direct current cardioversion, returning to sinus rhythm.   Now on amiodarone drip to prevent RVR while on milrinone.  Ectopy has improved,  Anticoagulation with apixaban.   Epistaxis, has improved. Required packing per ENT.    GERD (gastroesophageal reflux disease) -Continue protonix 40 mg daily.  Type 2 diabetes mellitus with hyperlipidemia (HCC) -Uncontrolled T2DM with hyperglycemia.   Continue sliding scale insulin and  basal long-acting regimen Pre meal insulin 6 units.   Continue with statin therapy.   COPD (chronic obstructive pulmonary disease) (HCC) -No clinical signs of exacerbation. -Continue current medical management.   DNR (do not resuscitate) -Verified pt is a DNR. Pt is emphatic that he is a DNR/DNI. -Daughter at bedside also confirming code Status.        Subjective: Patient with improvement in dyspnea and edema, no chest pain.   Physical Exam: Vitals:   03/02/23 0100 03/02/23 0355 03/02/23 0805 03/02/23 0852  BP: (!) 115/44 (!) 113/41 (!) 112/49   Pulse: (!) 56 (!) 58 (!) 56   Resp: 20 20    Temp: (!) 97.3 F (36.3 C) 98.4 F (36.9 C)    TempSrc: Oral Oral    SpO2: 98% 100% 95% 94%  Weight:  76.3 kg    Height:       Neurology awake and alert ENT with mild pallor Cardiovascular with S1 and S2 present and rhythmic with no gallops, rubs or murmurs No JVD No lower extremity edema Respiratory with no rales or wheezing Abdomen with no distention  Data Reviewed:    Family Communication: no family a the bedside   Disposition: Status is: Inpatient Remains inpatient appropriate because: heart failure on IV inotropic support.   Planned Discharge Destination: Home    Author: Coralie Keens, MD 03/02/2023 10:00 AM  For on call review  [email or web address].

## 2023-03-03 ENCOUNTER — Inpatient Hospital Stay (HOSPITAL_COMMUNITY): Payer: Medicare HMO

## 2023-03-03 ENCOUNTER — Other Ambulatory Visit (HOSPITAL_COMMUNITY): Payer: Self-pay

## 2023-03-03 DIAGNOSIS — N179 Acute kidney failure, unspecified: Secondary | ICD-10-CM | POA: Diagnosis not present

## 2023-03-03 DIAGNOSIS — I5023 Acute on chronic systolic (congestive) heart failure: Secondary | ICD-10-CM | POA: Diagnosis not present

## 2023-03-03 DIAGNOSIS — I4892 Unspecified atrial flutter: Secondary | ICD-10-CM | POA: Diagnosis not present

## 2023-03-03 DIAGNOSIS — Z515 Encounter for palliative care: Secondary | ICD-10-CM

## 2023-03-03 DIAGNOSIS — Z7189 Other specified counseling: Secondary | ICD-10-CM

## 2023-03-03 DIAGNOSIS — J439 Emphysema, unspecified: Secondary | ICD-10-CM | POA: Diagnosis not present

## 2023-03-03 HISTORY — PX: IR US GUIDE VASC ACCESS RIGHT: IMG2390

## 2023-03-03 HISTORY — PX: IR FLUORO GUIDE CV LINE RIGHT: IMG2283

## 2023-03-03 LAB — BASIC METABOLIC PANEL
Anion gap: 13 (ref 5–15)
BUN: 82 mg/dL — ABNORMAL HIGH (ref 8–23)
CO2: 25 mmol/L (ref 22–32)
Calcium: 8.6 mg/dL — ABNORMAL LOW (ref 8.9–10.3)
Chloride: 93 mmol/L — ABNORMAL LOW (ref 98–111)
Creatinine, Ser: 3.02 mg/dL — ABNORMAL HIGH (ref 0.61–1.24)
GFR, Estimated: 21 mL/min — ABNORMAL LOW (ref >60)
Glucose, Bld: 152 mg/dL — ABNORMAL HIGH (ref 70–99)
Potassium: 2.9 mmol/L — ABNORMAL LOW (ref 3.5–5.1)
Sodium: 131 mmol/L — ABNORMAL LOW (ref 135–145)

## 2023-03-03 LAB — GLUCOSE, CAPILLARY
Glucose-Capillary: 132 mg/dL — ABNORMAL HIGH (ref 70–99)
Glucose-Capillary: 180 mg/dL — ABNORMAL HIGH (ref 70–99)
Glucose-Capillary: 110 mg/dL — ABNORMAL HIGH (ref 70–99)
Glucose-Capillary: 136 mg/dL — ABNORMAL HIGH (ref 70–99)

## 2023-03-03 LAB — MAGNESIUM: Magnesium: 2.3 mg/dL (ref 1.7–2.4)

## 2023-03-03 LAB — COOXEMETRY PANEL
Carboxyhemoglobin: 1.3 % (ref 0.5–1.5)
Methemoglobin: <0.7 % (ref 0.0–1.5)
O2 Saturation: 56.9 %
Total hemoglobin: 10.6 g/dL — ABNORMAL LOW (ref 12.0–16.0)

## 2023-03-03 MED ORDER — HYDRALAZINE HCL 25 MG PO TABS
25.0000 mg | ORAL_TABLET | Freq: Three times a day (TID) | ORAL | Status: DC
  Administered 2023-03-03 – 2023-03-04 (×4): 25 mg via ORAL
  Filled 2023-03-03 (×4): qty 1

## 2023-03-03 MED ORDER — SODIUM CHLORIDE 0.9 % IV SOLN: INTRAVENOUS | Status: DC | PRN

## 2023-03-03 MED ORDER — LIDOCAINE-EPINEPHRINE 1 %-1:100000 IJ SOLN
INTRAMUSCULAR | Status: AC
  Filled 2023-03-03: qty 1

## 2023-03-03 MED ORDER — POTASSIUM CHLORIDE 20 MEQ PO PACK
40.0000 meq | PACK | Freq: Once | ORAL | Status: AC
  Administered 2023-03-03: 40 meq via ORAL
  Filled 2023-03-03: qty 2

## 2023-03-03 MED ORDER — AMIODARONE HCL 200 MG PO TABS
ORAL_TABLET | ORAL | 0 refills | Status: DC
  Filled 2023-03-03: qty 74, 30d supply, fill #0

## 2023-03-03 MED ORDER — MILRINONE LACTATE IN DEXTROSE 20-5 MG/100ML-% IV SOLN: 0.1250 ug/kg/min | INTRAVENOUS | Status: DC

## 2023-03-03 MED ORDER — TORSEMIDE 20 MG PO TABS
20.0000 mg | ORAL_TABLET | Freq: Every day | ORAL | 0 refills | Status: DC
  Filled 2023-03-03: qty 30, 30d supply, fill #0

## 2023-03-03 MED ORDER — AMIODARONE HCL 200 MG PO TABS
200.0000 mg | ORAL_TABLET | Freq: Two times a day (BID) | ORAL | Status: DC
  Administered 2023-03-03: 200 mg via ORAL
  Filled 2023-03-03: qty 1

## 2023-03-03 MED ORDER — LIDOCAINE-EPINEPHRINE 1 %-1:100000 IJ SOLN: 20.0000 mL | Freq: Once | INTRAMUSCULAR | 0 refills | Status: AC

## 2023-03-03 MED ORDER — DICLOFENAC SODIUM 1 % EX GEL: 2.0000 g | Freq: Four times a day (QID) | CUTANEOUS | Status: DC | PRN

## 2023-03-03 MED ORDER — POTASSIUM CHLORIDE CRYS ER 20 MEQ PO TBCR
20.0000 meq | EXTENDED_RELEASE_TABLET | Freq: Every day | ORAL | 0 refills | Status: DC
  Filled 2023-03-03: qty 30, 30d supply, fill #0

## 2023-03-03 MED ORDER — AMIODARONE HCL 200 MG PO TABS
400.0000 mg | ORAL_TABLET | Freq: Two times a day (BID) | ORAL | Status: DC
  Administered 2023-03-03 – 2023-03-04 (×2): 400 mg via ORAL
  Filled 2023-03-03 (×2): qty 2

## 2023-03-03 MED ORDER — LIDOCAINE-EPINEPHRINE 1 %-1:100000 IJ SOLN
20.0000 mL | Freq: Once | INTRAMUSCULAR | Status: AC
  Administered 2023-03-03: 4 mL via INTRADERMAL

## 2023-03-03 MED ORDER — LIDOCAINE-EPINEPHRINE 2 %-1:100000 IJ SOLN
20.0000 mL | Freq: Once | INTRAMUSCULAR | Status: DC
  Filled 2023-03-03: qty 20

## 2023-03-03 NOTE — TOC Progression Note (Addendum)
Transition of Care [facility]) - Progression Note    Patient Details  Name: [name] MRN: [id number] Date of Birth: 07/20/1946  [facility] CM/SW Contact  Elliot Cousin, RN Phone Number: [phone] 03/03/2023, 10:50 AM  Clinical Narrative:  CM spoke to pt at bedside and offered choice. Agreeable to to [facility] for Home Hospice. States to discuss with dtr. Referral sent to [facility] for Home Hospice with Milrinone.     Expected Discharge Plan: Home w Home Health Services Barriers to Discharge: Continued Medical Work up  Expected Discharge Plan and Services   Discharge Planning Services: CM Consult Post Acute Care Choice: Home Health Living arrangements for the past 2 months: Single Family Home                           HH Arranged: Refused HH           Social Determinants of Health (SDOH) Interventions SDOH Screenings   Food Insecurity: No Food Insecurity (02/24/2023)  Housing: Low Risk  (02/24/2023)  Transportation Needs: No Transportation Needs (02/24/2023)  Utilities: Not At Risk (02/24/2023)  Financial Resource Strain: Low Risk  (02/24/2023)  Tobacco Use: Medium Risk (02/26/2023)    Readmission Risk Interventions    01/21/2023    4:21 PM  Readmission Risk Prevention Plan  Transportation Screening Complete  Medication Review (RN Care Manager) Complete  HRI or Home Care Consult Complete  Palliative Care Screening Not Applicable  Skilled Nursing Facility Not Applicable

## 2023-03-03 NOTE — Procedures (Signed)
Interventional Radiology Procedure Note  Procedure: Right IJV Tunneled PICC placement  Indication: Ischemic cardiomyopathy   Findings: Please refer to procedural dictation for full description.  Complications: None  EBL: < 10 mL  Acquanetta Belling, MD [phone]

## 2023-03-03 NOTE — Consult Note (Addendum)
Palliative Medicine Inpatient Follow Up Note HPI: 77 yo male admitted 4/16 with SOB, edema and CHF exacerbation. 4/20 Aflutter with RVR and hypotension with tranfer to ICU. PMHx: CHF, T2DM, CKD, HTN, resp fail on home O2, COPD, HLD.   [name] has been hospitalized for the past twenty days. Palliative care has been asked to get involved for further goals of care conversations.   Clinical Assessment/Goals of Care:  *Please note that this is a verbal dictation therefore any spelling or grammatical errors are due to the "Dragon Medical One" system interpretation.  I have reviewed medical records including EPIC notes, labs and imaging, received report from bedside RN, assessed the patient who is sitting at the edge of the bed completing his breakfast.    I met with [name] to further discuss diagnosis prognosis, GOC, EOL wishes, disposition and options.   I introduced Palliative Medicine as specialized medical care for people living with serious illness. It focuses on providing relief from the symptoms and stress of a serious illness. The goal is to improve quality of life for both the patient and the family.  Medical History Review and Understanding:  A review of [name]'s past medical history inclusive of his heart failure, type 2 diabetes, hypertension, hyperlipidemia, and chronic kidney disease was held.  Social History:  I reviewed with [name] that he is originally from [facility].  He has been married to his wife for the past 57 years they were high school sweetheart's and him so far had a beautiful marriage.  Maliik has 1 daughter who lives in the [place] area and 1 son who lives in [place].  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  Naden shares that he does have a strong faith and practices within the [facility]  denomination.  Functional and Nutritional State:  Preceding [name]'s 21-day hospitalization he was fully independent of all B ADLs and IADLs.  He did have a fairly good appetite.  Palliative Symptoms:  Dayvian experiences profound shortness of breath with mobility.  Advance Directives:  A detailed discussion was had today regarding advanced directives.  [name] shares that he would rely upon his daughter [name] to help make decisions for him if unable to do so for himself.  Code Status:  Concepts specific to code status, artifical feeding and hydration, continued IV antibiotics and rehospitalization was had.  The difference between a aggressive medical intervention path  and a palliative comfort care path for this patient at this time was had.   [name] is established DO NOT RESUSCITATE DO NOT INTUBATE CODE STATUS.  Discussion:  Whalen and I reviewed his prolonged hospitalization and the difficulties endured as a result of having been here.  He shares with me that he does not know if he will be getting home from this hospital stay.  I asked him what is making him feel that way and he endorsed understanding that his disease process most notably his heart failure is exceptionally severe.  I was able to affirm that and share that it appears he will need to be on milrinone at the time of discharge.  We reviewed that this is not a long-term solution and at some point in time there should be a plan to wean him off milrinone.  I did share that often when that occurs patient's timeframe in terms of life and  living can be shortened.  We reviewed that it is worthwhile to start discussions related to hospice care.  I described hospice as a service for patients who have a life expectancy of 6 months or less.The goal of hospice is the preservation of dignity and quality at the end phases of life. Under hospice care, the focus changes from curative to symptom relief.   [name] asked me quite assertively are we  at a point where hospice should be considered.  I was open and honest with him sharing that at this time yes hospice is a reasonable consideration if it aligns with his goals of care. I shared that again, milrinone is not a long term solution for him and if he has some goals to accomplish prior to the end of his life then I think the support of milrinone is excellent though if he feels he has not quality in his life any longer and does not have anything he feels in his life left to accomplish then proceeding with hospice is very reasonable.   We discussed having further conversations with his family to better identify where to go moving forward.  [name] he was previously the primary caregiver for his wife which will need to change upon discharge.  He shares his daughter is building an in-law suite for them though this will take time to complete.   Discussed the importance of continued conversation with family and their  medical providers regarding overall plan of care and treatment options, ensuring decisions are within the context of the patients values and GOCs. ________________________ Addendum:  I spoke to patient's daughter [name] over the phone.  We discussed [name]'s present clinical condition.  She inquired as to why that was not considered and I shared that this is something the advanced heart failure team would need to discuss with her.  We reviewed the options at this time which are limited and [name] is very much aware of that.  We discussed the importance of conversations to further ascertain goals.  [name] shares that her brother is flying down from [place] on Thursday and asks if we may have a formal meeting then around 4:15-4 30.  I shared that I would not be present though I would verify it team member could help complete this meeting.  Questions and concerns were answered. ___________________________ Addendum #2:  A family meeting was held this afternoon with [name] of [facility],  Barrie Folk, RN, and myself. Family members present were patient [name], his wife [name], daughter, [name], and son [name]. We discussed patients current clinical state. [name] shares that the cardiology team estimated Eilam could live up to two years in his present state. I was very honest in sharing I think that would be quite optimistic at this point in time.   We discussed the options at this time home with [facility] or home with hospice. Patients daughter shares that as a family they are not ready for hospice care.   Patients daughter wants for the patient to transition home sooner than later. He would then have a boost in his spirits at the very least. We reviewed if his symptoms should worsen or his condition deteriorate then hospice is always an option.   Additional time: 41  Decision Maker: Gullikson,[name] Daughter   [phone]   SUMMARY OF RECOMMENDATIONS   DNAR/DNI  Had open and honest conversations in the setting of patients advanced heart failure and inability to tolerate milrinone wean  Discussed Palliative versus Hospice services  Appreciate the advanced heart failure team updating Tyre and his daughter [name] regarding options moving forward -Per chart review it does not appear he is an LVAD candidate due to his impaired renal function  Plan for family meeting Thursday late afternoon  Appreciate additional nursing support by Barrie Folk, RN given the complexity of Kaine's clinical scenario  Ongoing palliative care support  Code Status/Advance Care Planning: DNAR/DNI   Symptom Management:  Dyspnea: - Experiencing shortness of breath with movement - Slow controlled movements - Can consider low dose dilaudid to further support dyspnea  Palliative Prophylaxis:  Aspiration, Bowel Regimen, Delirium Protocol, Frequent Pain Assessment, Oral Care, Palliative Wound Care, and Turn Reposition  Additional Recommendations (Limitations, Scope, Preferences): Continue present scope of  care  Psycho-social/Spiritual:  Desire for further Chaplaincy support: Patient is [facility] Additional Recommendations: Education on palliative and hospice care   Prognosis: Quite limited hospice would be an appropriate next step if patient and his family determined they would like to pursue this  Discharge Planning: Discharge plan uncertain at this time.  Vitals:   03/03/23 0326 03/03/23 0600  BP: (!) 127/50 115/85  Pulse:    Resp: (!) 22   Temp: 98.2 F (36.8 C)   SpO2: 95%     Intake/Output Summary (Last 24 hours) at 03/03/2023 1610 Last data filed at 03/03/2023 0601 Gross per 24 hour  Intake 1907.6 ml  Output 2225 ml  Net -317.4 ml   Last Weight  Most recent update: 03/03/2023  3:10 AM    Weight  76 kg (167 lb 8.8 oz)            Gen: Elderly Caucasian male in no acute distress HEENT: moist mucous membranes CV: Regular rate and rhythm  PULM: On 2 L nasal cannula breathing is even unlabored ABD: soft/nontender  EXT: No edema Neuro: Alert and oriented x3  PPS: 40%-50%   This conversation/these recommendations were discussed with patient primary care team, Dr. Ella Jubilee  Total Time: 34 Billing based on MDM: High  Problems Addressed: One acute or chronic illness or injury that poses a threat to life or bodily function  Amount and/or Complexity of Data: Category 3:Discussion of management or test interpretation with external physician/other qualified health care professional/appropriate source (not separately reported)  Risks: Decision not to resuscitate or to de-escalate care because of poor prognosis ______________________________________________________ Lamarr Lulas [place] Palliative Medicine Team Team Cell Phone: [phone] Please utilize secure chat with additional questions, if there is no response within 30 minutes please call the above phone number  Palliative Medicine Team providers are available by phone from 7am to 7pm daily and can be reached  through the team cell phone.  Should this patient require assistance outside of these hours, please call the patient's attending physician.

## 2023-03-03 NOTE — Discharge Summary (Signed)
Physician Discharge Summary   Patient: [name] MRN: [id number] DOB: [birth year]  Admit date:     02/10/2023  Discharge date: 03/03/23  Discharge Physician: [name]   PCP: Connor Garbe, MD   Recommendations at discharge:    Patient is being discharged home with palliative milrinone infusion, for end stage heart failure Continue amiodarone 400 mg po bid for 7 days and then continue with 400 mg po daily. Changed furosemide to torsemide.  Follow up renal function as outpatient in 7 days. Follow up with palliative care services and possible transition to home hospice. Follow up with Dr [name] in 7 to 10 days. Follow up with Cardiology as scheduled.   Discharge Diagnoses: Principal Problem:   Acute on chronic systolic CHF (congestive heart failure) (HCC) Active Problems:   Acute kidney injury superimposed on chronic kidney disease (HCC)   Atrial flutter (HCC)   GERD (gastroesophageal reflux disease)   Type 2 diabetes mellitus with hyperlipidemia (HCC)   COPD (chronic obstructive pulmonary disease) (HCC)   DNR (do not resuscitate)  Resolved Problems:   * No resolved hospital problems. [facility] Course: [name] was admitted to the hospital with the working diagnosis of heart failure exacerbation.   77 yo male with the past medical history of heart failure, T2DM, CKD, COPD chronic hypoxemic respiratory failure and hypertension who presented with lower extremity edema. Recent hospitalization 03/26 to 01/24/23 for heart failure exacerbation. He was discharged with 20 mg furosemide. At home he developed worsening lower extremity edema. Cardiology outpatient follow up he had gain 5 lbs from the time of his discharge. At home he continue to have worsening edema, and weight gain for 9 lbs more. On his initial physical examination his blood pressure was 129/82, HR 86, RR 20 and 02 saturation 92%, lungs with bilateral rales, more left than right, positive JVD, heart  with S1 and S2 present and rhythmic, abdomen with no distention, positive lower extremity edema pitting.   Na 135, K 4,0 Cl 102 bicarbonate 24 glucose 169, bun 49 cr 2,32  BNP 2,508  Wbc 5,9 hgb 11,8 plt 184   Chest radiograph with cardiomegaly, bilateral hilar vascular congestion, with bilateral interstitial infiltrates, small bilateral pleural effusions, and fluid in the right fissure.  Sternotomy wires in place.   EKG 82 bpm, normal axis, right bundle branch block, sinus rhythm with 1st degree AV block, with no significant ST segment or  T wave changes.   Patient has been placed on furosemide for diuresis.   04/19 volume status is improving but not yet back to baseline.  Patient placed on dobutamine infusion.  04/20 patient developed VT and was transferred to the intensive care unit 2H per cardiology recommendations.   04/21: CVP 14, still expressing dyspnea on exertion and demonstrating signs of fluid overload.  Rate controlled with amiodarone drip.  Following cardiology/heart failure service recommendation milrinone has been started.  Continue IV diuresis. 04/22 patient on milrinone infusion.  04/23 patient with improvement in volume status but not back to baseline. Plan for right heart catheterization.  4/24: RHC with elevated filling pressures, wedge of 24, cardiac output 4.7 on milrinone -4/24: Recurrent epistaxis, treated with Afrin and tranexamic acid, Eliquis held -4/26: Persistent epistaxis ENT consulted anterior/posterior nasal packing performed by [name] -4/27, amiodarone changed to p.o. -4/29, TEE/DCCV> NSR  05/03 recurrent volume overload and signs of low output failure. Placed back on milrinone infusion.  05/04 volume status improving with inotropic support.  05/05  symptoms continue to improve with inotropic support.  05/06 continue on milrinone, transitioned back to oral loop diuretic therapy with torsemide.  05/07 not able to wean off milrinone infusion, patient  with poor prognosis and now labeled as end stage heart failure.  Plan for home milrinone infusion for palliative treatment. Patient will have home health services and palliative care, transition to hospice if continue to deteriorate on milrinone infusion.  Right IJV tunneled PICC placement today.   Assessment and Plan: * Acute on chronic systolic CHF (congestive heart failure) (HCC) -Echocardiogram with reduced LV systolic function with EF 35 to 40%, no LVH, RV systolic function preserved, RVSP 45,5 mid dilatation of LA, no pericardial effusion, mild to moderate TR, mild aortic stenosis.  Low output heart failure.   Patient had a prolonged hospitalization and received aggressive medical therapy including inotropic support with milrinone and aggressive diuresis with IV furosemide infusion. Intermittent metolazone. Direct current cardioversion and amiodarone drip.   His volume status is negative at  -16,746 ml, with improvement of dyspnea and lower extremity edema.  Today is SVO2 is 56.9 Blood pressure systolic 120 to 108 mmHg.   Unfortunately he has not been able to wean off milrinone for a persistent period of time, consistent with end stage heart failure. He is not candidate for further advance therapies. Plan to continue home infusion of milrinone as palliative treatment.  Home health services, including palliative care and possible transition to hospice.   Continue medical therapy with amiodarone to prevent VT and ectopies.  After load reduction with hydralazine and isosorbide.   Acute on chronic hypoxemic respiratory failure due to cardiogenic pulmonary edema.  02 saturation today 91% on 2 L/min per [place].  Acute kidney injury superimposed on chronic kidney disease (HCC) -CKD stage 3a at baseline Hyponatremia. Hypokalemia.  Continue to worsen renal function with serum cr at 3,0 with K at 2,9 and serum bicarbonate at 25.  Na 131 and Mg 2,3   Plan to continue diuresis with  torsemide and  K supplementation.   Atrial flutter (HCC) 04/29. Sp direct current cardioversion, returning to sinus rhythm.   Transitioned to oral amiodarone.  Anticoagulation with apixaban.   Epistaxis, has improved. Required packing per ENT.    GERD (gastroesophageal reflux disease) -Continue protonix 40 mg daily.  Type 2 diabetes mellitus with hyperlipidemia (HCC) -Uncontrolled T2DM with hyperglycemia.   Patient was treated with sliding scale insulin and  basal long-acting regimen Pre meal insulin 6 units.   Continue with statin therapy.   COPD (chronic obstructive pulmonary disease) (HCC) -No clinical signs of exacerbation. -Continue current medical management.   DNR (do not resuscitate) -Verified pt is a DNR. Pt is emphatic that he is a DNR/DNI.          Consultants: cardiology, IR Procedures performed: cardiac catheterization, direct current cardioversion, IR tunneled picc catheter   Disposition: Home Diet recommendation:  Cardiac diet DISCHARGE MEDICATION: Allergies as of 03/03/2023       Reactions   Aldactone [spironolactone] Other (See Comments)   Swollen breast   Lipitor [atorvastatin Calcium] Other (See Comments)   Muscle ache   Codeine Rash   Sulfa Drugs Cross Reactors Other (See Comments)   "raw spots"   Sulfamethoxazole-trimethoprim Rash   Other reaction(s): Rash        Medication List     STOP taking these medications    amLODipine 10 MG tablet Commonly known as: NORVASC   dapagliflozin propanediol 10 MG Tabs tablet Commonly known as: [place]  furosemide 40 MG tablet Commonly known as: LASIX   potassium chloride 10 MEQ tablet Commonly known as: KLOR-CON       TAKE these medications    albuterol 108 (90 Base) MCG/ACT inhaler Commonly known as: VENTOLIN HFA Inhale 2 puffs into the lungs every 6 (six) hours as needed for wheezing or shortness of breath.   amiodarone 400 MG tablet Commonly known as: PACERONE Take one  tablet twice daily for seven days through 03/10/23, then continue with one tablet daily starting on 03/11/23 What changed:  medication strength how much to take how to take this when to take this additional instructions   apixaban 5 MG Tabs tablet Commonly known as: ELIQUIS Take 1 tablet (5 mg total) by mouth 2 (two) times daily.   Breztri Aerosphere 160-9-4.8 MCG/ACT Aero Generic drug: Budeson-Glycopyrrol-Formoterol Inhale 2 puffs into the lungs in the morning and at bedtime.   cyanocobalamin 1000 MCG tablet Commonly known as: VITAMIN B12 Take 1,000 mcg by mouth daily.   hydrALAZINE 50 MG tablet Commonly known as: APRESOLINE Take 0.5 tablets (25 mg total) by mouth 3 (three) times daily.   insulin NPH Human 100 UNIT/ML injection Commonly known as: NOVOLIN N Inject 35-40 Units into the skin See admin instructions. Inject 40 units into the skin in the morning and 35 units at night   insulin regular 100 units/mL injection Commonly known as: NOVOLIN R Inject 6-15 Units into the skin 3 (three) times daily before meals. Per sliding scale   isosorbide mononitrate 30 MG 24 hr tablet Commonly known as: IMDUR Take 1 tablet (30 mg total) by mouth daily.   lidocaine-EPINEPHrine 1 %-1:100000 injection Commonly known as: XYLOCAINE W/EPI Inject 20 mLs into the skin once for 1 dose.   Linzess 72 MCG capsule Generic drug: linaclotide Take 72 mcg by mouth every morning.   milrinone 20 MG/100 ML Soln infusion Commonly known as: PRIMACOR Inject 0.01 mg/min into the vein continuous.   nitroGLYCERIN 0.4 MG SL tablet Commonly known as: NITROSTAT Place 1 tablet (0.4 mg total) under the tongue every 5 (five) minutes as needed for chest pain.   pantoprazole 40 MG tablet Commonly known as: PROTONIX Take 40 mg by mouth daily.   potassium chloride SA 20 MEQ tablet Commonly known as: KLOR-CON M Take 1 tablet (20 mEq total) by mouth daily. Start taking on: Mar 04, 2023 What changed:   medication strength how much to take when to take this   rosuvastatin 20 MG tablet Commonly known as: CRESTOR Take 1 tablet by mouth once daily What changed: when to take this   torsemide 20 MG tablet Commonly known as: DEMADEX Take 1 tablet (20 mg total) by mouth daily.   Vitamin D-3 125 MCG (5000 UT) Tabs Take 5,000 Units by mouth daily.        Follow-up Information     [facility] Follow up on 03/11/2023.   Specialty: Cardiology Why: at  8:30 Contact information: [address] [id number] Wilhemina Bonito [place] [address] [phone]               Discharge Exam: Filed Weights   03/01/23 0500 03/02/23 0355 03/03/23 0310  Weight: 77.2 kg 76.3 kg 76 kg   BP (!) 123/46 (BP Location: Left Arm)   Pulse 61   Temp 98.2 F (36.8 C) (Oral)   Resp 18   Ht 5\' 10"  (1.778 m)   Wt 76 kg   SpO2 90%  BMI 24.04 kg/m   Patient with no chest pain or dyspnea, no lower edema, continue very weak and deconditioned.  Neurology awake and alert ENT with mild pallor Cardiovascular with S1 and S2 present and rhythmic with no gallops, rubs or murmurs No JVD Trace lower extremity edema, ted hose in place Respiratory with mild rales at bases bilaterally, with no wheezing or rhonchi Abdomen with no distention   Condition at discharge: stable  The results of significant diagnostics from this hospitalization (including imaging, microbiology, ancillary and laboratory) are listed below for reference.   Imaging Studies: IR Fluoro Guide CV Line Right  Result Date: 03/03/2023 INDICATION: Ischemic cardiomyopathy and heart failure. Central venous access required for Milrinone. EXAM: Tunneled PICC placement MEDICATIONS: None ANESTHESIA/SEDATION: None FLUOROSCOPY: Radiation Exposure Index (as provided by the fluoroscopic device): 3 mGy Kerma COMPLICATIONS: None immediate. PROCEDURE: Informed written consent was obtained from the  patient after a thorough discussion of the procedural risks, benefits and alternatives. All questions were addressed. Maximal Sterile Barrier Technique was utilized including caps, mask, sterile gowns, sterile gloves, sterile drape, hand hygiene and skin antiseptic. A timeout was performed prior to the initiation of the procedure. Patient positioned supine on the angiography table. Right neck and anterior upper chest prepped and draped in the usual sterile fashion. The right internal jugular vein was evaluated with ultrasound and shown to be patent. A permanent ultrasound image was obtained and placed in the patient's medical record. Using sterile gel and a sterile probe cover, the right internal jugular vein was entered with a 21 ga needle during real time ultrasound guidance. 0.018 inch guidewire advanced to the IVC under fluoroscopic guidance. Peel-away sheath placed. Attention then turned to the right anterior upper chest. Following local lidocaine administration, the catheter was tunneled from the chest wall to the venotomy site. The catheter was inserted through the peel-away sheath. The tip of the catheter was positioned at the cavoatrial junction. All lumens of the catheter aspirated and flushed well. The catheter was secured to the skin with suture. Skin overlying the right neck venotomy site was sealed with Dermabond. The insertion site was covered with a Biopatch and sterile dressing. IMPRESSION: Single-lumen right internal jugular vein tunneled PICC is ready for use. Electronically Signed   By: Acquanetta Belling M.D.   On: 03/03/2023 15:29   IR US Guide Vasc Access Right  Result Date: 03/03/2023 INDICATION: Ischemic cardiomyopathy and heart failure. Central venous access required for Milrinone. EXAM: Tunneled PICC placement MEDICATIONS: None ANESTHESIA/SEDATION: None FLUOROSCOPY: Radiation Exposure Index (as provided by the fluoroscopic device): 3 mGy Kerma COMPLICATIONS: None immediate. PROCEDURE:  Informed written consent was obtained from the patient after a thorough discussion of the procedural risks, benefits and alternatives. All questions were addressed. Maximal Sterile Barrier Technique was utilized including caps, mask, sterile gowns, sterile gloves, sterile drape, hand hygiene and skin antiseptic. A timeout was performed prior to the initiation of the procedure. Patient positioned supine on the angiography table. Right neck and anterior upper chest prepped and draped in the usual sterile fashion. The right internal jugular vein was evaluated with ultrasound and shown to be patent. A permanent ultrasound image was obtained and placed in the patient's medical record. Using sterile gel and a sterile probe cover, the right internal jugular vein was entered with a 21 ga needle during real time ultrasound guidance. 0.018 inch guidewire advanced to the IVC under fluoroscopic guidance. Peel-away sheath placed. Attention then turned to the right anterior upper chest. Following local lidocaine administration,  the catheter was tunneled from the chest wall to the venotomy site. The catheter was inserted through the peel-away sheath. The tip of the catheter was positioned at the cavoatrial junction. All lumens of the catheter aspirated and flushed well. The catheter was secured to the skin with suture. Skin overlying the right neck venotomy site was sealed with Dermabond. The insertion site was covered with a Biopatch and sterile dressing. IMPRESSION: Single-lumen right internal jugular vein tunneled PICC is ready for use. Electronically Signed   By: Acquanetta Belling M.D.   On: 03/03/2023 15:29   EP STUDY  Result Date: 02/23/2023 See surgical note for result.  CARDIAC CATHETERIZATION  Result Date: 02/18/2023 1. Elevated filling pressures. 2. Moderate pulmonary venous hypertension. 3. Stable CO on milrinone.   ECHOCARDIOGRAM COMPLETE  Result Date: 02/16/2023    ECHOCARDIOGRAM REPORT   Patient Name:   RECO ISEMINGER Date of Exam: 02/16/2023 Medical Rec #:  [id number]     Height:       70.0 in Accession #:    [id number]    Weight:       179.7 lb Date of Birth:  02-Nov-1945     BSA:          1.994 m Patient Age:    76 years      BP:           117/52 mmHg Patient Gender: M             HR:           73 bpm. Exam Location:  Inpatient Procedure: 2D Echo, Cardiac Doppler and Color Doppler Indications:    Copngestive Heart Failure  History:        Patient has prior history of Echocardiogram examinations, most                 recent 01/02/2023. CHF, CAD, COPD, Carotid Disease and PAD,                 Arrythmias:Atrial Flutter; Risk Factors:Diabetes, Dyslipidemia                 and Hypertension.  Sonographer:    Wallie Char Referring Phys: [phone] [name] D CLEGG IMPRESSIONS  1. Left ventricular ejection fraction, by estimation, is 30%. The left ventricle has moderate to severely decreased function. The left ventricle demonstrates regional wall motion abnormalities with severe hypokinesis of the septal, anterior and inferior  walls. There was relative preservation of anterolateral/inferolateral wall motion. Left ventricular diastolic parameters are consistent with Grade II diastolic dysfunction (pseudonormalization).  2. Right ventricular systolic function is moderately reduced. The right ventricular size is mildly enlarged. There is severely elevated pulmonary artery systolic pressure. The estimated right ventricular systolic pressure is 63.2 mmHg.  3. Left atrial size was mildly dilated.  4. Right atrial size was mildly dilated.  5. The mitral valve is normal in structure. Mild mitral valve regurgitation. No evidence of mitral stenosis.  6. Tricuspid valve regurgitation is mild to moderate.  7. The aortic valve is tricuspid. There is moderate calcification of the aortic valve. Aortic valve regurgitation is moderate. No aortic stenosis is present.  8. The inferior vena cava is dilated in size with <50% respiratory variability,  suggesting right atrial pressure of 15 mmHg. FINDINGS  Left Ventricle: Left ventricular ejection fraction, by estimation, is 30%. The left ventricle has moderate to severely decreased function. The left ventricle demonstrates regional wall motion abnormalities. The left ventricular internal cavity size was normal  in size. There is no left ventricular hypertrophy. Left ventricular diastolic parameters are consistent with Grade II diastolic dysfunction (pseudonormalization). Right Ventricle: The right ventricular size is mildly enlarged. No increase in right ventricular wall thickness. Right ventricular systolic function is moderately reduced. There is severely elevated pulmonary artery systolic pressure. The tricuspid regurgitant velocity is 3.47 m/s, and with an assumed right atrial pressure of 15 mmHg, the estimated right ventricular systolic pressure is 63.2 mmHg. Left Atrium: Left atrial size was mildly dilated. Right Atrium: Right atrial size was mildly dilated. Pericardium: There is no evidence of pericardial effusion. Mitral Valve: The mitral valve is normal in structure. Mild to moderate mitral annular calcification. Mild mitral valve regurgitation. No evidence of mitral valve stenosis. MV peak gradient, 7.7 mmHg. The mean mitral valve gradient is 2.0 mmHg. Tricuspid Valve: The tricuspid valve is normal in structure. Tricuspid valve regurgitation is mild to moderate. Aortic Valve: The aortic valve is tricuspid. There is moderate calcification of the aortic valve. Aortic valve regurgitation is moderate. Aortic regurgitation PHT measures 243 msec. No aortic stenosis is present. Aortic valve mean gradient measures 7.0 mmHg. Aortic valve peak gradient measures 13.0 mmHg. Aortic valve area, by VTI measures 2.18 cm. Pulmonic Valve: The pulmonic valve was normal in structure. Pulmonic valve regurgitation is trivial. Aorta: The aortic root is normal in size and structure. Venous: The inferior vena cava is dilated in  size with less than 50% respiratory variability, suggesting right atrial pressure of 15 mmHg. IAS/Shunts: No atrial level shunt detected by color flow Doppler.  LEFT VENTRICLE PLAX 2D LVIDd:         5.40 cm      Diastology LVIDs:         4.50 cm      LV e' medial:    6.19 cm/s LV PW:         0.90 cm      LV E/e' medial:  20.0 LV IVS:        0.90 cm      LV e' lateral:   12.70 cm/s LVOT diam:     1.90 cm      LV E/e' lateral: 9.8 LV SV:         80 LV SV Index:   40 LVOT Area:     2.84 cm  LV Volumes (MOD) LV vol d, MOD A2C: 152.0 ml LV vol d, MOD A4C: 151.0 ml LV vol s, MOD A2C: 96.9 ml LV vol s, MOD A4C: 96.4 ml LV SV MOD A2C:     55.1 ml LV SV MOD A4C:     151.0 ml LV SV MOD BP:      55.5 ml RIGHT VENTRICLE            IVC RV Basal diam:  4.20 cm    IVC diam: 2.40 cm RV S prime:     5.80 cm/s TAPSE (M-mode): 1.3 cm LEFT ATRIUM             Index        RIGHT ATRIUM           Index LA diam:        4.50 cm 2.26 cm/m   RA Area:     18.00 cm LA Vol (A2C):   64.4 ml 32.29 ml/m  RA Volume:   47.10 ml  23.62 ml/m LA Vol (A4C):   53.7 ml 26.93 ml/m LA Biplane Vol: 61.8 ml 30.99 ml/m  AORTIC VALVE  PULMONIC VALVE AV Area (Vmax):    2.16 cm      PR End Diast Vel: 9.99 msec AV Area (Vmean):   2.26 cm AV Area (VTI):     2.18 cm AV Vmax:           180.50 cm/s AV Vmean:          121.000 cm/s AV VTI:            0.365 m AV Peak Grad:      13.0 mmHg AV Mean Grad:      7.0 mmHg LVOT Vmax:         137.50 cm/s LVOT Vmean:        96.350 cm/s LVOT VTI:          0.280 m LVOT/AV VTI ratio: 0.77 AI PHT:            243 msec AR Vena Contracta: 0.40 cm  AORTA Ao Root diam: 3.20 cm Ao Asc diam:  3.50 cm MITRAL VALVE                TRICUSPID VALVE MV Area (PHT): 3.37 cm     TR Peak grad:   48.2 mmHg MV Area VTI:   2.52 cm     TR Vmax:        347.00 cm/s MV Peak grad:  7.7 mmHg MV Mean grad:  2.0 mmHg     SHUNTS MV Vmax:       1.39 m/s     Systemic VTI:  0.28 m MV Vmean:      67.2 cm/s    Systemic Diam: 1.90 cm MV  Decel Time: 225 msec MV E velocity: 124.00 cm/s MV A velocity: 57.20 cm/s MV E/A ratio:  2.17 Dalton McleanMD Electronically signed by Wilfred Lacy Signature Date/Time: 02/16/2023/4:28:23 PM    Final    DG CHEST PORT 1 VIEW  Result Date: 02/13/2023 CLINICAL DATA:  Shortness of breath EXAM: PORTABLE CHEST 1 VIEW COMPARISON:  02/10/2023 FINDINGS: Status post median sternotomy. Unchanged cardiac and mediastinal contours. Slightly increased small pleural effusions with associated atelectasis. Diffuse interstitial opacities also appear slightly increased. No pneumothorax. No acute osseous abnormality. IMPRESSION: 1. Further slight increase in previously noted small bilateral pleural effusions, with associated atelectasis. 2. Mild diffuse interstitial opacities also appear slightly increased, likely pulmonary edema. Electronically Signed   By: Wiliam Ke M.D.   On: 02/13/2023 11:04   [place] EKG SITE RITE  Result Date: 02/12/2023 If Site Rite image not attached, placement could not be confirmed due to current cardiac rhythm.  DG Chest 2 View  Result Date: 02/10/2023 CLINICAL DATA:  Shortness of breath, swelling, lower extremity edema EXAM: CHEST - 2 VIEW COMPARISON:  01/20/2023 FINDINGS: Status post median sternotomy. Unchanged cardiac and mediastinal contours. Small bilateral pleural effusions, which appear increased from the prior exam. Mild diffuse interstitial opacities, similar to prior. No acute osseous abnormality. No evidence of pneumothorax. IMPRESSION: 1. Small bilateral pleural effusions, which appear increased from the prior exam. 2. Mild diffuse interstitial opacities, similar to prior, likely pulmonary edema. Electronically Signed   By: Wiliam Ke M.D.   On: 02/10/2023 12:48    Microbiology: Results for orders placed or performed during the hospital encounter of 02/10/23  MRSA Next Gen by PCR, Nasal     Status: None   Collection Time: 02/14/23  2:46 PM   Specimen: Nasal Mucosa; Nasal  Swab  Result Value Ref Range Status   MRSA by PCR Next Gen  NOT DETECTED NOT DETECTED Final    Comment: (NOTE) The GeneXpert MRSA Assay (FDA approved for NASAL specimens only), is one component of a comprehensive MRSA colonization surveillance program. It is not intended to diagnose MRSA infection nor to guide or monitor treatment for MRSA infections. Test performance is not FDA approved in patients less than 60 years old. Performed at [facility], [address] [place], [place] [address]     Labs: CBC: Recent Labs  Lab 02/25/23 0528 02/26/23 0726 02/27/23 0635  WBC 6.1 4.9 5.8  HGB 10.8* 10.5* 10.7*  HCT 34.6* 34.1* 33.8*  MCV 85.9 87.2 85.8  PLT 212 203 234   Basic Metabolic Panel: Recent Labs  Lab 02/27/23 0635 02/28/23 0520 03/01/23 0514 03/02/23 0641 03/03/23 0315  NA 133* 132* 132* 132* 131*  K 4.1 3.4* 3.4* 3.6 2.9*  CL 97* 93* 92* 93* 93*  CO2 24 25 24 27 25   GLUCOSE 230* 161* 140* 194* 152*  BUN 80* 77* 80* 81* 82*  CREATININE 2.63* 2.61* 2.77* 2.91* 3.02*  CALCIUM 8.9 9.1 9.1 9.0 8.6*  MG 2.2 2.0 2.2 2.2 2.3  PHOS 3.1  --   --   --   --    Liver Function Tests: Recent Labs  Lab 02/27/23 0635  ALBUMIN 3.1*   CBG: Recent Labs  Lab 03/02/23 1558 03/02/23 2121 03/03/23 0602 03/03/23 1254 03/03/23 1626  GLUCAP 178* 163* 132* 180* 110*    Discharge time spent: greater than 30 minutes.  Signed: Coralie Keens, MD [facility] 03/03/2023

## 2023-03-03 NOTE — Progress Notes (Signed)
Mobility Specialist Progress Note:   03/03/23 0900  Mobility  Activity Ambulated with assistance in hallway  Level of Assistance Contact guard assist, steadying assist  Assistive Device Front wheel walker  Distance Ambulated (ft) 250 ft  Activity Response Tolerated fair  Mobility Referral Yes  $Mobility charge 1 Mobility  Mobility Specialist Start Time (ACUTE ONLY) 0900  Mobility Specialist Stop Time (ACUTE ONLY) 0920  Mobility Specialist Time Calculation (min) (ACUTE ONLY) 20 min   Pt agreeable to mobility session. Required minG assist for safety, pt with x1 LOB. Able to self correct. Left sitting EOB with all needs met.  Addison Lank Mobility Specialist Please contact via SecureChat or  Rehab office at [phone]

## 2023-03-03 NOTE — Progress Notes (Signed)
Per MD order, PICC line removed 1540. Patient education provided: bedrest for 30 minutes post removal; dressing to remain clean, dry, and intact for 24 hours; signs of infection/notify MD for pain, redness, swelling, and/or discharge from site; fever.

## 2023-03-03 NOTE — Progress Notes (Addendum)
[facility] 3E06 [facility]) Hospital Liaison Note  Received referral from [facility] Isidoro Donning regarding patient referral for hospice at home with milrinone drip.  Patient currently in radiology getting PICC line but liaison spoke via phone to patient's daughter [name].  Family to come to hospital this afternoon when patient has returned from PICC line placement so we can discuss options moving forward.  Addendum:  Met with family in room to explain hospice philosophy, goals of care and team approach to care. PMT NP Lamarr Lulas and PMT RN Barrie Folk entered room during conversation and [facility] liaison deferred to Marcelino Duster take lead on GOC conversation. Patient and family in agreement patient will move forward with milrinone at home via home health (due to goal of long term milrinone treatment) and Palliative Care to follow at discharge.  Please call with any hospice or outpatient palliative care related questions or concerns.  [name], RN, BSN [facility] Liaison [phone]

## 2023-03-03 NOTE — TOC Progression Note (Addendum)
Transition of Care [facility]) - Progression Note    Patient Details  Name: [name] MRN: [id number] Date of Birth: January 31, 1946  Transition of Care [facility]) CM/SW Contact  Elliot Cousin, RN Phone Number: [phone] 03/03/2023, 3:49 PM  Clinical Narrative:   HF TOC CM contacted [facility] Coordinator, Pam with new referral for Home Milrinone. Pt has oxygen, shower chair, scale, and Rollator. Daughter, [name] will provide transportation.   [facility] will follow for outpatient palliative services.   Contacted dtr, [name] and review DME. No DME needed. Explained [facility] RN, Pam will contact her to set up time for teaching once she has prior approval from [employer]. Scheduled dc planned for 03/04/2023. Offered choice for Home Health and agreeable to [facility] [place] with new referral.     Expected Discharge Plan: Home w Home Health Services Barriers to Discharge: Continued Medical Work up  Expected Discharge Plan and Services   Discharge Planning Services: CM Consult Post Acute Care Choice: Home Health Living arrangements for the past 2 months: Single Family Home                           HH Arranged: Refused HH           Social Determinants of Health (SDOH) Interventions SDOH Screenings   Food Insecurity: No Food Insecurity (02/24/2023)  Housing: Low Risk  (02/24/2023)  Transportation Needs: No Transportation Needs (02/24/2023)  Utilities: Not At Risk (02/24/2023)  Financial Resource Strain: Low Risk  (02/24/2023)  Tobacco Use: Medium Risk (03/03/2023)    Readmission Risk Interventions    01/21/2023    4:21 PM  Readmission Risk Prevention Plan  Transportation Screening Complete  Medication Review (RN Care Manager) Complete  HRI or Home Care Consult Complete  Palliative Care Screening Not Applicable  Skilled Nursing Facility Not Applicable

## 2023-03-03 NOTE — Progress Notes (Addendum)
Patient ID: [name], male   DOB: [birth year], 77 y.o.   MRN: [id number]     Advanced Heart Failure Rounding Note  PCP-Cardiologist: Connor Miss, MD   Subjective:    - 4/20 while on dobutamine patient went into atrial flutter with RVR, became hypotensive.  Decision made to start amiodarone and transferred to CVICU. - 4/22 CO-OX marginal. Milrinone increased to 0.25 mcg. Diuresed with IV lasix. - 4/24 RHC with elevated filling pressures, transitioned to Lasix gtt at 12 mg per hour.  - 4/25 Epistaxis requiring packing by ENT. Went back into AFL  - 4/29 Nasal Packing removed; TEE/DCCV => NSR, EF 25%, RV mod HK. No clot. Mod AI  - 5/2 Switched back to lasix drip and given metolazone.  - 5/3 Milrinone 0.125 restarted.  -5/6 Lasix drip stopped.   Remains on milrinone 0.125 mcg + amio drip. CO-OX 57%.   Lab Results  Component Value Date   CREATININE 3.02 (H) 03/03/2023   CREATININE 2.91 (H) 03/02/2023   CREATININE 2.77 (H) 03/01/2023     Wants to go home.   Objective:   Weight Range: 76 kg Body mass index is 24.04 kg/m.   Vital Signs:   Temp:  [97.8 F (36.6 C)-98.2 F (36.8 C)] 98.2 F (36.8 C) (05/07 0818) Pulse Rate:  [56-58] 58 (05/07 0818) Resp:  [19-22] 20 (05/07 0818) BP: (97-127)/(44-85) 108/47 (05/07 0818) SpO2:  [93 %-97 %] 97 % (05/07 0818) Weight:  [76 kg] 76 kg (05/07 0310) Last BM Date : 02/27/23  Weight change: Filed Weights   03/01/23 0500 03/02/23 0355 03/03/23 0310  Weight: 77.2 kg 76.3 kg 76 kg    Intake/Output:   Intake/Output Summary (Last 24 hours) at 03/03/2023 0914 Last data filed at 03/03/2023 0818 Gross per 24 hour  Intake 1154.29 ml  Output 2175 ml  Net -1020.71 ml   CVp 13-14   Physical Exam  General:  In bed.  No resp difficulty HEENT: normal Neck: supple. JVP 11-12. Carotids 2+ bilat; no bruits. No lymphadenopathy or thryomegaly appreciated. Cor: PMI nondisplaced. Regular rate & rhythm. No rubs, gallops or murmurs. Lungs:  clear Abdomen: soft, nontender, nondistended. No hepatosplenomegaly. No bruits or masses. Good bowel sounds. Extremities: no cyanosis, clubbing, rash, edema. RUE PICC  Neuro: alert & orientedx3, cranial nerves grossly intact. moves all 4 extremities w/o difficulty. Affect pleasant  Telemetry   SB-SR 50-60s   EKG    No new EKG to review  Labs    CBC No results for input(s): "WBC", "NEUTROABS", "HGB", "HCT", "MCV", "PLT" in the last 72 hours.   Basic Metabolic Panel Recent Labs    40/98/11 0641 03/03/23 0315  NA 132* 131*  K 3.6 2.9*  CL 93* 93*  CO2 27 25  GLUCOSE 194* 152*  BUN 81* 82*  CREATININE 2.91* 3.02*  CALCIUM 9.0 8.6*  MG 2.2 2.3   BNP (last 3 results) Recent Labs    01/20/23 1357 02/02/23 1130 02/10/23 1302  BNP 1,939.2* 2,275.8* 2,508.7*   Imaging   No results found.  Medications:     Scheduled Medications:  apixaban  5 mg Oral BID   Chlorhexidine Gluconate Cloth  6 each Topical Daily   diclofenac Sodium  2 g Topical QID   fluticasone furoate-vilanterol  1 puff Inhalation Daily   hydrALAZINE  37.5 mg Oral Q8H   insulin aspart  0-5 Units Subcutaneous QHS   insulin aspart  0-9 Units Subcutaneous TID WC   insulin aspart  6  Units Subcutaneous TID WC   insulin glargine-yfgn  35 Units Subcutaneous Daily   isosorbide mononitrate  30 mg Oral Daily   linaclotide  72 mcg Oral q morning   pantoprazole  40 mg Oral Daily   potassium chloride  20 mEq Oral Daily   rosuvastatin  20 mg Oral QHS   sodium chloride flush  10-40 mL Intracatheter Q12H   sodium chloride flush  3 mL Intravenous Q12H   torsemide  20 mg Oral Daily   umeclidinium bromide  1 puff Inhalation Daily    Infusions:  amiodarone 30 mg/hr (03/03/23 0700)   milrinone 0.125 mcg/kg/min (03/03/23 0700)     PRN Medications: acetaminophen, ALPRAZolam, ipratropium-albuterol, mouth rinse, phenol, saline, sodium chloride, sodium chloride flush, traZODone   Patient Profile   [name] is  a 77 y.o. male with CAD c/p CABG 2003, HFrEF, chronic DOE, Rt CEA >53yrs ago, Lt CEA 14', HTN, DM2, HLD, PAD w/ Rt SFA and Rt politeal artery vascular stents 15', prostate cancer. Admitted with a/c HFrEF.  Assessment/Plan  1. Acute on chronic systolic CHF: Ischemic cardiomyopathy. Echo in 3/24 with EF 35-40%, RV normal. Echo this admission with EF 30%, moderately decreased RV dysfunction with mild RVE, PASP 63 mmHg, IVC dilated. Low output HF this admission, has required inotrope. RHC today showed ongoing volume overload with stable cardiac output on milrinone 0.25. Prominent v-waves but only mild MR on echo (likely due to volume overload/diastolic dysfunction). GDMT limited by hypotension/AKI. Milrinone stopped 02/21/23. CO-OX trended down 48.5%. Milrinone 0.125 restarted on 5/3. - CO-OX  57% CVP 13-14. Suspect this is a good as we can get him. Creatinine trending up. Hold torsemide today.   - GDMT limited by renal fx  - Cut back hydralazine to 25 mg three times a day and continue imdur 30 daily.   - Long-term prognosis worrisome, not candidate for LVAD with renal dysfunction. Not tolerating milrinone wean.   2.  Atrial flutter: Rate controlled.  Amiodarone IV stopped 4/27 with prolonged QTc and po started.  QTc was shorter on telemetry. S/p TEE/DCCV 4/29.  - Maintaining SR - Stop IV amio. Start po amio 200 mg twice a day  - Continue Eliquis.  3. CAD: Status post CABG 2003.  No ACS this admission. No chest pain.  - Continue Crestor.  - No coronary angiography with no ACS and AKI.  4.Carotid Disease: S/p b/l CEA - continue statin 5. PAD: s/p intervention 2015 Rt SFA and Rt politeal artery vascular stents - continue statin  6. Type 2 DM: Per [facility].   - On SSI  7. COPD/emphysema: Uses 2 L home O2 as needed, requiring oxygen here.  8. AKI on CKD stage 4: Baseline creatinine ~ 2.2.  Creatinine trended up off milrinone, now restarted with stabilization. 2.36>2.66>2.6>2.8>2.91>3  - would continue  milrinone to allow for renal recovery 9. Epistaxis: 4/24 Transexamic used + Afrin.  Nasal packing 4/25, removed 4/29. No further visible bleeding. Hgb stable.  - monitor closely w/ Eliquis restart - appreciate ENT  10: GOC: DNR. Palliative Care met him   I have placed an order for tunneled picc. Can remove PICC in RUE once placed.  I discussed with him home milrinone and that this is a palliative medication to hopefully provide quality/additional time with his family but there is no guarantee. We discussed that in his case this is for end stage heart failure.     We discussed home milrinone and the care surrounding that. He will need  weekly labs.  He is not a VAD candidate with CKD stage IV.  He would like to be home Thursday to visit with his family and be with his wife. I answered all questions.  TOC working on home milrinone.    Tonye Becket, NP  9:14 AM

## 2023-03-04 ENCOUNTER — Other Ambulatory Visit (HOSPITAL_COMMUNITY): Payer: Self-pay

## 2023-03-04 DIAGNOSIS — E1122 Type 2 diabetes mellitus with diabetic chronic kidney disease: Secondary | ICD-10-CM | POA: Diagnosis not present

## 2023-03-04 DIAGNOSIS — I5043 Acute on chronic combined systolic (congestive) and diastolic (congestive) heart failure: Secondary | ICD-10-CM | POA: Diagnosis not present

## 2023-03-04 DIAGNOSIS — Z66 Do not resuscitate: Secondary | ICD-10-CM | POA: Diagnosis not present

## 2023-03-04 DIAGNOSIS — I5023 Acute on chronic systolic (congestive) heart failure: Secondary | ICD-10-CM | POA: Diagnosis not present

## 2023-03-04 DIAGNOSIS — I130 Hypertensive heart and chronic kidney disease with heart failure and stage 1 through stage 4 chronic kidney disease, or unspecified chronic kidney disease: Secondary | ICD-10-CM | POA: Diagnosis not present

## 2023-03-04 DIAGNOSIS — I509 Heart failure, unspecified: Secondary | ICD-10-CM | POA: Diagnosis not present

## 2023-03-04 DIAGNOSIS — J439 Emphysema, unspecified: Secondary | ICD-10-CM | POA: Diagnosis not present

## 2023-03-04 DIAGNOSIS — J9621 Acute and chronic respiratory failure with hypoxia: Secondary | ICD-10-CM | POA: Diagnosis not present

## 2023-03-04 DIAGNOSIS — N1831 Chronic kidney disease, stage 3a: Secondary | ICD-10-CM | POA: Diagnosis not present

## 2023-03-04 DIAGNOSIS — I4729 Other ventricular tachycardia: Secondary | ICD-10-CM | POA: Diagnosis not present

## 2023-03-04 DIAGNOSIS — D6832 Hemorrhagic disorder due to extrinsic circulating anticoagulants: Secondary | ICD-10-CM | POA: Diagnosis not present

## 2023-03-04 DIAGNOSIS — Z515 Encounter for palliative care: Secondary | ICD-10-CM | POA: Diagnosis not present

## 2023-03-04 DIAGNOSIS — I959 Hypotension, unspecified: Secondary | ICD-10-CM | POA: Diagnosis not present

## 2023-03-04 DIAGNOSIS — I272 Pulmonary hypertension, unspecified: Secondary | ICD-10-CM | POA: Diagnosis not present

## 2023-03-04 LAB — BASIC METABOLIC PANEL
Anion gap: 11 (ref 5–15)
BUN: 75 mg/dL — ABNORMAL HIGH (ref 8–23)
CO2: 27 mmol/L (ref 22–32)
Calcium: 8.6 mg/dL — ABNORMAL LOW (ref 8.9–10.3)
Chloride: 93 mmol/L — ABNORMAL LOW (ref 98–111)
Creatinine, Ser: 2.73 mg/dL — ABNORMAL HIGH (ref 0.61–1.24)
GFR, Estimated: 24 mL/min — ABNORMAL LOW (ref >60)
Glucose, Bld: 94 mg/dL (ref 70–99)
Potassium: 3 mmol/L — ABNORMAL LOW (ref 3.5–5.1)
Sodium: 131 mmol/L — ABNORMAL LOW (ref 135–145)

## 2023-03-04 LAB — MAGNESIUM: Magnesium: 2.4 mg/dL (ref 1.7–2.4)

## 2023-03-04 LAB — GLUCOSE, CAPILLARY
Glucose-Capillary: 94 mg/dL (ref 70–99)
Glucose-Capillary: 309 mg/dL — ABNORMAL HIGH (ref 70–99)
Glucose-Capillary: 275 mg/dL — ABNORMAL HIGH (ref 70–99)

## 2023-03-04 LAB — COOXEMETRY PANEL
Carboxyhemoglobin: 1.6 % — ABNORMAL HIGH (ref 0.5–1.5)
Methemoglobin: <0.7 % (ref 0.0–1.5)
O2 Saturation: 61.3 %
Total hemoglobin: 10.6 g/dL — ABNORMAL LOW (ref 12.0–16.0)

## 2023-03-04 MED ORDER — AMIODARONE HCL 200 MG PO TABS: ORAL_TABLET | ORAL | 0 refills | Status: DC

## 2023-03-04 MED ORDER — POTASSIUM CHLORIDE CRYS ER 20 MEQ PO TBCR: 40.0000 meq | EXTENDED_RELEASE_TABLET | Freq: Every day | ORAL | 0 refills | Status: DC

## 2023-03-04 MED ORDER — POTASSIUM CHLORIDE CRYS ER 20 MEQ PO TBCR
40.0000 meq | EXTENDED_RELEASE_TABLET | Freq: Every day | ORAL | Status: DC
  Administered 2023-03-04: 40 meq via ORAL
  Filled 2023-03-04: qty 2

## 2023-03-04 MED ORDER — TORSEMIDE 20 MG PO TABS: 40.0000 mg | ORAL_TABLET | Freq: Two times a day (BID) | ORAL | 0 refills | Status: DC

## 2023-03-04 MED ORDER — TORSEMIDE 40 MG PO TABS: 40.0000 mg | ORAL_TABLET | Freq: Two times a day (BID) | ORAL | 0 refills | Status: DC

## 2023-03-04 MED ORDER — MILRINONE LACTATE IN DEXTROSE 20-5 MG/100ML-% IV SOLN: 0.1250 ug/kg/min | INTRAVENOUS | 52 refills | Status: DC

## 2023-03-04 NOTE — TOC Transition Note (Signed)
Transition of Care [facility]) - CM/SW Discharge Note   Patient Details  Name: [name] MRN: [id number] Date of Birth: 1945/12/09  Transition of Care [facility]) CM/SW Contact:  Leone Haven, RN Phone Number: 03/04/2023, 11:58 AM   Clinical Narrative:    Patient is set up with [facility] with Jeri Modena for medication and [facility] for [facility] by previous NCM.  This NCM spoke with Pam she states she will be here around 3 pm to hook patient up to the milrinone drip to go home with.  Daughter will transport patient home.  NCM also notified [facility] outpatient [facility]iative services , Shanita.       Barriers to Discharge: Continued Medical Work up   Patient Goals and CMS Choice      Discharge Placement                         Discharge Plan and Services Additional resources added to the After Visit Summary for     Discharge Planning Services: CM Consult Post Acute Care Choice: Home Health                    HH Arranged: Refused HH          Social Determinants of Health (SDOH) Interventions SDOH Screenings   Food Insecurity: No Food Insecurity (02/24/2023)  Housing: Low Risk  (02/24/2023)  Transportation Needs: No Transportation Needs (02/24/2023)  Utilities: Not At Risk (02/24/2023)  Financial Resource Strain: Low Risk  (02/24/2023)  Tobacco Use: Medium Risk (03/03/2023)     Readmission Risk Interventions    01/21/2023    4:21 PM  Readmission Risk Prevention Plan  Transportation Screening Complete  Medication Review (RN Care Manager) Complete  HRI or Home Care Consult Complete  Palliative Care Screening Not Applicable  Skilled Nursing Facility Not Applicable

## 2023-03-04 NOTE — Plan of Care (Signed)

## 2023-03-04 NOTE — Progress Notes (Signed)
Patient seen and examined, discharged summary completed by Dr. Ella Jubilee yesterday, please see his note for details, torsemide dose changed to 40 Mg twice daily to start 5/9 and amiodarone 400 mg twice daily X 7 days followed by 200 mg twice daily -He will discharge today after milrinone pump set up -Close follow-up in CHF clinic, will need ongoing palliative care conversations  Zannie Cove

## 2023-03-04 NOTE — Discharge Instructions (Signed)

## 2023-03-04 NOTE — Progress Notes (Signed)
MC 3E06 [facility]) Hospital Liaison Note  Noted plan for discharge today after he is hooked up to home milrinone drip.  Outpatient palliative care will contact patient's daughter [name] per request to set up services post discharge.  Please call with any hospice or outpatient palliative care questions or concerns.  [name], RN, BSN [facility] Liaison [phone]

## 2023-03-04 NOTE — Progress Notes (Addendum)
Patient ID: [name], male   DOB: [birth year], 77 y.o.   MRN: [id number]     Advanced Heart Failure Rounding Note  PCP-Cardiologist: Kristeen Miss, MD   Subjective:    - 4/20 while on dobutamine patient went into atrial flutter with RVR, became hypotensive.  Decision made to start amiodarone and transferred to CVICU. - 4/22 CO-OX marginal. Milrinone increased to 0.25 mcg. Diuresed with IV lasix. - 4/24 RHC with elevated filling pressures, transitioned to Lasix gtt at 12 mg per hour.  - 4/25 Epistaxis requiring packing by ENT. Went back into AFL  - 4/29 Nasal Packing removed; TEE/DCCV => NSR, EF 25%, RV mod HK. No clot. Mod AI  - 5/2 Switched back to lasix drip and given metolazone.  - 5/3 Milrinone 0.125 restarted.  -5/6 Lasix drip stopped.   Remains on milrinone 0.125 mcg + amio drip. CO-OX 61%  Lab Results  Component Value Date   CREATININE 2.73 (H) 03/04/2023   CREATININE 3.02 (H) 03/03/2023   CREATININE 2.91 (H) 03/02/2023    Feels good wants to go home.   Objective:   Weight Range: 75.2 kg Body mass index is 23.79 kg/m.   Vital Signs:   Temp:  [97.6 F (36.4 C)-98.2 F (36.8 C)] 97.6 F (36.4 C) (05/08 0448) Pulse Rate:  [58-61] 61 (05/07 1504) Resp:  [17-20] 20 (05/08 0448) BP: (108-138)/(44-55) 134/54 (05/08 0558) SpO2:  [90 %-97 %] 95 % (05/08 0448) Weight:  [75.2 kg] 75.2 kg (05/08 0448) Last BM Date : 03/03/23  Weight change: Filed Weights   03/02/23 0355 03/03/23 0310 03/04/23 0448  Weight: 76.3 kg 76 kg 75.2 kg    Intake/Output:   Intake/Output Summary (Last 24 hours) at 03/04/2023 0723 Last data filed at 03/04/2023 0602 Gross per 24 hour  Intake 834.59 ml  Output 1000 ml  Net -165.41 ml     Physical Exam  General:  Well appearing. No resp difficulty HEENT: normal Neck: supple. JVP 9-10 . Carotids 2+ bilat; no bruits. No lymphadenopathy or thryomegaly appreciated. Cor: PMI nondisplaced. Regular rate & rhythm. No rubs, gallops or murmurs. R  upper chest tunneled PICC  Lungs: clear Abdomen: soft, nontender, nondistended. No hepatosplenomegaly. No bruits or masses. Good bowel sounds. Extremities: no cyanosis, clubbing, rash, R and LLE  complreesion edema Neuro: alert & orientedx3, cranial nerves grossly intact. moves all 4 extremities w/o difficulty. Affect pleasant  Telemetry   SR 70s   EKG    No new EKG to review  Labs    CBC No results for input(s): "WBC", "NEUTROABS", "HGB", "HCT", "MCV", "PLT" in the last 72 hours.   Basic Metabolic Panel Recent Labs    40/98/11 0315 03/04/23 0550  NA 131* 131*  K 2.9* 3.0*  CL 93* 93*  CO2 25 27  GLUCOSE 152* 94  BUN 82* 75*  CREATININE 3.02* 2.73*  CALCIUM 8.6* 8.6*  MG 2.3 2.4   BNP (last 3 results) Recent Labs    01/20/23 1357 02/02/23 1130 02/10/23 1302  BNP 1,939.2* 2,275.8* 2,508.7*   Imaging   IR Fluoro Guide CV Line Right  Result Date: 03/03/2023 INDICATION: Ischemic cardiomyopathy and heart failure. Central venous access required for Milrinone. EXAM: Tunneled PICC placement MEDICATIONS: None ANESTHESIA/SEDATION: None FLUOROSCOPY: Radiation Exposure Index (as provided by the fluoroscopic device): 3 mGy Kerma COMPLICATIONS: None immediate. PROCEDURE: Informed written consent was obtained from the patient after a thorough discussion of the procedural risks, benefits and alternatives. All questions were addressed. Maximal Sterile Barrier  Technique was utilized including caps, mask, sterile gowns, sterile gloves, sterile drape, hand hygiene and skin antiseptic. A timeout was performed prior to the initiation of the procedure. Patient positioned supine on the angiography table. Right neck and anterior upper chest prepped and draped in the usual sterile fashion. The right internal jugular vein was evaluated with ultrasound and shown to be patent. A permanent ultrasound image was obtained and placed in the patient's medical record. Using sterile gel and a sterile probe  cover, the right internal jugular vein was entered with a 21 ga needle during real time ultrasound guidance. 0.018 inch guidewire advanced to the IVC under fluoroscopic guidance. Peel-away sheath placed. Attention then turned to the right anterior upper chest. Following local lidocaine administration, the catheter was tunneled from the chest wall to the venotomy site. The catheter was inserted through the peel-away sheath. The tip of the catheter was positioned at the cavoatrial junction. All lumens of the catheter aspirated and flushed well. The catheter was secured to the skin with suture. Skin overlying the right neck venotomy site was sealed with Dermabond. The insertion site was covered with a Biopatch and sterile dressing. IMPRESSION: Single-lumen right internal jugular vein tunneled PICC is ready for use. Electronically Signed   By: Acquanetta Belling M.D.   On: 03/03/2023 15:29   IR US Guide Vasc Access Right  Result Date: 03/03/2023 INDICATION: Ischemic cardiomyopathy and heart failure. Central venous access required for Milrinone. EXAM: Tunneled PICC placement MEDICATIONS: None ANESTHESIA/SEDATION: None FLUOROSCOPY: Radiation Exposure Index (as provided by the fluoroscopic device): 3 mGy Kerma COMPLICATIONS: None immediate. PROCEDURE: Informed written consent was obtained from the patient after a thorough discussion of the procedural risks, benefits and alternatives. All questions were addressed. Maximal Sterile Barrier Technique was utilized including caps, mask, sterile gowns, sterile gloves, sterile drape, hand hygiene and skin antiseptic. A timeout was performed prior to the initiation of the procedure. Patient positioned supine on the angiography table. Right neck and anterior upper chest prepped and draped in the usual sterile fashion. The right internal jugular vein was evaluated with ultrasound and shown to be patent. A permanent ultrasound image was obtained and placed in the patient's medical  record. Using sterile gel and a sterile probe cover, the right internal jugular vein was entered with a 21 ga needle during real time ultrasound guidance. 0.018 inch guidewire advanced to the IVC under fluoroscopic guidance. Peel-away sheath placed. Attention then turned to the right anterior upper chest. Following local lidocaine administration, the catheter was tunneled from the chest wall to the venotomy site. The catheter was inserted through the peel-away sheath. The tip of the catheter was positioned at the cavoatrial junction. All lumens of the catheter aspirated and flushed well. The catheter was secured to the skin with suture. Skin overlying the right neck venotomy site was sealed with Dermabond. The insertion site was covered with a Biopatch and sterile dressing. IMPRESSION: Single-lumen right internal jugular vein tunneled PICC is ready for use. Electronically Signed   By: Acquanetta Belling M.D.   On: 03/03/2023 15:29    Medications:     Scheduled Medications:  amiodarone  400 mg Oral BID   apixaban  5 mg Oral BID   Chlorhexidine Gluconate Cloth  6 each Topical Daily   fluticasone furoate-vilanterol  1 puff Inhalation Daily   hydrALAZINE  25 mg Oral Q8H   insulin aspart  0-5 Units Subcutaneous QHS   insulin aspart  0-9 Units Subcutaneous TID WC   insulin aspart  6 Units Subcutaneous TID WC   insulin glargine-yfgn  35 Units Subcutaneous Daily   isosorbide mononitrate  30 mg Oral Daily   linaclotide  72 mcg Oral q morning   pantoprazole  40 mg Oral Daily   potassium chloride  20 mEq Oral Daily   rosuvastatin  20 mg Oral QHS   sodium chloride flush  10-40 mL Intracatheter Q12H   sodium chloride flush  3 mL Intravenous Q12H   umeclidinium bromide  1 puff Inhalation Daily    Infusions:  sodium chloride 7 mL/hr at 03/03/23 1900   milrinone 0.125 mcg/kg/min (03/03/23 1900)     PRN Medications: sodium chloride, acetaminophen, ALPRAZolam, diclofenac Sodium, ipratropium-albuterol, mouth  rinse, phenol, saline, sodium chloride, sodium chloride flush, traZODone   Patient Profile   Mr. Youngs is a 77 y.o. male with CAD c/p CABG 2003, HFrEF, chronic DOE, Rt CEA >69yrs ago, Lt CEA 14', HTN, DM2, HLD, PAD w/ Rt SFA and Rt politeal artery vascular stents 15', prostate cancer. Admitted with a/c HFrEF.  Assessment/Plan  1. Acute on chronic systolic CHF: Ischemic cardiomyopathy. Echo in 3/24 with EF 35-40%, RV normal. Echo this admission with EF 30%, moderately decreased RV dysfunction with mild RVE, PASP 63 mmHg, IVC dilated. Low output HF this admission, has required inotrope. RHC today showed ongoing volume overload with stable cardiac output on milrinone 0.25. Prominent v-waves but only mild MR on echo (likely due to volume overload/diastolic dysfunction). GDMT limited by hypotension/AKI. Milrinone stopped 02/21/23. CO-OX trended down 48.5%. Milrinone 0.125 restarted on 5/3. - CO-OX  61% on milrinone 0.125 mcg. He has single lumen tunneled PICC.  -  Volume status looks ok. Start torsemide 40 mg twice a day tomorrow.  Will need Kdur 40 meq daily.  - GDMT limited by renal fx  - Continue hydralazine to 25 mg three times a day and continue imdur 30 daily.   - Long-term prognosis worrisome, not candidate for LVAD with renal dysfunction. Not tolerating milrinone wean.   2.  Atrial flutter: Rate controlled.  Amiodarone IV stopped 4/27 with prolonged QTc and po started.  QTc was shorter on telemetry. S/p TEE/DCCV 4/29.  - Maintaining SR - Continue po amio 400 mg twice a day x7 the 200 mg twice a day.   - Continue Eliquis.  3. CAD: Status post CABG 2003.  No ACS this admission. No chest pain.  - Continue Crestor.  - No coronary angiography with no ACS and AKI.  4.Carotid Disease: S/p b/l CEA - continue statin 5. PAD: s/p intervention 2015 Rt SFA and Rt politeal artery vascular stents - continue statin  6. Type 2 DM: Per [facility].   - On SSI  7. COPD/emphysema: Uses 2 L home O2 as needed,  requiring oxygen here.  8. AKI on CKD stage 4: Baseline creatinine ~ 2.2.  Creatinine trended up off milrinone, now restarted with stabilization. 2.36>2.66>2.6>2.8>2.91>3 >2.7  - would continue milrinone to allow for renal recovery 9. Epistaxis: 4/24 Transexamic used + Afrin.  Nasal packing 4/25, removed 4/29. No further visible bleeding. Hgb stable.  - monitor closely w/ Eliquis restart - appreciate ENT  10: GOC: DNR. Palliative Care met him   [facility] for d/c today once connected to home milrinone.  Home meds - He requests meds go to [facility] on [address].  Torsemide 40 mg twice a day --> start 5/9  Amio 400 mg twice a day x7 days the amio 200 mg twice a day.  Eliquis 5 mg twice a day  Hydralalzine 25 mg three times a day Imdur 30 daily.  Crestor 20 mg daily.  KDUR 40 meq daily  Milrinone 0.125 mcg.   HF follow up has been set up. OK for d/c to home.    Tonye Becket, NP  7:23 AM

## 2023-03-05 DIAGNOSIS — N1831 Chronic kidney disease, stage 3a: Secondary | ICD-10-CM | POA: Diagnosis not present

## 2023-03-05 DIAGNOSIS — I4892 Unspecified atrial flutter: Secondary | ICD-10-CM | POA: Diagnosis not present

## 2023-03-05 DIAGNOSIS — E1122 Type 2 diabetes mellitus with diabetic chronic kidney disease: Secondary | ICD-10-CM | POA: Diagnosis not present

## 2023-03-05 DIAGNOSIS — M1990 Unspecified osteoarthritis, unspecified site: Secondary | ICD-10-CM | POA: Diagnosis not present

## 2023-03-05 DIAGNOSIS — E1151 Type 2 diabetes mellitus with diabetic peripheral angiopathy without gangrene: Secondary | ICD-10-CM | POA: Diagnosis not present

## 2023-03-05 DIAGNOSIS — I130 Hypertensive heart and chronic kidney disease with heart failure and stage 1 through stage 4 chronic kidney disease, or unspecified chronic kidney disease: Secondary | ICD-10-CM | POA: Diagnosis not present

## 2023-03-05 DIAGNOSIS — I2510 Atherosclerotic heart disease of native coronary artery without angina pectoris: Secondary | ICD-10-CM | POA: Diagnosis not present

## 2023-03-05 DIAGNOSIS — I872 Venous insufficiency (chronic) (peripheral): Secondary | ICD-10-CM | POA: Diagnosis not present

## 2023-03-05 DIAGNOSIS — J9611 Chronic respiratory failure with hypoxia: Secondary | ICD-10-CM | POA: Diagnosis not present

## 2023-03-05 DIAGNOSIS — I5084 End stage heart failure: Secondary | ICD-10-CM | POA: Diagnosis not present

## 2023-03-05 DIAGNOSIS — I5043 Acute on chronic combined systolic (congestive) and diastolic (congestive) heart failure: Secondary | ICD-10-CM | POA: Diagnosis not present

## 2023-03-05 DIAGNOSIS — I6529 Occlusion and stenosis of unspecified carotid artery: Secondary | ICD-10-CM | POA: Diagnosis not present

## 2023-03-05 DIAGNOSIS — J449 Chronic obstructive pulmonary disease, unspecified: Secondary | ICD-10-CM | POA: Diagnosis not present

## 2023-03-06 ENCOUNTER — Encounter (HOSPITAL_COMMUNITY): Payer: Medicare HMO

## 2023-03-09 NOTE — Progress Notes (Incomplete)
ADVANCED HF CLINIC NOTE  Primary Care: [name], Adelfa Koh, MD HF Cardiologist: Dr. Gala Romney Pulmonary: Dr Thora Lance  HPI: [name] is a 77 y.o. male with CAD c/p CABG 2003, chronic diastolic CHF, chronic DOE, Rt CEA >44yrs ago, Lt CEA 14', HTN, DM2, HLD, PAD w/ Rt SFA and Rt politeal artery vascular stents 15', prostate cancer.    Admitted 11/23 with a/c systolic heart failure, newly reduced EF. Echo EF 30-35%, mild concentric LVH, GIIDD, RV function mildly reduced, mild MR, mild to mod TR, mild aortic valve regurg. Underwent RHC showing low filling pressures and preserved CO. GDMT started but limited by AKI. He was discharged home with oxygen, weight 172 lbs.  He was seen in the HF clinic 10/07/22. EKG showed new  aflutter. Started on eliquis and placed on amiodarone. Plan for TEE/DC-CV 10/22/22. Had blood work but it was not processed.   Had blood work with K >7.5.  Sent to ED and admitted on 10/17/22 with hyperkalemia and worsening renal function. Renal US negative for hydronephrosis. Lotrel, faxiga, eplerenone, lasix stopped.   10/22/22 S/P TEE/DC-CV--> EF 30-35% conversion to SR  Echo 01/02/23 EF 35-40% mild AS/mod AI mild MR. Seen for f/u. WMA on echo c/w inferior/inferolateral MI, suspected probable SVG failure. Given CKD and lack of angina did not pursue cath.  Admitted 03/6-03/30/24 with acute on chronic CHF. He was then redmitted 04/16-05/07/24 with acute on chronic CHF with low-output. He was significantly volume overloaded. Scr began to rise with attempts to diurese. Required addition of inotrope support. Echo with EF 30%, RWMA, RV moderately reduced, RVSP severely elevated, mild MR. RHC on milrinone with stable CO and elevated filling pressures. Course c/b atrial flutter with RVR. He chemically converted with amiodarone but later had recurrence and underwent TEE guided DCCV. He later failed milrinone wean with recurrent volume overload and AKI. Milrinone restarted. He was discharged  home on palliative inotrope. Not a candidate for advanced therapies.   He is here today for hospital follow-up.   Cardiac Studies: RHC 04/24 on milrinone 0.25 Hemodynamics (mmHg) RA mean 12 RV 55/16 PA 57/22, mean 34 PCWP mean 24, v-waves to 35 Oxygen saturations: PA 51% AO 86% Cardiac Output (Fick) 4.71  Cardiac Index (Fick) 2.37 PVR 2.1 WU    RHC 11/23   RA = 8 RV = 48/10 PA = 45/20 (34) PCW = 13 Fick cardiac output/index = 6.0/3.0 Thermo CO/CI = 4.8/2.4  PVR = 3.3 WU (Fick) Ao sat = 90% PA sat = 55%, 58%   TEE, 02/23/23: EF 25%, RV moderately HK, severe LAE, moderate AI, mild MR, mild TR  Echo 02/16/23: EF 30%, rWMA, RV moderately reduced, severely elevated RVSP, mild MR  TEE 10/22/22 EF 30-35%    - Echo (11/23): EF 30-35%, mild concentric LVH, GIIDD, RV function mildly reduced, mild MR, mild to mod TR, mild aortic valve regurg  - Echo (9/22): EF 60-65%, normal RV function, LA mildly elevated, trivial MR/TR, mild-mod aortic valve regurg    Past Medical History:  Diagnosis Date   Acute hypoxemic respiratory failure (HCC) 01/20/2023   Acute kidney injury superimposed on chronic kidney disease (HCC) 10/19/2022   Acute on chronic systolic CHF (congestive heart failure) (HCC) 01/21/2023   Arthritis    CAD (coronary artery disease)    Carotid artery occlusion    Diabetes mellitus    fasting 100-120s   Dizziness    Dysrhythmia    skips beats   GERD (gastroesophageal reflux disease)  History of kidney stones    Hyperlipidemia    Hypertension    IHD (ischemic heart disease)    Prior CABG in 2003   Normal nuclear stress test 03/2011   Peripheral vascular disease (HCC)    Prostate cancer (HCC)    Torn rotator cuff     Current Outpatient Medications  Medication Sig Dispense Refill   albuterol (VENTOLIN HFA) 108 (90 Base) MCG/ACT inhaler Inhale 2 puffs into the lungs every 6 (six) hours as needed for wheezing or shortness of breath.     amiodarone  (PACERONE) 200 MG tablet Take two tablets (400mg  total) twice daily for seven days then 200mg  bid 74 tablet 0   apixaban (ELIQUIS) 5 MG TABS tablet Take 1 tablet (5 mg total) by mouth 2 (two) times daily. 60 tablet 8   Budeson-Glycopyrrol-Formoterol (BREZTRI AEROSPHERE) 160-9-4.8 MCG/ACT AERO Inhale 2 puffs into the lungs in the morning and at bedtime. 5.9 g 0   Cholecalciferol (VITAMIN D-3) 125 MCG (5000 UT) TABS Take 5,000 Units by mouth daily.     cyanocobalamin (VITAMIN B12) 1000 MCG tablet Take 1,000 mcg by mouth daily.     hydrALAZINE (APRESOLINE) 50 MG tablet Take 0.5 tablets (25 mg total) by mouth 3 (three) times daily.     insulin NPH Human (NOVOLIN Duke) 100 UNIT/ML injection Inject 35-40 Units into the skin See admin instructions. Inject 40 units into the skin in the morning and 35 units at night     insulin regular (NOVOLIN R) 100 units/mL injection Inject 6-15 Units into the skin 3 (three) times daily before meals. Per sliding scale     isosorbide mononitrate (IMDUR) 30 MG 24 hr tablet Take 1 tablet (30 mg total) by mouth daily. 90 tablet 3   LINZESS 72 MCG capsule Take 72 mcg by mouth every morning.     milrinone (PRIMACOR) 20 MG/100 ML SOLN infusion Inject 0.01 mg/min into the vein continuous. [facility] Infusion 200 mL 52   nitroGLYCERIN (NITROSTAT) 0.4 MG SL tablet Place 1 tablet (0.4 mg total) under the tongue every 5 (five) minutes as needed for chest pain. 25 tablet 6   pantoprazole (PROTONIX) 40 MG tablet Take 40 mg by mouth daily.     potassium chloride SA (KLOR-CON M) 20 MEQ tablet Take 2 tablets (40 mEq total) by mouth daily. 60 tablet 0   rosuvastatin (CRESTOR) 20 MG tablet Take 1 tablet by mouth once daily (Patient taking differently: Take 20 mg by mouth at bedtime.) 90 tablet 3   torsemide (DEMADEX) 20 MG tablet Take 2 tablets (40 mg total) by mouth 2 (two) times daily. 120 tablet 0   No current facility-administered medications for this visit.   Allergies  Allergen  Reactions   Aldactone [Spironolactone] Other (See Comments)    Swollen breast   Lipitor [Atorvastatin Calcium] Other (See Comments)    Muscle ache   Codeine Rash   Sulfa Drugs Cross Reactors Other (See Comments)    "raw spots"   Sulfamethoxazole-Trimethoprim Rash    Other reaction(s): Rash   Social History   Socioeconomic History   Marital status: Married    Spouse name: Not on file   Number of children: 2   Years of education: Not on file   Highest education level: Not on file  Occupational History   Not on file  Tobacco Use   Smoking status: Former    Packs/day: 1.00    Years: 20.00    Additional pack years: 0.00  Total pack years: 20.00    Types: Cigarettes    Quit date: 10/27/2001    Years since quitting: 21.3    Passive exposure: Never   Smokeless tobacco: Never  Vaping Use   Vaping Use: Never used  Substance and Sexual Activity   Alcohol use: No    Alcohol/week: 0.0 standard drinks of alcohol   Drug use: No   Sexual activity: Not Currently  Other Topics Concern   Not on file  Social History Narrative   Not on file   Social Determinants of Health   Financial Resource Strain: Low Risk  (02/24/2023)   Overall Financial Resource Strain (CARDIA)    Difficulty of Paying Living Expenses: Not very hard  Food Insecurity: No Food Insecurity (02/24/2023)   Hunger Vital Sign    Worried About Running Out of Food in the Last Year: Never true    Ran Out of Food in the Last Year: Never true  Transportation Needs: No Transportation Needs (02/24/2023)   PRAPARE - Administrator, Civil Service (Medical): No    Lack of Transportation (Non-Medical): No  Physical Activity: Not on file  Stress: Not on file  Social Connections: Not on file  Intimate Partner Violence: Not At Risk (02/26/2023)   Humiliation, Afraid, Rape, and Kick questionnaire    Fear of Current or Ex-Partner: No    Emotionally Abused: No    Physically Abused: No    Sexually Abused: No   Family  History  Problem Relation Age of Onset   Heart attack Mother    Diabetes Mother    Heart disease Mother    Hypertension Mother    Heart attack Father    Heart disease Father    Hypertension Father    Diabetes Brother    Diabetes Sister    Diabetes Daughter    Heart disease Daughter    Hypertension Daughter    Diabetes Sister    Breast cancer Neg Hx    Prostate cancer Neg Hx    Colon cancer Neg Hx    Pancreatic cancer Neg Hx    There were no vitals taken for this visit.  Wt Readings from Last 3 Encounters:  03/04/23 75.2 kg (165 lb 12.8 oz)  02/02/23 84.8 kg (187 lb)  01/26/23 82.1 kg (181 lb)   PHYSICAL EXAM: General:  Well appearing. No resp difficulty HEENT: normal Neck: supple. no JVD. Carotids 2+ bilat; no bruits. No lymphadenopathy or thryomegaly appreciated. Cor: PMI nondisplaced. Regular rate & rhythm. No rubs, gallops or murmurs. Lungs: clear Abdomen: soft, nontender, nondistended. No hepatosplenomegaly. No bruits or masses. Good bowel sounds. Extremities: no cyanosis, clubbing, rash, edema Neuro: alert & orientedx3, cranial nerves grossly intact. moves all 4 extremities w/o difficulty. Affect pleasant   ECG : NSR 64 1AVB . IVCD Personally reviewed   ASSESSMENT & PLAN:  Chronic combined systolic and diastolic CHF, previously HFpEF now HFrEF -ischemic cardiomyopathy - Echo 9/22: EF 60-65%, normal RV function, LA mildly elevated, trivial MR/TR, mild-mod aortic valve regurg  - Echo 11/23: EF 30-35%, mild concentric LVH, GIIDD, RV function mildly reduced, mild MR, mild to mod TR, mild aortic valve regurg - RHC 11/23 with low filling pressures and preserved cardiac output.  - Echo 01/02/23 EF 35-40%, WMA consistent with inferior/inferolateral MI, mild AS/mod AI mild MR - Admit 04/24 with low-output HF. Echo 04/24: EF 30%, RV moderately reduced, severely elevated RVSP, moderate MR. RHC with stable CO on milrinone 0.25. Failed milrinone wean. - Had  initially  thought reduced EF d/t AF but recent echocardiograms demonstrated WMA concerning for SVG failure. No LHC d/t CKD - Now on palliative inotrope, milrinone 0.125. - NYHA ***. On Torsemide 40 mg BID - GDMT limited by renal impairment.  - Has declined sleep study  2. AFL - 10/22/22 TEE/DC-CV ---> SR - 04/24 TEE/DC-CV ---> SR - Remains in NSR - On amiodarone 200 mg BID - Continue  Eliquis 5 mg bid. - He does not want to pursue sleep study.    3. CAD s/p CABG  - Hx CABG 2003 - No s/s angina - No aspirin and plavix.  On eliquis and crestor.  - Echo c/w inferior/inferolateral MI. Plan as above -> medical rx unless develops angina  4. Mild AS/moderate AI on echo - stable - follow with echo  5. CKD 3b/IV - AKI during recent admit in setting of low-output HF - labs today  6. PAD - s/p intervention 2015 Rt SFA and Rt politeal artery stents - Denies claudication.  7. HTN ***  8. DM 2 - Hgb A1c 7.0 - He is on insulin.  9. COPD - Continue home oxygen  10. Carotid artery stenosis - S/p b/l CEA - Continue statin.  11. Hyperkalemia  - Off MRA/ACEi due to recent hyperkalemia. Will keep off for now.  - he has been using a salt substitute at times. Asked him to stop this.   Follow-up: ***   Connor Hovater N, PA-C  2:52 PM

## 2023-03-10 ENCOUNTER — Telehealth (HOSPITAL_COMMUNITY): Payer: Self-pay | Admitting: Emergency Medicine

## 2023-03-10 NOTE — Telephone Encounter (Signed)
Called and spoke to Mr. Kandice Hams to introduce myself and schedule a paramedicine home visit.  He states that he has a home health nurse that comes out to assist him and that his daughter is a Engineer, civil (consulting) and she takes care of his medicines.  He asked for my name and phone number and advised he would discuss with his daughter and call me back if he feels he needs further.    Beatrix Shipper, EMT-Paramedic [phone] 03/10/2023

## 2023-03-11 ENCOUNTER — Ambulatory Visit (HOSPITAL_COMMUNITY)
Admit: 2023-03-11 | Discharge: 2023-03-11 | Disposition: A | Discharge status: Home / Self Care | Payer: Medicare HMO | Source: Ambulatory Visit | Attending: Family Medicine | Admitting: Family Medicine

## 2023-03-11 ENCOUNTER — Encounter (HOSPITAL_COMMUNITY): Payer: Self-pay

## 2023-03-11 VITALS — BP 134/60 | HR 62 | Ht 70.0 in | Wt 166.4 lb

## 2023-03-11 DIAGNOSIS — N179 Acute kidney failure, unspecified: Secondary | ICD-10-CM | POA: Diagnosis not present

## 2023-03-11 DIAGNOSIS — E875 Hyperkalemia: Secondary | ICD-10-CM | POA: Diagnosis not present

## 2023-03-11 DIAGNOSIS — N184 Chronic kidney disease, stage 4 (severe): Secondary | ICD-10-CM

## 2023-03-11 DIAGNOSIS — I509 Heart failure, unspecified: Secondary | ICD-10-CM | POA: Diagnosis not present

## 2023-03-11 DIAGNOSIS — I255 Ischemic cardiomyopathy: Secondary | ICD-10-CM | POA: Diagnosis not present

## 2023-03-11 DIAGNOSIS — I6529 Occlusion and stenosis of unspecified carotid artery: Secondary | ICD-10-CM | POA: Insufficient documentation

## 2023-03-11 DIAGNOSIS — Z951 Presence of aortocoronary bypass graft: Secondary | ICD-10-CM | POA: Diagnosis not present

## 2023-03-11 DIAGNOSIS — C61 Malignant neoplasm of prostate: Secondary | ICD-10-CM | POA: Insufficient documentation

## 2023-03-11 DIAGNOSIS — E1151 Type 2 diabetes mellitus with diabetic peripheral angiopathy without gangrene: Secondary | ICD-10-CM | POA: Insufficient documentation

## 2023-03-11 DIAGNOSIS — Z794 Long term (current) use of insulin: Secondary | ICD-10-CM | POA: Insufficient documentation

## 2023-03-11 DIAGNOSIS — E1122 Type 2 diabetes mellitus with diabetic chronic kidney disease: Secondary | ICD-10-CM | POA: Diagnosis not present

## 2023-03-11 DIAGNOSIS — I252 Old myocardial infarction: Secondary | ICD-10-CM | POA: Insufficient documentation

## 2023-03-11 DIAGNOSIS — I2510 Atherosclerotic heart disease of native coronary artery without angina pectoris: Secondary | ICD-10-CM | POA: Diagnosis not present

## 2023-03-11 DIAGNOSIS — I130 Hypertensive heart and chronic kidney disease with heart failure and stage 1 through stage 4 chronic kidney disease, or unspecified chronic kidney disease: Secondary | ICD-10-CM | POA: Diagnosis not present

## 2023-03-11 DIAGNOSIS — N1832 Chronic kidney disease, stage 3b: Secondary | ICD-10-CM | POA: Diagnosis not present

## 2023-03-11 DIAGNOSIS — J449 Chronic obstructive pulmonary disease, unspecified: Secondary | ICD-10-CM | POA: Insufficient documentation

## 2023-03-11 DIAGNOSIS — J9611 Chronic respiratory failure with hypoxia: Secondary | ICD-10-CM | POA: Diagnosis not present

## 2023-03-11 DIAGNOSIS — Z79899 Other long term (current) drug therapy: Secondary | ICD-10-CM | POA: Diagnosis not present

## 2023-03-11 DIAGNOSIS — I5042 Chronic combined systolic (congestive) and diastolic (congestive) heart failure: Secondary | ICD-10-CM | POA: Insufficient documentation

## 2023-03-11 DIAGNOSIS — I4892 Unspecified atrial flutter: Secondary | ICD-10-CM | POA: Diagnosis not present

## 2023-03-11 DIAGNOSIS — Z9981 Dependence on supplemental oxygen: Secondary | ICD-10-CM | POA: Insufficient documentation

## 2023-03-11 DIAGNOSIS — Z7901 Long term (current) use of anticoagulants: Secondary | ICD-10-CM | POA: Insufficient documentation

## 2023-03-11 DIAGNOSIS — I872 Venous insufficiency (chronic) (peripheral): Secondary | ICD-10-CM | POA: Diagnosis not present

## 2023-03-11 DIAGNOSIS — E785 Hyperlipidemia, unspecified: Secondary | ICD-10-CM | POA: Diagnosis not present

## 2023-03-11 DIAGNOSIS — N1831 Chronic kidney disease, stage 3a: Secondary | ICD-10-CM | POA: Diagnosis not present

## 2023-03-11 DIAGNOSIS — I5043 Acute on chronic combined systolic (congestive) and diastolic (congestive) heart failure: Secondary | ICD-10-CM | POA: Diagnosis not present

## 2023-03-11 LAB — COMPREHENSIVE METABOLIC PANEL
ALT: 26 U/L (ref 0–44)
AST: 27 U/L (ref 15–41)
Albumin: 3.6 g/dL (ref 3.5–5.0)
Alkaline Phosphatase: 88 U/L (ref 38–126)
Anion gap: 11 (ref 5–15)
BUN: 58 mg/dL — ABNORMAL HIGH (ref 8–23)
CO2: 29 mmol/L (ref 22–32)
Calcium: 9.6 mg/dL (ref 8.9–10.3)
Chloride: 97 mmol/L — ABNORMAL LOW (ref 98–111)
Creatinine, Ser: 2.46 mg/dL — ABNORMAL HIGH (ref 0.61–1.24)
GFR, Estimated: 27 mL/min — ABNORMAL LOW (ref >60)
Glucose, Bld: 105 mg/dL — ABNORMAL HIGH (ref 70–99)
Potassium: 4.4 mmol/L (ref 3.5–5.1)
Sodium: 137 mmol/L (ref 135–145)
Total Bilirubin: 0.5 mg/dL (ref 0.3–1.2)
Total Protein: 7.4 g/dL (ref 6.5–8.1)

## 2023-03-11 LAB — CBC
HCT: 39.7 % (ref 39.0–52.0)
Hemoglobin: 12.1 g/dL — ABNORMAL LOW (ref 13.0–17.0)
MCH: 26.5 pg (ref 26.0–34.0)
MCHC: 30.5 g/dL (ref 30.0–36.0)
MCV: 87.1 fL (ref 80.0–100.0)
Platelets: 257 10*3/uL (ref 150–400)
RBC: 4.56 MIL/uL (ref 4.22–5.81)
RDW: 18.3 % — ABNORMAL HIGH (ref 11.5–15.5)
WBC: 7.9 10*3/uL (ref 4.0–10.5)
nRBC: 0 % (ref 0.0–0.2)

## 2023-03-11 LAB — MAGNESIUM: Magnesium: 2.4 mg/dL (ref 1.7–2.4)

## 2023-03-11 MED ORDER — AMIODARONE HCL 200 MG PO TABS
200.0000 mg | ORAL_TABLET | Freq: Every day | ORAL | 3 refills | Status: DC
Start: 2023-03-11 — End: 2023-05-05

## 2023-03-11 NOTE — Patient Instructions (Signed)
Decrease amidoarone to 200 mg daily. Labs drawn today - will call you if abnormal. Return to see Dr. Gala Romney in 2 weeks - see below. Please call us at [phone] if any questions or concern.

## 2023-03-11 NOTE — Addendum Note (Signed)
Encounter addended by: Andrey Farmer, PA-C on: 03/11/2023 4:26 PM  Actions taken: Clinical Note Signed

## 2023-03-18 DIAGNOSIS — I509 Heart failure, unspecified: Secondary | ICD-10-CM | POA: Diagnosis not present

## 2023-03-18 DIAGNOSIS — E1122 Type 2 diabetes mellitus with diabetic chronic kidney disease: Secondary | ICD-10-CM | POA: Diagnosis not present

## 2023-03-18 DIAGNOSIS — J9611 Chronic respiratory failure with hypoxia: Secondary | ICD-10-CM | POA: Diagnosis not present

## 2023-03-18 DIAGNOSIS — N1831 Chronic kidney disease, stage 3a: Secondary | ICD-10-CM | POA: Diagnosis not present

## 2023-03-18 DIAGNOSIS — E1151 Type 2 diabetes mellitus with diabetic peripheral angiopathy without gangrene: Secondary | ICD-10-CM | POA: Diagnosis not present

## 2023-03-18 DIAGNOSIS — I5043 Acute on chronic combined systolic (congestive) and diastolic (congestive) heart failure: Secondary | ICD-10-CM | POA: Diagnosis not present

## 2023-03-18 DIAGNOSIS — I872 Venous insufficiency (chronic) (peripheral): Secondary | ICD-10-CM | POA: Diagnosis not present

## 2023-03-18 DIAGNOSIS — J449 Chronic obstructive pulmonary disease, unspecified: Secondary | ICD-10-CM | POA: Diagnosis not present

## 2023-03-18 DIAGNOSIS — I4892 Unspecified atrial flutter: Secondary | ICD-10-CM | POA: Diagnosis not present

## 2023-03-18 DIAGNOSIS — I2510 Atherosclerotic heart disease of native coronary artery without angina pectoris: Secondary | ICD-10-CM | POA: Diagnosis not present

## 2023-03-18 DIAGNOSIS — I130 Hypertensive heart and chronic kidney disease with heart failure and stage 1 through stage 4 chronic kidney disease, or unspecified chronic kidney disease: Secondary | ICD-10-CM | POA: Diagnosis not present

## 2023-03-19 ENCOUNTER — Encounter (HOSPITAL_COMMUNITY): Payer: Self-pay

## 2023-03-23 LAB — ECHO TEE
Est EF: 25
P 1/2 time: 310 msec

## 2023-03-25 DIAGNOSIS — N1831 Chronic kidney disease, stage 3a: Secondary | ICD-10-CM | POA: Diagnosis not present

## 2023-03-25 DIAGNOSIS — J9611 Chronic respiratory failure with hypoxia: Secondary | ICD-10-CM | POA: Diagnosis not present

## 2023-03-25 DIAGNOSIS — I872 Venous insufficiency (chronic) (peripheral): Secondary | ICD-10-CM | POA: Diagnosis not present

## 2023-03-25 DIAGNOSIS — I2510 Atherosclerotic heart disease of native coronary artery without angina pectoris: Secondary | ICD-10-CM | POA: Diagnosis not present

## 2023-03-25 DIAGNOSIS — I4892 Unspecified atrial flutter: Secondary | ICD-10-CM | POA: Diagnosis not present

## 2023-03-25 DIAGNOSIS — I509 Heart failure, unspecified: Secondary | ICD-10-CM | POA: Diagnosis not present

## 2023-03-25 DIAGNOSIS — J449 Chronic obstructive pulmonary disease, unspecified: Secondary | ICD-10-CM | POA: Diagnosis not present

## 2023-03-25 DIAGNOSIS — E1151 Type 2 diabetes mellitus with diabetic peripheral angiopathy without gangrene: Secondary | ICD-10-CM | POA: Diagnosis not present

## 2023-03-25 DIAGNOSIS — M831 Senile osteomalacia: Secondary | ICD-10-CM | POA: Diagnosis not present

## 2023-03-25 DIAGNOSIS — I130 Hypertensive heart and chronic kidney disease with heart failure and stage 1 through stage 4 chronic kidney disease, or unspecified chronic kidney disease: Secondary | ICD-10-CM | POA: Diagnosis not present

## 2023-03-25 DIAGNOSIS — E1122 Type 2 diabetes mellitus with diabetic chronic kidney disease: Secondary | ICD-10-CM | POA: Diagnosis not present

## 2023-03-25 DIAGNOSIS — I5043 Acute on chronic combined systolic (congestive) and diastolic (congestive) heart failure: Secondary | ICD-10-CM | POA: Diagnosis not present

## 2023-03-26 ENCOUNTER — Ambulatory Visit (HOSPITAL_COMMUNITY)
Admission: RE | Admit: 2023-03-26 | Discharge: 2023-03-26 | Disposition: A | Discharge status: Home / Self Care | Payer: Medicare HMO | Source: Ambulatory Visit | Attending: Internal Medicine | Admitting: Internal Medicine

## 2023-03-26 ENCOUNTER — Other Ambulatory Visit (HOSPITAL_COMMUNITY): Payer: Self-pay | Admitting: Emergency Medicine

## 2023-03-26 ENCOUNTER — Encounter (HOSPITAL_COMMUNITY): Payer: Self-pay | Admitting: Internal Medicine

## 2023-03-26 VITALS — BP 130/60 | HR 57 | Wt 169.2 lb

## 2023-03-26 DIAGNOSIS — Z79899 Other long term (current) drug therapy: Secondary | ICD-10-CM | POA: Diagnosis not present

## 2023-03-26 DIAGNOSIS — E875 Hyperkalemia: Secondary | ICD-10-CM | POA: Insufficient documentation

## 2023-03-26 DIAGNOSIS — I739 Peripheral vascular disease, unspecified: Secondary | ICD-10-CM

## 2023-03-26 DIAGNOSIS — I130 Hypertensive heart and chronic kidney disease with heart failure and stage 1 through stage 4 chronic kidney disease, or unspecified chronic kidney disease: Secondary | ICD-10-CM | POA: Diagnosis not present

## 2023-03-26 DIAGNOSIS — I6529 Occlusion and stenosis of unspecified carotid artery: Secondary | ICD-10-CM | POA: Insufficient documentation

## 2023-03-26 DIAGNOSIS — E1151 Type 2 diabetes mellitus with diabetic peripheral angiopathy without gangrene: Secondary | ICD-10-CM | POA: Insufficient documentation

## 2023-03-26 DIAGNOSIS — E1122 Type 2 diabetes mellitus with diabetic chronic kidney disease: Secondary | ICD-10-CM | POA: Insufficient documentation

## 2023-03-26 DIAGNOSIS — Z7901 Long term (current) use of anticoagulants: Secondary | ICD-10-CM | POA: Insufficient documentation

## 2023-03-26 DIAGNOSIS — Z951 Presence of aortocoronary bypass graft: Secondary | ICD-10-CM | POA: Diagnosis not present

## 2023-03-26 DIAGNOSIS — J449 Chronic obstructive pulmonary disease, unspecified: Secondary | ICD-10-CM | POA: Diagnosis not present

## 2023-03-26 DIAGNOSIS — I25118 Atherosclerotic heart disease of native coronary artery with other forms of angina pectoris: Secondary | ICD-10-CM

## 2023-03-26 DIAGNOSIS — I480 Paroxysmal atrial fibrillation: Secondary | ICD-10-CM | POA: Diagnosis not present

## 2023-03-26 DIAGNOSIS — E785 Hyperlipidemia, unspecified: Secondary | ICD-10-CM | POA: Insufficient documentation

## 2023-03-26 DIAGNOSIS — I255 Ischemic cardiomyopathy: Secondary | ICD-10-CM | POA: Diagnosis not present

## 2023-03-26 DIAGNOSIS — I2510 Atherosclerotic heart disease of native coronary artery without angina pectoris: Secondary | ICD-10-CM | POA: Diagnosis not present

## 2023-03-26 DIAGNOSIS — N184 Chronic kidney disease, stage 4 (severe): Secondary | ICD-10-CM | POA: Diagnosis not present

## 2023-03-26 DIAGNOSIS — I5042 Chronic combined systolic (congestive) and diastolic (congestive) heart failure: Secondary | ICD-10-CM | POA: Diagnosis not present

## 2023-03-26 DIAGNOSIS — Z794 Long term (current) use of insulin: Secondary | ICD-10-CM | POA: Insufficient documentation

## 2023-03-26 DIAGNOSIS — N179 Acute kidney failure, unspecified: Secondary | ICD-10-CM | POA: Insufficient documentation

## 2023-03-26 DIAGNOSIS — C61 Malignant neoplasm of prostate: Secondary | ICD-10-CM | POA: Insufficient documentation

## 2023-03-26 MED ORDER — POTASSIUM CHLORIDE CRYS ER 20 MEQ PO TBCR: 40.0000 meq | EXTENDED_RELEASE_TABLET | Freq: Every day | ORAL | 5 refills | Status: DC

## 2023-03-26 MED ORDER — TORSEMIDE 20 MG PO TABS: 40.0000 mg | ORAL_TABLET | Freq: Two times a day (BID) | ORAL | 5 refills | Status: DC

## 2023-03-26 NOTE — Patient Instructions (Signed)
EKG done today.   No Labs done today.   Potassium and torsemide have been refilled  No medication changes were made. Please continue all current medications as prescribed.  Your physician recommends that you schedule a follow-up appointment in: 2-3 months  If you have any questions or concerns before your next appointment please send [place] a message through [place] or call our office at [phone].    TO LEAVE A MESSAGE FOR THE NURSE SELECT OPTION 2, PLEASE LEAVE A MESSAGE INCLUDING: YOUR NAME DATE OF BIRTH CALL BACK NUMBER REASON FOR CALL**this is important as we prioritize the call backs  YOU WILL RECEIVE A CALL BACK THE SAME DAY AS LONG AS YOU CALL BEFORE 4:00 PM   Do the following things EVERYDAY: Weigh yourself in the morning before breakfast. Write it down and keep it in a log. Take your medicines as prescribed Eat low salt foods--Limit salt (sodium) to 2000 mg per day.  Stay as active as you can everyday Limit all fluids for the day to less than 2 liters   At the [facility], you and your health needs are our priority. As part of our continuing mission to provide you with exceptional heart care, we have created designated Provider Care Teams. These Care Teams include your primary Cardiologist (physician) and Advanced Practice Providers (APPs- Physician Assistants and Nurse Practitioners) who all work together to provide you with the care you need, when you need it.   You may see any of the following providers on your designated Care Team at your next follow up: Dr Arvilla Meres Dr Marca Ancona Dr. Marcos Eke, NP Robbie Lis, [place] [facility] [place], [place] Brynda Peon, NP Karle Plumber, PharmD   Please be sure to bring in all your medications bottles to every appointment.    Thank you for choosing [facility]

## 2023-03-26 NOTE — Progress Notes (Signed)
ADVANCED HF CLINIC NOTE  Primary Care: [name], Adelfa Koh, MD HF Cardiologist: Dr. Gala Romney Pulmonary: Dr Thora Lance  HPI: [name] is a 77 y.o. male with CAD c/p CABG 2003, chronic diastolic CHF, chronic DOE, Rt CEA >60yrs ago, Lt CEA 14', HTN, DM2, HLD, PAD w/ Rt SFA and Rt politeal artery vascular stents 15', prostate cancer.    Admitted 11/23 with acute systolic heart failure, newly reduced EF. Echo EF 30-35%, mild concentric LVH, GIIDD, RV function mildly reduced, mild MR, mild to mod TR, mild aortic valve regurg. Underwent RHC showing low filling pressures and preserved CO. GDMT started but limited by AKI. He was discharged home with oxygen, weight 172 lbs.  He was seen in the HF clinic 10/07/22. EKG showed new AFL. Started on eliquis/amio. Plan for TEE/DC-CV 10/22/22.   Had blood work with K >7.5.  Sent to ED and admitted on 10/17/22 with hyperkalemia and worsening renal function. Renal US negative for hydronephrosis. Lotrel, faxiga, eplerenone, lasix stopped.   10/22/22 S/P TEE/DC-CV--> EF 30-35% conversion to SR  Echo 01/02/23 EF 35-40% mild AS/mod AI mild MR. Seen for f/u. WMA on echo c/w inferior/inferolateral MI, suspected probable SVG failure. Given CKD and lack of angina did not pursue cath.  Admitted 03/6-03/30/24 with acute on chronic CHF. He was then redmitted 04/16-05/07/24 with acute on chronic CHF with low-output. He was significantly volume overloaded. Scr began to rise with attempts to diurese. Required addition of inotrope support. Echo EF 30%, RWMA, RV moderately reduced, RVSP severely elevated, mild MR. RHC on milrinone with stable CO and elevated filling pressures. Course c/b atrial flutter with RVR. He chemically converted with amiodarone but later had recurrence and underwent TEE guided DCCV. He later failed milrinone wean with recurrent volume overload and AKI. Milrinone restarted. He was discharged home on palliative inotrope. Not a candidate for advanced therapies.    Here for f/u with his daughter, [name], who is a Engineer, civil (consulting) and previously worked for [employer] home infusion. Remains on milrinone 0.125. Feels much better. Able to get around the house and do ADLs without a problem. Edema well controlled on torsemide 40 bid. No dizziness or palpitations. No bleeding  on Eliquis.    Cardiac Studies: RHC 04/24 on milrinone 0.25 Hemodynamics (mmHg) RA mean 12 RV 55/16 PA 57/22, mean 34 PCWP mean 24, v-waves to 35 Oxygen saturations: PA 51% AO 86% Cardiac Output (Fick) 4.71  Cardiac Index (Fick) 2.37 PVR 2.1 WU    RHC 11/23   RA = 8 RV = 48/10 PA = 45/20 (34) PCW = 13 Fick cardiac output/index = 6.0/3.0 Thermo CO/CI = 4.8/2.4  PVR = 3.3 WU (Fick) Ao sat = 90% PA sat = 55%, 58%   TEE, 02/23/23: EF 25%, RV moderately HK, severe LAE, moderate AI, mild MR, mild TR  Echo 02/16/23: EF 30%, rWMA, RV moderately reduced, severely elevated RVSP, mild MR  TEE 10/22/22 EF 30-35%    - Echo (11/23): EF 30-35%, mild concentric LVH, GIIDD, RV function mildly reduced, mild MR, mild to mod TR, mild aortic valve regurg  - Echo (9/22): EF 60-65%, normal RV function, LA mildly elevated, trivial MR/TR, mild-mod aortic valve regurg    Past Medical History:  Diagnosis Date   Acute hypoxemic respiratory failure (HCC) 01/20/2023   Acute kidney injury superimposed on chronic kidney disease (HCC) 10/19/2022   Acute on chronic systolic CHF (congestive heart failure) (HCC) 01/21/2023   Arthritis    CAD (coronary artery disease)    Carotid  artery occlusion    Diabetes mellitus    fasting 100-120s   Dizziness    Dysrhythmia    skips beats   GERD (gastroesophageal reflux disease)    History of kidney stones    Hyperlipidemia    Hypertension    IHD (ischemic heart disease)    Prior CABG in 2003   Normal nuclear stress test 03/2011   Peripheral vascular disease (HCC)    Prostate cancer (HCC)    Torn rotator cuff     Current Outpatient Medications  Medication  Sig Dispense Refill   albuterol (VENTOLIN HFA) 108 (90 Base) MCG/ACT inhaler Inhale 2 puffs into the lungs every 6 (six) hours as needed for wheezing or shortness of breath.     amiodarone (PACERONE) 200 MG tablet Take 1 tablet (200 mg total) by mouth daily. 90 tablet 3   apixaban (ELIQUIS) 5 MG TABS tablet Take 1 tablet (5 mg total) by mouth 2 (two) times daily. 60 tablet 8   Budeson-Glycopyrrol-Formoterol (BREZTRI AEROSPHERE) 160-9-4.8 MCG/ACT AERO Inhale 2 puffs into the lungs in the morning and at bedtime. 5.9 g 0   Cholecalciferol (VITAMIN D-3) 125 MCG (5000 UT) TABS Take 5,000 Units by mouth daily.     cyanocobalamin (VITAMIN B12) 1000 MCG tablet Take 1,000 mcg by mouth daily.     hydrALAZINE (APRESOLINE) 50 MG tablet Take 0.5 tablets (25 mg total) by mouth 3 (three) times daily.     insulin NPH Human (NOVOLIN N) 100 UNIT/ML injection Inject 35-40 Units into the skin See admin instructions. Inject 40 units into the skin in the morning and 35 units at night     insulin regular (NOVOLIN R) 100 units/mL injection Inject 6-15 Units into the skin 3 (three) times daily before meals. Per sliding scale     isosorbide mononitrate (IMDUR) 30 MG 24 hr tablet Take 1 tablet (30 mg total) by mouth daily. 90 tablet 3   LINZESS 72 MCG capsule Take 72 mcg by mouth every morning.     milrinone (PRIMACOR) 20 MG/100 ML SOLN infusion Inject 0.01 mg/min into the vein continuous. [facility] 200 mL 52   nitroGLYCERIN (NITROSTAT) 0.4 MG SL tablet Place 1 tablet (0.4 mg total) under the tongue every 5 (five) minutes as needed for chest pain. 25 tablet 6   pantoprazole (PROTONIX) 40 MG tablet Take 40 mg by mouth daily.     potassium chloride SA (KLOR-CON M) 20 MEQ tablet Take 2 tablets (40 mEq total) by mouth daily. 60 tablet 0   rosuvastatin (CRESTOR) 20 MG tablet Take 1 tablet by mouth once daily 90 tablet 3   torsemide (DEMADEX) 20 MG tablet Take 2 tablets (40 mg total) by mouth 2 (two) times daily. 120  tablet 0   No current facility-administered medications for this encounter.   Allergies  Allergen Reactions   Aldactone [Spironolactone] Other (See Comments)    Swollen breast   Lipitor [Atorvastatin Calcium] Other (See Comments)    Muscle ache   Codeine Rash   Sulfa Drugs Cross Reactors Other (See Comments)    "raw spots"   Sulfamethoxazole-Trimethoprim Rash    Other reaction(s): Rash   Social History   Socioeconomic History   Marital status: Married    Spouse name: Not on file   Number of children: 2   Years of education: Not on file   Highest education level: Not on file  Occupational History   Not on file  Tobacco Use   Smoking status: Former  Packs/day: 1.00    Years: 20.00    Additional pack years: 0.00    Total pack years: 20.00    Types: Cigarettes    Quit date: 10/27/2001    Years since quitting: 21.4    Passive exposure: Never   Smokeless tobacco: Never  Vaping Use   Vaping Use: Never used  Substance and Sexual Activity   Alcohol use: No    Alcohol/week: 0.0 standard drinks of alcohol   Drug use: No   Sexual activity: Not Currently  Other Topics Concern   Not on file  Social History Narrative   Not on file   Social Determinants of Health   Financial Resource Strain: Low Risk  (02/24/2023)   Overall Financial Resource Strain (CARDIA)    Difficulty of Paying Living Expenses: Not very hard  Food Insecurity: No Food Insecurity (02/24/2023)   Hunger Vital Sign    Worried About Running Out of Food in the Last Year: Never true    Ran Out of Food in the Last Year: Never true  Transportation Needs: No Transportation Needs (02/24/2023)   PRAPARE - Administrator, Civil Service (Medical): No    Lack of Transportation (Non-Medical): No  Physical Activity: Not on file  Stress: Not on file  Social Connections: Not on file  Intimate Partner Violence: Not At Risk (02/26/2023)   Humiliation, Afraid, Rape, and Kick questionnaire    Fear of Current or  Ex-Partner: No    Emotionally Abused: No    Physically Abused: No    Sexually Abused: No   Family History  Problem Relation Age of Onset   Heart attack Mother    Diabetes Mother    Heart disease Mother    Hypertension Mother    Heart attack Father    Heart disease Father    Hypertension Father    Diabetes Brother    Diabetes Sister    Diabetes Daughter    Heart disease Daughter    Hypertension Daughter    Diabetes Sister    Breast cancer Neg Hx    Prostate cancer Neg Hx    Colon cancer Neg Hx    Pancreatic cancer Neg Hx    BP 130/60   Pulse (!) 57   Wt 76.7 kg (169 lb 3.2 oz)   SpO2 99% Comment: 2l n/c  BMI 24.28 kg/m   Wt Readings from Last 3 Encounters:  03/26/23 76.7 kg (169 lb 3.2 oz)  03/11/23 75.5 kg (166 lb 6.4 oz)  03/04/23 75.2 kg (165 lb 12.8 oz)   PHYSICAL EXAM: General:  Well appearing. No resp difficulty HEENT: normal Neck: supple. no JVD. Carotids 2+ bilat; no bruits. No lymphadenopathy or thryomegaly appreciated. Cor: PMI nondisplaced. Regular rate & rhythm. 2/6 MR  PICC site ok  Lungs: clear Abdomen: soft, nontender, nondistended. No hepatosplenomegaly. No bruits or masses. Good bowel sounds. Extremities: no cyanosis, clubbing, rash, edema Neuro: alert & orientedx3, cranial nerves grossly intact. moves all 4 extremities w/o difficulty. Affect pleasant    ECG : SR with very long 1st degree vs junctional rhythm 62 bpm   ASSESSMENT & PLAN:  Chronic combined systolic and diastolic CHF, previously HFpEF now HFrEF -ischemic cardiomyopathy - Echo 9/22: EF 60-65%, normal RV function, LA mildly elevated, trivial MR/TR, mild-mod aortic valve regurg  - Echo 11/23: EF 30-35%, mild concentric LVH, GIIDD, RV function mildly reduced, mild MR, mild to mod TR, mild aortic valve regurg - RHC 11/23 with low filling pressures and preserved  cardiac output.  - Echo 01/02/23 EF 35-40%, WMA consistent with inferior/inferolateral MI, mild AS/mod AI mild MR - Admit  04/24 with low-output HF. Echo 04/24: EF 30%, RV moderately reduced, severely elevated RVSP, moderate MR. RHC with stable CO on milrinone 0.25. Failed milrinone wean. - Had initially thought reduced EF d/t AF but recent echocardiograms demonstrated WMA concerning for SVG failure. No LHC d/t CKD - Much improved with home/plliative milrinone since 4/24 at 0.125 mcg/kg/min. Now NYHA II-IIII. Will continue for now given that he failed wean in hospital x 2.  - Volume ok. Continue Torsemide 40 bid. Weight trending down overall. Check labs today. - Continue hydralazine 25 TID - Continue imdur 30 daily - GDMT limited by renal impairment.  - Continue home milrinone. His daughter was previously an IV nurse. Will d/w his [facility] service if she can manage the drip at home for him with labs 1-2x/month - not candidate for VAD due to CKD IV  2. AFL - 10/22/22 TEE/DC-CV ---> SR - 04/24 TEE/DC-CV ---> SR - In NSR. Continue amio 200 daily - Continue  Eliquis 5 mg bid. No evidence of bleeding - He does not want to pursue sleep study.    3. CAD s/p CABG  - Hx CABG 2003 - No s/s angina - No aspirin and plavix.  On eliquis and crestor.  - Echo c/w inferior/inferolateral MI. Plan as above -> medical rx   4. AI - moderate on echo 04/24 - follow with echo  5. CKD IV - AKI during recent admit in setting of low-output HF - recent labs with stable Scr at 2.4  6. PAD - s/p intervention 2015 Rt SFA and Rt politeal artery stents - Denies claudication.  7. HTN - Blood pressure well controlled. Continue current regimen.  8. DM 2 - Hgb A1c 7.0 - He is on insulin.  9. COPD - Continue home oxygen  10. Carotid artery stenosis - S/p b/l CEA - Continue statin.  11. Hyperkalemia  - Off MRA/ACEi due to recent hyperkalemia. Will keep off for now.  - Recent labs ok    Total time spent 45 minutes. Over half that time spent discussing above.   Arvilla Meres, MD  4:31 PM

## 2023-03-26 NOTE — Progress Notes (Signed)
Arrived to clinic for pt appt with Dr. Gala Romney.  Advised Dr. Gala Romney pt does not feel paramedicine is necessary as his daughter is an Charity fundraiser and handles all his needs at home. Dr. Gala Romney advised he agreed no paramedicine needed for this pt.   Will discharge from program.     Beatrix Shipper, EMT-Paramedic [phone] 03/26/2023

## 2023-03-27 ENCOUNTER — Telehealth (HOSPITAL_COMMUNITY): Payer: Self-pay

## 2023-03-27 ENCOUNTER — Encounter (HOSPITAL_COMMUNITY): Payer: Self-pay | Admitting: Internal Medicine

## 2023-03-27 DIAGNOSIS — J9601 Acute respiratory failure with hypoxia: Secondary | ICD-10-CM | POA: Diagnosis not present

## 2023-03-27 NOTE — Telephone Encounter (Signed)
Oxygen order faxed to [facility]

## 2023-03-28 DIAGNOSIS — I509 Heart failure, unspecified: Secondary | ICD-10-CM | POA: Diagnosis not present

## 2023-03-28 DIAGNOSIS — E1142 Type 2 diabetes mellitus with diabetic polyneuropathy: Secondary | ICD-10-CM | POA: Diagnosis not present

## 2023-03-31 ENCOUNTER — Telehealth: Payer: Self-pay | Admitting: *Deleted

## 2023-03-31 ENCOUNTER — Telehealth (HOSPITAL_COMMUNITY): Payer: Self-pay | Admitting: Cardiology

## 2023-03-31 NOTE — Telephone Encounter (Signed)
Received call from [facility] Director, states she has followed up on family concerns with HH. States family cancelled their services. They have followed up with [employer] rep, Pam to make aware. Pam is arranging for her team to assist family with PICC line care. Isidoro Donning RN3 CCM, Heart Failure TOC CM [phone]

## 2023-03-31 NOTE — Telephone Encounter (Signed)
ABNORMAL LABS RECEIVED LABS DRAWN 03/25/23 CR 2.27 BUN 67 K 4.9 HG 10.2  PER JESSICA MILFORD,NP HG LOWER-WATCH FOR BLEEDING, WILL FOLLOW AT REPEAT LABS    PT AWARE TO CALL FOR NOSE BLEEDS, BLEEDING GUMS, BLOODY STOOLS, BLOOD IN URINE, ETC.  SCHEDULED FOR REPEAT LABS WITH HHRN

## 2023-04-01 ENCOUNTER — Encounter: Payer: Self-pay | Admitting: Podiatry

## 2023-04-01 ENCOUNTER — Ambulatory Visit: Payer: Medicare HMO | Admitting: Podiatry

## 2023-04-01 DIAGNOSIS — I739 Peripheral vascular disease, unspecified: Secondary | ICD-10-CM | POA: Diagnosis not present

## 2023-04-01 DIAGNOSIS — B351 Tinea unguium: Secondary | ICD-10-CM

## 2023-04-01 DIAGNOSIS — M79675 Pain in left toe(s): Secondary | ICD-10-CM | POA: Diagnosis not present

## 2023-04-01 DIAGNOSIS — M79674 Pain in right toe(s): Secondary | ICD-10-CM

## 2023-04-01 DIAGNOSIS — I509 Heart failure, unspecified: Secondary | ICD-10-CM | POA: Diagnosis not present

## 2023-04-01 DIAGNOSIS — E1159 Type 2 diabetes mellitus with other circulatory complications: Secondary | ICD-10-CM | POA: Diagnosis not present

## 2023-04-01 NOTE — Progress Notes (Signed)
This patient returns to my office for at risk foot care.  This patient requires this care by a professional since this patient will be at risk due to having diabetes and PVD.  This patient is unable to cut nails himself since the patient cannot reach his nails.These nails are painful walking and wearing shoes.  This patient presents for at risk foot care today.  General Appearance  Alert, conversant and in no acute stress.  Vascular  Dorsalis pedis and posterior tibial  pulses are  weakly palpable  bilaterally.  Capillary return is within normal limits  bilaterally. Temperature is within normal limits  bilaterally.  Neurologic  Senn-Weinstein monofilament wire test within normal limits  bilaterally. Muscle power within normal limits bilaterally.  Nails Thick disfigured discolored nails with subungual debris  from hallux to fifth toes bilaterally. No evidence of bacterial infection or drainage bilaterally.  Orthopedic  No limitations of motion  feet .  No crepitus or effusions noted.  No bony pathology or digital deformities noted.  Skin  normotropic skin with no porokeratosis noted bilaterally.  No signs of infections or ulcers noted.     Onychomycosis  Pain in right toes  Pain in left toes  Consent was obtained for treatment procedures.   Mechanical debridement of nails 1-5  bilaterally performed with a nail nipper.  Filed with dremel without incident.    Return office visit  3 months                    Told patient to return for periodic foot care and evaluation due to potential at risk complications.   Helane Gunther DPM

## 2023-04-02 ENCOUNTER — Encounter (HOSPITAL_COMMUNITY): Payer: Self-pay | Admitting: Internal Medicine

## 2023-04-02 ENCOUNTER — Ambulatory Visit: Payer: Medicare HMO | Admitting: Podiatry

## 2023-04-02 DIAGNOSIS — I5042 Chronic combined systolic (congestive) and diastolic (congestive) heart failure: Secondary | ICD-10-CM

## 2023-04-06 ENCOUNTER — Other Ambulatory Visit (HOSPITAL_COMMUNITY): Payer: Self-pay | Admitting: Cardiology

## 2023-04-06 DIAGNOSIS — I5042 Chronic combined systolic (congestive) and diastolic (congestive) heart failure: Secondary | ICD-10-CM

## 2023-04-06 NOTE — Telephone Encounter (Signed)
Daughter called triage regarding increased fatigue that is off and on. See previous messages for details. Daughter concerned she had not heard from provider- will route message directly    Also requests order for labs Pts dressing changes and infusion change to be done by daughter-family has discharged home health all together Orders placed for [facility]

## 2023-04-07 DIAGNOSIS — Z794 Long term (current) use of insulin: Secondary | ICD-10-CM | POA: Diagnosis not present

## 2023-04-07 DIAGNOSIS — I5042 Chronic combined systolic (congestive) and diastolic (congestive) heart failure: Secondary | ICD-10-CM | POA: Diagnosis not present

## 2023-04-07 DIAGNOSIS — Z7984 Long term (current) use of oral hypoglycemic drugs: Secondary | ICD-10-CM | POA: Diagnosis not present

## 2023-04-07 DIAGNOSIS — I10 Essential (primary) hypertension: Secondary | ICD-10-CM | POA: Diagnosis not present

## 2023-04-07 DIAGNOSIS — I4892 Unspecified atrial flutter: Secondary | ICD-10-CM | POA: Diagnosis not present

## 2023-04-07 DIAGNOSIS — E1140 Type 2 diabetes mellitus with diabetic neuropathy, unspecified: Secondary | ICD-10-CM | POA: Diagnosis not present

## 2023-04-07 DIAGNOSIS — E785 Hyperlipidemia, unspecified: Secondary | ICD-10-CM | POA: Diagnosis not present

## 2023-04-07 DIAGNOSIS — I5032 Chronic diastolic (congestive) heart failure: Secondary | ICD-10-CM | POA: Diagnosis not present

## 2023-04-08 DIAGNOSIS — I509 Heart failure, unspecified: Secondary | ICD-10-CM | POA: Diagnosis not present

## 2023-04-08 LAB — CBC
Hematocrit: 34.5 % — ABNORMAL LOW (ref 37.5–51.0)
Hemoglobin: 10.6 g/dL — ABNORMAL LOW (ref 13.0–17.7)
MCH: 27.2 pg (ref 26.6–33.0)
MCHC: 30.7 g/dL — ABNORMAL LOW (ref 31.5–35.7)
MCV: 89 fL (ref 79–97)
Platelets: 228 10*3/uL (ref 150–450)
RBC: 3.89 x10E6/uL — ABNORMAL LOW (ref 4.14–5.80)
RDW: 16.5 % — ABNORMAL HIGH (ref 11.6–15.4)
WBC: 6 10*3/uL (ref 3.4–10.8)

## 2023-04-08 LAB — MAGNESIUM: Magnesium: 2.5 mg/dL — ABNORMAL HIGH (ref 1.6–2.3)

## 2023-04-09 LAB — BASIC METABOLIC PANEL
Chloride: 100 mmol/L (ref 96–106)
Creatinine, Ser: 2.04 mg/dL — ABNORMAL HIGH (ref 0.76–1.27)
eGFR: 33 mL/min/{1.73_m2} — ABNORMAL LOW (ref >59)

## 2023-04-10 ENCOUNTER — Telehealth (HOSPITAL_COMMUNITY): Payer: Self-pay | Admitting: Cardiology

## 2023-04-10 DIAGNOSIS — I5042 Chronic combined systolic (congestive) and diastolic (congestive) heart failure: Secondary | ICD-10-CM

## 2023-04-10 NOTE — Telephone Encounter (Signed)
Per Dr Gala Romney Place palliative referral  Order placed

## 2023-04-14 LAB — BASIC METABOLIC PANEL
BUN/Creatinine Ratio: 31 — ABNORMAL HIGH (ref 10–24)
BUN: 63 mg/dL — ABNORMAL HIGH (ref 8–27)
CO2: 20 mmol/L (ref 20–29)
Calcium: 9.2 mg/dL (ref 8.6–10.2)
Glucose: 103 mg/dL — ABNORMAL HIGH (ref 70–99)
Potassium: 4.1 mmol/L (ref 3.5–5.2)
Sodium: 138 mmol/L (ref 134–144)

## 2023-04-14 LAB — SPECIMEN STATUS REPORT

## 2023-04-15 DIAGNOSIS — I509 Heart failure, unspecified: Secondary | ICD-10-CM | POA: Diagnosis not present

## 2023-04-16 ENCOUNTER — Telehealth: Payer: Self-pay

## 2023-04-16 NOTE — Telephone Encounter (Signed)
(  2:45 pm) PC SW left a message for patient's daughter-[name] requesting a call back regarding the palliative care referral.  (2: 52 pm) Patient's daughter returned the call to SW. She declined services for patient at this time, noting patient did not need palliative care at this time.  Patient moved to inactive list.

## 2023-04-22 DIAGNOSIS — I509 Heart failure, unspecified: Secondary | ICD-10-CM | POA: Diagnosis not present

## 2023-04-22 DIAGNOSIS — I5042 Chronic combined systolic (congestive) and diastolic (congestive) heart failure: Secondary | ICD-10-CM | POA: Diagnosis not present

## 2023-04-23 LAB — BASIC METABOLIC PANEL
BUN/Creatinine Ratio: 29 — ABNORMAL HIGH (ref 10–24)
BUN: 63 mg/dL — ABNORMAL HIGH (ref 8–27)
CO2: 20 mmol/L (ref 20–29)
Calcium: 8.9 mg/dL (ref 8.6–10.2)
Chloride: 101 mmol/L (ref 96–106)
Creatinine, Ser: 2.21 mg/dL — ABNORMAL HIGH (ref 0.76–1.27)
Glucose: 116 mg/dL — ABNORMAL HIGH (ref 70–99)
Potassium: 3.8 mmol/L (ref 3.5–5.2)
Sodium: 138 mmol/L (ref 134–144)
eGFR: 30 mL/min/{1.73_m2} — ABNORMAL LOW (ref >59)

## 2023-04-26 DIAGNOSIS — J9601 Acute respiratory failure with hypoxia: Secondary | ICD-10-CM | POA: Diagnosis not present

## 2023-04-27 DIAGNOSIS — E1142 Type 2 diabetes mellitus with diabetic polyneuropathy: Secondary | ICD-10-CM | POA: Diagnosis not present

## 2023-04-27 DIAGNOSIS — I509 Heart failure, unspecified: Secondary | ICD-10-CM | POA: Diagnosis not present

## 2023-04-28 ENCOUNTER — Other Ambulatory Visit (HOSPITAL_COMMUNITY): Payer: Self-pay | Admitting: Internal Medicine

## 2023-04-29 DIAGNOSIS — I509 Heart failure, unspecified: Secondary | ICD-10-CM | POA: Diagnosis not present

## 2023-04-30 ENCOUNTER — Other Ambulatory Visit: Payer: Self-pay

## 2023-04-30 ENCOUNTER — Encounter (HOSPITAL_COMMUNITY)
Admission: EM | Disposition: A | Discharge status: Hospice | Payer: Self-pay | Source: Home / Self Care | Attending: Internal Medicine

## 2023-04-30 ENCOUNTER — Encounter (HOSPITAL_COMMUNITY): Payer: Self-pay

## 2023-04-30 ENCOUNTER — Emergency Department (HOSPITAL_COMMUNITY): Payer: Medicare HMO

## 2023-04-30 ENCOUNTER — Inpatient Hospital Stay (HOSPITAL_COMMUNITY)
Admission: EM | Admit: 2023-04-30 | Discharge: 2023-04-30 | DRG: 280 | Disposition: A | Discharge status: Hospice | Payer: Medicare HMO | Attending: Internal Medicine | Admitting: Internal Medicine

## 2023-04-30 DIAGNOSIS — Z888 Allergy status to other drugs, medicaments and biological substances status: Secondary | ICD-10-CM

## 2023-04-30 DIAGNOSIS — Z7189 Other specified counseling: Secondary | ICD-10-CM | POA: Diagnosis not present

## 2023-04-30 DIAGNOSIS — Z882 Allergy status to sulfonamides status: Secondary | ICD-10-CM

## 2023-04-30 DIAGNOSIS — I2510 Atherosclerotic heart disease of native coronary artery without angina pectoris: Secondary | ICD-10-CM | POA: Diagnosis present

## 2023-04-30 DIAGNOSIS — E1122 Type 2 diabetes mellitus with diabetic chronic kidney disease: Secondary | ICD-10-CM | POA: Diagnosis present

## 2023-04-30 DIAGNOSIS — E785 Hyperlipidemia, unspecified: Secondary | ICD-10-CM | POA: Diagnosis present

## 2023-04-30 DIAGNOSIS — I5043 Acute on chronic combined systolic (congestive) and diastolic (congestive) heart failure: Secondary | ICD-10-CM | POA: Diagnosis not present

## 2023-04-30 DIAGNOSIS — I255 Ischemic cardiomyopathy: Secondary | ICD-10-CM | POA: Diagnosis present

## 2023-04-30 DIAGNOSIS — I213 ST elevation (STEMI) myocardial infarction of unspecified site: Secondary | ICD-10-CM

## 2023-04-30 DIAGNOSIS — I499 Cardiac arrhythmia, unspecified: Secondary | ICD-10-CM | POA: Diagnosis not present

## 2023-04-30 DIAGNOSIS — Z881 Allergy status to other antibiotic agents status: Secondary | ICD-10-CM

## 2023-04-30 DIAGNOSIS — I6782 Cerebral ischemia: Secondary | ICD-10-CM | POA: Diagnosis not present

## 2023-04-30 DIAGNOSIS — N179 Acute kidney failure, unspecified: Secondary | ICD-10-CM | POA: Diagnosis not present

## 2023-04-30 DIAGNOSIS — I517 Cardiomegaly: Secondary | ICD-10-CM | POA: Diagnosis not present

## 2023-04-30 DIAGNOSIS — Z8546 Personal history of malignant neoplasm of prostate: Secondary | ICD-10-CM | POA: Diagnosis not present

## 2023-04-30 DIAGNOSIS — R9431 Abnormal electrocardiogram [ECG] [EKG]: Secondary | ICD-10-CM | POA: Diagnosis not present

## 2023-04-30 DIAGNOSIS — J449 Chronic obstructive pulmonary disease, unspecified: Secondary | ICD-10-CM | POA: Diagnosis present

## 2023-04-30 DIAGNOSIS — Z87891 Personal history of nicotine dependence: Secondary | ICD-10-CM | POA: Diagnosis not present

## 2023-04-30 DIAGNOSIS — I2119 ST elevation (STEMI) myocardial infarction involving other coronary artery of inferior wall: Principal | ICD-10-CM | POA: Diagnosis present

## 2023-04-30 DIAGNOSIS — N1832 Chronic kidney disease, stage 3b: Secondary | ICD-10-CM | POA: Diagnosis present

## 2023-04-30 DIAGNOSIS — Z7401 Bed confinement status: Secondary | ICD-10-CM | POA: Diagnosis not present

## 2023-04-30 DIAGNOSIS — Z8249 Family history of ischemic heart disease and other diseases of the circulatory system: Secondary | ICD-10-CM

## 2023-04-30 DIAGNOSIS — J9621 Acute and chronic respiratory failure with hypoxia: Secondary | ICD-10-CM | POA: Diagnosis present

## 2023-04-30 DIAGNOSIS — Z833 Family history of diabetes mellitus: Secondary | ICD-10-CM

## 2023-04-30 DIAGNOSIS — R0602 Shortness of breath: Secondary | ICD-10-CM | POA: Diagnosis not present

## 2023-04-30 DIAGNOSIS — I4891 Unspecified atrial fibrillation: Secondary | ICD-10-CM | POA: Diagnosis present

## 2023-04-30 DIAGNOSIS — I5084 End stage heart failure: Secondary | ICD-10-CM | POA: Diagnosis present

## 2023-04-30 DIAGNOSIS — E874 Mixed disorder of acid-base balance: Secondary | ICD-10-CM | POA: Diagnosis present

## 2023-04-30 DIAGNOSIS — Z515 Encounter for palliative care: Secondary | ICD-10-CM

## 2023-04-30 DIAGNOSIS — R0681 Apnea, not elsewhere classified: Secondary | ICD-10-CM | POA: Diagnosis not present

## 2023-04-30 DIAGNOSIS — Z885 Allergy status to narcotic agent status: Secondary | ICD-10-CM

## 2023-04-30 DIAGNOSIS — I472 Ventricular tachycardia, unspecified: Secondary | ICD-10-CM | POA: Diagnosis present

## 2023-04-30 DIAGNOSIS — I5021 Acute systolic (congestive) heart failure: Secondary | ICD-10-CM

## 2023-04-30 DIAGNOSIS — R55 Syncope and collapse: Secondary | ICD-10-CM | POA: Diagnosis not present

## 2023-04-30 DIAGNOSIS — Z79899 Other long term (current) drug therapy: Secondary | ICD-10-CM

## 2023-04-30 DIAGNOSIS — Z452 Encounter for adjustment and management of vascular access device: Secondary | ICD-10-CM | POA: Diagnosis not present

## 2023-04-30 DIAGNOSIS — R110 Nausea: Secondary | ICD-10-CM | POA: Diagnosis not present

## 2023-04-30 DIAGNOSIS — I509 Heart failure, unspecified: Secondary | ICD-10-CM | POA: Diagnosis not present

## 2023-04-30 DIAGNOSIS — Z7901 Long term (current) use of anticoagulants: Secondary | ICD-10-CM

## 2023-04-30 DIAGNOSIS — E1151 Type 2 diabetes mellitus with diabetic peripheral angiopathy without gangrene: Secondary | ICD-10-CM | POA: Diagnosis present

## 2023-04-30 DIAGNOSIS — Z789 Other specified health status: Secondary | ICD-10-CM

## 2023-04-30 DIAGNOSIS — Z7951 Long term (current) use of inhaled steroids: Secondary | ICD-10-CM

## 2023-04-30 DIAGNOSIS — Z66 Do not resuscitate: Secondary | ICD-10-CM | POA: Diagnosis not present

## 2023-04-30 DIAGNOSIS — I130 Hypertensive heart and chronic kidney disease with heart failure and stage 1 through stage 4 chronic kidney disease, or unspecified chronic kidney disease: Secondary | ICD-10-CM | POA: Diagnosis not present

## 2023-04-30 DIAGNOSIS — K219 Gastro-esophageal reflux disease without esophagitis: Secondary | ICD-10-CM | POA: Diagnosis present

## 2023-04-30 DIAGNOSIS — Z794 Long term (current) use of insulin: Secondary | ICD-10-CM

## 2023-04-30 DIAGNOSIS — Z951 Presence of aortocoronary bypass graft: Secondary | ICD-10-CM | POA: Diagnosis not present

## 2023-04-30 DIAGNOSIS — I2481 Acute coronary microvascular dysfunction: Secondary | ICD-10-CM | POA: Diagnosis not present

## 2023-04-30 DIAGNOSIS — Z743 Need for continuous supervision: Secondary | ICD-10-CM | POA: Diagnosis not present

## 2023-04-30 DIAGNOSIS — Z8673 Personal history of transient ischemic attack (TIA), and cerebral infarction without residual deficits: Secondary | ICD-10-CM

## 2023-04-30 DIAGNOSIS — R570 Cardiogenic shock: Secondary | ICD-10-CM | POA: Diagnosis present

## 2023-04-30 DIAGNOSIS — S0990XA Unspecified injury of head, initial encounter: Secondary | ICD-10-CM | POA: Diagnosis not present

## 2023-04-30 DIAGNOSIS — Z9981 Dependence on supplemental oxygen: Secondary | ICD-10-CM

## 2023-04-30 LAB — LACTIC ACID, PLASMA
Lactic Acid, Venous: 6.4 mmol/L (ref 0.5–1.9)
Lactic Acid, Venous: 5.5 mmol/L (ref 0.5–1.9)

## 2023-04-30 LAB — I-STAT CHEM 8, ED
BUN: 72 mg/dL — ABNORMAL HIGH (ref 8–23)
Calcium, Ion: 1.12 mmol/L — ABNORMAL LOW (ref 1.15–1.40)
Chloride: 102 mmol/L (ref 98–111)
Creatinine, Ser: 3.4 mg/dL — ABNORMAL HIGH (ref 0.61–1.24)
Glucose, Bld: 145 mg/dL — ABNORMAL HIGH (ref 70–99)
HCT: 42 % (ref 39.0–52.0)
Hemoglobin: 14.3 g/dL (ref 13.0–17.0)
Potassium: 4.8 mmol/L (ref 3.5–5.1)
Sodium: 132 mmol/L — ABNORMAL LOW (ref 135–145)
TCO2: 16 mmol/L — ABNORMAL LOW (ref 22–32)

## 2023-04-30 LAB — COMPREHENSIVE METABOLIC PANEL
ALT: 57 U/L — ABNORMAL HIGH (ref 0–44)
AST: 87 U/L — ABNORMAL HIGH (ref 15–41)
Albumin: 3.6 g/dL (ref 3.5–5.0)
Alkaline Phosphatase: 102 U/L (ref 38–126)
Anion gap: 20 — ABNORMAL HIGH (ref 5–15)
BUN: 73 mg/dL — ABNORMAL HIGH (ref 8–23)
CO2: 13 mmol/L — ABNORMAL LOW (ref 22–32)
Calcium: 9.7 mg/dL (ref 8.9–10.3)
Chloride: 97 mmol/L — ABNORMAL LOW (ref 98–111)
Creatinine, Ser: 3.32 mg/dL — ABNORMAL HIGH (ref 0.61–1.24)
GFR, Estimated: 19 mL/min — ABNORMAL LOW (ref >60)
Glucose, Bld: 149 mg/dL — ABNORMAL HIGH (ref 70–99)
Potassium: 4.9 mmol/L (ref 3.5–5.1)
Sodium: 130 mmol/L — ABNORMAL LOW (ref 135–145)
Total Bilirubin: 1 mg/dL (ref 0.3–1.2)
Total Protein: 8 g/dL (ref 6.5–8.1)

## 2023-04-30 LAB — CBC WITH DIFFERENTIAL/PLATELET
Abs Immature Granulocytes: 0.04 10*3/uL (ref 0.00–0.07)
Basophils Absolute: 0 10*3/uL (ref 0.0–0.1)
Basophils Relative: 0 %
Eosinophils Absolute: 0 10*3/uL (ref 0.0–0.5)
Eosinophils Relative: 0 %
HCT: 38.8 % — ABNORMAL LOW (ref 39.0–52.0)
Hemoglobin: 11.5 g/dL — ABNORMAL LOW (ref 13.0–17.0)
Immature Granulocytes: 0 %
Lymphocytes Relative: 8 %
Lymphs Abs: 0.8 10*3/uL (ref 0.7–4.0)
MCH: 25.8 pg — ABNORMAL LOW (ref 26.0–34.0)
MCHC: 29.6 g/dL — ABNORMAL LOW (ref 30.0–36.0)
MCV: 87 fL (ref 80.0–100.0)
Monocytes Absolute: 0.8 10*3/uL (ref 0.1–1.0)
Monocytes Relative: 7 %
Neutro Abs: 9.1 10*3/uL — ABNORMAL HIGH (ref 1.7–7.7)
Neutrophils Relative %: 85 %
Platelets: 318 10*3/uL (ref 150–400)
RBC: 4.46 MIL/uL (ref 4.22–5.81)
RDW: 17.6 % — ABNORMAL HIGH (ref 11.5–15.5)
WBC: 10.8 10*3/uL — ABNORMAL HIGH (ref 4.0–10.5)
nRBC: 0 % (ref 0.0–0.2)

## 2023-04-30 LAB — I-STAT VENOUS BLOOD GAS, ED
Acid-base deficit: 12 mmol/L — ABNORMAL HIGH (ref 0.0–2.0)
Bicarbonate: 14.5 mmol/L — ABNORMAL LOW (ref 20.0–28.0)
Calcium, Ion: 1.13 mmol/L — ABNORMAL LOW (ref 1.15–1.40)
HCT: 40 % (ref 39.0–52.0)
Hemoglobin: 13.6 g/dL (ref 13.0–17.0)
O2 Saturation: 44 %
Potassium: 4.8 mmol/L (ref 3.5–5.1)
Sodium: 132 mmol/L — ABNORMAL LOW (ref 135–145)
TCO2: 16 mmol/L — ABNORMAL LOW (ref 22–32)
pCO2, Ven: 33.9 mmHg — ABNORMAL LOW (ref 44–60)
pH, Ven: 7.241 — ABNORMAL LOW (ref 7.25–7.43)
pO2, Ven: 28 mmHg — CL (ref 32–45)

## 2023-04-30 LAB — LIPID PANEL
Cholesterol: 148 mg/dL (ref 0–200)
HDL: 65 mg/dL (ref >40)
LDL Cholesterol: 72 mg/dL (ref 0–99)
Total CHOL/HDL Ratio: 2.3 RATIO
Triglycerides: 57 mg/dL (ref <150)
VLDL: 11 mg/dL (ref 0–40)

## 2023-04-30 LAB — I-STAT CG4 LACTIC ACID, ED: Lactic Acid, Venous: 7 mmol/L (ref 0.5–1.9)

## 2023-04-30 LAB — HEMOGLOBIN A1C
Hgb A1c MFr Bld: 7.4 % — ABNORMAL HIGH (ref 4.8–5.6)
Mean Plasma Glucose: 165.68 mg/dL

## 2023-04-30 LAB — CK: Total CK: 251 U/L (ref 49–397)

## 2023-04-30 LAB — CBG MONITORING, ED
Glucose-Capillary: 145 mg/dL — ABNORMAL HIGH (ref 70–99)
Glucose-Capillary: 218 mg/dL — ABNORMAL HIGH (ref 70–99)

## 2023-04-30 LAB — APTT: aPTT: 42 seconds — ABNORMAL HIGH (ref 24–36)

## 2023-04-30 LAB — TROPONIN I (HIGH SENSITIVITY)
Troponin I (High Sensitivity): 7551 ng/L (ref <18)
Troponin I (High Sensitivity): 5269 ng/L (ref <18)

## 2023-04-30 LAB — PROTIME-INR
INR: 2.7 — ABNORMAL HIGH (ref 0.8–1.2)
Prothrombin Time: 28.6 seconds — ABNORMAL HIGH (ref 11.4–15.2)

## 2023-04-30 LAB — BRAIN NATRIURETIC PEPTIDE: B Natriuretic Peptide: 4447.1 pg/mL — ABNORMAL HIGH (ref 0.0–100.0)

## 2023-04-30 SURGERY — CORONARY/GRAFT ACUTE MI REVASCULARIZATION
Anesthesia: LOCAL

## 2023-04-30 MED ORDER — ASPIRIN 81 MG PO TBEC
81.0000 mg | DELAYED_RELEASE_TABLET | Freq: Every day | ORAL | Status: DC
  Filled 2023-04-30: qty 1

## 2023-04-30 MED ORDER — NITROGLYCERIN 0.4 MG SL SUBL: 0.4000 mg | SUBLINGUAL_TABLET | SUBLINGUAL | Status: DC | PRN

## 2023-04-30 MED ORDER — VITAMIN B-12 1000 MCG PO TABS
1000.0000 ug | ORAL_TABLET | Freq: Every day | ORAL | Status: DC
  Administered 2023-04-30: 1000 ug via ORAL
  Filled 2023-04-30: qty 1

## 2023-04-30 MED ORDER — ONDANSETRON HCL 4 MG PO TABS: 4.0000 mg | ORAL_TABLET | Freq: Three times a day (TID) | ORAL | 0 refills | Status: DC | PRN

## 2023-04-30 MED ORDER — BUDESON-GLYCOPYRROL-FORMOTEROL 160-9-4.8 MCG/ACT IN AERO: 2.0000 | INHALATION_SPRAY | Freq: Two times a day (BID) | RESPIRATORY_TRACT | Status: DC

## 2023-04-30 MED ORDER — LIDOCAINE HCL (PF) 1 % IJ SOLN
INTRAMUSCULAR | Status: AC
  Filled 2023-04-30: qty 30

## 2023-04-30 MED ORDER — SODIUM CHLORIDE 0.9% FLUSH: 3.0000 mL | Freq: Two times a day (BID) | INTRAVENOUS | Status: DC

## 2023-04-30 MED ORDER — HYDROMORPHONE HCL 2 MG PO TABS: 2.0000 mg | ORAL_TABLET | Freq: Two times a day (BID) | ORAL | 0 refills | Status: DC | PRN

## 2023-04-30 MED ORDER — ALBUTEROL SULFATE (2.5 MG/3ML) 0.083% IN NEBU: 2.5000 mg | INHALATION_SOLUTION | RESPIRATORY_TRACT | Status: DC | PRN

## 2023-04-30 MED ORDER — SODIUM CHLORIDE 0.9% FLUSH: 3.0000 mL | INTRAVENOUS | Status: DC | PRN

## 2023-04-30 MED ORDER — ACETAMINOPHEN 650 MG RE SUPP: 650.0000 mg | Freq: Four times a day (QID) | RECTAL | Status: DC | PRN

## 2023-04-30 MED ORDER — LORAZEPAM 1 MG PO TABS: 0.5000 mg | ORAL_TABLET | Freq: Four times a day (QID) | ORAL | 0 refills | Status: DC | PRN

## 2023-04-30 MED ORDER — ASPIRIN 81 MG PO CHEW: 324.0000 mg | CHEWABLE_TABLET | Freq: Once | ORAL | Status: DC

## 2023-04-30 MED ORDER — SODIUM CHLORIDE 0.9 % IV SOLN: 250.0000 mL | INTRAVENOUS | Status: DC | PRN

## 2023-04-30 MED ORDER — POTASSIUM CHLORIDE CRYS ER 20 MEQ PO TBCR
40.0000 meq | EXTENDED_RELEASE_TABLET | Freq: Every day | ORAL | Status: DC
  Administered 2023-04-30: 40 meq via ORAL
  Filled 2023-04-30: qty 2

## 2023-04-30 MED ORDER — INSULIN ASPART 100 UNIT/ML IJ SOLN: 0.0000 [IU] | Freq: Three times a day (TID) | INTRAMUSCULAR | Status: DC

## 2023-04-30 MED ORDER — AMIODARONE HCL IN DEXTROSE 360-4.14 MG/200ML-% IV SOLN
60.0000 mg/h | INTRAVENOUS | Status: AC
  Administered 2023-04-30: 60 mg/h via INTRAVENOUS
  Filled 2023-04-30: qty 200

## 2023-04-30 MED ORDER — MELATONIN 5 MG PO TABS: 10.0000 mg | ORAL_TABLET | Freq: Every day | ORAL | Status: DC

## 2023-04-30 MED ORDER — HYDROMORPHONE HCL 1 MG/ML IJ SOLN: 0.5000 mg | INTRAMUSCULAR | Status: DC | PRN

## 2023-04-30 MED ORDER — ACETAMINOPHEN 325 MG PO TABS: 650.0000 mg | ORAL_TABLET | Freq: Four times a day (QID) | ORAL | Status: DC | PRN

## 2023-04-30 MED ORDER — HEPARIN (PORCINE) 25000 UT/250ML-% IV SOLN
1000.0000 [IU]/h | INTRAVENOUS | Status: DC
  Filled 2023-04-30: qty 250

## 2023-04-30 MED ORDER — VITAMIN D 25 MCG (1000 UNIT) PO TABS
5000.0000 [IU] | ORAL_TABLET | Freq: Every day | ORAL | Status: DC
  Filled 2023-04-30: qty 5

## 2023-04-30 MED ORDER — MILRINONE LACTATE IN DEXTROSE 20-5 MG/100ML-% IV SOLN
0.1250 ug/kg/min | INTRAVENOUS | Status: DC
  Filled 2023-04-30: qty 100

## 2023-04-30 MED ORDER — FUROSEMIDE 10 MG/ML IJ SOLN
80.0000 mg | Freq: Once | INTRAMUSCULAR | Status: AC
  Administered 2023-04-30: 80 mg via INTRAVENOUS
  Filled 2023-04-30: qty 8

## 2023-04-30 MED ORDER — FUROSEMIDE 10 MG/ML IJ SOLN
160.0000 mg | Freq: Once | INTRAVENOUS | Status: AC
  Administered 2023-04-30: 160 mg via INTRAVENOUS
  Filled 2023-04-30: qty 10

## 2023-04-30 MED ORDER — AMIODARONE HCL IN DEXTROSE 360-4.14 MG/200ML-% IV SOLN
30.0000 mg/h | INTRAVENOUS | Status: DC
  Administered 2023-04-30: 30 mg/h via INTRAVENOUS
  Filled 2023-04-30: qty 200

## 2023-04-30 MED ORDER — FUROSEMIDE 10 MG/ML IJ SOLN
120.0000 mg | Freq: Once | INTRAVENOUS | Status: DC
  Filled 2023-04-30: qty 12

## 2023-04-30 MED ORDER — SODIUM CHLORIDE 0.9 % IV SOLN: INTRAVENOUS | Status: DC

## 2023-04-30 MED ORDER — LORAZEPAM 2 MG/ML PO CONC: 1.0000 mg | Freq: Four times a day (QID) | ORAL | 0 refills | Status: DC | PRN

## 2023-04-30 MED ORDER — HEPARIN SODIUM (PORCINE) 5000 UNIT/ML IJ SOLN
4000.0000 [IU] | Freq: Once | INTRAMUSCULAR | Status: AC
  Administered 2023-04-30: 4000 [IU] via INTRAVENOUS
  Filled 2023-04-30: qty 1

## 2023-04-30 MED ORDER — ONDANSETRON HCL 4 MG/2ML IJ SOLN
4.0000 mg | Freq: Four times a day (QID) | INTRAMUSCULAR | Status: DC | PRN
  Administered 2023-04-30: 4 mg via INTRAVENOUS
  Filled 2023-04-30: qty 2

## 2023-04-30 MED ORDER — ONDANSETRON HCL 4 MG/2ML IJ SOLN: 4.0000 mg | Freq: Once | INTRAMUSCULAR | Status: DC

## 2023-04-30 MED ORDER — PANTOPRAZOLE SODIUM 40 MG PO TBEC
40.0000 mg | DELAYED_RELEASE_TABLET | Freq: Every day | ORAL | Status: DC
  Administered 2023-04-30: 40 mg via ORAL
  Filled 2023-04-30: qty 1

## 2023-04-30 MED ORDER — VERAPAMIL HCL 2.5 MG/ML IV SOLN
INTRAVENOUS | Status: AC
  Filled 2023-04-30: qty 2

## 2023-04-30 MED ORDER — HEPARIN SODIUM (PORCINE) 1000 UNIT/ML IJ SOLN
INTRAMUSCULAR | Status: AC
  Filled 2023-04-30: qty 10

## 2023-04-30 MED ORDER — MILRINONE LACTATE IN DEXTROSE 20-5 MG/100ML-% IV SOLN: 0.2500 ug/kg/min | INTRAVENOUS | Status: DC

## 2023-04-30 MED ORDER — AMIODARONE IV BOLUS ONLY 150 MG/100ML
150.0000 mg | Freq: Once | INTRAVENOUS | Status: AC
  Administered 2023-04-30: 150 mg via INTRAVENOUS
  Filled 2023-04-30: qty 100

## 2023-05-28 NOTE — Consult Note (Signed)
Consultation Note Date: 05/02/2023   Patient Name: [name]  DOB: 1946-05-22  MRN: [id number]  Age / Sex: 77 y.o., male  PCP: [name], Adelfa Koh, MD Referring Physician: Maretta Bees, MD  Reason for Consultation: Establishing goals of care  HPI/Patient Profile: 77 y.o. male  with past medical history of of CAD s/p CABG in 2020, end-stage HFrEF on palliative milrinone infusion at home, CKD stage IIIb presented to the ED on 05/16/2023 from home after experiencing two syncopal episodes. Patient wsa admitted on 05/01/2023 with acute decompensated heart failure, demand ischemia, atrial fibrillation with RVR, syncopal episodes, AKI on CKD, inferior STEMI. Cardiology and Heart Failure team were consulted: patient is at end stages of heart failure now failing palliative home milrinone; no other options to improve his situation, not cath candidate. After discussions with Dr. Jerral Ralph, patient has opted for discharge home with hospice.  Clinical Assessment and Goals of Care: I have reviewed medical records including EPIC notes, labs, and imaging. Received report from primary RN - no acute concerns. Noted patient is pending transfer to inpatient bed; however, will see if discharging from ED is a reasonable option pending hospice admission.   Per TOC notes, patient has chosen to work with [facility].  Discussed case with hospice liaison, TOC, Dr. Jerral Ralph - hospice staff will be able to see and admit patient tonight 7/4. Hospice liaison has ordered STAT DME delivery for hospital bed, which can be delivered within 4 hours even on holidays.    Went to visit patient at bedside - no family/visitors present. Patient was sitting on edge of bed awake, alert, oriented, and able to participate in conversation. No signs or non-verbal gestures of pain or discomfort noted. No respiratory distress, increased work of breathing, or  secretions noted. Mild conversational dyspnea noted. Patient tells me he "doesn't feel well, but feels much better than when I got here." His main concern is his shortness of breath, though he reports it is well managed at this time.   Met with patient  to discuss diagnosis, prognosis, GOC, EOL wishes, disposition, and options.  I introduced Palliative Medicine as specialized medical care for people living with serious illness. It focuses on providing relief from the symptoms and stress of a serious illness. The goal is to improve quality of life for both the patient and the family.  We discussed a brief life review of the patient as well as functional and nutritional status. Patient has been married for 57 years - they have two children - one daughter and one son. Daughter/[name] lives close to patient and his wife and is an Charity fundraiser. His son lives in [place] and is flying down today to see him.   Emotional support provided to patient. Patient confirms goal is to focus on his comfort/quality - he confirms goal is for discharge home with hospice. He does not wish for aggressive inpatient/ED work up. Provided updates that he should be able to discharge home with hospice this afternoon  - he is happy to hear this. Allowed space and time for patient to reflect on his thoughts/feelings regarding current situation - he tells me "I am not scared or anxious. I am just ready ... this is not living, it's just existing."  Visit also consisted of discussions dealing with the complex and emotionally intense issues of symptom management and palliative care in the setting of serious and potentially life-threatening illness.  Chaplain support offered - patient declined. He has the support of "  a good pastor." Therapeutic listening provided as patient tells me about his wife, children, and grandchildren.   Patient requests I call and speak with his daughter to provide updates. Patient expresses appreciation for PMT  assistance.  Questions and concerns were addressed. The patient/family was encouraged to call with questions and/or concerns. PMT card was provided.   10:36 AM Called daughter/[name] - emotional support provided. She confirms DME is scheduled for delivery and that family can support patient after discharge at home with hospice. Discussed comfort medications that will be prescribed at discharge - PRN dilaudid and ativan. She is comfortable with the medications.   Questions and concerns were addressed. The patient/family was encouraged to call with questions and/or concerns.   Discussed with both patient/family the importance of continued conversation with each other and the medical providers regarding overall plan of care and treatment options, ensuring decisions are within the context of the patient's values and GOCs.    Primary Decision Maker: PATIENT    SUMMARY OF RECOMMENDATIONS   Now full comfort measures Continue DNR/DNI as previously documented - durable DNR form completed and placed at bedside in the ED. Copy was made and will be scanned into [facility]/ACP tab Goal is for discharge home with hospice today 7/4 after DME delivery - TOC and hospice liaison aware Should be able to discharge from ED, likely does not need transfer to inpatient unit  PMT will continue to follow peripherally. If there are any imminent needs please call the service directly  Symptom Management Dilaudid PRN pain/dyspnea/increased work of breathing/RR>25 Tylenol PRN pain/fever Continue amiodarone infusion until discharge and lasix IV once - management per cardiology Milrinone to be discontinued after he gets back home Albuterol PRN wheezing    Code Status/Advance Care Planning: DNR  Palliative Prophylaxis:  Aspiration, Delirium Protocol, Frequent Pain Assessment, Oral Care, and Turn Reposition  Additional Recommendations (Limitations, Scope, Preferences): Full Comfort Care  Psycho-social/Spiritual:   Desire for further Chaplaincy support:no Created space and opportunity for patient and family to express thoughts and feelings regarding patient's current medical situation.  Emotional support and therapeutic listening provided.  Prognosis:  Hours - Days - likely <24 hours after milrinone is stopped   Discharge Planning: Home with Hospice      Primary Diagnoses: Present on Admission:  STEMI (ST elevation myocardial infarction) (HCC)   I have reviewed the medical record, interviewed the patient and family, and examined the patient. The following aspects are pertinent.  Past Medical History:  Diagnosis Date   Acute hypoxemic respiratory failure (HCC) 01/20/2023   Acute kidney injury superimposed on chronic kidney disease (HCC) 10/19/2022   Acute on chronic systolic CHF (congestive heart failure) (HCC) 01/21/2023   Arthritis    CAD (coronary artery disease)    Carotid artery occlusion    Diabetes mellitus    fasting 100-120s   Dizziness    Dysrhythmia    skips beats   GERD (gastroesophageal reflux disease)    History of kidney stones    Hyperlipidemia    Hypertension    IHD (ischemic heart disease)    Prior CABG in 2003   Normal nuclear stress test 03/2011   Peripheral vascular disease (HCC)    Prostate cancer (HCC)    Torn rotator cuff    Social History   Socioeconomic History   Marital status: Married    Spouse name: Not on file   Number of children: 2   Years of education: Not on file   Highest education level: Not  on file  Occupational History   Not on file  Tobacco Use   Smoking status: Former    Packs/day: 1.00    Years: 20.00    Additional pack years: 0.00    Total pack years: 20.00    Types: Cigarettes    Quit date: 10/27/2001    Years since quitting: 21.5    Passive exposure: Never   Smokeless tobacco: Never  Vaping Use   Vaping Use: Never used  Substance and Sexual Activity   Alcohol use: No    Alcohol/week: 0.0 standard drinks of alcohol    Drug use: No   Sexual activity: Not Currently  Other Topics Concern   Not on file  Social History Narrative   Not on file   Social Determinants of Health   Financial Resource Strain: Low Risk  (02/24/2023)   Overall Financial Resource Strain (CARDIA)    Difficulty of Paying Living Expenses: Not very hard  Food Insecurity: No Food Insecurity (02/24/2023)   Hunger Vital Sign    Worried About Running Out of Food in the Last Year: Never true    Ran Out of Food in the Last Year: Never true  Transportation Needs: No Transportation Needs (02/24/2023)   PRAPARE - Administrator, Civil Service (Medical): No    Lack of Transportation (Non-Medical): No  Physical Activity: Not on file  Stress: Not on file  Social Connections: Not on file   Family History  Problem Relation Age of Onset   Heart attack Mother    Diabetes Mother    Heart disease Mother    Hypertension Mother    Heart attack Father    Heart disease Father    Hypertension Father    Diabetes Brother    Diabetes Sister    Diabetes Daughter    Heart disease Daughter    Hypertension Daughter    Diabetes Sister    Breast cancer Neg Hx    Prostate cancer Neg Hx    Colon cancer Neg Hx    Pancreatic cancer Neg Hx    Scheduled Meds:  sodium chloride flush  3 mL Intravenous Q12H   Continuous Infusions:  sodium chloride 10 mL/hr at 05/16/2023 0326   sodium chloride     amiodarone 60 mg/hr (05/20/2023 0544)   amiodarone     furosemide     milrinone     PRN Meds:.sodium chloride, acetaminophen **OR** acetaminophen, albuterol, HYDROmorphone (DILAUDID) injection, sodium chloride flush Medications Prior to Admission:  Prior to Admission medications   Medication Sig Start Date End Date Taking? Authorizing Provider  albuterol (VENTOLIN HFA) 108 (90 Base) MCG/ACT inhaler Inhale 2 puffs into the lungs every 6 (six) hours as needed for wheezing or shortness of breath. 07/02/21  Yes [provider]  amiodarone  (PACERONE) 200 MG tablet Take 1 tablet (200 mg total) by mouth daily. 03/11/23  Yes Andrey Farmer, PA-C  apixaban (ELIQUIS) 5 MG TABS tablet Take 1 tablet (5 mg total) by mouth 2 (two) times daily. 10/07/22  Yes Milford, Anderson Malta, FNP  Budeson-Glycopyrrol-Formoterol (BREZTRI AEROSPHERE) 160-9-4.8 MCG/ACT AERO Inhale 2 puffs into the lungs in the morning and at bedtime. 01/26/23  Yes Omar Person, MD  Cholecalciferol (VITAMIN D-3) 125 MCG (5000 UT) TABS Take 5,000 Units by mouth daily.   Yes [provider]  cyanocobalamin (VITAMIN B12) 1000 MCG tablet Take 1,000 mcg by mouth daily.   Yes [provider]  hydrALAZINE (APRESOLINE) 50 MG tablet Take 0.5 tablets (  25 mg total) by mouth 3 (three) times daily. 01/24/23  Yes Zannie Cove, MD  insulin NPH Human (NOVOLIN N) 100 UNIT/ML injection Inject 35-40 Units into the skin See admin instructions. Inject 40 units into the skin in the morning and 35 units at night 09/08/19  Yes [provider]  insulin regular (NOVOLIN R) 100 units/mL injection Inject 6-15 Units into the skin 3 (three) times daily before meals. Per sliding scale 09/08/19  Yes [provider]  isosorbide mononitrate (IMDUR) 30 MG 24 hr tablet Take 1 tablet (30 mg total) by mouth daily. 12/04/22  Yes Bensimhon, Bevelyn Buckles, MD  LINZESS 72 MCG capsule Take 72 mcg by mouth every morning. 02/09/23  Yes [provider]  Melatonin 10 MG TABS Take 10 mg by mouth at bedtime.   Yes [provider]  milrinone (PRIMACOR) 20 MG/100 ML SOLN infusion Inject 0.01 mg/min into the vein continuous. [facility] 03/04/23  Yes Zannie Cove, MD  pantoprazole (PROTONIX) 40 MG tablet Take 40 mg by mouth daily.   Yes [provider]  potassium chloride SA (KLOR-CON M) 20 MEQ tablet Take 2 tablets (40 mEq total) by mouth daily. 03/26/23 04/29/24 Yes Bensimhon, Bevelyn Buckles, MD  rosuvastatin (CRESTOR) 20 MG tablet Take 1 tablet by mouth once  daily Patient taking differently: Take 20 mg by mouth at bedtime. 08/18/22  Yes Nahser, Deloris Ping, MD  torsemide (DEMADEX) 20 MG tablet Take 2 tablets (40 mg total) by mouth 2 (two) times daily. 03/26/23  Yes Bensimhon, Bevelyn Buckles, MD  nitroGLYCERIN (NITROSTAT) 0.4 MG SL tablet Place 1 tablet (0.4 mg total) under the tongue every 5 (five) minutes as needed for chest pain. Patient not taking: Reported on 05/08/2023 08/31/20   Nahser, Deloris Ping, MD   Allergies  Allergen Reactions   Aldactone [Spironolactone] Other (See Comments)    Swollen breast   Lipitor [Atorvastatin Calcium] Other (See Comments)    Muscle ache   Codeine Rash   Sulfa Drugs Cross Reactors Other (See Comments)    "raw spots"   Sulfamethoxazole-Trimethoprim Rash    Other reaction(s): Rash   Review of Systems  Constitutional:  Positive for activity change, appetite change and fatigue.  Respiratory:  Positive for shortness of breath.   Gastrointestinal:  Negative for nausea and vomiting.  Neurological:  Positive for weakness.    Physical Exam Vitals and nursing note reviewed.  Constitutional:      General: He is not in acute distress.    Appearance: He is ill-appearing.  Pulmonary:     Effort: No respiratory distress.     Comments: Mild conversational dyspnea Skin:    General: Skin is warm and dry.  Neurological:     Mental Status: He is alert and oriented to person, place, and time.     Motor: Weakness present.  Psychiatric:        Attention and Perception: Attention normal.        Behavior: Behavior is cooperative.        Cognition and Memory: Cognition and memory normal.     Vital Signs: BP (!) 124/44   Pulse 65   Temp (!) 97.5 F (36.4 C) (Oral)   Resp (!) 22   Ht 5\' 10"  (1.778 m)   Wt 78.8 kg   SpO2 100%   BMI 24.94 kg/m  Pain Scale: 0-10   Pain Score: 0-No pain   SpO2: SpO2: 100 % O2 Device:SpO2: 100 % O2 Flow Rate: .O2 Flow Rate (L/min):  5 L/min  IO: Intake/output summary:  Intake/Output  Summary (Last 24 hours) at 05/25/2023 1000 Last data filed at 04/27/2023 0558 Gross per 24 hour  Intake 150 ml  Output 200 ml  Net -50 ml    LBM:   Baseline Weight: Weight: 77 kg Most recent weight: Weight: 78.8 kg     Palliative Assessment/Data: PPS 20-30%     Time In: 1000 Time Out: 1128 Time Total: 88 minutes  Greater than 50%  of this time was spent counseling and coordinating care related to the above assessment and plan.  Signed by: Haskel Khan, NP   Please contact Palliative Medicine Team phone at [phone] for questions and concerns.  For individual provider: See Amion  *Portions of this note are a verbal dictation therefore any spelling and/or grammatical errors are due to the "[employer]" system interpretation.

## 2023-05-28 NOTE — ED Notes (Signed)
Family approached nursing station requesting to not be admitted to hospital, would instead like to go home with hospice care. Attending Ghimire shanker notified.

## 2023-05-28 NOTE — H&P (Addendum)
Cardiology Admission History and Physical:   Patient ID: [name] MRN: [id number]; DOB: 12-06-1945   Admission date: 05/11/2023  Primary Care Provider: Gaspar Garbe, MD Primary Cardiologist: Arvilla Meres, MD   Chief Complaint:  Syncope with [facility]   Patient Profile:   [name] is a 77 y.o. male with a history of CAD s/p CABG 2003 with recent SVG failure, ICM with EF 30%, HTN, DM, HLD, PAD with prior stents, and prior CEAs.   History of Present Illness:   [name] is accompanied by his daughter [name]. She states that there were two episodes one at 2100 and 0100 when [name] fainted while sitting in the chair. Daughter [name] was not present during the events, but states that he came to within minutes without post ictal state. [name] states that he does not recall those episodes, but states that at 2 am he woke up and could not breathe while laying flat. Symptoms accompanied with nausea. EKG in the field with EMS demonstrated afib wiith RVR rates of 130 with STE inferiorly.   Of note, he has had 2 prior admissions in the last two months notably for inferior WMA concerning for graft failure. He was treated medically due to being unable to intervene on graft or native vessels with increased risk of contrast nephropathy in the setting of CKD. He was also started on milrinone during his April admission due to decompensated HF( eelvated filling pressures; CI 2.37 on milrinone 0.25 mcg/min). [name] is DNR/DNI.   Past Medical History:  Diagnosis Date   Acute hypoxemic respiratory failure (HCC) 01/20/2023   Acute kidney injury superimposed on chronic kidney disease (HCC) 10/19/2022   Acute on chronic systolic CHF (congestive heart failure) (HCC) 01/21/2023   Arthritis    CAD (coronary artery disease)    Carotid artery occlusion    Diabetes mellitus    fasting 100-120s   Dizziness    Dysrhythmia    skips beats   GERD (gastroesophageal reflux disease)    History of kidney  stones    Hyperlipidemia    Hypertension    IHD (ischemic heart disease)    Prior CABG in 2003   Normal nuclear stress test 03/2011   Peripheral vascular disease (HCC)    Prostate cancer (HCC)    Torn rotator cuff     Past Surgical History:  Procedure Laterality Date   ABDOMINAL AORTAGRAM N/A 12/21/2013   Procedure: ABDOMINAL Ronny Flurry;  Surgeon: Sherren Kerns, MD;  Location: [facility] CATH LAB;  Service: Cardiovascular;  Laterality: N/A;   ABDOMINAL AORTOGRAM N/A 11/13/2017   Procedure: ABDOMINAL AORTOGRAM;  Surgeon: Sherren Kerns, MD;  Location: [facility] INVASIVE CV LAB;  Service: Cardiovascular;  Laterality: N/A;   APPENDECTOMY     ARCH AORTOGRAM  08/26/2013   Procedure: ARCH AORTOGRAM;  Surgeon: Sherren Kerns, MD;  Location: [facility] CATH LAB;  Service: Cardiovascular;;   CARDIAC CATHETERIZATION  05/03/2002   EF 40-45%   CARDIOVASCULAR STRESS TEST  08/10/2006   EF 65%   CARDIOVERSION N/A 10/22/2022   Procedure: CARDIOVERSION;  Surgeon: Dolores Patty, MD;  Location: [facility] ENDOSCOPY;  Service: Cardiovascular;  Laterality: N/A;   CARDIOVERSION N/A 02/23/2023   Procedure: CARDIOVERSION;  Surgeon: Dolores Patty, MD;  Location: [facility] INVASIVE CV LAB;  Service: Cardiovascular;  Laterality: N/A;   CAROTID ENDARTERECTOMY     CAROTID STENT INSERTION Left 08/26/2013   Procedure: CAROTID STENT INSERTION;  Surgeon: Sherren Kerns, MD;  Location: [facility] CATH  LAB;  Service: Cardiovascular;  Laterality: Left;  internal carotid   CORONARY ARTERY BYPASS GRAFT  04/2002   DOPPLER ECHOCARDIOGRAPHY  08/12/2002   EF 80-85%   ENDARTERECTOMY Left 07/20/2013   Procedure: ATTEMPTED ENDARTERECTOMY CAROTID;  Surgeon: Fransisco Hertz, MD;  Location: [facility] OR;  Service: Vascular;  Laterality: Left;   HERNIA REPAIR     IR FLUORO GUIDE CV LINE RIGHT  03/03/2023   IR US GUIDE VASC ACCESS RIGHT  03/03/2023   LOWER EXTREMITY ANGIOGRAPHY  11/13/2017   Procedure: Lower Extremity Angiography;  Surgeon: Sherren Kerns, MD;   Location: [facility] INVASIVE CV LAB;  Service: Cardiovascular;;   PERIPHERAL VASCULAR INTERVENTION Right 11/13/2017   Procedure: PERIPHERAL VASCULAR INTERVENTION;  Surgeon: Sherren Kerns, MD;  Location: [facility] INVASIVE CV LAB;  Service: Cardiovascular;  Laterality: Right;  superficial femoral   RIGHT HEART CATH N/A 09/23/2022   Procedure: RIGHT HEART CATH;  Surgeon: Dolores Patty, MD;  Location: [facility] INVASIVE CV LAB;  Service: Cardiovascular;  Laterality: N/A;   RIGHT HEART CATH N/A 02/18/2023   Procedure: RIGHT HEART CATH;  Surgeon: Laurey Morale, MD;  Location: [facility] INVASIVE CV LAB;  Service: Cardiovascular;  Laterality: N/A;   SHOULDER SURGERY     TEE WITHOUT CARDIOVERSION N/A 10/22/2022   Procedure: TRANSESOPHAGEAL ECHOCARDIOGRAM (TEE);  Surgeon: Dolores Patty, MD;  Location: [facility] ENDOSCOPY;  Service: Cardiovascular;  Laterality: N/A;   TEE WITHOUT CARDIOVERSION N/A 02/23/2023   Procedure: TRANSESOPHAGEAL ECHOCARDIOGRAM;  Surgeon: Dolores Patty, MD;  Location: [facility] INVASIVE CV LAB;  Service: Cardiovascular;  Laterality: N/A;     Medications Prior to Admission: Prior to Admission medications   Medication Sig Start Date End Date Taking? Authorizing Provider  albuterol (VENTOLIN HFA) 108 (90 Base) MCG/ACT inhaler Inhale 2 puffs into the lungs every 6 (six) hours as needed for wheezing or shortness of breath. 07/02/21   [provider]  amiodarone (PACERONE) 200 MG tablet Take 1 tablet (200 mg total) by mouth daily. 03/11/23   Andrey Farmer, PA-C  apixaban (ELIQUIS) 5 MG TABS tablet Take 1 tablet (5 mg total) by mouth 2 (two) times daily. 10/07/22   Milford, Anderson Malta, FNP  Budeson-Glycopyrrol-Formoterol (BREZTRI AEROSPHERE) 160-9-4.8 MCG/ACT AERO Inhale 2 puffs into the lungs in the morning and at bedtime. 01/26/23   Omar Person, MD  Cholecalciferol (VITAMIN D-3) 125 MCG (5000 UT) TABS Take 5,000 Units by mouth daily.    [provider]  cyanocobalamin (VITAMIN  B12) 1000 MCG tablet Take 1,000 mcg by mouth daily.    [provider]  hydrALAZINE (APRESOLINE) 50 MG tablet Take 0.5 tablets (25 mg total) by mouth 3 (three) times daily. 01/24/23   Zannie Cove, MD  insulin NPH Human (NOVOLIN N) 100 UNIT/ML injection Inject 35-40 Units into the skin See admin instructions. Inject 40 units into the skin in the morning and 35 units at night 09/08/19   [provider]  insulin regular (NOVOLIN R) 100 units/mL injection Inject 6-15 Units into the skin 3 (three) times daily before meals. Per sliding scale 09/08/19   [provider]  isosorbide mononitrate (IMDUR) 30 MG 24 hr tablet Take 1 tablet (30 mg total) by mouth daily. 12/04/22   Bensimhon, Bevelyn Buckles, MD  LINZESS 72 MCG capsule Take 72 mcg by mouth every morning. 02/09/23   [provider]  milrinone (PRIMACOR) 20 MG/100 ML SOLN infusion Inject 0.01 mg/min into the vein continuous. [facility] 03/04/23   Zannie Cove, MD  nitroGLYCERIN (NITROSTAT) 0.4 MG SL tablet Place 1 tablet (0.4 mg total) under the tongue every 5 (five) minutes as needed for chest pain. 08/31/20   Nahser, Deloris Ping, MD  pantoprazole (PROTONIX) 40 MG tablet Take 40 mg by mouth daily.    [provider]  potassium chloride SA (KLOR-CON M) 20 MEQ tablet Take 2 tablets (40 mEq total) by mouth daily. 03/26/23 04/25/23  Bensimhon, Bevelyn Buckles, MD  rosuvastatin (CRESTOR) 20 MG tablet Take 1 tablet by mouth once daily 08/18/22   Nahser, Deloris Ping, MD  torsemide (DEMADEX) 20 MG tablet Take 2 tablets (40 mg total) by mouth 2 (two) times daily. 03/26/23   Bensimhon, Bevelyn Buckles, MD     Allergies:    Allergies  Allergen Reactions   Aldactone [Spironolactone] Other (See Comments)    Swollen breast   Lipitor [Atorvastatin Calcium] Other (See Comments)    Muscle ache   Codeine Rash   Sulfa Drugs Cross Reactors Other (See Comments)    "raw spots"   Sulfamethoxazole-Trimethoprim Rash    Other reaction(s):  Rash    Social History:   Social History   Socioeconomic History   Marital status: Married    Spouse name: Not on file   Number of children: 2   Years of education: Not on file   Highest education level: Not on file  Occupational History   Not on file  Tobacco Use   Smoking status: Former    Packs/day: 1.00    Years: 20.00    Additional pack years: 0.00    Total pack years: 20.00    Types: Cigarettes    Quit date: 10/27/2001    Years since quitting: 21.5    Passive exposure: Never   Smokeless tobacco: Never  Vaping Use   Vaping Use: Never used  Substance and Sexual Activity   Alcohol use: No    Alcohol/week: 0.0 standard drinks of alcohol   Drug use: No   Sexual activity: Not Currently  Other Topics Concern   Not on file  Social History Narrative   Not on file   Social Determinants of Health   Financial Resource Strain: Low Risk  (02/24/2023)   Overall Financial Resource Strain (CARDIA)    Difficulty of Paying Living Expenses: Not very hard  Food Insecurity: No Food Insecurity (02/24/2023)   Hunger Vital Sign    Worried About Running Out of Food in the Last Year: Never true    Ran Out of Food in the Last Year: Never true  Transportation Needs: No Transportation Needs (02/24/2023)   PRAPARE - Administrator, Civil Service (Medical): No    Lack of Transportation (Non-Medical): No  Physical Activity: Not on file  Stress: Not on file  Social Connections: Not on file  Intimate Partner Violence: Not At Risk (02/26/2023)   Humiliation, Afraid, Rape, and Kick questionnaire    Fear of Current or Ex-Partner: No    Emotionally Abused: No    Physically Abused: No    Sexually Abused: No    Family History:  The patient's family history includes Diabetes in his brother, daughter, mother, sister, and sister; Heart attack in his father and mother; Heart disease in his daughter, father, and mother; Hypertension in his daughter, father, and mother. There is no history of  Breast cancer, Prostate cancer, Colon cancer, or Pancreatic cancer.    Review of Systems: [y] = yes, [ ]  = no    General: Weight gain [ N];  Cardiac:  Chest pain/pressure [n ]; Resting SOB [ y]; Exertional SOB Cove.Etienne ]; Orthopnea Cove.Etienne ]; Pedal Edema [n ]; Palpitations [n ]; Syncope [ y];  Pulmonary: Cough [n ] GI: Vomiting[n ];  GU: Hematuria[ n] Neuro: Syncope [ y]  Ortho/Skin: Bruising [y] Psych: Depression[ ] ; Anxiety[ ]   Heme: Bleeding problems [ y- with heparin] Endocrine: Diabetes Cove.Etienne ];   Physical Exam/Data:   Vitals:   05/23/2023 0245 05/17/2023 0300 05/15/2023 0332 05/12/2023 0400  BP: 119/60  (!) 107/52 (!) 114/49  Pulse: (!) 108 (!) 110 72 62  Resp: (!) 21 (!) 22 (!) 28 (!) 21  Temp:      TempSrc:      SpO2: 100% 100% 99% 97%  Weight:      Height:        Intake/Output Summary (Last 24 hours) at 04/28/2023 0436 Last data filed at 05/09/2023 0336 Gross per 24 hour  Intake 100 ml  Output --  Net 100 ml   Filed Weights   05/11/2023 0215 05/26/2023 0216  Weight: 77 kg 78.8 kg   Body mass index is 24.94 kg/m.  General:  Conversational dyspnea with tripoding  HEENT: normal Lymph: no adenopathy Neck: JVP 19 cm  Cardiac:  normal S1, S2 with S3; III/VI LLSB HSM Lungs:  clear to auscultation bilaterally, no wheezing, rhonchi or rales  Abd: soft, nontender, no hepatomegaly  Ext: no peripheral edema Musculoskeletal:  No deformities, BUE and BLE strength normal and equal Skin: warm and dry  Neuro:  no focal abnormalities noted Psych:  Normal affect    EKG:  EKG with Afib with RVR with STE in inferior leads with Q waves  Laboratory Data:  Chemistry Recent Labs  Lab 05/03/2023 0217 05/07/2023 0224  NA 130* 132*  132*  K 4.9 4.8  4.8  CL 97* 102  CO2 13*  --   GLUCOSE 149* 145*  BUN 73* 72*  CREATININE 3.32* 3.40*  CALCIUM 9.7  --   GFRNONAA 19*  --   ANIONGAP 20*  --     Recent Labs  Lab 05/02/2023 0217  PROT 8.0  ALBUMIN 3.6  AST 87*  ALT 57*  ALKPHOS 102  BILITOT  1.0   Hematology Recent Labs  Lab 05/15/2023 0217 04/27/2023 0224  WBC 10.8*  --   RBC 4.46  --   HGB 11.5* 14.3  13.6  HCT 38.8* 42.0  40.0  MCV 87.0  --   MCH 25.8*  --   MCHC 29.6*  --   RDW 17.6*  --   PLT 318  --    Cardiac EnzymesNo results for input(s): "TROPONINI" in the last 168 hours. No results for input(s): "TROPIPOC" in the last 168 hours.  BNPNo results for input(s): "BNP", "PROBNP" in the last 168 hours.  DDimer No results for input(s): "DDIMER" in the last 168 hours.  Radiology/Studies:  CXR without acute process. No signs of   Assessment and Plan:   Acute Decompensated Heart Failure  Presented with decompensated heart failure in the setting of afib with RVR and resultant ST segment elevation. It is likely Ms. Seth went into afib, followed by heart failure, and demand ischemia.  -He had moderate TR on ECHO, JVP 19 cm on exam.  -EKG with ST elevation in II, III, avF which is new compared to prior EKG -Does not appear to clinically cardiogenic shock, he appears warm, and hypertensive while in the ED, his lactate appears to be improving -Weight 173 lbs (prior discharge weight  172 lbs)  -BNP pending  - AST/ALT elevated Txt - Continue milrinone 0.25 mcg/min, pending hemodynamics and worsening kidney function, may transition him to dobutamine (prior aflutter while on dobutamine) - Continue home imdur for afterload reduction  - 160 mg of Lasix x1  - Obtain Lactate - Strict I/Os   Demand Ischemia  Patient with ST elevation in inferior leads with previously suspected graft failure and inferior wall abnormalities. With initial ST elevation elevation in inferior leads in the setting of afib with RVR. Repeat EKG several hours later with ectopic atrial rhythm without LBBB and STE inferiorly.  -hs-troponin 7,551 Txt -Bolus with amiodarone 150 mg (hold home amiodarone) followed by bolus  -Begin heparin gtt with ACS ptt  -ECHO in the AM   Atrial Fibrillation with RVR   -IV bolus of amiodarone -Hold apixaban   Syncopal Episodes  -Etiology is unclear; prior history of TIAs. Will monitor telemetry -Obtain CK  -ECHO in the AM  -CT Head pending   Acute Kidney Injury on CKD  - Diuresis   Primary Metabolic Acidosis with Secondary Respiratory Acidosis and Non anion Gap Acidosis -Likely related to acute event, patient does not appear to be cardiogenic shock  -Obtain another lactate in 4 hours   Severity of Illness: The appropriate patient status for this patient is INPATIENT. Inpatient status is judged to be reasonable and necessary in order to provide the required intensity of service to ensure the patient's safety. The patient's presenting symptoms, physical exam findings, and initial radiographic and laboratory data in the context of their chronic comorbidities is felt to place them at high risk for further clinical deterioration. Furthermore, it is not anticipated that the patient will be medically stable for discharge from the hospital within 2 midnights of admission.   * I certify that at the point of admission it is my clinical judgment that the patient will require inpatient hospital care spanning beyond 2 midnights from the point of admission due to high intensity of service, high risk for further deterioration and high frequency of surveillance required.*   For questions or updates, please contact  [facility] Please consult [email or web address] for contact info under   Signed, Urban Gibson, MD  05/03/2023 4:36 AM

## 2023-05-28 NOTE — ED Notes (Signed)
Pt transported home at this time via ambulance. Pt and pt daughter verbalized understanding of plan. Hospice RN confirmed equipment set up and ready for pt at home. Copy of DNR placed on chart with EMS. Pt remains a&ox4 vss. IV and foley removed prior to DC per pt request.

## 2023-05-28 NOTE — ED Notes (Signed)
Pt concerned that his BG may be dropping. CBG checked.

## 2023-05-28 NOTE — Discharge Planning (Signed)
[name] J. Lucretia Roers, RN, BSN, [place] [phone] RNCM spoke with pt at bedside regarding discharge planning for Home Hospice Services. Offered pt medicare.gov list of home hospice agencies to choose from.  Pt chose [facility] to render services. Cheri of [facility] notified.

## 2023-05-28 NOTE — Discharge Summary (Addendum)
PATIENT DETAILS Name: [name] Age: 77 y.o. Sex: male Date of Birth: 12-29-1945 MRN: [id number]. Admitting Physician: Maretta Bees, MD WUJ:[id number], Adelfa Koh, MD  Admit Date: 04/28/2023 Discharge date: 05/05/2023  Recommendations for Outpatient Follow-up:  Comfort care-Home with hospice  Admitted From:  Home with hospice  Disposition: Hospice care at home-full comfort measures   Discharge Condition: poor  CODE STATUS:   Code Status: DNR   Diet recommendation:  Diet Order             Diet heart healthy/carb modified Room service appropriate? Yes; Fluid consistency: Thin  Diet effective now           Diet general                    Brief Summary: 77 year old with history of end-stage heart failure on milrinone infusion at home-presented with STEMI, acute on chronic HFrEF and 2 syncopal episodes.  Not felt to be a intervention candidate per interventional cardiology-after extensive discussion with patient/family-plans are to transition to comfort care and discharge patient home with hospice.  Brief Hospital Course: STEMI Not a candidate for PCI or any other of intervention-please see interventional cardiology note Given aspirin/statin/IV heparin while in the hospital-after extensive discussion with family-plans are to transition to hospice care on discharge  Syncope 2 episodes earlier this morning Suspicion for ventricular tachycardia Amiodarone infusion while hospitalized-plans to transition to comfort care with home hospice on discharge.  Acute on chronic hypoxic respiratory failure secondary to acute on chronic HFpEF (end-stage heart failure on milrinone infusion at home-not a candidate for advanced heart failure therapies Maintained on milrinone and amiodarone infusion Plans are to continue milrinone infusion till he gets home-following which this will be discontinued Comfort care plans-life expectancy expected to be only a few days.  A-fib  RVR Maintained on amiodarone infusion and IV heparin while hospitalized Comfort care measures on discharge  AKI on CKD stage IIIb AKI likely hemodynamically mediated-cardiorenal syndrome Not a HD candidate Plans are for comfort care on discharge  DM-2 Since being transition to comfort measures-doubt requires any further insulin  GERD No plans to resume PPI  COPD Can continue bronchodilators for comfort on discharge  Goals of care/palliative care DNR This MD had a long discussion with patient/daughter at bedside-they have elected to go home with comfort measures.  They want to continue milrinone infusion until he gets home-following which this will be discontinued Case manager and palliative care team followed-patient now will be followed by  [facility] Dilaudid/Ativan as needed for comfort-daughter is a Charity fundraiser who can titrate medications  BMI: Estimated body mass index is 24.94 kg/m as calculated from the following:   Height as of this encounter: 5\' 10"  (1.778 m).   Weight as of this encounter: 78.8 kg.    Discharge Diagnoses:  Principal Problem:   STEMI (ST elevation myocardial infarction) [facility])   Discharge Instructions:  Activity:  As tolerated with Full fall precautions use walker/cane & assistance as needed   Discharge Instructions     Diet general   Complete by: As directed    Discharge instructions   Complete by: As directed    Follow with Primary MD  [name], Adelfa Koh, MD as needed.  Get Medicines reviewed and adjusted: Please take all your medications with you for your next visit with your Primary MD  Laboratory/radiological data: Please request your Primary MD to go over all hospital tests and procedure/radiological results at the follow  up, please ask your Primary MD to get all Hospital records sent to his/her office.  In some cases, they will be blood work, cultures and biopsy results pending at the time of your discharge. Please request  that your primary care M.D. follows up on these results.  Also Note the following: If you experience worsening of your admission symptoms, develop shortness of breath, life threatening emergency, suicidal or homicidal thoughts you must seek medical attention immediately by calling 911 or calling your MD immediately  if symptoms less severe.  You must read complete instructions/literature along with all the possible adverse reactions/side effects for all the Medicines you take and that have been prescribed to you. Take any new Medicines after you have completely understood and accpet all the possible adverse reactions/side effects.   Do not drive when taking Pain medications or sleeping medications (Benzodaizepines)  Do not take more than prescribed Pain, Sleep and Anxiety Medications. It is not advisable to combine anxiety,sleep and pain medications without talking with your primary care practitioner  Special Instructions: If you have smoked or chewed Tobacco  in the last 2 yrs please stop smoking, stop any regular Alcohol  and or any Recreational drug use.  Wear Seat belts while driving.  Please note: You were cared for by a hospitalist during your hospital stay. Once you are discharged, your primary care physician will handle any further medical issues. Please note that NO REFILLS for any discharge medications will be authorized once you are discharged, as it is imperative that you return to your primary care physician (or establish a relationship with a primary care physician if you do not have one) for your post hospital discharge needs so that they can reassess your need for medications and monitor your lab values.   Increase activity slowly   Complete by: As directed       Allergies as of 05/23/2023       Reactions   Aldactone [spironolactone] Other (See Comments)   Swollen breast   Lipitor [atorvastatin Calcium] Other (See Comments)   Muscle ache   Codeine Rash   Sulfa Drugs Cross  Reactors Other (See Comments)   "raw spots"   Sulfamethoxazole-trimethoprim Rash   Other reaction(s): Rash        Medication List     STOP taking these medications    apixaban 5 MG Tabs tablet Commonly known as: ELIQUIS   cyanocobalamin 1000 MCG tablet Commonly known as: VITAMIN B12   hydrALAZINE 50 MG tablet Commonly known as: APRESOLINE   insulin NPH Human 100 UNIT/ML injection Commonly known as: NOVOLIN N   insulin regular 100 units/mL injection Commonly known as: NOVOLIN R   isosorbide mononitrate 30 MG 24 hr tablet Commonly known as: IMDUR   Linzess 72 MCG capsule Generic drug: linaclotide   pantoprazole 40 MG tablet Commonly known as: PROTONIX   potassium chloride SA 20 MEQ tablet Commonly known as: KLOR-CON M   rosuvastatin 20 MG tablet Commonly known as: CRESTOR   torsemide 20 MG tablet Commonly known as: DEMADEX   Vitamin D-3 125 MCG (5000 UT) Tabs       TAKE these medications    albuterol 108 (90 Base) MCG/ACT inhaler Commonly known as: VENTOLIN HFA Inhale 2 puffs into the lungs every 6 (six) hours as needed for wheezing or shortness of breath.   amiodarone 200 MG tablet Commonly known as: PACERONE Take 1 tablet (200 mg total) by mouth daily.   Breztri Aerosphere 160-9-4.8 MCG/ACT Aero Generic drug:  Budeson-Glycopyrrol-Formoterol Inhale 2 puffs into the lungs in the morning and at bedtime.   HYDROmorphone 2 MG tablet Commonly known as: Dilaudid Take 1 tablet (2 mg total) by mouth every 12 (twelve) hours as needed for up to 5 days for severe pain.   LORazepam 2 MG/ML concentrated solution Commonly known as: ATIVAN Take 0.5 mLs (1 mg total) by mouth every 6 (six) hours as needed for anxiety, sedation, sleep or seizure (comfort).   Melatonin 10 MG Tabs Take 10 mg by mouth at bedtime.   milrinone 20 MG/100 ML Soln infusion Commonly known as: PRIMACOR Inject 0.01 mg/min into the vein continuous. [facility] Infusion    nitroGLYCERIN 0.4 MG SL tablet Commonly known as: NITROSTAT Place 1 tablet (0.4 mg total) under the tongue every 5 (five) minutes as needed for chest pain.   ondansetron 4 MG tablet Commonly known as: Zofran Take 1 tablet (4 mg total) by mouth every 8 (eight) hours as needed for nausea or vomiting.         Follow-up Information     [name], Adelfa Koh, MD Follow up.   Specialty: Internal Medicine Why: As needed Contact information: [address] [place] [address] [phone]                Allergies  Allergen Reactions   Aldactone [Spironolactone] Other (See Comments)    Swollen breast   Lipitor [Atorvastatin Calcium] Other (See Comments)    Muscle ache   Codeine Rash   Sulfa Drugs Cross Reactors Other (See Comments)    "raw spots"   Sulfamethoxazole-Trimethoprim Rash    Other reaction(s): Rash     Other Procedures/Studies: CT HEAD WO CONTRAST ( )  Result Date: 05/06/2023 CLINICAL DATA:  Head trauma, moderate to severe. Syncopal episode this followed by vomiting EXAM: CT HEAD WITHOUT CONTRAST TECHNIQUE: Contiguous axial images were obtained from the base of the skull through the vertex without intravenous contrast. RADIATION DOSE REDUCTION: This exam was performed according to the departmental dose-optimization program which includes automated exposure control, adjustment of the mA and/or kV according to patient size and/or use of iterative reconstruction technique. COMPARISON:  09/10/2020 FINDINGS: Brain: No evidence of acute infarction, hemorrhage, hydrocephalus, extra-axial collection or mass lesion/mass effect. Generalized cerebral volume loss. Mild-to-moderate chronic small vessel ischemia in the cerebral white matter. Vascular: No hyperdense vessel or unexpected calcification. Skull: Normal. Negative for fracture or focal lesion. Sinuses/Orbits: No acute finding. IMPRESSION: Aging brain without acute or reversible finding. Electronically Signed   By:  Tiburcio Pea M.D.   On: 05/19/2023 04:19   DG Chest Port 1 View  Result Date: 05/15/2023 CLINICAL DATA:  Shortness of breath EXAM: PORTABLE CHEST 1 VIEW COMPARISON:  02/13/2023 FINDINGS: Cardiomegaly. Right IJ approached central venous catheter tip at the cavoatrial junction. No pneumothorax or sizable pleural effusion. Left mediastinal surgical clips. Remote median sternotomy. No focal consolidation. IMPRESSION: Cardiomegaly without overt pulmonary edema or focal consolidation. Electronically Signed   By: Deatra Robinson M.D.   On: 05/16/2023 02:45     TODAY-DAY OF DISCHARGE:  Subjective:   Cowan Solazzo today has appears relatively comfortable-plans to go home with hospice care today.    Objective:   Blood pressure (!) 125/111, pulse 68, temperature 97.6 F (36.4 C), temperature source Oral, resp. rate 18, height 5\' 10"  (1.778 m), weight 78.8 kg, SpO2 99 %.  Intake/Output Summary (Last 24 hours) at 04/28/2023 1245 Last data filed at 05/13/2023 0558 Gross per 24 hour  Intake 150 ml  Output  200 ml  Net -50 ml   Filed Weights   05/02/2023 0215 04/28/2023 0216  Weight: 77 kg 78.8 kg    Exam: Awake Alert, Oriented *3, No new F.N deficits, Normal affect [place]man.AT,PERRAL Supple Neck,No JVD, No cervical lymphadenopathy appriciated.  Symmetrical Chest wall movement, Good air movement bilaterally, CTAB RRR,No Gallops,Rubs or new Murmurs, No Parasternal Heave +ve B.Sounds, Abd Soft, Non tender, No organomegaly appriciated, No rebound -guarding or rigidity. No Cyanosis, Clubbing or edema, No new Rash or bruise   PERTINENT RADIOLOGIC STUDIES: CT HEAD WO CONTRAST ( )  Result Date: 04/27/2023 CLINICAL DATA:  Head trauma, moderate to severe. Syncopal episode this followed by vomiting EXAM: CT HEAD WITHOUT CONTRAST TECHNIQUE: Contiguous axial images were obtained from the base of the skull through the vertex without intravenous contrast. RADIATION DOSE REDUCTION: This exam was performed according to  the departmental dose-optimization program which includes automated exposure control, adjustment of the mA and/or kV according to patient size and/or use of iterative reconstruction technique. COMPARISON:  09/10/2020 FINDINGS: Brain: No evidence of acute infarction, hemorrhage, hydrocephalus, extra-axial collection or mass lesion/mass effect. Generalized cerebral volume loss. Mild-to-moderate chronic small vessel ischemia in the cerebral white matter. Vascular: No hyperdense vessel or unexpected calcification. Skull: Normal. Negative for fracture or focal lesion. Sinuses/Orbits: No acute finding. IMPRESSION: Aging brain without acute or reversible finding. Electronically Signed   By: Tiburcio Pea M.D.   On: 04/27/2023 04:19   DG Chest Port 1 View  Result Date: 05/11/2023 CLINICAL DATA:  Shortness of breath EXAM: PORTABLE CHEST 1 VIEW COMPARISON:  02/13/2023 FINDINGS: Cardiomegaly. Right IJ approached central venous catheter tip at the cavoatrial junction. No pneumothorax or sizable pleural effusion. Left mediastinal surgical clips. Remote median sternotomy. No focal consolidation. IMPRESSION: Cardiomegaly without overt pulmonary edema or focal consolidation. Electronically Signed   By: Deatra Robinson M.D.   On: 05/21/2023 02:45     PERTINENT LAB RESULTS: CBC: Recent Labs    05/22/2023 0217 05/06/2023 0224  WBC 10.8*  --   HGB 11.5* 14.3  13.6  HCT 38.8* 42.0  40.0  PLT 318  --    CMET CMP     Component Value Date/Time   NA 132 (L) 05/16/2023 0224   NA 132 (L) 05/27/2023 0224   NA 138 04/22/2023 1151   K 4.8 05/07/2023 0224   K 4.8 05/07/2023 0224   CL 102 05/04/2023 0224   CO2 13 (L) 05/27/2023 0217   GLUCOSE 145 (H) 05/18/2023 0224   BUN 72 (H) 05/07/2023 0224   BUN 63 (H) 04/22/2023 1151   CREATININE 3.40 (H) 05/14/2023 0224   CREATININE 0.99 06/13/2016 0918   CALCIUM 9.7 05/04/2023 0217   PROT 8.0 04/27/2023 0217   PROT 7.4 08/24/2018 0825   ALBUMIN 3.6 05/10/2023 0217    ALBUMIN 4.4 08/24/2018 0825   AST 87 (H) 05/17/2023 0217   ALT 57 (H) 04/29/2023 0217   ALKPHOS 102 05/06/2023 0217   BILITOT 1.0 05/14/2023 0217   BILITOT 0.4 08/24/2018 0825   GFR 77.26 05/23/2015 0916   EGFR 30 (L) 04/22/2023 1151   GFRNONAA 19 (L) 04/27/2023 0217    GFR Estimated Creatinine Clearance: 19.4 mL/min (A) (by C-G formula based on SCr of 3.4 mg/dL (H)). No results for input(s): "LIPASE", "AMYLASE" in the last 72 hours. Recent Labs    05/20/2023 0217  CKTOTAL 251   Invalid input(s): "POCBNP" No results for input(s): "DDIMER" in the last 72 hours. Recent Labs    05/20/2023 0217  HGBA1C 7.4*   Recent Labs    05/25/2023 0217  CHOL 148  HDL 65  LDLCALC 72  TRIG 57  CHOLHDL 2.3   No results for input(s): "TSH", "T4TOTAL", "T3FREE", "THYROIDAB" in the last 72 hours.  Invalid input(s): "FREET3" No results for input(s): "VITAMINB12", "FOLATE", "FERRITIN", "TIBC", "IRON", "RETICCTPCT" in the last 72 hours. Coags: Recent Labs    05/12/2023 0217  INR 2.7*   Microbiology: No results found for this or any previous visit (from the past 240 hour(s)).  FURTHER DISCHARGE INSTRUCTIONS:  Get Medicines reviewed and adjusted: Please take all your medications with you for your next visit with your Primary MD  Laboratory/radiological data: Please request your Primary MD to go over all hospital tests and procedure/radiological results at the follow up, please ask your Primary MD to get all Hospital records sent to his/her office.  In some cases, they will be blood work, cultures and biopsy results pending at the time of your discharge. Please request that your primary care M.D. goes through all the records of your hospital data and follows up on these results.  Also Note the following: If you experience worsening of your admission symptoms, develop shortness of breath, life threatening emergency, suicidal or homicidal thoughts you must seek medical attention immediately by  calling 911 or calling your MD immediately  if symptoms less severe.  You must read complete instructions/literature along with all the possible adverse reactions/side effects for all the Medicines you take and that have been prescribed to you. Take any new Medicines after you have completely understood and accpet all the possible adverse reactions/side effects.   Do not drive when taking Pain medications or sleeping medications (Benzodaizepines)  Do not take more than prescribed Pain, Sleep and Anxiety Medications. It is not advisable to combine anxiety,sleep and pain medications without talking with your primary care practitioner  Special Instructions: If you have smoked or chewed Tobacco  in the last 2 yrs please stop smoking, stop any regular Alcohol  and or any Recreational drug use.  Wear Seat belts while driving.  Please note: You were cared for by a hospitalist during your hospital stay. Once you are discharged, your primary care physician will handle any further medical issues. Please note that NO REFILLS for any discharge medications will be authorized once you are discharged, as it is imperative that you return to your primary care physician (or establish a relationship with a primary care physician if you do not have one) for your post hospital discharge needs so that they can reassess your need for medications and monitor your lab values.  Total Time spent coordinating discharge including counseling, education and face to face time equals greater than 30 minutes.  SignedJeoffrey Massed 05/11/2023 12:45 PM

## 2023-05-28 NOTE — Progress Notes (Signed)
   This referral was received from [facility] CM in [facility] ED. I have reviewed chart and spoken to the daughter who is a Engineer, civil (consulting) and discussed plan with milrinone gtt. Our MD has approved the pt to go home with milrinone gtt until family can visit from out of town. The new bag of milrinone was just hung yesterday and last a week typically. Plan to allow family to be able to come in from out of town and then d/c milrinone once they have been able to say there goodbyes. Family in agreement with this. He is DNR.  We have ordered equipment of 10L concentrator in even he needs increasing O2 amounts with weaning process, (only had a 5 liter in home), hospital bed, over bed table, BSC and W/C for stat delivery. Once this is in home the family will notify me so we can arrange ambulance with [facility] and also get the pt home this evening for hospice enrollment.   Thank you for this referral.  Norm Parcel RN [phone]

## 2023-05-28 NOTE — Progress Notes (Signed)
Informed by nursing staff-that patient/family have made a decision and want to go home with hospice care.  I spoke with the patient-and the daughter again this morning-they both would like to continue milrinone till he gets home-and then would want to discontinue.  Will try to get case management to see if we can arrange for DME services and hospice agency so we could try and get him home later today.

## 2023-05-28 NOTE — ED Provider Notes (Signed)
[facility] Emergency Department Provider Note MRN:  [id number]  Arrival date & time: 05/20/2023     Chief Complaint   Code STEMI   History of Present Illness   [name] is a 77 y.o. year-old male with a history of CHF, CABG presenting to the ED with chief complaint of code STEMI.  Patient endorsing severe shortness of breath and nausea, denies chest pain.  On a milrinone drip for the past month or so.  Code STEMI initiated by EMS.  Review of Systems  A thorough review of systems was obtained and all systems are negative except as noted in the HPI and PMH.   Patient's Health History    Past Medical History:  Diagnosis Date   Acute hypoxemic respiratory failure (HCC) 01/20/2023   Acute kidney injury superimposed on chronic kidney disease (HCC) 10/19/2022   Acute on chronic systolic CHF (congestive heart failure) (HCC) 01/21/2023   Arthritis    CAD (coronary artery disease)    Carotid artery occlusion    Diabetes mellitus    fasting 100-120s   Dizziness    Dysrhythmia    skips beats   GERD (gastroesophageal reflux disease)    History of kidney stones    Hyperlipidemia    Hypertension    IHD (ischemic heart disease)    Prior CABG in 2003   Normal nuclear stress test 03/2011   Peripheral vascular disease (HCC)    Prostate cancer (HCC)    Torn rotator cuff     Past Surgical History:  Procedure Laterality Date   ABDOMINAL AORTAGRAM N/A 12/21/2013   Procedure: ABDOMINAL Ronny Flurry;  Surgeon: Sherren Kerns, MD;  Location: [facility] CATH LAB;  Service: Cardiovascular;  Laterality: N/A;   ABDOMINAL AORTOGRAM N/A 11/13/2017   Procedure: ABDOMINAL AORTOGRAM;  Surgeon: Sherren Kerns, MD;  Location: [facility] INVASIVE CV LAB;  Service: Cardiovascular;  Laterality: N/A;   APPENDECTOMY     ARCH AORTOGRAM  08/26/2013   Procedure: ARCH AORTOGRAM;  Surgeon: Sherren Kerns, MD;  Location: [facility] CATH LAB;  Service: Cardiovascular;;   CARDIAC CATHETERIZATION  05/03/2002    EF 40-45%   CARDIOVASCULAR STRESS TEST  08/10/2006   EF 65%   CARDIOVERSION N/A 10/22/2022   Procedure: CARDIOVERSION;  Surgeon: Dolores Patty, MD;  Location: [facility] ENDOSCOPY;  Service: Cardiovascular;  Laterality: N/A;   CARDIOVERSION N/A 02/23/2023   Procedure: CARDIOVERSION;  Surgeon: Dolores Patty, MD;  [facility] INVAS[facility] CV LAB;  Service: Cardiovascular;  Laterality: N/A;   CAROTID ENDARTERECTOMY     CAROTID STENT INSERTION Left 08/26/2013   Procedure: CAROTID STENT INSERTION;  Surgeon: Sherren Kerns, MD;  Location: [facility] CATH LAB;  Service: Cardiovascular;  Laterality: Left;  internal carotid   CORONARY ARTERY BYPASS GRAFT  04/2002   DOPPLER ECHOCARDIOGRAPHY  08/12/2002   EF 80-85%   ENDARTERECTOMY Left 07/20/2013   Procedure: ATTEMPTED ENDARTERECTOMY CAROTID;  Surgeon: Fransisco Hertz, MD;  Location: [facility];  Service: Vascular;  Laterality: Left;   HERNIA REPAIR     IR FLUORO GUIDE CV LINE RIGHT  03/03/2023   IR US GUIDE VASC ACCESS RIGHT  03/03/2023   LOWER EXTREMITY ANGIOGRAPHY  11/13/2017   Procedure: Lower Extremity Angiography;  Surgeon: Sherren Kerns, MD;  Location: [facility] INVASIVE CV LAB;  Service: Cardiovascular;;   PERIPHERAL VASCULAR INTERVENTION Right 11/13/2017   Procedure: PERIPHERAL VASCULAR INTERVENTION;  Surgeon: Sherren Kerns, MD;  Location: [facility] INVASIVE CV LAB;  Service: Cardiovascular;  Laterality: Right;  superficial femoral   RIGHT HEART CATH N/A 09/23/2022   Procedure: RIGHT HEART CATH;  Surgeon: Dolores Patty, MD;  [facility] INVASIVE CV LAB;  Service: Cardiovascular;  Laterality: N/A;   RIGHT HEART CATH N/A 02/18/2023   Procedure: RIGHT HEART CATH;  Surgeon: Laurey Morale, MD;  Location: [facility] INVASIVE CV LAB;  Service: Cardiovascular;  Laterality: N/A;   SHOULDER SURGERY     TEE WITHOUT CARDIOVERSION N/A 10/22/2022   Procedure: TRANSESOPHAGEAL ECHOCARDIOGRAM (TEE);  Surgeon: Dolores Patty, MD;  Location: [facility] ENDOSCOPY;  Service:  Cardiovascular;  Laterality: N/A;   TEE WITHOUT CARDIOVERSION N/A 02/23/2023   Procedure: TRANSESOPHAGEAL ECHOCARDIOGRAM;  Surgeon: Dolores Patty, MD;  Location: [facility] INVASIVE CV LAB;  Service: Cardiovascular;  Laterality: N/A;    Family History  Problem Relation Age of Onset   Heart attack Mother    Diabetes Mother    Heart disease Mother    Hypertension Mother    Heart attack Father    Heart disease Father    Hypertension Father    Diabetes Brother    Diabetes Sister    Diabetes Daughter    Heart disease Daughter    Hypertension Daughter    Diabetes Sister    Breast cancer Neg Hx    Prostate cancer Neg Hx    Colon cancer Neg Hx    Pancreatic cancer Neg Hx     Social History   Socioeconomic History   Marital status: Married    Spouse name: Not on file   Number of children: 2   Years of education: Not on file   Highest education level: Not on file  Occupational History   Not on file  Tobacco Use   Smoking status: Former    Packs/day: 1.00    Years: 20.00    Additional pack years: 0.00    Total pack years: 20.00    Types: Cigarettes    Quit date: 10/27/2001    Years since quitting: 21.5    Passive exposure: Never   Smokeless tobacco: Never  Vaping Use   Vaping Use: Never used  Substance and Sexual Activity   Alcohol use: No    Alcohol/week: 0.0 standard drinks of alcohol   Drug use: No   Sexual activity: Not Currently  Other Topics Concern   Not on file  Social History Narrative   Not on file   Social Determinants of Health   Financial Resource Strain: Low Risk  (02/24/2023)   Overall Financial Resource Strain (CARDIA)    Difficulty of Paying Living Expenses: Not very hard  Food Insecurity: No Food Insecurity (02/24/2023)   Hunger Vital Sign    Worried About Running Out of Food in the Last Year: Never true    Ran Out of Food in the Last Year: Never true  Transportation Needs: No Transportation Needs (02/24/2023)   PRAPARE - Scientist, research (physical sciences) (Medical): No    Lack of Transportation (Non-Medical): No  Physical Activity: Not on file  Stress: Not on file  Social Connections: Not on file  Intimate Partner Violence: Not At Risk (02/26/2023)   Humiliation, Afraid, Rape, and Kick questionnaire    Fear of Current or Ex-Partner: No    Emotionally Abused: No    Physically Abused: No    Sexually Abused: No     Physical Exam   Vitals:   04/29/2023 0332 05/24/2023 0400  BP: (!) 107/52 (!) 114/49  Pulse: 72 62  Resp: (!) 28 (!)  21  Temp:    SpO2: 99% 97%    CONSTITUTIONAL: Ill-appearing, NAD NEURO/PSYCH:  Alert and oriented x 3, no focal deficits EYES:  eyes equal and reactive ENT/NECK:  no LAD, no JVD CARDIO: Tachycardic rate, well-perfused, normal S1 and S2 PULM:  CTAB no wheezing or rhonchi GI/GU:  non-distended, non-tender MSK/SPINE:  No gross deformities, no edema SKIN:  no rash, atraumatic   *Additional and/or pertinent findings included in MDM below  Diagnostic and Interventional Summary    EKG Interpretation Date/Time:  Thursday April 30 2023 02:12:07 EDT Ventricular Rate:  105 PR Interval:    QRS Duration:  165 QT Interval:  385 QTC Calculation: 519 R Axis:   156  Text Interpretation: Atrial fibrillation concordant st elevation ** ** ACUTE MI / STEMI ** ** Confirmed by Kennis Carina [phone]) on 05/01/2023 2:28:58 AM       Labs Reviewed  HEMOGLOBIN A1C - Abnormal; Notable for the following components:      Result Value   Hgb A1c MFr Bld 7.4 (*)    All other components within normal limits  CBC WITH DIFFERENTIAL/PLATELET - Abnormal; Notable for the following components:   WBC 10.8 (*)    Hemoglobin 11.5 (*)    HCT 38.8 (*)    MCH 25.8 (*)    MCHC 29.6 (*)    RDW 17.6 (*)    Neutro Abs 9.1 (*)    All other components within normal limits  PROTIME-INR - Abnormal; Notable for the following components:   Prothrombin Time 28.6 (*)    INR 2.7 (*)    All other components within normal limits   APTT - Abnormal; Notable for the following components:   aPTT 42 (*)    All other components within normal limits  COMPREHENSIVE METABOLIC PANEL - Abnormal; Notable for the following components:   Sodium 130 (*)    Chloride 97 (*)    CO2 13 (*)    Glucose, Bld 149 (*)    BUN 73 (*)    Creatinine, Ser 3.32 (*)    AST 87 (*)    ALT 57 (*)    GFR, Estimated 19 (*)    Anion gap 20 (*)    All other components within normal limits  LACTIC ACID, PLASMA - Abnormal; Notable for the following components:   Lactic Acid, Venous 6.4 (*)    All other components within normal limits  I-STAT VENOUS BLOOD GAS, ED - Abnormal; Notable for the following components:   pH, Ven 7.241 (*)    pCO2, Ven 33.9 (*)    pO2, Ven 28 (*)    Bicarbonate 14.5 (*)    TCO2 16 (*)    Acid-base deficit 12.0 (*)    Sodium 132 (*)    Calcium, Ion 1.13 (*)    All other components within normal limits  I-STAT CHEM 8, ED - Abnormal; Notable for the following components:   Sodium 132 (*)    BUN 72 (*)    Creatinine, Ser 3.40 (*)    Glucose, Bld 145 (*)    Calcium, Ion 1.12 (*)    TCO2 16 (*)    All other components within normal limits  I-STAT CG4 LACTIC ACID, ED - Abnormal; Notable for the following components:   Lactic Acid, Venous 7.0 (*)    All other components within normal limits  TROPONIN I (HIGH SENSITIVITY) - Abnormal; Notable for the following components:   Troponin I (High Sensitivity) 7,551 (*)    All  other components within normal limits  LIPID PANEL  CK  BRAIN NATRIURETIC PEPTIDE  TROPONIN I (HIGH SENSITIVITY)    DG Chest Port 1 View  Final Result    CT HEAD WO CONTRAST ( )    (Results Pending)    Medications  0.9 %  sodium chloride infusion ( Intravenous New Bag/Given 05/09/2023 0326)  ondansetron (ZOFRAN) injection 4 mg (4 mg Intravenous Not Given 05/03/2023 0303)  furosemide (LASIX) 160 mg in dextrose 5 % 50 mL IVPB (160 mg Intravenous New Bag/Given 05/13/2023 0338)  heparin injection 4,000 Units  (4,000 Units Intravenous Given 05/14/2023 0219)  amiodarone (NEXTERONE) 1.5 mg/mL IV bolus only 150 mg (0 mg Intravenous Stopped 05/27/2023 0336)     Procedures  /  Critical Care .Critical Care  Performed by: Sabas Sous, MD Authorized by: Sabas Sous, MD   Critical care provider statement:    Critical care time (minutes):  80   Critical care was necessary to treat or prevent imminent or life-threatening deterioration of the following conditions: ST elevation myocardial infarction.   Critical care was time spent personally by me on the following activities:  Development of treatment plan with patient or surrogate, discussions with consultants, evaluation of patient's response to treatment, examination of patient, ordering and review of laboratory studies, ordering and review of radiographic studies, ordering and performing treatments and interventions, pulse oximetry, re-evaluation of patient's condition and review of old charts   ED Course and Medical Decision Making  Initial Impression and Ddx Patient has a very complex cardiac history, CHF, prior CABG, follows with Dr. Gala Romney, is on a milrinone drip at home.  EKG concerning for concordant ST elevation in the setting of bundle branch block.  Code STEMI initiated.  Evaluated by cardiology fellow as well as the STEMI doc on-call.  Plan is to treat patient medically given his advanced disease.  Past medical/surgical history that increases complexity of ED encounter: CHF, CABG  Interpretation of Diagnostics I personally reviewed the EKG and my interpretation is as follows: Bundle branch block with ST elevation  Labs reveal markedly elevated troponin greater than 7000, lactate greater than 6, lactic acidosis noted  Patient Reassessment and Ultimate Disposition/Management     On reassessment patient is doing much better, able to lay back in the bed, appears much more comfortable.  Had discussed admission with the ICU, they declined at  this time.  Patient is DNR/DNI.  Receiving palliative care.  Will request stepdown admission.  Patient management required discussion with the following services or consulting groups:  Hospitalist Service, Intensivist Service, and Cardiology  Complexity of Problems Addressed Acute illness or injury that poses threat of life of bodily function  Additional Data Reviewed and Analyzed Further history obtained from: EMS on arrival  Additional Factors Impacting ED Encounter Risk Consideration of hospitalization  Elmer Sow. Pilar Plate, MD [facility] Emergency Medicine [facility] [email or web address]  Final Clinical Impressions(s) / ED Diagnoses     ICD-10-CM   1. ST elevation myocardial infarction (STEMI), unspecified artery (HCC)  I21.3       ED Discharge Orders     None        Discharge Instructions Discussed with and Provided to Patient:   Discharge Instructions   None      Sabas Sous, MD 05/23/2023 [phone]

## 2023-05-28 NOTE — Progress Notes (Addendum)
Interventional Cardiology Note  Called to emergency department due to STEMI activation.  The patient is a 77 year old male with a history of end-stage heart failure on palliative milrinone, coronary artery disease status post CABG in 2003 consisting of a LIMA to LAD, vein graft to diagonal, vein graft to left circumflex and vein graft to PDA to PLA, hypertension, type 2 diabetes, peripheral arterial disease, and chronic kidney disease stage III.  His EKG demonstrates atrial fibrillation with an inferior ST elevation myocardial infarction.  On exam the patient denies chest pain but has been acutely dyspneic and reports worsening orthopnea.  His JVD is elevated to the jaw.  He is somewhat cool to touch.  No significant edema though abdominal distention noted.  I-STAT Chem-8 demonstrated a creatinine of 3.4 and the patient's lactate is 7.  Given the patient's history of end-stage heart failure on palliative milrinone and his other comorbidities including AKI I do not think that he is a candidate for emergent coronary angiography and potential revascularization.  I did reach out to Dr. Shirlee Latch the advanced heart failure attending on call to discuss the case and he agrees with this assessment.  The patient also agrees with this plan.  Case discussed with ED attending.  I called the patient's daughter and left a VM.

## 2023-05-28 NOTE — Consult Note (Addendum)
Advanced Heart Failure Team Consult Note   Primary Physician: [name], Adelfa Koh, MD PCP-Cardiologist:  Arvilla Meres, MD  Reason for Consultation: Cardiogenic shock  HPI:    [name] is seen today for evaluation of cardiogenic shock at the request of Dr. Jerral Ralph with [facility].  77 y.o. male with CAD c/p CABG 2003, chronic diastolic CHF, AFL, chronic DOE, Rt CEA >77yrs ago, Lt CEA 14', HTN, DM2, HLD, PAD w/ Rt SFA and Rt politeal artery vascular stents 15', prostate cancer.    Admitted 11/23 with acute systolic heart failure, newly reduced EF. Echo EF 30-35%, mild concentric LVH, GIIDD, RV function mildly reduced, mild MR, mild to mod TR, mild aortic valve regurg. Underwent RHC showing low filling pressures and preserved CO. GDMT started but limited by AKI. He was discharged home with oxygen, weight 172 lbs.   Echo 01/02/23 EF 35-40% mild AS/mod AI mild MR. Seen for f/u. WMA on echo c/w inferior/inferolateral MI, suspected probable SVG failure. Given CKD and lack of angina did not pursue cath.   Admitted 03/6-03/30/24 with acute on chronic CHF. He was then redmitted 04/16-05/07/24 with acute on chronic CHF with low-output. Required addition of inotrope support. Echo EF 30%, RWMA, RV moderately reduced, RVSP severely elevated, mild MR. RHC on milrinone with stable CO and elevated filling pressures. Course c/b atrial flutter with RVR. He chemically converted with amiodarone but later had recurrence and underwent TEE guided DCCV. He later failed milrinone wean with recurrent volume overload and AKI. Milrinone restarted. He was discharged home on palliative inotrope. Not a candidate for advanced therapies.   Rep[orts he began feeling poorly around 24 hrs ago. Woke up yesterday am with sudden onset of severe weakness and dyspnea. No CP. He apparently had two episodes of decreased responsiveness while sitting in a chair. EMS was called. ECG with atrial fibrillation with RVR and ST elevations in  inferior leads. Code STEMI activated. He was evaluated by STEMI. Medical management decided d/t end-stage advanced heart failure. He converted to SR with amiodarone gtt. Heparin infusion started. His Scr is 3.3, lactic acid 7.0>6.4>5.5, HS troponin peaked at 7,551.  Advanced Heart Failure asked to see to assist with management of cardiogenic shock.   Currently comfortable at rest while sitting upright. Gets dyspneic with minimal movement. No chest pain.   Wants to go home with hospice. He states he is "ready" and at peace with end of life.  Home Medications Prior to Admission medications   Medication Sig Start Date End Date Taking? Authorizing Provider  albuterol (VENTOLIN HFA) 108 (90 Base) MCG/ACT inhaler Inhale 2 puffs into the lungs every 6 (six) hours as needed for wheezing or shortness of breath. 07/02/21  Yes [provider]  amiodarone (PACERONE) 200 MG tablet Take 1 tablet (200 mg total) by mouth daily. 03/11/23  Yes Andrey Farmer, PA-C  apixaban (ELIQUIS) 5 MG TABS tablet Take 1 tablet (5 mg total) by mouth 2 (two) times daily. 10/07/22  Yes Milford, Anderson Malta, FNP  Budeson-Glycopyrrol-Formoterol (BREZTRI AEROSPHERE) 160-9-4.8 MCG/ACT AERO Inhale 2 puffs into the lungs in the morning and at bedtime. 01/26/23  Yes Omar Person, MD  Cholecalciferol (VITAMIN D-3) 125 MCG (5000 UT) TABS Take 5,000 Units by mouth daily.   Yes [provider]  cyanocobalamin (VITAMIN B12) 1000 MCG tablet Take 1,000 mcg by mouth daily.   Yes [provider]  hydrALAZINE (APRESOLINE) 50 MG tablet Take 0.5 tablets (25 mg total) by mouth 3 (three) times  daily. 01/24/23  Yes Zannie Cove, MD  insulin NPH Human (NOVOLIN N) 100 UNIT/ML injection Inject 35-40 Units into the skin See admin instructions. Inject 40 units into the skin in the morning and 35 units at night 09/08/19  Yes [provider]  insulin regular (NOVOLIN R) 100 units/mL injection Inject 6-15 Units into  the skin 3 (three) times daily before meals. Per sliding scale 09/08/19  Yes [provider]  isosorbide mononitrate (IMDUR) 30 MG 24 hr tablet Take 1 tablet (30 mg total) by mouth daily. 12/04/22  Yes Bensimhon, Bevelyn Buckles, MD  LINZESS 72 MCG capsule Take 72 mcg by mouth every morning. 02/09/23  Yes [provider]  Melatonin 10 MG TABS Take 10 mg by mouth at bedtime.   Yes [provider]  milrinone (PRIMACOR) 20 MG/100 ML SOLN infusion Inject 0.01 mg/min into the vein continuous. [facility] 03/04/23  Yes Zannie Cove, MD  pantoprazole (PROTONIX) 40 MG tablet Take 40 mg by mouth daily.   Yes [provider]  potassium chloride SA (KLOR-CON M) 20 MEQ tablet Take 2 tablets (40 mEq total) by mouth daily. 03/26/23 04/29/24 Yes Bensimhon, Bevelyn Buckles, MD  rosuvastatin (CRESTOR) 20 MG tablet Take 1 tablet by mouth once daily Patient taking differently: Take 20 mg by mouth at bedtime. 08/18/22  Yes Nahser, Deloris Ping, MD  torsemide (DEMADEX) 20 MG tablet Take 2 tablets (40 mg total) by mouth 2 (two) times daily. 03/26/23  Yes Bensimhon, Bevelyn Buckles, MD  nitroGLYCERIN (NITROSTAT) 0.4 MG SL tablet Place 1 tablet (0.4 mg total) under the tongue every 5 (five) minutes as needed for chest pain. Patient not taking: Reported on 05/06/2023 08/31/20   Nahser, Deloris Ping, MD    Past Medical History: Past Medical History:  Diagnosis Date   Acute hypoxemic respiratory failure (HCC) 01/20/2023   Acute kidney injury superimposed on chronic kidney disease (HCC) 10/19/2022   Acute on chronic systolic CHF (congestive heart failure) (HCC) 01/21/2023   Arthritis    CAD (coronary artery disease)    Carotid artery occlusion    Diabetes mellitus    fasting 100-120s   Dizziness    Dysrhythmia    skips beats   GERD (gastroesophageal reflux disease)    History of kidney stones    Hyperlipidemia    Hypertension    IHD (ischemic heart disease)    Prior CABG in 2003   Normal nuclear  stress test 03/2011   Peripheral vascular disease (HCC)    Prostate cancer (HCC)    Torn rotator cuff     Past Surgical History: Past Surgical History:  Procedure Laterality Date   ABDOMINAL AORTAGRAM N/A 12/21/2013   Procedure: ABDOMINAL Ronny Flurry;  Surgeon: Sherren Kerns, MD;  Location: [facility] CATH LAB;  Service: Cardiovascular;  Laterality: N/A;   ABDOMINAL AORTOGRAM N/A 11/13/2017   Procedure: ABDOMINAL AORTOGRAM;  Surgeon: Sherren Kerns, MD;  Location: [facility] INVASIVE CV LAB;  Service: Cardiovascular;  Laterality: N/A;   APPENDECTOMY     ARCH AORTOGRAM  08/26/2013   Procedure: ARCH AORTOGRAM;  Surgeon: Sherren Kerns, MD;  Location: [facility] CATH LAB;  Service: Cardiovascular;;   CARDIAC CATHETERIZATION  05/03/2002   EF 40-45%   CARDIOVASCULAR STRESS TEST  08/10/2006   EF 65%   CARDIOVERSION N/A 10/22/2022   Procedure: CARDIOVERSION;  Surgeon: Dolores Patty, MD;  Location: [facility] ENDOSCOPY;  Service: Cardiovascular;  Laterality: N/A;   CARDIOVERSION N/A 02/23/2023   Procedure: CARDIOVERSION;  Surgeon: Dolores Patty,  MD;  Location: [facility] INVASIVE CV LAB;  Service: Cardiovascular;  Laterality: N/A;   CAROTID ENDARTERECTOMY     CAROTID STENT INSERTION Left 08/26/2013   Procedure: CAROTID STENT INSERTION;  Surgeon: Sherren Kerns, MD;  Location: [facility] CATH LAB;  Service: Cardiovascular;  Laterality: Left;  internal carotid   CORONARY ARTERY BYPASS GRAFT  04/2002   DOPPLER ECHOCARDIOGRAPHY  08/12/2002   EF 80-85%   ENDARTERECTOMY Left 07/20/2013   Procedure: ATTEMPTED ENDARTERECTOMY CAROTID;  Surgeon: Fransisco Hertz, MD;  Location: [facility] OR;  Service: Vascular;  Laterality: Left;   HERNIA REPAIR     IR FLUORO GUIDE CV LINE RIGHT  03/03/2023   IR US GUIDE VASC ACCESS RIGHT  03/03/2023   LOWER EXTREMITY ANGIOGRAPHY  11/13/2017   Procedure: Lower Extremity Angiography;  Surgeon: Sherren Kerns, MD;  Location: [facility] INVASIVE CV LAB;  Service: Cardiovascular;;   PERIPHERAL VASCULAR INTERVENTION Right  11/13/2017   Procedure: PERIPHERAL VASCULAR INTERVENTION;  Surgeon: Sherren Kerns, MD;  Location: [facility] INVASIVE CV LAB;  Service: Cardiovascular;  Laterality: Right;  superficial femoral   RIGHT HEART CATH N/A 09/23/2022   Procedure: RIGHT HEART CATH;  Surgeon: Dolores Patty, MD;  Location: [facility] INVASIVE CV LAB;  Service: Cardiovascular;  Laterality: N/A;   RIGHT HEART CATH N/A 02/18/2023   Procedure: RIGHT HEART CATH;  Surgeon: Laurey Morale, MD;  Location: [facility] INVASIVE CV LAB;  Service: Cardiovascular;  Laterality: N/A;   SHOULDER SURGERY     TEE WITHOUT CARDIOVERSION N/A 10/22/2022   Procedure: TRANSESOPHAGEAL ECHOCARDIOGRAM (TEE);  Surgeon: Dolores Patty, MD;  Location: [facility] ENDOSCOPY;  Service: Cardiovascular;  Laterality: N/A;   TEE WITHOUT CARDIOVERSION N/A 02/23/2023   Procedure: TRANSESOPHAGEAL ECHOCARDIOGRAM;  Surgeon: Dolores Patty, MD;  Location: [facility] INVASIVE CV LAB;  Service: Cardiovascular;  Laterality: N/A;    Family History: Family History  Problem Relation Age of Onset   Heart attack Mother    Diabetes Mother    Heart disease Mother    Hypertension Mother    Heart attack Father    Heart disease Father    Hypertension Father    Diabetes Brother    Diabetes Sister    Diabetes Daughter    Heart disease Daughter    Hypertension Daughter    Diabetes Sister    Breast cancer Neg Hx    Prostate cancer Neg Hx    Colon cancer Neg Hx    Pancreatic cancer Neg Hx     Social History: Social History   Socioeconomic History   Marital status: Married    Spouse name: Not on file   Number of children: 2   Years of education: Not on file   Highest education level: Not on file  Occupational History   Not on file  Tobacco Use   Smoking status: Former    Packs/day: 1.00    Years: 20.00    Additional pack years: 0.00    Total pack years: 20.00    Types: Cigarettes    Quit date: 10/27/2001    Years since quitting: 21.5    Passive exposure: Never   Smokeless  tobacco: Never  Vaping Use   Vaping Use: Never used  Substance and Sexual Activity   Alcohol use: No    Alcohol/week: 0.0 standard drinks of alcohol   Drug use: No   Sexual activity: Not Currently  Other Topics Concern   Not on file  Social History Narrative   Not on file   Social  Determinants of Health   Financial Resource Strain: Low Risk  (02/24/2023)   Overall Financial Resource Strain (CARDIA)    Difficulty of Paying Living Expenses: Not very hard  Food Insecurity: No Food Insecurity (02/24/2023)   Hunger Vital Sign    Worried About Running Out of Food in the Last Year: Never true    Ran Out of Food in the Last Year: Never true  Transportation Needs: No Transportation Needs (02/24/2023)   PRAPARE - Administrator, Civil Service (Medical): No    Lack of Transportation (Non-Medical): No  Physical Activity: Not on file  Stress: Not on file  Social Connections: Not on file    Allergies:  Allergies  Allergen Reactions   Aldactone [Spironolactone] Other (See Comments)    Swollen breast   Lipitor [Atorvastatin Calcium] Other (See Comments)    Muscle ache   Codeine Rash   Sulfa Drugs Cross Reactors Other (See Comments)    "raw spots"   Sulfamethoxazole-Trimethoprim Rash    Other reaction(s): Rash    Objective:    Vital Signs:   Temp:  [97.5 F (36.4 C)-98.2 F (36.8 C)] 97.5 F (36.4 C) (07/04 0645) Pulse Rate:  [62-113] 65 (07/04 0830) Resp:  [16-28] 22 (07/04 0830) BP: (78-139)/(42-70) 124/44 (07/04 0830) SpO2:  [95 %-100 %] 100 % (07/04 0830) Weight:  [77 kg-78.8 kg] 78.8 kg (07/04 0216)    Weight change: Filed Weights   05/06/2023 0215 05/17/2023 0216  Weight: 77 kg 78.8 kg    Intake/Output:   Intake/Output Summary (Last 24 hours) at 04/27/2023 0900 Last data filed at 05/01/2023 0558 Gross per 24 hour  Intake 150 ml  Output 200 ml  Net -50 ml      Physical Exam    General:  Acute on chronically ill.  HEENT: normal Neck: supple. JVP to  jaw . Carotids 2+ bilat; no bruits.  Cor: PMI nondisplaced. Regular rate & rhythm. No rubs, gallops or murmurs. Lungs: bibasilar rales Abdomen: soft, nontender, + distended.  Extremities: no cyanosis, clubbing, rash, 2+  Neuro: alert & orientedx3. Affect pleasant   Telemetry   SR 60s  EKG    Afib 105 bpm, inferior ST elevation  Labs   Basic Metabolic Panel: Recent Labs  Lab 05/12/2023 0217 05/03/2023 0224  NA 130* 132*  132*  K 4.9 4.8  4.8  CL 97* 102  CO2 13*  --   GLUCOSE 149* 145*  BUN 73* 72*  CREATININE 3.32* 3.40*  CALCIUM 9.7  --     Liver Function Tests: Recent Labs  Lab 05/23/2023 0217  AST 87*  ALT 57*  ALKPHOS 102  BILITOT 1.0  PROT 8.0  ALBUMIN 3.6   No results for input(s): "LIPASE", "AMYLASE" in the last 168 hours. No results for input(s): "AMMONIA" in the last 168 hours.  CBC: Recent Labs  Lab 05/11/2023 0217 04/27/2023 0224  WBC 10.8*  --   NEUTROABS 9.1*  --   HGB 11.5* 14.3  13.6  HCT 38.8* 42.0  40.0  MCV 87.0  --   PLT 318  --     Cardiac Enzymes: Recent Labs  Lab 05/26/2023 0217  CKTOTAL 251    BNP: BNP (last 3 results) Recent Labs    02/02/23 1130 02/10/23 1302 05/21/2023 0217  BNP 2,275.8* 2,508.7* 4,447.1*    ProBNP (last 3 results) No results for input(s): "PROBNP" in the last 8760 hours.   CBG: Recent Labs  Lab 05/23/2023 0650  GLUCAP 145*  Coagulation Studies: Recent Labs    05/06/2023 0217  LABPROT 28.6*  INR 2.7*     Imaging   CT HEAD WO CONTRAST ( )  Result Date: 05/02/2023 CLINICAL DATA:  Head trauma, moderate to severe. Syncopal episode this followed by vomiting EXAM: CT HEAD WITHOUT CONTRAST TECHNIQUE: Contiguous axial images were obtained from the base of the skull through the vertex without intravenous contrast. RADIATION DOSE REDUCTION: This exam was performed according to the departmental dose-optimization program which includes automated exposure control, adjustment of the mA and/or kV  according to patient size and/or use of iterative reconstruction technique. COMPARISON:  09/10/2020 FINDINGS: Brain: No evidence of acute infarction, hemorrhage, hydrocephalus, extra-axial collection or mass lesion/mass effect. Generalized cerebral volume loss. Mild-to-moderate chronic small vessel ischemia in the cerebral white matter. Vascular: No hyperdense vessel or unexpected calcification. Skull: Normal. Negative for fracture or focal lesion. Sinuses/Orbits: No acute finding. IMPRESSION: Aging brain without acute or reversible finding. Electronically Signed   By: Tiburcio Pea M.D.   On: 05/18/2023 04:19   DG Chest Port 1 View  Result Date: 05/17/2023 CLINICAL DATA:  Shortness of breath EXAM: PORTABLE CHEST 1 VIEW COMPARISON:  02/13/2023 FINDINGS: Cardiomegaly. Right IJ approached central venous catheter tip at the cavoatrial junction. No pneumothorax or sizable pleural effusion. Left mediastinal surgical clips. Remote median sternotomy. No focal consolidation. IMPRESSION: Cardiomegaly without overt pulmonary edema or focal consolidation. Electronically Signed   By: Deatra Robinson M.D.   On: 04/28/2023 02:45     Medications:     Current Medications:  aspirin EC  81 mg Oral Daily   Budeson-Glycopyrrol-Formoterol  2 puff Inhalation BID   cholecalciferol  5,000 Units Oral Daily   cyanocobalamin  1,000 mcg Oral Daily   insulin aspart  0-9 Units Subcutaneous TID WC   melatonin  10 mg Oral QHS   pantoprazole  40 mg Oral Daily   potassium chloride SA  40 mEq Oral Daily   sodium chloride flush  3 mL Intravenous Q12H    Infusions:  sodium chloride 10 mL/hr at 04/27/2023 0326   sodium chloride     amiodarone 60 mg/hr (05/05/2023 0544)   amiodarone     heparin     milrinone        Assessment/Plan   1. Inferior STEMI - Hx CAD s/p CABG in 2003 - Echo 03/24 c/w inferior/inferolateral MI. Managed medically - Now presenting with inferior ST elevations and HS troponin > 7K. Medical management  opted d/t end-stage advanced heart failure at baseline and CKD.  2. Acute on chronic systolic CHF >> Cardiogenic shock - Echo 01/02/23 EF 35-40%, WMA consistent with inferior/inferolateral MI, mild AS/mod AI mild MR - Admit 04/24 with low-output HF. Echo 04/24: EF 30%, RV moderately reduced, severely elevated RVSP, moderate MR. RHC with stable CO on milrinone 0.25. Failed milrinone wean. - Had initially thought reduced EF d/t AF but recent echocardiograms demonstrated WMA concerning for SVG failure. No LHC d/t CKD - Has been on home palliative inotrope with milrinone 0.125. - Now presenting with cardiogenic shock in setting of inferior STEMI and recurrent atrial fibrillation. His lactic acid was 7 >>>5.  - Unfortunately we do not have any other options to improve his situation. He is at peace with this and wants to transition to comfort measures and go home with hospice. Will give him 120 mg IV lasix for palliation. Continue milrinone infusion until he gets home.    3. AFL/AFib - 10/22/22 TEE/DC-CV ---> SR - 04/24 TEE/DC-CV --->  SR - Afib with RVR on presentation. Converted to SR with IV amiodarone. Can continue IV amiodarone while here. He will likely have worsening dyspnea with recurrent atrial fibrillation.   5. AKI on CKD 3b/IV - Scr up to 3 today in setting of shock   GOC: He is DNR. As above he wishes to transition to comfort care with home hospice. Can continue milrinone until he gets home.  Hopefully we can find a hospice agency to accept him today. CM working on placement.  Length of Stay: 0  FINCH, LINDSAY N, PA-C  05/16/2023, 9:00 AM  Advanced Heart Failure Team Pager [phone] (M-F; 7a - 5p)  Please contact [facility] for night-coverage after hours (4p -7a ) and weekends on [email or web address]   Patient seen with PA, agree with the above note.   He is on palliative home milrinone due to end stage ischemic cardiomyopathy and has been DNR.  He developed weakness and dyspnea yesterday,  no chest pain but had 2 episodes of transient syncope.  He came to the ER yesterday, had inferior ST elevation and was in atrial fibrillation with mild RVR (had been in NSR when last seen).  HS-TnI elevated to 7000 range.  Lactate elevated with AKI.  Decision was made not to take to cath lab with end stage heart failure and no chest pain.  He converted to NSR on amiodarone gtt.   This morning, he is short of breath and orthopneic.  No chest pain.  He remains on milrinone 0.25 and is on amiodarone 30 mg/hr.  Heparin gtt ordered but cannot be given due to lack of IV access.   General: NAD Neck: JVP 16 cm, no thyromegaly or thyroid nodule.  Lungs: Clear to auscultation bilaterally with normal respiratory effort. CV: Nondisplaced PMI.  Heart regular S1/S2, no S3/S4, no murmur.  1+ ankle edema.  Abdomen: Soft, nontender, no hepatosplenomegaly, no distention.  Skin: Intact without lesions or rashes.  Neurologic: Alert and oriented x 3.  Psych: Normal affect. Extremities: No clubbing or cyanosis.  HEENT: Normal.   Patient has had goals of care discussion this morning with me and with Dr. Jerral Ralph.  He is clear that he wants to proceed with hospice/comfort care.  This is very reasonable given end stage HF failing palliative home milrinone.  He is volume overloaded though not having chest pain at this time. He wants to get home with hospice.  I suspect that he will survive < 24 hrs once his milrinone is stopped.  - Would continue IV amiodarone and milrinone until he gets home with hospice care in place, then can stop.  - Hospice has been consulted and is working on coming to his house today when he is discharged.  - I will give him Lasix 120 mg IV x 1 now to make his breathing more comfortable.   Marca Ancona 05/17/2023 9:54 AM ]

## 2023-05-28 NOTE — Progress Notes (Signed)
PT Cancellation Note  Patient Details Name: [name] MRN: [id number] DOB: 08/15/1946   Cancelled Treatment:    Reason Eval/Treat Not Completed: Other (comment) Per chart, family opted for home with hospice without hospital admission this morning. Discussed case with MD, who recommends PT hold for now.   Nedra Hai, PT, DPT 05/01/2023 8:48 AM

## 2023-05-28 NOTE — ED Notes (Signed)
Daughter to nurses station states pt blood sugar is 244, says he is on a sliding scale.

## 2023-05-28 NOTE — H&P (Signed)
History and Physical    Patient: [name] WJX:[id number] DOB: June 25, 1946 DOA: 05/05/2023 DOS: the patient was seen and examined on 05/05/2023 PCP: Gaspar Garbe, MD  Patient coming from: Home  Chief Complaint:  Chief Complaint  Patient presents with   Code STEMI   HPI: [name] is a 77 y.o. male with medical history significant of CAD s/p CABG in 2020, end-stage HFrEF on palliative milrinone infusion at home, CKD stage IIIb-who presented to the hospital early this morning with x 2 syncopal episode.  Per patient-for the past 24 hours also-he has been having exertional dyspnea-more than his usual baseline.  Apparently patient woke up around 2 AM this morning and became much more short of breath and could not lie flat.  He then proceeded to have 2 episodes of syncope without any prodrome.  Daughter-who is a nurse-witness both these episodes-no seizure-like activity-no prodrome-lasted for a few minutes and he recovered spontaneously.  He was subsequently brought to the ED by EMS-where he was found to have A-fib with RVR and a STEMI in the inferior leads.  He was evaluated by interventional cardiology-and not felt to be a candidate for PCI.  The hospitalist service was then asked to admit this patient for further evaluation and treatment.  No headache No chest pain Some nausea No vomiting No diarrhea No abdominal pain  Review of Systems: As mentioned in the history of present illness. All other systems reviewed and are negative. Past Medical History:  Diagnosis Date   Acute hypoxemic respiratory failure (HCC) 01/20/2023   Acute kidney injury superimposed on chronic kidney disease (HCC) 10/19/2022   Acute on chronic systolic CHF (congestive heart failure) (HCC) 01/21/2023   Arthritis    CAD (coronary artery disease)    Carotid artery occlusion    Diabetes mellitus    fasting 100-120s   Dizziness    Dysrhythmia    skips beats   GERD (gastroesophageal reflux disease)     History of kidney stones    Hyperlipidemia    Hypertension    IHD (ischemic heart disease)    Prior CABG in 2003   Normal nuclear stress test 03/2011   Peripheral vascular disease (HCC)    Prostate cancer (HCC)    Torn rotator cuff    Past Surgical History:  Procedure Laterality Date   ABDOMINAL AORTAGRAM N/A 12/21/2013   Procedure: ABDOMINAL Ronny Flurry;  Surgeon: Sherren Kerns, MD;  Location: [facility] CATH LAB;  Service: Cardiovascular;  Laterality: N/A;   ABDOMINAL AORTOGRAM N/A 11/13/2017   Procedure: ABDOMINAL AORTOGRAM;  Surgeon: Sherren Kerns, MD;  Location: [facility] INVASIVE CV LAB;  Service: Cardiovascular;  Laterality: N/A;   APPENDECTOMY     ARCH AORTOGRAM  08/26/2013   Procedure: ARCH AORTOGRAM;  Surgeon: Sherren Kerns, MD;  Location: [facility] CATH LAB;  Service: Cardiovascular;;   CARDIAC CATHETERIZATION  05/03/2002   EF 40-45%   CARDIOVASCULAR STRESS TEST  08/10/2006   EF 65%   CARDIOVERSION N/A 10/22/2022   Procedure: CARDIOVERSION;  Surgeon: Dolores Patty, MD;  Location: [facility] ENDOSCOPY;  Service: Cardiovascular;  Laterality: N/A;   CARDIOVERSION N/A 02/23/2023   Procedure: CARDIOVERSION;  Surgeon: Dolores Patty, MD;  Location: [facility] INVASIVE CV LAB;  Service: Cardiovascular;  Laterality: N/A;   CAROTID ENDARTERECTOMY     CAROTID STENT INSERTION Left 08/26/2013   Procedure: CAROTID STENT INSERTION;  Surgeon: Sherren Kerns, MD;  Location: [facility] CATH LAB;  Service: Cardiovascular;  Laterality: Left;  internal carotid  CORONARY ARTERY BYPASS GRAFT  04/2002   DOPPLER ECHOCARDIOGRAPHY  08/12/2002   EF 80-85%   ENDARTERECTOMY Left 07/20/2013   Procedure: ATTEMPTED ENDARTERECTOMY CAROTID;  Surgeon: Fransisco Hertz, MD;  Location: [facility] OR;  Service: Vascular;  Laterality: Left;   HERNIA REPAIR     IR FLUORO GUIDE CV LINE RIGHT  03/03/2023   IR US GUIDE VASC ACCESS RIGHT  03/03/2023   LOWER EXTREMITY ANGIOGRAPHY  11/13/2017   Procedure: Lower Extremity Angiography;  Surgeon: Sherren Kerns, MD;  Location: [facility] INVASIVE CV LAB;  Service: Cardiovascular;;   PERIPHERAL VASCULAR INTERVENTION Right 11/13/2017   Procedure: PERIPHERAL VASCULAR INTERVENTION;  Surgeon: Sherren Kerns, MD;  Location: [facility] INVASIVE CV LAB;  Service: Cardiovascular;  Laterality: Right;  superficial femoral   RIGHT HEART CATH N/A 09/23/2022   Procedure: RIGHT HEART CATH;  Surgeon: Dolores Patty, MD;  Location: [facility] INVASIVE CV LAB;  Service: Cardiovascular;  Laterality: N/A;   RIGHT HEART CATH N/A 02/18/2023   Procedure: RIGHT HEART CATH;  Surgeon: Laurey Morale, MD;  Location: [facility] INVASIVE CV LAB;  Service: Cardiovascular;  Laterality: N/A;   SHOULDER SURGERY     TEE WITHOUT CARDIOVERSION N/A 10/22/2022   Procedure: TRANSESOPHAGEAL ECHOCARDIOGRAM (TEE);  Surgeon: Dolores Patty, MD;  Location: [facility] ENDOSCOPY;  Service: Cardiovascular;  Laterality: N/A;   TEE WITHOUT CARDIOVERSION N/A 02/23/2023   Procedure: TRANSESOPHAGEAL ECHOCARDIOGRAM;  Surgeon: Dolores Patty, MD;  Location: [facility] INVASIVE CV LAB;  Service: Cardiovascular;  Laterality: N/A;   Social History:  reports that he quit smoking about 21 years ago. His smoking use included cigarettes. He has a 20.00 pack-year smoking history. He has never been exposed to tobacco smoke. He has never used smokeless tobacco. He reports that he does not drink alcohol and does not use drugs.  Allergies  Allergen Reactions   Aldactone [Spironolactone] Other (See Comments)    Swollen breast   Lipitor [Atorvastatin Calcium] Other (See Comments)    Muscle ache   Codeine Rash   Sulfa Drugs Cross Reactors Other (See Comments)    "raw spots"   Sulfamethoxazole-Trimethoprim Rash    Other reaction(s): Rash    Family History  Problem Relation Age of Onset   Heart attack Mother    Diabetes Mother    Heart disease Mother    Hypertension Mother    Heart attack Father    Heart disease Father    Hypertension Father    Diabetes Brother    Diabetes Sister     Diabetes Daughter    Heart disease Daughter    Hypertension Daughter    Diabetes Sister    Breast cancer Neg Hx    Prostate cancer Neg Hx    Colon cancer Neg Hx    Pancreatic cancer Neg Hx     Prior to Admission medications   Medication Sig Start Date End Date Taking? Authorizing Provider  albuterol (VENTOLIN HFA) 108 (90 Base) MCG/ACT inhaler Inhale 2 puffs into the lungs every 6 (six) hours as needed for wheezing or shortness of breath. 07/02/21  Yes [provider]  amiodarone (PACERONE) 200 MG tablet Take 1 tablet (200 mg total) by mouth daily. 03/11/23  Yes Andrey Farmer, PA-C  apixaban (ELIQUIS) 5 MG TABS tablet Take 1 tablet (5 mg total) by mouth 2 (two) times daily. 10/07/22  Yes Milford, Anderson Malta, FNP  Budeson-Glycopyrrol-Formoterol (BREZTRI AEROSPHERE) 160-9-4.8 MCG/ACT AERO Inhale 2 puffs into the lungs in the morning and at bedtime. 01/26/23  Yes Omar Person, MD  Cholecalciferol (VITAMIN D-3) 125 MCG (5000 UT) TABS Take 5,000 Units by mouth daily.   Yes [provider]  cyanocobalamin (VITAMIN B12) 1000 MCG tablet Take 1,000 mcg by mouth daily.   Yes [provider]  hydrALAZINE (APRESOLINE) 50 MG tablet Take 0.5 tablets (25 mg total) by mouth 3 (three) times daily. 01/24/23  Yes Zannie Cove, MD  insulin NPH Human (NOVOLIN N) 100 UNIT/ML injection Inject 35-40 Units into the skin See admin instructions. Inject 40 units into the skin in the morning and 35 units at night 09/08/19  Yes [provider]  insulin regular (NOVOLIN R) 100 units/mL injection Inject 6-15 Units into the skin 3 (three) times daily before meals. Per sliding scale 09/08/19  Yes [provider]  isosorbide mononitrate (IMDUR) 30 MG 24 hr tablet Take 1 tablet (30 mg total) by mouth daily. 12/04/22  Yes Bensimhon, Bevelyn Buckles, MD  LINZESS 72 MCG capsule Take 72 mcg by mouth every morning. 02/09/23  Yes [provider]  Melatonin 10 MG TABS Take 10 mg by  mouth at bedtime.   Yes [provider]  milrinone (PRIMACOR) 20 MG/100 ML SOLN infusion Inject 0.01 mg/min into the vein continuous. [facility] 03/04/23  Yes Zannie Cove, MD  pantoprazole (PROTONIX) 40 MG tablet Take 40 mg by mouth daily.   Yes [provider]  potassium chloride SA (KLOR-CON M) 20 MEQ tablet Take 2 tablets (40 mEq total) by mouth daily. 03/26/23 04/29/24 Yes Bensimhon, Bevelyn Buckles, MD  rosuvastatin (CRESTOR) 20 MG tablet Take 1 tablet by mouth once daily Patient taking differently: Take 20 mg by mouth at bedtime. 08/18/22  Yes Nahser, Deloris Ping, MD  torsemide (DEMADEX) 20 MG tablet Take 2 tablets (40 mg total) by mouth 2 (two) times daily. 03/26/23  Yes Bensimhon, Bevelyn Buckles, MD  nitroGLYCERIN (NITROSTAT) 0.4 MG SL tablet Place 1 tablet (0.4 mg total) under the tongue every 5 (five) minutes as needed for chest pain. Patient not taking: Reported on 05/09/2023 08/31/20   Nahser, Deloris Ping, MD    Physical Exam: Vitals:   05/22/2023 0600 05/04/2023 0615 05/08/2023 0630 05/20/2023 0645  BP: (!) 124/47 (!) 121/52 (!) 129/48 (!) 102/54  Pulse: 67 65 65 99  Resp: 18 20 20 18   Temp:    (!) 97.5 F (36.4 C)  TempSrc:    Oral  SpO2: 99% 99% 99% 98%  Weight:      Height:       Gen Exam:Alert awake-not in any distress-unable to lie flat. HEENT:atraumatic, normocephalic ++ JVD Chest: Clear to auscultation CVS:S1S2 regular-+ systolic murmur Abdomen:soft non tender, non distended Extremities:++ edema Neurology: Non focal Skin: no rash   Data Reviewed:     Latest Ref Rng & Units 05/14/2023    2:24 AM 04/27/2023    2:17 AM 04/07/2023   11:24 AM  CBC  WBC 4.0 - 10.5 K/uL  10.8  6.0   Hemoglobin 13.0 - 17.0 g/dL 16.1 - 09.6 g/dL 04.5    40.9  81.1  91.4   Hematocrit 39.0 - 52.0 % 39.0 - 52.0 % 40.0    42.0  38.8  34.5   Platelets 150 - 400 K/uL  318  228         Latest Ref Rng & Units 05/13/2023    2:24 AM 05/22/2023    2:17 AM 04/22/2023   11:51 AM  BMP   Glucose 70 - 99 mg/dL 782  149  116   BUN 8 - 23 mg/dL 72  73  63   Creatinine 0.61 - 1.24 mg/dL 1.61  0.96  0.45   BUN/Creat Ratio 10 - 24   29   Sodium 135 - 145 mmol/L 135 - 145 mmol/L 132    132  130  138   Potassium 3.5 - 5.1 mmol/L 3.5 - 5.1 mmol/L 4.8    4.8  4.9  3.8   Chloride 98 - 111 mmol/L 102  97  101   CO2 22 - 32 mmol/L  13  20   Calcium 8.9 - 10.3 mg/dL  9.7  8.9      Assessment and Plan: STEMI Not a candidate for PCI or any other of intervention-please see interventional cardiology note We will plan on treatment with aspirin/statin/IV heparin Hold off on beta-blocker-given soft blood pressure-risk for cardiogenic shock See goals of care discussion below  Syncope X 2 earlier this morning at home-no prodrome ?  VT Amiodarone infusion Telemetry monitoring DNR-no defibrillation  Acute on chronic hypoxic respiratory failure (on 2-3 L of oxygen at home) Suspect this is from acute on chronic HFpEF Improving with diuretics  Acute on chronic HFpEF (felt to have end-stage heart failure-not a candidate for advanced heart failure therapies) Presented with 1 day history of worsening exertional dyspnea-currently with orthopnea-but appears comfortable Given IV Lasix earlier this morning-and feeling better Continue milrinone Not a candidate for advanced heart failure therapies  A-fib RVR Amiodarone infusion IV heparin for now-holding Eliquis  AKI on CKD stage IIIb AKI hemodynamically mediated in the setting of cardiorenal syndrome Avoid nephrotoxic agents Not on dialysis/CRRT candidate  DM-2 SSI for now Add long-acting regimen depending on how his sugars do  GERD PPI  COPD Not in exacerbation Bronchodilators  Goals of care/palliative care DNR in place Long discussion with patient-and daughter at bedside (RN)-regarding whether to transition to comfort measures now versus continuing IV amiodarone/milrinone for another day or so to see how he does.  Family  discussion in progress.  Both patient/daughter aware that there really is no role for any further escalation in care and that if he deteriorates-apart from starting comfort measures-no other options available.  Both patient/family agreeable to starting low-dose IV Dilaudid if he gets into distress/for comfort.   Advance Care Planning:   Code Status: DNR   Consults: Cardiology, palliative care  Family Communication: Daughter at bedside  Severity of Illness: The appropriate patient status for this patient is INPATIENT. Inpatient status is judged to be reasonable and necessary in order to provide the required intensity of service to ensure the patient's safety. The patient's presenting symptoms, physical exam findings, and initial radiographic and laboratory data in the context of their chronic comorbidities is felt to place them at high risk for further clinical deterioration. Furthermore, it is not anticipated that the patient will be medically stable for discharge from the hospital within 2 midnights of admission.   * I certify that at the point of admission it is my clinical judgment that the patient will require inpatient hospital care spanning beyond 2 midnights from the point of admission due to high intensity of service, high risk for further deterioration and high frequency of surveillance required.*  Author: Jeoffrey Massed, MD 05/02/2023 7:58 AM  For on call review [email or web address].

## 2023-05-28 NOTE — Progress Notes (Signed)
ANTICOAGULATION CONSULT NOTE - Initial Consult  Pharmacy Consult for heparin Indication: chest pain/ACS  Allergies  Allergen Reactions   Aldactone [Spironolactone] Other (See Comments)    Swollen breast   Lipitor [Atorvastatin Calcium] Other (See Comments)    Muscle ache   Codeine Rash   Sulfa Drugs Cross Reactors Other (See Comments)    "raw spots"   Sulfamethoxazole-Trimethoprim Rash    Other reaction(s): Rash    Patient Measurements: Height: 5\' 10"  (177.8 cm) Weight: 78.8 kg (173 lb 12.8 oz) IBW/kg (Calculated) : 73 Heparin Dosing Weight: TBW  Vital Signs: Temp: 97.5 F (36.4 C) (07/04 0645) Temp Source: Oral (07/04 0645) BP: 102/54 (07/04 0645) Pulse Rate: 99 (07/04 0645)  Labs: Recent Labs    05/21/2023 0217 05/09/2023 0224 05/26/2023 0443  HGB 11.5* 14.3  13.6  --   HCT 38.8* 42.0  40.0  --   PLT 318  --   --   APTT 42*  --   --   LABPROT 28.6*  --   --   INR 2.7*  --   --   CREATININE 3.32* 3.40*  --   CKTOTAL 251  --   --   TROPONINIHS 7,551*  --  5,269*    Estimated Creatinine Clearance: 19.4 mL/min (A) (by C-G formula based on SCr of 3.4 mg/dL (H)).   Medical History: Past Medical History:  Diagnosis Date   Acute hypoxemic respiratory failure (HCC) 01/20/2023   Acute kidney injury superimposed on chronic kidney disease (HCC) 10/19/2022   Acute on chronic systolic CHF (congestive heart failure) (HCC) 01/21/2023   Arthritis    CAD (coronary artery disease)    Carotid artery occlusion    Diabetes mellitus    fasting 100-120s   Dizziness    Dysrhythmia    skips beats   GERD (gastroesophageal reflux disease)    History of kidney stones    Hyperlipidemia    Hypertension    IHD (ischemic heart disease)    Prior CABG in 2003   Normal nuclear stress test 03/2011   Peripheral vascular disease (HCC)    Prostate cancer (HCC)    Torn rotator cuff      Assessment: 76 YOM presenting as code STEMI, was given 4000 units heparin IV at 0200 per MD, hx  of afib on Eliquis PTA with last dose 7/3 @1900 , no emergent cath per cards  Goal of Therapy:  Heparin level 0.3-0.7 units/ml aPTT 66-102 seconds Monitor platelets by anticoagulation protocol: Yes   Plan:  Heparin gtt at 1000 units/hr F/u 8 hour heparin level F/u cards plan and LOT  Daylene Posey, PharmD, [facility] Clinical Pharmacist ED Pharmacist Phone # [phone] 05/23/2023 8:14 AM

## 2023-05-28 NOTE — ED Notes (Signed)
Pt awoke and had a syncopal episode then began to vomit. Feels generally uncomfortable, no chest pain. EKG by EMS +STEMI. Pt with hx of CHF, on Milrinone and 3L [place] chronically. Given 324mg  ASA by EMS

## 2023-05-28 NOTE — ED Notes (Signed)
When pt's heart rhythm changes, his work of breathing increases and pt feels generally unwell. Cardiology fellow notified.

## 2023-05-28 DEATH — deceased

## 2023-06-09 ENCOUNTER — Ambulatory Visit: Payer: Medicare HMO

## 2023-06-09 ENCOUNTER — Encounter (HOSPITAL_COMMUNITY): Payer: Medicare HMO

## 2023-07-01 ENCOUNTER — Ambulatory Visit: Payer: Medicare HMO | Admitting: Podiatry

## 2023-07-07 ENCOUNTER — Encounter (HOSPITAL_COMMUNITY): Payer: Medicare HMO | Admitting: Internal Medicine

## 2024-01-29 IMAGING — DX DG CHEST 2V
2 series · 2 of 2 positions shown · non-contrast
Comparison: 06/15/2021

CLINICAL DATA: Dyspnea

EXAM:
CHEST - 2 VIEW

[chest pa]
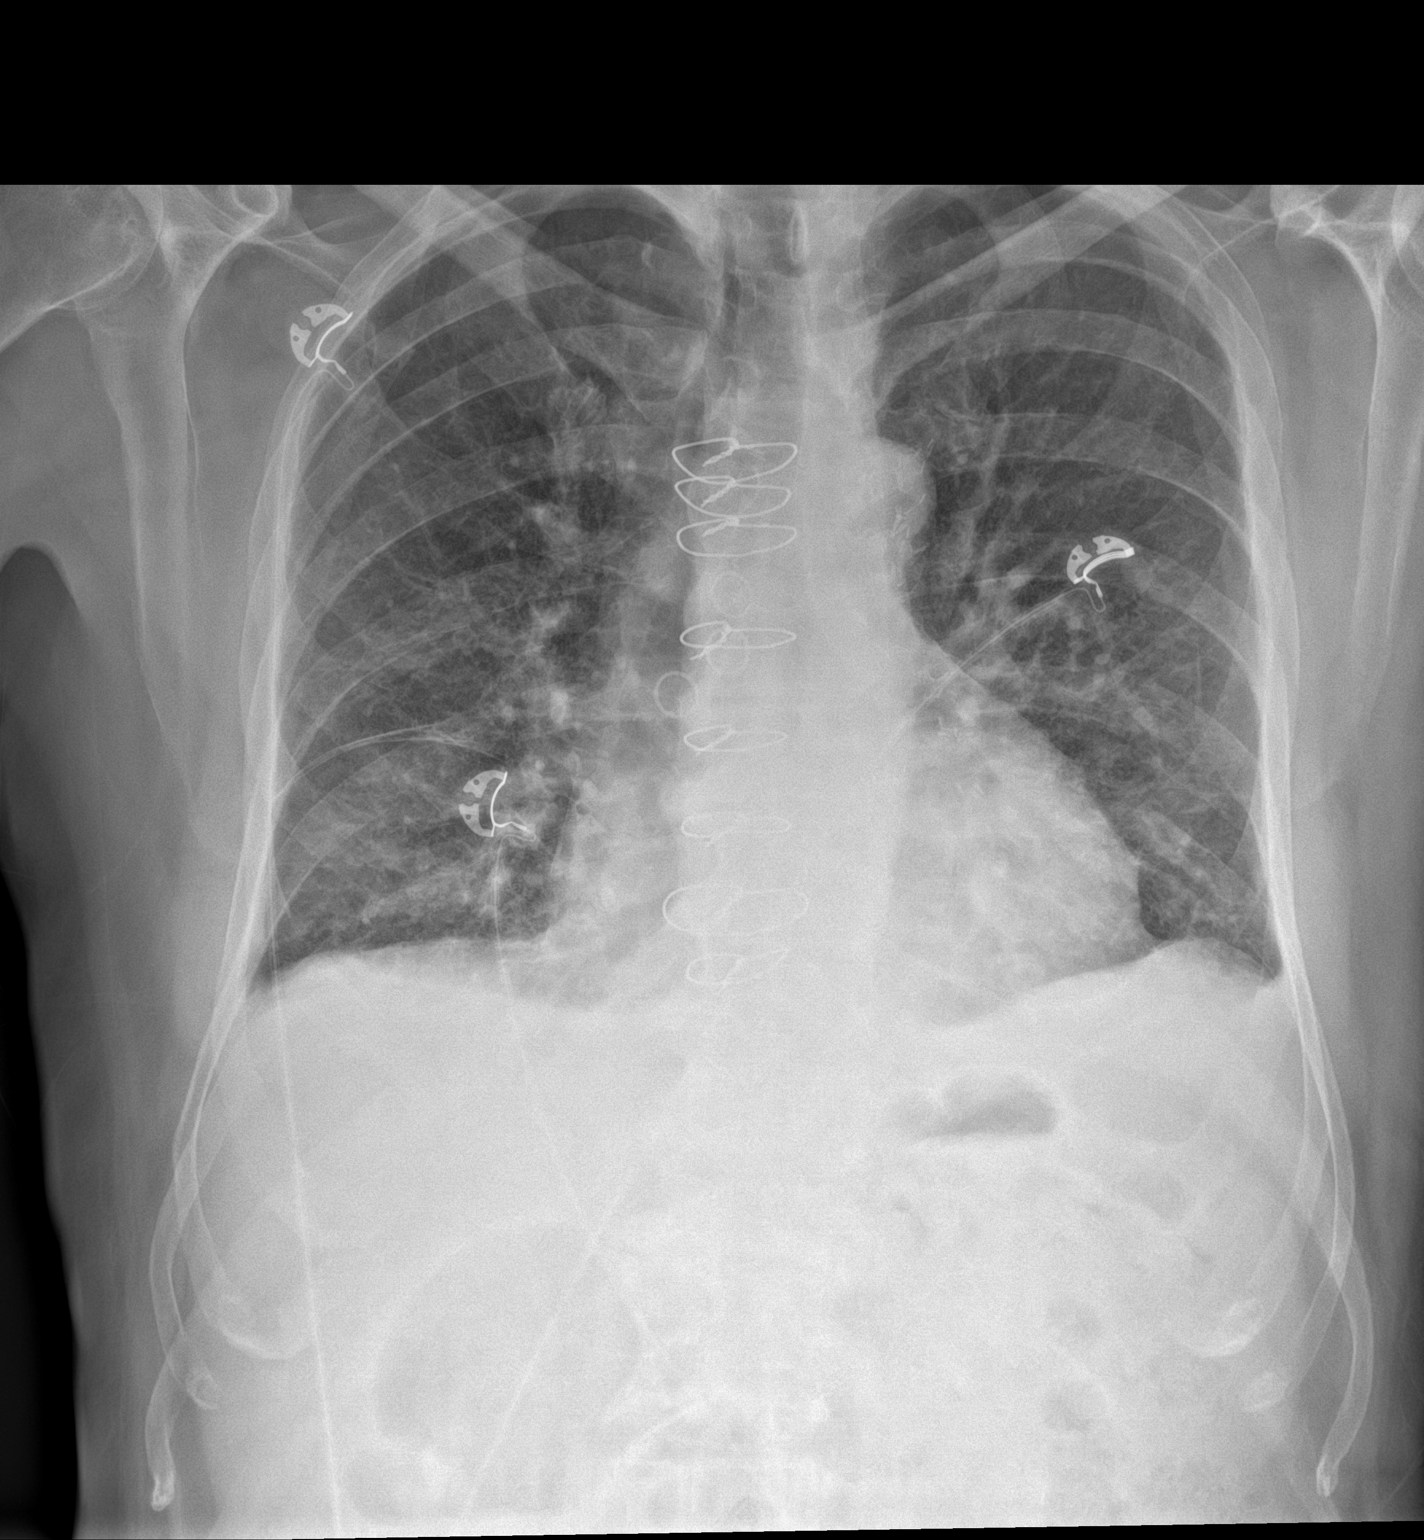

[chest lat]
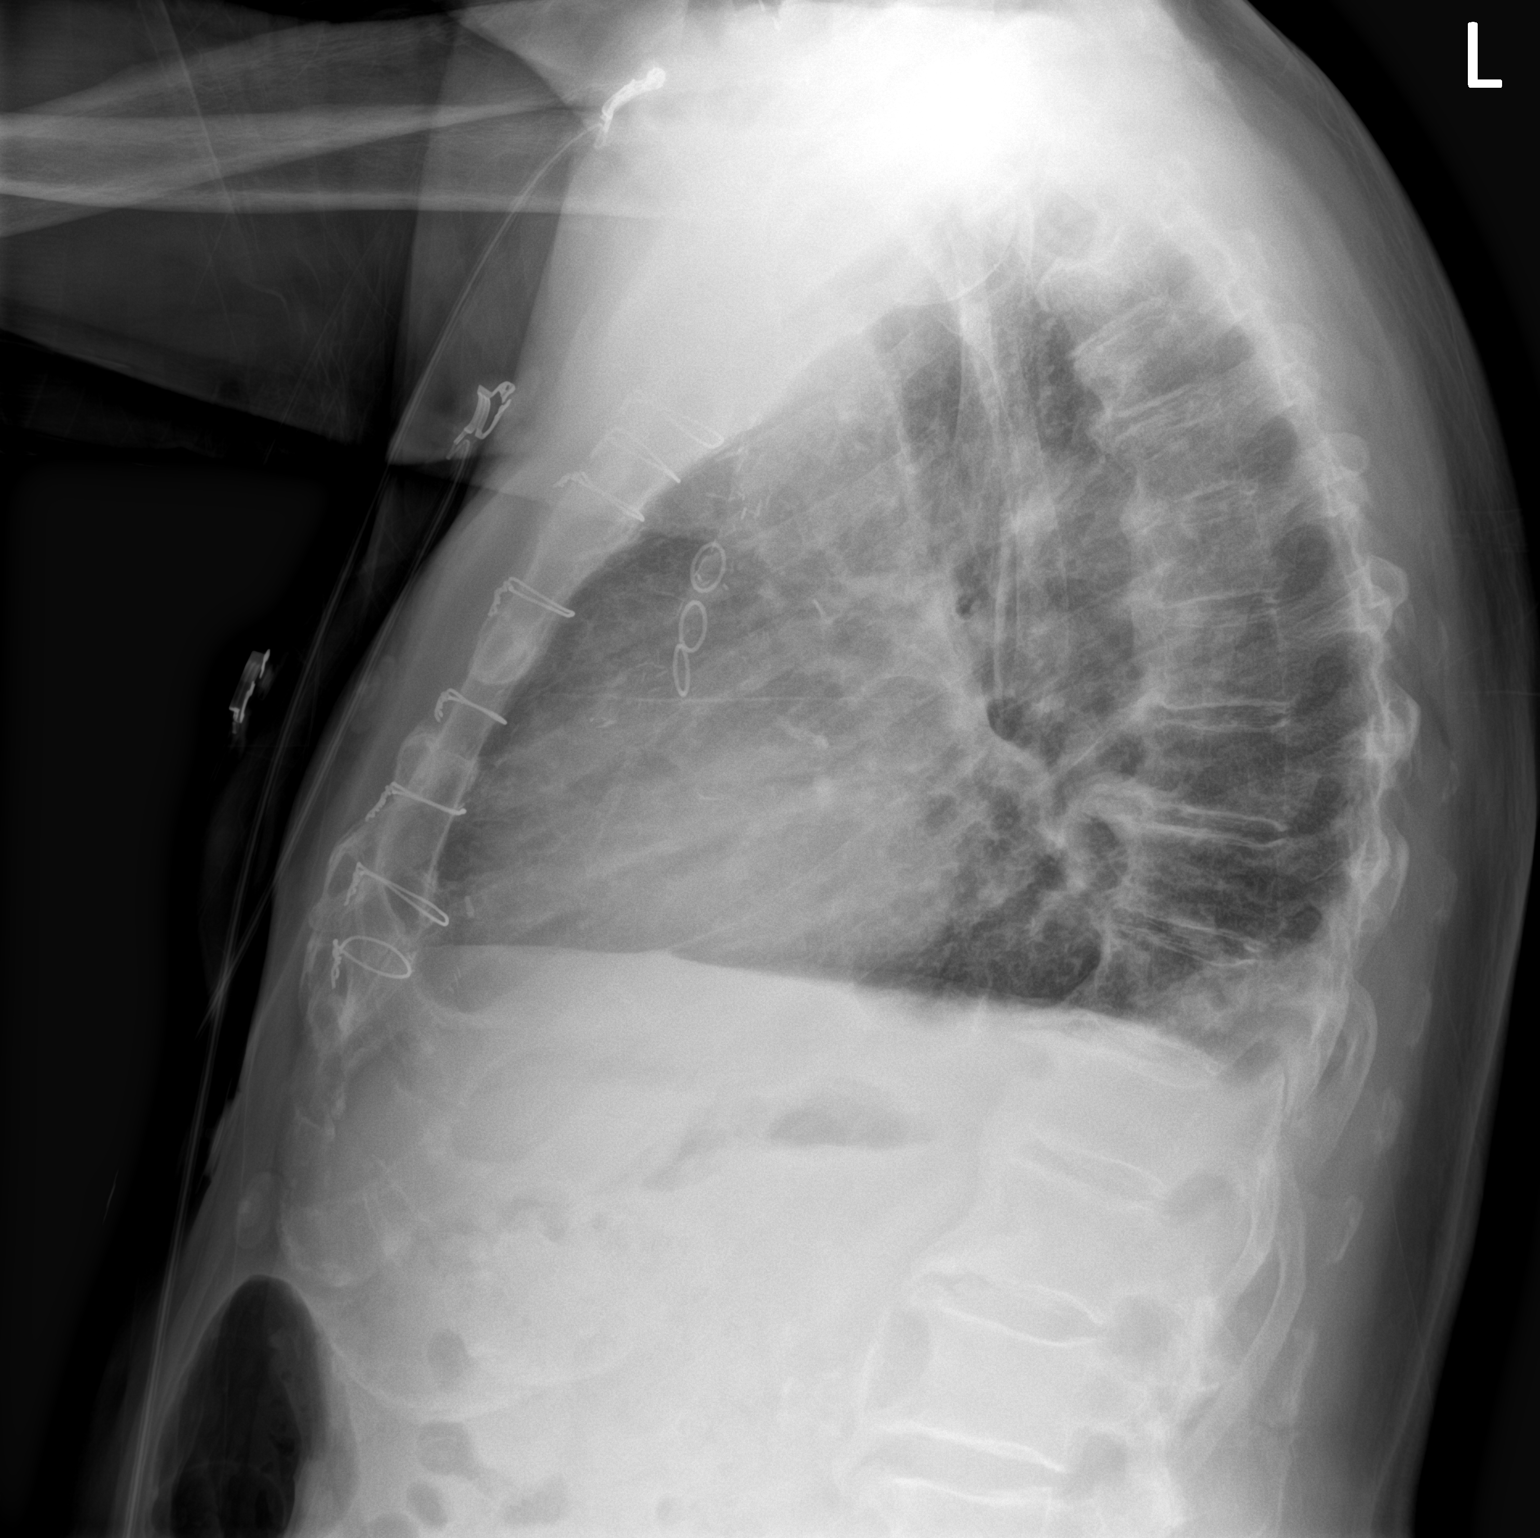

[2 of 2 positions shown; findings below may reference images not displayed]

FINDINGS: Pulmonary insufflation has slightly diminished since prior
examination, however, insufflation is normal and symmetric. Small
bilateral pleural effusions have developed. Mild bibasilar
atelectasis. No pneumothorax. Coronary artery bypass grafting has
been performed. Cardiac size within normal limits. Pulmonary
vascularity is normal.
IMPRESSION: Small bilateral pleural effusions with associated minimal bibasilar
atelectasis.
